# Patient Record
Sex: Female | Born: 1935 | Race: White | Hispanic: No | Smoking: Former smoker
Health system: Southern US, Community
[De-identification: ages and names within clinical notes are randomized; demographics above are authoritative.]

## PROBLEM LIST (undated history)

## (undated) DIAGNOSIS — K219 Gastro-esophageal reflux disease without esophagitis: Secondary | ICD-10-CM

## (undated) DIAGNOSIS — M129 Arthropathy, unspecified: Secondary | ICD-10-CM

## (undated) DIAGNOSIS — H3530 Unspecified macular degeneration: Secondary | ICD-10-CM

## (undated) DIAGNOSIS — H35039 Hypertensive retinopathy, unspecified eye: Secondary | ICD-10-CM

## (undated) DIAGNOSIS — R42 Dizziness and giddiness: Secondary | ICD-10-CM

## (undated) DIAGNOSIS — N183 Chronic kidney disease, stage 3 unspecified: Secondary | ICD-10-CM

## (undated) DIAGNOSIS — I471 Supraventricular tachycardia, unspecified: Secondary | ICD-10-CM

## (undated) DIAGNOSIS — I10 Essential (primary) hypertension: Secondary | ICD-10-CM

## (undated) DIAGNOSIS — G4700 Insomnia, unspecified: Secondary | ICD-10-CM

## (undated) DIAGNOSIS — E039 Hypothyroidism, unspecified: Secondary | ICD-10-CM

## (undated) DIAGNOSIS — I493 Ventricular premature depolarization: Secondary | ICD-10-CM

## (undated) DIAGNOSIS — I491 Atrial premature depolarization: Secondary | ICD-10-CM

## (undated) DIAGNOSIS — R4189 Other symptoms and signs involving cognitive functions and awareness: Secondary | ICD-10-CM

## (undated) DIAGNOSIS — R413 Other amnesia: Secondary | ICD-10-CM

## (undated) DIAGNOSIS — R001 Bradycardia, unspecified: Secondary | ICD-10-CM

## (undated) DIAGNOSIS — M539 Dorsopathy, unspecified: Secondary | ICD-10-CM

## (undated) DIAGNOSIS — S72009A Fracture of unspecified part of neck of unspecified femur, initial encounter for closed fracture: Secondary | ICD-10-CM

## (undated) DIAGNOSIS — E785 Hyperlipidemia, unspecified: Secondary | ICD-10-CM

## (undated) DIAGNOSIS — R002 Palpitations: Secondary | ICD-10-CM

## (undated) DIAGNOSIS — F329 Major depressive disorder, single episode, unspecified: Secondary | ICD-10-CM

## (undated) DIAGNOSIS — D649 Anemia, unspecified: Secondary | ICD-10-CM

## (undated) DIAGNOSIS — E559 Vitamin D deficiency, unspecified: Secondary | ICD-10-CM

## (undated) HISTORY — DX: Anemia, unspecified: D64.9

## (undated) HISTORY — DX: Arthropathy, unspecified: M12.9

## (undated) HISTORY — PX: CATARACT EXTRACTION: SUR2

## (undated) HISTORY — DX: Fracture of unspecified part of neck of unspecified femur, initial encounter for closed fracture: S72.009A

## (undated) HISTORY — DX: Atrial premature depolarization: I49.1

## (undated) HISTORY — DX: Hypothyroidism, unspecified: E03.9

## (undated) HISTORY — DX: Insomnia, unspecified: G47.00

## (undated) HISTORY — DX: Other amnesia: R41.3

## (undated) HISTORY — DX: Bradycardia, unspecified: R00.1

## (undated) HISTORY — DX: Major depressive disorder, single episode, unspecified: F32.9

## (undated) HISTORY — PX: C-EYE SURGERY PROCEDURE: 102257504

## (undated) HISTORY — DX: Hyperlipidemia, unspecified: E78.5

## (undated) HISTORY — PX: EYE SURGERY: SHX253

## (undated) HISTORY — DX: Chronic kidney disease, stage 3 unspecified: N18.30

## (undated) HISTORY — DX: Supraventricular tachycardia, unspecified: I47.10

## (undated) HISTORY — DX: Hypertensive retinopathy, unspecified eye: H35.039

## (undated) HISTORY — DX: Palpitations: R00.2

## (undated) HISTORY — DX: Other symptoms and signs involving cognitive functions and awareness: R41.89

## (undated) HISTORY — DX: Gastro-esophageal reflux disease without esophagitis: K21.9

## (undated) HISTORY — DX: Supraventricular tachycardia: I47.1

## (undated) HISTORY — DX: Unspecified macular degeneration: H35.30

## (undated) HISTORY — DX: Dizziness and giddiness: R42

## (undated) HISTORY — DX: Ventricular premature depolarization: I49.3

## (undated) HISTORY — DX: Vitamin D deficiency, unspecified: E55.9

## (undated) HISTORY — DX: Dorsopathy, unspecified: M53.9

---

## 1969-01-03 HISTORY — PX: ABDOMINAL HYSTERECTOMY: SHX81

## 2008-01-04 HISTORY — PX: BLADDER SURGERY: SHX569

## 2013-01-03 HISTORY — PX: OTHER SURGICAL HISTORY: SHX169

## 2013-01-14 DIAGNOSIS — M25519 Pain in unspecified shoulder: Secondary | ICD-10-CM | POA: Diagnosis not present

## 2013-02-07 DIAGNOSIS — Z85828 Personal history of other malignant neoplasm of skin: Secondary | ICD-10-CM | POA: Diagnosis not present

## 2013-02-07 DIAGNOSIS — Z85820 Personal history of malignant melanoma of skin: Secondary | ICD-10-CM | POA: Diagnosis not present

## 2013-04-09 DIAGNOSIS — R7309 Other abnormal glucose: Secondary | ICD-10-CM | POA: Diagnosis not present

## 2013-04-09 DIAGNOSIS — E7800 Pure hypercholesterolemia, unspecified: Secondary | ICD-10-CM | POA: Diagnosis not present

## 2013-04-09 DIAGNOSIS — I10 Essential (primary) hypertension: Secondary | ICD-10-CM | POA: Diagnosis not present

## 2013-04-16 DIAGNOSIS — E7800 Pure hypercholesterolemia, unspecified: Secondary | ICD-10-CM | POA: Diagnosis not present

## 2013-04-16 DIAGNOSIS — E119 Type 2 diabetes mellitus without complications: Secondary | ICD-10-CM | POA: Diagnosis not present

## 2013-04-16 DIAGNOSIS — I10 Essential (primary) hypertension: Secondary | ICD-10-CM | POA: Diagnosis not present

## 2013-05-04 ENCOUNTER — Emergency Department (HOSPITAL_COMMUNITY): Payer: Medicare Other

## 2013-05-04 ENCOUNTER — Encounter (HOSPITAL_COMMUNITY): Payer: Self-pay | Admitting: Emergency Medicine

## 2013-05-04 ENCOUNTER — Inpatient Hospital Stay (HOSPITAL_COMMUNITY)
Admission: EM | Admit: 2013-05-04 | Discharge: 2013-05-07 | DRG: 536 | Disposition: A | Discharge status: Skilled Nursing Facility | Payer: Medicare Other | Attending: Internal Medicine | Admitting: Internal Medicine

## 2013-05-04 DIAGNOSIS — IMO0001 Reserved for inherently not codable concepts without codable children: Secondary | ICD-10-CM | POA: Diagnosis not present

## 2013-05-04 DIAGNOSIS — S8990XA Unspecified injury of unspecified lower leg, initial encounter: Secondary | ICD-10-CM | POA: Diagnosis not present

## 2013-05-04 DIAGNOSIS — S322XXA Fracture of coccyx, initial encounter for closed fracture: Secondary | ICD-10-CM | POA: Diagnosis present

## 2013-05-04 DIAGNOSIS — Z7982 Long term (current) use of aspirin: Secondary | ICD-10-CM | POA: Diagnosis not present

## 2013-05-04 DIAGNOSIS — M79609 Pain in unspecified limb: Secondary | ICD-10-CM | POA: Diagnosis not present

## 2013-05-04 DIAGNOSIS — F411 Generalized anxiety disorder: Secondary | ICD-10-CM | POA: Diagnosis not present

## 2013-05-04 DIAGNOSIS — R002 Palpitations: Secondary | ICD-10-CM

## 2013-05-04 DIAGNOSIS — Z79899 Other long term (current) drug therapy: Secondary | ICD-10-CM

## 2013-05-04 DIAGNOSIS — I4891 Unspecified atrial fibrillation: Secondary | ICD-10-CM | POA: Diagnosis not present

## 2013-05-04 DIAGNOSIS — S32601A Unspecified fracture of right ischium, initial encounter for closed fracture: Secondary | ICD-10-CM | POA: Diagnosis present

## 2013-05-04 DIAGNOSIS — M25473 Effusion, unspecified ankle: Secondary | ICD-10-CM | POA: Diagnosis not present

## 2013-05-04 DIAGNOSIS — R269 Unspecified abnormalities of gait and mobility: Secondary | ICD-10-CM | POA: Diagnosis not present

## 2013-05-04 DIAGNOSIS — K219 Gastro-esophageal reflux disease without esophagitis: Secondary | ICD-10-CM

## 2013-05-04 DIAGNOSIS — S3210XA Unspecified fracture of sacrum, initial encounter for closed fracture: Secondary | ICD-10-CM | POA: Diagnosis present

## 2013-05-04 DIAGNOSIS — S32509A Unspecified fracture of unspecified pubis, initial encounter for closed fracture: Secondary | ICD-10-CM | POA: Diagnosis not present

## 2013-05-04 DIAGNOSIS — E785 Hyperlipidemia, unspecified: Secondary | ICD-10-CM | POA: Diagnosis not present

## 2013-05-04 DIAGNOSIS — S32602A Unspecified fracture of left ischium, initial encounter for closed fracture: Secondary | ICD-10-CM | POA: Diagnosis present

## 2013-05-04 DIAGNOSIS — IMO0002 Reserved for concepts with insufficient information to code with codable children: Secondary | ICD-10-CM | POA: Diagnosis not present

## 2013-05-04 DIAGNOSIS — S32609A Unspecified fracture of unspecified ischium, initial encounter for closed fracture: Secondary | ICD-10-CM | POA: Diagnosis present

## 2013-05-04 DIAGNOSIS — S79919A Unspecified injury of unspecified hip, initial encounter: Secondary | ICD-10-CM | POA: Diagnosis not present

## 2013-05-04 DIAGNOSIS — M25476 Effusion, unspecified foot: Secondary | ICD-10-CM | POA: Diagnosis not present

## 2013-05-04 DIAGNOSIS — S72009A Fracture of unspecified part of neck of unspecified femur, initial encounter for closed fracture: Secondary | ICD-10-CM | POA: Diagnosis not present

## 2013-05-04 DIAGNOSIS — M6281 Muscle weakness (generalized): Secondary | ICD-10-CM | POA: Diagnosis not present

## 2013-05-04 DIAGNOSIS — Z9181 History of falling: Secondary | ICD-10-CM | POA: Diagnosis not present

## 2013-05-04 DIAGNOSIS — S329XXA Fracture of unspecified parts of lumbosacral spine and pelvis, initial encounter for closed fracture: Secondary | ICD-10-CM | POA: Diagnosis present

## 2013-05-04 DIAGNOSIS — Y93K1 Activity, walking an animal: Secondary | ICD-10-CM

## 2013-05-04 DIAGNOSIS — I10 Essential (primary) hypertension: Secondary | ICD-10-CM | POA: Diagnosis present

## 2013-05-04 DIAGNOSIS — M25559 Pain in unspecified hip: Secondary | ICD-10-CM | POA: Diagnosis not present

## 2013-05-04 DIAGNOSIS — S32592A Other specified fracture of left pubis, initial encounter for closed fracture: Secondary | ICD-10-CM | POA: Diagnosis present

## 2013-05-04 DIAGNOSIS — W108XXA Fall (on) (from) other stairs and steps, initial encounter: Secondary | ICD-10-CM | POA: Diagnosis present

## 2013-05-04 DIAGNOSIS — S298XXA Other specified injuries of thorax, initial encounter: Secondary | ICD-10-CM | POA: Diagnosis not present

## 2013-05-04 DIAGNOSIS — S32591A Other specified fracture of right pubis, initial encounter for closed fracture: Secondary | ICD-10-CM | POA: Diagnosis present

## 2013-05-04 HISTORY — DX: Essential (primary) hypertension: I10

## 2013-05-04 HISTORY — DX: Palpitations: R00.2

## 2013-05-04 HISTORY — DX: Gastro-esophageal reflux disease without esophagitis: K21.9

## 2013-05-04 LAB — CBC WITH DIFFERENTIAL/PLATELET
Basophils Absolute: 0 10*3/uL (ref 0.0–0.1)
Basophils Relative: 0 % (ref 0–1)
Eosinophils Absolute: 0.1 10*3/uL (ref 0.0–0.7)
Eosinophils Relative: 1 % (ref 0–5)
HEMATOCRIT: 32.3 % — AB (ref 36.0–46.0)
Hemoglobin: 11 g/dL — ABNORMAL LOW (ref 12.0–15.0)
LYMPHS PCT: 12 % (ref 12–46)
Lymphs Abs: 1.1 10*3/uL (ref 0.7–4.0)
MCH: 30 pg (ref 26.0–34.0)
MCHC: 34.1 g/dL (ref 30.0–36.0)
MCV: 88 fL (ref 78.0–100.0)
Monocytes Absolute: 0.6 10*3/uL (ref 0.1–1.0)
Monocytes Relative: 7 % (ref 3–12)
NEUTROS PCT: 80 % — AB (ref 43–77)
Neutro Abs: 7.4 10*3/uL (ref 1.7–7.7)
PLATELETS: 140 10*3/uL — AB (ref 150–400)
RBC: 3.67 MIL/uL — ABNORMAL LOW (ref 3.87–5.11)
RDW: 13.4 % (ref 11.5–15.5)
WBC: 9.1 10*3/uL (ref 4.0–10.5)

## 2013-05-04 LAB — BASIC METABOLIC PANEL
BUN: 20 mg/dL (ref 6–23)
CHLORIDE: 102 meq/L (ref 96–112)
CO2: 26 meq/L (ref 19–32)
CREATININE: 0.93 mg/dL (ref 0.50–1.10)
Calcium: 9.5 mg/dL (ref 8.4–10.5)
GFR calc non Af Amer: 54 mL/min — ABNORMAL LOW (ref >90)
GFR, EST AFRICAN AMERICAN: 63 mL/min — AB (ref >90)
Glucose, Bld: 100 mg/dL — ABNORMAL HIGH (ref 70–99)
POTASSIUM: 4.1 meq/L (ref 3.7–5.3)
SODIUM: 140 meq/L (ref 137–147)

## 2013-05-04 MED ORDER — HYDROCODONE-ACETAMINOPHEN 5-325 MG PO TABS
1.0000 | ORAL_TABLET | Freq: Four times a day (QID) | ORAL | Status: DC | PRN
  Administered 2013-05-04 – 2013-05-06 (×6): 2 via ORAL
  Filled 2013-05-04 (×7): qty 2

## 2013-05-04 MED ORDER — LISINOPRIL-HYDROCHLOROTHIAZIDE 20-12.5 MG PO TABS: 1.0000 | ORAL_TABLET | Freq: Every day | ORAL | Status: DC

## 2013-05-04 MED ORDER — PANTOPRAZOLE SODIUM 40 MG PO TBEC
80.0000 mg | DELAYED_RELEASE_TABLET | Freq: Every day | ORAL | Status: DC
  Administered 2013-05-05 – 2013-05-07 (×3): 80 mg via ORAL
  Filled 2013-05-04 (×4): qty 2

## 2013-05-04 MED ORDER — HYDROCHLOROTHIAZIDE 12.5 MG PO CAPS
12.5000 mg | ORAL_CAPSULE | Freq: Every day | ORAL | Status: DC
Start: 2013-05-05 — End: 2013-05-07
  Administered 2013-05-05 – 2013-05-07 (×3): 12.5 mg via ORAL
  Filled 2013-05-04 (×3): qty 1

## 2013-05-04 MED ORDER — NIACIN ER (ANTIHYPERLIPIDEMIC) 500 MG PO TBCR
500.0000 mg | EXTENDED_RELEASE_TABLET | Freq: Every day | ORAL | Status: DC
  Administered 2013-05-05 – 2013-05-06 (×3): 500 mg via ORAL
  Filled 2013-05-04 (×4): qty 1

## 2013-05-04 MED ORDER — MORPHINE SULFATE 4 MG/ML IJ SOLN
4.0000 mg | Freq: Once | INTRAMUSCULAR | Status: AC
  Administered 2013-05-04: 4 mg via INTRAVENOUS
  Filled 2013-05-04: qty 1

## 2013-05-04 MED ORDER — LISINOPRIL 20 MG PO TABS
20.0000 mg | ORAL_TABLET | Freq: Every day | ORAL | Status: DC
  Administered 2013-05-05 – 2013-05-07 (×3): 20 mg via ORAL
  Filled 2013-05-04 (×3): qty 1

## 2013-05-04 MED ORDER — COLESEVELAM HCL 3.75 G PO PACK
1.0000 | PACK | Freq: Every day | ORAL | Status: DC
  Administered 2013-05-06 – 2013-05-07 (×2): 1 via ORAL
  Filled 2013-05-04 (×4): qty 1

## 2013-05-04 MED ORDER — COLESEVELAM HCL 3.75 G PO PACK: 1.0000 | PACK | Freq: Every day | ORAL | Status: DC

## 2013-05-04 MED ORDER — MORPHINE SULFATE 2 MG/ML IJ SOLN
0.5000 mg | INTRAMUSCULAR | Status: DC | PRN
  Administered 2013-05-04 – 2013-05-06 (×4): 0.5 mg via INTRAVENOUS
  Filled 2013-05-04 (×4): qty 1

## 2013-05-04 MED ORDER — HEPARIN SODIUM (PORCINE) 5000 UNIT/ML IJ SOLN
5000.0000 [IU] | Freq: Three times a day (TID) | INTRAMUSCULAR | Status: DC
  Administered 2013-05-04 – 2013-05-07 (×8): 5000 [IU] via SUBCUTANEOUS
  Filled 2013-05-04 (×12): qty 1

## 2013-05-04 MED ORDER — ADULT MULTIVITAMIN W/MINERALS CH
1.0000 | ORAL_TABLET | Freq: Every day | ORAL | Status: DC
  Administered 2013-05-05 – 2013-05-07 (×3): 1 via ORAL
  Filled 2013-05-04 (×3): qty 1

## 2013-05-04 MED ORDER — ATENOLOL 50 MG PO TABS
50.0000 mg | ORAL_TABLET | Freq: Every day | ORAL | Status: DC
  Administered 2013-05-06 – 2013-05-07 (×2): 50 mg via ORAL
  Filled 2013-05-04 (×3): qty 1

## 2013-05-04 MED ORDER — CITALOPRAM HYDROBROMIDE 20 MG PO TABS
20.0000 mg | ORAL_TABLET | Freq: Every day | ORAL | Status: DC
  Administered 2013-05-05 – 2013-05-07 (×3): 20 mg via ORAL
  Filled 2013-05-04 (×3): qty 1

## 2013-05-04 MED ORDER — ASPIRIN EC 81 MG PO TBEC
81.0000 mg | DELAYED_RELEASE_TABLET | Freq: Every day | ORAL | Status: DC
  Administered 2013-05-05 – 2013-05-07 (×3): 81 mg via ORAL
  Filled 2013-05-04 (×3): qty 1

## 2013-05-04 NOTE — ED Notes (Signed)
Pt to department via PTAR- pt reports that she was walking a dog around the block. States that she tripped and fell on some steps. Pt reports left groin pain and increased pain with movement. No shortening or rotation noted. Denies any LOC. Bp-110/70 Hr-60 O2-97

## 2013-05-04 NOTE — ED Notes (Signed)
Attempted report 

## 2013-05-04 NOTE — ED Provider Notes (Signed)
CSN: [id number]     Arrival date & time 05/04/13  1652 History   First MD Initiated Contact with Patient 05/04/13 1654     Chief Complaint  Patient presents with  . Groin Pain  . Fall     (Consider location/radiation/quality/duration/timing/severity/associated sxs/prior Treatment) HPI  This is a 80 y.o. female with PMH of A. fib, hypertension, presenting with pain. Onset prior to arrival. Located medial aspect of the left thigh. Persistent. Crampy, paroxysmal. No meds taken. Radiates down the left upper leg. Negative for weakness, numbness, tingling.  Mechanism was mechanical fall from standing. Patient was walking down steps, missed last up. Felt to the left side of her body. Patient did not hit her head, neck, or back. Negative for LOC, amnesia. Positive for minimal blood loss from superficial abrasion to the left lateral malleolus.  Past Medical History  Diagnosis Date  . Hypertension   . A-fib    History reviewed. No pertinent past surgical history. History reviewed. No pertinent family history. History  Substance Use Topics  . Smoking status: Not on file  . Smokeless tobacco: Not on file  . Alcohol Use: Not on file   OB History   Grav Para Term Preterm Abortions TAB SAB Ect Mult Living                 Review of Systems  Constitutional: Negative for fever and chills.  HENT: Negative for facial swelling.   Eyes: Negative for photophobia and pain.  Respiratory: Negative for cough and shortness of breath.   Cardiovascular: Negative for chest pain and leg swelling.  Gastrointestinal: Negative for nausea, vomiting and abdominal pain.  Genitourinary: Negative for dysuria.  Musculoskeletal: Positive for arthralgias.  Skin: Positive for wound (superficial, left ankle). Negative for rash.  Neurological: Negative for seizures.  Hematological: Negative for adenopathy.      Allergies  Codeine  Home Medications   Prior to Admission medications   Not on File   BP  139/46  Pulse 59  Temp(Src) 98.9 F (37.2 C) (Oral)  Resp 18  SpO2 92% Physical Exam  Constitutional: She is oriented to person, place, and time. She appears well-developed and well-nourished. No distress.  HENT:  Head: Normocephalic and atraumatic.  Mouth/Throat: No oropharyngeal exudate.  Eyes: Conjunctivae are normal. Pupils are equal, round, and reactive to light. No scleral icterus.  Neck: Normal range of motion. No tracheal deviation present. No thyromegaly present.  Cardiovascular: Normal rate, regular rhythm and normal heart sounds.  Exam reveals no gallop and no friction rub.   No murmur heard. Pulmonary/Chest: Effort normal and breath sounds normal. No stridor. No respiratory distress. She has no wheezes. She has no rales. She exhibits no tenderness.  Abdominal: Soft. She exhibits no distension and no mass. There is no tenderness. There is no rebound and no guarding.  Musculoskeletal: Normal range of motion. She exhibits no edema.  Positive for tenderness to palpation of the left upper leg, pelvis. Negative for rotation or shortening. Negative for pain to the greater trochanter. Pelvis is stable to AP and lateral compression. Both extremities are neuro vascularly intact  Neurological: She is alert and oriented to person, place, and time.  Skin: Skin is warm and dry. She is not diaphoretic.  Superficial abrasion to left lateral malleolus    ED Course  Procedures (including critical care time) Labs Review Labs Reviewed - No data to display  Imaging Review Dg Chest 1 View  05/04/2013   CLINICAL DATA:  Fall.  EXAM: CHEST - 1 VIEW  COMPARISON:  None.  FINDINGS: Biapical pleural parenchymal thickening noted, as was consistent with scarring. Active disease including granulomatous disease cannot be excluded. No pleural effusion or pneumothorax. Borderline cardiomegaly, normal pulmonary vascularity. No focal bony abnormality.  IMPRESSION: 1. Apical pleural parenchymal thickening. This  is most consistent with scarring. Active apical disease including granulomatous disease cannot be excluded. There are no prior studies for comparison.  2.  No acute bony abnormality.  No pneumothorax.   Electronically Signed   By: Maisie Fus  Register   On: 05/04/2013 19:09   Dg Pelvis 1-2 Views  05/04/2013   CLINICAL DATA:  Fall.  EXAM: PELVIS - 1-2 VIEW  COMPARISON:  None.  FINDINGS: Degenerative changes lumbar spine and both hips. No acute bony or joint abnormality.  IMPRESSION: No acute abnormality.   Electronically Signed   By: Maisie Fus  Register   On: 05/04/2013 19:07   Dg Femur Left  05/04/2013   CLINICAL DATA:  Bilateral groin pain following a fall today.  EXAM: LEFT FEMUR - 2 VIEW  COMPARISON:  None.  FINDINGS: There is no evidence of fracture or other focal bone lesions. Anterior patellar spurs. Soft tissues are unremarkable.  IMPRESSION: No fracture or dislocation.   Electronically Signed   By: Gordan Payment M.D.   On: 05/04/2013 19:08   Dg Femur Right  05/04/2013   CLINICAL DATA:  Bilateral groin pain following a fall today.  EXAM: RIGHT FEMUR - 2 VIEW  COMPARISON:  None.  FINDINGS: There is no evidence of fracture or other focal bone lesions. Anterior patellar spurs. Soft tissues are unremarkable.  IMPRESSION: No fracture or dislocation.   Electronically Signed   By: Gordan Payment M.D.   On: 05/04/2013 19:08   Dg Ankle Complete Left  05/04/2013   CLINICAL DATA:  Left ankle swelling following a fall today.  EXAM: LEFT ANKLE COMPLETE - 3+ VIEW  COMPARISON:  None.  FINDINGS: Posterior soft tissue swelling and mild medial and lateral soft tissue swelling. There is also evidence of an effusion. No fracture or dislocation is seen. Moderate anterior and mild posterior calcaneal spur formation.  IMPRESSION: Soft tissue swelling and effusion without fracture.   Electronically Signed   By: Gordan Payment M.D.   On: 05/04/2013 19:10   Ct Pelvis Wo Contrast  05/04/2013   CLINICAL DATA:  Bilateral groin pain following  a fall.  EXAM: CT PELVIS WITHOUT CONTRAST  TECHNIQUE: Multidetector CT imaging of the pelvis was performed following the standard protocol without intravenous contrast.  COMPARISON:  Pelvis and bilateral femur radiographs obtained earlier today.  FINDINGS: There is a displaced fracture of the left inferior pubic ramus. There is a mildly displaced fracture of the left ischium, involving the anterior column of the acetabulum. There are also essentially nondisplaced fractures of the right inferior pubic ramus and right ischium as well as the right pubic body. There is also a mildly displaced fracture of the mid to left sacrum, anteriorly. The soft tissues are unremarkable.  Lower lumbar spine degenerative changes including fragmented posterior spur formation at the L5-S1 level. Atheromatous arterial calcifications. No bladder or intestinal abnormalities are seen. Surgically absent uterus. No adnexal masses or enlarged lymph nodes.  IMPRESSION: Fractures of both inferior pubic rami, both ischia, right pubic body and left sacrum, as described above.   Electronically Signed   By: Gordan Payment M.D.   On: 05/04/2013 21:34     EKG Interpretation   Date/Time:  Saturday May 04 2013 17:15:00 EDT Ventricular Rate:  60 PR Interval:  202 QRS Duration: 99 QT Interval:  440 QTC Calculation: 440 R Axis:   72 Text Interpretation:  Sinus rhythm RSR' in V1 or V2, right VCD or RVH No  old tracing to compare Confirmed by KNAPP  MD-J, JON ([id number]) on 05/04/2013  5:18:40 PM      MDM   Final diagnoses:  None    This is a 80 y.o. female with PMH of A. fib, hypertension, presenting with pain. Onset prior to arrival. Located medial aspect of the left thigh. Persistent. Crampy, paroxysmal. No meds taken. Radiates down the left upper leg. Negative for weakness, numbness, tingling.  Mechanism was mechanical fall from standing. Patient was walking down steps, missed last up. Felt to the left side of her body. Patient did not  hit her head, neck, or back. Negative for LOC, amnesia. Positive for minimal blood loss from superficial abrasion to the left lateral malleolus.   X-ray of the chest is without acute bony abnormality or pneumothorax. Femur, pelvis x-rays are without acute traumatic injury.  Patient is still in significant pain. Considering age, likely osteoporosis, trauma, concerning pain, I believe that CT scan of the pelvis is indicated to rule out fracture. Will followup results.  CT of the pelvis reveals fractures of both inferior pubic rami, both fascia, right pubic body and left sacrum. Patient reports good analgesia with morphine. Have contacted the hospitalist service for admission. Bilateral lower extremities remained neurovascularly intact.    I've consulted orthopedic surgery.  I appreciate their recommendations.  Patient is being admitted in stable condition.  I have discussed case and care has been guided by my attending physician, Dr. Lynelle DoctorKnapp.    Loma BostonStirling Danne Scardina, MD 05/04/13 [phone]

## 2013-05-04 NOTE — H&P (Signed)
[facility] History and Physical  Tracey Rosario ZOX:[id number] DOB: 1926-03-10 DOA: 05/04/2013  Referring physician: EDP PCP: No PCP Per Patient   Chief Complaint: Fall   HPI: Tracey Rosario is a 80 y.o. female with PMH of HTN and A.Fib who presents with pelvic pain after a fall.  Pain is persistent since the fall, worst is the medial aspect of the left thigh.  Fall occurred when walking down steps, she was walking her dog, she missed the last step and fell on to the left side of her body.  Did not hit head nor neck.  No LOC  Review of Systems: Systems reviewed.  As above, otherwise negative  Past Medical History  Diagnosis Date  . Hypertension   . A-fib    History reviewed. No pertinent past surgical history. Social History:  has no tobacco, alcohol, and drug history on file.  Allergies  Allergen Reactions  . Codeine Other (See Comments)    Makes her crazy    History reviewed. No pertinent family history.   Prior to Admission medications   Medication Sig Start Date End Date Taking? Authorizing Provider  aspirin 81 MG tablet Take 81 mg by mouth daily.   Yes Historical Provider, MD  atenolol (TENORMIN) 50 MG tablet Take 50 mg by mouth daily.   Yes Historical Provider, MD  citalopram (CELEXA) 20 MG tablet Take 20 mg by mouth daily.   Yes Historical Provider, MD  Colesevelam HCl [facility]) 3.75 G PACK Take 1 packet by mouth daily.   Yes Historical Provider, MD  esomeprazole (NEXIUM) 40 MG capsule Take 40 mg by mouth daily at 12 noon.   Yes Historical Provider, MD  lisinopril-hydrochlorothiazide (PRINZIDE,ZESTORETIC) 20-12.5 MG per tablet Take 1 tablet by mouth daily.   Yes Historical Provider, MD  Multiple Vitamin (MULTIVITAMIN WITH MINERALS) TABS tablet Take 1 tablet by mouth daily.   Yes Historical Provider, MD  niacin (NIASPAN) 500 MG CR tablet Take 500 mg by mouth at bedtime.   Yes Historical Provider, MD  Vitamin D, Ergocalciferol, (DRISDOL) 50000 UNITS CAPS capsule  Take 50,000 Units by mouth every 7 (seven) days.   Yes Historical Provider, MD   Physical Exam: Filed Vitals:   05/04/13 2200  BP: 148/43  Pulse: 61  Temp:   Resp:     BP 148/43  Pulse 61  Temp(Src) 98.9 F (37.2 C) (Oral)  Resp 18  SpO2 98%  General Appearance:    Alert, oriented, no distress, appears stated age  Head:    Normocephalic, atraumatic  Eyes:    PERRL, EOMI, sclera non-icteric        Nose:   Nares without drainage or epistaxis. Mucosa, turbinates normal  Throat:   Moist mucous membranes. Oropharynx without erythema or exudate.  Neck:   Supple. No carotid bruits.  No thyromegaly.  No lymphadenopathy.   Back:     No CVA tenderness, no spinal tenderness  Lungs:     Clear to auscultation bilaterally, without wheezes, rhonchi or rales  Chest wall:    No tenderness to palpitation  Heart:    Regular rate and rhythm without murmurs, gallops, rubs  Abdomen:     Soft, non-tender, nondistended, normal bowel sounds, no organomegaly  Genitalia:    deferred  Rectal:    deferred  Extremities:   No clubbing, cyanosis or edema.  Pulses:   2+ and symmetric all extremities  Skin:   Skin color, texture, turgor normal, no rashes or lesions  Lymph nodes:  Cervical, supraclavicular, and axillary nodes normal  Neurologic:   CNII-XII intact. Normal strength, sensation and reflexes      throughout    Labs on Admission:  Basic Metabolic Panel:  Recent Labs Lab 05/04/13 1850  NA 140  K 4.1  CL 102  CO2 26  GLUCOSE 100*  BUN 20  CREATININE 0.93  CALCIUM 9.5   Liver Function Tests: No results found for this basename: AST, ALT, ALKPHOS, BILITOT, PROT, ALBUMIN,  in the last 168 hours No results found for this basename: LIPASE, AMYLASE,  in the last 168 hours No results found for this basename: AMMONIA,  in the last 168 hours CBC:  Recent Labs Lab 05/04/13 1850  WBC 9.1  NEUTROABS 7.4  HGB 11.0*  HCT 32.3*  MCV 88.0  PLT 140*   Cardiac Enzymes: No results found  for this basename: CKTOTAL, CKMB, CKMBINDEX, TROPONINI,  in the last 168 hours  BNP (last 3 results) No results found for this basename: PROBNP,  in the last 8760 hours CBG: No results found for this basename: GLUCAP,  in the last 168 hours  Radiological Exams on Admission: Dg Chest 1 View  05/04/2013   CLINICAL DATA:  Fall.  EXAM: CHEST - 1 VIEW  COMPARISON:  None.  FINDINGS: Biapical pleural parenchymal thickening noted, as was consistent with scarring. Active disease including granulomatous disease cannot be excluded. No pleural effusion or pneumothorax. Borderline cardiomegaly, normal pulmonary vascularity. No focal bony abnormality.  IMPRESSION: 1. Apical pleural parenchymal thickening. This is most consistent with scarring. Active apical disease including granulomatous disease cannot be excluded. There are no prior studies for comparison.  2.  No acute bony abnormality.  No pneumothorax.   Electronically Signed   By: Maisie Fushomas  Register   On: 05/04/2013 19:09   Dg Pelvis 1-2 Views  05/04/2013   CLINICAL DATA:  Fall.  EXAM: PELVIS - 1-2 VIEW  COMPARISON:  None.  FINDINGS: Degenerative changes lumbar spine and both hips. No acute bony or joint abnormality.  IMPRESSION: No acute abnormality.   Electronically Signed   By: Maisie Fushomas  Register   On: 05/04/2013 19:07   Dg Femur Left  05/04/2013   CLINICAL DATA:  Bilateral groin pain following a fall today.  EXAM: LEFT FEMUR - 2 VIEW  COMPARISON:  None.  FINDINGS: There is no evidence of fracture or other focal bone lesions. Anterior patellar spurs. Soft tissues are unremarkable.  IMPRESSION: No fracture or dislocation.   Electronically Signed   By: Gordan PaymentSteve  Reid M.D.   On: 05/04/2013 19:08   Dg Femur Right  05/04/2013   CLINICAL DATA:  Bilateral groin pain following a fall today.  EXAM: RIGHT FEMUR - 2 VIEW  COMPARISON:  None.  FINDINGS: There is no evidence of fracture or other focal bone lesions. Anterior patellar spurs. Soft tissues are unremarkable.   IMPRESSION: No fracture or dislocation.   Electronically Signed   By: Gordan PaymentSteve  Reid M.D.   On: 05/04/2013 19:08   Dg Ankle Complete Left  05/04/2013   CLINICAL DATA:  Left ankle swelling following a fall today.  EXAM: LEFT ANKLE COMPLETE - 3+ VIEW  COMPARISON:  None.  FINDINGS: Posterior soft tissue swelling and mild medial and lateral soft tissue swelling. There is also evidence of an effusion. No fracture or dislocation is seen. Moderate anterior and mild posterior calcaneal spur formation.  IMPRESSION: Soft tissue swelling and effusion without fracture.   Electronically Signed   By: Gordan PaymentSteve  Reid M.D.   On:  05/04/2013 19:10   Ct Pelvis Wo Contrast  05/04/2013   CLINICAL DATA:  Bilateral groin pain following a fall.  EXAM: CT PELVIS WITHOUT CONTRAST  TECHNIQUE: Multidetector CT imaging of the pelvis was performed following the standard protocol without intravenous contrast.  COMPARISON:  Pelvis and bilateral femur radiographs obtained earlier today.  FINDINGS: There is a displaced fracture of the left inferior pubic ramus. There is a mildly displaced fracture of the left ischium, involving the anterior column of the acetabulum. There are also essentially nondisplaced fractures of the right inferior pubic ramus and right ischium as well as the right pubic body. There is also a mildly displaced fracture of the mid to left sacrum, anteriorly. The soft tissues are unremarkable.  Lower lumbar spine degenerative changes including fragmented posterior spur formation at the L5-S1 level. Atheromatous arterial calcifications. No bladder or intestinal abnormalities are seen. Surgically absent uterus. No adnexal masses or enlarged lymph nodes.  IMPRESSION: Fractures of both inferior pubic rami, both ischia, right pubic body and left sacrum, as described above.   Electronically Signed   By: Gordan PaymentSteve  Reid M.D.   On: 05/04/2013 21:34    EKG: Independently reviewed.  Assessment/Plan Principal Problem:   Pelvic  fracture Active Problems:   Fracture of left inferior pubic ramus   Fracture of right inferior pubic ramus   Left ischial fracture   Right ischial fracture   Sacral fracture   1. Pelvic fractures - Multiple pelvic fractures as noted in assessment and plan.  Admitting, using the hip protocol for pain management, giving meds.  Ortho consult pending but EDP states they doubt that this will be operative so letting her eat in the mean time.    Code Status: Full Code  Family Communication: Daughter at bedside Disposition Plan: Admit to inpatient   Time spent: 50 min  Hillary BowJared M Zaina Jenkin [facility] Pager [phone]  If 7AM-7PM, please contact the day team taking care of the patient [email or web address] Password [email or web address] 05/04/2013, 10:31 PM

## 2013-05-04 NOTE — ED Notes (Signed)
Dr. Clearance CootsHarper made aware of patients pain

## 2013-05-05 DIAGNOSIS — S329XXA Fracture of unspecified parts of lumbosacral spine and pelvis, initial encounter for closed fracture: Secondary | ICD-10-CM

## 2013-05-05 NOTE — Evaluation (Signed)
Physical Therapy Evaluation Patient Details Name: Tracey Rosario MRN: [id number] DOB: 1926-05-22 Today's Date: 05/05/2013   History of Present Illness  Tracey Fillersancy Berkheimer is a 80 y.o. female with PMH of HTN and A.Fib who presents with pelvic pain after a fall. Pt suffered bil pelvic ring fx.   Clinical Impression  Pt adm due to above. Pt presents with decreased independence with mobility secondary to deficits indicated below. Pt to benefit from skilled acute PT to address deficits and maximize functional mobility prior to D/C from acute setting. At this time, recommend SNF for post acute rehab due to decreased caregiver (A).  If pt were to progress to supervision for mobility and transfers, may be able to D/C with daughter.    Follow Up Recommendations SNF    Equipment Recommendations  Other (comment) (TBD at next venue )    Recommendations for Other Services OT consult     Precautions / Restrictions Precautions Precautions: Fall Precaution Comments: reports this instance to be only fall  Restrictions Weight Bearing Restrictions: Yes Other Position/Activity Restrictions: can fully WB if tolerated per MD orders      Mobility  Bed Mobility Overal bed mobility: Needs Assistance Bed Mobility: Supine to Sit;Sit to Supine     Supine to sit: Mod assist;HOB elevated Sit to supine: Min assist   General bed mobility comments: incr (A) to bring hips and LEs off bed; pt was able to increase (A) when returning to supine position; max cues for hand placement and sequencing; incr time required due to pain   Transfers Overall transfer level: Needs assistance Equipment used: Rolling walker (2 wheeled) Transfers: Sit to/from Stand Sit to Stand: Mod assist;From elevated surface         General transfer comment: pt limited ability to WB through Lt LE due to incr pain; requires incr time; mod (A) to achieve upright standing position and maintain balance; max cues for hand placement and  sequencing  Ambulation/Gait             General Gait Details: unable to assess today due to pain  Stairs            Wheelchair Mobility    Modified Rankin (Stroke Patients Only)       Balance Overall balance assessment: Needs assistance Sitting-balance support: Feet supported;Single extremity supported Sitting balance-Leahy Scale: Poor Sitting balance - Comments: requires UE support and unable to weightshift due to pain; pt tolerated sitting EOB ~11 min    Standing balance support: During functional activity;Bilateral upper extremity supported Standing balance-Leahy Scale: Poor Standing balance comment: requires (A) to maintain balance and bil UEs supported by RW                              Pertinent Vitals/Pain "20/10 with mobility"     Home Living Family/patient expects to be discharged to:: Private residence Living Arrangements: Children Available Help at Discharge: Family;Available 24 hours/day Type of Home: House Home Access: Stairs to enter Entrance Stairs-Rails: None Entrance Stairs-Number of Steps: 1 Home Layout: One level Home Equipment: None      Prior Function Level of Independence: Independent               Hand Dominance   Dominant Hand: Right    Extremity/Trunk Assessment   Upper Extremity Assessment: Defer to OT evaluation           Lower Extremity Assessment: RLE deficits/detail;LLE deficits/detail RLE Deficits / Details:  incr tremors due to pain LLE Deficits / Details: incr tremors due to pain  Cervical / Trunk Assessment: Kyphotic  Communication   Communication: No difficulties  Cognition Arousal/Alertness: Awake/alert Behavior During Therapy: Anxious Overall Cognitive Status: Within Functional Limits for tasks assessed                      General Comments General comments (skin integrity, edema, etc.): pt with incr tremors when pain is incr; max cues for breathing     Exercises General  Exercises - Lower Extremity Ankle Circles/Pumps: AROM;Both;10 reps;Supine      Assessment/Plan    PT Assessment Patient needs continued PT services  PT Diagnosis Difficulty walking;Generalized weakness;Acute pain   PT Problem List Decreased strength;Decreased range of motion;Decreased activity tolerance;Decreased balance;Decreased mobility;Decreased knowledge of use of DME;Pain  PT Treatment Interventions DME instruction;Gait training;Therapeutic activities;Therapeutic exercise;Functional mobility training;Balance training;Neuromuscular re-education;Patient/family education   PT Goals (Current goals can be found in the Care Plan section) Acute Rehab PT Goals Patient Stated Goal: to get back to being independent  PT Goal Formulation: With patient Time For Goal Achievement: 05/12/13 Potential to Achieve Goals: Good    Frequency Min 3X/week   Barriers to discharge Decreased caregiver support lives alone ; daugher planning to have surgery tuesday so she cannot physically (A) mother at this time     Co-evaluation               End of Session Equipment Utilized During Treatment: Gait belt;Oxygen Activity Tolerance: Patient limited by pain Patient left: in bed;with call bell/phone within reach;with family/visitor present Nurse Communication: Mobility status;Precautions         Time: [id number] PT Time Calculation (min): 32 min   Charges:   PT Evaluation $Initial PT Evaluation Tier I: 1 Procedure PT Treatments $Therapeutic Activity: 23-37 mins   PT G CodesNadara Mustard:          [place] [address]nt [address] [place], [place]PT  [phone] 05/05/2013, 4:48 PM

## 2013-05-05 NOTE — Progress Notes (Signed)
[facility] PROGRESS NOTE  Tracey Rosario NWG:[id number] DOB: [birth year] DOA: 05/04/2013 PCP: No PCP Per Patient  Assessment/Plan: 1. Pelvic fracture 1. Secondary to mechanical fall 2. Orthopedic surgery consulted 3. Stable fractures that will do well w/ time with recs for PT/OT 2. HTN 1. Stable and controlled 3. Afib 1. Rate controlled 2. Cont current regimen 4. DVT prophylaxis 1. Heparin subQ  Code Status: Full Family Communication: Pt in room Disposition Plan: Pending   Consultants:  Orthopedic surgery  HPI/Subjective: Complains of hip pain. No other issues noted  Objective: Filed Vitals:   05/04/13 2230 05/04/13 2334 05/05/13 0545 05/05/13 0910  BP: 139/46 151/63 109/48 118/44  Pulse: 59 66 54 51  Temp:  99.1 F (37.3 C) 98.3 F (36.8 C)   TempSrc:  Oral Oral   Resp:  18 18   SpO2: 92% 93% 97%     Intake/Output Summary (Last 24 hours) at 05/05/13 0919 Last data filed at 05/05/13 0500  Gross per 24 hour  Intake      0 ml  Output    300 ml  Net   -300 ml   There were no vitals filed for this visit.  Exam:   General:  Awake, in nad  Cardiovascular: regular, s1, s2  Respiratory: normal resp effort, no wheezing  Abdomen: soft,nondistended  Musculoskeletal: perfused, no clubbing   Data Reviewed: Basic Metabolic Panel:  Recent Labs Lab 05/04/13 1850  NA 140  K 4.1  CL 102  CO2 26  GLUCOSE 100*  BUN 20  CREATININE 0.93  CALCIUM 9.5   Liver Function Tests: No results found for this basename: AST, ALT, ALKPHOS, BILITOT, PROT, ALBUMIN,  in the last 168 hours No results found for this basename: LIPASE, AMYLASE,  in the last 168 hours No results found for this basename: AMMONIA,  in the last 168 hours CBC:  Recent Labs Lab 05/04/13 1850  WBC 9.1  NEUTROABS 7.4  HGB 11.0*  HCT 32.3*  MCV 88.0  PLT 140*   Cardiac Enzymes: No results found for this basename: CKTOTAL, CKMB, CKMBINDEX, TROPONINI,  in the last 168 hours BNP  (last 3 results) No results found for this basename: PROBNP,  in the last 8760 hours CBG: No results found for this basename: GLUCAP,  in the last 168 hours  No results found for this or any previous visit (from the past 240 hour(s)).   Studies: Dg Chest 1 View  05/04/2013   CLINICAL DATA:  Fall.  EXAM: CHEST - 1 VIEW  COMPARISON:  None.  FINDINGS: Biapical pleural parenchymal thickening noted, as was consistent with scarring. Active disease including granulomatous disease cannot be excluded. No pleural effusion or pneumothorax. Borderline cardiomegaly, normal pulmonary vascularity. No focal bony abnormality.  IMPRESSION: 1. Apical pleural parenchymal thickening. This is most consistent with scarring. Active apical disease including granulomatous disease cannot be excluded. There are no prior studies for comparison.  2.  No acute bony abnormality.  No pneumothorax.   Electronically Signed   By: Maisie Fushomas  Register   On: 05/04/2013 19:09   Dg Pelvis 1-2 Views  05/04/2013   CLINICAL DATA:  Fall.  EXAM: PELVIS - 1-2 VIEW  COMPARISON:  None.  FINDINGS: Degenerative changes lumbar spine and both hips. No acute bony or joint abnormality.  IMPRESSION: No acute abnormality.   Electronically Signed   By: Maisie Fushomas  Register   On: 05/04/2013 19:07   Dg Femur Left  05/04/2013   CLINICAL DATA:  Bilateral groin pain following a  fall today.  EXAM: LEFT FEMUR - 2 VIEW  COMPARISON:  None.  FINDINGS: There is no evidence of fracture or other focal bone lesions. Anterior patellar spurs. Soft tissues are unremarkable.  IMPRESSION: No fracture or dislocation.   Electronically Signed   By: Gordan PaymentSteve  Reid M.D.   On: 05/04/2013 19:08   Dg Femur Right  05/04/2013   CLINICAL DATA:  Bilateral groin pain following a fall today.  EXAM: RIGHT FEMUR - 2 VIEW  COMPARISON:  None.  FINDINGS: There is no evidence of fracture or other focal bone lesions. Anterior patellar spurs. Soft tissues are unremarkable.  IMPRESSION: No fracture or  dislocation.   Electronically Signed   By: Gordan PaymentSteve  Reid M.D.   On: 05/04/2013 19:08   Dg Ankle Complete Left  05/04/2013   CLINICAL DATA:  Left ankle swelling following a fall today.  EXAM: LEFT ANKLE COMPLETE - 3+ VIEW  COMPARISON:  None.  FINDINGS: Posterior soft tissue swelling and mild medial and lateral soft tissue swelling. There is also evidence of an effusion. No fracture or dislocation is seen. Moderate anterior and mild posterior calcaneal spur formation.  IMPRESSION: Soft tissue swelling and effusion without fracture.   Electronically Signed   By: Gordan PaymentSteve  Reid M.D.   On: 05/04/2013 19:10   Ct Pelvis Wo Contrast  05/04/2013   CLINICAL DATA:  Bilateral groin pain following a fall.  EXAM: CT PELVIS WITHOUT CONTRAST  TECHNIQUE: Multidetector CT imaging of the pelvis was performed following the standard protocol without intravenous contrast.  COMPARISON:  Pelvis and bilateral femur radiographs obtained earlier today.  FINDINGS: There is a displaced fracture of the left inferior pubic ramus. There is a mildly displaced fracture of the left ischium, involving the anterior column of the acetabulum. There are also essentially nondisplaced fractures of the right inferior pubic ramus and right ischium as well as the right pubic body. There is also a mildly displaced fracture of the mid to left sacrum, anteriorly. The soft tissues are unremarkable.  Lower lumbar spine degenerative changes including fragmented posterior spur formation at the L5-S1 level. Atheromatous arterial calcifications. No bladder or intestinal abnormalities are seen. Surgically absent uterus. No adnexal masses or enlarged lymph nodes.  IMPRESSION: Fractures of both inferior pubic rami, both ischia, right pubic body and left sacrum, as described above.   Electronically Signed   By: Gordan PaymentSteve  Reid M.D.   On: 05/04/2013 21:34    Scheduled Meds: . aspirin EC  81 mg Oral Daily  . atenolol  50 mg Oral Daily  . citalopram  20 mg Oral Daily  .  Colesevelam HCl  1 packet Oral Daily  . heparin  5,000 Units Subcutaneous 3 times per day  . hydrochlorothiazide  12.5 mg Oral Daily  . lisinopril  20 mg Oral Daily  . multivitamin with minerals  1 tablet Oral Daily  . niacin  500 mg Oral QHS  . pantoprazole  80 mg Oral Q1200   Continuous Infusions:   Principal Problem:   Pelvic fracture Active Problems:   Fracture of left inferior pubic ramus   Fracture of right inferior pubic ramus   Left ischial fracture   Right ischial fracture   Sacral fracture  Time spent: 40min  Jerald KiefStephen K Chiu  [facility] Pager [phone]. If 7PM-7AM, please contact night-coverage at [email or web address], password [email or web address] 05/05/2013, 9:19 AM  LOS: 1 day

## 2013-05-05 NOTE — Consult Note (Signed)
Reason for Consult:  Pelvic fractures Referring Physician: EDP  Tracey Rosario is an 80 y.o. female.  HPI:   80 yo female who sustained an accidental mechanical fall.  Was admitted to the Medical Service due to pelvic ring fractures and the associated pain and mobility issues as a result.  Orthopedic consulted for recs.  Past Medical History  Diagnosis Date  . Hypertension   . A-fib     History reviewed. No pertinent past surgical history.  History reviewed. No pertinent family history.  Social History:  has no tobacco, alcohol, and drug history on file.  Allergies:  Allergies  Allergen Reactions  . Codeine Other (See Comments)    Makes her crazy    Medications: I have reviewed the patient's current medications.  Results for orders placed during the hospital encounter of 05/04/13 (from the past 48 hour(s))  CBC WITH DIFFERENTIAL     Status: Abnormal   Collection Time    05/04/13  6:50 PM      Result Value Ref Range   WBC 9.1  4.0 - 10.5 K/uL   RBC 3.67 (*) 3.87 - 5.11 MIL/uL   Hemoglobin 11.0 (*) 12.0 - 15.0 g/dL   HCT 32.3 (*) 36.0 - 46.0 %   MCV 88.0  78.0 - 100.0 fL   MCH 30.0  26.0 - 34.0 pg   MCHC 34.1  30.0 - 36.0 g/dL   RDW 13.4  11.5 - 15.5 %   Platelets 140 (*) 150 - 400 K/uL   Neutrophils Relative % 80 (*) 43 - 77 %   Neutro Abs 7.4  1.7 - 7.7 K/uL   Lymphocytes Relative 12  12 - 46 %   Lymphs Abs 1.1  0.7 - 4.0 K/uL   Monocytes Relative 7  3 - 12 %   Monocytes Absolute 0.6  0.1 - 1.0 K/uL   Eosinophils Relative 1  0 - 5 %   Eosinophils Absolute 0.1  0.0 - 0.7 K/uL   Basophils Relative 0  0 - 1 %   Basophils Absolute 0.0  0.0 - 0.1 K/uL  BASIC METABOLIC PANEL     Status: Abnormal   Collection Time    05/04/13  6:50 PM      Result Value Ref Range   Sodium 140  137 - 147 mEq/L   Potassium 4.1  3.7 - 5.3 mEq/L   Chloride 102  96 - 112 mEq/L   CO2 26  19 - 32 mEq/L   Glucose, Bld 100 (*) 70 - 99 mg/dL   BUN 20  6 - 23 mg/dL   Creatinine, Ser 0.93   0.50 - 1.10 mg/dL   Calcium 9.5  8.4 - 10.5 mg/dL   GFR calc non Af Amer 54 (*) >90 mL/min   GFR calc Af Amer 63 (*) >90 mL/min   Comment: (NOTE)     The eGFR has been calculated using the CKD EPI equation.     This calculation has not been validated in all clinical situations.     eGFR's persistently <90 mL/min signify possible Chronic Kidney     Disease.    Dg Chest 1 View  05/04/2013   CLINICAL DATA:  Fall.  EXAM: CHEST - 1 VIEW  COMPARISON:  None.  FINDINGS: Biapical pleural parenchymal thickening noted, as was consistent with scarring. Active disease including granulomatous disease cannot be excluded. No pleural effusion or pneumothorax. Borderline cardiomegaly, normal pulmonary vascularity. No focal bony abnormality.  IMPRESSION: 1. Apical pleural parenchymal  thickening. This is most consistent with scarring. Active apical disease including granulomatous disease cannot be excluded. There are no prior studies for comparison.  2.  No acute bony abnormality.  No pneumothorax.   Electronically Signed   By: Marcello Moores  Register   On: 05/04/2013 19:09   Dg Pelvis 1-2 Views  05/04/2013   CLINICAL DATA:  Fall.  EXAM: PELVIS - 1-2 VIEW  COMPARISON:  None.  FINDINGS: Degenerative changes lumbar spine and both hips. No acute bony or joint abnormality.  IMPRESSION: No acute abnormality.   Electronically Signed   By: Marcello Moores  Register   On: 05/04/2013 19:07   Dg Femur Left  05/04/2013   CLINICAL DATA:  Bilateral groin pain following a fall today.  EXAM: LEFT FEMUR - 2 VIEW  COMPARISON:  None.  FINDINGS: There is no evidence of fracture or other focal bone lesions. Anterior patellar spurs. Soft tissues are unremarkable.  IMPRESSION: No fracture or dislocation.   Electronically Signed   By: Enrique Sack M.D.   On: 05/04/2013 19:08   Dg Femur Right  05/04/2013   CLINICAL DATA:  Bilateral groin pain following a fall today.  EXAM: RIGHT FEMUR - 2 VIEW  COMPARISON:  None.  FINDINGS: There is no evidence of fracture or  other focal bone lesions. Anterior patellar spurs. Soft tissues are unremarkable.  IMPRESSION: No fracture or dislocation.   Electronically Signed   By: Enrique Sack M.D.   On: 05/04/2013 19:08   Dg Ankle Complete Left  05/04/2013   CLINICAL DATA:  Left ankle swelling following a fall today.  EXAM: LEFT ANKLE COMPLETE - 3+ VIEW  COMPARISON:  None.  FINDINGS: Posterior soft tissue swelling and mild medial and lateral soft tissue swelling. There is also evidence of an effusion. No fracture or dislocation is seen. Moderate anterior and mild posterior calcaneal spur formation.  IMPRESSION: Soft tissue swelling and effusion without fracture.   Electronically Signed   By: Enrique Sack M.D.   On: 05/04/2013 19:10   Ct Pelvis Wo Contrast  05/04/2013   CLINICAL DATA:  Bilateral groin pain following a fall.  EXAM: CT PELVIS WITHOUT CONTRAST  TECHNIQUE: Multidetector CT imaging of the pelvis was performed following the standard protocol without intravenous contrast.  COMPARISON:  Pelvis and bilateral femur radiographs obtained earlier today.  FINDINGS: There is a displaced fracture of the left inferior pubic ramus. There is a mildly displaced fracture of the left ischium, involving the anterior column of the acetabulum. There are also essentially nondisplaced fractures of the right inferior pubic ramus and right ischium as well as the right pubic body. There is also a mildly displaced fracture of the mid to left sacrum, anteriorly. The soft tissues are unremarkable.  Lower lumbar spine degenerative changes including fragmented posterior spur formation at the L5-S1 level. Atheromatous arterial calcifications. No bladder or intestinal abnormalities are seen. Surgically absent uterus. No adnexal masses or enlarged lymph nodes.  IMPRESSION: Fractures of both inferior pubic rami, both ischia, right pubic body and left sacrum, as described above.   Electronically Signed   By: Enrique Sack M.D.   On: 05/04/2013 21:34     ROS Blood pressure 109/48, pulse 54, temperature 98.3 F (36.8 C), temperature source Oral, resp. rate 18, SpO2 97.00%. Physical Exam  Musculoskeletal:       Right hip: She exhibits bony tenderness.       Left hip: She exhibits bony tenderness.   Her pelvis is stable to AP and Lat compression,  but painful. She has pain to palpation over her pubis. Both legs have normal motor/sensory function and both feet are well-perfused   Assessment/Plan: Stable pelvic ring fractures following a mechanical fall (Pubis fx, left sacral ala fx, bilateral root of ramus fx, bilateral inf pubic rami fx)  These are stable fractures that will do well with time.  She will certainly have pain associated with the fractures.  From an orthopedic standpoint, she can sit up as tolerated and attempt full weight bearing as tolerated, though it may be difficult secondary to her pain.  Will order PT/OT.  Mcarthur Rossetti 05/05/2013, 8:12 AM

## 2013-05-05 NOTE — ED Provider Notes (Signed)
I saw and evaluated the patient, reviewed the resident's note and I agree with the findings and plan.  Pt with ground level fall.  Initial plain films normal.  CT scan shows several pelvic fractures.  Stable fractures but patient has severe pain.  Plan on orthopedic consultation with medical admission.   EKG Interpretation   Date/Time:  Saturday May 04 2013 17:15:00 EDT Ventricular Rate:  60 PR Interval:  202 QRS Duration: 99 QT Interval:  440 QTC Calculation: 440 R Axis:   72 Text Interpretation:  Sinus rhythm RSR' in V1 or V2, right VCD or RVH No  old tracing to compare Confirmed by Arnet Hofferber  MD-J, Hunner Garcon [phone]([id number]) on 05/04/2013  5:18:40 PM        Celene KrasJon R Mackenzi Krogh, MD 05/05/13 1517

## 2013-05-06 MED ORDER — HYDROCODONE-ACETAMINOPHEN 5-325 MG PO TABS: 1.0000 | ORAL_TABLET | ORAL | Status: DC | PRN

## 2013-05-06 MED ORDER — HYDROCODONE-ACETAMINOPHEN 5-325 MG PO TABS
1.0000 | ORAL_TABLET | ORAL | Status: DC | PRN
  Administered 2013-05-06: 1 via ORAL
  Administered 2013-05-06 – 2013-05-07 (×2): 2 via ORAL
  Filled 2013-05-06 (×3): qty 2

## 2013-05-06 MED ORDER — METHOCARBAMOL 500 MG PO TABS
500.0000 mg | ORAL_TABLET | Freq: Four times a day (QID) | ORAL | Status: DC | PRN
  Administered 2013-05-06 – 2013-05-07 (×3): 500 mg via ORAL
  Filled 2013-05-06 (×3): qty 1

## 2013-05-06 MED ORDER — METHOCARBAMOL 500 MG PO TABS: 500.0000 mg | ORAL_TABLET | Freq: Four times a day (QID) | ORAL | Status: DC | PRN

## 2013-05-06 NOTE — Progress Notes (Signed)
Physical Therapy Treatment Patient Details Name: Tracey Rosario Ransom MRN: [id number] DOB: [birth year] Today's Date: 05/06/2013    History of Present Illness Tracey Rosario Muzyka is a 80 y.o. female with PMH of HTN and A.Fib who presents with pelvic pain after a fall. Pt suffered bil pelvic ring fx.     PT Comments    Patient able to transfer to recliner this session. Having a difficult time getting comfortable in any position. Encouraged to attempt sitting for at least an hour. Continue to recommend SNF for ongoing Physical Therapy.     Follow Up Recommendations  SNF     Equipment Recommendations       Recommendations for Other Services       Precautions / Restrictions Precautions Precautions: Fall Precaution Comments: reports this instance to be only fall  Restrictions Other Position/Activity Restrictions: can fully WB if tolerated per MD orders    Mobility  Bed Mobility Overal bed mobility: Needs Assistance       Supine to sit: Mod assist;HOB elevated     General bed mobility comments: Use of pad to bring hips to edge of bed. Patient able to assist with use of UEs and rails. A for trunk into sitting and positioning  Transfers Overall transfer level: Needs assistance Equipment used: Rolling walker (2 wheeled) Transfers: Stand Pivot Transfers Sit to Stand: From elevated surface;Min assist Stand pivot transfers: Mod assist       General transfer comment: A for anterior weight shift into standing position. Cues for safe hand placement and to control descent into recliner. Patient putting toe on ground intially but able to bear more weight as SPT progressed. SPT x2.   Ambulation/Gait                 Stairs            Wheelchair Mobility    Modified Rankin (Stroke Patients Only)       Balance                                    Cognition Arousal/Alertness: Awake/alert Behavior During Therapy: WFL for tasks assessed/performed Overall  Cognitive Status: Within Functional Limits for tasks assessed                      Exercises      General Comments        Pertinent Vitals/Pain 8/10 L leg pain. patient repositioned for comfort Patient was premedicated    Home Living                      Prior Function            PT Goals (current goals can now be found in the care plan section) Progress towards PT goals: Progressing toward goals    Frequency  Min 3X/week    PT Plan Current plan remains appropriate    Co-evaluation             End of Session Equipment Utilized During Treatment: Gait belt;Oxygen Activity Tolerance: Patient limited by pain;Patient tolerated treatment well Patient left: with call bell/phone within reach;with family/visitor present;in chair     Time: [id number] PT Time Calculation (min): 25 min  Charges:  $Therapeutic Activity: 23-37 mins                    G Codes:  Adline PotterJulia Elizabeth Steve Gregg 05/06/2013, 2:59 PM  05/06/2013 Adline PotterJulia Elizabeth Marishka Rentfrow PTA [phone] pager [phone] office

## 2013-05-06 NOTE — Progress Notes (Signed)
Patient ID: Tracey Rosario, female   DOB: May 29, 1926, 80 y.o.   MRN: [id number] Awake and alert.  Was able to sit up and attempt mobility with therapy yesterday.  Understands that she will likely need SNF placement short-term.  Bilateral LE stable.  Pelvis stable, but painful.  From Ortho standpoint, can follow-up in 2 weeks.

## 2013-05-06 NOTE — Progress Notes (Signed)
[facility] PROGRESS NOTE  Tracey Rosario ZOX:[id number] DOB: 17-Jan-1926 DOA: 05/04/2013 PCP: No PCP Per Patient  Assessment/Plan: 1. Pelvic fracture 1. Secondary to mechanical fall 2. Orthopedic surgery consulted 3. Stable fractures that will do well w/ time with recs for PT/OT 4. Recs for SNF - discussed with SW 2. HTN 1. Stable and well controlled 3. Afib 1. Rate controlled 2. Cont current regimen 4. DVT prophylaxis 1. Heparin subQ while inpt  Code Status: Full Family Communication: Pt in room Disposition Plan: Pending SNF, possibly tomorrow   Consultants:  Orthopedic surgery  HPI/Subjective: Complains of mild hip pain, worse with movement  Objective: Filed Vitals:   05/05/13 1600 05/05/13 2143 05/06/13 0120 05/06/13 0553  BP:  118/34 127/42 113/37  Pulse:  60  57  Temp:  98.1 F (36.7 C)  98.7 F (37.1 C)  TempSrc:  Oral  Oral  Resp: 18 18  18   SpO2: 98% 95%  95%    Intake/Output Summary (Last 24 hours) at 05/06/13 1034 Last data filed at 05/06/13 [id number]  Gross per 24 hour  Intake    240 ml  Output    225 ml  Net     15 ml   There were no vitals filed for this visit.  Exam:   General:  Awake, in nad  Cardiovascular: regular, s1, s2  Respiratory: normal resp effort, no wheezing  Abdomen: soft,nondistended  Musculoskeletal: perfused, no clubbing   Data Reviewed: Basic Metabolic Panel:  Recent Labs Lab 05/04/13 1850  NA 140  K 4.1  CL 102  CO2 26  GLUCOSE 100*  BUN 20  CREATININE 0.93  CALCIUM 9.5   Liver Function Tests: No results found for this basename: AST, ALT, ALKPHOS, BILITOT, PROT, ALBUMIN,  in the last 168 hours No results found for this basename: LIPASE, AMYLASE,  in the last 168 hours No results found for this basename: AMMONIA,  in the last 168 hours CBC:  Recent Labs Lab 05/04/13 1850  WBC 9.1  NEUTROABS 7.4  HGB 11.0*  HCT 32.3*  MCV 88.0  PLT 140*   Cardiac Enzymes: No results found for this  basename: CKTOTAL, CKMB, CKMBINDEX, TROPONINI,  in the last 168 hours BNP (last 3 results) No results found for this basename: PROBNP,  in the last 8760 hours CBG: No results found for this basename: GLUCAP,  in the last 168 hours  No results found for this or any previous visit (from the past 240 hour(s)).   Studies: Dg Chest 1 View  05/04/2013   CLINICAL DATA:  Fall.  EXAM: CHEST - 1 VIEW  COMPARISON:  None.  FINDINGS: Biapical pleural parenchymal thickening noted, as was consistent with scarring. Active disease including granulomatous disease cannot be excluded. No pleural effusion or pneumothorax. Borderline cardiomegaly, normal pulmonary vascularity. No focal bony abnormality.  IMPRESSION: 1. Apical pleural parenchymal thickening. This is most consistent with scarring. Active apical disease including granulomatous disease cannot be excluded. There are no prior studies for comparison.  2.  No acute bony abnormality.  No pneumothorax.   Electronically Signed   By: Maisie Fushomas  Register   On: 05/04/2013 19:09   Dg Pelvis 1-2 Views  05/04/2013   CLINICAL DATA:  Fall.  EXAM: PELVIS - 1-2 VIEW  COMPARISON:  None.  FINDINGS: Degenerative changes lumbar spine and both hips. No acute bony or joint abnormality.  IMPRESSION: No acute abnormality.   Electronically Signed   By: Maisie Fushomas  Register   On: 05/04/2013 19:07   Dg  Femur Left  05/04/2013   CLINICAL DATA:  Bilateral groin pain following a fall today.  EXAM: LEFT FEMUR - 2 VIEW  COMPARISON:  None.  FINDINGS: There is no evidence of fracture or other focal bone lesions. Anterior patellar spurs. Soft tissues are unremarkable.  IMPRESSION: No fracture or dislocation.   Electronically Signed   By: Gordan PaymentSteve  Reid M.D.   On: 05/04/2013 19:08   Dg Femur Right  05/04/2013   CLINICAL DATA:  Bilateral groin pain following a fall today.  EXAM: RIGHT FEMUR - 2 VIEW  COMPARISON:  None.  FINDINGS: There is no evidence of fracture or other focal bone lesions. Anterior patellar  spurs. Soft tissues are unremarkable.  IMPRESSION: No fracture or dislocation.   Electronically Signed   By: Gordan PaymentSteve  Reid M.D.   On: 05/04/2013 19:08   Dg Ankle Complete Left  05/04/2013   CLINICAL DATA:  Left ankle swelling following a fall today.  EXAM: LEFT ANKLE COMPLETE - 3+ VIEW  COMPARISON:  None.  FINDINGS: Posterior soft tissue swelling and mild medial and lateral soft tissue swelling. There is also evidence of an effusion. No fracture or dislocation is seen. Moderate anterior and mild posterior calcaneal spur formation.  IMPRESSION: Soft tissue swelling and effusion without fracture.   Electronically Signed   By: Gordan PaymentSteve  Reid M.D.   On: 05/04/2013 19:10   Ct Pelvis Wo Contrast  05/04/2013   CLINICAL DATA:  Bilateral groin pain following a fall.  EXAM: CT PELVIS WITHOUT CONTRAST  TECHNIQUE: Multidetector CT imaging of the pelvis was performed following the standard protocol without intravenous contrast.  COMPARISON:  Pelvis and bilateral femur radiographs obtained earlier today.  FINDINGS: There is a displaced fracture of the left inferior pubic ramus. There is a mildly displaced fracture of the left ischium, involving the anterior column of the acetabulum. There are also essentially nondisplaced fractures of the right inferior pubic ramus and right ischium as well as the right pubic body. There is also a mildly displaced fracture of the mid to left sacrum, anteriorly. The soft tissues are unremarkable.  Lower lumbar spine degenerative changes including fragmented posterior spur formation at the L5-S1 level. Atheromatous arterial calcifications. No bladder or intestinal abnormalities are seen. Surgically absent uterus. No adnexal masses or enlarged lymph nodes.  IMPRESSION: Fractures of both inferior pubic rami, both ischia, right pubic body and left sacrum, as described above.   Electronically Signed   By: Gordan PaymentSteve  Reid M.D.   On: 05/04/2013 21:34    Scheduled Meds: . aspirin EC  81 mg Oral Daily  .  atenolol  50 mg Oral Daily  . citalopram  20 mg Oral Daily  . Colesevelam HCl  1 packet Oral Daily  . heparin  5,000 Units Subcutaneous 3 times per day  . hydrochlorothiazide  12.5 mg Oral Daily  . lisinopril  20 mg Oral Daily  . multivitamin with minerals  1 tablet Oral Daily  . niacin  500 mg Oral QHS  . pantoprazole  80 mg Oral Q1200   Continuous Infusions:   Principal Problem:   Pelvic fracture Active Problems:   Fracture of left inferior pubic ramus   Fracture of right inferior pubic ramus   Left ischial fracture   Right ischial fracture   Sacral fracture  Time spent: 40min  Jerald KiefStephen K Morio Widen  [facility] Pager [phone]. If 7PM-7AM, please contact night-coverage at [email or web address], password [email or web address] 05/06/2013, 10:34 AM  LOS: 2 days

## 2013-05-06 NOTE — Discharge Instructions (Signed)
Increase activities slowly as comfort allows. Full weight bearing as tolerated using a walker.

## 2013-05-06 NOTE — Progress Notes (Signed)
CSW Proofreader(Clinical Social Worker) spoke with pt and pt daughter at bedside. [facility]. Full assessment to follow.  Ieshia Hatcher, LCSWA [phone]

## 2013-05-06 NOTE — Progress Notes (Signed)
OT Cancellation Note  Patient Details Name: Tracey Rosario MRN: [id number] DOB: Feb 13, 1926   Cancelled Treatment:    Reason Eval/Treat Not Completed: Other (comment) Pt is Medicare and current D/C plan is SNF. No apparent immediate acute care OT needs, therefore will defer OT to SNF. If OT eval is needed please call Acute Rehab Dept. at [phone] or text page OT at [phone].    Evette GeorgesCatherine Eva Ocie Tino [phone] 05/06/2013, 7:28 AM

## 2013-05-07 ENCOUNTER — Encounter (HOSPITAL_COMMUNITY): Payer: Self-pay

## 2013-05-07 DIAGNOSIS — IMO0001 Reserved for inherently not codable concepts without codable children: Secondary | ICD-10-CM | POA: Diagnosis not present

## 2013-05-07 DIAGNOSIS — S32509A Unspecified fracture of unspecified pubis, initial encounter for closed fracture: Secondary | ICD-10-CM | POA: Diagnosis not present

## 2013-05-07 DIAGNOSIS — I4891 Unspecified atrial fibrillation: Secondary | ICD-10-CM | POA: Diagnosis not present

## 2013-05-07 DIAGNOSIS — I10 Essential (primary) hypertension: Secondary | ICD-10-CM | POA: Diagnosis not present

## 2013-05-07 DIAGNOSIS — S72009A Fracture of unspecified part of neck of unspecified femur, initial encounter for closed fracture: Secondary | ICD-10-CM | POA: Diagnosis not present

## 2013-05-07 DIAGNOSIS — E785 Hyperlipidemia, unspecified: Secondary | ICD-10-CM | POA: Diagnosis not present

## 2013-05-07 DIAGNOSIS — Z9181 History of falling: Secondary | ICD-10-CM | POA: Diagnosis not present

## 2013-05-07 DIAGNOSIS — F411 Generalized anxiety disorder: Secondary | ICD-10-CM | POA: Diagnosis not present

## 2013-05-07 DIAGNOSIS — K219 Gastro-esophageal reflux disease without esophagitis: Secondary | ICD-10-CM | POA: Diagnosis not present

## 2013-05-07 DIAGNOSIS — S32609A Unspecified fracture of unspecified ischium, initial encounter for closed fracture: Secondary | ICD-10-CM | POA: Diagnosis not present

## 2013-05-07 DIAGNOSIS — D649 Anemia, unspecified: Secondary | ICD-10-CM | POA: Diagnosis not present

## 2013-05-07 DIAGNOSIS — S329XXA Fracture of unspecified parts of lumbosacral spine and pelvis, initial encounter for closed fracture: Secondary | ICD-10-CM | POA: Diagnosis not present

## 2013-05-07 DIAGNOSIS — R269 Unspecified abnormalities of gait and mobility: Secondary | ICD-10-CM | POA: Diagnosis not present

## 2013-05-07 DIAGNOSIS — M25559 Pain in unspecified hip: Secondary | ICD-10-CM | POA: Diagnosis not present

## 2013-05-07 DIAGNOSIS — M6281 Muscle weakness (generalized): Secondary | ICD-10-CM | POA: Diagnosis not present

## 2013-05-07 DIAGNOSIS — IMO0002 Reserved for concepts with insufficient information to code with codable children: Secondary | ICD-10-CM | POA: Diagnosis not present

## 2013-05-07 NOTE — Progress Notes (Signed)
LATE ENTRY  Clinical Social Work Department BRIEF PSYCHOSOCIAL ASSESSMENT 05/07/2013  Patient:  Tracey Rosario,Tracey Rosario     Account Number:  [id number]     Admit date:  05/04/2013  Clinical Social Worker:  Harless NakayamaAMBELAL,Lugene Beougher, LCSWA  Date/Time:  05/06/2013 04:00 PM  Referred by:  Physician  Date Referred:  05/07/2013 Referred for  SNF Placement   Other Referral:   Interview type:  Patient Other interview type:   Spoke with pt and pt daughter at bedside    PSYCHOSOCIAL DATA Living Status:  FAMILY Admitted from facility:   Level of care:   Primary support name:  Tracey Rosario Primary support relationship to patient:  CHILD, ADULT Degree of support available:   Pt has very good support system    CURRENT CONCERNS Current Concerns  Post-Acute Placement   Other Concerns:    SOCIAL WORK ASSESSMENT / PLAN CSW aware of PT recommendation SNF. CSW visited pt room and spoke with pt and pt daughter Tracey Rosario. CSW explained recommendation. Pt and Tracey Rosario already aware. CSW informed them of SNF referral process. Tracey Rosario informed CSW they are not familiar with any facilities and will have to make a decision today because she is having surgery tomorrow and will not be available. CSW informed pt and Tracey Rosario CSW would return this afternoon with bed offers and SNF list with addresses listed. Being able to make decision today is only concern pt and pt daughter have.   Assessment/plan status:  Psychosocial Support/Ongoing Assessment of Needs Other assessment/ plan:   Information/referral to community resources:   SNF list to be provided with bed offers    PATIENT'S/FAMILY'S RESPONSE TO PLAN OF CARE: Pt and pt daughter agreeable and wanting SNF.       Tracey Rosario, LCSWA [phone]

## 2013-05-07 NOTE — Discharge Summary (Signed)
Physician Discharge Summary  Tracey Rosario WUJ:[id number] DOB: 04/16/1926 DOA: 05/04/2013  PCP: No PCP Per Patient  Admit date: 05/04/2013 Discharge date: 05/07/2013  Time spent: 35 minutes  Recommendations for Outpatient Follow-up:  1. Follow up with Orthopedic surgery as scheduled 2. Follow up with PCP in 1-2 weeks after   Discharge Diagnoses:  Principal Problem:   Pelvic fracture Active Problems:   Fracture of left inferior pubic ramus   Fracture of right inferior pubic ramus   Left ischial fracture   Right ischial fracture   Sacral fracture   Discharge Condition: Stable  Diet recommendation: Regular  There were no vitals filed for this visit.  History of present illness:  Tracey Rosario is a 80 y.o. female with PMH of HTN and A.Fib who presents with pelvic pain after a fall. Pain is persistent since the fall, worst is the medial aspect of the left thigh. Fall occurred when walking down steps, she was walking her dog, she missed the last step and fell on to the left side of her body. Did not hit head nor neck. No LOC  Hospital Course:  1. Pelvic fracture  1. Secondary to mechanical fall 2. Orthopedic surgery was consulted 3. Per orthopedic surgery, fractures are stable that will do well w/ time with recs for PT/OT 4. PT recs for SNF 2. HTN  1. Stable and well controlled 3. Afib  1. Rate controlled 2. Cont current regimen 4. DVT prophylaxis  1. Heparin subQ while inpt Consultations:  Orthopedic surgery  Discharge Exam: Filed Vitals:   05/06/13 0553 05/06/13 1300 05/06/13 1947 05/07/13 0526  BP: 113/37 127/48 137/44 136/49  Pulse: 57 61 55 56  Temp: 98.7 F (37.1 C) 97.8 F (36.6 C) 97.4 F (36.3 C) 98.2 F (36.8 C)  TempSrc: Oral  Oral Oral  Resp: 18 18 16 16   SpO2: 95% 96% 97% 96%    General: Awake, in nad Cardiovascular: regular, s1, s2 Respiratory: normal resp effort, no wheezing  Discharge Instructions      Discharge Orders   Future Orders  Complete By Expires   Full weight bearing  As directed    Questions:     Laterality:     Extremity:         Medication List         aspirin 81 MG tablet  Take 81 mg by mouth daily.     atenolol 50 MG tablet  Commonly known as:  TENORMIN  Take 50 mg by mouth daily.     citalopram 20 MG tablet  Commonly known as:  CELEXA  Take 20 mg by mouth daily.     esomeprazole 40 MG capsule  Commonly known as:  NEXIUM  Take 40 mg by mouth daily at 12 noon.     HYDROcodone-acetaminophen 5-325 MG per tablet  Commonly known as:  NORCO/VICODIN  Take 1-2 tablets by mouth every 4 (four) hours as needed for moderate pain.     lisinopril-hydrochlorothiazide 20-12.5 MG per tablet  Commonly known as:  PRINZIDE,ZESTORETIC  Take 1 tablet by mouth daily.     methocarbamol 500 MG tablet  Commonly known as:  ROBAXIN  Take 1 tablet (500 mg total) by mouth every 6 (six) hours as needed for muscle spasms.     multivitamin with minerals Tabs tablet  Take 1 tablet by mouth daily.     niacin 500 MG CR tablet  Commonly known as:  NIASPAN  Take 500 mg by mouth at bedtime.  Vitamin D (Ergocalciferol) 50000 UNITS Caps capsule  Commonly known as:  DRISDOL  Take 50,000 Units by mouth every 7 (seven) days.     WELCHOL 3.75 G Pack  Generic drug:  Colesevelam HCl  Take 1 packet by mouth daily.       Allergies  Allergen Reactions  . Codeine Other (See Comments)    Makes her crazy   Follow-up Information   Follow up with Kathryne HitchBLACKMAN,CHRISTOPHER Y, MD. Schedule an appointment as soon as possible for a visit in 2 weeks.   Specialty:  Orthopedic Surgery   Contact information:   [address] [place] [id number] [phone]       Schedule an appointment as soon as possible for a visit with Follow up with PCP in 1-2 weeks after discharge.       The results of significant diagnostics from this hospitalization (including imaging, microbiology, ancillary and laboratory) are listed below  for reference.    Significant Diagnostic Studies: Dg Chest 1 View  05/04/2013   CLINICAL DATA:  Fall.  EXAM: CHEST - 1 VIEW  COMPARISON:  None.  FINDINGS: Biapical pleural parenchymal thickening noted, as was consistent with scarring. Active disease including granulomatous disease cannot be excluded. No pleural effusion or pneumothorax. Borderline cardiomegaly, normal pulmonary vascularity. No focal bony abnormality.  IMPRESSION: 1. Apical pleural parenchymal thickening. This is most consistent with scarring. Active apical disease including granulomatous disease cannot be excluded. There are no prior studies for comparison.  2.  No acute bony abnormality.  No pneumothorax.   Electronically Signed   By: Maisie Fushomas  Register   On: 05/04/2013 19:09   Dg Pelvis 1-2 Views  05/04/2013   CLINICAL DATA:  Fall.  EXAM: PELVIS - 1-2 VIEW  COMPARISON:  None.  FINDINGS: Degenerative changes lumbar spine and both hips. No acute bony or joint abnormality.  IMPRESSION: No acute abnormality.   Electronically Signed   By: Maisie Fushomas  Register   On: 05/04/2013 19:07   Dg Femur Left  05/04/2013   CLINICAL DATA:  Bilateral groin pain following a fall today.  EXAM: LEFT FEMUR - 2 VIEW  COMPARISON:  None.  FINDINGS: There is no evidence of fracture or other focal bone lesions. Anterior patellar spurs. Soft tissues are unremarkable.  IMPRESSION: No fracture or dislocation.   Electronically Signed   By: Gordan PaymentSteve  Reid M.D.   On: 05/04/2013 19:08   Dg Femur Right  05/04/2013   CLINICAL DATA:  Bilateral groin pain following a fall today.  EXAM: RIGHT FEMUR - 2 VIEW  COMPARISON:  None.  FINDINGS: There is no evidence of fracture or other focal bone lesions. Anterior patellar spurs. Soft tissues are unremarkable.  IMPRESSION: No fracture or dislocation.   Electronically Signed   By: Gordan PaymentSteve  Reid M.D.   On: 05/04/2013 19:08   Dg Ankle Complete Left  05/04/2013   CLINICAL DATA:  Left ankle swelling following a fall today.  EXAM: LEFT ANKLE  COMPLETE - 3+ VIEW  COMPARISON:  None.  FINDINGS: Posterior soft tissue swelling and mild medial and lateral soft tissue swelling. There is also evidence of an effusion. No fracture or dislocation is seen. Moderate anterior and mild posterior calcaneal spur formation.  IMPRESSION: Soft tissue swelling and effusion without fracture.   Electronically Signed   By: Gordan PaymentSteve  Reid M.D.   On: 05/04/2013 19:10   Ct Pelvis Wo Contrast  05/04/2013   CLINICAL DATA:  Bilateral groin pain following a fall.  EXAM: CT PELVIS WITHOUT CONTRAST  TECHNIQUE: Multidetector CT imaging of the pelvis was performed following the standard protocol without intravenous contrast.  COMPARISON:  Pelvis and bilateral femur radiographs obtained earlier today.  FINDINGS: There is a displaced fracture of the left inferior pubic ramus. There is a mildly displaced fracture of the left ischium, involving the anterior column of the acetabulum. There are also essentially nondisplaced fractures of the right inferior pubic ramus and right ischium as well as the right pubic body. There is also a mildly displaced fracture of the mid to left sacrum, anteriorly. The soft tissues are unremarkable.  Lower lumbar spine degenerative changes including fragmented posterior spur formation at the L5-S1 level. Atheromatous arterial calcifications. No bladder or intestinal abnormalities are seen. Surgically absent uterus. No adnexal masses or enlarged lymph nodes.  IMPRESSION: Fractures of both inferior pubic rami, both ischia, right pubic body and left sacrum, as described above.   Electronically Signed   By: Gordan PaymentSteve  Reid M.D.   On: 05/04/2013 21:34    Microbiology: No results found for this or any previous visit (from the past 240 hour(s)).   Labs: Basic Metabolic Panel:  Recent Labs Lab 05/04/13 1850  NA 140  K 4.1  CL 102  CO2 26  GLUCOSE 100*  BUN 20  CREATININE 0.93  CALCIUM 9.5   Liver Function Tests: No results found for this basename: AST,  ALT, ALKPHOS, BILITOT, PROT, ALBUMIN,  in the last 168 hours No results found for this basename: LIPASE, AMYLASE,  in the last 168 hours No results found for this basename: AMMONIA,  in the last 168 hours CBC:  Recent Labs Lab 05/04/13 1850  WBC 9.1  NEUTROABS 7.4  HGB 11.0*  HCT 32.3*  MCV 88.0  PLT 140*   Cardiac Enzymes: No results found for this basename: CKTOTAL, CKMB, CKMBINDEX, TROPONINI,  in the last 168 hours BNP: BNP (last 3 results) No results found for this basename: PROBNP,  in the last 8760 hours CBG: No results found for this basename: GLUCAP,  in the last 168 hours  Signed:  Jerald KiefStephen K Chiu  [facility] 05/07/2013, 11:44 AM

## 2013-05-07 NOTE — Care Management Note (Signed)
CARE MANAGEMENT NOTE 05/07/2013  Patient:  Ardeen FillersOVERSTREET,[place]   Account Number:  [id number]  Date Initiated:  05/07/2013  Documentation initiated by:  Vance PeperBRADY,Gearld Kerstein  Subjective/Objective Assessment:   80 yr old female admitted with multiple pelvic fractures s/p fall     Action/Plan:   Patient to be discharged to SNF for shorterm rehab. Social worker is aware.   Anticipated DC Date:  05/07/2013   Anticipated DC Plan:  HOME W HOME HEALTH SERVICES  In-house referral  Clinical Social Worker      DC Planning Services  CM consult      Choice offered to / List presented to:             Status of service:  Completed, signed off Medicare Important Message given?   (If response is "NO", the following Medicare IM given date fields will be blank) Date Medicare IM given:   Date Additional Medicare IM given:    Discharge Disposition:  HOME W HOME HEALTH SERVICES

## 2013-05-07 NOTE — Progress Notes (Signed)
Clinical Social Work Department CLINICAL SOCIAL WORK PLACEMENT NOTE 05/07/2013  Patient:  Tracey Rosario,Tracey Rosario  Account Number:  [id number] Admit date:  05/04/2013  Clinical Social Worker:  Sharol HarnessPOONUM Lanette Ell, Theresia MajorsLCSWA  Date/time:  05/06/2013 03:00 PM  Clinical Social Work is seeking post-discharge placement for this patient at the following level of care:   SKILLED NURSING   (*CSW will update this form in Epic as items are completed)   05/06/2013  Patient/family provided with [facility] Department of Clinical Social Work's list of facilities offering this level of care within the geographic area requested by the patient (or if unable, by the patient's family).  05/06/2013  Patient/family informed of their freedom to choose among providers that offer the needed level of care, that participate in Medicare, Medicaid or managed care program needed by the patient, have an available bed and are willing to accept the patient.  05/06/2013  Patient/family informed of [facility]' ownership interest in [facility], as well as of the fact that they are under no obligation to receive care at this facility.  PASARR submitted to EDS on 05/06/2013 PASARR number received from EDS on 05/06/2013  FL2 transmitted to all facilities in geographic area requested by pt/family on  05/06/2013 FL2 transmitted to all facilities within larger geographic area on   Patient informed that his/her managed care company has contracts with or will negotiate with  certain facilities, including the following:     Patient/family informed of bed offers received:  05/06/2013 [facility] Physician recommends and patient chooses bed at    [facility]  05/07/2013 [facility]  The following physician request were entered in Epic:   Additional Comments:   [name], LCSWA [phone]

## 2013-05-07 NOTE — Progress Notes (Signed)
CSW (Clinical Social Worker) prepared pt dc packet and placed with shadow chart. CSW arranged non-emergent ambulance transport. Pt, pt family, pt nurse, and facility informed. CSW signing off.  Semisi Biela, LCSWA [phone]  

## 2013-05-08 ENCOUNTER — Encounter: Payer: Self-pay | Admitting: *Deleted

## 2013-05-09 ENCOUNTER — Non-Acute Institutional Stay (SKILLED_NURSING_FACILITY): Payer: Medicare Other | Admitting: Internal Medicine

## 2013-05-09 DIAGNOSIS — I10 Essential (primary) hypertension: Secondary | ICD-10-CM | POA: Diagnosis not present

## 2013-05-09 DIAGNOSIS — S329XXA Fracture of unspecified parts of lumbosacral spine and pelvis, initial encounter for closed fracture: Secondary | ICD-10-CM

## 2013-05-09 DIAGNOSIS — D649 Anemia, unspecified: Secondary | ICD-10-CM

## 2013-05-09 DIAGNOSIS — I4891 Unspecified atrial fibrillation: Secondary | ICD-10-CM | POA: Diagnosis not present

## 2013-05-10 DIAGNOSIS — I10 Essential (primary) hypertension: Secondary | ICD-10-CM | POA: Insufficient documentation

## 2013-05-10 DIAGNOSIS — D649 Anemia, unspecified: Secondary | ICD-10-CM | POA: Insufficient documentation

## 2013-05-10 NOTE — Progress Notes (Signed)
HISTORY & PHYSICAL  DATE: 05/09/2013   FACILITY: [facility]  LEVEL OF CARE: SNF (31)  ALLERGIES:  Allergies  Allergen Reactions  . Codeine Other (See Comments)    Makes her crazy    CHIEF COMPLAINT:  Manage pelvic fracture, atrial fibrillation and hypertension  HISTORY OF PRESENT ILLNESS: 80 year old Caucasian female was hospitalized after a fall with pelvic pain. After hospitalization she is admitted to this facility for short-term rehabilitation.  PELVIC FRACTURE: The patient had a fall and sustained a pelvic fracture. Conservative management was recommended by orthopedic surgery. Patient is admitted to this facility for short-term rehabilitation. The patient denies ongoing pain. No complications reported from the pain medication(s) currently being used.  ATRIAL FIBRILLATION: the patients atrial fibrillation remains stable.  The patient denies DOE, tachycardia, orthopnea, transient neurological sx, pedal edema, palpitations, & PNDs.  No complications noted from the medications currently being used.  HTN: Pt 's HTN remains stable.  Denies CP, sob, DOE, pedal edema, headaches, dizziness or visual disturbances.  No complications from the medications currently being used.  Last BP : 131/59.  PAST MEDICAL HISTORY :  Past Medical History  Diagnosis Date  . Hypertension   . A-fib     PAST SURGICAL HISTORY: None  SOCIAL HISTORY:  reports that she has never smoked. She does not have any smokeless tobacco history on file.  FAMILY HISTORY: None  CURRENT MEDICATIONS: Reviewed per MAR/see medication list  REVIEW OF SYSTEMS:  See HPI otherwise 14 point ROS is negative.  PHYSICAL EXAMINATION  VS:  See VS section  GENERAL: no acute distress, normal body habitus EYES: conjunctivae normal, sclerae normal, normal eye lids MOUTH/THROAT: lips without lesions,no lesions in the mouth,tongue is without lesions,uvula elevates in midline NECK: supple,  trachea midline, no neck masses, no thyroid tenderness, no thyromegaly LYMPHATICS: no LAN in the neck, no supraclavicular LAN RESPIRATORY: breathing is even & unlabored, BS CTAB CARDIAC: Heart rate is irregularly irregular, no murmur,no extra heart sounds, no edema GI:  ABDOMEN: abdomen soft, normal BS, no masses, no tenderness  LIVER/SPLEEN: no hepatomegaly, no splenomegaly MUSCULOSKELETAL: HEAD: normal to inspection & palpation BACK: no kyphosis, scoliosis or spinal processes tenderness EXTREMITIES: LEFT UPPER EXTREMITY: full range of motion, normal strength & tone RIGHT UPPER EXTREMITY:  full range of motion, normal strength & tone LEFT LOWER EXTREMITY:  Limited range of motion, normal strength & tone RIGHT LOWER EXTREMITY:  Limited range of motion, normal strength & tone PSYCHIATRIC: the patient is alert & oriented to person, affect & behavior appropriate  LABS/RADIOLOGY:  Labs reviewed: Basic Metabolic Panel:  Recent Labs  16/10/9603/02/15 1850  NA 140  K 4.1  CL 102  CO2 26  GLUCOSE 100*  BUN 20  CREATININE 0.93  CALCIUM 9.5   CBC:  Recent Labs  05/04/13 1850  WBC 9.1  NEUTROABS 7.4  HGB 11.0*  HCT 32.3*  MCV 88.0  PLT 140*    PELVIS - 1-2 VIEW   COMPARISON:  None.   FINDINGS: Degenerative changes lumbar spine and both hips. No acute bony or joint abnormality.   IMPRESSION: No acute abnormality.   LEFT FEMUR - 2 VIEW   COMPARISON:  None.   FINDINGS: There is no evidence of fracture or other focal bone lesions. Anterior patellar spurs. Soft tissues are unremarkable.   IMPRESSION: No fracture or dislocation. RIGHT FEMUR - 2 VIEW   COMPARISON:  None.   FINDINGS: There is no evidence of  fracture or other focal bone lesions. Anterior patellar spurs. Soft tissues are unremarkable.   IMPRESSION: No fracture or dislocation.   CHEST - 1 VIEW   COMPARISON:  None.   FINDINGS: Biapical pleural parenchymal thickening noted, as was consistent with  scarring. Active disease including granulomatous disease cannot be excluded. No pleural effusion or pneumothorax. Borderline cardiomegaly, normal pulmonary vascularity. No focal bony abnormality.   IMPRESSION: 1. Apical pleural parenchymal thickening. This is most consistent with scarring. Active apical disease including granulomatous disease cannot be excluded. There are no prior studies for comparison.   2.  No acute bony abnormality.  No pneumothorax.   LEFT ANKLE COMPLETE - 3+ VIEW   COMPARISON:  None.   FINDINGS: Posterior soft tissue swelling and mild medial and lateral soft tissue swelling. There is also evidence of an effusion. No fracture or dislocation is seen. Moderate anterior and mild posterior calcaneal spur formation.   IMPRESSION: Soft tissue swelling and effusion without fracture.   CT PELVIS WITHOUT CONTRAST   TECHNIQUE: Multidetector CT imaging of the pelvis was performed following the standard protocol without intravenous contrast.   COMPARISON:  Pelvis and bilateral femur radiographs obtained earlier today.   FINDINGS: There is a displaced fracture of the left inferior pubic ramus. There is a mildly displaced fracture of the left ischium, involving the anterior column of the acetabulum. There are also essentially nondisplaced fractures of the right inferior pubic ramus and right ischium as well as the right pubic body. There is also a mildly displaced fracture of the mid to left sacrum, anteriorly. The soft tissues are unremarkable.   Lower lumbar spine degenerative changes including fragmented posterior spur formation at the L5-S1 level. Atheromatous arterial calcifications. No bladder or intestinal abnormalities are seen. Surgically absent uterus. No adnexal masses or enlarged lymph nodes.   IMPRESSION: Fractures of both inferior pubic rami, both ischia, right pubic body and left sacrum, as described above.    ASSESSMENT/PLAN:  Pelvic  fracture-continue rehabilitation Hypertension-well-controlled Atrial fibrillation-rate controlled  anemia-recheck  GERD-continue PPI Check CBC  I have reviewed patient's medical records received at admission/from hospitalization.  CPT CODE: [id number]  Tracey CoxGayani Y Lyndsi Altic, MD [facility] [phone]

## 2013-05-20 DIAGNOSIS — S32509A Unspecified fracture of unspecified pubis, initial encounter for closed fracture: Secondary | ICD-10-CM | POA: Diagnosis not present

## 2013-06-03 ENCOUNTER — Encounter: Payer: Self-pay | Admitting: *Deleted

## 2013-06-04 ENCOUNTER — Non-Acute Institutional Stay (SKILLED_NURSING_FACILITY): Payer: Medicare Other | Admitting: Adult Health

## 2013-06-04 ENCOUNTER — Encounter: Payer: Self-pay | Admitting: Adult Health

## 2013-06-04 DIAGNOSIS — I4891 Unspecified atrial fibrillation: Secondary | ICD-10-CM | POA: Diagnosis not present

## 2013-06-04 DIAGNOSIS — S329XXA Fracture of unspecified parts of lumbosacral spine and pelvis, initial encounter for closed fracture: Secondary | ICD-10-CM | POA: Diagnosis not present

## 2013-06-04 DIAGNOSIS — G4700 Insomnia, unspecified: Secondary | ICD-10-CM

## 2013-06-04 DIAGNOSIS — I10 Essential (primary) hypertension: Secondary | ICD-10-CM | POA: Diagnosis not present

## 2013-06-04 DIAGNOSIS — D649 Anemia, unspecified: Secondary | ICD-10-CM

## 2013-06-04 DIAGNOSIS — E785 Hyperlipidemia, unspecified: Secondary | ICD-10-CM

## 2013-06-04 NOTE — Progress Notes (Signed)
Patient ID: Tracey Rosario, female   DOB: [birth year], 80 y.o.   MRN: [id number]              PROGRESS NOTE  DATE: 06/04/2013   FACILITY: [facility]  LEVEL OF CARE: SNF (31)  Acute Visit  CHIEF COMPLAINT:  Discharge Notes  HISTORY OF PRESENT ILLNESS: This is an 80 year old female who is for discharge home with Home health PT, OT and Nursing. DME: Bedside commode and rolling walker. She has been admitted to [facility] on 05/07/13 from [facility] with Pelvic fracture sustained from a fall at home. Patient was admitted to this facility for short-term rehabilitation after the patient's recent hospitalization.  Patient has completed SNF rehabilitation and therapy has cleared the patient for discharge.  Reassessment of ongoing problem(s):  HTN: Pt 's HTN remains stable.  Denies CP, sob, DOE, pedal edema, headaches, dizziness or visual disturbances.  No complications from the medications currently being used.  Last BP : 130/64  ATRIAL FIBRILLATION: the patients atrial fibrillation remains stable.  The patient denies DOE, tachycardia, orthopnea, transient neurological sx, pedal edema, palpitations, & PNDs.  No complications noted from the medications currently being used.  DEPRESSION: The depression remains stable. Patient denies ongoing feelings of sadness, insomnia, anedhonia or lack of appetite. No complications reported from the medications currently being used. Staff do not report behavioral problems.  PAST MEDICAL HISTORY : Reviewed.  No changes/see problem list  CURRENT MEDICATIONS: Reviewed per MAR/see medication list  REVIEW OF SYSTEMS:  GENERAL: no change in appetite, no fatigue, no weight changes, no fever, chills or weakness RESPIRATORY: no cough, SOB, DOE, wheezing, hemoptysis CARDIAC: no chest pain, edema or palpitations GI: no abdominal pain, diarrhea, constipation, heart burn, nausea or vomiting  PHYSICAL EXAMINATION  GENERAL: no acute  distress, normal body habitus EYES: conjunctivae normal, sclerae normal, normal eye lids NECK: supple, trachea midline, no neck masses, no thyroid tenderness, no thyromegaly LYMPHATICS: no LAN in the neck, no supraclavicular LAN RESPIRATORY: breathing is even & unlabored, BS CTAB CARDIAC: irregularly irregular, no murmur,no extra heart sounds, no edema GI: abdomen soft, normal BS, no masses, no tenderness, no hepatomegaly, no splenomegaly EXTREMITIES: able to move all 4 extremities; ambulates with walker PSYCHIATRIC: the patient is alert & oriented to person, affect & behavior appropriate  LABS/RADIOLOGY: 05/10/13  WBC 5.8 hemoglobin 9.9 hematocrit 30.5 Labs reviewed: Basic Metabolic Panel:  Recent Labs  34/03/70 1850  NA 140  K 4.1  CL 102  CO2 26  GLUCOSE 100*  BUN 20  CREATININE 0.93  CALCIUM 9.5   CBC:  Recent Labs  05/04/13 1850  WBC 9.1  NEUTROABS 7.4  HGB 11.0*  HCT 32.3*  MCV 88.0  PLT 140*    ASSESSMENT/PLAN:  Pelvic fracture - for home health PT, OT and nursing Hypertension - well controlled; continue atenolol and Zestoretic Depression - stable; continue Celexa Hyperlipidemia - continue WelChol Anemia - stable Atrial fibrillation - rate controlled; continue atenolol and aspirin Insomnia - continue Ambien   I have filled out patient's discharge paperwork and written prescriptions.  Patient will receive home health PT, OT and Nursing.   DME provided:  Bedside commode and rolling walker  Total discharge time: Greater than 30 minutes  Discharge time involved coordination of the discharge process with social worker, nursing staff and therapy department. Medical justification for home health services/DME verified.  CPT CODE: 96438  Ella Bodo - NP [facility] [phone]

## 2013-06-06 DIAGNOSIS — IMO0001 Reserved for inherently not codable concepts without codable children: Secondary | ICD-10-CM | POA: Diagnosis not present

## 2013-06-06 DIAGNOSIS — M25559 Pain in unspecified hip: Secondary | ICD-10-CM

## 2013-06-06 DIAGNOSIS — S32609A Unspecified fracture of unspecified ischium, initial encounter for closed fracture: Secondary | ICD-10-CM | POA: Diagnosis not present

## 2013-06-06 DIAGNOSIS — I4891 Unspecified atrial fibrillation: Secondary | ICD-10-CM | POA: Diagnosis not present

## 2013-06-06 DIAGNOSIS — I10 Essential (primary) hypertension: Secondary | ICD-10-CM | POA: Diagnosis not present

## 2013-06-06 DIAGNOSIS — S32509A Unspecified fracture of unspecified pubis, initial encounter for closed fracture: Secondary | ICD-10-CM

## 2013-06-06 DIAGNOSIS — Z9181 History of falling: Secondary | ICD-10-CM | POA: Diagnosis not present

## 2013-06-10 DIAGNOSIS — S32609A Unspecified fracture of unspecified ischium, initial encounter for closed fracture: Secondary | ICD-10-CM | POA: Diagnosis not present

## 2013-06-10 DIAGNOSIS — Z9181 History of falling: Secondary | ICD-10-CM | POA: Diagnosis not present

## 2013-06-10 DIAGNOSIS — M25559 Pain in unspecified hip: Secondary | ICD-10-CM | POA: Diagnosis not present

## 2013-06-10 DIAGNOSIS — S32509A Unspecified fracture of unspecified pubis, initial encounter for closed fracture: Secondary | ICD-10-CM | POA: Diagnosis not present

## 2013-06-10 DIAGNOSIS — I4891 Unspecified atrial fibrillation: Secondary | ICD-10-CM | POA: Diagnosis not present

## 2013-06-10 DIAGNOSIS — IMO0001 Reserved for inherently not codable concepts without codable children: Secondary | ICD-10-CM | POA: Diagnosis not present

## 2013-06-11 DIAGNOSIS — S32509A Unspecified fracture of unspecified pubis, initial encounter for closed fracture: Secondary | ICD-10-CM | POA: Diagnosis not present

## 2013-06-11 DIAGNOSIS — Z9181 History of falling: Secondary | ICD-10-CM | POA: Diagnosis not present

## 2013-06-11 DIAGNOSIS — S32609A Unspecified fracture of unspecified ischium, initial encounter for closed fracture: Secondary | ICD-10-CM | POA: Diagnosis not present

## 2013-06-11 DIAGNOSIS — I4891 Unspecified atrial fibrillation: Secondary | ICD-10-CM | POA: Diagnosis not present

## 2013-06-11 DIAGNOSIS — IMO0001 Reserved for inherently not codable concepts without codable children: Secondary | ICD-10-CM | POA: Diagnosis not present

## 2013-06-11 DIAGNOSIS — M25559 Pain in unspecified hip: Secondary | ICD-10-CM | POA: Diagnosis not present

## 2013-06-13 DIAGNOSIS — IMO0001 Reserved for inherently not codable concepts without codable children: Secondary | ICD-10-CM | POA: Diagnosis not present

## 2013-06-13 DIAGNOSIS — M25559 Pain in unspecified hip: Secondary | ICD-10-CM | POA: Diagnosis not present

## 2013-06-13 DIAGNOSIS — S32509A Unspecified fracture of unspecified pubis, initial encounter for closed fracture: Secondary | ICD-10-CM | POA: Diagnosis not present

## 2013-06-13 DIAGNOSIS — S32609A Unspecified fracture of unspecified ischium, initial encounter for closed fracture: Secondary | ICD-10-CM | POA: Diagnosis not present

## 2013-06-13 DIAGNOSIS — Z9181 History of falling: Secondary | ICD-10-CM | POA: Diagnosis not present

## 2013-06-13 DIAGNOSIS — I4891 Unspecified atrial fibrillation: Secondary | ICD-10-CM | POA: Diagnosis not present

## 2013-06-14 DIAGNOSIS — I4891 Unspecified atrial fibrillation: Secondary | ICD-10-CM | POA: Diagnosis not present

## 2013-06-14 DIAGNOSIS — S32509A Unspecified fracture of unspecified pubis, initial encounter for closed fracture: Secondary | ICD-10-CM | POA: Diagnosis not present

## 2013-06-14 DIAGNOSIS — Z9181 History of falling: Secondary | ICD-10-CM | POA: Diagnosis not present

## 2013-06-14 DIAGNOSIS — M25559 Pain in unspecified hip: Secondary | ICD-10-CM | POA: Diagnosis not present

## 2013-06-14 DIAGNOSIS — S32609A Unspecified fracture of unspecified ischium, initial encounter for closed fracture: Secondary | ICD-10-CM | POA: Diagnosis not present

## 2013-06-14 DIAGNOSIS — IMO0001 Reserved for inherently not codable concepts without codable children: Secondary | ICD-10-CM | POA: Diagnosis not present

## 2013-06-17 DIAGNOSIS — S32609A Unspecified fracture of unspecified ischium, initial encounter for closed fracture: Secondary | ICD-10-CM | POA: Diagnosis not present

## 2013-06-17 DIAGNOSIS — I4891 Unspecified atrial fibrillation: Secondary | ICD-10-CM | POA: Diagnosis not present

## 2013-06-17 DIAGNOSIS — Z9181 History of falling: Secondary | ICD-10-CM | POA: Diagnosis not present

## 2013-06-17 DIAGNOSIS — IMO0001 Reserved for inherently not codable concepts without codable children: Secondary | ICD-10-CM | POA: Diagnosis not present

## 2013-06-17 DIAGNOSIS — M25559 Pain in unspecified hip: Secondary | ICD-10-CM | POA: Diagnosis not present

## 2013-06-17 DIAGNOSIS — S32509A Unspecified fracture of unspecified pubis, initial encounter for closed fracture: Secondary | ICD-10-CM | POA: Diagnosis not present

## 2013-06-19 DIAGNOSIS — I4891 Unspecified atrial fibrillation: Secondary | ICD-10-CM | POA: Diagnosis not present

## 2013-06-19 DIAGNOSIS — S32509A Unspecified fracture of unspecified pubis, initial encounter for closed fracture: Secondary | ICD-10-CM | POA: Diagnosis not present

## 2013-06-19 DIAGNOSIS — S32609A Unspecified fracture of unspecified ischium, initial encounter for closed fracture: Secondary | ICD-10-CM | POA: Diagnosis not present

## 2013-06-19 DIAGNOSIS — IMO0001 Reserved for inherently not codable concepts without codable children: Secondary | ICD-10-CM | POA: Diagnosis not present

## 2013-06-19 DIAGNOSIS — Z9181 History of falling: Secondary | ICD-10-CM | POA: Diagnosis not present

## 2013-06-19 DIAGNOSIS — M25559 Pain in unspecified hip: Secondary | ICD-10-CM | POA: Diagnosis not present

## 2013-06-20 DIAGNOSIS — I4891 Unspecified atrial fibrillation: Secondary | ICD-10-CM | POA: Diagnosis not present

## 2013-06-20 DIAGNOSIS — Z9181 History of falling: Secondary | ICD-10-CM | POA: Diagnosis not present

## 2013-06-20 DIAGNOSIS — M25559 Pain in unspecified hip: Secondary | ICD-10-CM | POA: Diagnosis not present

## 2013-06-20 DIAGNOSIS — S32609A Unspecified fracture of unspecified ischium, initial encounter for closed fracture: Secondary | ICD-10-CM | POA: Diagnosis not present

## 2013-06-20 DIAGNOSIS — S32509A Unspecified fracture of unspecified pubis, initial encounter for closed fracture: Secondary | ICD-10-CM | POA: Diagnosis not present

## 2013-06-20 DIAGNOSIS — IMO0001 Reserved for inherently not codable concepts without codable children: Secondary | ICD-10-CM | POA: Diagnosis not present

## 2013-06-24 DIAGNOSIS — I4891 Unspecified atrial fibrillation: Secondary | ICD-10-CM | POA: Diagnosis not present

## 2013-06-24 DIAGNOSIS — S32609A Unspecified fracture of unspecified ischium, initial encounter for closed fracture: Secondary | ICD-10-CM | POA: Diagnosis not present

## 2013-06-24 DIAGNOSIS — M25559 Pain in unspecified hip: Secondary | ICD-10-CM | POA: Diagnosis not present

## 2013-06-24 DIAGNOSIS — IMO0001 Reserved for inherently not codable concepts without codable children: Secondary | ICD-10-CM | POA: Diagnosis not present

## 2013-06-24 DIAGNOSIS — Z9181 History of falling: Secondary | ICD-10-CM | POA: Diagnosis not present

## 2013-06-24 DIAGNOSIS — S32509A Unspecified fracture of unspecified pubis, initial encounter for closed fracture: Secondary | ICD-10-CM | POA: Diagnosis not present

## 2013-06-26 DIAGNOSIS — M25559 Pain in unspecified hip: Secondary | ICD-10-CM | POA: Diagnosis not present

## 2013-06-26 DIAGNOSIS — I4891 Unspecified atrial fibrillation: Secondary | ICD-10-CM | POA: Diagnosis not present

## 2013-06-26 DIAGNOSIS — S32509A Unspecified fracture of unspecified pubis, initial encounter for closed fracture: Secondary | ICD-10-CM | POA: Diagnosis not present

## 2013-06-26 DIAGNOSIS — IMO0001 Reserved for inherently not codable concepts without codable children: Secondary | ICD-10-CM | POA: Diagnosis not present

## 2013-06-26 DIAGNOSIS — Z9181 History of falling: Secondary | ICD-10-CM | POA: Diagnosis not present

## 2013-06-26 DIAGNOSIS — S32609A Unspecified fracture of unspecified ischium, initial encounter for closed fracture: Secondary | ICD-10-CM | POA: Diagnosis not present

## 2013-07-01 DIAGNOSIS — Z9181 History of falling: Secondary | ICD-10-CM | POA: Diagnosis not present

## 2013-07-01 DIAGNOSIS — I4891 Unspecified atrial fibrillation: Secondary | ICD-10-CM | POA: Diagnosis not present

## 2013-07-01 DIAGNOSIS — IMO0001 Reserved for inherently not codable concepts without codable children: Secondary | ICD-10-CM | POA: Diagnosis not present

## 2013-07-01 DIAGNOSIS — S32609A Unspecified fracture of unspecified ischium, initial encounter for closed fracture: Secondary | ICD-10-CM | POA: Diagnosis not present

## 2013-07-01 DIAGNOSIS — M25559 Pain in unspecified hip: Secondary | ICD-10-CM | POA: Diagnosis not present

## 2013-07-01 DIAGNOSIS — S32509A Unspecified fracture of unspecified pubis, initial encounter for closed fracture: Secondary | ICD-10-CM | POA: Diagnosis not present

## 2013-07-02 DIAGNOSIS — S32509A Unspecified fracture of unspecified pubis, initial encounter for closed fracture: Secondary | ICD-10-CM | POA: Diagnosis not present

## 2013-07-03 ENCOUNTER — Other Ambulatory Visit: Payer: Self-pay | Admitting: Adult Health

## 2013-07-03 DIAGNOSIS — S32509A Unspecified fracture of unspecified pubis, initial encounter for closed fracture: Secondary | ICD-10-CM | POA: Diagnosis not present

## 2013-07-03 DIAGNOSIS — IMO0001 Reserved for inherently not codable concepts without codable children: Secondary | ICD-10-CM | POA: Diagnosis not present

## 2013-07-03 DIAGNOSIS — M25559 Pain in unspecified hip: Secondary | ICD-10-CM | POA: Diagnosis not present

## 2013-07-03 DIAGNOSIS — I4891 Unspecified atrial fibrillation: Secondary | ICD-10-CM | POA: Diagnosis not present

## 2013-07-03 DIAGNOSIS — Z9181 History of falling: Secondary | ICD-10-CM | POA: Diagnosis not present

## 2013-07-03 DIAGNOSIS — S32609A Unspecified fracture of unspecified ischium, initial encounter for closed fracture: Secondary | ICD-10-CM | POA: Diagnosis not present

## 2013-07-08 DIAGNOSIS — I4891 Unspecified atrial fibrillation: Secondary | ICD-10-CM | POA: Diagnosis not present

## 2013-07-08 DIAGNOSIS — S32509A Unspecified fracture of unspecified pubis, initial encounter for closed fracture: Secondary | ICD-10-CM | POA: Diagnosis not present

## 2013-07-08 DIAGNOSIS — S32609A Unspecified fracture of unspecified ischium, initial encounter for closed fracture: Secondary | ICD-10-CM | POA: Diagnosis not present

## 2013-07-08 DIAGNOSIS — M25559 Pain in unspecified hip: Secondary | ICD-10-CM | POA: Diagnosis not present

## 2013-07-08 DIAGNOSIS — IMO0001 Reserved for inherently not codable concepts without codable children: Secondary | ICD-10-CM | POA: Diagnosis not present

## 2013-07-08 DIAGNOSIS — Z9181 History of falling: Secondary | ICD-10-CM | POA: Diagnosis not present

## 2013-07-10 DIAGNOSIS — M25559 Pain in unspecified hip: Secondary | ICD-10-CM | POA: Diagnosis not present

## 2013-07-10 DIAGNOSIS — IMO0001 Reserved for inherently not codable concepts without codable children: Secondary | ICD-10-CM | POA: Diagnosis not present

## 2013-07-10 DIAGNOSIS — S32609A Unspecified fracture of unspecified ischium, initial encounter for closed fracture: Secondary | ICD-10-CM | POA: Diagnosis not present

## 2013-07-10 DIAGNOSIS — Z9181 History of falling: Secondary | ICD-10-CM | POA: Diagnosis not present

## 2013-07-10 DIAGNOSIS — S32509A Unspecified fracture of unspecified pubis, initial encounter for closed fracture: Secondary | ICD-10-CM | POA: Diagnosis not present

## 2013-07-10 DIAGNOSIS — I4891 Unspecified atrial fibrillation: Secondary | ICD-10-CM | POA: Diagnosis not present

## 2013-07-11 DIAGNOSIS — I4891 Unspecified atrial fibrillation: Secondary | ICD-10-CM | POA: Diagnosis not present

## 2013-07-11 DIAGNOSIS — Z9181 History of falling: Secondary | ICD-10-CM | POA: Diagnosis not present

## 2013-07-11 DIAGNOSIS — S32509A Unspecified fracture of unspecified pubis, initial encounter for closed fracture: Secondary | ICD-10-CM | POA: Diagnosis not present

## 2013-07-11 DIAGNOSIS — IMO0001 Reserved for inherently not codable concepts without codable children: Secondary | ICD-10-CM | POA: Diagnosis not present

## 2013-07-11 DIAGNOSIS — S32609A Unspecified fracture of unspecified ischium, initial encounter for closed fracture: Secondary | ICD-10-CM | POA: Diagnosis not present

## 2013-07-11 DIAGNOSIS — M25559 Pain in unspecified hip: Secondary | ICD-10-CM | POA: Diagnosis not present

## 2013-07-16 DIAGNOSIS — S32509A Unspecified fracture of unspecified pubis, initial encounter for closed fracture: Secondary | ICD-10-CM | POA: Diagnosis not present

## 2013-07-16 DIAGNOSIS — I4891 Unspecified atrial fibrillation: Secondary | ICD-10-CM | POA: Diagnosis not present

## 2013-07-16 DIAGNOSIS — M25559 Pain in unspecified hip: Secondary | ICD-10-CM | POA: Diagnosis not present

## 2013-07-16 DIAGNOSIS — Z9181 History of falling: Secondary | ICD-10-CM | POA: Diagnosis not present

## 2013-07-16 DIAGNOSIS — S32609A Unspecified fracture of unspecified ischium, initial encounter for closed fracture: Secondary | ICD-10-CM | POA: Diagnosis not present

## 2013-07-16 DIAGNOSIS — IMO0001 Reserved for inherently not codable concepts without codable children: Secondary | ICD-10-CM | POA: Diagnosis not present

## 2013-07-18 DIAGNOSIS — S32509A Unspecified fracture of unspecified pubis, initial encounter for closed fracture: Secondary | ICD-10-CM | POA: Diagnosis not present

## 2013-07-18 DIAGNOSIS — I4891 Unspecified atrial fibrillation: Secondary | ICD-10-CM | POA: Diagnosis not present

## 2013-07-18 DIAGNOSIS — IMO0001 Reserved for inherently not codable concepts without codable children: Secondary | ICD-10-CM | POA: Diagnosis not present

## 2013-07-18 DIAGNOSIS — M25559 Pain in unspecified hip: Secondary | ICD-10-CM | POA: Diagnosis not present

## 2013-07-18 DIAGNOSIS — S32609A Unspecified fracture of unspecified ischium, initial encounter for closed fracture: Secondary | ICD-10-CM | POA: Diagnosis not present

## 2013-07-18 DIAGNOSIS — Z9181 History of falling: Secondary | ICD-10-CM | POA: Diagnosis not present

## 2013-07-23 DIAGNOSIS — IMO0001 Reserved for inherently not codable concepts without codable children: Secondary | ICD-10-CM | POA: Diagnosis not present

## 2013-07-23 DIAGNOSIS — I4891 Unspecified atrial fibrillation: Secondary | ICD-10-CM | POA: Diagnosis not present

## 2013-07-23 DIAGNOSIS — Z9181 History of falling: Secondary | ICD-10-CM | POA: Diagnosis not present

## 2013-07-23 DIAGNOSIS — S32609A Unspecified fracture of unspecified ischium, initial encounter for closed fracture: Secondary | ICD-10-CM | POA: Diagnosis not present

## 2013-07-23 DIAGNOSIS — M25559 Pain in unspecified hip: Secondary | ICD-10-CM | POA: Diagnosis not present

## 2013-07-23 DIAGNOSIS — S32509A Unspecified fracture of unspecified pubis, initial encounter for closed fracture: Secondary | ICD-10-CM | POA: Diagnosis not present

## 2013-07-25 DIAGNOSIS — IMO0001 Reserved for inherently not codable concepts without codable children: Secondary | ICD-10-CM | POA: Diagnosis not present

## 2013-07-25 DIAGNOSIS — Z9181 History of falling: Secondary | ICD-10-CM | POA: Diagnosis not present

## 2013-07-25 DIAGNOSIS — M25559 Pain in unspecified hip: Secondary | ICD-10-CM | POA: Diagnosis not present

## 2013-07-25 DIAGNOSIS — S32509A Unspecified fracture of unspecified pubis, initial encounter for closed fracture: Secondary | ICD-10-CM | POA: Diagnosis not present

## 2013-07-25 DIAGNOSIS — S32609A Unspecified fracture of unspecified ischium, initial encounter for closed fracture: Secondary | ICD-10-CM | POA: Diagnosis not present

## 2013-07-25 DIAGNOSIS — I4891 Unspecified atrial fibrillation: Secondary | ICD-10-CM | POA: Diagnosis not present

## 2013-07-29 ENCOUNTER — Encounter: Payer: Self-pay | Admitting: Family

## 2013-07-29 ENCOUNTER — Ambulatory Visit (INDEPENDENT_AMBULATORY_CARE_PROVIDER_SITE_OTHER): Payer: Medicare Other | Admitting: Family

## 2013-07-29 VITALS — BP 140/68 | HR 57 | Ht 70.0 in | Wt 150.6 lb

## 2013-07-29 DIAGNOSIS — I10 Essential (primary) hypertension: Secondary | ICD-10-CM

## 2013-07-29 DIAGNOSIS — R350 Frequency of micturition: Secondary | ICD-10-CM | POA: Diagnosis not present

## 2013-07-29 DIAGNOSIS — I499 Cardiac arrhythmia, unspecified: Secondary | ICD-10-CM

## 2013-07-29 DIAGNOSIS — F3289 Other specified depressive episodes: Secondary | ICD-10-CM

## 2013-07-29 DIAGNOSIS — D649 Anemia, unspecified: Secondary | ICD-10-CM | POA: Diagnosis not present

## 2013-07-29 DIAGNOSIS — R9431 Abnormal electrocardiogram [ECG] [EKG]: Secondary | ICD-10-CM

## 2013-07-29 DIAGNOSIS — F329 Major depressive disorder, single episode, unspecified: Secondary | ICD-10-CM

## 2013-07-29 DIAGNOSIS — E785 Hyperlipidemia, unspecified: Secondary | ICD-10-CM | POA: Diagnosis not present

## 2013-07-29 DIAGNOSIS — F32A Depression, unspecified: Secondary | ICD-10-CM | POA: Insufficient documentation

## 2013-07-29 LAB — POCT URINALYSIS DIPSTICK
Glucose, UA: NEGATIVE
Leukocytes, UA: NEGATIVE
Nitrite, UA: NEGATIVE
Protein, UA: NEGATIVE
RBC UA: NEGATIVE
Spec Grav, UA: 1.015
Urobilinogen, UA: 0.2
pH, UA: 5

## 2013-07-29 LAB — CBC WITH DIFFERENTIAL/PLATELET
Basophils Absolute: 0 10*3/uL (ref 0.0–0.1)
Basophils Relative: 0.3 % (ref 0.0–3.0)
EOS ABS: 0.2 10*3/uL (ref 0.0–0.7)
Eosinophils Relative: 3.6 % (ref 0.0–5.0)
HEMATOCRIT: 32 % — AB (ref 36.0–46.0)
HEMOGLOBIN: 10.9 g/dL — AB (ref 12.0–15.0)
LYMPHS ABS: 1.2 10*3/uL (ref 0.7–4.0)
Lymphocytes Relative: 26.5 % (ref 12.0–46.0)
MCHC: 34.1 g/dL (ref 30.0–36.0)
MCV: 87.2 fl (ref 78.0–100.0)
Monocytes Absolute: 0.6 10*3/uL (ref 0.1–1.0)
Monocytes Relative: 13.1 % — ABNORMAL HIGH (ref 3.0–12.0)
NEUTROS ABS: 2.5 10*3/uL (ref 1.4–7.7)
Neutrophils Relative %: 56.5 % (ref 43.0–77.0)
Platelets: 225 10*3/uL (ref 150.0–400.0)
RBC: 3.67 Mil/uL — ABNORMAL LOW (ref 3.87–5.11)
RDW: 14.6 % (ref 11.5–15.5)
WBC: 4.4 10*3/uL (ref 4.0–10.5)

## 2013-07-29 LAB — HEPATIC FUNCTION PANEL
ALT: 11 U/L (ref 0–35)
AST: 19 U/L (ref 0–37)
Albumin: 4.1 g/dL (ref 3.5–5.2)
Alkaline Phosphatase: 92 U/L (ref 39–117)
BILIRUBIN DIRECT: 0 mg/dL (ref 0.0–0.3)
BILIRUBIN TOTAL: 0.5 mg/dL (ref 0.2–1.2)
Total Protein: 7.1 g/dL (ref 6.0–8.3)

## 2013-07-29 LAB — LIPID PANEL
CHOLESTEROL: 222 mg/dL — AB (ref 0–200)
HDL: 57 mg/dL (ref >39.00)
LDL Cholesterol: 137 mg/dL — ABNORMAL HIGH (ref 0–99)
NonHDL: 165
Total CHOL/HDL Ratio: 4
Triglycerides: 139 mg/dL (ref 0.0–149.0)
VLDL: 27.8 mg/dL (ref 0.0–40.0)

## 2013-07-29 LAB — BASIC METABOLIC PANEL
BUN: 24 mg/dL — ABNORMAL HIGH (ref 6–23)
CO2: 27 mEq/L (ref 19–32)
CREATININE: 1 mg/dL (ref 0.4–1.2)
Calcium: 9.7 mg/dL (ref 8.4–10.5)
Chloride: 104 mEq/L (ref 96–112)
GFR: 57.97 mL/min — AB (ref >60.00)
Glucose, Bld: 65 mg/dL — ABNORMAL LOW (ref 70–99)
POTASSIUM: 5.4 meq/L — AB (ref 3.5–5.1)
Sodium: 138 mEq/L (ref 135–145)

## 2013-07-29 LAB — TSH: TSH: 1.75 u[IU]/mL (ref 0.35–4.50)

## 2013-07-29 MED ORDER — NIACIN ER (ANTIHYPERLIPIDEMIC) 500 MG PO TBCR: 500.0000 mg | EXTENDED_RELEASE_TABLET | Freq: Every day | ORAL | Status: DC

## 2013-07-29 MED ORDER — LISINOPRIL-HYDROCHLOROTHIAZIDE 20-12.5 MG PO TABS: 1.0000 | ORAL_TABLET | Freq: Every day | ORAL | Status: DC

## 2013-07-29 MED ORDER — COLESEVELAM HCL 3.75 G PO PACK: 1.0000 | PACK | Freq: Every day | ORAL | Status: DC

## 2013-07-29 MED ORDER — METHOCARBAMOL 500 MG PO TABS: ORAL_TABLET | ORAL | Status: DC

## 2013-07-29 MED ORDER — VITAMIN D (ERGOCALCIFEROL) 1.25 MG (50000 UNIT) PO CAPS: 50000.0000 [IU] | ORAL_CAPSULE | ORAL | Status: DC

## 2013-07-29 MED ORDER — ESOMEPRAZOLE MAGNESIUM 40 MG PO CPDR: 40.0000 mg | DELAYED_RELEASE_CAPSULE | Freq: Every day | ORAL | Status: DC

## 2013-07-29 MED ORDER — CITALOPRAM HYDROBROMIDE 20 MG PO TABS: 20.0000 mg | ORAL_TABLET | Freq: Every day | ORAL | Status: DC

## 2013-07-29 NOTE — Patient Instructions (Signed)
Heartbeats (How the Heart Works) Your heart is a hollow muscular organ that pumps blood around your body. This is necessary for life because the blood carries the oxygen we breathe and the food we eat to all the cells of the body. The blood also carries the waste products away from the cells.  Humans have a heart with four chambers. A heartbeat is a two-part pumping action that takes about a second. Blood collects in the two upper chambers of the heart (the right and left atria). When these chambers are full, a group of specialized cells (the sinoatrial node) sends out an electrical signal that makes the atria squeeze (contract). This contraction completes the filling of the resting lower heart chambers (the right and left ventricles) by pushing a little extra blood through the mitral and tricuspid valves. The ventricles are the muscular chambers of the heart. The right ventricle pushes blood through the lungs. Because the left ventricle pumps blood to the rest of the body, it is more muscular. The period when the ventricles are filling is called diastole. The bottom number in your blood pressure is measured at this time. When the ventricles are contracting, it is called systole. Systole is the top number in your blood pressure.  Once the bottom muscular chambers of the heart are full of blood, slightly delayed electrical signals from the atria travel along a network of cells to the ventricles, causing them to contract. As the tricuspid and mitral valves shut tight to prevent a backflow of blood, the pulmonary (pulmonic) and aortic valves are pushed open. While the right ventricle pushes blood through the lungs to pick up oxygen, oxygen-rich blood flows from the left ventricle to the heart and other parts of the body.  When the ventricles relax, the pulmonary and aortic valves close. The lower pressure in the ventricles causes the tricuspid and mitral valves to open, and blood stored in the atria rushes in and  the cycle begins again.  Your heart beats faster and works harder during times of exertion. It works less hard and beats more slowly when you are resting. Your brain sends signals to the heart to meet the needs of your body. By avoiding smoking and controlling blood pressure, cholesterol, and blood sugar, you may help your heart stay healthy longer.  Document Released: 11/25/2003 Document Revised: 08/22/2012 Document Reviewed: 05/14/2012 [facility] Patient Information 2015 [employer]. This information is not intended to replace advice given to you by your health care provider. Make sure you discuss any questions you have with your health care provider.

## 2013-07-29 NOTE — Progress Notes (Signed)
Subjective:    Patient ID: Tracey Rosario, female    DOB: May 23, 1926, 80 y.o.   MRN: [id number]  HPI  80 year-old nonsmoking Caucasian female presents to establish care [place]. She lives alone and complains of no problems with ADLs. She has been recently released from PT after a pelvic fracture fall in May 2015. Today she complains of no pain or associated problems following her surgery. She is walking without an assistive device and is accompanied by her daughter.   PMH is complicated by Afib (untreated), HTN, hyperlipidemia, and anxiety/insomnia. She is compliant and controlled with all medications. Her records have been requested from her former physician, Dr. Merton BorderSherry Rosario.   She reports increased urinary frequency.   Review of Systems  Constitutional: Negative.   HENT: Negative.   Eyes: Negative.   Respiratory: Negative.   Cardiovascular: Negative.   Gastrointestinal: Negative.   Endocrine: Negative.   Genitourinary: Positive for frequency.  Musculoskeletal: Negative.   Skin: Negative.   Allergic/Immunologic: Negative.   Neurological: Negative.   Hematological: Negative.   Psychiatric/Behavioral: Negative.    Past Medical History  Diagnosis Date  . Hypertension   . A-fib     History   Social History  . Marital Status: Widowed    Spouse Name: N/A    Number of Children: N/A  . Years of Education: N/A   Occupational History  . Not on file.   Social History Main Topics  . Smoking status: Never Smoker   . Smokeless tobacco: Not on file  . Alcohol Use: Not on file  . Drug Use: Not on file  . Sexual Activity: Not on file   Other Topics Concern  . Not on file   Social History Narrative  . No narrative on file    No past surgical history on file.  No family history on file.  Allergies  Allergen Reactions  . Codeine Other (See Comments)    Makes her crazy    Current Outpatient Prescriptions on File  Prior to Visit  Medication Sig Dispense Refill  . aspirin 81 MG tablet Take 81 mg by mouth daily.      Marland Kitchen. atenolol (TENORMIN) 50 MG tablet Take 50 mg by mouth daily.      . citalopram (CELEXA) 20 MG tablet Take 20 mg by mouth daily.      . Colesevelam HCl (WELCHOL) 3.75 G PACK Take 1 packet by mouth daily.      Marland Kitchen. esomeprazole (NEXIUM) 40 MG capsule Take 40 mg by mouth daily at 12 noon.      Marland Kitchen. lisinopril-hydrochlorothiazide (PRINZIDE,ZESTORETIC) 20-12.5 MG per tablet Take 1 tablet by mouth daily.      . Multiple Vitamin (MULTIVITAMIN WITH MINERALS) TABS tablet Take 1 tablet by mouth daily.      . niacin (NIASPAN) 500 MG CR tablet Take 500 mg by mouth at bedtime.      . Vitamin D, Ergocalciferol, (DRISDOL) 50000 UNITS CAPS capsule Take 50,000 Units by mouth every 7 (seven) days.      . methocarbamol (ROBAXIN) 500 MG tablet TAKE 1 TABLET BY MOUTH 3 TIMES A DAY  90 tablet  0   No current facility-administered medications on file prior to visit.    BP 140/68  Pulse 57  Ht 5\' 10"  (1.778 m)  Wt 150 lb 9.6 oz (68.312 kg)  BMI 21.61 kg/m2    Objective:   Physical Exam  Constitutional: She is oriented to person,  place, and time.  Frail, stable.   HENT:  Head: Normocephalic and atraumatic.  Eyes: EOM are normal. Pupils are equal, round, and reactive to light. No scleral icterus.  Neck: Normal range of motion.  Cardiovascular: Normal rate.  A regularly irregular rhythm present. PMI is not displaced.  Exam reveals distant heart sounds.   Pulmonary/Chest: Effort normal and breath sounds normal.  Abdominal: Soft.  Musculoskeletal: Normal range of motion. She exhibits no edema and no tenderness.  Neurological: She is alert and oriented to person, place, and time. She has normal reflexes.  Skin: Skin is warm and dry.  Psychiatric: She has a normal mood and affect. Her behavior is normal. Judgment and thought content normal.       Assessment & Plan:  Tracey Rosario was seen today for establish  care.  Diagnoses and associated orders for this visit:  Unspecified essential hypertension - Basic Metabolic Panel  Other and unspecified hyperlipidemia - Lipid Panel - Hepatic Function Panel  Irregular heartbeat - EKG 12-Lead - TSH - Ambulatory referral to Cardiology - 2D Echocardiogram with contrast; Future  EKG abnormality - 2D Echocardiogram with contrast; Future  Urinary frequency - Cancel: Urinalysis - POCT urinalysis dipstick  Other Orders - Cancel: Vitamin D, Ergocalciferol, (DRISDOL) 50000 UNITS CAPS capsule; Take 1 capsule (50,000 Units total) by mouth every 7 (seven) days. - Cancel: niacin (NIASPAN) 500 MG CR tablet; Take 1 tablet (500 mg total) by mouth at bedtime. - Cancel: lisinopril-hydrochlorothiazide (PRINZIDE,ZESTORETIC) 20-12.5 MG per tablet; Take 1 tablet by mouth daily. - Cancel: esomeprazole (NEXIUM) 40 MG capsule; Take 1 capsule (40 mg total) by mouth daily at 12 noon. - Cancel: Colesevelam HCl (WELCHOL) 3.75 G PACK; Take 1 packet by mouth daily. - Cancel: citalopram (CELEXA) 20 MG tablet; Take 1 tablet (20 mg total) by mouth daily. - Cancel: atenolol (TENORMIN) 50 MG tablet; Take 1 tablet (50 mg total) by mouth daily.

## 2013-07-29 NOTE — Progress Notes (Signed)
Pre visit review using our clinic review tool, if applicable. No additional management support is needed unless otherwise documented below in the visit note. 

## 2013-07-31 DIAGNOSIS — S32509A Unspecified fracture of unspecified pubis, initial encounter for closed fracture: Secondary | ICD-10-CM | POA: Diagnosis not present

## 2013-07-31 DIAGNOSIS — IMO0001 Reserved for inherently not codable concepts without codable children: Secondary | ICD-10-CM | POA: Diagnosis not present

## 2013-07-31 DIAGNOSIS — M25559 Pain in unspecified hip: Secondary | ICD-10-CM | POA: Diagnosis not present

## 2013-07-31 DIAGNOSIS — I4891 Unspecified atrial fibrillation: Secondary | ICD-10-CM | POA: Diagnosis not present

## 2013-07-31 DIAGNOSIS — S32609A Unspecified fracture of unspecified ischium, initial encounter for closed fracture: Secondary | ICD-10-CM | POA: Diagnosis not present

## 2013-07-31 DIAGNOSIS — Z9181 History of falling: Secondary | ICD-10-CM | POA: Diagnosis not present

## 2013-08-01 ENCOUNTER — Encounter: Payer: Self-pay | Admitting: Internal Medicine

## 2013-08-01 ENCOUNTER — Ambulatory Visit (INDEPENDENT_AMBULATORY_CARE_PROVIDER_SITE_OTHER): Payer: Medicare Other | Admitting: Internal Medicine

## 2013-08-01 VITALS — BP 144/62 | HR 53 | Ht 70.0 in | Wt 152.2 lb

## 2013-08-01 DIAGNOSIS — I4949 Other premature depolarization: Secondary | ICD-10-CM

## 2013-08-01 DIAGNOSIS — E785 Hyperlipidemia, unspecified: Secondary | ICD-10-CM

## 2013-08-01 DIAGNOSIS — I10 Essential (primary) hypertension: Secondary | ICD-10-CM

## 2013-08-01 DIAGNOSIS — I493 Ventricular premature depolarization: Secondary | ICD-10-CM

## 2013-08-01 NOTE — Progress Notes (Signed)
OFFICE NOTE  Chief Complaint:  PVC's  Primary Care Physician: Janell QuietAMPBELL, PADONDA BOYD, FNP  HPI:  Tracey Rosario is a pleasant 80 year old female is coming referred to me for evaluation of PVCs. She's had palpitations for a good part of her life and has been on atenolol. In general this is done good job of suppressing these PVCs but she's really been asymptomatic. Recently [place]. An EKG in the office showed PVCs and trigeminal pout present today. She was unaware of these. She denies any shortness of breath or chest pain. She recently had a fall and hip fracture and underwent repair of that without any problems. She is rehabilitated and is currently living alone in her townhouse. She does have support from both of her sisters in the same development. She is ready been scheduled for an echocardiogram.  PMHx:  Past Medical History  Diagnosis Date  . Hypertension   . A-fib     Past Surgical History  Procedure Laterality Date  . Abdominal hysterectomy  1971  . Bladder surgery  2010  . Broken pelvis  2015    FAMHx:  Family History  Problem Relation Age of Onset  . Prostate cancer Father   . Cancer Brother   . Cancer Sister   . Heart attack Sister   . Cancer Sister     SOCHx:   reports that she quit smoking about 43 years ago. She does not have any smokeless tobacco history on file. She reports that she does not drink alcohol or use illicit drugs.  ALLERGIES:  Allergies  Allergen Reactions  . Codeine Other (See Comments)    Makes her crazy    ROS: A comprehensive review of systems was negative except for: Musculoskeletal: positive for gait difficulty  HOME MEDS: Current Outpatient Prescriptions  Medication Sig Dispense Refill  . aspirin 81 MG tablet Take 81 mg by mouth daily.      Marland Kitchen. atenolol (TENORMIN) 50 MG tablet Take 50 mg by mouth daily.      . citalopram (CELEXA) 20 MG tablet Take  1 tablet (20 mg total) by mouth daily.  90 tablet  1  . Colesevelam HCl [facility](WELCHOL) 3.75 G PACK Take 1 packet by mouth daily.  90 packet  1  . esomeprazole (NEXIUM) 40 MG capsule Take 1 capsule (40 mg total) by mouth daily at 12 noon.  90 capsule  1  . lisinopril-hydrochlorothiazide (PRINZIDE,ZESTORETIC) 20-12.5 MG per tablet Take 1 tablet by mouth daily.  90 tablet  1  . Multiple Vitamin (MULTIVITAMIN WITH MINERALS) TABS tablet Take 1 tablet by mouth daily.      . niacin (NIASPAN) 500 MG CR tablet Take 1 tablet (500 mg total) by mouth at bedtime.  90 tablet  1  . Vitamin D, Ergocalciferol, (DRISDOL) 50000 UNITS CAPS capsule Take 1 capsule (50,000 Units total) by mouth every 7 (seven) days.  12 capsule  1   No current facility-administered medications for this visit.    LABS/IMAGING: No results found for this or any previous visit (from the past 48 hour(s)). No results found.  VITALS: BP 144/62  Pulse 53  Ht 5\' 10"  (1.778 m)  Wt 152 lb 3.2 oz (69.037 kg)  BMI 21.84 kg/m2  EXAM: General appearance: alert and no distress Neck: no carotid bruit and no JVD Lungs: clear to auscultation bilaterally Heart: regular rate and rhythm, S1, S2 normal, no murmur, click, rub or gallop  Abdomen: soft, non-tender; bowel sounds normal; no masses,  no organomegaly Extremities: extremities normal, atraumatic, no cyanosis or edema Pulses: 2+ and symmetric Skin: Skin color, texture, turgor normal. No rashes or lesions Neurologic: Grossly normal Psych: Normal  EKG: Sinus bradycardia with first degree AV block at 53  ASSESSMENT: 1. Asymptomatic PVCs 2. Hypertension 3. Dyslipidemia  PLAN: 1.   Mrs. Pareja was noted to have PVCs in a trigeminal pattern which is not present today. She's had palpitations to some extent throughout her life but she is generally unaware of them. She is placed on a beta blocker and has been on that for some time. Heart rate is low and remains in the 50s but she is  asymptomatic. She denies shortness of breath chest pain. She's ready scheduled for an echocardiogram on August 6 and I will look forward to those results. I did not feel that she needs additional ischemia workup at this time. If her EF is normal, these are most likely benign PVCs and I would not recommend any different treatment.  It should be also noted that there was a mention of atrial fibrillation and her chart however she says that she was never told she had atrial fibrillation and there is no documentation to support this. I therefore deleted is from her medical record.  Followup with me can be as needed as long as her EF is normal on echo.  Thanks for the referral.  Chrystie Nose, MD, [facility] Attending Cardiologist [facility]  Ayrianna Mcginniss C 08/01/2013, 10:11 AM

## 2013-08-01 NOTE — Patient Instructions (Signed)
Your physician recommends that you schedule a follow-up appointment as needed  

## 2013-08-08 ENCOUNTER — Other Ambulatory Visit (HOSPITAL_COMMUNITY): Payer: Medicare Other

## 2013-08-09 ENCOUNTER — Encounter (HOSPITAL_COMMUNITY): Payer: Self-pay | Admitting: Family

## 2013-08-16 ENCOUNTER — Ambulatory Visit (HOSPITAL_COMMUNITY): Payer: Medicare Other | Attending: Family | Admitting: Radiology

## 2013-08-16 DIAGNOSIS — I079 Rheumatic tricuspid valve disease, unspecified: Secondary | ICD-10-CM | POA: Insufficient documentation

## 2013-08-16 DIAGNOSIS — R9431 Abnormal electrocardiogram [ECG] [EKG]: Secondary | ICD-10-CM | POA: Diagnosis not present

## 2013-08-16 DIAGNOSIS — I359 Nonrheumatic aortic valve disorder, unspecified: Secondary | ICD-10-CM | POA: Insufficient documentation

## 2013-08-16 DIAGNOSIS — I499 Cardiac arrhythmia, unspecified: Secondary | ICD-10-CM

## 2013-08-16 NOTE — Progress Notes (Signed)
Echocardiogram performed.  

## 2013-08-21 ENCOUNTER — Telehealth: Payer: Self-pay | Admitting: Internal Medicine

## 2013-08-21 NOTE — Telephone Encounter (Signed)
New Message  Pt called for ECHO stress results. Please call back to discuss

## 2013-08-21 NOTE — Telephone Encounter (Signed)
Spoke with Harriett SineNancy, patient's daughter - provided echo results. She would like her mom to see Dr. Rennis GoldenHilty on annual basis. Message sent to scheduler to put in yearly recall.

## 2013-08-21 NOTE — Telephone Encounter (Signed)
Tracey SayresP. Campbell, FNP ordered echo.   Review and I'll give report or should she call ordering provider's office?

## 2013-10-03 DIAGNOSIS — Z803 Family history of malignant neoplasm of breast: Secondary | ICD-10-CM | POA: Diagnosis not present

## 2013-10-03 DIAGNOSIS — Z1231 Encounter for screening mammogram for malignant neoplasm of breast: Secondary | ICD-10-CM | POA: Diagnosis not present

## 2013-10-06 ENCOUNTER — Encounter (HOSPITAL_COMMUNITY)
Admission: AD | Disposition: A | Discharge status: Skilled Nursing Facility | Payer: Self-pay | Source: Ambulatory Visit | Attending: Orthopaedic Surgery

## 2013-10-06 SURGERY — FIXATION, FEMUR, NECK, PERCUTANEOUS, USING SCREW
Anesthesia: General | Laterality: Right

## 2013-10-06 SURGICAL SUPPLY — 29 items
COVER PERINEAL POST (MISCELLANEOUS) ×3 IMPLANT
COVER SURGICAL LIGHT HANDLE (MISCELLANEOUS) ×3 IMPLANT
DRAPE STERI IOBAN 125X83 (DRAPES) ×3 IMPLANT
DRSG MEPILEX BORDER 4X4 (GAUZE/BANDAGES/DRESSINGS) IMPLANT
DURAPREP 26ML APPLICATOR (WOUND CARE) ×3 IMPLANT
ELECT REM PT RETURN 9FT ADLT (ELECTROSURGICAL) ×3
ELECTRODE REM PT RTRN 9FT ADLT (ELECTROSURGICAL) ×1 IMPLANT
GLOVE BIO SURGEON STRL SZ8 (GLOVE) ×3 IMPLANT
GLOVE BIOGEL PI IND STRL 8 (GLOVE) ×1 IMPLANT
GLOVE BIOGEL PI INDICATOR 8 (GLOVE) ×2
GLOVE ORTHO TXT STRL SZ7.5 (GLOVE) ×3 IMPLANT
GOWN STRL REUS W/ TWL LRG LVL3 (GOWN DISPOSABLE) ×1 IMPLANT
GOWN STRL REUS W/ TWL XL LVL3 (GOWN DISPOSABLE) ×2 IMPLANT
GOWN STRL REUS W/TWL LRG LVL3 (GOWN DISPOSABLE) ×2
GOWN STRL REUS W/TWL XL LVL3 (GOWN DISPOSABLE) ×4
KIT ROOM TURNOVER OR (KITS) ×3 IMPLANT
LINER BOOT UNIVERSAL DISP (MISCELLANEOUS) ×3 IMPLANT
MANIFOLD NEPTUNE II (INSTRUMENTS) ×3 IMPLANT
NS IRRIG 1000ML POUR BTL (IV SOLUTION) ×3 IMPLANT
PACK GENERAL/GYN (CUSTOM PROCEDURE TRAY) ×3 IMPLANT
PAD ARMBOARD 7.5X6 YLW CONV (MISCELLANEOUS) ×6 IMPLANT
STAPLER VISISTAT 35W (STAPLE) ×3 IMPLANT
SUT VIC AB 0 CT1 27 (SUTURE) ×2
SUT VIC AB 0 CT1 27XBRD ANBCTR (SUTURE) ×1 IMPLANT
SUT VIC AB 2-0 CT1 27 (SUTURE) ×4
SUT VIC AB 2-0 CT1 TAPERPNT 27 (SUTURE) ×2 IMPLANT
TOWEL OR 17X24 6PK STRL BLUE (TOWEL DISPOSABLE) ×3 IMPLANT
TOWEL OR 17X26 10 PK STRL BLUE (TOWEL DISPOSABLE) ×3 IMPLANT
WATER STERILE IRR 1000ML POUR (IV SOLUTION) ×3 IMPLANT

## 2013-10-07 ENCOUNTER — Encounter (HOSPITAL_COMMUNITY): Payer: Self-pay | Admitting: Anesthesiology

## 2013-10-07 ENCOUNTER — Encounter (HOSPITAL_COMMUNITY): Payer: Medicare Other | Admitting: Anesthesiology

## 2013-10-07 ENCOUNTER — Encounter (HOSPITAL_COMMUNITY): Payer: Self-pay | Admitting: *Deleted

## 2013-10-07 ENCOUNTER — Inpatient Hospital Stay (HOSPITAL_COMMUNITY)
Admission: AD | Admit: 2013-10-07 | Discharge: 2013-10-10 | DRG: 482 | Disposition: A | Discharge status: Skilled Nursing Facility | Payer: Medicare Other | Source: Ambulatory Visit | Attending: Orthopaedic Surgery | Admitting: Orthopaedic Surgery

## 2013-10-07 ENCOUNTER — Inpatient Hospital Stay (HOSPITAL_COMMUNITY): Payer: Medicare Other | Admitting: Anesthesiology

## 2013-10-07 ENCOUNTER — Encounter (HOSPITAL_COMMUNITY)
Admission: AD | Disposition: A | Discharge status: Skilled Nursing Facility | Payer: Self-pay | Source: Ambulatory Visit | Attending: Orthopaedic Surgery

## 2013-10-07 ENCOUNTER — Inpatient Hospital Stay (HOSPITAL_COMMUNITY): Payer: Medicare Other

## 2013-10-07 DIAGNOSIS — Z79899 Other long term (current) drug therapy: Secondary | ICD-10-CM

## 2013-10-07 DIAGNOSIS — Y92019 Unspecified place in single-family (private) house as the place of occurrence of the external cause: Secondary | ICD-10-CM

## 2013-10-07 DIAGNOSIS — R278 Other lack of coordination: Secondary | ICD-10-CM | POA: Diagnosis not present

## 2013-10-07 DIAGNOSIS — Z7982 Long term (current) use of aspirin: Secondary | ICD-10-CM | POA: Diagnosis not present

## 2013-10-07 DIAGNOSIS — K219 Gastro-esophageal reflux disease without esophagitis: Secondary | ICD-10-CM | POA: Diagnosis not present

## 2013-10-07 DIAGNOSIS — Z01810 Encounter for preprocedural cardiovascular examination: Secondary | ICD-10-CM | POA: Diagnosis not present

## 2013-10-07 DIAGNOSIS — I10 Essential (primary) hypertension: Secondary | ICD-10-CM | POA: Diagnosis present

## 2013-10-07 DIAGNOSIS — S72011A Unspecified intracapsular fracture of right femur, initial encounter for closed fracture: Principal | ICD-10-CM | POA: Diagnosis present

## 2013-10-07 DIAGNOSIS — E785 Hyperlipidemia, unspecified: Secondary | ICD-10-CM | POA: Diagnosis not present

## 2013-10-07 DIAGNOSIS — S72001A Fracture of unspecified part of neck of right femur, initial encounter for closed fracture: Secondary | ICD-10-CM | POA: Diagnosis not present

## 2013-10-07 DIAGNOSIS — F32A Depression, unspecified: Secondary | ICD-10-CM

## 2013-10-07 DIAGNOSIS — R531 Weakness: Secondary | ICD-10-CM | POA: Diagnosis not present

## 2013-10-07 DIAGNOSIS — S72001D Fracture of unspecified part of neck of right femur, subsequent encounter for closed fracture with routine healing: Secondary | ICD-10-CM | POA: Diagnosis not present

## 2013-10-07 DIAGNOSIS — I4891 Unspecified atrial fibrillation: Secondary | ICD-10-CM | POA: Diagnosis not present

## 2013-10-07 DIAGNOSIS — W19XXXA Unspecified fall, initial encounter: Secondary | ICD-10-CM | POA: Diagnosis present

## 2013-10-07 DIAGNOSIS — Z8249 Family history of ischemic heart disease and other diseases of the circulatory system: Secondary | ICD-10-CM

## 2013-10-07 DIAGNOSIS — M25551 Pain in right hip: Secondary | ICD-10-CM | POA: Diagnosis not present

## 2013-10-07 DIAGNOSIS — D62 Acute posthemorrhagic anemia: Secondary | ICD-10-CM | POA: Diagnosis not present

## 2013-10-07 DIAGNOSIS — S72011D Unspecified intracapsular fracture of right femur, subsequent encounter for closed fracture with routine healing: Secondary | ICD-10-CM | POA: Diagnosis not present

## 2013-10-07 DIAGNOSIS — Z472 Encounter for removal of internal fixation device: Secondary | ICD-10-CM | POA: Diagnosis not present

## 2013-10-07 DIAGNOSIS — Z87891 Personal history of nicotine dependence: Secondary | ICD-10-CM | POA: Diagnosis not present

## 2013-10-07 DIAGNOSIS — Z9181 History of falling: Secondary | ICD-10-CM | POA: Diagnosis not present

## 2013-10-07 DIAGNOSIS — S72041A Displaced fracture of base of neck of right femur, initial encounter for closed fracture: Secondary | ICD-10-CM | POA: Diagnosis not present

## 2013-10-07 HISTORY — PX: HIP PINNING,CANNULATED: SHX1758

## 2013-10-07 HISTORY — DX: Depression, unspecified: F32.A

## 2013-10-07 LAB — CBC WITH DIFFERENTIAL/PLATELET
BASOS ABS: 0 10*3/uL (ref 0.0–0.1)
BASOS PCT: 0 % (ref 0–1)
EOS ABS: 0.4 10*3/uL (ref 0.0–0.7)
EOS PCT: 6 % — AB (ref 0–5)
HEMATOCRIT: 32.3 % — AB (ref 36.0–46.0)
Hemoglobin: 10.9 g/dL — ABNORMAL LOW (ref 12.0–15.0)
Lymphocytes Relative: 13 % (ref 12–46)
Lymphs Abs: 1 10*3/uL (ref 0.7–4.0)
MCH: 28.8 pg (ref 26.0–34.0)
MCHC: 33.7 g/dL (ref 30.0–36.0)
MCV: 85.4 fL (ref 78.0–100.0)
MONO ABS: 0.7 10*3/uL (ref 0.1–1.0)
Monocytes Relative: 9 % (ref 3–12)
Neutro Abs: 5.4 10*3/uL (ref 1.7–7.7)
Neutrophils Relative %: 72 % (ref 43–77)
Platelets: 143 10*3/uL — ABNORMAL LOW (ref 150–400)
RBC: 3.78 MIL/uL — ABNORMAL LOW (ref 3.87–5.11)
RDW: 13.4 % (ref 11.5–15.5)
WBC: 7.5 10*3/uL (ref 4.0–10.5)

## 2013-10-07 LAB — URINALYSIS, ROUTINE W REFLEX MICROSCOPIC
BILIRUBIN URINE: NEGATIVE
Glucose, UA: NEGATIVE mg/dL
Hgb urine dipstick: NEGATIVE
KETONES UR: NEGATIVE mg/dL
Leukocytes, UA: NEGATIVE
Nitrite: NEGATIVE
Protein, ur: NEGATIVE mg/dL
Specific Gravity, Urine: 1.017 (ref 1.005–1.030)
Urobilinogen, UA: 0.2 mg/dL (ref 0.0–1.0)
pH: 5 (ref 5.0–8.0)

## 2013-10-07 LAB — BASIC METABOLIC PANEL
Anion gap: 13 (ref 5–15)
BUN: 19 mg/dL (ref 6–23)
CALCIUM: 9.2 mg/dL (ref 8.4–10.5)
CO2: 25 meq/L (ref 19–32)
CREATININE: 0.99 mg/dL (ref 0.50–1.10)
Chloride: 100 mEq/L (ref 96–112)
GFR calc Af Amer: 59 mL/min — ABNORMAL LOW (ref >90)
GFR calc non Af Amer: 51 mL/min — ABNORMAL LOW (ref >90)
Glucose, Bld: 86 mg/dL (ref 70–99)
Potassium: 4.7 mEq/L (ref 3.7–5.3)
Sodium: 138 mEq/L (ref 137–147)

## 2013-10-07 LAB — PROTIME-INR
INR: 1.11 (ref 0.00–1.49)
Prothrombin Time: 14.3 seconds (ref 11.6–15.2)

## 2013-10-07 LAB — SURGICAL PCR SCREEN
MRSA, PCR: NEGATIVE
STAPHYLOCOCCUS AUREUS: NEGATIVE

## 2013-10-07 LAB — APTT: aPTT: 34 seconds (ref 24–37)

## 2013-10-07 SURGERY — FIXATION, FEMUR, NECK, PERCUTANEOUS, USING SCREW
Anesthesia: General | Site: Hip | Laterality: Right

## 2013-10-07 MED ORDER — ALUM & MAG HYDROXIDE-SIMETH 200-200-20 MG/5ML PO SUSP: 30.0000 mL | ORAL | Status: DC | PRN

## 2013-10-07 MED ORDER — OXYCODONE HCL 5 MG PO TABS
5.0000 mg | ORAL_TABLET | Freq: Once | ORAL | Status: AC | PRN
  Administered 2013-10-07: 5 mg via ORAL

## 2013-10-07 MED ORDER — ACETAMINOPHEN 325 MG PO TABS: 650.0000 mg | ORAL_TABLET | Freq: Four times a day (QID) | ORAL | Status: DC | PRN

## 2013-10-07 MED ORDER — TRAMADOL HCL 50 MG PO TABS
100.0000 mg | ORAL_TABLET | Freq: Four times a day (QID) | ORAL | Status: DC | PRN
  Administered 2013-10-08 – 2013-10-09 (×3): 100 mg via ORAL
  Filled 2013-10-07 (×3): qty 2

## 2013-10-07 MED ORDER — MENTHOL 3 MG MT LOZG: 1.0000 | LOZENGE | OROMUCOSAL | Status: DC | PRN

## 2013-10-07 MED ORDER — PANTOPRAZOLE SODIUM 40 MG PO TBEC
80.0000 mg | DELAYED_RELEASE_TABLET | Freq: Every day | ORAL | Status: DC
  Administered 2013-10-08 – 2013-10-10 (×3): 80 mg via ORAL
  Filled 2013-10-07 (×3): qty 2

## 2013-10-07 MED ORDER — COLESEVELAM HCL 3.75 G PO PACK
1.0000 | PACK | Freq: Every day | ORAL | Status: DC
  Administered 2013-10-07 – 2013-10-09 (×3): 1 via ORAL
  Filled 2013-10-07 (×4): qty 1

## 2013-10-07 MED ORDER — PROPOFOL 10 MG/ML IV BOLUS
INTRAVENOUS | Status: AC
Start: 2013-10-07 — End: 2013-10-07
  Filled 2013-10-07: qty 20

## 2013-10-07 MED ORDER — ZOLPIDEM TARTRATE 5 MG PO TABS: 5.0000 mg | ORAL_TABLET | Freq: Every evening | ORAL | Status: DC | PRN

## 2013-10-07 MED ORDER — CEFAZOLIN SODIUM-DEXTROSE 2-3 GM-% IV SOLR
2.0000 g | Freq: Four times a day (QID) | INTRAVENOUS | Status: AC
  Administered 2013-10-07 – 2013-10-08 (×2): 2 g via INTRAVENOUS
  Filled 2013-10-07 (×2): qty 50

## 2013-10-07 MED ORDER — MEPERIDINE HCL 25 MG/ML IJ SOLN: 6.2500 mg | INTRAMUSCULAR | Status: DC | PRN

## 2013-10-07 MED ORDER — FENTANYL CITRATE 0.05 MG/ML IJ SOLN
INTRAMUSCULAR | Status: AC
  Filled 2013-10-07: qty 5

## 2013-10-07 MED ORDER — ATENOLOL 50 MG PO TABS
50.0000 mg | ORAL_TABLET | Freq: Every day | ORAL | Status: DC
  Administered 2013-10-08 – 2013-10-10 (×3): 50 mg via ORAL
  Filled 2013-10-07 (×3): qty 1

## 2013-10-07 MED ORDER — HYDROCHLOROTHIAZIDE 12.5 MG PO CAPS
12.5000 mg | ORAL_CAPSULE | Freq: Every day | ORAL | Status: DC
  Administered 2013-10-08 – 2013-10-10 (×3): 12.5 mg via ORAL
  Filled 2013-10-07 (×3): qty 1

## 2013-10-07 MED ORDER — ONDANSETRON HCL 4 MG/2ML IJ SOLN
INTRAMUSCULAR | Status: AC
  Filled 2013-10-07: qty 2

## 2013-10-07 MED ORDER — METOCLOPRAMIDE HCL 10 MG PO TABS: 5.0000 mg | ORAL_TABLET | Freq: Three times a day (TID) | ORAL | Status: DC | PRN

## 2013-10-07 MED ORDER — NIACIN ER (ANTIHYPERLIPIDEMIC) 500 MG PO TBCR
500.0000 mg | EXTENDED_RELEASE_TABLET | Freq: Every day | ORAL | Status: DC
  Administered 2013-10-07 – 2013-10-09 (×3): 500 mg via ORAL
  Filled 2013-10-07 (×4): qty 1

## 2013-10-07 MED ORDER — FENTANYL CITRATE 0.05 MG/ML IJ SOLN
INTRAMUSCULAR | Status: DC | PRN
  Administered 2013-10-07: 50 ug via INTRAVENOUS

## 2013-10-07 MED ORDER — ONDANSETRON HCL 4 MG/2ML IJ SOLN: 4.0000 mg | Freq: Once | INTRAMUSCULAR | Status: DC | PRN

## 2013-10-07 MED ORDER — METHOCARBAMOL 1000 MG/10ML IJ SOLN
500.0000 mg | Freq: Four times a day (QID) | INTRAVENOUS | Status: DC | PRN
  Filled 2013-10-07: qty 5

## 2013-10-07 MED ORDER — ACETAMINOPHEN 650 MG RE SUPP: 650.0000 mg | Freq: Four times a day (QID) | RECTAL | Status: DC | PRN

## 2013-10-07 MED ORDER — OXYCODONE HCL 5 MG/5ML PO SOLN: 5.0000 mg | Freq: Once | ORAL | Status: AC | PRN

## 2013-10-07 MED ORDER — METOCLOPRAMIDE HCL 5 MG/ML IJ SOLN: 5.0000 mg | Freq: Three times a day (TID) | INTRAMUSCULAR | Status: DC | PRN

## 2013-10-07 MED ORDER — GLYCOPYRROLATE 0.2 MG/ML IJ SOLN
INTRAMUSCULAR | Status: AC
  Filled 2013-10-07: qty 2

## 2013-10-07 MED ORDER — 0.9 % SODIUM CHLORIDE (POUR BTL) OPTIME
TOPICAL | Status: DC | PRN
  Administered 2013-10-07: 1000 mL

## 2013-10-07 MED ORDER — ROCURONIUM BROMIDE 100 MG/10ML IV SOLN
INTRAVENOUS | Status: DC | PRN
Start: 2013-10-07 — End: 2013-10-07
  Administered 2013-10-07: 30 mg via INTRAVENOUS

## 2013-10-07 MED ORDER — HYDROMORPHONE HCL 1 MG/ML IJ SOLN
INTRAMUSCULAR | Status: AC
  Filled 2013-10-07: qty 1

## 2013-10-07 MED ORDER — LIDOCAINE HCL (CARDIAC) 20 MG/ML IV SOLN
INTRAVENOUS | Status: AC
  Filled 2013-10-07: qty 5

## 2013-10-07 MED ORDER — ONDANSETRON HCL 4 MG/2ML IJ SOLN
INTRAMUSCULAR | Status: DC | PRN
  Administered 2013-10-07: 4 mg via INTRAVENOUS

## 2013-10-07 MED ORDER — LISINOPRIL 20 MG PO TABS
20.0000 mg | ORAL_TABLET | Freq: Every day | ORAL | Status: DC
  Administered 2013-10-08 – 2013-10-10 (×3): 20 mg via ORAL
  Filled 2013-10-07 (×3): qty 1

## 2013-10-07 MED ORDER — HYDROMORPHONE HCL 1 MG/ML IJ SOLN
0.2500 mg | INTRAMUSCULAR | Status: DC | PRN
  Administered 2013-10-07: 0.5 mg via INTRAVENOUS

## 2013-10-07 MED ORDER — ROCURONIUM BROMIDE 50 MG/5ML IV SOLN
INTRAVENOUS | Status: AC
  Filled 2013-10-07: qty 1

## 2013-10-07 MED ORDER — CEFAZOLIN SODIUM-DEXTROSE 2-3 GM-% IV SOLR
2.0000 g | Freq: Once | INTRAVENOUS | Status: AC
  Administered 2013-10-07: 2 g via INTRAVENOUS
  Filled 2013-10-07: qty 50

## 2013-10-07 MED ORDER — LISINOPRIL-HYDROCHLOROTHIAZIDE 20-12.5 MG PO TABS: 1.0000 | ORAL_TABLET | Freq: Every day | ORAL | Status: DC

## 2013-10-07 MED ORDER — LIDOCAINE HCL (CARDIAC) 20 MG/ML IV SOLN
INTRAVENOUS | Status: DC | PRN
  Administered 2013-10-07: 40 mg via INTRAVENOUS

## 2013-10-07 MED ORDER — METHOCARBAMOL 500 MG PO TABS
500.0000 mg | ORAL_TABLET | Freq: Four times a day (QID) | ORAL | Status: DC | PRN
  Administered 2013-10-07 – 2013-10-08 (×2): 500 mg via ORAL
  Filled 2013-10-07: qty 1

## 2013-10-07 MED ORDER — PROPOFOL 10 MG/ML IV BOLUS
INTRAVENOUS | Status: DC | PRN
  Administered 2013-10-07: 70 mg via INTRAVENOUS
  Administered 2013-10-07: 40 mg via INTRAVENOUS

## 2013-10-07 MED ORDER — ONDANSETRON HCL 4 MG/2ML IJ SOLN: 4.0000 mg | Freq: Four times a day (QID) | INTRAMUSCULAR | Status: DC | PRN

## 2013-10-07 MED ORDER — OXYCODONE HCL 5 MG PO TABS
ORAL_TABLET | ORAL | Status: AC
  Filled 2013-10-07: qty 1

## 2013-10-07 MED ORDER — PHENOL 1.4 % MT LIQD: 1.0000 | OROMUCOSAL | Status: DC | PRN

## 2013-10-07 MED ORDER — NEOSTIGMINE METHYLSULFATE 10 MG/10ML IV SOLN
INTRAVENOUS | Status: DC | PRN
  Administered 2013-10-07: 3 mg via INTRAVENOUS

## 2013-10-07 MED ORDER — MORPHINE SULFATE 2 MG/ML IJ SOLN: 0.5000 mg | INTRAMUSCULAR | Status: DC | PRN

## 2013-10-07 MED ORDER — CITALOPRAM HYDROBROMIDE 20 MG PO TABS
20.0000 mg | ORAL_TABLET | Freq: Every day | ORAL | Status: DC
  Administered 2013-10-08 – 2013-10-09 (×2): 20 mg via ORAL
  Filled 2013-10-07 (×3): qty 1

## 2013-10-07 MED ORDER — SODIUM CHLORIDE 0.9 % IV SOLN
INTRAVENOUS | Status: DC
  Administered 2013-10-07: 13:00:00 via INTRAVENOUS

## 2013-10-07 MED ORDER — SODIUM CHLORIDE 0.9 % IV SOLN: INTRAVENOUS | Status: DC

## 2013-10-07 MED ORDER — GLYCOPYRROLATE 0.2 MG/ML IJ SOLN
INTRAMUSCULAR | Status: DC | PRN
Start: 2013-10-07 — End: 2013-10-07
  Administered 2013-10-07: 0.4 mg via INTRAVENOUS

## 2013-10-07 MED ORDER — LACTATED RINGERS IV SOLN
INTRAVENOUS | Status: DC
  Administered 2013-10-07: 16:00:00 via INTRAVENOUS

## 2013-10-07 MED ORDER — LIDOCAINE HCL 4 % MT SOLN
OROMUCOSAL | Status: DC | PRN
  Administered 2013-10-07: 4 mL via TOPICAL

## 2013-10-07 MED ORDER — ONDANSETRON HCL 4 MG PO TABS
4.0000 mg | ORAL_TABLET | Freq: Four times a day (QID) | ORAL | Status: DC | PRN
  Administered 2013-10-10: 4 mg via ORAL
  Filled 2013-10-07: qty 1

## 2013-10-07 MED ORDER — METHOCARBAMOL 500 MG PO TABS
ORAL_TABLET | ORAL | Status: AC
  Filled 2013-10-07: qty 1

## 2013-10-07 MED ORDER — HYDROCODONE-ACETAMINOPHEN 5-325 MG PO TABS
1.0000 | ORAL_TABLET | Freq: Four times a day (QID) | ORAL | Status: DC | PRN
  Administered 2013-10-08 (×3): 1 via ORAL
  Administered 2013-10-10: 2 via ORAL
  Filled 2013-10-07: qty 2
  Filled 2013-10-07 (×3): qty 1

## 2013-10-07 MED ORDER — DOCUSATE SODIUM 100 MG PO CAPS
100.0000 mg | ORAL_CAPSULE | Freq: Two times a day (BID) | ORAL | Status: DC
  Administered 2013-10-07 – 2013-10-10 (×6): 100 mg via ORAL
  Filled 2013-10-07 (×7): qty 1

## 2013-10-07 MED ORDER — ASPIRIN EC 325 MG PO TBEC
325.0000 mg | DELAYED_RELEASE_TABLET | Freq: Every day | ORAL | Status: DC
  Administered 2013-10-08 – 2013-10-10 (×3): 325 mg via ORAL
  Filled 2013-10-07 (×5): qty 1

## 2013-10-07 MED ORDER — ADULT MULTIVITAMIN W/MINERALS CH
1.0000 | ORAL_TABLET | Freq: Every day | ORAL | Status: DC
  Administered 2013-10-08 – 2013-10-10 (×3): 1 via ORAL
  Filled 2013-10-07 (×3): qty 1

## 2013-10-07 MED ORDER — SUCCINYLCHOLINE CHLORIDE 20 MG/ML IJ SOLN
INTRAMUSCULAR | Status: AC
  Filled 2013-10-07: qty 1

## 2013-10-07 MED ORDER — POLYETHYLENE GLYCOL 3350 17 G PO PACK
17.0000 g | PACK | Freq: Every day | ORAL | Status: DC | PRN
  Administered 2013-10-08: 17 g via ORAL
  Filled 2013-10-07: qty 1

## 2013-10-07 SURGICAL SUPPLY — 37 items
BIT DRILL 4.8X300 (BIT) ×2 IMPLANT
BIT DRILL 4.8X300MM (BIT) ×1
COVER PERINEAL POST (MISCELLANEOUS) ×3 IMPLANT
COVER SURGICAL LIGHT HANDLE (MISCELLANEOUS) ×3 IMPLANT
DRAPE STERI IOBAN 125X83 (DRAPES) ×3 IMPLANT
DRSG MEPILEX BORDER 4X4 (GAUZE/BANDAGES/DRESSINGS) ×3 IMPLANT
DURAPREP 26ML APPLICATOR (WOUND CARE) ×3 IMPLANT
ELECT REM PT RETURN 9FT ADLT (ELECTROSURGICAL) ×3
ELECTRODE REM PT RTRN 9FT ADLT (ELECTROSURGICAL) ×1 IMPLANT
GAUZE XEROFORM 1X8 LF (GAUZE/BANDAGES/DRESSINGS) ×3 IMPLANT
GLOVE BIO SURGEON STRL SZ8 (GLOVE) ×3 IMPLANT
GLOVE BIOGEL PI IND STRL 8 (GLOVE) ×1 IMPLANT
GLOVE BIOGEL PI INDICATOR 8 (GLOVE) ×2
GLOVE ORTHO TXT STRL SZ7.5 (GLOVE) ×3 IMPLANT
GOWN STRL REUS W/ TWL LRG LVL3 (GOWN DISPOSABLE) ×1 IMPLANT
GOWN STRL REUS W/ TWL XL LVL3 (GOWN DISPOSABLE) ×2 IMPLANT
GOWN STRL REUS W/TWL LRG LVL3 (GOWN DISPOSABLE) ×2
GOWN STRL REUS W/TWL XL LVL3 (GOWN DISPOSABLE) ×4
KIT ROOM TURNOVER OR (KITS) ×3 IMPLANT
LINER BOOT UNIVERSAL DISP (MISCELLANEOUS) ×3 IMPLANT
MANIFOLD NEPTUNE II (INSTRUMENTS) ×3 IMPLANT
NS IRRIG 1000ML POUR BTL (IV SOLUTION) ×3 IMPLANT
PACK GENERAL/GYN (CUSTOM PROCEDURE TRAY) ×3 IMPLANT
PAD ARMBOARD 7.5X6 YLW CONV (MISCELLANEOUS) ×6 IMPLANT
PIN GUIDE DRILL TIP 2.8X300 (DRILL) ×9 IMPLANT
SCREW CANN 6.5X90 (Screw) ×2 IMPLANT
SCREW CANN 6.5X90 16MM THD (Screw) ×2 IMPLANT
SCREW CANNULATED 6.5X90MM (Screw) ×4 IMPLANT
SCREW CANNULATED 6.5X95 HIP (Screw) ×3 IMPLANT
STAPLER VISISTAT 35W (STAPLE) ×3 IMPLANT
SUT VIC AB 0 CT1 27 (SUTURE) ×2
SUT VIC AB 0 CT1 27XBRD ANBCTR (SUTURE) ×1 IMPLANT
SUT VIC AB 2-0 CT1 27 (SUTURE) ×4
SUT VIC AB 2-0 CT1 TAPERPNT 27 (SUTURE) ×2 IMPLANT
TOWEL OR 17X24 6PK STRL BLUE (TOWEL DISPOSABLE) ×3 IMPLANT
TOWEL OR 17X26 10 PK STRL BLUE (TOWEL DISPOSABLE) ×3 IMPLANT
WATER STERILE IRR 1000ML POUR (IV SOLUTION) ×3 IMPLANT

## 2013-10-07 NOTE — Brief Op Note (Signed)
10/07/2013  6:12 PM  PATIENT:  Ardeen FillersNancy Latka  80 y.o. female  PRE-OPERATIVE DIAGNOSIS:  right hip fracture  POST-OPERATIVE DIAGNOSIS:  right hip fracture  PROCEDURE:  Procedure(s): CANNULATED screws right hip (Right)  SURGEON:  Surgeon(s) and Role:    * Kathryne Hitchhristopher Y Alanie Syler, MD - Primary  PHYSICIAN ASSISTANT:   ASSISTANTS: none   ANESTHESIA:   general  EBL:  Total I/O In: 1000 [I.V.:1000] Out: 100 [Urine:100]  BLOOD ADMINISTERED:none  DRAINS: none   LOCAL MEDICATIONS USED:  NONE  SPECIMEN:  No Specimen  DISPOSITION OF SPECIMEN:  N/A  COUNTS:  YES  TOURNIQUET:  * No tourniquets in log *  DICTATION: .Other Dictation: Dictation Number [id number]  PLAN OF CARE: Admit to inpatient   PATIENT DISPOSITION:  PACU - hemodynamically stable.   Delay start of Pharmacological VTE agent (>24hrs) due to surgical blood loss or risk of bleeding: no

## 2013-10-07 NOTE — Anesthesia Preprocedure Evaluation (Deleted)
Anesthesia Evaluation  Patient identified by MRN, date of birth, ID band Patient awake    Reviewed: Allergy & Precautions, H&P , NPO status , Patient's Chart, lab work & pertinent test results  Airway Mallampati: I TM Distance: >3 FB Neck ROM: Full    Dental   Pulmonary former smoker,          Cardiovascular hypertension, Pt. on medications     Neuro/Psych    GI/Hepatic   Endo/Other    Renal/GU      Musculoskeletal   Abdominal   Peds  Hematology   Anesthesia Other Findings   Reproductive/Obstetrics                           Anesthesia Physical Anesthesia Plan  ASA: II  Anesthesia Plan: General   Post-op Pain Management:    Induction: Intravenous  Airway Management Planned: Oral ETT  Additional Equipment:   Intra-op Plan:   Post-operative Plan: Extubation in OR  Informed Consent: I have reviewed the patients History and Physical, chart, labs and discussed the procedure including the risks, benefits and alternatives for the proposed anesthesia with the patient or authorized representative who has indicated his/her understanding and acceptance.     Plan Discussed with: CRNA and Surgeon  Anesthesia Plan Comments:         Anesthesia Quick Evaluation  

## 2013-10-07 NOTE — Anesthesia Postprocedure Evaluation (Deleted)
  Anesthesia Post-op Note  Patient: Tracey Rosario  Procedure(s) Performed: Procedure(s): CANNULATED SCREWS RIGHT HIP (Right)  Patient Location: PACU  Anesthesia Type: No value filed.   Level of Consciousness: awake, alert  and oriented  Airway and Oxygen Therapy: Patient Spontanous Breathing  Post-op Pain: mild  Post-op Assessment: Post-op Vital signs reviewed  Post-op Vital Signs: Reviewed  Last Vitals:  Filed Vitals:   10/07/13 1954  BP: 119/47  Pulse: 56  Temp: 37 C  Resp: 15    Complications: No apparent anesthesia complications

## 2013-10-07 NOTE — Progress Notes (Signed)
Orthopedic Tech Progress Note Patient Details:  Tracey Rosario 05-11-1926 [id number]  Patient ID: Tracey Rosario, female   DOB: 05-11-1926, 80 y.o.   MRN: [id number] Patient unable to use trapeze bar patient helper; rn notified   Nikki DomCrawford, Velton Roselle 10/07/2013, 10:38 PM

## 2013-10-07 NOTE — Anesthesia Preprocedure Evaluation (Signed)
Anesthesia Evaluation  Patient identified by MRN, date of birth, ID band Patient awake    Reviewed: Allergy & Precautions, H&P , NPO status , Patient's Chart, lab work & pertinent test results  History of Anesthesia Complications Negative for: history of anesthetic complications  Airway Mallampati: II      Dental  (+) Teeth Intact   Pulmonary neg pulmonary ROS, former smoker,    Pulmonary exam normal       Cardiovascular hypertension, Pt. on medications and Pt. on home beta blockers Rhythm:Regular Rate:Normal     Neuro/Psych negative neurological ROS     GI/Hepatic negative GI ROS, Neg liver ROS,   Endo/Other    Renal/GU negative Renal ROS     Musculoskeletal   Abdominal (+)  Abdomen: soft.    Peds  Hematology   Anesthesia Other Findings   Reproductive/Obstetrics                           Anesthesia Physical Anesthesia Plan  ASA: III  Anesthesia Plan: General   Post-op Pain Management:    Induction: Intravenous  Airway Management Planned: Oral ETT  Additional Equipment:   Intra-op Plan:   Post-operative Plan: Extubation in OR  Informed Consent: I have reviewed the patients History and Physical, chart, labs and discussed the procedure including the risks, benefits and alternatives for the proposed anesthesia with the patient or authorized representative who has indicated his/her understanding and acceptance.   Dental advisory given and History available from chart only  Plan Discussed with:   Anesthesia Plan Comments:         Anesthesia Quick Evaluation

## 2013-10-07 NOTE — H&P (Signed)
Tracey Rosario is an 80 y.o. female.   Chief Complaint: Right hip pain HPI:80 year old female status post mechanical fall yesterday. Denies loss of consciousness , dizziness or chest pain. Injury to right hip only no other injuries. Able to bear weight on right hip using cane or walker.   Past Medical History  Diagnosis Date  . Hypertension   . A-fib     Past Surgical History  Procedure Laterality Date  . Abdominal hysterectomy  1971  . Bladder surgery  2010  . Broken pelvis  2015    Family History  Problem Relation Age of Onset  . Prostate cancer Father   . Cancer Brother   . Cancer Sister   . Heart attack Sister   . Cancer Sister    Social History:  reports that she quit smoking about 43 years ago. She does not have any smokeless tobacco history on file. She reports that she does not drink alcohol or use illicit drugs.  Allergies:  Allergies  Allergen Reactions  . Codeine Other (See Comments)    Makes her crazy    Medications Prior to Admission  Medication Sig Dispense Refill  . aspirin 81 MG tablet Take 81 mg by mouth daily.      Marland Kitchen atenolol (TENORMIN) 50 MG tablet Take 50 mg by mouth daily.      . citalopram (CELEXA) 20 MG tablet Take 1 tablet (20 mg total) by mouth daily.  90 tablet  1  . Colesevelam HCl [facility]) 3.75 G PACK Take 1 packet by mouth daily.  90 packet  1  . esomeprazole (NEXIUM) 40 MG capsule Take 1 capsule (40 mg total) by mouth daily at 12 noon.  90 capsule  1  . niacin (NIASPAN) 500 MG CR tablet Take 1 tablet (500 mg total) by mouth at bedtime.  90 tablet  1  . PRESCRIPTION MEDICATION Take 1 tablet by mouth 2 (two) times daily as needed (for pain). Prescription med for pain      . PRESCRIPTION MEDICATION Take 1 tablet by mouth 3 (three) times daily as needed (for muscle spasms). Muscle relaxer      . Vitamin D, Ergocalciferol, (DRISDOL) 50000 UNITS CAPS capsule Take 1 capsule (50,000 Units total) by mouth every 7 (seven) days.  12 capsule  1  .  VITAMIN E PO Take 1 tablet by mouth daily.      Marland Kitchen lisinopril-hydrochlorothiazide (PRINZIDE,ZESTORETIC) 20-12.5 MG per tablet Take 1 tablet by mouth daily.  90 tablet  1  . Multiple Vitamin (MULTIVITAMIN WITH MINERALS) TABS tablet Take 1 tablet by mouth daily.        No results found for this or any previous visit (from the past 48 hour(s)). No results found.  Review of Systems  Constitutional: Negative.   Eyes: Positive for photophobia.  Cardiovascular: Negative for chest pain.       Positive for hypertension and atrial fibrillation   Gastrointestinal: Positive for heartburn.  Musculoskeletal: Positive for falls.       Right hip pain  Neurological: Positive for headaches. Negative for dizziness and loss of consciousness.  Psychiatric/Behavioral: Negative.     Blood pressure 141/43, pulse 53, temperature 98.2 F (36.8 C), temperature source Axillary, resp. rate 18, height 5\' 10"  (1.778 m), weight 73.2 kg (161 lb 6 oz), SpO2 99.00%. Physical Exam  Constitutional: She is oriented to person, place, and time. She appears well-developed and well-nourished.  HENT:  Head: Normocephalic and atraumatic.  Eyes: Pupils are equal, round,  and reactive to light.  Cardiovascular: Intact distal pulses.   Respiratory: Effort normal.  Musculoskeletal:  Right hip limited range of motion secondary to pain. Right calf supple non tender. Right lower leg without tenderness or deformity. Left hip good range of m.otion without pain  Neurological: She is alert and oriented to person, place, and time.  Psychiatric: She has a normal mood and affect.     Radiographs: Right hip lateral view and AP pelvis shows a minimally displaced right neck fracture ( Garden II ) . Lateral film shows no displacement.No other fractures identified.  Assessment/Plan Right hip minimally displaced neck fracture  Non weight bearing right hip NPO Admit for right hip percutaneous pinning.    CLARK, GILBERT 10/07/2013, 1:14  PM

## 2013-10-07 NOTE — Transfer of Care (Signed)
Immediate Anesthesia Transfer of Care Note  Patient: Tracey Rosario  Procedure(s) Performed: Procedure(s): CANNULATED screws right hip (Right)  Patient Location: PACU  Anesthesia Type:General  Level of Consciousness: awake, alert  and patient cooperative  Airway & Oxygen Therapy: Patient Spontanous Breathing and Patient connected to nasal cannula oxygen  Post-op Assessment: Report given to PACU RN and Patient moving all extremities  Post vital signs: Reviewed and stable  Complications: No apparent anesthesia complications

## 2013-10-07 NOTE — Anesthesia Procedure Notes (Signed)
Procedure Name: Intubation Date/Time: 10/07/2013 5:47 PM Performed by: Izora Gala Pre-anesthesia Checklist: Patient identified, Emergency Drugs available, Suction available and Patient being monitored Patient Re-evaluated:Patient Re-evaluated prior to inductionOxygen Delivery Method: Circle system utilized Preoxygenation: Pre-oxygenation with 100% oxygen Intubation Type: IV induction Laryngoscope Size: Miller and 3 Grade View: Grade II Tube type: Oral Tube size: 7.5 mm Number of attempts: 1 Airway Equipment and Method: Stylet and LTA kit utilized Placement Confirmation: ETT inserted through vocal cords under direct vision,  positive ETCO2 and breath sounds checked- equal and bilateral Secured at: 22 cm Tube secured with: Tape Dental Injury: Teeth and Oropharynx as per pre-operative assessment

## 2013-10-07 NOTE — Anesthesia Postprocedure Evaluation (Signed)
  Anesthesia Post-op Note  Patient: Tracey FillersNancy Chait  Procedure(s) Performed: Procedure(s): CANNULATED screws right hip (Right)  Patient Location: PACU  Anesthesia Type: General   Level of Consciousness: awake, alert  and oriented  Airway and Oxygen Therapy: Patient Spontanous Breathing  Post-op Pain: mild  Post-op Assessment: Post-op Vital signs reviewed  Post-op Vital Signs: Reviewed  Last Vitals:  Filed Vitals:   10/07/13 1954  BP: 119/47  Pulse: 56  Temp: 37 C  Resp: 15    Complications: No apparent anesthesia complications

## 2013-10-07 NOTE — H&P (Signed)
I have seen and examined the patient and agree with the H&P note.  Surgery has been discussed in great detail with her and her family.

## 2013-10-08 LAB — BASIC METABOLIC PANEL
Anion gap: 14 (ref 5–15)
BUN: 17 mg/dL (ref 6–23)
CHLORIDE: 96 meq/L (ref 96–112)
CO2: 25 meq/L (ref 19–32)
Calcium: 8.5 mg/dL (ref 8.4–10.5)
Creatinine, Ser: 0.97 mg/dL (ref 0.50–1.10)
GFR calc Af Amer: 60 mL/min — ABNORMAL LOW (ref >90)
GFR calc non Af Amer: 52 mL/min — ABNORMAL LOW (ref >90)
Glucose, Bld: 111 mg/dL — ABNORMAL HIGH (ref 70–99)
POTASSIUM: 4.2 meq/L (ref 3.7–5.3)
SODIUM: 135 meq/L — AB (ref 137–147)

## 2013-10-08 LAB — CBC
HCT: 28.2 % — ABNORMAL LOW (ref 36.0–46.0)
Hemoglobin: 9.5 g/dL — ABNORMAL LOW (ref 12.0–15.0)
MCH: 29.1 pg (ref 26.0–34.0)
MCHC: 33.7 g/dL (ref 30.0–36.0)
MCV: 86.2 fL (ref 78.0–100.0)
Platelets: 141 10*3/uL — ABNORMAL LOW (ref 150–400)
RBC: 3.27 MIL/uL — AB (ref 3.87–5.11)
RDW: 13.6 % (ref 11.5–15.5)
WBC: 7 10*3/uL (ref 4.0–10.5)

## 2013-10-08 MED ORDER — WHITE PETROLATUM GEL
Status: AC
  Filled 2013-10-08: qty 5

## 2013-10-08 NOTE — Progress Notes (Signed)
Utilization review completed.  

## 2013-10-08 NOTE — Evaluation (Signed)
Physical Therapy Evaluation Patient Details Name: Tracey Rosario MRN: [id number] DOB: [birth year] Today's Date: 10/08/2013   History of Present Illness  s/p fall resulting in R hip fx and pt underwent ORIF; PWB on RLE and h/o fall recently resulting in pelvic fx  Clinical Impression  Pt presents with the below list impairments and will benefit from skilled PT to address impairments and increase functional independence. At this time, recommending 24/7 S for safety and pt is unsure if this will be able to be provided by her daughter. If so, recommend short term SNF placement.     Follow Up Recommendations Home health PT;Supervision/Assistance - 24 hour (may need short term SNF if dghtr isn't able to provide 24/7)    Equipment Recommendations  None recommended by PT (pt already has RW)    Recommendations for Other Services OT consult     Precautions / Restrictions Precautions Precautions: Fall Restrictions Weight Bearing Restrictions: Yes RLE Weight Bearing: Partial weight bearing RLE Partial Weight Bearing Percentage or Pounds: 50      Mobility  Bed Mobility Overal bed mobility: Needs Assistance Bed Mobility: Supine to Sit     Supine to sit: Min assist     General bed mobility comments: cues for technique and min A to manage RLE  Transfers Overall transfer level: Needs assistance Equipment used: Rolling walker (2 wheeled) Transfers: Pharmacologisttand Pivot Transfers;Sit to/from Stand Sit to Stand: Mod assist Stand pivot transfers: Min assist       General transfer comment: verbal cues needed for sequence and technique as well as to maintain PWB precautions. Transferred OOB and on and off raised toilet seat.   Ambulation/Gait Ambulation/Gait assistance: Min assist Ambulation Distance (Feet): 10 Feet (x2) Assistive device: Rolling walker (2 wheeled) Gait Pattern/deviations: Step-to pattern;Decreased stance time - right;Antalgic Gait velocity: significantly decreased       Stairs            Wheelchair Mobility    Modified Rankin (Stroke Patients Only)       Balance Overall balance assessment: Needs assistance   Sitting balance-Leahy Scale: Fair       Standing balance-Leahy Scale: Poor                               Pertinent Vitals/Pain Pain Assessment: No/denies pain    Home Living Family/patient expects to be discharged to:: Private residence Living Arrangements: Alone Available Help at Discharge: Other (Comment) (pt unsure if daughter can stay with her 24/7) Type of Home: House Home Access: Stairs to enter Entrance Stairs-Rails: None Entrance Stairs-Number of Steps: 1 Home Layout: One level Home Equipment: Environmental consultantWalker - 2 wheels      Prior Function Level of Independence: Independent               Hand Dominance        Extremity/Trunk Assessment               Lower Extremity Assessment: RLE deficits/detail RLE Deficits / Details: Able to initiate against gravity grossly 3-/5 at hip    Cervical / Trunk Assessment: Kyphotic  Communication      Cognition Arousal/Alertness: Awake/alert Behavior During Therapy: WFL for tasks assessed/performed Overall Cognitive Status: Within Functional Limits for tasks assessed                      General Comments      Exercises General Exercises - Lower Extremity Ankle Circles/Pumps:  AROM;Both;10 reps Long Arc Quad: AROM;Strengthening;Right;10 reps Hip Flexion/Marching: AROM;Strengthening;Both;10 reps Toe Raises: AROM;Strengthening;Both;10 reps Heel Raises: AROM;Strengthening;Both;10 reps      Assessment/Plan    PT Assessment Patient needs continued PT services  PT Diagnosis Difficulty walking;Acute pain   PT Problem List Decreased strength;Decreased range of motion;Decreased activity tolerance;Decreased balance;Decreased mobility;Decreased knowledge of use of DME;Decreased knowledge of precautions  PT Treatment Interventions DME  instruction;Gait training;Stair training;Functional mobility training;Therapeutic activities;Therapeutic exercise;Balance training;Patient/family education   PT Goals (Current goals can be found in the Care Plan section) Acute Rehab PT Goals Patient Stated Goal: get out of bed PT Goal Formulation: With patient Time For Goal Achievement: 10/15/13 Potential to Achieve Goals: Good    Frequency Min 5X/week   Barriers to discharge Decreased caregiver support Pt reports she is unsure if her daughter will be able to provide 24/7 S; Recommending this at this time due to WB restrictions and h/o falls    Co-evaluation               End of Session Equipment Utilized During Treatment: Gait belt Activity Tolerance: Patient tolerated treatment well Patient left: in chair;with call bell/phone within reach Nurse Communication: Mobility status;Weight bearing status         Time: 1000-1031 PT Time Calculation (min): 31 min   Charges:   PT Evaluation $Initial PT Evaluation Tier I: 1 Procedure PT Treatments $Therapeutic Exercise: 8-22 mins $Therapeutic Activity: 8-22 mins   PT G Codes:          Tedd Sias 10/08/2013, 12:32 PM

## 2013-10-08 NOTE — Op Note (Signed)
Tracey Rosario, Tracey Rosario            ACCOUNT NO.:  [id number]  MEDICAL RECORD NO.:  [id number]  LOCATION:  5N30C                        FACILITY:  [facility]  PHYSICIAN:  Vanita Panda. Magnus Ivan, M.D.DATE OF BIRTH:  01-19-1926  DATE OF PROCEDURE:  10/07/2013 DATE OF DISCHARGE:                              OPERATIVE REPORT   PREOPERATIVE DIAGNOSIS:  Nondisplaced right hip femoral neck fracture.  POSTOPERATIVE DIAGNOSIS:  Nondisplaced right hip femoral neck fracture.  PROCEDURE:  Open reduction and internal fixation of right hip nondisplaced closed femoral neck fracture initial encountered.  IMPLANTS:  Biomet cannulated screws.  SURGEON:  Vanita Panda. Magnus Ivan, M.D.  ANESTHESIA:  General.  ANTIBIOTICS:  2 g IV Ancef.  BLOOD LOSS:  Less than 100 mL.  COMPLICATIONS:  None.  INDICATIONS:  Tracey Rosario is an 80 year old female who ambulates with a walker.  She sustained a mechanical fall at home yesterday and came in the office today just to make sure her pelvis is doing fine.  She had previous pelvic insufficiency fracture.  She was able to ambulate on her right hip but reported right hip pain.  I can put it through flexion, extension, and seem to hurt much but internal and external rotation was slightly painful.  Her leg lengths appeared equal.  However, AP and lateral of the right hip did show a nondisplaced femoral neck fracture. Given the slight impaction of this fracture but the nondisplaced nature, I recommended cannulated screw fixation with partial weightbearing.  Her family understand the risk of nonunion and osteonecrosis as well as acute blood loss anemia, nerve and vessel injury, and DVT and PE.  She did understand the goals were improved mobility quickly, decreased pain, and overall improved quality of life.  PROCEDURE DESCRIPTION:  After informed consent was obtained, appropriate right hip was marked.  She was brought to the operating room and general anesthesia  was obtained while she was on her stretcher.  She was next placed supine on the fracture table with the perineal post in place. Her right leg in inline skeletal traction but no traction applied.  Her left hip flexed and abducted in a special stirrup with appropriate padding in the popliteal area.  Her right hip was then assessed radiographically and we found the fracture to essentially remain nondisplaced.  We then prepped the right hip with DuraPrep and sterile drapes.  A time-out was called and she was identified as correct patient, correct right hip.  I then made a small incision along the lateral femur and I placed a 3 type temporary guide pins in an inverted triangle format into the proximal femur traversing the fracture into the femoral head.  I took measurement off of these and then placed 3 Biomet cannulated screws with the inferior 1 plate measuring 95 mm in the superior-anterior and superior-posterior both measuring 90 mm.  These were partially threaded and I got excellent bite on the screws when we tightened them down.  I put her head through internal and external rotation under direct fluoroscopy and in the femoral head and neck moved as a unit.  We then removed all guide pins.  I copiously irrigated the small wound with normal saline solution.  I closed the  deep tissue with 0 Vicryl followed by 2-0 Vicryl in subcutaneous tissue, interrupted staples on the skin.  Xeroform and well-padded sterile dressing were applied.  She was awakened, extubated, and taken to recovery room in stable condition.  All final counts were correct.  There were no complications noted.  Postoperatively, we will let her 50% weightbear.     Vanita Pandahristopher Y. Magnus Rosario, M.D.     CYB/MEDQ  D:  10/07/2013  T:  10/08/2013  Job:  [id number]

## 2013-10-08 NOTE — Progress Notes (Signed)
Patient ID: Tracey Rosario, female   DOB: 12/23/1926, 80 y.o.   MRN: [id number] I spoke with her in length. Therapy is going well.  She may need short-term SNF.  [facility] so[facility]  I anticipate discharge this Thursday.

## 2013-10-08 NOTE — Progress Notes (Signed)
Subjective: 1 Day Post-Op Procedure(s) (LRB): CANNULATED screws right hip (Right) Patient reports pain as mild.  Tolerated surgery well.  Objective: Vital signs in last 24 hours: Temp:  [98.2 F (36.8 C)-98.8 F (37.1 C)] 98.8 F (37.1 C) (10/06 0610) Pulse Rate:  [53-61] 58 (10/06 0610) Resp:  [11-18] 15 (10/06 0610) BP: (118-167)/(39-64) 137/43 mmHg (10/06 0610) SpO2:  [94 %-100 %] 94 % (10/06 0610) Weight:  [73.2 kg (161 lb 6 oz)] 73.2 kg (161 lb 6 oz) (10/05 1229)  Intake/Output from previous day: 10/05 0701 - 10/06 0700 In: 1640 [P.O.:240; I.V.:1400] Out: 100 [Urine:100] Intake/Output this shift:     Recent Labs  10/07/13 1300 10/08/13 0525  HGB 10.9* 9.5*    Recent Labs  10/07/13 1300 10/08/13 0525  WBC 7.5 7.0  RBC 3.78* 3.27*  HCT 32.3* 28.2*  PLT 143* 141*    Recent Labs  10/07/13 1300 10/08/13 0525  NA 138 135*  K 4.7 4.2  CL 100 96  CO2 25 25  BUN 19 17  CREATININE 0.99 0.97  GLUCOSE 86 111*  CALCIUM 9.2 8.5    Recent Labs  10/07/13 1300  INR 1.11    Sensation intact distally Intact pulses distally Dorsiflexion/Plantar flexion intact Incision: scant drainage  Assessment/Plan: 1 Day Post-Op Procedure(s) (LRB): CANNULATED screws right hip (Right) Up with therapy, up to 50% weight only right hip. Discharge when clears therapy - ST-SNF vs home pending progress with therapy.  Laquashia Mergenthaler Y 10/08/2013, 7:17 AM

## 2013-10-09 LAB — BASIC METABOLIC PANEL
Anion gap: 12 (ref 5–15)
BUN: 14 mg/dL (ref 6–23)
CHLORIDE: 95 meq/L — AB (ref 96–112)
CO2: 24 mEq/L (ref 19–32)
Calcium: 8.6 mg/dL (ref 8.4–10.5)
Creatinine, Ser: 0.92 mg/dL (ref 0.50–1.10)
GFR calc non Af Amer: 55 mL/min — ABNORMAL LOW (ref >90)
GFR, EST AFRICAN AMERICAN: 64 mL/min — AB (ref >90)
GLUCOSE: 93 mg/dL (ref 70–99)
POTASSIUM: 3.9 meq/L (ref 3.7–5.3)
SODIUM: 131 meq/L — AB (ref 137–147)

## 2013-10-09 LAB — CBC
HEMATOCRIT: 25.2 % — AB (ref 36.0–46.0)
HEMOGLOBIN: 8.5 g/dL — AB (ref 12.0–15.0)
MCH: 28.8 pg (ref 26.0–34.0)
MCHC: 33.7 g/dL (ref 30.0–36.0)
MCV: 85.4 fL (ref 78.0–100.0)
Platelets: 127 10*3/uL — ABNORMAL LOW (ref 150–400)
RBC: 2.95 MIL/uL — ABNORMAL LOW (ref 3.87–5.11)
RDW: 13.4 % (ref 11.5–15.5)
WBC: 5.3 10*3/uL (ref 4.0–10.5)

## 2013-10-09 MED ORDER — INFLUENZA VAC SPLIT QUAD 0.5 ML IM SUSY
0.5000 mL | PREFILLED_SYRINGE | INTRAMUSCULAR | Status: AC
  Administered 2013-10-10: 0.5 mL via INTRAMUSCULAR
  Filled 2013-10-09: qty 0.5

## 2013-10-09 NOTE — Clinical Social Work Placement (Signed)
Clinical Social Work Department CLINICAL SOCIAL WORK PLACEMENT NOTE 10/09/2013  Patient:  Tracey Rosario,Tracey Rosario  Account Number:  [id number] Admit date:  10/07/2013  Clinical Social Worker:  Lavell LusterJOSEPH BRYANT Huriel Matt, LCSWA  Date/time:  10/09/2013 11:28 AM  Clinical Social Work is seeking post-discharge placement for this patient at the following level of care:   SKILLED NURSING   (*CSW will update this form in Epic as items are completed)   10/09/2013  [facility]/family provided with [facility] Work's list of facilities offering this level of care within the geographic area requested by the patient (or if unable, by the patient's family).  10/09/2013  Patient/family informed of their freedom to choose among providers that offer the needed level of care, that participate in Medicare, Medicaid or managed care program needed by the patient, have an available bed and are willing to accept the patient.  10/09/2013  Patient/family informed of [facility]' ownership interest in [facility], as well as of the fact that they are under no obligation to receive care at this facility.  PASARR submitted to EDS on 10/09/2013 PASARR number received on 10/09/2013  FL2 transmitted to all facilities in geographic area requested by pt/family on  10/09/2013 FL2 transmitted to all facilities within larger geographic area on   Patient informed that his/her managed care company has contracts with or will negotiate with  certain facilities, including the following:     Patient/family informed of bed offers received:   Patient chooses bed at  Physician recommends and patient chooses bed at    Patient to be transferred to  on   Patient to be transferred to facility by  Patient and family notified of transfer on  Name of family member notified:    The following physician request were entered in Epic:   Additional Comments:    Roddie McBryant Aelyn Stanaland MSW, LesterLCSWA, StebbinsLCASA,  [phone]

## 2013-10-09 NOTE — Clinical Social Work Psychosocial (Signed)
Clinical Social Work Department BRIEF PSYCHOSOCIAL ASSESSMENT 10/09/2013  Patient:  Tracey Rosario, Tracey Rosario     Account Number:  [id number]     Admit date:  10/07/2013  Clinical Social Worker:  Lovey Newcomer  Date/Time:  10/09/2013 11:20 AM  Referred by:  Physician  Date Referred:  10/09/2013 Referred for  SNF Placement   Other Referral:   NA   Interview type:  Patient Other interview type:   Patient alert and oriented at time of assessment. Daughter also involved in assessment.    PSYCHOSOCIAL DATA Living Status:  ALONE Admitted from facility:   Level of care:   Primary support name:  [name] Primary support relationship to patient:  CHILD, ADULT Degree of support available:   Support is strong.    CURRENT CONCERNS Current Concerns  Post-Acute Placement   Other Concerns:   NA    SOCIAL WORK ASSESSMENT / PLAN CSW met with patient and daughter at bedside to complete assessment. Patient states that she plans to DC to [facility] where she has been to rehab before in the past. Patient reports that she lives alone, but her daughter lives across the street and is a great support for her. Patient was calm and engaged in assessment. Patient appears optimistic that she will be able to return home soon after short term rehab. CSW will assist.   Assessment/plan status:  Psychosocial Support/Ongoing Assessment of Needs Other assessment/ plan:   Complete Fl2, Fax, PASRR   Information/referral to community resources:   CSW contact information and SNF list given.    PATIENT'S/FAMILY'S RESPONSE TO PLAN OF CARE: Patient plans to DC to [facility] once stable. CSW will assist.       Liz Beach MSW, [place], [place], [phone]

## 2013-10-09 NOTE — Progress Notes (Addendum)
Occupational Therapy Evaluation Patient Details Name: Tracey Rosario MRN: [id number] DOB: 12-Jan-1926 Today's Date: 10/09/2013    History of Present Illness s/p fall resulting in R hip fx and pt underwent ORIF; PWB on RLE and h/o fall in May resulting in pelvic fx   Clinical Impression   PTA pt lived at home alone. She had a fall in May resulting in pelvic fx and stayed at [facility] for rehab. Pt has been home since that time, however experienced another fall. Pt is a high fall risk due to history and current limitations in ROM and balance. Pt requires assist for LB ADLs as well as functional transfers and demonstrates generalized weakness. Pt will benefit from acute OT to increase strength to promote independence with functional transfers and ADLs. Feel that [facility] and 24/7 care, however if 24/7 supervision is not available, pt will need SNF placement. Update: Pt/daughter feel that SNF would be best option and OT agrees at this time.     Follow Up Recommendations  SNF; Supervision/Assistance - 24 hour   Equipment Recommendations  Tub/shower bench    Recommendations for Other Services       Precautions / Restrictions Precautions Precautions: Fall Restrictions Weight Bearing Restrictions: Yes RLE Weight Bearing: Partial weight bearing RLE Partial Weight Bearing Percentage or Pounds: 50      Mobility Bed Mobility               General bed mobility comments: Pt sitting up on BSC when OT arrived and returned to recliner.   Transfers Overall transfer level: Needs assistance Equipment used: Rolling walker (2 wheeled) Transfers: Sit to/from Stand Sit to Stand: Min assist;From elevated surface (BSC)         General transfer comment: Min (A) to power up and stabilize in standing. Pt requires VC's to manage RLE and for sequencing.     Balance Overall balance assessment: History of Falls;Needs assistance Sitting-balance support: No upper  extremity supported;Feet supported Sitting balance-Leahy Scale: Fair     Standing balance support: Bilateral upper extremity supported;During functional activity Standing balance-Leahy Scale: Poor                              ADL Overall ADL's : Needs assistance/impaired Eating/Feeding: Independent;Sitting   Grooming: Set up;Oral care;Wash/dry hands;Sitting   Upper Body Bathing: Set up;Sitting   Lower Body Bathing: Sit to/from stand;Moderate assistance   Upper Body Dressing : Set up;Sitting   Lower Body Dressing: Maximal assistance;Sit to/from stand   Toilet Transfer: Minimal assistance;Ambulation;BSC;RW   Toileting- Clothing Manipulation and Hygiene: Minimal assistance;Sit to/from stand       Functional mobility during ADLs: Minimal assistance;Rolling walker General ADL Comments: Pt requires VC's for RLE and min (A) to manage RW. Pt limited in LB ADLs due to soreness from surgery. Feel that pt is a high fall risk and would need 24/7 Supervision at home.      Vision  Pt reports no change from baseline.                    Perception Perception Perception Tested?: No   Praxis Praxis Praxis tested?: Within functional limits    Pertinent Vitals/Pain Pain Assessment: No/denies pain     Hand Dominance Right   Extremity/Trunk Assessment Upper Extremity Assessment Upper Extremity Assessment: Generalized weakness   Lower Extremity Assessment Lower Extremity Assessment: Defer to PT evaluation   Cervical /  Trunk Assessment Cervical / Trunk Assessment: Kyphotic   Communication Communication Communication: No difficulties   Cognition Arousal/Alertness: Awake/alert Behavior During Therapy: WFL for tasks assessed/performed Overall Cognitive Status: Within Functional Limits for tasks assessed                     General Comments    Pt's daughter is attempting to find 24/7 care for mother for her to return home with [facility] therapies. Pt's  daughter expressed desire to meet with CSW and CM today to discuss options for home aide.             Home Living Family/patient expects to be discharged to:: Private residence Living Arrangements: Alone Available Help at Discharge: Other (Comment) (daughter attempting to set up 24/7 care) Type of Home: House Home Access: Stairs to enter [employer] of Steps: 1 Entrance Stairs-Rails: None Home Layout: One level     Bathroom Shower/Tub: Tub/shower unit Shower/tub characteristics: Curtain Firefighter: Standard     Home Equipment: Environmental consultant - 2 wheels;Bedside commode;Shower seat          Prior Functioning/Environment Level of Independence: Independent             OT Diagnosis: Generalized weakness;Acute pain   OT Problem List: Decreased strength;Decreased range of motion;Decreased activity tolerance;Impaired balance (sitting and/or standing);Decreased safety awareness;Decreased knowledge of precautions;Pain   OT Treatment/Interventions: Self-care/ADL training;Therapeutic exercise;Energy conservation;DME and/or AE instruction;Therapeutic activities;Balance training;Patient/family education    OT Goals(Current goals can be found in the care plan section) Acute Rehab OT Goals Patient Stated Goal: to go home or to [place] OT Goal Formulation: With patient Time For Goal Achievement: 10/23/13 Potential to Achieve Goals: Good  OT Frequency: Min 2X/week    End of Session Equipment Utilized During Treatment: Gait belt;Rolling walker  Activity Tolerance: Patient tolerated treatment well Patient left: in chair;with call bell/phone within reach;with family/visitor present   Time: 1610-9604 OT Time Calculation (min): 17 min Charges:  OT General Charges $OT Visit: 1 Procedure OT Evaluation $Initial OT Evaluation Tier I: 1 Procedure OT Treatments $Self Care/Home Management : 8-22 mins  Nena Jordan M 10/09/2013, 9:25 AM  [place] [facility],  OTR/L Occupational Therapist [phone] (pager)

## 2013-10-09 NOTE — Progress Notes (Signed)
Physical Therapy Treatment Patient Details Name: Tracey Rosario MRN: [id number] DOB: May 29, 1926 Today's Date: 10/09/2013    History of Present Illness s/p fall resulting in R hip fx and pt underwent ORIF; PWB on RLE and h/o fall in May resulting in pelvic fx    PT Comments    Patient is progressing with ambulation this session. Cues to ensure 50% PWB. Daughter present for session this AM and they have decided that the best option is for patient to go to SNF for continued therapy as daughter cannot provide 24/7 assist as needed. Patient is requesting [facility] for rehab as she has been there in the past.   Follow Up Recommendations  SNF     Equipment Recommendations  None recommended by PT    Recommendations for Other Services       Precautions / Restrictions Precautions Precautions: Fall Restrictions Weight Bearing Restrictions: Yes RLE Weight Bearing: Partial weight bearing RLE Partial Weight Bearing Percentage or Pounds: 50    Mobility  Bed Mobility               General bed mobility comments: Pt up in recliner upon entering room  Transfers Overall transfer level: Needs assistance Equipment used: Rolling walker (2 wheeled) Transfers: Sit to/from Stand Sit to Stand: Min assist         General transfer comment: Min (A) to power up and stabilize in standing. Pt requires VC's for safe technique and hand placement  Ambulation/Gait Ambulation/Gait assistance: Min assist Ambulation Distance (Feet): 60 Feet Assistive device: Rolling walker (2 wheeled) Gait Pattern/deviations: Step-to pattern;Decreased step length - left;Decreased stance time - right Gait velocity: decreased       Stairs            Wheelchair Mobility    Modified Rankin (Stroke Patients Only)       Balance Overall balance assessment: History of Falls;Needs assistance Sitting-balance support: No upper extremity supported;Feet supported Sitting balance-Leahy Scale: Fair      Standing balance support: Bilateral upper extremity supported;During functional activity Standing balance-Leahy Scale: Poor                      Cognition Arousal/Alertness: Awake/alert Behavior During Therapy: WFL for tasks assessed/performed Overall Cognitive Status: Within Functional Limits for tasks assessed                      Exercises General Exercises - Lower Extremity Quad Sets: AROM;Right;10 reps Long Arc Quad: AROM;Strengthening;Right;10 reps Heel Slides: AAROM;Right;10 reps Hip ABduction/ADduction: AAROM;Right;10 reps    General Comments General comments (skin integrity, edema, etc.): Pt's daughter is attempting to find 24/7 care for mother for [facility]. Pt's daughter expressed desire to meet with CSW and CM today to discuss options for home aide.       Pertinent Vitals/Pain Pain Assessment: No/denies pain    Home Living Family/patient expects to be discharged to:: Private residence Living Arrangements: Alone Available Help at Discharge: Other (Comment) (daughter attempting to set up 24/7 care) Type of Home: House Home Access: Stairs to enter Entrance Stairs-Rails: None Home Layout: One level Home Equipment: Environmental consultantWalker - 2 wheels;Bedside commode;Shower seat      Prior Function Level of Independence: Independent          PT Goals (current goals can now be found in the care plan section) Acute Rehab PT Goals Patient Stated Goal: [facility] Progress towards PT goals: Progressing  toward goals    Frequency  Min 5X/week    PT Plan Discharge plan needs to be updated    Co-evaluation             End of Session Equipment Utilized During Treatment: Gait belt Activity Tolerance: Patient tolerated treatment well Patient left: in chair;with call bell/phone within reach     Time: 1005-1028 PT Time Calculation (min): 23 min  Charges:  $Gait Training: 8-22 mins $Therapeutic Exercise: 8-22 mins                     G Codes:      Fredrich Birks 10/09/2013, 10:31 AM 10/09/2013 Fredrich Birks PTA [phone] pager [phone] office

## 2013-10-09 NOTE — Progress Notes (Signed)
Subjective: 2 Days Post-Op Procedure(s) (LRB): CANNULATED screws right hip (Right) Patient reports pain as moderate.  Only pain when up.  Asymptomatic acute blood loss anemia from her fracture and surgery.  Objective: Vital signs in last 24 hours: Temp:  [97.6 F (36.4 C)-99 F (37.2 C)] 97.8 F (36.6 C) (10/07 0600) Pulse Rate:  [60-80] 62 (10/07 0600) Resp:  [16-18] 18 (10/07 0600) BP: (121-144)/(33-53) 144/50 mmHg (10/07 0600) SpO2:  [91 %-95 %] 93 % (10/07 0600)  Intake/Output from previous day: 10/06 0701 - 10/07 0700 In: 630 [P.O.:630] Out: 360 [Urine:360] Intake/Output this shift:     Recent Labs  10/07/13 1300 10/08/13 0525  HGB 10.9* 9.5*    Recent Labs  10/07/13 1300 10/08/13 0525  WBC 7.5 7.0  RBC 3.78* 3.27*  HCT 32.3* 28.2*  PLT 143* 141*    Recent Labs  10/07/13 1300 10/08/13 0525  NA 138 135*  K 4.7 4.2  CL 100 96  CO2 25 25  BUN 19 17  CREATININE 0.99 0.97  GLUCOSE 86 111*  CALCIUM 9.2 8.5    Recent Labs  10/07/13 1300  INR 1.11    Sensation intact distally Intact pulses distally Dorsiflexion/Plantar flexion intact Incision: dressing C/D/I  Assessment/Plan: 2 Days Post-Op Procedure(s) (LRB): CANNULATED screws right hip (Right) Up with therapy Plan for discharge tomorrow Home health vs SNF  BLACKMAN,CHRISTOPHER Y 10/09/2013, 7:06 AM

## 2013-10-10 DIAGNOSIS — E785 Hyperlipidemia, unspecified: Secondary | ICD-10-CM | POA: Diagnosis not present

## 2013-10-10 DIAGNOSIS — W19XXXA Unspecified fall, initial encounter: Secondary | ICD-10-CM | POA: Diagnosis present

## 2013-10-10 DIAGNOSIS — S72011D Unspecified intracapsular fracture of right femur, subsequent encounter for closed fracture with routine healing: Secondary | ICD-10-CM | POA: Diagnosis not present

## 2013-10-10 DIAGNOSIS — S72001D Fracture of unspecified part of neck of right femur, subsequent encounter for closed fracture with routine healing: Secondary | ICD-10-CM | POA: Diagnosis not present

## 2013-10-10 DIAGNOSIS — D62 Acute posthemorrhagic anemia: Secondary | ICD-10-CM | POA: Diagnosis not present

## 2013-10-10 DIAGNOSIS — R531 Weakness: Secondary | ICD-10-CM | POA: Diagnosis not present

## 2013-10-10 DIAGNOSIS — Z8249 Family history of ischemic heart disease and other diseases of the circulatory system: Secondary | ICD-10-CM | POA: Diagnosis not present

## 2013-10-10 DIAGNOSIS — K219 Gastro-esophageal reflux disease without esophagitis: Secondary | ICD-10-CM | POA: Diagnosis not present

## 2013-10-10 DIAGNOSIS — R278 Other lack of coordination: Secondary | ICD-10-CM | POA: Diagnosis not present

## 2013-10-10 DIAGNOSIS — I482 Chronic atrial fibrillation: Secondary | ICD-10-CM | POA: Diagnosis not present

## 2013-10-10 DIAGNOSIS — S72141S Displaced intertrochanteric fracture of right femur, sequela: Secondary | ICD-10-CM | POA: Diagnosis not present

## 2013-10-10 DIAGNOSIS — I10 Essential (primary) hypertension: Secondary | ICD-10-CM | POA: Diagnosis not present

## 2013-10-10 DIAGNOSIS — Z79899 Other long term (current) drug therapy: Secondary | ICD-10-CM | POA: Diagnosis not present

## 2013-10-10 DIAGNOSIS — Z967 Presence of other bone and tendon implants: Secondary | ICD-10-CM | POA: Diagnosis not present

## 2013-10-10 DIAGNOSIS — Z9181 History of falling: Secondary | ICD-10-CM | POA: Diagnosis not present

## 2013-10-10 DIAGNOSIS — Z87891 Personal history of nicotine dependence: Secondary | ICD-10-CM | POA: Diagnosis not present

## 2013-10-10 DIAGNOSIS — Y92019 Unspecified place in single-family (private) house as the place of occurrence of the external cause: Secondary | ICD-10-CM | POA: Diagnosis not present

## 2013-10-10 DIAGNOSIS — S72011A Unspecified intracapsular fracture of right femur, initial encounter for closed fracture: Secondary | ICD-10-CM | POA: Diagnosis not present

## 2013-10-10 DIAGNOSIS — Z7982 Long term (current) use of aspirin: Secondary | ICD-10-CM | POA: Diagnosis not present

## 2013-10-10 DIAGNOSIS — I4891 Unspecified atrial fibrillation: Secondary | ICD-10-CM | POA: Diagnosis not present

## 2013-10-10 DIAGNOSIS — M25551 Pain in right hip: Secondary | ICD-10-CM | POA: Diagnosis present

## 2013-10-10 LAB — CBC
HCT: 24 % — ABNORMAL LOW (ref 36.0–46.0)
HEMOGLOBIN: 8.2 g/dL — AB (ref 12.0–15.0)
MCH: 29.3 pg (ref 26.0–34.0)
MCHC: 34.2 g/dL (ref 30.0–36.0)
MCV: 85.7 fL (ref 78.0–100.0)
PLATELETS: 158 10*3/uL (ref 150–400)
RBC: 2.8 MIL/uL — AB (ref 3.87–5.11)
RDW: 13.4 % (ref 11.5–15.5)
WBC: 5.8 10*3/uL (ref 4.0–10.5)

## 2013-10-10 MED ORDER — ASPIRIN 325 MG PO TBEC: 325.0000 mg | DELAYED_RELEASE_TABLET | Freq: Every day | ORAL | Status: DC

## 2013-10-10 MED ORDER — DSS 100 MG PO CAPS: 100.0000 mg | ORAL_CAPSULE | Freq: Two times a day (BID) | ORAL | Status: DC

## 2013-10-10 MED ORDER — HYDROCODONE-ACETAMINOPHEN 5-325 MG PO TABS: 1.0000 | ORAL_TABLET | Freq: Four times a day (QID) | ORAL | Status: DC | PRN

## 2013-10-10 NOTE — Progress Notes (Signed)
Subjective: 3 Days Post-Op Procedure(s) (LRB): CANNULATED screws right hip (Right) Patient reports pain as mild.  Sitting up on edge of bed eating breakfast no complaints.  Objective: Vital signs in last 24 hours: Temp:  [98.1 F (36.7 C)-98.6 F (37 C)] 98.1 F (36.7 C) (10/08 0535) Pulse Rate:  [53-61] 61 (10/08 0535) Resp:  [16-18] 16 (10/08 0535) BP: (110-125)/(44-78) 125/44 mmHg (10/08 0535) SpO2:  [89 %-98 %] 89 % (10/08 0535)  Intake/Output from previous day: 10/07 0701 - 10/08 0700 In: 450 [P.O.:450] Out: -  Intake/Output this shift:     Recent Labs  10/07/13 1300 10/08/13 0525 10/09/13 0810 10/10/13 0527  HGB 10.9* 9.5* 8.5* 8.2*    Recent Labs  10/09/13 0810 10/10/13 0527  WBC 5.3 5.8  RBC 2.95* 2.80*  HCT 25.2* 24.0*  PLT 127* 158    Recent Labs  10/08/13 0525 10/09/13 0810  NA 135* 131*  K 4.2 3.9  CL 96 95*  CO2 25 24  BUN 17 14  CREATININE 0.97 0.92  GLUCOSE 111* 93  CALCIUM 8.5 8.6    Recent Labs  10/07/13 1300  INR 1.11   Right lower extremity; Sensation intact distally Intact pulses distally Dorsiflexion/Plantar flexion intact Incision: dressing C/D/I Compartment soft  Assessment/Plan: 3 Days Post-Op Procedure(s) (LRB): CANNULATED screws right hip (Right) Discharge to SNF ABLA secondary to fracture/ ORIF right hip fracture without symptoms  CLARK, GILBERT 10/10/2013, 8:26 AM

## 2013-10-10 NOTE — Clinical Social Work Note (Signed)
FL2 not signed by MD, CSW requested assistance from Diplomatic Services operational officersecretary with practice. CSW requested that RN notify CSW once FL2 has been signed so that CSW can schedule patient's transport.   Roddie McBryant Cap Massi MSW, [place], [place], [phone]

## 2013-10-10 NOTE — Clinical Social Work Psychosocial (Signed)
Clinical Social Work Department CLINICAL SOCIAL WORK PLACEMENT NOTE 10/10/2013  Patient:  Tracey Rosario,Tracey Rosario  Account Number:  [id number] Admit date:  10/07/2013  Clinical Social Worker:  Lavell LusterJOSEPH BRYANT Niley Helbig, LCSWA  Date/time:  10/09/2013 11:28 AM  Clinical Social Work is seeking post-discharge placement for this patient at the following level of care:   SKILLED NURSING   (*CSW will update this form in Epic as items are completed)   10/09/2013  Patient/family provided with [facility] Department of Clinical Social Work's list of facilities offering this level of care within the geographic area requested by the patient (or if unable, by the patient's family).  10/09/2013  Patient/family informed of their freedom to choose among providers that offer the needed level of care, that participate in Medicare, Medicaid or managed care program needed by the patient, have an available bed and are willing to accept the patient.  10/09/2013  Patient/family informed of [facility]' ownership interest in [facility], as well as of the fact that they are under no obligation to receive care at this facility.  PASARR submitted to EDS on 10/09/2013 PASARR number received on 10/09/2013  FL2 transmitted to all facilities in geographic area requested by pt/family on  10/09/2013 FL2 transmitted to all facilities within larger geographic area on   Patient informed that his/her managed care company has contracts with or will negotiate with  certain facilities, including the following:     Patient/family informed of bed offers received:  10/10/2013 [facility] Physician recommends and patient chooses bed at    [facility]  10/10/2013 Patient to be transferred to facility by Ambulance Patient and family notified of transfer on 10/10/2013 Name of family member notified:  Adolm JosephSusan Cooke  The following physician request were entered in  Epic:   Additional Comments:  Fl2 has been signed.  Per MD [facility]. RN, patient, patient's family, and facility notified of DC. RN given number for report. DC packet on chart. AMbulance transport requested for patient. CSW signing off.    Roddie McBryant Kendelle Schweers MSW, [place]CSWA, [place], [phone]

## 2013-10-10 NOTE — Progress Notes (Signed)
Patient daughter stated "I am sick and tired of waiting, we have waited all day for the ambulance.  Im just gonna take her myself"  Patient left with daughter, charge RN notified.

## 2013-10-10 NOTE — Progress Notes (Signed)
Physical Therapy Treatment Patient Details Name: Tracey Rosario MRN: [id number] DOB: [birth year] Today's Date: 10/10/2013    History of Present Illness s/p fall resulting in R hip fx and pt underwent ORIF; PWB on RLE and h/o fall in May resulting in pelvic fx    PT Comments    Patient is progressing well with ambulation. She is awaiting SNF at this time.   Follow Up Recommendations  SNF     Equipment Recommendations  None recommended by PT    Recommendations for Other Services       Precautions / Restrictions Precautions Precautions: Fall Restrictions Weight Bearing Restrictions: Yes RLE Weight Bearing: Partial weight bearing RLE Partial Weight Bearing Percentage or Pounds: 50    Mobility  Bed Mobility               General bed mobility comments: Pt up in recliner upon entering room  Transfers Overall transfer level: Needs assistance Equipment used: Rolling walker (2 wheeled)   Sit to Stand: Min assist         General transfer comment: Min (A) to power up and stabilize in standing. Pt requires VC's for safe technique and hand placement  Ambulation/Gait Ambulation/Gait assistance: Min guard Ambulation Distance (Feet): 80 Feet Assistive device: Rolling walker (2 wheeled)   Gait velocity: decreased       Stairs            Wheelchair Mobility    Modified Rankin (Stroke Patients Only)       Balance                                    Cognition Arousal/Alertness: Awake/alert Behavior During Therapy: WFL for tasks assessed/performed Overall Cognitive Status: Within Functional Limits for tasks assessed                      Exercises General Exercises - Lower Extremity Long Arc Quad: AROM;Strengthening;Right;10 reps    General Comments        Pertinent Vitals/Pain Pain Assessment: No/denies pain    Home Living                      Prior Function            PT Goals (current goals can now be  found in the care plan section) Progress towards PT goals: Progressing toward goals    Frequency  Min 5X/week    PT Plan Current plan remains appropriate    Co-evaluation             End of Session Equipment Utilized During Treatment: Gait belt Activity Tolerance: Patient tolerated treatment well Patient left: in chair;with call bell/phone within reach     Time: [id number] PT Time Calculation (min): 17 min  Charges:  $Gait Training: 8-22 mins                    G Codes:      Fredrich BirksRobinette, [name] 10/10/2013, 11:56 AM  10/10/2013 Fredrich Birksobinette, [name] PTA [phone] pager [phone] office

## 2013-10-10 NOTE — Discharge Summary (Signed)
Patient ID: Araya Roel MRN: [id number] DOB/AGE: 11-Feb-1926 80 y.o.  Admit date: 10/07/2013 Discharge date: 10/10/2013  Admission Diagnoses:  Active Problems:   Fracture of right hip requiring operative repair   Discharge Diagnoses:  Same  Past Medical History  Diagnosis Date  . Hypertension   . A-fib     Surgeries: Procedure(s): CANNULATED screws right hip on 10/07/2013   Consultants:  PT  Discharged Condition: Improved  Hospital Course: Shana Zavaleta is an 80 y.o. female who was admitted 10/07/2013 for operative treatment of<principal problem not specified>. Patient has severe unremitting pain that affects sleep, daily activities, and work/hobbies. After pre-op clearance the patient was taken to the operating room on 10/07/2013 and underwent  Procedure(s): CANNULATED screws right hip.    Patient was given perioperative antibiotics: Anti-infectives   Start     Dose/Rate Route Frequency Ordered Stop   10/07/13 2300  ceFAZolin (ANCEF) IVPB 2 g/50 mL premix     2 g 100 mL/hr over 30 Minutes Intravenous Every 6 hours 10/07/13 1953 10/08/13 0528   10/07/13 1715  ceFAZolin (ANCEF) IVPB 2 g/50 mL premix     2 g 100 mL/hr over 30 Minutes Intravenous  Once 10/07/13 1711 10/07/13 1725       Patient was given sequential compression devices, early ambulation, and chemoprophylaxis to prevent DVT.  Patient benefited maximally from hospital stay and there were no complications.    Recent vital signs: Patient Vitals for the past 24 hrs:  BP Temp Pulse Resp SpO2  10/10/13 0535 125/44 mmHg 98.1 F (36.7 C) 61 16 89 %  10/09/13 2159 123/78 mmHg 98.6 F (37 C) 55 16 90 %  10/09/13 1422 110/44 mmHg 98.1 F (36.7 C) 53 18 98 %     Recent laboratory studies:  Recent Labs  10/07/13 1300 10/08/13 0525 10/09/13 0810 10/10/13 0527  WBC 7.5 7.0 5.3 5.8  HGB 10.9* 9.5* 8.5* 8.2*  HCT 32.3* 28.2* 25.2* 24.0*  PLT 143* 141* 127* 158  NA 138 135* 131*  --   K 4.7 4.2 3.9  --    CL 100 96 95*  --   CO2 25 25 24   --   BUN 19 17 14   --   CREATININE 0.99 0.97 0.92  --   GLUCOSE 86 111* 93  --   INR 1.11  --   --   --   CALCIUM 9.2 8.5 8.6  --      Discharge Medications:     Medication List    STOP taking these medications       aspirin 81 MG tablet  Replaced by:  aspirin 325 MG EC tablet     meloxicam 7.5 MG tablet  Commonly known as:  MOBIC      TAKE these medications       aspirin 325 MG EC tablet  Take 1 tablet (325 mg total) by mouth daily with breakfast.     atenolol 50 MG tablet  Commonly known as:  TENORMIN  Take 50 mg by mouth daily.     citalopram 20 MG tablet  Commonly known as:  CELEXA  Take 1 tablet (20 mg total) by mouth daily.     Colesevelam HCl 3.75 G Pack  Commonly known as:  WELCHOL  Take 1 packet by mouth daily.     DSS 100 MG Caps  Take 100 mg by mouth 2 (two) times daily.     esomeprazole 40 MG capsule  Commonly known as:  NEXIUM  Take 1 capsule (40 mg total) by mouth daily at 12 noon.     HYDROcodone-acetaminophen 5-325 MG per tablet  Commonly known as:  NORCO/VICODIN  Take 1-2 tablets by mouth every 6 (six) hours as needed for moderate pain.     lisinopril-hydrochlorothiazide 20-12.5 MG per tablet  Commonly known as:  PRINZIDE,ZESTORETIC  Take 1 tablet by mouth daily.     methocarbamol 500 MG tablet  Commonly known as:  ROBAXIN  Take 500 mg by mouth every 8 (eight) hours as needed for muscle spasms.     multivitamin with minerals Tabs tablet  Take 1 tablet by mouth daily.     niacin 500 MG CR tablet  Commonly known as:  NIASPAN  Take 1 tablet (500 mg total) by mouth at bedtime.     Vitamin D (Ergocalciferol) 50000 UNITS Caps capsule  Commonly known as:  DRISDOL  Take 1 capsule (50,000 Units total) by mouth every 7 (seven) days.     VITAMIN E PO  Take 1 tablet by mouth daily.        Diagnostic Studies: Dg Hip Operative Right  10/07/2013   CLINICAL DATA:  Status post ORIF for right hip fracture.   EXAM: DG OPERATIVE right HIP 1-2 VIEWS  TECHNIQUE: Fluoroscopic spot image(s) were submitted for interpretation post-operatively.  COMPARISON:  None.  FINDINGS: Three screws and been placed through the femoral through the intertrochanteric region into the femoral head for fixation of a subcapital fracture. There is no immediate postprocedure complication.  IMPRESSION: Status post ORIF for subcapital fracture without evidence of immediate postprocedure complication.   Electronically Signed   By: David  [place]   On: 10/07/2013 18:51   Dg Pelvis Portable  10/07/2013   CLINICAL DATA:  Postoperative study from ORIF of a post traumatic subcapital fracture of the right hip.  EXAM: PORTABLE PELVIS 1-2 VIEWS  COMPARISON:  None.  FINDINGS: Three cortical screws have been placed through the intertrochanteric region into the femoral head on the right. There is no immediate postprocedure complication.  IMPRESSION: Status post ORIF for a subcapital fracture of the right hip without evidence of acute post procedure complication.   Electronically Signed   By: David  [place]   On: 10/07/2013 18:54   Dg Chest Port 1 View  10/07/2013   CLINICAL DATA:  Preoperative cardiovascular examination. High risk surgery.  EXAM: PORTABLE CHEST - 1 VIEW  COMPARISON:  Chest radiograph 05/04/2013  FINDINGS: Stable cardiac and mediastinal contours. Tortuosity of the thoracic aorta. No large consolidative pulmonary opacities. Re- demonstrated biapical pleural parenchymal thickening and associated calcification, left greater than right. No large pleural effusion or pneumothorax.  IMPRESSION: No acute cardiopulmonary process.  Re- demonstrated left-greater-than-right pleural parenchymal thickening and associated calcification.   Electronically Signed   By: Annia Beltrew  Davis M.D.   On: 10/07/2013 13:15    Disposition: 03-Skilled Nursing Facility      Discharge Instructions   Partial weight bearing    Complete by:  As directed   50% weight  bearing right leg  Laterality:  right  Extremity:  Lower           Follow-up Information   Follow up with Kathryne HitchBLACKMAN,CHRISTOPHER Y, MD. Schedule an appointment as soon as possible for a visit in 2 weeks.   Specialty:  Orthopedic Surgery   Contact information:   [address] [place] [address] [phone]        Signed: Richardean CanalCLARK, Add [place] 10/10/2013, 8:36 AM

## 2013-10-10 NOTE — Discharge Instructions (Signed)
Right leg 50 % weight baring Keep dressing over right hip incision clean dry and intact may shower with dressing intact

## 2013-10-11 ENCOUNTER — Non-Acute Institutional Stay (SKILLED_NURSING_FACILITY): Payer: Medicare Other | Admitting: Adult Health

## 2013-10-11 ENCOUNTER — Encounter: Payer: Self-pay | Admitting: Adult Health

## 2013-10-11 DIAGNOSIS — I10 Essential (primary) hypertension: Secondary | ICD-10-CM | POA: Diagnosis not present

## 2013-10-11 DIAGNOSIS — D62 Acute posthemorrhagic anemia: Secondary | ICD-10-CM | POA: Diagnosis not present

## 2013-10-11 DIAGNOSIS — S72001D Fracture of unspecified part of neck of right femur, subsequent encounter for closed fracture with routine healing: Secondary | ICD-10-CM

## 2013-10-11 DIAGNOSIS — E785 Hyperlipidemia, unspecified: Secondary | ICD-10-CM

## 2013-10-11 DIAGNOSIS — F329 Major depressive disorder, single episode, unspecified: Secondary | ICD-10-CM

## 2013-10-11 DIAGNOSIS — F32A Depression, unspecified: Secondary | ICD-10-CM

## 2013-10-11 NOTE — Progress Notes (Signed)
Patient ID: Tracey Rosario, female   DOB: 1926-11-30, 80 y.o.   MRN: [id number]   10/11/2013  Facility:  Nursing Home Location:  [facility] Room Number: 103-P LEVEL OF CARE:  SNF (31)   Chief Complaint  Patient presents with  . New Admit To SNF    HISTORY OF PRESENT ILLNESS:  This is an 80 year old female who has [facility]13 from [facility] with Fracture of right hip S/P cannulated screws. She has been admitted for a short-term rehabilitation.  REASSESSMENT OF ONGOING PROBLEMS:  HTN: Pt 's HTN remains stable.  Denies CP, sob, DOE, pedal edema, headaches, dizziness or visual disturbances.  No complications from the medications currently being used.  Last BP : 120/50  DEPRESSION: The depression remains stable. Patient denies ongoing feelings of sadness, insomnia, anedhonia or lack of appetite. No complications reported from the medications currently being used. Staff do not report behavioral problems.  HYPERLIPIDEMIA: No complications from the medications presently being used. 7/15 fasting lipid panel showed : Cholesterol 222 triglycerides 139 HDL 57.00 LDL 137   PAST MEDICAL HISTORY:  Past Medical History  Diagnosis Date  . Hypertension   . A-fib     CURRENT MEDICATIONS: Reviewed per MAR/see medication list  Allergies  Allergen Reactions  . Codeine Other (See Comments)    Makes her crazy     REVIEW OF SYSTEMS:  GENERAL: no change in appetite, no fatigue, no weight changes, no fever, chills or weakness RESPIRATORY: no cough, SOB, DOE, wheezing, hemoptysis CARDIAC: no chest pain, edema or palpitations GI: no abdominal pain, diarrhea, constipation, heart burn, nausea or vomiting  PHYSICAL EXAMINATION  GENERAL: no acute distress, normal body habitus EYES: conjunctivae normal, sclerae normal, normal eye lids NECK: supple, trachea midline, no neck masses, no thyroid tenderness, no  thyromegaly LYMPHATICS: no LAN in the neck, no supraclavicular LAN RESPIRATORY: breathing is even & unlabored, BS CTAB CARDIAC: RRR, no murmur,no extra heart sounds, no edema GI: abdomen soft, normal BS, no masses, no tenderness, no hepatomegaly, no splenomegaly EXTREMITIES:  Able to move all 4 extremities PSYCHIATRIC: the patient is alert & oriented to person, affect & behavior appropriate  LABS/RADIOLOGY: Labs reviewed: Basic Metabolic Panel:  Recent Labs  02/72/5308/05/17 1300 10/08/13 0525 10/09/13 0810  NA 138 135* 131*  K 4.7 4.2 3.9  CL 100 96 95*  CO2 25 25 24   GLUCOSE 86 111* 93  BUN 19 17 14   CREATININE 0.99 0.97 0.92  CALCIUM 9.2 8.5 8.6   Liver Function Tests:  Recent Labs  07/29/13 1110  AST 19  ALT 11  ALKPHOS 92  BILITOT 0.5  PROT 7.1  ALBUMIN 4.1   CBC:  Recent Labs  05/04/13 1850 07/29/13 1110 10/07/13 1300 10/08/13 0525 10/09/13 0810 10/10/13 0527  WBC 9.1 4.4 7.5 7.0 5.3 5.8  NEUTROABS 7.4 2.5 5.4  --   --   --   HGB 11.0* 10.9* 10.9* 9.5* 8.5* 8.2*  HCT 32.3* 32.0* 32.3* 28.2* 25.2* 24.0*  MCV 88.0 87.2 85.4 86.2 85.4 85.7  PLT 140* 225.0 143* 141* 127* 158   Lipid Panel:  Recent Labs  07/29/13 1110  HDL 57.00   Dg Hip Operative Right  10/07/2013   CLINICAL DATA:  Status post ORIF for right hip fracture.  EXAM: DG OPERATIVE right HIP 1-2 VIEWS  TECHNIQUE: Fluoroscopic spot image(s) were submitted for interpretation post-operatively.  COMPARISON:  None.  FINDINGS: Three screws and been placed  through the femoral through the intertrochanteric region into the femoral head for fixation of a subcapital fracture. There is no immediate postprocedure complication.  IMPRESSION: Status post ORIF for subcapital fracture without evidence of immediate postprocedure complication.   Electronically Signed   By: David  [place]   On: 10/07/2013 18:51   Dg Pelvis Portable  10/07/2013   CLINICAL DATA:  Postoperative study from ORIF of a post traumatic  subcapital fracture of the right hip.  EXAM: PORTABLE PELVIS 1-2 VIEWS  COMPARISON:  None.  FINDINGS: Three cortical screws have been placed through the intertrochanteric region into the femoral head on the right. There is no immediate postprocedure complication.  IMPRESSION: Status post ORIF for a subcapital fracture of the right hip without evidence of acute post procedure complication.   Electronically Signed   By: David  [place]   On: 10/07/2013 18:54   Dg Chest Port 1 View  10/07/2013   CLINICAL DATA:  Preoperative cardiovascular examination. High risk surgery.  EXAM: PORTABLE CHEST - 1 VIEW  COMPARISON:  Chest radiograph 05/04/2013  FINDINGS: Stable cardiac and mediastinal contours. Tortuosity of the thoracic aorta. No large consolidative pulmonary opacities. Re- demonstrated biapical pleural parenchymal thickening and associated calcification, left greater than right. No large pleural effusion or pneumothorax.  IMPRESSION: No acute cardiopulmonary process.  Re- demonstrated left-greater-than-right pleural parenchymal thickening and associated calcification.   Electronically Signed   By: Annia Beltrew  Davis M.D.   On: 10/07/2013 13:15    ASSESSMENT/PLAN:  Right hip fracture status post cannulated screws - for rehabilitation Hypertension - well controlled; continue Prinzide and atenolol; BP/heart rate every shift x1 week Anemia, acute blood loss -  Check CBC and BMP Depression - stable; continue Celexa Hyperlipidemia - continue WelChol   CPT CODE: [id number]    [facility] HospitalMEDINA-VARGAS,Tracey Villers, NP [facility] [phone]

## 2013-10-11 NOTE — Care Management Note (Signed)
CARE MANAGEMENT NOTE 10/11/2013  Patient:  Tracey Rosario,Tracey Rosario   Account Number:  [id number]  Date Initiated:  10/10/2013  Documentation initiated by:  Vance PeperBRADY,Shawnetta Lein  Subjective/Objective Assessment:   80 yr old female admitted with right hip femoral neck fracture, under went  right hip ORIF.     Action/Plan:   Patient is for shortterm rehab at SNF, [facility]   Anticipated DC Date:  10/10/2013   Anticipated DC Plan:  SKILLED NURSING FACILITY  In-house referral  Clinical Social Worker      DC Planning Services  CM consult      [facility]  NA   Choice offered to / List presented to:     DME arranged  NA        HH arranged  NA      Status of service:  Completed, signed off Medicare Important Message given?   (If response is "NO", the following Medicare IM given date fields will be blank) Date Medicare IM given:   Medicare IM given by:   Date Additional Medicare IM given:   Additional Medicare IM given by:    Discharge Disposition:  SKILLED NURSING FACILITY  Per UR Regulation:  Reviewed for med. necessity/level of care/duration of stay

## 2013-10-11 NOTE — Progress Notes (Signed)
Agree with PTA.    Mikaelah Trostle, PT [phone]  

## 2013-10-14 ENCOUNTER — Encounter (HOSPITAL_COMMUNITY): Payer: Self-pay | Admitting: Orthopaedic Surgery

## 2013-10-15 ENCOUNTER — Non-Acute Institutional Stay (SKILLED_NURSING_FACILITY): Payer: Medicare Other | Admitting: Internal Medicine

## 2013-10-15 ENCOUNTER — Encounter: Payer: Self-pay | Admitting: Family

## 2013-10-15 DIAGNOSIS — I482 Chronic atrial fibrillation, unspecified: Secondary | ICD-10-CM

## 2013-10-15 DIAGNOSIS — S72141S Displaced intertrochanteric fracture of right femur, sequela: Secondary | ICD-10-CM | POA: Diagnosis not present

## 2013-10-15 DIAGNOSIS — I10 Essential (primary) hypertension: Secondary | ICD-10-CM | POA: Diagnosis not present

## 2013-10-15 DIAGNOSIS — D62 Acute posthemorrhagic anemia: Secondary | ICD-10-CM

## 2013-10-16 DIAGNOSIS — I4891 Unspecified atrial fibrillation: Secondary | ICD-10-CM | POA: Insufficient documentation

## 2013-10-16 DIAGNOSIS — I482 Chronic atrial fibrillation, unspecified: Secondary | ICD-10-CM | POA: Insufficient documentation

## 2013-10-16 NOTE — Progress Notes (Signed)
HISTORY & PHYSICAL  DATE: 10/15/2013   FACILITY: [facility]  LEVEL OF CARE: SNF (31)  ALLERGIES:  Allergies  Allergen Reactions  . Codeine Other (See Comments)    Makes her crazy    CHIEF COMPLAINT:  Manage right hip fracture, atrial fibrillation and hypertension  HISTORY OF PRESENT ILLNESS: Patient is an 80 year old Caucasian female.  HIP FRACTURE: The patient had a mechanical fall and sustained a femur fracture.  Patient subsequently underwent surgical repair and tolerated the procedure well. Patient is admitted to this facility for short-term rehabilitation. Patient denies hip pain currently. No complications reported from the pain medications currently being used.  ATRIAL FIBRILLATION: the patients atrial fibrillation remains stable.  The patient denies DOE, tachycardia, orthopnea, transient neurological sx, pedal edema, palpitations, & PNDs.  No complications noted from the medications currently being used.ATRIAL FIBRILLATION: the patients atrial fibrillation remains stable.  The patient denies DOE, tachycardia, orthopnea, transient neurological sx, pedal edema, palpitations, & PNDs.  No complications noted from the medications currently being used.  HTN: Pt 's HTN remains stable.  Denies CP, sob, DOE, pedal edema, headaches, dizziness or visual disturbances.  No complications from the medications currently being used.  Last BP : 138/44.  PAST MEDICAL HISTORY :  Past Medical History  Diagnosis Date  . Hypertension   . A-fib     PAST SURGICAL HISTORY: Past Surgical History  Procedure Laterality Date  . Abdominal hysterectomy  1971  . Bladder surgery  2010  . Broken pelvis  2015  . Hip pinning,cannulated Right 10/07/2013    Procedure: CANNULATED screws right hip;  Surgeon: Kathryne Hitch, MD;  Location: [facility] OR;  Service: Orthopedics;  Laterality: Right;    SOCIAL HISTORY:  reports that she quit smoking about 43 years ago. She does  not have any smokeless tobacco history on file. She reports that she does not drink alcohol or use illicit drugs.  FAMILY HISTORY:  Family History  Problem Relation Age of Onset  . Prostate cancer Father   . Cancer Brother   . Cancer Sister   . Heart attack Sister   . Cancer Sister     CURRENT MEDICATIONS: Reviewed per MAR/see medication list  REVIEW OF SYSTEMS:  See HPI otherwise 14 point ROS is negative.  PHYSICAL EXAMINATION  VS:  See VS section  GENERAL: no acute distress, normal body habitus EYES: conjunctivae normal, sclerae normal, normal eye lids MOUTH/THROAT: lips without lesions,no lesions in the mouth,tongue is without lesions,uvula elevates in midline NECK: supple, trachea midline, no neck masses, no thyroid tenderness, no thyromegaly LYMPHATICS: no LAN in the neck, no supraclavicular LAN RESPIRATORY: breathing is even & unlabored, BS CTAB CARDIAC: Heart rate is irregularly irregular, no murmur,no extra heart sounds, no edema GI:  ABDOMEN: abdomen soft, normal BS, no masses, no tenderness  LIVER/SPLEEN: no hepatomegaly, no splenomegaly MUSCULOSKELETAL: HEAD: normal to inspection  EXTREMITIES: LEFT UPPER EXTREMITY: full range of motion, normal strength & tone RIGHT UPPER EXTREMITY:  full range of motion, normal strength & tone LEFT LOWER EXTREMITY:  Moderate range of motion, normal strength & tone RIGHT LOWER EXTREMITY:  range of motion not tested due to surgery, normal strength & tone PSYCHIATRIC: the patient is alert & oriented to person, affect & behavior appropriate  LABS/RADIOLOGY:  Labs reviewed: Basic Metabolic Panel:  Recent Labs  16/10/96 1300 10/08/13 0525 10/09/13 0810  NA 138 135* 131*  K 4.7 4.2 3.9  CL 100 96  95*  CO2 25 25 24   GLUCOSE 86 111* 93  BUN 19 17 14   CREATININE 0.99 0.97 0.92  CALCIUM 9.2 8.5 8.6   Liver Function Tests:  Recent Labs  07/29/13 1110  AST 19  ALT 11  ALKPHOS 92  BILITOT 0.5  PROT 7.1  ALBUMIN 4.1    CBC:  Recent Labs  05/04/13 1850 07/29/13 1110 10/07/13 1300 10/08/13 0525 10/09/13 0810 10/10/13 0527  WBC 9.1 4.4 7.5 7.0 5.3 5.8  NEUTROABS 7.4 2.5 5.4  --   --   --   HGB 11.0* 10.9* 10.9* 9.5* 8.5* 8.2*  HCT 32.3* 32.0* 32.3* 28.2* 25.2* 24.0*  MCV 88.0 87.2 85.4 86.2 85.4 85.7  PLT 140* 225.0 143* 141* 127* 158   Lipid Panel:  Recent Labs  07/29/13 1110  HDL 57.00    Transthoracic Echocardiography  Patient:    Sakiya, Stepka MR #:       [id number] Study Date: 08/16/2013 Gender:     F Age:        72 Height:     177.8 cm Weight:     68 kg BSA:        1.83 m^2 Pt. Status: Room:   SONOGRAPHER  Aida Raider, RDCS  ATTENDING    Briscoe Deutscher, Oran Rein B  REFERRING    Adline Mango B  PERFORMING   [facility], Outpatient  cc:  ------------------------------------------------------------------- LV EF: 60% -   65%  ------------------------------------------------------------------- Indications:      Abnormal EKG 794.31.  ------------------------------------------------------------------- History:   PMH:  Acquired from the patient and from the patient&'s chart.  PMH:  Atrial Fibrillation.  Risk factors:  Hypertension.   ------------------------------------------------------------------- Study Conclusions  - Left ventricle: The cavity size was normal. Wall thickness was   normal. Systolic function was normal. The estimated ejection   fraction was in the range of 60% to 65%. - Aortic valve: There was mild regurgitation. - Left atrium: The atrium was mildly dilated. - Right atrium: The atrium was mildly dilated. - Tricuspid valve: There was mild-moderate regurgitation.  Transthoracic echocardiography.  M-mode, complete 2D, spectral Doppler, and color Doppler.  Birthdate:  Patient birthdate: 02/21/1926.  Age:  Patient is 80 yr old.  Sex:  Gender: female. BMI: 21.5 kg/m^2.  Blood pressure:     144/62  Patient  status: Outpatient.  Study date:  Study date: 08/16/2013. Study time: 02:16 PM.  Location:  [facility]  -------------------------------------------------------------------  ------------------------------------------------------------------- Left ventricle:  The cavity size was normal. Wall thickness was normal. Systolic function was normal. The estimated ejection fraction was in the range of 60% to 65%.  ------------------------------------------------------------------- Aortic valve:   Mildly thickened leaflets.  Doppler:  There was mild regurgitation.  ------------------------------------------------------------------- Mitral valve:   Mildly thickened leaflets .  Doppler:  There was trivial regurgitation.    Peak gradient (D): 7 mm Hg.  ------------------------------------------------------------------- Left atrium:  The atrium was mildly dilated.  ------------------------------------------------------------------- Right ventricle:  The cavity size was normal. Wall thickness was normal. Systolic function was normal.  ------------------------------------------------------------------- Tricuspid valve:   Doppler:  There was mild-moderate regurgitation.   ------------------------------------------------------------------- Right atrium:  The atrium was mildly dilated.  ------------------------------------------------------------------- Pericardium:  A trivial pericardial effusion was identified.  ------------------------------------------------------------------- Measurements   Left ventricle                              Value  Reference  LV ID, ED, PLAX chordal                     44    mm     43 - 52  LV ID, ES, PLAX chordal                     26    mm     23 - 38  LV fx shortening, PLAX chordal              41    %      >=29  LV PW thickness, ED                         11    mm     ---------  IVS/LV PW ratio, ED                         1            <=1.3   Stroke volume, 2D                           107   ml     ---------  Stroke volume/bsa, 2D                       58    ml/m^2 ---------  LV e&', lateral                              9.02  cm/s   ---------  LV E/e&', lateral                            15.08        ---------  LV e&', medial                               8.67  cm/s   ---------  LV E/e&', medial                             15.69        ---------  LV e&', average                              8.85  cm/s   ---------  LV E/e&', average                            15.38        ---------    Ventricular septum                          Value        Reference  IVS thickness, ED                           11    mm     ---------    LVOT  Value        Reference  LVOT ID, S                                  19    mm     ---------  LVOT area                                   2.84  cm^2   ---------  LVOT VTI, S                                 37.5  cm     ---------    Aorta                                       Value        Reference  Aortic root ID, ED                          30    mm     ---------    Left atrium                                 Value        Reference  LA ID, A-P, ES                              44    mm     ---------  LA ID/bsa, A-P                      (H)     2.4   cm/m^2 <=2.2    Mitral valve                                Value        Reference  Mitral E-wave peak velocity                 136   cm/s   ---------  Mitral A-wave peak velocity                 99.2  cm/s   ---------  Mitral deceleration time                    197   ms     150 - 230  Mitral peak gradient, D                     7     mm Hg  ---------  Mitral E/A ratio, peak                      1.4          ---------    Tricuspid valve                             Value  Reference  Tricuspid regurg peak velocity              309   cm/s   ---------  Tricuspid peak RV-RA gradient               38    mm Hg   ---------  Tricuspid maximal regurg velocity,          309   cm/s   ---------  PISA    Right ventricle                             Value        Reference  RV s&', lateral, S                           16.4  cm/s   ---------  PORTABLE CHEST - 1 VIEW   COMPARISON:  Chest radiograph 05/04/2013   FINDINGS: Stable cardiac and mediastinal contours. Tortuosity of the thoracic aorta. No large consolidative pulmonary opacities. Re- demonstrated biapical pleural parenchymal thickening and associated calcification, left greater than right. No large pleural effusion or pneumothorax.   IMPRESSION: No acute cardiopulmonary process.   Re- demonstrated left-greater-than-right pleural parenchymal thickening and associated calcification.   DG OPERATIVE right HIP 1-2 VIEWS   TECHNIQUE: Fluoroscopic spot image(s) were submitted for interpretation post-operatively.   COMPARISON:  None.   FINDINGS: Three screws and been placed through the femoral through the intertrochanteric region into the femoral head for fixation of a subcapital fracture. There is no immediate postprocedure complication.   IMPRESSION: Status post ORIF for subcapital fracture without evidence of immediate postprocedure complication.   PORTABLE PELVIS 1-2 VIEWS   COMPARISON:  None.   FINDINGS: Three cortical screws have been placed through the intertrochanteric region into the femoral head on the right. There is no immediate postprocedure complication.   IMPRESSION: Status post ORIF for a subcapital fracture of the right hip without evidence of acute post procedure complication.    ASSESSMENT/PLAN:  Right hip fracture-status post repair. Continue rehabilitation.  Atrial fibrillation-rate controlled Hypertension-well controlled Acute blood loss anemia-check hemoglobin GERD-continue Nexium Hyperlipidemia -continue niacin and welchol Check CBC  I have reviewed patient's medical records received at  admission/from hospitalization.  CPT CODE: [id number]  Angela CoxGayani Y Nature Kueker, MD [facility] [phone]

## 2013-10-21 DIAGNOSIS — Z967 Presence of other bone and tendon implants: Secondary | ICD-10-CM | POA: Diagnosis not present

## 2013-10-24 ENCOUNTER — Encounter: Payer: Self-pay | Admitting: Adult Health

## 2013-10-24 ENCOUNTER — Non-Acute Institutional Stay (SKILLED_NURSING_FACILITY): Payer: Medicare Other | Admitting: Adult Health

## 2013-10-24 DIAGNOSIS — D62 Acute posthemorrhagic anemia: Secondary | ICD-10-CM | POA: Diagnosis not present

## 2013-10-24 DIAGNOSIS — K219 Gastro-esophageal reflux disease without esophagitis: Secondary | ICD-10-CM

## 2013-10-24 DIAGNOSIS — I10 Essential (primary) hypertension: Secondary | ICD-10-CM | POA: Diagnosis not present

## 2013-10-24 DIAGNOSIS — S72141S Displaced intertrochanteric fracture of right femur, sequela: Secondary | ICD-10-CM

## 2013-10-24 DIAGNOSIS — E785 Hyperlipidemia, unspecified: Secondary | ICD-10-CM | POA: Diagnosis not present

## 2013-10-24 DIAGNOSIS — F32A Depression, unspecified: Secondary | ICD-10-CM

## 2013-10-24 DIAGNOSIS — F329 Major depressive disorder, single episode, unspecified: Secondary | ICD-10-CM

## 2013-10-24 NOTE — Progress Notes (Signed)
Patient ID: Tracey FillersNancy Basara, female   DOB: [birth year], 80 y.o.   MRN: [id number]   10/24/2013  Facility:  Nursing Home [facility] Room Number: 103-P LEVEL OF CARE:  SNF (31)   Chief Complaint  Patient presents with  . Discharge Note    HISTORY OF PRESENT ILLNESS:  This is an 80 year old female who is for discharge home with Home health PT and OT. [facility] with Fracture of right hip S/P cannulated screws. Patient was admitted to this facility for short-term rehabilitation after the patient's recent hospitalization.  Patient has completed SNF rehabilitation and therapy has cleared the patient for discharge.    REASSESSMENT OF ONGOING PROBLEMS:  HTN: Pt 's HTN remains stable.  Denies CP, sob, DOE, pedal edema, headaches, dizziness or visual disturbances.  No complications from the medications currently being used.  Last BP : 128/54  ANEMIA: The anemia has been stable. The patient denies fatigue, melena or hematochezia. No complications from the medications currently being used. 10/15 hgb 9.1  GERD: pt's GERD is stable.  Denies ongoing heartburn, abd. Pain, nausea or vomiting.  Currently on a PPI & tolerates it without any adverse reactions.  PAST MEDICAL HISTORY:  Past Medical History  Diagnosis Date  . Hypertension   . A-fib     CURRENT MEDICATIONS: Reviewed per MAR/see medication list  Allergies  Allergen Reactions  . Codeine Other (See Comments)    Makes her crazy     REVIEW OF SYSTEMS:  GENERAL: no change in appetite, no fatigue, no weight changes, no fever, chills or weakness RESPIRATORY: no cough, SOB, DOE, wheezing, hemoptysis CARDIAC: no chest pain, edema or palpitations GI: no abdominal pain, diarrhea, constipation, heart burn, nausea or vomiting  PHYSICAL EXAMINATION  GENERAL: no acute distress, normal body habitus NECK: supple, trachea midline, no neck  masses, no thyroid tenderness, no thyromegaly LYMPHATICS: no LAN in the neck, no supraclavicular LAN RESPIRATORY: breathing is even & unlabored, BS CTAB CARDIAC: RRR, no murmur,no extra heart sounds, no edema GI: abdomen soft, normal BS, no masses, no tenderness, no hepatomegaly, no splenomegaly EXTREMITIES:  Able to move all 4 extremities; walks with walker PSYCHIATRIC: the patient is alert & oriented to person, affect & behavior appropriate  LABS/RADIOLOGY: 10/15/13  WBC 4.9 hemoglobin 9.1 hematocrit 29.1 MCV 93.3 10/14/13  WBC 5.5 hemoglobin 8.8 hematocrit 28.5 MCV 91.9 10/11/13  sodium 137 potassium 4.4 glucose 94 BUN 18 creatinine 0.8 calcium 9.5 Labs reviewed: Basic Metabolic Panel:  Recent Labs  84/16/6008/05/17 1300 10/08/13 0525 10/09/13 0810  NA 138 135* 131*  K 4.7 4.2 3.9  CL 100 96 95*  CO2 25 25 24   GLUCOSE 86 111* 93  BUN 19 17 14   CREATININE 0.99 0.97 0.92  CALCIUM 9.2 8.5 8.6   Liver Function Tests:  Recent Labs  07/29/13 1110  AST 19  ALT 11  ALKPHOS 92  BILITOT 0.5  PROT 7.1  ALBUMIN 4.1   CBC:  Recent Labs  05/04/13 1850 07/29/13 1110 10/07/13 1300 10/08/13 0525 10/09/13 0810 10/10/13 0527  WBC 9.1 4.4 7.5 7.0 5.3 5.8  NEUTROABS 7.4 2.5 5.4  --   --   --   HGB 11.0* 10.9* 10.9* 9.5* 8.5* 8.2*  HCT 32.3* 32.0* 32.3* 28.2* 25.2* 24.0*  MCV 88.0 87.2 85.4 86.2 85.4 85.7  PLT 140* 225.0 143* 141* 127* 158   Lipid Panel:  Recent Labs  07/29/13 1110  HDL 57.00   Dg Hip Operative Right  10/07/2013   CLINICAL DATA:  Status post ORIF for right hip fracture.  EXAM: DG OPERATIVE right HIP 1-2 VIEWS  TECHNIQUE: Fluoroscopic spot image(s) were submitted for interpretation post-operatively.  COMPARISON:  None.  FINDINGS: Three screws and been placed through the femoral through the intertrochanteric region into the femoral head for fixation of a subcapital fracture. There is no immediate postprocedure complication.  IMPRESSION: Status post ORIF for  subcapital fracture without evidence of immediate postprocedure complication.   Electronically Signed   By: David  [place]   On: 10/07/2013 18:51   Dg Pelvis Portable  10/07/2013   CLINICAL DATA:  Postoperative study from ORIF of a post traumatic subcapital fracture of the right hip.  EXAM: PORTABLE PELVIS 1-2 VIEWS  COMPARISON:  None.  FINDINGS: Three cortical screws have been placed through the intertrochanteric region into the femoral head on the right. There is no immediate postprocedure complication.  IMPRESSION: Status post ORIF for a subcapital fracture of the right hip without evidence of acute post procedure complication.   Electronically Signed   By: David  [place]   On: 10/07/2013 18:54   Dg Chest Port 1 View  10/07/2013   CLINICAL DATA:  Preoperative cardiovascular examination. High risk surgery.  EXAM: PORTABLE CHEST - 1 VIEW  COMPARISON:  Chest radiograph 05/04/2013  FINDINGS: Stable cardiac and mediastinal contours. Tortuosity of the thoracic aorta. No large consolidative pulmonary opacities. Re- demonstrated biapical pleural parenchymal thickening and associated calcification, left greater than right. No large pleural effusion or pneumothorax.  IMPRESSION: No acute cardiopulmonary process.  Re- demonstrated left-greater-than-right pleural parenchymal thickening and associated calcification.   Electronically Signed   By: Annia Beltrew  Davis M.D.   On: 10/07/2013 13:15    ASSESSMENT/PLAN:  Right hip fracture status post cannulated screws - for home health PT and OT Hypertension - well controlled; continue Prinzide and atenolol Anemia, acute blood loss -  stable Depression - stable; continue Celexa Hyperlipidemia - continue WelChol and Niacin GERD - stable; continue Nexium   I have filled out patient's discharge paperwork and written prescriptions.  Patient will receive home health PT and OT.   Total discharge time: Less than 30 minutes  Discharge time involved coordination of the  discharge process with Child psychotherapistsocial worker, nursing staff and therapy department. Medical justification for home health services verified.  CPT CODE: [id number]    [facility],[place], NP [facility] [phone]

## 2013-10-26 DIAGNOSIS — I482 Chronic atrial fibrillation: Secondary | ICD-10-CM | POA: Diagnosis not present

## 2013-10-26 DIAGNOSIS — S7291XD Unspecified fracture of right femur, subsequent encounter for closed fracture with routine healing: Secondary | ICD-10-CM | POA: Diagnosis not present

## 2013-10-26 DIAGNOSIS — Z4889 Encounter for other specified surgical aftercare: Secondary | ICD-10-CM | POA: Diagnosis not present

## 2013-10-26 DIAGNOSIS — I10 Essential (primary) hypertension: Secondary | ICD-10-CM | POA: Diagnosis not present

## 2013-10-26 DIAGNOSIS — Z9181 History of falling: Secondary | ICD-10-CM | POA: Diagnosis not present

## 2013-10-26 DIAGNOSIS — D649 Anemia, unspecified: Secondary | ICD-10-CM | POA: Diagnosis not present

## 2013-11-18 DIAGNOSIS — M25551 Pain in right hip: Secondary | ICD-10-CM | POA: Diagnosis not present

## 2013-11-18 DIAGNOSIS — M25512 Pain in left shoulder: Secondary | ICD-10-CM | POA: Diagnosis not present

## 2013-12-11 ENCOUNTER — Other Ambulatory Visit: Payer: Self-pay | Admitting: Family

## 2013-12-20 ENCOUNTER — Other Ambulatory Visit: Payer: Self-pay | Admitting: Family

## 2013-12-26 ENCOUNTER — Other Ambulatory Visit: Payer: Self-pay | Admitting: Family

## 2014-02-14 ENCOUNTER — Other Ambulatory Visit: Payer: Self-pay | Admitting: Family

## 2014-03-02 ENCOUNTER — Other Ambulatory Visit: Payer: Self-pay | Admitting: Family

## 2014-03-04 DIAGNOSIS — Z85820 Personal history of malignant melanoma of skin: Secondary | ICD-10-CM | POA: Diagnosis not present

## 2014-03-04 DIAGNOSIS — Z85828 Personal history of other malignant neoplasm of skin: Secondary | ICD-10-CM | POA: Diagnosis not present

## 2014-03-04 DIAGNOSIS — L821 Other seborrheic keratosis: Secondary | ICD-10-CM | POA: Diagnosis not present

## 2014-03-04 DIAGNOSIS — Z08 Encounter for follow-up examination after completed treatment for malignant neoplasm: Secondary | ICD-10-CM | POA: Diagnosis not present

## 2014-03-06 ENCOUNTER — Other Ambulatory Visit: Payer: Self-pay | Admitting: Family

## 2014-03-27 DIAGNOSIS — H02833 Dermatochalasis of right eye, unspecified eyelid: Secondary | ICD-10-CM | POA: Diagnosis not present

## 2014-03-27 DIAGNOSIS — H35373 Puckering of macula, bilateral: Secondary | ICD-10-CM | POA: Diagnosis not present

## 2014-03-27 DIAGNOSIS — H35342 Macular cyst, hole, or pseudohole, left eye: Secondary | ICD-10-CM | POA: Diagnosis not present

## 2014-03-27 DIAGNOSIS — H524 Presbyopia: Secondary | ICD-10-CM | POA: Diagnosis not present

## 2014-03-27 DIAGNOSIS — Z961 Presence of intraocular lens: Secondary | ICD-10-CM | POA: Diagnosis not present

## 2014-04-28 DIAGNOSIS — H11433 Conjunctival hyperemia, bilateral: Secondary | ICD-10-CM | POA: Diagnosis not present

## 2014-04-28 DIAGNOSIS — H02403 Unspecified ptosis of bilateral eyelids: Secondary | ICD-10-CM | POA: Diagnosis not present

## 2014-04-28 DIAGNOSIS — H53483 Generalized contraction of visual field, bilateral: Secondary | ICD-10-CM | POA: Diagnosis not present

## 2014-05-07 ENCOUNTER — Other Ambulatory Visit: Payer: Self-pay | Admitting: Family

## 2014-05-26 DIAGNOSIS — H02403 Unspecified ptosis of bilateral eyelids: Secondary | ICD-10-CM | POA: Diagnosis not present

## 2014-05-26 DIAGNOSIS — H02034 Senile entropion of left upper eyelid: Secondary | ICD-10-CM | POA: Diagnosis not present

## 2014-05-26 DIAGNOSIS — H02004 Unspecified entropion of left upper eyelid: Secondary | ICD-10-CM | POA: Diagnosis not present

## 2014-05-26 DIAGNOSIS — H02001 Unspecified entropion of right upper eyelid: Secondary | ICD-10-CM | POA: Diagnosis not present

## 2014-05-26 DIAGNOSIS — H02031 Senile entropion of right upper eyelid: Secondary | ICD-10-CM | POA: Diagnosis not present

## 2014-06-26 ENCOUNTER — Other Ambulatory Visit: Payer: Self-pay | Admitting: Family

## 2014-07-29 ENCOUNTER — Other Ambulatory Visit: Payer: Self-pay | Admitting: Family

## 2014-08-05 ENCOUNTER — Ambulatory Visit (INDEPENDENT_AMBULATORY_CARE_PROVIDER_SITE_OTHER): Payer: Medicare Other | Admitting: Family Medicine

## 2014-08-05 ENCOUNTER — Encounter: Payer: Self-pay | Admitting: Family Medicine

## 2014-08-05 VITALS — BP 112/70 | HR 69 | Temp 97.1°F | Ht 68.0 in | Wt 165.3 lb

## 2014-08-05 DIAGNOSIS — Z23 Encounter for immunization: Secondary | ICD-10-CM | POA: Diagnosis not present

## 2014-08-05 DIAGNOSIS — I10 Essential (primary) hypertension: Secondary | ICD-10-CM

## 2014-08-05 DIAGNOSIS — Z0000 Encounter for general adult medical examination without abnormal findings: Secondary | ICD-10-CM

## 2014-08-05 DIAGNOSIS — F329 Major depressive disorder, single episode, unspecified: Secondary | ICD-10-CM | POA: Diagnosis not present

## 2014-08-05 DIAGNOSIS — E785 Hyperlipidemia, unspecified: Secondary | ICD-10-CM

## 2014-08-05 DIAGNOSIS — K219 Gastro-esophageal reflux disease without esophagitis: Secondary | ICD-10-CM | POA: Diagnosis not present

## 2014-08-05 DIAGNOSIS — R413 Other amnesia: Secondary | ICD-10-CM

## 2014-08-05 DIAGNOSIS — F32A Depression, unspecified: Secondary | ICD-10-CM

## 2014-08-05 MED ORDER — ESOMEPRAZOLE MAGNESIUM 20 MG PO CPDR: 20.0000 mg | DELAYED_RELEASE_CAPSULE | Freq: Every day | ORAL | Status: DC

## 2014-08-05 NOTE — Progress Notes (Signed)
Pre visit review using our clinic review tool, if applicable. No additional management support is needed unless otherwise documented below in the visit note. 

## 2014-08-05 NOTE — Addendum Note (Signed)
Addended by: Johnella Moloney on: 08/05/2014 01:56 PM   Modules accepted: Orders

## 2014-08-05 NOTE — Progress Notes (Signed)
Medicare Annual Preventive Care Visit  (initial annual wellness or annual wellness exam)  Tracey Rosario is a pleasant 81 yo F patient whom used to see Oran Rein, here for her annual wellness exam. She has her transfer of care visit already scheduled.  Concerns and/or follow up today:  Depression: -chronic, reports on medication for this for at least 3-4 years -meds: celexa  daily -denies: depression, crying, sleep issues  HTN/Palpitations: -chronic, seen by cardiology in 2015 -meds with prior PCP: lisinopril-hctz 20-12.5, atenolol  daily -denies: CP, SOB, DOE, recent palpitations, dizziness  GERD: -chronic, reports has been on PPI for many years -meds: nexium  daily -denies: hx of hiatal hernia, dysphagia, GI bleed, ulcer or stricture, esophagitis  HLD: -chronic: -meds with prior PCP: welchol 37.5 daily, niaspan  makes her flus - she takes 81 mg of asa -no hx of statin intolerance -she does not like taking welchol due to chost or the niaspan and wants to stop it  Cognitive declined: -she has recently noticed worsening memory issues with short term memory loss -denies: falls, feeling unsafe, weakness, depression now on medication -lives alone, feels safe, daughter lives near her  Hx pelvic and hip fx - mechanical falls both times. Denies any problems with balance and reports normal bone health. Reports had dexa and normal.   ROS: negative for report of fevers, unintentional weight loss, vision changes, vision loss, hearing loss or change, chest pain, sob, hemoptysis, melena, hematochezia, hematuria, genital discharge or lesions, falls, bleeding or bruising, loc, thoughts of suicide or self harm, memory loss  1.) Patient-completed health risk assessment  - completed and reviewed, see scanned documentation  2.) Review of Medical History: -PMH, PSH, Family History and current specialty and care providers reviewed and updated and listed below  - see scanned in  document in chart and below  Past Medical History  Diagnosis Date  . Hypertension   . A-fib   . Fracture of left inferior pubic ramus 05/04/2013  . Fracture of right inferior pubic ramus 05/04/2013  . Fracture of right hip requiring operative repair 10/07/2013    Past Surgical History  Procedure Laterality Date  . Abdominal hysterectomy  1971  . Bladder surgery  2010  . Broken pelvis  2015  . Hip pinning,cannulated Right 10/07/2013    Procedure: CANNULATED screws right hip;  Surgeon: Kathryne Hitch, MD;  Location: [facility];  Service: Orthopedics;  Laterality: Right;    History   Social History  . Marital Status: Widowed    Spouse Name: N/A  . Number of Children: N/A  . Years of Education: N/A   Occupational History  . Not on file.   Social History Main Topics  . Smoking status: Former Smoker    Quit date: 08/02/1970  . Smokeless tobacco: Not on file     Comment: "smoked very little"  . Alcohol Use: No  . Drug Use: No  . Sexual Activity: Not on file   Other Topics Concern  . Not on file   Social History Narrative    The patient has a family history of  3.) Review of functional ability and level of safety:  Any difficulty hearing? YES NO  History of falling? YES NO  Any trouble with IADLs - using a phone, using transportation, grocery shopping, preparing meals, doing housework, doing laundry, taking medications and managing money? YES NO  Advance Directives? YES NO  See summary of recommendations in Patient Instructions below.  4.) Physical Exam Filed Vitals:  08/05/14 1124  BP: 112/70  Pulse: 69  Temp: 97.1 F (36.2 C)   Estimated body mass index is 25.14 kg/(m^2) as calculated from the following:   Height as of this encounter: 5\' 8"  (1.727 m).   Weight as of this encounter: 165 lb 4.8 oz (74.98 kg).  EKG (optional): deferred  General: alert, appear well hydrated and in no acute distress  HEENT: visual acuity grossly intact  CV:  HRRR  Lungs: CTA bilaterally  Psych: pleasant and cooperative, no obvious depression or anxiety  Mini Cog: -0/3 words recalled, clock draw with some difficulty  See patient instructions for recommendations.  Education and counseling regarding the above review of health provided with a plan for the following: -see scanned patient completed form for further details -fall prevention strategies discussed  -healthy lifestyle discussed -importance and resources for completing advanced directives discussed -see patient instructions below for any other recommendations provided  4)The following written screening schedule of preventive measures were reviewed with assessment and plan made per below, orders and patient instructions:      AAA screening     Alcohol screening done     Obesity Screening and counseling done     STI screening (Hep C if born 1943-65): na, declined     Tobacco Screening: done       Pneumococcal (PPSV23 -one dose after 64, one before if risk factors), influenza yearly and hepatitis B vaccines (if high risk - end stage renal disease, IV drugs, homosexual men, live in home for mentally retarded, hemophilia receiving factors) ASSESSMENT/PLAN: she reports had the ppsv23, wants her prevnar 13 as does not think she had this      Screening mammograph (yearly if >40) ASSESSMENT/PLAN: no longer doing this      Screening Pap smear/pelvic exam (q2 years) ASSESSMENT/PLAN: n/a      Prostate cancer screening ASSESSMENT/PLAN: n/a      Colorectal cancer screening (FOBT yearly or flex sig q4y or colonoscopy q10y or barium enema q4y) ASSESSMENT/PLAN: declined      Diabetes outpatient self-management training services ASSESSMENT/PLAN: n/a      Bone mass measurements(covered q2y if indicated - estrogen def, osteoporosis, hyperparathyroid, vertebral abnormalities, osteoporosis or steroids) ASSESSMENT/PLAN: done per her report, declines further      Screening for glaucoma(q1y  if high risk - diabetes, FH, AA and > 50 or hispanic and > 65) ASSESSMENT/PLAN: n/a      Medical nutritional therapy for individuals with diabetes or renal disease ASSESSMENT/PLAN: n/a      Cardiovascular screening blood tests (lipids q5y) ASSESSMENT/PLAN: will do labs at follow up as is not fasting      Diabetes screening tests ASSESSMENT/PLAN: will do at follow up   7.) Summary: -risk factors and conditions per above assessment were discussed and treatment, recommendations and referrals were offered per documentation above and orders and patient instructions.  Medicare annual wellness visit, subsequent -see above and scanned documentation  Essential hypertension, benign -continue current treatment  Hyperlipidemia -pt on odd regimen for cholesterol, denies ever taking statin, does not like current medication - niaspan causes flushing, welchol is expensive -discussed statins are probably the best in terms treating and reducing morbidity and mortality related to elevated cholesterol -she wants to try trial off medication then check fasting labs, then may consider statin if needed  Depression -reports mood is good, she may try to wean off celexa at some point  Gastroesophageal reflux disease without esophagitis -stop high dose nexium as seems no indication for  this, then try to wean off entirely  Memory loss - Plan: Ambulatory referral to Neurology -abnormal mini cog -discussed causes and options for eval and possible treatment -she decided to see neurology for eval - suspect vascular vs alzheimer's dementia - referral placed -on asa  Patient Instructions  BEFORE YOU LEAVE: -labs -Tetanus booster -prevnar 13 -schedule follow up in 2 months, come fasting for 8 hours but drink plenty of water and we will check your cholesterol  We will plan to do your flu shot at your follow up  Stop the nexium 40mg  and do the 20mg  daily for 3 months then stop   You can stop the niaspan  and the whelchol and we can recheck cholesterol in 3 months

## 2014-08-05 NOTE — Patient Instructions (Addendum)
BEFORE YOU LEAVE: -labs -Tetanus booster -prevnar 13 -schedule follow up in 2 months, come fasting for 8 hours but drink plenty of water and we will check your cholesterol  We will plan to do your flu shot at your follow up  Stop the nexium  and do the  daily for 3 months then stop   You can stop the niaspan and the whelchol and we can recheck cholesterol in 3 months

## 2014-09-05 ENCOUNTER — Other Ambulatory Visit: Payer: Self-pay | Admitting: Family

## 2014-09-19 ENCOUNTER — Telehealth: Payer: Self-pay | Admitting: Family Medicine

## 2014-09-19 MED ORDER — CITALOPRAM HYDROBROMIDE 20 MG PO TABS: 20.0000 mg | ORAL_TABLET | Freq: Every day | ORAL | Status: DC

## 2014-09-19 NOTE — Telephone Encounter (Signed)
Sent refill on celexa. She stopped the niaspan at the last visit.

## 2014-09-19 NOTE — Telephone Encounter (Signed)
[facility] - [place], [place] - [address]  Requesting refill of the following meds:  citalopram (CELEXA) 20 MG tablet Niaspan ER tabs  (do not see on patient's current medication list)

## 2014-09-19 NOTE — Telephone Encounter (Signed)
[facility] - [place], [place] - [address] [phone]  Requesting refill of citalopram (CELEXA) 20 MG tablet #90

## 2014-09-24 ENCOUNTER — Ambulatory Visit: Payer: Medicare Other | Admitting: Neurology

## 2014-10-09 ENCOUNTER — Ambulatory Visit (INDEPENDENT_AMBULATORY_CARE_PROVIDER_SITE_OTHER): Payer: Medicare Other | Admitting: Family Medicine

## 2014-10-09 ENCOUNTER — Encounter: Payer: Self-pay | Admitting: Family Medicine

## 2014-10-09 VITALS — BP 122/62 | HR 64 | Temp 97.9°F | Ht 68.0 in | Wt 163.7 lb

## 2014-10-09 DIAGNOSIS — D649 Anemia, unspecified: Secondary | ICD-10-CM

## 2014-10-09 DIAGNOSIS — I10 Essential (primary) hypertension: Secondary | ICD-10-CM | POA: Diagnosis not present

## 2014-10-09 DIAGNOSIS — Z23 Encounter for immunization: Secondary | ICD-10-CM | POA: Diagnosis not present

## 2014-10-09 DIAGNOSIS — F3342 Major depressive disorder, recurrent, in full remission: Secondary | ICD-10-CM

## 2014-10-09 DIAGNOSIS — R413 Other amnesia: Secondary | ICD-10-CM

## 2014-10-09 DIAGNOSIS — E785 Hyperlipidemia, unspecified: Secondary | ICD-10-CM

## 2014-10-09 DIAGNOSIS — I493 Ventricular premature depolarization: Secondary | ICD-10-CM | POA: Diagnosis not present

## 2014-10-09 DIAGNOSIS — Z1231 Encounter for screening mammogram for malignant neoplasm of breast: Secondary | ICD-10-CM | POA: Diagnosis not present

## 2014-10-09 LAB — LIPID PANEL
CHOLESTEROL: 205 mg/dL — AB (ref 0–200)
HDL: 60 mg/dL (ref >39.00)
LDL CALC: 134 mg/dL — AB (ref 0–99)
NONHDL: 144.73
Total CHOL/HDL Ratio: 3
Triglycerides: 55 mg/dL (ref 0.0–149.0)
VLDL: 11 mg/dL (ref 0.0–40.0)

## 2014-10-09 LAB — CBC WITH DIFFERENTIAL/PLATELET
Basophils Absolute: 0 10*3/uL (ref 0.0–0.1)
Basophils Relative: 0.4 % (ref 0.0–3.0)
EOS PCT: 3.9 % (ref 0.0–5.0)
Eosinophils Absolute: 0.2 10*3/uL (ref 0.0–0.7)
HEMATOCRIT: 34.2 % — AB (ref 36.0–46.0)
HEMOGLOBIN: 11.4 g/dL — AB (ref 12.0–15.0)
LYMPHS PCT: 22.9 % (ref 12.0–46.0)
Lymphs Abs: 1.2 10*3/uL (ref 0.7–4.0)
MCHC: 33.5 g/dL (ref 30.0–36.0)
MCV: 87.3 fl (ref 78.0–100.0)
MONOS PCT: 10.3 % (ref 3.0–12.0)
Monocytes Absolute: 0.5 10*3/uL (ref 0.1–1.0)
Neutro Abs: 3.3 10*3/uL (ref 1.4–7.7)
Neutrophils Relative %: 62.5 % (ref 43.0–77.0)
Platelets: 203 10*3/uL (ref 150.0–400.0)
RBC: 3.92 Mil/uL (ref 3.87–5.11)
RDW: 14.8 % (ref 11.5–15.5)
WBC: 5.3 10*3/uL (ref 4.0–10.5)

## 2014-10-09 LAB — BASIC METABOLIC PANEL
BUN: 23 mg/dL (ref 6–23)
CO2: 30 mEq/L (ref 19–32)
Calcium: 9.8 mg/dL (ref 8.4–10.5)
Chloride: 104 mEq/L (ref 96–112)
Creatinine, Ser: 1.13 mg/dL (ref 0.40–1.20)
GFR: 48.47 mL/min — AB (ref >60.00)
Glucose, Bld: 104 mg/dL — ABNORMAL HIGH (ref 70–99)
POTASSIUM: 5.1 meq/L (ref 3.5–5.1)
SODIUM: 143 meq/L (ref 135–145)

## 2014-10-09 LAB — HM MAMMOGRAPHY

## 2014-10-09 NOTE — Progress Notes (Signed)
HPI:  Depression: -chronic, reports on medication for this for at least 3-4 years -meds: celexa  daily -denies: depression, crying, sleep issues  HTN/Palpitations: -chronic, seen by cardiology in 2015 -meds with prior PCP: lisinopril-hctz 20-12.5, atenolol  daily -denies: CP, SOB, DOE, recent palpitations, dizziness  GERD: -chronic, reports has been on PPI for many years -meds: nexium  daily -denies: hx of hiatal hernia, dysphagia, GI bleed, ulcer or stricture, esophagitis  HLD: -chronic: -meds with prior PCP: welchol 37.5 daily, niaspan  makes her flush - she takes 81 mg of asa to counteract this -no hx of statin intolerance -she  Opted to stop these at her last visit and consider statin in the future if needed -she misunderstood and continues th niaspan and asa  Chronic anemia: -on labs -appears prior PCP told her to take iron -denies: weakness, malaise, palpitations, cold intol, bleeding  Cognitive declined: -she has recently noticed worsening memory issues with short term memory loss -denies: falls, feeling unsafe, weakness, depression now on medication -lives alone, feels safe, daughter lives near her   ROS: See pertinent positives and negatives per HPI.  Past Medical History  Diagnosis Date  . Hypertension   . A-fib (HCC)   . Fracture of left inferior pubic ramus (HCC) 05/04/2013  . Fracture of right inferior pubic ramus (HCC) 05/04/2013  . Fracture of right hip requiring operative repair [facility]) 10/07/2013    Past Surgical History  Procedure Laterality Date  . Abdominal hysterectomy  1971  . Bladder surgery  2010  . Broken pelvis  2015  . Hip pinning,cannulated Right 10/07/2013    Procedure: CANNULATED screws right hip;  Surgeon: Kathryne Hitch, MD;  Location: [facility] OR;  Service: Orthopedics;  Laterality: Right;    Family History  Problem Relation Age of Onset  . Prostate cancer Father   . Cancer Brother   . Cancer Sister   . Heart  attack Sister   . Cancer Sister     Social History   Social History  . Marital Status: Widowed    Spouse Name: N/A  . Number of Children: N/A  . Years of Education: N/A   Social History Main Topics  . Smoking status: Former Smoker    Quit date: 08/02/1970  . Smokeless tobacco: None     Comment: "smoked very little"  . Alcohol Use: No  . Drug Use: No  . Sexual Activity: Not Asked   Other Topics Concern  . None   Social History Narrative     Current outpatient prescriptions:  .  aspirin 81 MG tablet, Take 81 mg by mouth. Takes 2 once a day, Disp: , Rfl:  .  atenolol (TENORMIN) 50 MG tablet, Take 50 mg by mouth daily., Disp: , Rfl:  .  citalopram (CELEXA) 20 MG tablet, Take 1 tablet (20 mg total) by mouth daily., Disp: 90 tablet, Rfl: 3 .  esomeprazole (NEXIUM) 20 MG capsule, Take 1 capsule (20 mg total) by mouth daily at 12 noon., Disp: 90 capsule, Rfl: 0 .  lisinopril-hydrochlorothiazide (PRINZIDE,ZESTORETIC) 20-12.5 MG per tablet, TAKE 1 TABLET DAILY, Disp: 90 tablet, Rfl: 3 .  Multiple Vitamin (MULTIVITAMIN WITH MINERALS) TABS tablet, Take 1 tablet by mouth daily., Disp: , Rfl:  .  Vitamin D, Ergocalciferol, (DRISDOL) 50000 UNITS CAPS capsule, TAKE 1 CAPSULE EVERY 7 DAYS, Disp: 12 capsule, Rfl: 0 .  VITAMIN E PO, Take 1 tablet by mouth daily., Disp: , Rfl:   EXAM:  Filed Vitals:   10/09/14 1610  BP: 122/62  Pulse: 64  Temp: 97.9 F (36.6 C)    Body mass index is 24.9 kg/(m^2).  GENERAL: vitals reviewed and listed above, alert, oriented, appears well hydrated and in no acute distress  HEENT: atraumatic, conjunttiva clear, no obvious abnormalities on inspection of external nose and ears  NECK: no obvious masses on inspection  LUNGS: clear to auscultation bilaterally, no wheezes, rales or rhonchi, good air movement  CV: HRRR, no peripheral edema  MS: moves all extremities without noticeable abnormality  PSYCH: pleasant and cooperative, no obvious depression  or anxiety  ASSESSMENT AND PLAN:  Discussed the following assessment and plan:  Essential hypertension, benign - Plan: Basic metabolic panel  Hyperlipidemia - Plan: Lipid Panel  Asymptomatic PVCs  Recurrent major depressive disorder, in full remission (HCC)  Memory loss  Chronic anemia - Plan: CBC with Differential  -check labs -stop niaspan and consider statin if she wishes to tx cholesterol -flu shot -Patient advised to return or notify a doctor immediately if symptoms worsen or persist or new concerns arise.  Patient Instructions  BEFORE YOU LEAVE: -flu shot -labs -follow up in 3-4 months  -We have ordered labs or studies at this visit. It can take up to 1-2 weeks for results and processing. We will contact you with instructions IF your results are abnormal. Normal results will be released to your [facility]. If you have not heard from [place] or can not find your results in [facility] in 2 weeks please contact our office.  We recommend the following healthy lifestyle measures: - eat a healthy whole foods diet consisting of regular small meals composed of vegetables, fruits, beans, nuts, seeds, healthy meats such as white chicken and fish and whole grains.  - avoid sweets, white starchy foods, fried foods, fast food, processed foods, sodas, red meet and other fattening foods.  - get a least 150-300 minutes of aerobic exercise per week.            Kriste Basque R.

## 2014-10-09 NOTE — Patient Instructions (Signed)
BEFORE YOU LEAVE: -flu shot -labs -follow up in 3-4 months  -We have ordered labs or studies at this visit. It can take up to 1-2 weeks for results and processing. We will contact you with instructions IF your results are abnormal. Normal results will be released to your [facility]. If you have not heard from [place] or can not find your results in [facility] in 2 weeks please contact our office.  We recommend the following healthy lifestyle measures: - eat a healthy whole foods diet consisting of regular small meals composed of vegetables, fruits, beans, nuts, seeds, healthy meats such as white chicken and fish and whole grains.  - avoid sweets, white starchy foods, fried foods, fast food, processed foods, sodas, red meet and other fattening foods.  - get a least 150-300 minutes of aerobic exercise per week.

## 2014-10-09 NOTE — Progress Notes (Signed)
Pre visit review using our clinic review tool, if applicable. No additional management support is needed unless otherwise documented below in the visit note. 

## 2014-10-15 ENCOUNTER — Other Ambulatory Visit: Payer: Self-pay | Admitting: Family Medicine

## 2014-10-20 ENCOUNTER — Other Ambulatory Visit: Payer: Self-pay

## 2014-10-20 MED ORDER — ATENOLOL 50 MG PO TABS: 50.0000 mg | ORAL_TABLET | Freq: Every day | ORAL | Status: DC

## 2014-10-21 ENCOUNTER — Encounter: Payer: Self-pay | Admitting: Family Medicine

## 2014-10-21 ENCOUNTER — Other Ambulatory Visit: Payer: Self-pay | Admitting: Family

## 2014-11-07 ENCOUNTER — Telehealth: Payer: Self-pay | Admitting: Family Medicine

## 2014-11-07 MED ORDER — ATENOLOL 50 MG PO TABS: 50.0000 mg | ORAL_TABLET | Freq: Every day | ORAL | Status: DC

## 2014-11-07 NOTE — Telephone Encounter (Signed)
Rx done. 

## 2014-11-07 NOTE — Telephone Encounter (Signed)
Patient is requesting RX atenolol (TENORMIN) 50 MG tablet  90 day supply   Pharmacy: [facility]

## 2015-01-11 ENCOUNTER — Other Ambulatory Visit: Payer: Self-pay | Admitting: Family Medicine

## 2015-02-27 ENCOUNTER — Other Ambulatory Visit: Payer: Self-pay | Admitting: Family

## 2015-03-19 DIAGNOSIS — Z85828 Personal history of other malignant neoplasm of skin: Secondary | ICD-10-CM | POA: Diagnosis not present

## 2015-03-19 DIAGNOSIS — Z08 Encounter for follow-up examination after completed treatment for malignant neoplasm: Secondary | ICD-10-CM | POA: Diagnosis not present

## 2015-03-19 DIAGNOSIS — Z85820 Personal history of malignant melanoma of skin: Secondary | ICD-10-CM | POA: Diagnosis not present

## 2015-03-19 DIAGNOSIS — L821 Other seborrheic keratosis: Secondary | ICD-10-CM | POA: Diagnosis not present

## 2015-03-23 ENCOUNTER — Ambulatory Visit: Payer: Medicare Other | Admitting: Family Medicine

## 2015-03-30 ENCOUNTER — Encounter: Payer: Self-pay | Admitting: Family Medicine

## 2015-03-30 ENCOUNTER — Telehealth: Payer: Self-pay | Admitting: Family Medicine

## 2015-03-30 ENCOUNTER — Ambulatory Visit (INDEPENDENT_AMBULATORY_CARE_PROVIDER_SITE_OTHER): Payer: Medicare Other | Admitting: Family Medicine

## 2015-03-30 VITALS — BP 130/58 | HR 57 | Temp 98.3°F | Ht 68.0 in | Wt 160.8 lb

## 2015-03-30 DIAGNOSIS — K219 Gastro-esophageal reflux disease without esophagitis: Secondary | ICD-10-CM

## 2015-03-30 DIAGNOSIS — F325 Major depressive disorder, single episode, in full remission: Secondary | ICD-10-CM | POA: Diagnosis not present

## 2015-03-30 DIAGNOSIS — D649 Anemia, unspecified: Secondary | ICD-10-CM | POA: Diagnosis not present

## 2015-03-30 DIAGNOSIS — E785 Hyperlipidemia, unspecified: Secondary | ICD-10-CM | POA: Diagnosis not present

## 2015-03-30 DIAGNOSIS — R4189 Other symptoms and signs involving cognitive functions and awareness: Secondary | ICD-10-CM

## 2015-03-30 DIAGNOSIS — I10 Essential (primary) hypertension: Secondary | ICD-10-CM | POA: Diagnosis not present

## 2015-03-30 DIAGNOSIS — I493 Ventricular premature depolarization: Secondary | ICD-10-CM | POA: Diagnosis not present

## 2015-03-30 LAB — LIPID PANEL
CHOLESTEROL: 213 mg/dL — AB (ref 0–200)
HDL: 57.3 mg/dL (ref >39.00)
LDL Cholesterol: 142 mg/dL — ABNORMAL HIGH (ref 0–99)
NonHDL: 155.2
Total CHOL/HDL Ratio: 4
Triglycerides: 68 mg/dL (ref 0.0–149.0)
VLDL: 13.6 mg/dL (ref 0.0–40.0)

## 2015-03-30 LAB — CBC WITH DIFFERENTIAL/PLATELET
BASOS PCT: 0.6 % (ref 0.0–3.0)
Basophils Absolute: 0 10*3/uL (ref 0.0–0.1)
Eosinophils Absolute: 0.1 10*3/uL (ref 0.0–0.7)
Eosinophils Relative: 2.7 % (ref 0.0–5.0)
HCT: 34.5 % — ABNORMAL LOW (ref 36.0–46.0)
HEMOGLOBIN: 11.6 g/dL — AB (ref 12.0–15.0)
LYMPHS ABS: 1.1 10*3/uL (ref 0.7–4.0)
Lymphocytes Relative: 23.7 % (ref 12.0–46.0)
MCHC: 33.7 g/dL (ref 30.0–36.0)
MCV: 86.1 fl (ref 78.0–100.0)
MONO ABS: 0.5 10*3/uL (ref 0.1–1.0)
Monocytes Relative: 10.6 % (ref 3.0–12.0)
NEUTROS ABS: 2.9 10*3/uL (ref 1.4–7.7)
Neutrophils Relative %: 62.4 % (ref 43.0–77.0)
PLATELETS: 219 10*3/uL (ref 150.0–400.0)
RBC: 4 Mil/uL (ref 3.87–5.11)
RDW: 14.6 % (ref 11.5–15.5)
WBC: 4.7 10*3/uL (ref 4.0–10.5)

## 2015-03-30 MED ORDER — LISINOPRIL 20 MG PO TABS: 20.0000 mg | ORAL_TABLET | Freq: Every day | ORAL | Status: DC

## 2015-03-30 NOTE — Telephone Encounter (Signed)
Pt call to say that Dr Selena BattenKim was going to refer her to [facility] Neurologist . She call to ask if this has been done

## 2015-03-30 NOTE — Progress Notes (Signed)
Pre visit review using our clinic review tool, if applicable. No additional management support is needed unless otherwise documented below in the visit note. 

## 2015-03-30 NOTE — Telephone Encounter (Signed)
I left a detailed message at the pts phone number Dr Selena BattenKim wanted her to call the neurologist to schedule her own appt and the phone number is at the bottom of the after visit summary or check out paper she was given at her visit today.

## 2015-03-30 NOTE — Progress Notes (Signed)
HPI:  Tracey Rosario is here to establish care.  Last PCP and physical:  Has the following chronic problems that require follow up and concerns today:   Depression: -chronic, reports on medication for this for at least 3-4 years -meds: celexa  daily -denies: depression, crying, sleep issues  HTN/Palpitations: -chronic, seen by cardiology in 2015 -meds with prior PCP: lisinopril-hctz 20-12.5, atenolol  daily -she feels the meds are causing dry mouth -when stops atenolol gets palpitations -denies: CP, SOB, DOE, recent palpitations, dizziness  GERD: -chronic, reports has been on PPI for many years -meds: nexium  daily -denies: hx of hiatal hernia, dysphagia, GI bleed, ulcer or stricture, esophagitis  HLD: -chronic: -meds with prior PCP: welchol 37.5 daily, niaspan ( makes her flush) -sheopted to stop these at her last visit and consider statin in the future if needed -denies hx statin intolerence  Chronic anemia: -on labs -appears prior PCP told her to take iron -denies: weakness, malaise, palpitations, cold intol, bleeding  Cognitive declined: -she has recently noticed worsening memory issues with short term memory loss - referred to neurology last visit after abnormal MMSE, she canceled appt with Dr. Karel Jarvis as she reports she wanted to try an over the counter product for this first - this was not helpful and she agrees to reschedule eval -denies: falls, feeling unsafe, weakness, depression now on medication -lives alone, feels safe, daughter lives near her  ROS negative for unless reported above: fevers, unintentional weight loss, hearing or vision loss, chest pain, palpitations, struggling to breath, hemoptysis, melena, hematochezia, hematuria, falls, loc, si, thoughts of self harm  Past Medical History  Diagnosis Date  . Hypertension   . Palpitations 05/04/2013    saw cardiologist in 2015, he deleted A. Fib from the record as per note not  documentation to support  . GERD (gastroesophageal reflux disease) 05/04/2013  . Depression 10/07/2013  . Hyperlipemia   . Anemia   . Cognitive decline     referred to neurologist  . Hip fx (HCC)     r hip, bilat pubis rami    Past Surgical History  Procedure Laterality Date  . Abdominal hysterectomy  1971  . Bladder surgery  2010  . Broken pelvis  2015  . Hip pinning,cannulated Right 10/07/2013    Procedure: CANNULATED screws right hip;  Surgeon: Kathryne Hitch, MD;  Location: [facility] OR;  Service: Orthopedics;  Laterality: Right;    Family History  Problem Relation Age of Onset  . Prostate cancer Father   . Cancer Brother   . Cancer Sister   . Heart attack Sister   . Cancer Sister     Social History   Social History  . Marital Status: Widowed    Spouse Name: N/A  . Number of Children: N/A  . Years of Education: N/A   Social History Main Topics  . Smoking status: Former Smoker    Quit date: 08/02/1970  . Smokeless tobacco: None     Comment: "smoked very little"  . Alcohol Use: No  . Drug Use: No  . Sexual Activity: Not Asked   Other Topics Concern  . None   Social History Narrative   Work or School: none      Home Situation: lives alone, daughters live on street ([address])      Spiritual Beliefs: [employer]      Lifestyle: no regular exercise, diet is ok           Current outpatient prescriptions:  .  aspirin 81 MG tablet, Take 81 mg by mouth. Takes 2 once a day, Disp: , Rfl:  .  atenolol (TENORMIN) 50 MG tablet, Take 1 tablet (50 mg total) by mouth daily., Disp: 90 tablet, Rfl: 1 .  citalopram (CELEXA) 20 MG tablet, Take 1 tablet (20 mg total) by mouth daily., Disp: 90 tablet, Rfl: 3 .  Multiple Vitamin (MULTIVITAMIN WITH MINERALS) TABS tablet, Take 1 tablet by mouth daily., Disp: , Rfl:  .  NEXIUM 20 MG capsule, TAKE 1 CAPSULE DAILY AT 12 NOON, Disp: 90 capsule, Rfl: 1 .  Vitamin D, Ergocalciferol, (DRISDOL) 50000 UNITS CAPS capsule, TAKE 1  CAPSULE EVERY 7 DAYS, Disp: 12 capsule, Rfl: 1 .  VITAMIN E PO, Take 1 tablet by mouth daily., Disp: , Rfl:  .  lisinopril (PRINIVIL,ZESTRIL) 20 MG tablet, Take 1 tablet (20 mg total) by mouth daily., Disp: 90 tablet, Rfl: 3  EXAM:  Filed Vitals:   03/30/15 0942  BP: 130/58  Pulse: 57  Temp: 98.3 F (36.8 C)    Body mass index is 24.46 kg/(m^2).  GENERAL: vitals reviewed and listed above, alert, oriented, appears well hydrated and in no acute distress  HEENT: atraumatic, conjunttiva clear, no obvious abnormalities on inspection of external nose and ears  NECK: no obvious masses on inspection  LUNGS: clear to auscultation bilaterally, no wheezes, rales or rhonchi, good air movement  CV: HRRR, no peripheral edema  MS: moves all extremities without noticeable abnormality  PSYCH: pleasant and cooperative, no obvious depression or anxiety  ASSESSMENT AND PLAN:  Discussed the following assessment and plan:  Major depressive disorder with single episode, in full remission (HCC) -she may consider weaning of celexa at some point  Essential hypertension, benign -trial off diuretic to see if helps with dry mouth with close follow up to recheck  Asymptomatic PVCs -well controlled  Hyperlipidemia - Plan: Lipid panel -repeat lipid panel, she is agreeable to starting statin if needed  Gastroesophageal reflux disease, esophagitis presence not specified -stable  Anemia, unspecified anemia type - Plan: CBC with Differential -recheck  Cognitive impairment -she agrees to reschedule neurology evaluation  -We reviewed the PMH, PSH, FH, SH, Meds and Allergies. -We provided refills for any medications we will prescribe as needed. -We addressed current concerns per orders and patient instructions. -We have asked for records for pertinent exams, studies, vaccines and notes from previous providers. -We have advised patient to follow up per instructions below.   -Patient advised to  return or notify a doctor immediately if symptoms worsen or persist or new concerns arise.  Patient Instructions  BEFORE YOU LEAVE: -labs -schedule follow up in 1 month to check blood pressure and see how the dry mouth is  -STOP the lisinopril/hctz -go to pharmacy and pick up the plain lisinopril and take daily with the rest of your medications  -Call to reschedule neurology evaluation. [facility]  Neurologist  Address: [address], [place], [place] [address]  [phone]  -We have ordered labs or studies at this visit. It can take up to 1-2 weeks for results and processing. We will contact you with instructions IF your results are abnormal. Normal results will be released to your [facility]. If you have not heard from us or can not find your results in [facility] in 2 weeks please contact our office.            Kriste BasqueKIM, HANNAH R.

## 2015-03-30 NOTE — Patient Instructions (Addendum)
BEFORE YOU LEAVE: -labs -schedule follow up in 1 month to check blood pressure and see how the dry mouth is  -STOP the lisinopril/hctz -go to pharmacy and pick up the plain lisinopril and take daily with the rest of your medications  -Call to reschedule neurology evaluation. [facility]  Neurologist  Address: [address], [place], [place] [address]  [phone]  -We have ordered labs or studies at this visit. It can take up to 1-2 weeks for results and processing. We will contact you with instructions IF your results are abnormal. Normal results will be released to your [facility]. If you have not heard from us or can not find your results in [facility] in 2 weeks please contact our office.

## 2015-03-31 ENCOUNTER — Other Ambulatory Visit: Payer: Self-pay | Admitting: *Deleted

## 2015-03-31 DIAGNOSIS — R413 Other amnesia: Secondary | ICD-10-CM

## 2015-04-06 ENCOUNTER — Telehealth: Payer: Self-pay | Admitting: *Deleted

## 2015-04-06 ENCOUNTER — Other Ambulatory Visit: Payer: Self-pay | Admitting: Family Medicine

## 2015-04-06 NOTE — Telephone Encounter (Signed)
I called the pt and informed her per Dr Selena BattenKim she no longer needs to take vitamin D 1610950000 units once a week and to begin taking vitamin D3 1000 units once a day and she agreed.

## 2015-04-06 NOTE — Telephone Encounter (Signed)
I would prefer and advise Vit D3 1000 IU daily (available cheap over the counter.) This is usually just used short term. Thanks.

## 2015-04-07 ENCOUNTER — Other Ambulatory Visit (INDEPENDENT_AMBULATORY_CARE_PROVIDER_SITE_OTHER): Payer: Medicare Other

## 2015-04-07 ENCOUNTER — Encounter: Payer: Self-pay | Admitting: Neurology

## 2015-04-07 ENCOUNTER — Ambulatory Visit (INDEPENDENT_AMBULATORY_CARE_PROVIDER_SITE_OTHER): Payer: Medicare Other | Admitting: Neurology

## 2015-04-07 VITALS — BP 120/64 | HR 55 | Wt 162.0 lb

## 2015-04-07 DIAGNOSIS — I10 Essential (primary) hypertension: Secondary | ICD-10-CM | POA: Diagnosis not present

## 2015-04-07 DIAGNOSIS — R413 Other amnesia: Secondary | ICD-10-CM

## 2015-04-07 DIAGNOSIS — R251 Tremor, unspecified: Secondary | ICD-10-CM | POA: Diagnosis not present

## 2015-04-07 LAB — TSH: TSH: 4.42 u[IU]/mL (ref 0.35–4.50)

## 2015-04-07 LAB — VITAMIN B12: Vitamin B-12: 527 pg/mL (ref 211–911)

## 2015-04-07 NOTE — Progress Notes (Signed)
NEUROLOGY CONSULTATION NOTE  Tracey Fillersancy Markey MRN: [id number] DOB: 1926-04-11  Referring provider: Dr. Kriste BasqueHannah Kim Primary care provider: Dr. Kriste BasqueHannah Kim  Reason for consult:  Memory loss  Dear Dr Selena BattenKim:  Thank you for your kind referral of Tracey Rosario for consultation of the above symptoms. Although her history is well known to you, please allow me to reiterate it for the purpose of our medical record. The patient was accompanied to the clinic by her daughter who also provides collateral information. Records and images were personally reviewed where available.  HISTORY OF PRESENT ILLNESS: This is a pleasant 82 year old right-handed woman with a history of hypertension, depression, presenting for evaluation of worsening memory. She reports memory is "not too good." She started noticing memory changes around 2 years ago. Her daughter only started noticing them 6 months ago when the patient reported these to her. She mostly forgets names, sometimes conversations or dates. She frequently misplaces things. She has occasional word-finding difficulties. She denies getting lost driving. She lives by herself and denies any missed medications or bill payments. Her daughter reports her house is "perfect," no difficulties with personal hygiene or ADLs. Her daughter has noticed she occasionally repeats herself. No personality changes. She tried Prevagen which they did not feel was helpful.  She has had tremors in her hands, left more than right, for many years. She mostly notices them when waking up in the morning. She has fallen twice in the past 2 years, missing a step and fracturing her pelvis and right hip. She has dry mouth, neck, shoulder, and back pain, as well as constipation. She denies any focal numbness/tingling/weakness. She denies any headaches, dizziness, diplopia, dysarthria/dysphagia, incontinence, anosmia. She denies any head injuries or alcohol use. No family history of dementia.    Memory: not too good; cant think of names, some probs with conversation/dates; has not left stove on; no missed bills; seldom forgets meds; occl word-finding; for the past couple of years; denies getting lost drive; misplaces things No fhx; daughter also forgetful sometimes  Laboratory Data: Lab Results  Component Value Date   WBC 4.7 03/30/2015   HGB 11.6* 03/30/2015   HCT 34.5* 03/30/2015   MCV 86.1 03/30/2015   PLT 219.0 03/30/2015     Chemistry      Component Value Date/Time   NA 143 10/09/2014 0933   K 5.1 10/09/2014 0933   CL 104 10/09/2014 0933   CO2 30 10/09/2014 0933   BUN 23 10/09/2014 0933   CREATININE 1.13 10/09/2014 0933      Component Value Date/Time   CALCIUM 9.8 10/09/2014 0933   ALKPHOS 92 07/29/2013 1110   AST 19 07/29/2013 1110   ALT 11 07/29/2013 1110   BILITOT 0.5 07/29/2013 1110       PAST MEDICAL HISTORY: Past Medical History  Diagnosis Date  . Hypertension   . Palpitations 05/04/2013    saw cardiologist in 2015, he deleted A. Fib from the record as per note not documentation to support  . GERD (gastroesophageal reflux disease) 05/04/2013  . Depression 10/07/2013  . Hyperlipemia   . Anemia   . Cognitive decline     referred to neurologist  . Hip fx (HCC)     r hip, bilat pubis rami    PAST SURGICAL HISTORY: Past Surgical History  Procedure Laterality Date  . Abdominal hysterectomy  1971  . Bladder surgery  2010  . Broken pelvis  2015  . Hip pinning,cannulated Right 10/07/2013  Procedure: CANNULATED screws right hip;  Surgeon: Kathryne Hitch, MD;  Location: [facility] OR;  Service: Orthopedics;  Laterality: Right;    MEDICATIONS: Current Outpatient Prescriptions on File Prior to Visit  Medication Sig Dispense Refill  . aspirin 81 MG tablet Take 81 mg by mouth. Takes 2 once a day    . atenolol (TENORMIN) 50 MG tablet Take 1 tablet (50 mg total) by mouth daily. 90 tablet 1  . citalopram (CELEXA) 20 MG tablet Take 1 tablet (20 mg  total) by mouth daily. 90 tablet 3  . lisinopril (PRINIVIL,ZESTRIL) 20 MG tablet Take 1 tablet (20 mg total) by mouth daily. 90 tablet 3  . NEXIUM 20 MG capsule TAKE 1 CAPSULE DAILY AT 12 NOON 90 capsule 1   No current facility-administered medications on file prior to visit.    ALLERGIES: Allergies  Allergen Reactions  . Codeine Other (See Comments)    Makes her crazy    FAMILY HISTORY: Family History  Problem Relation Age of Onset  . Prostate cancer Father   . Cancer Brother   . Cancer Sister   . Heart attack Sister   . Cancer Sister     SOCIAL HISTORY: Social History   Social History  . Marital Status: Widowed    Spouse Name: N/A  . Number of Children: 2  . Years of Education: N/A   Occupational History  . Retired    Social History Main Topics  . Smoking status: Former Smoker    Types: Cigarettes    Quit date: 08/02/1970  . Smokeless tobacco: Never Used     Comment: "smoked very little"  . Alcohol Use: No  . Drug Use: No  . Sexual Activity: Not on file   Other Topics Concern  . Not on file   Social History Narrative   Work or School: none      Home Situation: lives alone, daughters live on street (susan cooke)      Spiritual Beliefs: [employer]      Lifestyle: no regular exercise, diet is ok          REVIEW OF SYSTEMS: Constitutional: No fevers, chills, or sweats, no generalized fatigue, change in appetite Eyes: No visual changes, double vision, eye pain Ear, nose and throat: No hearing loss, ear pain, nasal congestion, sore throat Cardiovascular: No chest pain, palpitations Respiratory:  No shortness of breath at rest or with exertion, wheezes GastrointestinaI: No nausea, vomiting, diarrhea, abdominal pain, fecal incontinence Genitourinary:  No dysuria, urinary retention or frequency Musculoskeletal:  + neck pain, back pain Integumentary: No rash, pruritus, skin lesions Neurological: as above Psychiatric: No depression, insomnia,  anxiety Endocrine: No palpitations, fatigue, diaphoresis, mood swings, change in appetite, change in weight, increased thirst Hematologic/Lymphatic:  No anemia, purpura, petechiae. Allergic/Immunologic: no itchy/runny eyes, nasal congestion, recent allergic reactions, rashes  PHYSICAL EXAM: Filed Vitals:   04/07/15 1238  BP: 120/64  Pulse: 55   General: No acute distress Head:  Normocephalic/atraumatic Eyes: Fundoscopic exam shows bilateral sharp discs, no vessel changes, exudates, or hemorrhages Neck: supple, no paraspinal tenderness, full range of motion Back: No paraspinal tenderness Heart: regular rate and rhythm Lungs: Clear to auscultation bilaterally. Vascular: No carotid bruits. Skin/Extremities: No rash, no edema Neurological Exam: Mental status: alert and oriented to person, place, and time, no dysarthria or aphasia, Fund of knowledge is appropriate.  Recent and remote memory are intact.  Attention and concentration are normal.    Able to name objects and repeat phrases. CDT 5/5  MMSE - Mini Mental State Exam 04/07/2015  Orientation to time 5  Orientation to Place 4  Registration 3  Attention/ Calculation 5  Recall 3  Language- name 2 objects 2  Language- repeat 1  Language- follow 3 step command 2  Language- read & follow direction 1  Write a sentence 1  Copy design 1  Total score 28   Cranial nerves: CN I: not tested CN II: pupils equal, round and reactive to light, visual fields intact, fundi unremarkable. CN III, IV, VI:  full range of motion, no nystagmus, no ptosis CN V: decreased cold on right V2 CN VII: upper and lower face symmetric CN VIII: hearing intact to finger rub CN IX, X: gag intact, uvula midline CN XI: sternocleidomastoid and trapezius muscles intact CN XII: tongue midline Bulk & Tone: normal, no cogwheeling, no fasciculations. Motor: 5/5 throughout with no pronator drift. Sensation: decreased cold over the right UE, decreased vibration to  right ankle, otherwise intact to light touch, pin, and joint position sense.  Romberg test negative Deep Tendon Reflexes: +2 throughout except for absent ankle jerks bilaterally, no ankle clonus Plantar responses: downgoing bilaterally Cerebellar: no incoordination on finger to nose testing Gait: narrow-based and steady, able to tandem walk adequately, fair arm swing Tremor: +left hand resting tremor, mild bilateral postural tremor. +tremor with writing using right hand Decreased fine finger movements on left hand. +postural instability  IMPRESSION: This is a pleasant 82 year old right-handed woman with a history of hypertension, depression, presenting for worsening memory. Her MMSE today is normal, 28/30. Her neurological exam shows subjective decrease in sensation over the right face and arm. She is noted to have a resting tremor on the left hand, decreased fine finger movements on this hand, postural instability. No other signs of Parkinsonism. MRI brain without contrast will be ordered to assess for underlying structural abnormality. We discussed different causes of memory changes, check TSH, B12. We discussed mood and effects on memory, she reports mood is good. We discussed the option of starting low dose cholinesterase inhibitors such as Aricept, including expectations from the medication and side effects. She agrees to hold off for now and follow-up in 6 months for re-evaluation. We have discussed her tremor, she is not significantly bothered by it at this time, continue to monitor clinically. We discussed the importance of control of vascular risk factors, physical exercise, and brain stimulation exercises for brain health. She will follow-up in 6 months.   Thank you for allowing me to participate in the care of this patient. Please do not hesitate to call for any questions or concerns.   Patrcia Dolly, M.D.  CC: Dr. Selena Batten

## 2015-04-07 NOTE — Patient Instructions (Signed)
1. Schedule MRI brain without contrast 2. Bloodwork for TSH, B12 3. Physical exercise and brain stimulation exercises are important for brain health 4. Follow-up in 6 months

## 2015-04-09 ENCOUNTER — Telehealth: Payer: Self-pay | Admitting: Family Medicine

## 2015-04-09 NOTE — Telephone Encounter (Signed)
Patient was notified of result. 

## 2015-04-09 NOTE — Telephone Encounter (Signed)
-----   Message from Van ClinesKaren M Aquino, MD sent at 04/08/2015  3:45 PM EDT ----- Pls let him know thyroid and B12 levels are normal. Thanks

## 2015-04-15 ENCOUNTER — Ambulatory Visit (HOSPITAL_COMMUNITY)
Admission: RE | Admit: 2015-04-15 | Discharge: 2015-04-15 | Disposition: A | Discharge status: Home / Self Care | Payer: Medicare Other | Source: Ambulatory Visit | Attending: Neurology | Admitting: Neurology

## 2015-04-15 DIAGNOSIS — R413 Other amnesia: Secondary | ICD-10-CM | POA: Insufficient documentation

## 2015-04-15 DIAGNOSIS — I6782 Cerebral ischemia: Secondary | ICD-10-CM | POA: Diagnosis not present

## 2015-04-24 ENCOUNTER — Other Ambulatory Visit: Payer: Self-pay | Admitting: Family Medicine

## 2015-04-30 ENCOUNTER — Ambulatory Visit (INDEPENDENT_AMBULATORY_CARE_PROVIDER_SITE_OTHER): Payer: Medicare Other | Admitting: Family Medicine

## 2015-04-30 ENCOUNTER — Encounter: Payer: Self-pay | Admitting: Family Medicine

## 2015-04-30 VITALS — BP 130/64 | HR 53 | Temp 98.0°F | Ht 68.0 in | Wt 166.8 lb

## 2015-04-30 DIAGNOSIS — I10 Essential (primary) hypertension: Secondary | ICD-10-CM | POA: Diagnosis not present

## 2015-04-30 DIAGNOSIS — E785 Hyperlipidemia, unspecified: Secondary | ICD-10-CM | POA: Diagnosis not present

## 2015-04-30 DIAGNOSIS — I999 Unspecified disorder of circulatory system: Secondary | ICD-10-CM | POA: Diagnosis not present

## 2015-04-30 DIAGNOSIS — I739 Peripheral vascular disease, unspecified: Secondary | ICD-10-CM | POA: Insufficient documentation

## 2015-04-30 MED ORDER — PRAVASTATIN SODIUM 20 MG PO TABS: 20.0000 mg | ORAL_TABLET | Freq: Every day | ORAL | Status: DC

## 2015-04-30 NOTE — Progress Notes (Signed)
Pre visit review using our clinic review tool, if applicable. No additional management support is needed unless otherwise documented below in the visit note. 

## 2015-04-30 NOTE — Progress Notes (Signed)
HPI:  Follow up HTN: -changed medications as she felt they were causing dry mouth - hctz held -current meds lisinopril, atenolol (has palpitations when not on this), asa -reports:  Dry mouth has improved off the diuretic -denies: as pain, shortness of breath, swelling, headache  Cognitive decline: -saw Dr. Karel JarvisAquino -appreciate recs -MRI 04/2015 with moderate chronic small vessel dz, TSH and B12 ok -on niaspan and welchol in the past for cholesterol, she stopped, she opted not to take statin  At one point - now she is considering a cholesterol medication again - on asa daily  ROS: See pertinent positives and negatives per HPI.  Past Medical History  Diagnosis Date  . Hypertension   . Palpitations 05/04/2013    saw cardiologist in 2015, he deleted A. Fib from the record as per note not documentation to support  . GERD (gastroesophageal reflux disease) 05/04/2013  . Depression 10/07/2013  . Hyperlipemia   . Anemia   . Cognitive decline     referred to neurologist  . Hip fx (HCC)     r hip, bilat pubis rami    Past Surgical History  Procedure Laterality Date  . Abdominal hysterectomy  1971  . Bladder surgery  2010  . Broken pelvis  2015  . Hip pinning,cannulated Right 10/07/2013    Procedure: CANNULATED screws right hip;  Surgeon: Kathryne Hitchhristopher Y Blackman, MD;  Location: [facility] OR;  Service: Orthopedics;  Laterality: Right;    Family History  Problem Relation Age of Onset  . Prostate cancer Father   . Cancer Brother   . Cancer Sister   . Heart attack Sister   . Cancer Sister     Social History   Social History  . Marital Status: Widowed    Spouse Name: N/A  . Number of Children: 2  . Years of Education: N/A   Occupational History  . Retired    Social History Main Topics  . Smoking status: Former Smoker    Types: Cigarettes    Quit date: 08/02/1970  . Smokeless tobacco: Never Used     Comment: "smoked very little"  . Alcohol Use: No  . Drug Use: No  . Sexual  Activity: Not Asked   Other Topics Concern  . None   Social History Narrative   Work or School: none      Home Situation: lives alone, daughters live on street (susan cooke)      Spiritual Beliefs: [employer]      Lifestyle: no regular exercise, diet is ok           Current outpatient prescriptions:  .  aspirin 81 MG tablet, Take 81 mg by mouth. Takes 2 once a day, Disp: , Rfl:  .  atenolol (TENORMIN) 50 MG tablet, TAKE 1 TABLET DAILY, Disp: 90 tablet, Rfl: 1 .  cholecalciferol (VITAMIN D) 1000 units tablet, Take 1,000 Units by mouth daily., Disp: , Rfl:  .  citalopram (CELEXA) 20 MG tablet, Take 1 tablet (20 mg total) by mouth daily., Disp: 90 tablet, Rfl: 3 .  lisinopril (PRINIVIL,ZESTRIL) 20 MG tablet, Take 1 tablet (20 mg total) by mouth daily., Disp: 90 tablet, Rfl: 3 .  NEXIUM 20 MG capsule, TAKE 1 CAPSULE DAILY AT 12 NOON, Disp: 90 capsule, Rfl: 1 .  pravastatin (PRAVACHOL) 20 MG tablet, Take 1 tablet (20 mg total) by mouth daily., Disp: 90 tablet, Rfl: 3  EXAM:  Filed Vitals:   04/30/15 1001  BP: 130/64  Pulse: 53  Temp: 98  F (36.7 C)    Body mass index is 25.37 kg/(m^2).  GENERAL: vitals reviewed and listed above, alert, oriented, appears well hydrated and in no acute distress  HEENT: atraumatic, conjunttiva clear, no obvious abnormalities on inspection of external nose and ears  NECK: no obvious masses on inspection  LUNGS: clear to auscultation bilaterally, no wheezes, rales or rhonchi, good air movement  CV: HRRR, no peripheral edema  MS: moves all extremities without noticeable abnormality  PSYCH: pleasant and cooperative, no obvious depression or anxiety  ASSESSMENT AND PLAN:  Discussed the following assessment and plan:  Essential hypertension, benign  Hyperlipidemia - Plan: pravastatin (PRAVACHOL) 20 MG tablet  Small vessel disease (HCC)  - blood pressure was a little elevated on arrival, she thinks due to stress driving here because she  was late, improved on recheck and opted to continue current medications -discussed use of cholesterol medication per ACC/AHA guidelines, risks/benefits - she is > 75, some risks and she is concerned about preventing stroke and feels pretty healthy despite her age - she wants to try a low dose statin for her cholesterol - perhaps every other day -Patient advised to return or notify a doctor immediately if symptoms worsen or persist or new concerns arise.  Patient Instructions  BEFORE YOU LEAVE: -please ensure you have follow up in 3-4 months  I sent the cholesterol medication (pravastatin) to the pharmacy to take once daily or every other day  We recommend the following healthy lifestyle measures: - eat a healthy whole foods diet consisting of regular small meals composed of vegetables, fruits, beans, nuts, seeds, healthy meats such as white chicken and fish and whole grains.  - avoid sweets, white starchy foods, fried foods, fast food, processed foods, sodas, red meet and other fattening foods.  - get a least 150-300 minutes of aerobic exercise per week.       Kriste Basque R.

## 2015-04-30 NOTE — Patient Instructions (Signed)
BEFORE YOU LEAVE: -please ensure you have follow up in 3-4 months  I sent the cholesterol medication (pravastatin) to the pharmacy to take once daily or every other day  We recommend the following healthy lifestyle measures: - eat a healthy whole foods diet consisting of regular small meals composed of vegetables, fruits, beans, nuts, seeds, healthy meats such as white chicken and fish and whole grains.  - avoid sweets, white starchy foods, fried foods, fast food, processed foods, sodas, red meet and other fattening foods.  - get a least 150-300 minutes of aerobic exercise per week.

## 2015-05-05 ENCOUNTER — Emergency Department (HOSPITAL_COMMUNITY): Payer: Medicare Other

## 2015-05-05 ENCOUNTER — Emergency Department (HOSPITAL_COMMUNITY)
Admission: EM | Admit: 2015-05-05 | Discharge: 2015-05-05 | Disposition: A | Discharge status: Home / Self Care | Payer: Medicare Other | Attending: Emergency Medicine | Admitting: Emergency Medicine

## 2015-05-05 ENCOUNTER — Encounter (HOSPITAL_COMMUNITY): Payer: Self-pay

## 2015-05-05 DIAGNOSIS — R51 Headache: Secondary | ICD-10-CM | POA: Insufficient documentation

## 2015-05-05 DIAGNOSIS — I10 Essential (primary) hypertension: Secondary | ICD-10-CM | POA: Insufficient documentation

## 2015-05-05 DIAGNOSIS — Z79899 Other long term (current) drug therapy: Secondary | ICD-10-CM | POA: Insufficient documentation

## 2015-05-05 DIAGNOSIS — Z87891 Personal history of nicotine dependence: Secondary | ICD-10-CM | POA: Diagnosis not present

## 2015-05-05 DIAGNOSIS — R519 Headache, unspecified: Secondary | ICD-10-CM

## 2015-05-05 DIAGNOSIS — K219 Gastro-esophageal reflux disease without esophagitis: Secondary | ICD-10-CM | POA: Insufficient documentation

## 2015-05-05 DIAGNOSIS — E785 Hyperlipidemia, unspecified: Secondary | ICD-10-CM | POA: Insufficient documentation

## 2015-05-05 DIAGNOSIS — Z7982 Long term (current) use of aspirin: Secondary | ICD-10-CM | POA: Diagnosis not present

## 2015-05-05 DIAGNOSIS — Z8781 Personal history of (healed) traumatic fracture: Secondary | ICD-10-CM | POA: Diagnosis not present

## 2015-05-05 DIAGNOSIS — F329 Major depressive disorder, single episode, unspecified: Secondary | ICD-10-CM | POA: Insufficient documentation

## 2015-05-05 DIAGNOSIS — Z862 Personal history of diseases of the blood and blood-forming organs and certain disorders involving the immune mechanism: Secondary | ICD-10-CM | POA: Diagnosis not present

## 2015-05-05 LAB — CBC WITH DIFFERENTIAL/PLATELET
BASOS ABS: 0 10*3/uL (ref 0.0–0.1)
Basophils Relative: 0 %
EOS PCT: 2 %
Eosinophils Absolute: 0.1 10*3/uL (ref 0.0–0.7)
HEMATOCRIT: 32.1 % — AB (ref 36.0–46.0)
HEMOGLOBIN: 10.6 g/dL — AB (ref 12.0–15.0)
LYMPHS PCT: 23 %
Lymphs Abs: 1.2 10*3/uL (ref 0.7–4.0)
MCH: 28.2 pg (ref 26.0–34.0)
MCHC: 33 g/dL (ref 30.0–36.0)
MCV: 85.4 fL (ref 78.0–100.0)
Monocytes Absolute: 0.5 10*3/uL (ref 0.1–1.0)
Monocytes Relative: 9 %
NEUTROS ABS: 3.3 10*3/uL (ref 1.7–7.7)
NEUTROS PCT: 66 %
PLATELETS: 201 10*3/uL (ref 150–400)
RBC: 3.76 MIL/uL — AB (ref 3.87–5.11)
RDW: 14.1 % (ref 11.5–15.5)
WBC: 5.1 10*3/uL (ref 4.0–10.5)

## 2015-05-05 LAB — BASIC METABOLIC PANEL
ANION GAP: 9 (ref 5–15)
BUN: 14 mg/dL (ref 6–20)
CHLORIDE: 108 mmol/L (ref 101–111)
CO2: 23 mmol/L (ref 22–32)
Calcium: 9.5 mg/dL (ref 8.9–10.3)
Creatinine, Ser: 1.01 mg/dL — ABNORMAL HIGH (ref 0.44–1.00)
GFR calc Af Amer: 56 mL/min — ABNORMAL LOW (ref >60)
GFR, EST NON AFRICAN AMERICAN: 49 mL/min — AB (ref >60)
GLUCOSE: 77 mg/dL (ref 65–99)
POTASSIUM: 4.5 mmol/L (ref 3.5–5.1)
Sodium: 140 mmol/L (ref 135–145)

## 2015-05-05 MED ORDER — LISINOPRIL 20 MG PO TABS
20.0000 mg | ORAL_TABLET | Freq: Once | ORAL | Status: AC
  Administered 2015-05-05: 20 mg via ORAL
  Filled 2015-05-05: qty 1

## 2015-05-05 MED ORDER — IBUPROFEN 400 MG PO TABS
400.0000 mg | ORAL_TABLET | Freq: Once | ORAL | Status: AC
  Administered 2015-05-05: 400 mg via ORAL
  Filled 2015-05-05: qty 1

## 2015-05-05 MED ORDER — ACETAMINOPHEN 325 MG PO TABS
650.0000 mg | ORAL_TABLET | Freq: Once | ORAL | Status: AC
  Administered 2015-05-05: 650 mg via ORAL
  Filled 2015-05-05: qty 2

## 2015-05-05 MED ORDER — CLONIDINE HCL 0.2 MG PO TABS
0.2000 mg | ORAL_TABLET | Freq: Once | ORAL | Status: AC
  Administered 2015-05-05: 0.2 mg via ORAL
  Filled 2015-05-05: qty 1

## 2015-05-05 NOTE — ED Provider Notes (Signed)
CSN: [id number]     Arrival date & time 05/05/15  4098 History   First MD Initiated Contact with Patient 05/05/15 1104     Chief Complaint  Patient presents with  . Headache     (Consider location/radiation/quality/duration/timing/severity/associated sxs/prior Treatment) HPI Comments: Patient presents with history of one week of frontal headache with pressure around her eyes. Onset of symptoms were gradual and became worse. Patient took medication for allergies followed by Sudafed without relief. She has been taking ibuprofen which seems to help. Patient denies signs of stroke including: facial droop, slurred speech, aphasia, weakness/numbness in extremities, imbalance/trouble walking. Patient states that it is hard for her to see but she denies black areas of vision or blurry vision. Daughter reports patient has been using a magnifying glass to help read. Patient does report photophobia. No associated nausea or vomiting. No head injury or anticoagulant use.   Pt is hypertensive in ED. She states that she is on blood pressure medication and took these this morning.   MRI performed 04/15/15 for memory evaluation: No acute or reversible finding. Moderate chronic small-vessel ischemic changes.  Patient is a 82 y.o. female presenting with headaches. The history is provided by the patient and a relative.  Headache Associated symptoms: no congestion, no fever, no nausea, no neck pain, no neck stiffness, no numbness, no photophobia, no sinus pressure, no vomiting and no weakness     Past Medical History  Diagnosis Date  . Hypertension   . Palpitations 05/04/2013    saw cardiologist in 2015, he deleted A. Fib from the record as per note not documentation to support  . GERD (gastroesophageal reflux disease) 05/04/2013  . Depression 10/07/2013  . Hyperlipemia   . Anemia   . Cognitive decline     referred to neurologist  . Hip fx (HCC)     r hip, bilat pubis rami   Past Surgical History  Procedure  Laterality Date  . Abdominal hysterectomy  1971  . Bladder surgery  2010  . Broken pelvis  2015  . Hip pinning,cannulated Right 10/07/2013    Procedure: CANNULATED screws right hip;  Surgeon: Kathryne Hitch, MD;  Location: [facility] OR;  Service: Orthopedics;  Laterality: Right;   Family History  Problem Relation Age of Onset  . Prostate cancer Father   . Cancer Brother   . Cancer Sister   . Heart attack Sister   . Cancer Sister    Social History  Substance Use Topics  . Smoking status: Former Smoker    Types: Cigarettes    Quit date: 08/02/1970  . Smokeless tobacco: Never Used     Comment: "smoked very little"  . Alcohol Use: No   OB History    No data available     Review of Systems  Constitutional: Negative for fever.  HENT: Negative for congestion, dental problem, rhinorrhea and sinus pressure.   Eyes: Negative for photophobia, discharge, redness and visual disturbance.  Respiratory: Negative for shortness of breath.   Cardiovascular: Negative for chest pain.  Gastrointestinal: Negative for nausea and vomiting.  Musculoskeletal: Negative for gait problem, neck pain and neck stiffness.  Skin: Negative for rash.  Neurological: Positive for headaches. Negative for syncope, speech difficulty, weakness, light-headedness and numbness.  Psychiatric/Behavioral: Negative for confusion.    Allergies  Codeine  Home Medications   Prior to Admission medications   Medication Sig Start Date End Date Taking? Authorizing Provider  aspirin 81 MG tablet Take 81 mg by mouth. Takes 2 once  a day    Historical Provider, MD  atenolol (TENORMIN) 50 MG tablet TAKE 1 TABLET DAILY 04/24/15   [name], DO  cholecalciferol (VITAMIN D) 1000 units tablet Take 1,000 Units by mouth daily.    Historical Provider, MD  citalopram (CELEXA) 20 MG tablet Take 1 tablet (20 mg total) by mouth daily. 09/19/14   [name], DO  lisinopril (PRINIVIL,ZESTRIL) 20 MG tablet Take 1 tablet (20 mg total) by  mouth daily. 03/30/15   [name], DO  NEXIUM 20 MG capsule TAKE 1 CAPSULE DAILY AT 12 NOON 01/12/15   [name], DO  pravastatin (PRAVACHOL) 20 MG tablet Take 1 tablet (20 mg total) by mouth daily. 04/30/15   [name], DO   BP 178/51 mmHg  Pulse 52  Temp(Src) 97.9 F (36.6 C) (Oral)  Resp 17  SpO2 96%   Physical Exam  Constitutional: She is oriented to person, place, and time. She appears well-developed and well-nourished.  HENT:  Head: Normocephalic and atraumatic.  Right Ear: Tympanic membrane, external ear and ear canal normal.  Left Ear: Tympanic membrane, external ear and ear canal normal.  Nose: No mucosal edema or rhinorrhea. Right sinus exhibits maxillary sinus tenderness and frontal sinus tenderness. Left sinus exhibits maxillary sinus tenderness and frontal sinus tenderness.  Mouth/Throat: Uvula is midline, oropharynx is clear and moist and mucous membranes are normal.  Eyes: Conjunctivae, EOM and lids are normal. Pupils are equal, round, and reactive to light. Right eye exhibits no nystagmus. Left eye exhibits no nystagmus.  Neck: Normal range of motion. Neck supple.  Cardiovascular: Normal rate and regular rhythm.   Pulmonary/Chest: Effort normal and breath sounds normal.  Abdominal: Soft. There is no tenderness.  Musculoskeletal:       Cervical back: She exhibits normal range of motion, no tenderness and no bony tenderness.  Neurological: She is alert and oriented to person, place, and time. She has normal strength and normal reflexes. No cranial nerve deficit or sensory deficit. She displays a negative Romberg sign. Coordination and gait normal. GCS eye subscore is 4. GCS verbal subscore is 5. GCS motor subscore is 6.  Skin: Skin is warm and dry.  Psychiatric: She has a normal mood and affect.  Nursing note and vitals reviewed.   ED Course  Procedures (including critical care time) Labs Review Labs Reviewed  CBC WITH DIFFERENTIAL/PLATELET - Abnormal; Notable  for the following:    RBC 3.76 (*)    Hemoglobin 10.6 (*)    HCT 32.1 (*)    All other components within normal limits  BASIC METABOLIC PANEL - Abnormal; Notable for the following:    Creatinine, Ser 1.01 (*)    GFR calc non Af Amer 49 (*)    GFR calc Af Amer 56 (*)    All other components within normal limits    Imaging Review Ct Head Wo Contrast  05/05/2015  CLINICAL DATA:  Intermittent frontal headache for 1 week EXAM: CT HEAD WITHOUT CONTRAST TECHNIQUE: Contiguous axial images were obtained from the base of the skull through the vertex without intravenous contrast. COMPARISON:  Brain MRI 04/15/2015 FINDINGS: Brain: No intracranial hemorrhage, mass effect or midline shift. Stable cerebral atrophy. Stable periventricular chronic white matter disease. Stable patchy subcortical chronic white matter disease. No acute cortical infarction. No mass lesion is noted on this unenhanced scan. Ventricular size is stable from prior exam. Vascular: Atherosclerotic calcifications of carotid siphon again noted. Skull: Negative for fracture or focal lesion.  Sinuses/Orbits: No acute findings. Other: None. IMPRESSION: No acute intracranial abnormality. Stable atrophy and chronic white matter disease. No definite acute cortical infarction. Electronically Signed   By: Natasha MeadLiviu  Pop M.D.   On: 05/05/2015 14:14   I have personally reviewed and evaluated these images and lab results as part of my medical decision-making.  11:17 AM Patient seen and examined. Work-up initiated. Medications ordered.   Vital signs reviewed and are as follows: BP 178/51 mmHg  Pulse 52  Temp(Src) 97.9 F (36.6 C) (Oral)  Resp 17  SpO2 96%  2:27 PM Patent now back from CT. Pt discussed with and seen by Dr. Particia NearingHaviland. No emergent conditions identified. Will have patient d/c pseudoephedrine. She has received extra dose of lisinopril here. She is encouraged to f/u with PCP for BP recheck. Pt requests ibuprofen, okay given by Dr. Particia NearingHaviland.  BP is slightly improved. Anticipate d/c to home.   BP 193/64 mmHg  Pulse 101  Temp(Src) 97.9 F (36.6 C) (Oral)  Resp 14  SpO2 92%   2:52 PM Patient is stable. Pt is comfortable with d/c to home. Patient and daughter will follow-up with PCP this week. Daughter states that HTN med dosage was recently decreased. Also, they will follow-up with eye specialist to determine if this may be contributing to symptoms.  Encouraged d/c of decongestant. Also, only take ibuprofen if absolutely necessary at smallest dose needed.   Patient counseled to return if they have weakness in their arms or legs, slurred speech, trouble walking or talking, confusion, trouble with their balance, or if they have any other concerns. Patient verbalizes understanding and agrees with plan.       MDM   Final diagnoses:  Acute nonintractable headache, unspecified headache type  Essential hypertension   Patient without high-risk features of headache including: sudden onset/thunderclap HA, no similar headache in past, altered mental status, accompanying seizure, headache with exertion, history of immunocompromise, neck or shoulder pain, fever, use of anticoagulation, family history of spontaneous SAH, concomitant drug use, toxic exposure.   She has had some difficulty seeing but no eye pain or swelling/redness to suggest acute angle closure glaucoma.   Patient has a normal complete neurological exam, normal vital signs, normal level of consciousness, no signs of meningismus, is well-appearing/non-toxic appearing, no signs of trauma. No pain over temporal arteries and have low suspicion for temporal arteritis.   Imaging with CT/MRI not indicated given history and physical exam findings.   No dangerous or life-threatening conditions suspected or identified by history, physical exam, and by work-up. No indications for hospitalization identified.      Renne CriglerJoshua Rosanne Wohlfarth, PA-C 05/05/15 1456

## 2015-05-05 NOTE — ED Notes (Signed)
This RN spoke with Westley GamblesJosh G, PA regarding the discharge of this patient with her Oxygen being 88-90% when laying. Pt denied any trouble breathing. PA instructed this RN to ambulate pt with pulse ox. Pt independently ambulated around pod E with steady gait. She denied being dizzy and her oxygen saturation maintained around 95-96% RA with HR 58. Josh, PA made aware and states patient is okay to be discharged home. Last BP was 155/56.

## 2015-05-05 NOTE — ED Notes (Signed)
Contacted pharmacist to ensure ok to give clonidine with heartrate of 45.   Pharmacy advised okay to give.

## 2015-05-05 NOTE — Discharge Instructions (Signed)
Please read and follow all provided instructions.  Your diagnoses today include:  1. Acute nonintractable headache, unspecified headache type   2. Essential hypertension    Tests performed today include:  CT of your head which was normal and did not show any serious cause of your headache, no sinus disease  Blood counts and electrolytes - normal  Vital signs. See below for your results today.   Additional information:  Follow any educational materials contained in this packet.  You are having a headache. No specific cause was found today for your headache. It may have been a migraine or other cause of headache. Stress, anxiety, fatigue, and depression are common triggers for headaches.   Your headache today does not appear to be life-threatening or require hospitalization, but often the exact cause of headaches is not determined in the emergency department. Therefore, follow-up with your doctor is very important to find out what may have caused your headache and whether or not you need any further diagnostic testing or treatment.   Sometimes headaches can appear benign (not harmful), but then more serious symptoms can develop which should prompt an immediate re-evaluation by your doctor or the emergency department.  BE VERY CAREFUL not to take multiple medicines containing Tylenol (also called acetaminophen). Doing so can lead to an overdose which can damage your liver and cause liver failure and possibly death.   Follow-up instructions: Please follow-up with your primary care provider in the next 2 days for further evaluation of your symptoms. Please contact your eye doctor for a check of your eyes.   Return instructions:   Please return to the Emergency Department if you experience worsening symptoms.  Return if the medications do not resolve your headache, if it recurs, or if you have multiple episodes of vomiting or cannot keep down fluids.  Return if you have a change from the  usual headache.  RETURN IMMEDIATELY IF you:  Develop a sudden, severe headache  Develop confusion or become poorly responsive or faint  Develop a fever above 100.49F or problem breathing  Have a change in speech, vision, swallowing, or understanding  Develop new weakness, numbness, tingling, incoordination in your arms or legs  Have a seizure  Please return if you have any other emergent concerns.  Additional Information:  Your vital signs today were: BP 198/63 mmHg   Pulse 45   Temp(Src) 97.9 F (36.6 C) (Oral)   Resp 17   SpO2 95% If your blood pressure (BP) was elevated above 135/85 this visit, please have this repeated by your doctor within one month. --------------

## 2015-05-05 NOTE — ED Notes (Signed)
Patient here with 1 week of frontal headache and eye pain. Denies trauma. Nausea without vomiting.  Alert and oriented

## 2015-05-06 DIAGNOSIS — H02833 Dermatochalasis of right eye, unspecified eyelid: Secondary | ICD-10-CM | POA: Diagnosis not present

## 2015-05-06 DIAGNOSIS — H35371 Puckering of macula, right eye: Secondary | ICD-10-CM | POA: Diagnosis not present

## 2015-05-06 DIAGNOSIS — H35372 Puckering of macula, left eye: Secondary | ICD-10-CM | POA: Diagnosis not present

## 2015-05-06 DIAGNOSIS — H35342 Macular cyst, hole, or pseudohole, left eye: Secondary | ICD-10-CM | POA: Diagnosis not present

## 2015-05-06 DIAGNOSIS — Z961 Presence of intraocular lens: Secondary | ICD-10-CM | POA: Diagnosis not present

## 2015-05-06 DIAGNOSIS — H01003 Unspecified blepharitis right eye, unspecified eyelid: Secondary | ICD-10-CM | POA: Diagnosis not present

## 2015-05-06 DIAGNOSIS — H35373 Puckering of macula, bilateral: Secondary | ICD-10-CM | POA: Diagnosis not present

## 2015-05-08 ENCOUNTER — Encounter: Payer: Self-pay | Admitting: Family Medicine

## 2015-05-08 ENCOUNTER — Ambulatory Visit (INDEPENDENT_AMBULATORY_CARE_PROVIDER_SITE_OTHER): Payer: Medicare Other | Admitting: Family Medicine

## 2015-05-08 VITALS — BP 142/70 | HR 64 | Temp 98.4°F | Ht 68.0 in | Wt 162.6 lb

## 2015-05-08 DIAGNOSIS — R51 Headache: Secondary | ICD-10-CM

## 2015-05-08 DIAGNOSIS — D649 Anemia, unspecified: Secondary | ICD-10-CM

## 2015-05-08 DIAGNOSIS — I10 Essential (primary) hypertension: Secondary | ICD-10-CM | POA: Diagnosis not present

## 2015-05-08 DIAGNOSIS — R519 Headache, unspecified: Secondary | ICD-10-CM

## 2015-05-08 MED ORDER — FLUTICASONE PROPIONATE 50 MCG/ACT NA SUSP: 1.0000 | Freq: Every day | NASAL | Status: DC

## 2015-05-08 MED ORDER — AMLODIPINE BESYLATE 2.5 MG PO TABS: 2.5000 mg | ORAL_TABLET | Freq: Every day | ORAL | Status: DC

## 2015-05-08 NOTE — Progress Notes (Signed)
HPI:  Tracey Rosario Is a very pleasant 82 year old here for an acute visit for the following:  HTN: -elevated at emergency visit for headache and elevated blood pressure - Recently stopped her diuretic due to concerns of this causing dry mouth - Her blood pressure was great here recently, but was elevated in the emergency room and her daughter has been keeping a log at home with systolicblood pressures in the 170s to 180s  - has had a headache, see below - No chest pain, shortness of breath dyspnea on exertion, swelling or palpitations - Negative eval in the emergency room, has chronic anemia and hemoglobin a little lower than in the past  Headache: - For about 10 days -has had some allergy symptoms including clear nasal congestion Headache is bifrontal just above the eyebrows - No vision changes, sinus pain, thick nasal congestion, weakness, numbness, nausea, vomiting - Eval in emergency room recently with negative CT scan - over-the-counter ibuprofen helps and she has been taking this  Chronic anemia: - Was on iron therapy with prior PCP, no longer taking - Denies melena, bright red blood per rectum, any bleeding issues, weakness, palpitations - Discussed with daughter and patient and they don't wish to proceed with further evaluation for this at this time that are sitting restarting the iron supplement   ROS: See pertinent positives and negatives per HPI.  Past Medical History  Diagnosis Date  . Hypertension   . Palpitations 05/04/2013    saw cardiologist in 2015, he deleted A. Fib from the record as per note not documentation to support  . GERD (gastroesophageal reflux disease) 05/04/2013  . Depression 10/07/2013  . Hyperlipemia   . Anemia   . Cognitive decline     referred to neurologist  . Hip fx (HCC)     r hip, bilat pubis rami    Past Surgical History  Procedure Laterality Date  . Abdominal hysterectomy  1971  . Bladder surgery  2010  . Broken pelvis  2015  .  Hip pinning,cannulated Right 10/07/2013    Procedure: CANNULATED screws right hip;  Surgeon: Kathryne Hitch, MD;  Location: [facility] OR;  Service: Orthopedics;  Laterality: Right;    Family History  Problem Relation Age of Onset  . Prostate cancer Father   . Cancer Brother   . Cancer Sister   . Heart attack Sister   . Cancer Sister     Social History   Social History  . Marital Status: Widowed    Spouse Name: N/A  . Number of Children: 2  . Years of Education: N/A   Occupational History  . Retired    Social History Main Topics  . Smoking status: Former Smoker    Types: Cigarettes    Quit date: 08/02/1970  . Smokeless tobacco: Never Used     Comment: "smoked very little"  . Alcohol Use: No  . Drug Use: No  . Sexual Activity: Not Asked   Other Topics Concern  . None   Social History Narrative   Work or School: none      Home Situation: lives alone, daughters live on street ([address])      Spiritual Beliefs: [employer]      Lifestyle: no regular exercise, diet is ok           Current outpatient prescriptions:  .  aspirin 81 MG tablet, Take 162 mg by mouth daily. , Disp: , Rfl:  .  atenolol (TENORMIN) 50 MG tablet, TAKE 1 TABLET  DAILY, Disp: 90 tablet, Rfl: 1 .  cholecalciferol (VITAMIN D) 1000 units tablet, Take 1,000 Units by mouth daily., Disp: , Rfl:  .  citalopram (CELEXA) 20 MG tablet, Take 1 tablet (20 mg total) by mouth daily., Disp: 90 tablet, Rfl: 3 .  lisinopril (PRINIVIL,ZESTRIL) 20 MG tablet, Take 1 tablet (20 mg total) by mouth daily., Disp: 90 tablet, Rfl: 3 .  NEXIUM 20 MG capsule, TAKE 1 CAPSULE DAILY AT 12 NOON, Disp: 90 capsule, Rfl: 1 .  pravastatin (PRAVACHOL) 20 MG tablet, Take 1 tablet (20 mg total) by mouth daily., Disp: 90 tablet, Rfl: 3 .  amLODipine (NORVASC) 2.5 MG tablet, Take 1 tablet (2.5 mg total) by mouth daily., Disp: 90 tablet, Rfl: 3 .  fluticasone (FLONASE) 50 MCG/ACT nasal spray, Place 1 spray into both nostrils daily.,  Disp: 16 g, Rfl: 2  EXAM:  Filed Vitals:   05/08/15 1301  BP: 142/70  Pulse: 64  Temp: 98.4 F (36.9 C)    Body mass index is 24.73 kg/(m^2).  GENERAL: vitals reviewed and listed above, alert, oriented, appears well hydrated and in no acute distress  HEENT: atraumatic, conjunttiva clear, PERRLA, visual acuity grossly intact, no obvious abnormalities on inspection of external nose and ears, normal appearance of ear canals and TMs, clear ear effusion bilaterally, clear nasal congestion, mild post oropharyngeal erythema with PND, no tonsillar edema or exudate, no sinus TTP  NECK: no obvious masses on inspection  LUNGS: clear to auscultation bilaterally, no wheezes, rales or rhonchi, good air movement  CV: HRRR, no peripheral edema  MS: moves all extremities without noticeable abnormality  PSYCH: pleasant and cooperative, no obvious depression or anxiety  ASSESSMENT AND PLAN:  Discussed the following assessment and plan:  Essential hypertension, benign  Nonintractable headache, unspecified chronicity pattern, unspecified headache type  Anemia, unspecified anemia type  - discussed options for blood pressure treatment and opted to start low-dose Norvasc daughter is quite concerned with the elevated blood pressure readings at home. Initial blood pressure was elevated at 160 here, improved on recheck. If continues to run high may increase the dose of lisinopril. Follow-up in 2 weeks to recheck and daughter will monitor at home and bring cuff to next appointment.  - Discussed the anemia, may opted to not do further evaluation at this time due to her age, but to add back iron therapy. - Trial treating her sinus issues which are likely allergy related and over-the-counter conservative treatments for the headache. If she has persistent headache she may follow-up with her neurologist about this. -Patient advised to return or notify a doctor immediately if symptoms worsen or persist or new  concerns arise.  Patient Instructions  Before you leave: -Schedule follow up in 2 weeks  Continue your current medications.  Start the Norvasc 2.5 mg once daily for your blood pressure.  Start Flonase 2 sprays each nostril daily for the sinus issues and the fluid in the ears.  You can use Tylenol [phone] mg up to 3 times per day and topical menthol products (such as tiger balm) for the headaches.  Please start an over-the-counter iron supplement once daily for the anemia. If you are having any bleeding or if he would like to do a further evaluation for this please let us know and we will place a referral.  Check blood pressure 3 times per week and keep a log. Please bring her log and the blood pressure cuff to next appointment.  Please let us know if you  have any questions or concerns prior to your scheduled follow-up.     Kriste Basque R.

## 2015-05-08 NOTE — Patient Instructions (Signed)
Before you leave: -Schedule follow up in 2 weeks  Continue your current medications.  Start the Norvasc 2.5 mg once daily for your blood pressure.  Start Flonase 2 sprays each nostril daily for the sinus issues and the fluid in the ears.  You can use Tylenol [phone] mg up to 3 times per day and topical menthol products (such as tiger balm) for the headaches.  Please start an over-the-counter iron supplement once daily for the anemia. If you are having any bleeding or if he would like to do a further evaluation for this please let us know and we will place a referral.  Check blood pressure 3 times per week and keep a log. Please bring her log and the blood pressure cuff to next appointment.  Please let us know if you have any questions or concerns prior to your scheduled follow-up.

## 2015-05-08 NOTE — Progress Notes (Signed)
Pre visit review using our clinic review tool, if applicable. No additional management support is needed unless otherwise documented below in the visit note. 

## 2015-05-15 ENCOUNTER — Encounter: Payer: Self-pay | Admitting: Family Medicine

## 2015-05-15 ENCOUNTER — Ambulatory Visit (INDEPENDENT_AMBULATORY_CARE_PROVIDER_SITE_OTHER): Payer: Medicare Other | Admitting: Family Medicine

## 2015-05-15 VITALS — BP 180/70 | HR 53 | Temp 97.9°F | Ht 68.0 in | Wt 160.8 lb

## 2015-05-15 DIAGNOSIS — I10 Essential (primary) hypertension: Secondary | ICD-10-CM | POA: Diagnosis not present

## 2015-05-15 DIAGNOSIS — R519 Headache, unspecified: Secondary | ICD-10-CM

## 2015-05-15 DIAGNOSIS — H6980 Other specified disorders of Eustachian tube, unspecified ear: Secondary | ICD-10-CM

## 2015-05-15 DIAGNOSIS — J309 Allergic rhinitis, unspecified: Secondary | ICD-10-CM

## 2015-05-15 DIAGNOSIS — R51 Headache: Secondary | ICD-10-CM | POA: Diagnosis not present

## 2015-05-15 DIAGNOSIS — H6990 Unspecified Eustachian tube disorder, unspecified ear: Secondary | ICD-10-CM

## 2015-05-15 MED ORDER — PREDNISONE 10 MG PO TABS: ORAL_TABLET | ORAL | Status: DC

## 2015-05-15 MED ORDER — LISINOPRIL 30 MG PO TABS: 30.0000 mg | ORAL_TABLET | Freq: Every day | ORAL | Status: DC

## 2015-05-15 NOTE — Patient Instructions (Addendum)
  Schedule follow-up in 1 month. Cancel any other appointments before then.  Increase the lisinopril to 30 mg daily. I sent a new prescription to your pharmacy. Continue your norvasc 2.5 mg daily.  Take the prednisone as instructed.  Monitor blood pressures once daily after sitting for 5 minutes with the virus on the floor and your arm resting on a table.  Please call in 1 week to let us know how you are doing. We will plan to have you see your neurologist if you have persistent headaches.

## 2015-05-15 NOTE — Progress Notes (Signed)
Pre visit review using our clinic review tool, if applicable. No additional management support is needed unless otherwise documented below in the visit note. 

## 2015-05-15 NOTE — Progress Notes (Signed)
HPI:  Tracey Rosario pleasant 82 year old here for a follow-up visit regarding her blood pressure, nasal congestion and headache. He continues to have nasal congestion, postnasal drip dry cough along with daily headaches in the right periorbital, frontal and temporal region. The headaches are relieved with over-the-counter analgesics. She continues to have elevated blood pressure on her cuff in the range of 130s over 70s on the top, 60-70s on the bottom. No CP, SOB, DOE, fevers, thick white or yellow sinus congestion or drainage, sinus pain worse with leaning forward, nausea, vomiting, photophobia, vision changes, weakness or numbness. She has been taking the Flonase and has started the Norvasc for the last week.  ROS: See pertinent positives and negatives per HPI.  Past Medical History  Diagnosis Date  . Hypertension   . Palpitations 05/04/2013    saw cardiologist in 2015, he deleted A. Fib from the record as per note not documentation to support  . GERD (gastroesophageal reflux disease) 05/04/2013  . Depression 10/07/2013  . Hyperlipemia   . Anemia   . Cognitive decline     referred to neurologist  . Hip fx (HCC)     r hip, bilat pubis rami    Past Surgical History  Procedure Laterality Date  . Abdominal hysterectomy  1971  . Bladder surgery  2010  . Broken pelvis  2015  . Hip pinning,cannulated Right 10/07/2013    Procedure: CANNULATED screws right hip;  Surgeon: Kathryne Hitchhristopher Y Blackman, MD;  [facility];  Service: Orthopedics;  Laterality: Right;    Family History  Problem Relation Age of Onset  . Prostate cancer Father   . Cancer Brother   . Cancer Sister   . Heart attack Sister   . Cancer Sister     Social History   Social History  . Marital Status: Widowed    Spouse Name: N/A  . Number of Children: 2  . Years of Education: N/A   Occupational History  . Retired    Social History Main Topics  . Smoking status: Former Smoker    Types: Cigarettes    Quit date:  08/02/1970  . Smokeless tobacco: Never Used     Comment: "smoked very little"  . Alcohol Use: No  . Drug Use: No  . Sexual Activity: Not Asked   Other Topics Concern  . None   Social History Narrative   Work or School: none      Home Situation: lives alone, daughters live on street (susan cooke)      Spiritual Beliefs: [employer]      Lifestyle: no regular exercise, diet is ok           Current outpatient prescriptions:  .  amLODipine (NORVASC) 2.5 MG tablet, Take 1 tablet (2.5 mg total) by mouth daily., Disp: 90 tablet, Rfl: 3 .  aspirin 81 MG tablet, Take 162 mg by mouth daily. , Disp: , Rfl:  .  atenolol (TENORMIN) 50 MG tablet, TAKE 1 TABLET DAILY, Disp: 90 tablet, Rfl: 1 .  cholecalciferol (VITAMIN D) 1000 units tablet, Take 1,000 Units by mouth daily., Disp: , Rfl:  .  citalopram (CELEXA) 20 MG tablet, Take 1 tablet (20 mg total) by mouth daily., Disp: 90 tablet, Rfl: 3 .  fluticasone (FLONASE) 50 MCG/ACT nasal spray, Place 1 spray into both nostrils daily., Disp: 16 g, Rfl: 2 .  lisinopril (PRINIVIL,ZESTRIL) 30 MG tablet, Take 1 tablet (30 mg total) by mouth daily., Disp: 30 tablet, Rfl: 3 .  NEXIUM 20 MG  capsule, TAKE 1 CAPSULE DAILY AT 12 NOON, Disp: 90 capsule, Rfl: 1 .  pravastatin (PRAVACHOL) 20 MG tablet, Take 1 tablet (20 mg total) by mouth daily., Disp: 90 tablet, Rfl: 3 .  predniSONE (DELTASONE) 10 MG tablet,  (3 tabs) for 2 days, then (2tabs) for 2 days, then  1 tab for 3 days, Disp: 13 tablet, Rfl: 0  EXAM:  Filed Vitals:   05/15/15 1440 05/15/15 1447  BP: 142/64 180/70  Pulse: 53   Temp: 97.9 F (36.6 C)     Body mass index is 24.46 kg/(m^2).  GENERAL: vitals reviewed and listed above, alert, oriented, appears well hydrated and in no acute distress  HEENT: atraumatic, conjunttiva clear, PERRLA, no facial asymmetry,  no obvious abnormalities on inspection of external nose and ears, normal appearance of ear canals and TMs with clear effusion  on the left, clear nasal congestion, mild post oropharyngeal erythema with PND, no tonsillar edema or exudate, no sinus TTP, no temporal artery tenderness to palpation or bruit   NECK: no obvious masses on inspection, no meningeal signs, no bruit   LUNGS: clear to auscultation bilaterally, no wheezes, rales or rhonchi, good air movement  CV: HRRR, no peripheral edema  MS: moves all extremities without noticeable abnormality  PSYCH/NEURO: pleasant and cooperative, somewhat flat affect, speech and thought processing grossly intact, cranial nerves II through XII grossly intact, gait is normal   ASSESSMENT AND PLAN:  Discussed the following assessment and plan:  Nonintractable headache, unspecified chronicity pattern, unspecified headache type  Allergic rhinitis, unspecified allergic rhinitis type  ETD (eustachian tube dysfunction), unspecified laterality  Essential hypertension, benign  Her blood pressure ranged here from 132-180 over 60s and 70s/her cuff calibrated was running higher than our readings by about 10 points on the top   She states she still has findings on exam to suggest uncontrolled allergies, I do not think she has a bacterial sinus infection given findings.  We opted to do a short course of steroid taper and increase her lisinopril. If symptoms are not improving she is to call and we will have her see her neurologist about her headaches.  -Patient advised to return or notify a doctor immediately if symptoms worsen or persist or new concerns arise.  Patient Instructions   Schedule follow-up in 1 month. Cancel any other appointments before then.  Increase the lisinopril to 30 mg daily. I sent a new prescription to your pharmacy. Continue your norvasc 2.5 mg daily.  Take the prednisone as instructed.  Monitor blood pressures once daily after sitting for 5 minutes with the virus on the floor and your arm resting on a table.  Please call in 1 week to let us know how  you are doing. We will plan to have you see your neurologist if you have persistent headaches.       Kriste Basque R.

## 2015-05-21 ENCOUNTER — Ambulatory Visit: Payer: Medicare Other | Admitting: Family Medicine

## 2015-06-11 ENCOUNTER — Encounter: Payer: Self-pay | Admitting: Family Medicine

## 2015-06-11 ENCOUNTER — Ambulatory Visit (INDEPENDENT_AMBULATORY_CARE_PROVIDER_SITE_OTHER): Payer: Medicare Other | Admitting: Family Medicine

## 2015-06-11 VITALS — BP 108/50 | HR 55 | Temp 98.1°F | Ht 68.0 in | Wt 159.3 lb

## 2015-06-11 DIAGNOSIS — R51 Headache: Secondary | ICD-10-CM

## 2015-06-11 DIAGNOSIS — I10 Essential (primary) hypertension: Secondary | ICD-10-CM

## 2015-06-11 DIAGNOSIS — K219 Gastro-esophageal reflux disease without esophagitis: Secondary | ICD-10-CM

## 2015-06-11 DIAGNOSIS — R519 Headache, unspecified: Secondary | ICD-10-CM

## 2015-06-11 MED ORDER — FLUTICASONE PROPIONATE 50 MCG/ACT NA SUSP: 1.0000 | Freq: Every day | NASAL | Status: DC

## 2015-06-11 MED ORDER — AMLODIPINE BESYLATE 2.5 MG PO TABS: 2.5000 mg | ORAL_TABLET | Freq: Every day | ORAL | Status: DC

## 2015-06-11 MED ORDER — OMEPRAZOLE 20 MG PO CPDR: 20.0000 mg | DELAYED_RELEASE_CAPSULE | Freq: Every day | ORAL | Status: DC

## 2015-06-11 NOTE — Patient Instructions (Signed)
Please schedule Medicare wellness visit in 3 months  Continue current medications, I sent the refills requested to your pharmacy   I sent the omeprazole that you requested to your mail pharmacy. Please take this instead of your Nexium once he get it.  We recommend the following healthy lifestyle measures: - eat a healthy whole foods diet consisting of regular small meals composed of vegetables, fruits, beans, nuts, seeds, healthy meats such as white chicken and fish and whole grains.  - avoid sweets, white starchy foods, fried foods, fast food, processed foods, sodas, red meet and other fattening foods.  - get a least 150-300 minutes of aerobic exercise per week.

## 2015-06-11 NOTE — Progress Notes (Signed)
Pre visit review using our clinic review tool, if applicable. No additional management support is needed unless otherwise documented below in the visit note. 

## 2015-06-11 NOTE — Progress Notes (Signed)
HPI:  Tracey Rosario is a sweet 82 year old here for a follow-up visit regarding her allergies, headaches and hypertension. At her last visit we did a short course of a steroid taper, started a low dose of Norvasc and increased her lisinopril. She reports she is feeling completely better. Her sinus and headache symptoms have resolved. Her blood pressure has been running great at home per her daughter in the 120s over 78s. She denies chest pain, shortness of breath, any further headaches or sinus issues.  Her insurance is no longer going to play for her Nexium. She takes this for her acid reflux and reports when she has tried to stop in the past she has had bad symptoms. They will cover omeprazole and they need a new prescription for this.  ROS: See pertinent positives and negatives per HPI.  Past Medical History  Diagnosis Date  . Hypertension   . Palpitations 05/04/2013    saw cardiologist in 2015, he deleted A. Fib from the record as per note not documentation to support  . GERD (gastroesophageal reflux disease) 05/04/2013  . Depression 10/07/2013  . Hyperlipemia   . Anemia   . Cognitive decline     referred to neurologist  . Hip fx (HCC)     r hip, bilat pubis rami    Past Surgical History  Procedure Laterality Date  . Abdominal hysterectomy  1971  . Bladder surgery  2010  . Broken pelvis  2015  . Hip pinning,cannulated Right 10/07/2013    Procedure: CANNULATED screws right hip;  Surgeon: Kathryne Hitch, MD;  Location: [facility] OR;  Service: Orthopedics;  Laterality: Right;    Family History  Problem Relation Age of Onset  . Prostate cancer Father   . Cancer Brother   . Cancer Sister   . Heart attack Sister   . Cancer Sister     Social History   Social History  . Marital Status: Widowed    Spouse Name: N/A  . Number of Children: 2  . Years of Education: N/A   Occupational History  . Retired    Social History Main Topics  . Smoking status: Former Smoker    Types:  Cigarettes    Quit date: 08/02/1970  . Smokeless tobacco: Never Used     Comment: "smoked very little"  . Alcohol Use: No  . Drug Use: No  . Sexual Activity: Not Asked   Other Topics Concern  . None   Social History Narrative   Work or School: none      Home Situation: lives alone, daughters live on street (susan cooke)      Spiritual Beliefs: [employer]      Lifestyle: no regular exercise, diet is ok           Current outpatient prescriptions:  .  amLODipine (NORVASC) 2.5 MG tablet, Take 1 tablet (2.5 mg total) by mouth daily., Disp: 90 tablet, Rfl: 3 .  aspirin 81 MG tablet, Take 162 mg by mouth daily. , Disp: , Rfl:  .  atenolol (TENORMIN) 50 MG tablet, TAKE 1 TABLET DAILY, Disp: 90 tablet, Rfl: 1 .  cholecalciferol (VITAMIN D) 1000 units tablet, Take 1,000 Units by mouth daily., Disp: , Rfl:  .  citalopram (CELEXA) 20 MG tablet, Take 1 tablet (20 mg total) by mouth daily., Disp: 90 tablet, Rfl: 3 .  fluticasone (FLONASE) 50 MCG/ACT nasal spray, Place 1 spray into both nostrils daily., Disp: 16 g, Rfl: 2 .  lisinopril (PRINIVIL,ZESTRIL) 30 MG tablet, Take  1 tablet (30 mg total) by mouth daily., Disp: 30 tablet, Rfl: 3 .  pravastatin (PRAVACHOL) 20 MG tablet, Take 1 tablet (20 mg total) by mouth daily., Disp: 90 tablet, Rfl: 3 .  predniSONE (DELTASONE) 10 MG tablet, 30mg  (3 tabs) for 2 days, then 20mg (2tabs) for 2 days, then 10mg  1 tab for 3 days, Disp: 13 tablet, Rfl: 0 .  omeprazole (PRILOSEC) 20 MG capsule, Take 1 capsule (20 mg total) by mouth daily., Disp: 90 capsule, Rfl: 3  EXAM:  Filed Vitals:   06/11/15 1328  BP: 108/50  Pulse: 55  Temp: 98.1 F (36.7 C)    Body mass index is 24.23 kg/(m^2).  GENERAL: vitals reviewed and listed above, alert, oriented, appears well hydrated and in no acute distress  HEENT: atraumatic, conjunttiva clear, no obvious abnormalities on inspection of external nose and ears, ear canals and TMs normal, nasal mucosa normal, oropharynx  unremarkable  NECK: no obvious masses on inspection  LUNGS: clear to auscultation bilaterally, no wheezes, rales or rhonchi, good air movement  CV: HRRR, no peripheral edema  MS: moves all extremities without noticeable abnormality  PSYCH: pleasant and cooperative, no obvious depression or anxiety  ASSESSMENT AND PLAN:  Discussed the following assessment and plan:  Essential hypertension, benign  Gastroesophageal reflux disease, esophagitis presence not specified  Frequent headaches  -Continue current antihypertensives -Refilled medications and Flonase -Headaches resolved -Stop the Nexium, Rx for omeprazole sent, discussed trying another trial off, but she is nervous about doing so has had bad symptoms in the past -Medicare exam with labs in 3-4 months -Patient advised to return or notify a doctor immediately if symptoms worsen or persist or new concerns arise.  Patient Instructions  Please schedule Medicare wellness visit in 3 months  Continue current medications, I sent the refills requested to your pharmacy   I sent the omeprazole that you requested to your mail pharmacy. Please take this instead of your Nexium once he get it.  We recommend the following healthy lifestyle measures: - eat a healthy whole foods diet consisting of regular small meals composed of vegetables, fruits, beans, nuts, seeds, healthy meats such as white chicken and fish and whole grains.  - avoid sweets, white starchy foods, fried foods, fast food, processed foods, sodas, red meet and other fattening foods.  - get a least 150-300 minutes of aerobic exercise per week.       Kriste BasqueKIM, Paizleigh Wilds R.

## 2015-07-09 DIAGNOSIS — H04123 Dry eye syndrome of bilateral lacrimal glands: Secondary | ICD-10-CM | POA: Diagnosis not present

## 2015-07-09 DIAGNOSIS — R51 Headache: Secondary | ICD-10-CM | POA: Diagnosis not present

## 2015-07-22 NOTE — ED Provider Notes (Signed)
Medical screening examination/treatment/procedure(s) were conducted as a shared visit with non-physician practitioner(s) and myself.  I personally evaluated the patient during the encounter.  Pt has improved both in pain and with bp.  Agree with plan.   EKG Interpretation None       Jacalyn LefevreJulie Jerriah Ines, MD 07/22/15 [phone]

## 2015-08-27 ENCOUNTER — Ambulatory Visit: Payer: Medicare Other | Admitting: Family Medicine

## 2015-09-14 ENCOUNTER — Other Ambulatory Visit: Payer: Self-pay | Admitting: Family Medicine

## 2015-09-22 ENCOUNTER — Ambulatory Visit (INDEPENDENT_AMBULATORY_CARE_PROVIDER_SITE_OTHER): Payer: Medicare Other | Admitting: Family Medicine

## 2015-09-22 ENCOUNTER — Encounter: Payer: Self-pay | Admitting: Family Medicine

## 2015-09-22 VITALS — BP 98/60 | HR 55 | Temp 97.9°F | Ht 68.0 in | Wt 159.0 lb

## 2015-09-22 DIAGNOSIS — Z23 Encounter for immunization: Secondary | ICD-10-CM

## 2015-09-22 DIAGNOSIS — I10 Essential (primary) hypertension: Secondary | ICD-10-CM

## 2015-09-22 DIAGNOSIS — R944 Abnormal results of kidney function studies: Secondary | ICD-10-CM

## 2015-09-22 DIAGNOSIS — K219 Gastro-esophageal reflux disease without esophagitis: Secondary | ICD-10-CM | POA: Diagnosis not present

## 2015-09-22 DIAGNOSIS — E2839 Other primary ovarian failure: Secondary | ICD-10-CM

## 2015-09-22 DIAGNOSIS — F325 Major depressive disorder, single episode, in full remission: Secondary | ICD-10-CM | POA: Diagnosis not present

## 2015-09-22 DIAGNOSIS — D649 Anemia, unspecified: Secondary | ICD-10-CM | POA: Diagnosis not present

## 2015-09-22 DIAGNOSIS — E785 Hyperlipidemia, unspecified: Secondary | ICD-10-CM

## 2015-09-22 DIAGNOSIS — Z0000 Encounter for general adult medical examination without abnormal findings: Secondary | ICD-10-CM

## 2015-09-22 LAB — CBC WITH DIFFERENTIAL/PLATELET
BASOS PCT: 0.5 % (ref 0.0–3.0)
Basophils Absolute: 0 10*3/uL (ref 0.0–0.1)
EOS ABS: 0.2 10*3/uL (ref 0.0–0.7)
EOS PCT: 3 % (ref 0.0–5.0)
HEMATOCRIT: 34.6 % — AB (ref 36.0–46.0)
HEMOGLOBIN: 11.9 g/dL — AB (ref 12.0–15.0)
LYMPHS PCT: 23.9 % (ref 12.0–46.0)
Lymphs Abs: 1.2 10*3/uL (ref 0.7–4.0)
MCHC: 34.2 g/dL (ref 30.0–36.0)
MCV: 87.8 fl (ref 78.0–100.0)
Monocytes Absolute: 0.6 10*3/uL (ref 0.1–1.0)
Monocytes Relative: 11.8 % (ref 3.0–12.0)
NEUTROS ABS: 3.1 10*3/uL (ref 1.4–7.7)
Neutrophils Relative %: 60.8 % (ref 43.0–77.0)
PLATELETS: 211 10*3/uL (ref 150.0–400.0)
RBC: 3.94 Mil/uL (ref 3.87–5.11)
RDW: 14.3 % (ref 11.5–15.5)
WBC: 5.1 10*3/uL (ref 4.0–10.5)

## 2015-09-22 LAB — BASIC METABOLIC PANEL
BUN: 34 mg/dL — AB (ref 6–23)
CALCIUM: 9.5 mg/dL (ref 8.4–10.5)
CO2: 33 mEq/L — ABNORMAL HIGH (ref 19–32)
CREATININE: 1.55 mg/dL — AB (ref 0.40–1.20)
Chloride: 100 mEq/L (ref 96–112)
GFR: 33.58 mL/min — AB (ref >60.00)
GLUCOSE: 93 mg/dL (ref 70–99)
POTASSIUM: 4.4 meq/L (ref 3.5–5.1)
Sodium: 138 mEq/L (ref 135–145)

## 2015-09-22 LAB — LIPID PANEL
CHOLESTEROL: 198 mg/dL (ref 0–200)
HDL: 47.3 mg/dL (ref >39.00)
LDL Cholesterol: 131 mg/dL — ABNORMAL HIGH (ref 0–99)
NONHDL: 150.22
Total CHOL/HDL Ratio: 4
Triglycerides: 96 mg/dL (ref 0.0–149.0)
VLDL: 19.2 mg/dL (ref 0.0–40.0)

## 2015-09-22 NOTE — Progress Notes (Signed)
Medicare Annual Preventive Care Visit  (initial annual wellness or annual wellness exam)  Concerns and/or follow up today:  HTN: -meds: amlodipine 2.5, asa, atenolol 50, lisinopril 30 -denies: CP, SOB, HA, DOE, dizziness  HLD: -meds: pravastatin -stable  GERD: -bad reflux if stops PPI  -stable  MDD: -stable on celexa -denies depression, thoughts of self harm  Anemia: -chronic per daughter and on iron therapy in the past -declined further eval in 05/2015 and preferred to start iron instead -declines weakness, bleeding, dizziness  ROS: negative for report of fevers, unintentional weight loss, vision changes, vision loss, hearing loss or change, chest pain, sob, hemoptysis, melena, hematochezia, hematuria, genital discharge or lesions, falls, bleeding or bruising, loc, thoughts of suicide or self harm, memory loss  1.) Patient-completed health risk assessment  - completed and reviewed, see scanned documentation  2.) Review of Medical History: -PMH, PSH, Family History and current specialty and care providers reviewed and updated and listed below  - see scanned in document in chart and below  Past Medical History:  Diagnosis Date  . Anemia   . Cognitive decline    referred to neurologist  . Depression 10/07/2013  . GERD (gastroesophageal reflux disease) 05/04/2013  . Hip fx (HCC)    r hip, bilat pubis rami  . Hyperlipemia   . Hypertension   . Palpitations 05/04/2013   saw cardiologist in 2015, he deleted A. Fib from the record as per note not documentation to support    Past Surgical History:  Procedure Laterality Date  . ABDOMINAL HYSTERECTOMY  1971  . BLADDER SURGERY  2010  . Broken Pelvis  2015  . HIP PINNING,CANNULATED Right 10/07/2013   Procedure: CANNULATED screws right hip;  Surgeon: Kathryne Hitch, MD;  Location: [facility] OR;  Service: Orthopedics;  Laterality: Right;    Social History   Social History  . Marital status: Widowed    Spouse name: N/A  .  Number of children: 2  . Years of education: N/A   Occupational History  . Retired    Social History Main Topics  . Smoking status: Former Smoker    Types: Cigarettes    Quit date: 08/02/1970  . Smokeless tobacco: Never Used     Comment: "smoked very little"  . Alcohol use No  . Drug use: No  . Sexual activity: Not on file   Other Topics Concern  . Not on file   Social History Narrative   Work or School: none      Home Situation: lives alone, daughters live on street (susan cooke)      Spiritual Beliefs: [employer]      Lifestyle: no regular exercise, diet is ok          Family History  Problem Relation Age of Onset  . Prostate cancer Father   . Cancer Brother   . Cancer Sister   . Heart attack Sister   . Cancer Sister     Current Outpatient Prescriptions on File Prior to Visit  Medication Sig Dispense Refill  . amLODipine (NORVASC) 2.5 MG tablet Take 1 tablet (2.5 mg total) by mouth daily. 90 tablet 3  . aspirin 81 MG tablet Take 162 mg by mouth daily.     Marland Kitchen atenolol (TENORMIN) 50 MG tablet TAKE 1 TABLET DAILY 90 tablet 1  . cholecalciferol (VITAMIN D) 1000 units tablet Take 1,000 Units by mouth daily.    . citalopram (CELEXA) 20 MG tablet TAKE 1 TABLET DAILY 90 tablet 2  .  fluticasone (FLONASE) 50 MCG/ACT nasal spray Place 1 spray into both nostrils daily. 16 g 2  . lisinopril (PRINIVIL,ZESTRIL) 30 MG tablet Take 1 tablet (30 mg total) by mouth daily. 30 tablet 3  . omeprazole (PRILOSEC) 20 MG capsule Take 1 capsule (20 mg total) by mouth daily. 90 capsule 3  . pravastatin (PRAVACHOL) 20 MG tablet Take 1 tablet (20 mg total) by mouth daily. 90 tablet 3  . predniSONE (DELTASONE) 10 MG tablet 30mg  (3 tabs) for 2 days, then 20mg (2tabs) for 2 days, then 10mg  1 tab for 3 days 13 tablet 0   No current facility-administered medications on file prior to visit.      3.) Review of functional ability and level of safety:  Any difficulty hearing?  NO  History of  falling?  NO  Any trouble with IADLs - using a phone, using transportation, grocery shopping, preparing meals, doing housework, doing laundry, taking medications and managing money? NO  Advance Directives? NO  See summary of recommendations in Patient Instructions below.  4.) Physical Exam Vitals:   09/22/15 0741  BP: 98/60  Pulse: (!) 55  Temp: 97.9 F (36.6 C)   Estimated body mass index is 24.18 kg/m as calculated from the following:   Height as of this encounter: 5\' 8"  (1.727 m).   Weight as of this encounter: 159 lb (72.1 kg).  EKG (optional): deferred  General: alert, appear well hydrated and in no acute distress  HEENT: visual acuity grossly intact  CV: HRRR  Lungs: CTA bilaterally  Psych: pleasant and cooperative, no obvious depression or anxiety  Cognitive function grossly intact  See patient instructions for recommendations.  Education and counseling regarding the above review of health provided with a plan for the following: -see scanned patient completed form for further details -fall prevention strategies discussed  -healthy lifestyle discussed -importance and resources for completing advanced directives discussed -see patient instructions below for any other recommendations provided  4)The following written screening schedule of preventive measures were reviewed with assessment and plan made per below, orders and patient instructions:       Alcohol screening done     Obesity Screening and counseling done     STI screening (Hep C if born [birth year]) offered and per pt wishes     Tobacco Screening done done       Pneumococcal (PPSV23 -one dose after 64, one before if risk factors), influenza yearly and hepatitis B vaccines (if high risk - end stage renal disease, IV drugs, homosexual men, live in home for mentally retarded, hemophilia receiving factors) ASSESSMENT/PLAN: done today to complete      Screening mammograph (yearly if  >40) ASSESSMENT/PLAN: utd, normal 01/2014      Screening Pap smear/pelvic exam (q2 years) ASSESSMENT/PLAN: n/a, declined       Colorectal cancer screening (FOBT yearly or flex sig q4y or colonoscopy q10y or barium enema q4y) ASSESSMENT/PLAN: aged out of screening, n/a      Diabetes outpatient self-management training services ASSESSMENT/PLAN: utd or done      Bone mass measurements(covered q2y if indicated - estrogen def, osteoporosis, hyperparathyroid, vertebral abnormalities, osteoporosis or steroids) ASSESSMENT/PLAN: she wants to check bone density - order placed at [facility] per daughter request      Screening for glaucoma(q1y if high risk - diabetes, FH, AA and > 50 or hispanic and > 65) ASSESSMENT/PLAN: utd, sees optho on regular basis, see scanned paper      Medical nutritional therapy for individuals with diabetes or  renal disease ASSESSMENT/PLAN: see orders      Cardiovascular screening blood tests (lipids q5y) ASSESSMENT/PLAN: see orders and labs      Diabetes screening tests ASSESSMENT/PLAN: see orders and labs   7.) Summary: -risk factors and conditions per above assessment were discussed and treatment, recommendations and referrals were offered per documentation above and orders and patient instructions.  Medicare annual wellness visit, subsequent  Essential hypertension - Plan: Basic metabolic panel  Major depressive disorder with single episode, in full remission (HCC)  Gastroesophageal reflux disease, esophagitis presence not specified  Hyperlipemia - Plan: Lipid Panel  Anemia, unspecified anemia type - Plan: CBC with Differential/Platelets  Estrogen deficiency - Plan: DG Bone Density  Patient Instructions  BEFORE YOU LEAVE: -flu and pneumococcal vaccines -follow up: 3-4 months -labs -would she like to discuss advance directive with Marcelino DusterMichelle? Offered at all [facility].  Please see a lawyer and/or go to this website to help you with advanced  directives and designating a health care power of attorney so that your wishes will be followed should you become too ill to make your own medical decisions.  http://greene.[email or web address]://www.ncdhhs.gov/aging/direct.htm  -We placed a referral for you as discussed for the bone density test. It usually takes about 1-2 weeks to process and schedule this referral. If you have not heard from us regarding this appointment in 2 weeks please contact our office.  We have ordered labs or studies at this visit. It can take up to 1-2 weeks for results and processing. IF results require follow up or explanation, we will call you with instructions. Clinically stable results will be [facility]. If you have not heard from us or cannot find your results in [facility] in 2 weeks please contact our office at [phone].  If you are not yet signed up for [facility], please consider signing up.   We recommend the following healthy lifestyle for LIFE: 1) Small portions.   Tip: eat off of a salad plate instead of a dinner plate.  Tip: It is ok to feel hungry after a meal - that likely means you ate an appropriate portion.  Tip: if you need more or a snack choose fruits, veggies and/or a handful of nuts or seeds.  2) Eat a healthy clean diet.  * Tip: Avoid (less then 1 serving per week): processed foods, sweets, sweetened drinks, white starches (rice, flour, bread, potatoes, pasta, etc), red meat, fast foods, butter  *Tip: CHOOSE instead   * 5-9 servings per day of fresh or frozen fruits and vegetables (but not corn, potatoes, bananas, canned or dried fruit)   *nuts and seeds, beans   *olives and olive oil   *small portions of lean meats such as fish and white chicken    *small portions of whole grains  3)Get at least 150 minutes of sweaty aerobic exercise per week.  4)Reduce stress - consider counseling, meditation and relaxation to balance other aspects of your life.         Kriste BasqueKIM, Mikeisha Lemonds R., DO

## 2015-09-22 NOTE — Patient Instructions (Addendum)
BEFORE YOU LEAVE: -flu and pneumococcal vaccines -follow up: 3-4 months -labs -would she like to discuss advance directive with Marcelino DusterMichelle? Offered at all [facility].  Please see a lawyer and/or go to this website to help you with advanced directives and designating a health care power of attorney so that your wishes will be followed should you become too ill to make your own medical decisions.  http://[email or web address]://www.ncdhhs.gov/aging/direct.htm  -We placed a referral for you as discussed for the bone density test. It usually takes about 1-2 weeks to process and schedule this referral. If you have not heard from us regarding this appointment in 2 weeks please contact our office.  We have ordered labs or studies at this visit. It can take up to 1-2 weeks for results and processing. IF results require follow up or explanation, we will call you with instructions. Clinically stable results will be released to [facility]. If you have not heard from us or cannot find your results in [facility] in 2 weeks please contact our office at [phone].  If you are not yet signed up for [facility], please consider signing up.   We recommend the following healthy lifestyle for LIFE: 1) Small portions.   Tip: eat off of a salad plate instead of a dinner plate.  Tip: It is ok to feel hungry after a meal - that likely means you ate an appropriate portion.  Tip: if you need more or a snack choose fruits, veggies and/or a handful of nuts or seeds.  2) Eat a healthy clean diet.  * Tip: Avoid (less then 1 serving per week): processed foods, sweets, sweetened drinks, white starches (rice, flour, bread, potatoes, pasta, etc), red meat, fast foods, butter  *Tip: CHOOSE instead   * 5-9 servings per day of fresh or frozen fruits and vegetables (but not corn, potatoes, bananas, canned or dried fruit)   *nuts and seeds, beans   *olives and olive oil   *small portions of lean meats such as fish and white chicken     *small portions of whole grains  3)Get at least 150 minutes of sweaty aerobic exercise per week.  4)Reduce stress - consider counseling, meditation and relaxation to balance other aspects of your life.

## 2015-09-22 NOTE — Progress Notes (Signed)
Pre visit review using our clinic review tool, if applicable. No additional management support is needed unless otherwise documented below in the visit note. 

## 2015-09-22 NOTE — Addendum Note (Signed)
Addended by: Johnella MoloneyFUNDERBURK, Darwyn Ponzo A on: 09/22/2015 08:17 AM   Modules accepted: Orders

## 2015-09-24 NOTE — Addendum Note (Signed)
Addended by: Johnella MoloneyFUNDERBURK, JO A on: 09/24/2015 02:17 PM   Modules accepted: Orders

## 2015-09-28 ENCOUNTER — Telehealth: Payer: Self-pay | Admitting: Family Medicine

## 2015-09-28 NOTE — Telephone Encounter (Signed)
Pts daughter would like a call back due to the mother not remembering what was told to her from our office.

## 2015-09-28 NOTE — Telephone Encounter (Signed)
I called the pts daughter and informed her of the results and she scheduled a lab appt for 10/23.

## 2015-10-13 ENCOUNTER — Encounter: Payer: Self-pay | Admitting: Neurology

## 2015-10-13 ENCOUNTER — Ambulatory Visit (INDEPENDENT_AMBULATORY_CARE_PROVIDER_SITE_OTHER): Payer: Medicare Other | Admitting: Neurology

## 2015-10-13 VITALS — BP 116/62 | HR 51 | Temp 98.1°F | Ht 68.0 in | Wt 157.4 lb

## 2015-10-13 DIAGNOSIS — G3184 Mild cognitive impairment, so stated: Secondary | ICD-10-CM | POA: Diagnosis not present

## 2015-10-13 NOTE — Patient Instructions (Signed)
Continue to monitor your symptoms. If memory worsens, please call our office and we will plan to start Aricept. Control of blood pressure, cholesterol, as well as physical exercise and brain stimulation exercises are important for brain health. Follow-up in 1 year, call for any changes

## 2015-10-13 NOTE — Progress Notes (Signed)
NEUROLOGY FOLLOW UP OFFICE NOTE  Tracey Rosario [id number]  HISTORY OF PRESENT ILLNESS: I had the pleasure of seeing Tracey Rosario in follow-up in the neurology clinic on 10/13/2015.  The patient was last seen 6 months ago for worsening memory and is again accompanied by her daughter who helps supplement the history today. MMSE in April 2017 was 28/30. Records and images were personally reviewed where available. I personally reviewed MRI brain without contrast which did not show any acute changes. There was age-related atrophy and mild to moderate chronic microvascular disease. TSH and B12 normal. She reports memory is unchanged, she occasionally repeats herself. She denies getting lost driving. She lives alone and denies any missed bill payments or missed medications. No difficulties with ADLs. No personality changes or hallucinations. Tremor on left hand unchanged. She denies any headaches, dizziness, diplopia, dysarthria/dysphagia, incontinence, no falls.   HPI 04/07/1015: This is a pleasant 82 yo RH woman with a history of hypertension, depression,who presented for worsening memory. She reports memory is "not too good." She started noticing memory changes around 2 years ago. Her daughter only started noticing them 6 months ago when the patient reported these to her. She mostly forgets names, sometimes conversations or dates. She frequently misplaces things. She has occasional word-finding difficulties. She denies getting lost driving. She lives by herself and denies any missed medications or bill payments. Her daughter reports her house is "perfect," no difficulties with personal hygiene or ADLs. Her daughter has noticed she occasionally repeats herself. No personality changes. She tried Prevagen which they did not feel was helpful.  She has had tremors in her hands, left more than right, for many years. She mostly notices them when waking up in the morning. She has fallen twice in the past 2  years, missing a step and fracturing her pelvis and right hip. She has dry mouth, neck, shoulder, and back pain, as well as constipation. She denies any focal numbness/tingling/weakness. She denies any head injuries or alcohol use. No family history of dementia.   PAST MEDICAL HISTORY: Past Medical History:  Diagnosis Date  . Anemia   . Cognitive decline    referred to neurologist  . Depression 10/07/2013  . GERD (gastroesophageal reflux disease) 05/04/2013  . Hip fx (HCC)    r hip, bilat pubis rami  . Hyperlipemia   . Hypertension   . Palpitations 05/04/2013   saw cardiologist in 2015, he deleted A. Fib from the record as per note not documentation to support    MEDICATIONS: Current Outpatient Prescriptions on File Prior to Visit  Medication Sig Dispense Refill  . amLODipine (NORVASC) 2.5 MG tablet Take 1 tablet (2.5 mg total) by mouth daily. 90 tablet 3  . aspirin 81 MG tablet Take 162 mg by mouth daily.     Marland Kitchen. atenolol (TENORMIN) 50 MG tablet TAKE 1 TABLET DAILY 90 tablet 1  . cholecalciferol (VITAMIN D) 1000 units tablet Take 1,000 Units by mouth daily.    . citalopram (CELEXA) 20 MG tablet TAKE 1 TABLET DAILY 90 tablet 2  . fluticasone (FLONASE) 50 MCG/ACT nasal spray Place 1 spray into both nostrils daily. 16 g 2  . lisinopril (PRINIVIL,ZESTRIL) 30 MG tablet Take 1 tablet (30 mg total) by mouth daily. 30 tablet 3  . omeprazole (PRILOSEC) 20 MG capsule Take 1 capsule (20 mg total) by mouth daily. 90 capsule 3  . pravastatin (PRAVACHOL) 20 MG tablet Take 1 tablet (20 mg total) by mouth daily. 90 tablet 3  .  predniSONE (DELTASONE) 10 MG tablet 30mg  (3 tabs) for 2 days, then 20mg (2tabs) for 2 days, then 10mg  1 tab for 3 days 13 tablet 0   No current facility-administered medications on file prior to visit.     ALLERGIES: Allergies  Allergen Reactions  . Codeine Other (See Comments)    Makes her crazy    FAMILY HISTORY: Family History  Problem Relation Age of Onset  . Prostate  cancer Father   . Cancer Brother   . Cancer Sister   . Heart attack Sister   . Cancer Sister     SOCIAL HISTORY: Social History   Social History  . Marital status: Widowed    Spouse name: N/A  . Number of children: 2  . Years of education: N/A   Occupational History  . Retired    Social History Main Topics  . Smoking status: Former Smoker    Types: Cigarettes    Quit date: 08/02/1970  . Smokeless tobacco: Never Used     Comment: "smoked very little"  . Alcohol use No  . Drug use: No  . Sexual activity: Not on file   Other Topics Concern  . Not on file   Social History Narrative   Work or School: none      Home Situation: lives alone, daughters live on street (susan cooke)      Spiritual Beliefs: [employer]      Lifestyle: no regular exercise, diet is ok          REVIEW OF SYSTEMS: Constitutional: No fevers, chills, or sweats, no generalized fatigue, change in appetite Eyes: No visual changes, double vision, eye pain Ear, nose and throat: No hearing loss, ear pain, nasal congestion, sore throat Cardiovascular: No chest pain, palpitations Respiratory:  No shortness of breath at rest or with exertion, wheezes GastrointestinaI: No nausea, vomiting, diarrhea, abdominal pain, fecal incontinence Genitourinary:  No dysuria, urinary retention or frequency Musculoskeletal:  No neck pain, back pain Integumentary: No rash, pruritus, skin lesions Neurological: as above Psychiatric: No depression, insomnia, anxiety Endocrine: No palpitations, fatigue, diaphoresis, mood swings, change in appetite, change in weight, increased thirst Hematologic/Lymphatic:  No anemia, purpura, petechiae. Allergic/Immunologic: no itchy/runny eyes, nasal congestion, recent allergic reactions, rashes  PHYSICAL EXAM: Vitals:   10/13/15 1412  BP: 116/62  Pulse: (!) 51  Temp: 98.1 F (36.7 C)   General: No acute distress Head:  Normocephalic/atraumatic Neck: supple, no paraspinal  tenderness, full range of motion Heart:  Regular rate and rhythm Lungs:  Clear to auscultation bilaterally Back: No paraspinal tenderness Skin/Extremities: No rash, no edema Neurological exam: Awake, alert, oriented to time, person, place. No aphasia or dysarthria. Fund of knowledge appropriate. Remote memory intact. Attention and concentration intact. Montreal Cognitive Assessment  10/13/2015  Visuospatial/ Executive (0/5) 5  Naming (0/3) 2  Attention: Read list of digits (0/2) 2  Attention: Read list of letters (0/1) 1  Attention: Serial 7 subtraction starting at 100 (0/3) 1  Language: Repeat phrase (0/2) 2  Language : Fluency (0/1) 0  Abstraction (0/2) 2  Delayed Recall (0/5) 4  Orientation (0/6) 5  Total 24  Adjusted Score (based on education) 25  Cranial nerves:  CN I: not tested CN II: pupils equal, round and reactive to light, visual fields intact, fundi unremarkable. CN III, IV, VI:  full range of motion, no nystagmus, no ptosis CN V: intact to light touch CN VII: upper and lower face symmetric CN VIII: hearing intact to finger rub CN IX, X:  gag intact, uvula midline CN XI: sternocleidomastoid and trapezius muscles intact CN XII: tongue midline Bulk & Tone: normal, no cogwheeling, no fasciculations. Motor: 5/5 throughout with no pronator drift. Sensation: intact to light touch  Deep Tendon Reflexes: +2 throughout except for absent ankle jerks bilaterally, no ankle clonus Plantar responses: downgoing bilaterally Cerebellar: no incoordination on finger to nose testing Gait: narrow-based and steady, able to tandem walk adequately, fair arm swing Tremor: +left hand resting tremor, mild bilateral postural tremor. +tremor with writing using right hand Decreased fine finger movements on left hand. +postural instability (similar to prior)  IMPRESSION: This is a pleasant 82 yo RH woman with a history of hypertension, depression, who presented for worsening memory. Her MOCA  score today is 25/30, indicating mild cognitive impairment. She continues to live alone independently without any significant difficulties. MRI brain unremarkable. We discussed the option of starting cholinesterase inhibitors, side effects, and expectations from the medication. She would like to hold off for now. We again discussed the importance of control of vascular risk factors, physical exercise, and brain stimulation exercises for brain health. She will follow-up in 1 year and knows to call for any changes.   Thank you for allowing me to participate in her care.  Please do not hesitate to call for any questions or concerns.  The duration of this appointment visit was 25 minutes of face-to-face time with the patient.  Greater than 50% of this time was spent in counseling, explanation of diagnosis, planning of further management, and coordination of care.   Patrcia Dolly, M.D.   CC: Dr. Selena Batten

## 2015-10-15 ENCOUNTER — Encounter: Payer: Self-pay | Admitting: Neurology

## 2015-10-15 DIAGNOSIS — R413 Other amnesia: Secondary | ICD-10-CM | POA: Insufficient documentation

## 2015-10-15 DIAGNOSIS — G3184 Mild cognitive impairment, so stated: Secondary | ICD-10-CM | POA: Insufficient documentation

## 2015-10-21 ENCOUNTER — Other Ambulatory Visit: Payer: Self-pay | Admitting: Family Medicine

## 2015-10-26 ENCOUNTER — Other Ambulatory Visit (INDEPENDENT_AMBULATORY_CARE_PROVIDER_SITE_OTHER): Payer: Medicare Other

## 2015-10-26 DIAGNOSIS — R944 Abnormal results of kidney function studies: Secondary | ICD-10-CM | POA: Diagnosis not present

## 2015-10-26 LAB — BASIC METABOLIC PANEL
BUN: 23 mg/dL (ref 6–23)
CALCIUM: 9.8 mg/dL (ref 8.4–10.5)
CO2: 33 mEq/L — ABNORMAL HIGH (ref 19–32)
CREATININE: 1.36 mg/dL — AB (ref 0.40–1.20)
Chloride: 102 mEq/L (ref 96–112)
GFR: 39.05 mL/min — AB (ref >60.00)
Glucose, Bld: 62 mg/dL — ABNORMAL LOW (ref 70–99)
POTASSIUM: 4.6 meq/L (ref 3.5–5.1)
Sodium: 140 mEq/L (ref 135–145)

## 2015-11-11 ENCOUNTER — Encounter: Payer: Self-pay | Admitting: Family Medicine

## 2015-11-11 DIAGNOSIS — M810 Age-related osteoporosis without current pathological fracture: Secondary | ICD-10-CM | POA: Diagnosis not present

## 2015-11-11 DIAGNOSIS — Z1231 Encounter for screening mammogram for malignant neoplasm of breast: Secondary | ICD-10-CM | POA: Diagnosis not present

## 2015-11-11 DIAGNOSIS — Z803 Family history of malignant neoplasm of breast: Secondary | ICD-10-CM | POA: Diagnosis not present

## 2015-11-22 ENCOUNTER — Other Ambulatory Visit: Payer: Self-pay | Admitting: Family Medicine

## 2016-01-28 ENCOUNTER — Ambulatory Visit (INDEPENDENT_AMBULATORY_CARE_PROVIDER_SITE_OTHER): Payer: Medicare Other | Admitting: Family Medicine

## 2016-01-28 ENCOUNTER — Encounter: Payer: Self-pay | Admitting: Family Medicine

## 2016-01-28 VITALS — BP 120/60 | HR 60 | Temp 98.2°F | Ht 68.0 in | Wt 161.3 lb

## 2016-01-28 DIAGNOSIS — G3184 Mild cognitive impairment, so stated: Secondary | ICD-10-CM

## 2016-01-28 DIAGNOSIS — F325 Major depressive disorder, single episode, in full remission: Secondary | ICD-10-CM

## 2016-01-28 DIAGNOSIS — M818 Other osteoporosis without current pathological fracture: Secondary | ICD-10-CM | POA: Diagnosis not present

## 2016-01-28 DIAGNOSIS — I10 Essential (primary) hypertension: Secondary | ICD-10-CM | POA: Diagnosis not present

## 2016-01-28 DIAGNOSIS — E785 Hyperlipidemia, unspecified: Secondary | ICD-10-CM | POA: Diagnosis not present

## 2016-01-28 NOTE — Patient Instructions (Addendum)
BEFORE YOU LEAVE: -follow up: 3-4 months  Continue current medications.  Will plan on labs next time.  Happy new year!

## 2016-01-28 NOTE — Progress Notes (Signed)
HPI:  Tracey Rosario is a pleasant 83 yo F with a PMH significant for HTN, osteoporosis, SMD, HLD, GERD, mild cog impairment, MDD and anemia here for follow up.  She reports she is doing quite well and is without complaints. She is reluctant to start medication for the osteoporosis and for now prefers high quality Vit D3 and exercise.  ROS: See pertinent positives and negatives per HPI.  Past Medical History:  Diagnosis Date  . Anemia   . Cognitive decline    referred to neurologist  . Depression 10/07/2013  . GERD (gastroesophageal reflux disease) 05/04/2013  . Hip fx (HCC)    r hip, bilat pubis rami  . Hyperlipemia   . Hypertension   . Palpitations 05/04/2013   saw cardiologist in 2015, he deleted A. Fib from the record as per note not documentation to support    Past Surgical History:  Procedure Laterality Date  . ABDOMINAL HYSTERECTOMY  1971  . BLADDER SURGERY  2010  . Broken Pelvis  2015  . HIP PINNING,CANNULATED Right 10/07/2013   Procedure: CANNULATED screws right hip;  Surgeon: Kathryne Hitch, MD;  Location: [facility] OR;  Service: Orthopedics;  Laterality: Right;    Family History  Problem Relation Age of Onset  . Prostate cancer Father   . Cancer Brother   . Cancer Sister   . Heart attack Sister   . Cancer Sister     Social History   Social History  . Marital status: Widowed    Spouse name: N/A  . Number of children: 2  . Years of education: N/A   Occupational History  . Retired    Social History Main Topics  . Smoking status: Former Smoker    Types: Cigarettes    Quit date: 08/02/1970  . Smokeless tobacco: Never Used     Comment: "smoked very little"  . Alcohol use No  . Drug use: No  . Sexual activity: Not Asked   Other Topics Concern  . None   Social History Narrative   Work or School: none      Home Situation: lives alone, daughters live on street (susan cooke)      Spiritual Beliefs: [employer]      Lifestyle: no regular exercise, diet  is ok           Current Outpatient Prescriptions:  .  amLODipine (NORVASC) 2.5 MG tablet, Take 1 tablet (2.5 mg total) by mouth daily., Disp: 90 tablet, Rfl: 3 .  aspirin 81 MG tablet, Take 162 mg by mouth daily. , Disp: , Rfl:  .  atenolol (TENORMIN) 50 MG tablet, TAKE 1 TABLET DAILY, Disp: 90 tablet, Rfl: 1 .  cholecalciferol (VITAMIN D) 1000 units tablet, Take 1,000 Units by mouth daily., Disp: , Rfl:  .  citalopram (CELEXA) 20 MG tablet, TAKE 1 TABLET DAILY, Disp: 90 tablet, Rfl: 2 .  fluticasone (FLONASE) 50 MCG/ACT nasal spray, USE 1 SPRAY IN EACH NOSTRIL DAILY, Disp: 48 g, Rfl: 1 .  IRON PO, Take by mouth., Disp: , Rfl:  .  lisinopril (PRINIVIL,ZESTRIL) 30 MG tablet, Take 1 tablet (30 mg total) by mouth daily., Disp: 30 tablet, Rfl: 3 .  omeprazole (PRILOSEC) 20 MG capsule, Take 1 capsule (20 mg total) by mouth daily., Disp: 90 capsule, Rfl: 3 .  pravastatin (PRAVACHOL) 20 MG tablet, Take 1 tablet (20 mg total) by mouth daily., Disp: 90 tablet, Rfl: 3 .  predniSONE (DELTASONE) 10 MG tablet, 30mg  (3 tabs) for 2 days, then  20mg (2tabs) for 2 days, then 10mg  1 tab for 3 days, Disp: 13 tablet, Rfl: 0  EXAM:  Vitals:   01/28/16 1427  BP: 120/60  Pulse: 60  Temp: 98.2 F (36.8 C)    Body mass index is 24.53 kg/m.  GENERAL: vitals reviewed and listed above, alert, oriented, appears well hydrated and in no acute distress  HEENT: atraumatic, conjunttiva clear, no obvious abnormalities on inspection of external nose and ears  NECK: no obvious masses on inspection  LUNGS: clear to auscultation bilaterally, no wheezes, rales or rhonchi, good air movement  CV: HRRR, no peripheral edema  MS: moves all extremities without noticeable abnormality  PSYCH: pleasant and cooperative, no obvious depression or anxiety  ASSESSMENT AND PLAN:  Discussed the following assessment and plan:  Essential hypertension  Major depressive disorder with single episode, in full remission  (HCC)  Hyperlipidemia, unspecified hyperlipidemia type  Mild cognitive impairment  Other osteoporosis without current pathological fracture  -discussed options for osteoporosis, she opted for vit D3, advised of quality products per consumer labs report and wt bearing exercise for now -mood good -bp good -cont current meds -labs at follow up -Patient advised to return or notify a doctor immediately if symptoms worsen or persist or new concerns arise.  Patient Instructions  BEFORE YOU LEAVE: -follow up: 3-4 months  Continue current medications.  Will plan on labs next time.  Happy new year!   Kriste BasqueKIM, Tracey Ergle R., DO

## 2016-01-28 NOTE — Progress Notes (Signed)
Pre visit review using our clinic review tool, if applicable. No additional management support is needed unless otherwise documented below in the visit note. 

## 2016-03-22 DIAGNOSIS — Z08 Encounter for follow-up examination after completed treatment for malignant neoplasm: Secondary | ICD-10-CM | POA: Diagnosis not present

## 2016-03-22 DIAGNOSIS — Z85828 Personal history of other malignant neoplasm of skin: Secondary | ICD-10-CM | POA: Diagnosis not present

## 2016-03-22 DIAGNOSIS — L570 Actinic keratosis: Secondary | ICD-10-CM | POA: Diagnosis not present

## 2016-03-22 DIAGNOSIS — L821 Other seborrheic keratosis: Secondary | ICD-10-CM | POA: Diagnosis not present

## 2016-03-22 DIAGNOSIS — Z85820 Personal history of malignant melanoma of skin: Secondary | ICD-10-CM | POA: Diagnosis not present

## 2016-05-12 DIAGNOSIS — H35033 Hypertensive retinopathy, bilateral: Secondary | ICD-10-CM | POA: Diagnosis not present

## 2016-05-12 DIAGNOSIS — H35373 Puckering of macula, bilateral: Secondary | ICD-10-CM | POA: Diagnosis not present

## 2016-05-12 DIAGNOSIS — H35361 Drusen (degenerative) of macula, right eye: Secondary | ICD-10-CM | POA: Diagnosis not present

## 2016-05-12 DIAGNOSIS — Z961 Presence of intraocular lens: Secondary | ICD-10-CM | POA: Diagnosis not present

## 2016-05-12 DIAGNOSIS — H04123 Dry eye syndrome of bilateral lacrimal glands: Secondary | ICD-10-CM | POA: Diagnosis not present

## 2016-05-12 LAB — HM DIABETES EYE EXAM

## 2016-05-13 ENCOUNTER — Other Ambulatory Visit: Payer: Self-pay | Admitting: Family Medicine

## 2016-05-18 ENCOUNTER — Other Ambulatory Visit: Payer: Self-pay | Admitting: Family Medicine

## 2016-06-01 NOTE — Progress Notes (Signed)
HPI:  Tracey Rosario is a pleasant 83 y.o. here for follow up. Chronic medical problems summarized below were reviewed for changes and stability and were updated as needed below. These issues and their treatment remain stable for the most part. Reports she is doing well. Reports eating well and active. No complaints today. Denies CP, SOB, DOE, treatment intolerance or new symptoms.  Due for AWV in Sept, labs:BMP, CBC.  HTN: -meds: amlodipine 2.5, asa, atenolol 50mg , lisinopril 30  HLD: -meds: pravastatin  20  GERD: -meds: prilosec 20mg   Depression: -meds: celexa 20mg     ROS: See pertinent positives and negatives per HPI.  Past Medical History:  Diagnosis Date  . Anemia   . Cognitive decline    referred to neurologist  . Depression 10/07/2013  . GERD (gastroesophageal reflux disease) 05/04/2013  . Hip fx (HCC)    r hip, bilat pubis rami  . Hyperlipemia   . Hypertension   . Palpitations 05/04/2013   saw cardiologist in 2015, he deleted A. Fib from the record as per note not documentation to support    Past Surgical History:  Procedure Laterality Date  . ABDOMINAL HYSTERECTOMY  1971  . BLADDER SURGERY  2010  . Broken Pelvis  2015  . HIP PINNING,CANNULATED Right 10/07/2013   Procedure: CANNULATED screws right hip;  Surgeon: Kathryne Hitchhristopher Y Blackman, MD;  [facility];  Service: Orthopedics;  Laterality: Right;    Family History  Problem Relation Age of Onset  . Prostate cancer Father   . Cancer Brother   . Cancer Sister   . Heart attack Sister   . Cancer Sister     Social History   Social History  . Marital status: Widowed    Spouse name: N/A  . Number of children: 2  . Years of education: N/A   Occupational History  . Retired    Social History Main Topics  . Smoking status: Former Smoker    Types: Cigarettes    Quit date: 08/02/1970  . Smokeless tobacco: Never Used     Comment: "smoked very little"  . Alcohol use No  . Drug use: No  . Sexual  activity: Not Asked   Other Topics Concern  . None   Social History Narrative   Work or School: none      Home Situation: lives alone, daughters live on street (susan cooke)      Spiritual Beliefs: [employer]      Lifestyle: no regular exercise, diet is ok           Current Outpatient Prescriptions:  .  amLODipine (NORVASC) 2.5 MG tablet, Take 1 tablet (2.5 mg total) by mouth daily., Disp: 90 tablet, Rfl: 3 .  aspirin 81 MG tablet, Take 162 mg by mouth daily. , Disp: , Rfl:  .  atenolol (TENORMIN) 50 MG tablet, TAKE 1 TABLET DAILY, Disp: 90 tablet, Rfl: 1 .  cholecalciferol (VITAMIN D) 1000 units tablet, Take 1,000 Units by mouth daily., Disp: , Rfl:  .  citalopram (CELEXA) 20 MG tablet, TAKE 1 TABLET DAILY, Disp: 90 tablet, Rfl: 2 .  fluticasone (FLONASE) 50 MCG/ACT nasal spray, USE 1 SPRAY IN EACH NOSTRIL DAILY, Disp: 48 g, Rfl: 1 .  IRON PO, Take by mouth., Disp: , Rfl:  .  lisinopril (PRINIVIL,ZESTRIL) 30 MG tablet, Take 1 tablet (30 mg total) by mouth daily., Disp: 30 tablet, Rfl: 3 .  omeprazole (PRILOSEC) 20 MG capsule, TAKE 1 CAPSULE DAILY, Disp: 90 capsule, Rfl: 2 .  pravastatin (PRAVACHOL) 20 MG tablet, Take 1 tablet (20 mg total) by mouth daily., Disp: 90 tablet, Rfl: 3 .  predniSONE (DELTASONE) 10 MG tablet, 30mg  (3 tabs) for 2 days, then 20mg (2tabs) for 2 days, then 10mg  1 tab for 3 days, Disp: 13 tablet, Rfl: 0  EXAM:  Vitals:   06/02/16 1342  BP: 140/60  Pulse: (!) 51  Temp: 98.1 F (36.7 C)    Body mass index is 25.03 kg/m.  GENERAL: vitals reviewed and listed above, alert, oriented, appears well hydrated and in no acute distress  HEENT: atraumatic, conjunttiva clear, no obvious abnormalities on inspection of external nose and ears  NECK: no obvious masses on inspection  LUNGS: clear to auscultation bilaterally, no wheezes, rales or rhonchi, good air movement  CV: HRRR, no peripheral edema  MS: moves all extremities without noticeable  abnormality  PSYCH: pleasant and cooperative, no obvious depression or anxiety  ASSESSMENT AND PLAN:  Discussed the following assessment and plan:  Hypertension, unspecified type - Plan: Basic metabolic panel, CBC  Hyperlipidemia, unspecified hyperlipidemia type  Gastroesophageal reflux disease, esophagitis presence not specified  Major depressive disorder with single episode, in full remission (HCC)  -labs per orders -healthy lifestyle advised -continuation of current medications advised -AWV and follow up in 3-4 months -Patient advised to return or notify a doctor immediately if symptoms worsen or persist or new concerns arise.  Patient Instructions  BEFORE YOU LEAVE: -labs -follow up: Medicare exam with Darl Pikes and follow up with Dr. Selena Batten in Sept or Oct - same day  We have ordered labs or studies at this visit. It can take up to 1-2 weeks for results and processing. IF results require follow up or explanation, we will call you with instructions. Clinically stable results will be released to your [facility]. If you have not heard from [place] or cannot find your results in [facility] in 2 weeks please contact our office at [phone].  If you are not yet signed up for [facility], please consider signing up.  Advise regular aerobic exercise (at least 150 minutes per week of sweaty exercise) and a healthy diet. Try to eat at least 5-9 servings of vegetables and fruits per day (not corn, potatoes or bananas.) Avoid sweets, red meat, pork, butter, fried foods, fast food, processed food, excessive dairy, eggs and coconut. Replace bad fats with good fats - fish, nuts and seeds, canola oil, olive oil.            Kriste Basque R., DO

## 2016-06-02 ENCOUNTER — Encounter: Payer: Self-pay | Admitting: Family Medicine

## 2016-06-02 ENCOUNTER — Ambulatory Visit (INDEPENDENT_AMBULATORY_CARE_PROVIDER_SITE_OTHER): Payer: Medicare Other | Admitting: Family Medicine

## 2016-06-02 VITALS — BP 140/60 | HR 51 | Temp 98.1°F | Ht 68.0 in | Wt 164.6 lb

## 2016-06-02 DIAGNOSIS — K219 Gastro-esophageal reflux disease without esophagitis: Secondary | ICD-10-CM | POA: Diagnosis not present

## 2016-06-02 DIAGNOSIS — I10 Essential (primary) hypertension: Secondary | ICD-10-CM | POA: Diagnosis not present

## 2016-06-02 DIAGNOSIS — F325 Major depressive disorder, single episode, in full remission: Secondary | ICD-10-CM | POA: Diagnosis not present

## 2016-06-02 DIAGNOSIS — E785 Hyperlipidemia, unspecified: Secondary | ICD-10-CM | POA: Diagnosis not present

## 2016-06-02 LAB — BASIC METABOLIC PANEL
BUN: 25 mg/dL — AB (ref 6–23)
CALCIUM: 9.5 mg/dL (ref 8.4–10.5)
CO2: 30 meq/L (ref 19–32)
CREATININE: 1.16 mg/dL (ref 0.40–1.20)
Chloride: 105 mEq/L (ref 96–112)
GFR: 46.85 mL/min — ABNORMAL LOW (ref >60.00)
Glucose, Bld: 95 mg/dL (ref 70–99)
Potassium: 4.7 mEq/L (ref 3.5–5.1)
Sodium: 139 mEq/L (ref 135–145)

## 2016-06-02 LAB — CBC
HCT: 35.4 % — ABNORMAL LOW (ref 36.0–46.0)
Hemoglobin: 12 g/dL (ref 12.0–15.0)
MCHC: 33.9 g/dL (ref 30.0–36.0)
MCV: 87.2 fl (ref 78.0–100.0)
Platelets: 217 10*3/uL (ref 150.0–400.0)
RBC: 4.06 Mil/uL (ref 3.87–5.11)
RDW: 14.8 % (ref 11.5–15.5)
WBC: 4.9 10*3/uL (ref 4.0–10.5)

## 2016-06-02 NOTE — Patient Instructions (Signed)
BEFORE YOU LEAVE: -labs -follow up: Medicare exam with Tracey Rosario and follow up with Dr. Selena Rosario in Sept or Oct - same day  We have ordered labs or studies at this visit. It can take up to 1-2 weeks for results and processing. IF results require follow up or explanation, we will call you with instructions. Clinically stable results will be released to your [facility]. If you have not heard from us or cannot find your results in [facility] in 2 weeks please contact our office at [phone].  If you are not yet signed up for [facility], please consider signing up.  Advise regular aerobic exercise (at least 150 minutes per week of sweaty exercise) and a healthy diet. Try to eat at least 5-9 servings of vegetables and fruits per day (not corn, potatoes or bananas.) Avoid sweets, red meat, pork, butter, fried foods, fast food, processed food, excessive dairy, eggs and coconut. Replace bad fats with good fats - fish, nuts and seeds, canola oil, olive oil.

## 2016-06-13 ENCOUNTER — Other Ambulatory Visit: Payer: Self-pay | Admitting: Family Medicine

## 2016-08-19 ENCOUNTER — Other Ambulatory Visit: Payer: Self-pay | Admitting: Family Medicine

## 2016-10-12 ENCOUNTER — Ambulatory Visit: Payer: Medicare Other | Admitting: Neurology

## 2016-10-26 ENCOUNTER — Ambulatory Visit: Payer: Medicare Other

## 2016-10-27 ENCOUNTER — Ambulatory Visit (INDEPENDENT_AMBULATORY_CARE_PROVIDER_SITE_OTHER): Payer: Medicare Other | Admitting: Family Medicine

## 2016-10-27 ENCOUNTER — Encounter: Payer: Self-pay | Admitting: Family Medicine

## 2016-10-27 VITALS — BP 120/50 | HR 53 | Temp 98.4°F | Ht 68.0 in | Wt 164.1 lb

## 2016-10-27 DIAGNOSIS — N3946 Mixed incontinence: Secondary | ICD-10-CM

## 2016-10-27 DIAGNOSIS — F325 Major depressive disorder, single episode, in full remission: Secondary | ICD-10-CM | POA: Diagnosis not present

## 2016-10-27 DIAGNOSIS — Z23 Encounter for immunization: Secondary | ICD-10-CM | POA: Diagnosis not present

## 2016-10-27 DIAGNOSIS — E785 Hyperlipidemia, unspecified: Secondary | ICD-10-CM | POA: Diagnosis not present

## 2016-10-27 DIAGNOSIS — I10 Essential (primary) hypertension: Secondary | ICD-10-CM

## 2016-10-27 NOTE — Progress Notes (Signed)
HPI:  Tracey Rosario is a pleasant 83 y.o. here for follow up. Chronic medical problems summarized below were reviewed for changes and stability and were updated as needed below. These issues and their treatment remain stable for the most part. reports she is doing well for the most part she does have urinary incontinence which is long-standing. She had a surgery for this remotely. She wears incontinence underwear. She is nervous about taking any medicines for this treatment concerns with side effects. She does wake up wet at night and sometimes does not make it to the bathroom during the day. Denies CP, SOB, DOE, treatment intolerance or new symptoms. Due for flu shot, labs, AWV, Phq9  HTN: -meds: amlodipine 2.5, asa, atenolol 50mg , lisinopril 30  HLD: -meds: pravastatin  20  GERD: -meds: prilosec 20mg   Depression: -meds: celexa 20mg   ROS: See pertinent positives and negatives per HPI.  Past Medical History:  Diagnosis Date  . Anemia   . Cognitive decline    referred to neurologist  . Depression 10/07/2013  . GERD (gastroesophageal reflux disease) 05/04/2013  . Hip fx (HCC)    r hip, bilat pubis rami  . Hyperlipemia   . Hypertension   . Palpitations 05/04/2013   saw cardiologist in 2015, he deleted A. Fib from the record as per note not documentation to support    Past Surgical History:  Procedure Laterality Date  . ABDOMINAL HYSTERECTOMY  1971  . BLADDER SURGERY  2010  . Broken Pelvis  2015  . HIP PINNING,CANNULATED Right 10/07/2013   Procedure: CANNULATED screws right hip;  Surgeon: Kathryne Hitch, MD;  Location: [facility] OR;  Service: Orthopedics;  Laterality: Right;    Family History  Problem Relation Age of Onset  . Prostate cancer Father   . Cancer Brother   . Cancer Sister   . Heart attack Sister   . Cancer Sister     Social History   Social History  . Marital status: Widowed    Spouse name: N/A  . Number of children: 2  . Years of education: N/A    Occupational History  . Retired    Social History Main Topics  . Smoking status: Former Smoker    Types: Cigarettes    Quit date: 08/02/1970  . Smokeless tobacco: Never Used     Comment: "smoked very little"  . Alcohol use No  . Drug use: No  . Sexual activity: Not Asked   Other Topics Concern  . None   Social History Narrative   Work or School: none      Home Situation: lives alone, daughters live on street (susan cooke)      Spiritual Beliefs: [employer]      Lifestyle: no regular exercise, diet is ok           Current Outpatient Prescriptions:  .  amLODipine (NORVASC) 2.5 MG tablet, TAKE 1 TABLET DAILY, Disp: 90 tablet, Rfl: 1 .  aspirin 81 MG tablet, Take 162 mg by mouth daily. , Disp: , Rfl:  .  atenolol (TENORMIN) 50 MG tablet, TAKE 1 TABLET DAILY, Disp: 90 tablet, Rfl: 1 .  cholecalciferol (VITAMIN D) 1000 units tablet, Take 1,000 Units by mouth daily., Disp: , Rfl:  .  citalopram (CELEXA) 20 MG tablet, TAKE 1 TABLET DAILY, Disp: 90 tablet, Rfl: 1 .  fluticasone (FLONASE) 50 MCG/ACT nasal spray, USE 1 SPRAY IN EACH NOSTRIL DAILY, Disp: 48 g, Rfl: 1 .  IRON PO, Take by mouth., Disp: , Rfl:  .  lisinopril (PRINIVIL,ZESTRIL) 30 MG tablet, Take 1 tablet (30 mg total) by mouth daily., Disp: 30 tablet, Rfl: 3 .  omeprazole (PRILOSEC) 20 MG capsule, TAKE 1 CAPSULE DAILY, Disp: 90 capsule, Rfl: 2 .  pravastatin (PRAVACHOL) 20 MG tablet, Take 1 tablet (20 mg total) by mouth daily., Disp: 90 tablet, Rfl: 3 .  predniSONE (DELTASONE) 10 MG tablet, 30mg  (3 tabs) for 2 days, then 20mg (2tabs) for 2 days, then 10mg  1 tab for 3 days, Disp: 13 tablet, Rfl: 0  EXAM:  Vitals:   10/27/16 1302  BP: (!) 120/50  Pulse: (!) 53  Temp: 98.4 F (36.9 C)    Body mass index is 24.95 kg/m.  GENERAL: vitals reviewed and listed above, alert, oriented, appears well hydrated and in no acute distress  HEENT: atraumatic, conjunttiva clear, no obvious abnormalities on inspection of external  nose and ears  NECK: no obvious masses on inspection  LUNGS: clear to auscultation bilaterally, no wheezes, rales or rhonchi, good air movement  CV: HRRR, no peripheral edema  MS: moves all extremities without noticeable abnormality  PSYCH: pleasant and cooperative, no obvious depression or anxiety  ASSESSMENT AND PLAN:  Discussed the following assessment and plan:  Essential hypertension  Hyperlipidemia, unspecified hyperlipidemia type  Major depressive disorder with single episode, in full remission (HCC)  Mixed stress and urge urinary incontinence  -continue current medications, monitor blood pressure, currently feels well -Discussed various treatment options for her bladder incontinence, she may try the Kegel exercises, patient and daughter worried about side effects with medications -We'll plan to do her labs at her annual wellness visit, they do not have time to do this today and preferred to do this in a few months of follow-up -She does want to get her flu shot today -Patient advised to return or notify a doctor immediately if symptoms worsen or persist or new concerns arise.  Patient Instructions  BEFORE YOU LEAVE: -follow up: AWV with susan and follow up with Dr. Selena BattenKim in 2-3 months, will plan to do labs then -flu shot      Audrey Eller, Dahlia ClientHANNAH R., DO

## 2016-10-27 NOTE — Patient Instructions (Addendum)
BEFORE YOU LEAVE: -phq9 -follow up: AWV with susan and follow up with Dr. Selena BattenKim in 2-3 months, will plan to do labs then -flu shot

## 2016-10-27 NOTE — Addendum Note (Signed)
Addended by: Johnella MoloneyFUNDERBURK, JO A on: 10/27/2016 01:25 PM   Modules accepted: Orders

## 2016-12-02 DIAGNOSIS — Z1231 Encounter for screening mammogram for malignant neoplasm of breast: Secondary | ICD-10-CM | POA: Diagnosis not present

## 2016-12-02 LAB — HM MAMMOGRAPHY

## 2017-01-20 ENCOUNTER — Telehealth: Payer: Self-pay | Admitting: Family Medicine

## 2017-01-20 DIAGNOSIS — E785 Hyperlipidemia, unspecified: Secondary | ICD-10-CM

## 2017-01-20 MED ORDER — ATENOLOL 50 MG PO TABS: 50.0000 mg | ORAL_TABLET | Freq: Every day | ORAL | 1 refills | Status: DC

## 2017-01-20 MED ORDER — AMLODIPINE BESYLATE 2.5 MG PO TABS: 2.5000 mg | ORAL_TABLET | Freq: Every day | ORAL | 1 refills | Status: DC

## 2017-01-20 MED ORDER — OMEPRAZOLE 20 MG PO CPDR: 20.0000 mg | DELAYED_RELEASE_CAPSULE | Freq: Every day | ORAL | 2 refills | Status: DC

## 2017-01-20 MED ORDER — CITALOPRAM HYDROBROMIDE 20 MG PO TABS: 20.0000 mg | ORAL_TABLET | Freq: Every day | ORAL | 1 refills | Status: DC

## 2017-01-20 MED ORDER — LISINOPRIL 30 MG PO TABS: 30.0000 mg | ORAL_TABLET | Freq: Every day | ORAL | 1 refills | Status: DC

## 2017-01-20 NOTE — Telephone Encounter (Signed)
Patient called in for refills, all refills requested sent to [facility], last OV 10/27/16, next OV 02/02/17. Patient Would like a 2 week supply of atenolol sent [facility] on [address], patient is out

## 2017-01-20 NOTE — Telephone Encounter (Signed)
Copied from CRM [phone]. Topic: Quick Communication - Rx Refill/Question >> Jan 20, 2017  2:48 PM Oneal GroutSebastian, Jennifer S wrote: Medication: atenolol (TENORMIN) 50 MG tablet, amLODipine (NORVASC) 2.5 MG tablet,  citalopram (CELEXA) 20 MG tablet,  lisinopril (PRINIVIL,ZESTRIL) 30 MG tablet, omeprazole (PRILOSEC) 20 MG capsule, all 90 day supply   Has the patient contacted their pharmacy? Yes.     (Agent: If no, request that the patient contact the pharmacy for the refill.)   Preferred Pharmacy (with phone number or street name): [facility], Would like a 2 week supply of atenolol sent [facility] [address], patient is out   Agent: Please be advised that RX refills may take up to 3 business days. We ask that you follow-up with your pharmacy.

## 2017-01-23 ENCOUNTER — Telehealth: Payer: Self-pay | Admitting: Family Medicine

## 2017-01-23 MED ORDER — ATENOLOL 50 MG PO TABS: 50.0000 mg | ORAL_TABLET | Freq: Every day | ORAL | 0 refills | Status: DC

## 2017-01-23 NOTE — Telephone Encounter (Signed)
Copied from CRM #[id number]. Topic: General - Other °>> Jan 23, 2017  9:25 AM White, Selina wrote: °Reason for CRM: Patient daughter [name] would like a call when the 2 week supply of atenolol has been sent to [facility] on [address]. [name] can be reached @ [phone]. °

## 2017-01-23 NOTE — Telephone Encounter (Signed)
Refilled Atenolol for 14 day supply at local pharmacy, until mail order arrives, per daughter's request.  Notified [name], patient's daughter.

## 2017-01-23 NOTE — Telephone Encounter (Signed)
Copied from CRM [phone]. Topic: General - Other >> Jan 23, 2017  9:25 AM Viviann SpareWhite, Selina wrote: Reason for CRM: Patient daughter [name] would like a call when [facility] [address] [name] can be reached @ [phone].

## 2017-01-24 MED ORDER — PRAVASTATIN SODIUM 20 MG PO TABS: 20.0000 mg | ORAL_TABLET | Freq: Every day | ORAL | 3 refills | Status: DC

## 2017-01-24 NOTE — Addendum Note (Signed)
Addended by: Johnella MoloneyFUNDERBURK, Zanaiya Calabria A on: 01/24/2017 08:27 AM   Modules accepted: Orders

## 2017-01-24 NOTE — Telephone Encounter (Signed)
Rx for Pravastatin was sent to [facility] today and the others were sent on 1/18.

## 2017-01-31 NOTE — Progress Notes (Signed)
HPI:  Tracey Rosario is a pleasant 84 y.o. here for follow up. Chronic medical problems summarized below were reviewed for changes and stability and were updated as needed below. These issues and their treatment remain stable for the most part. No complaints today. Wonders if she can stop her lisinopril just because she wonders if she needs it. No issues with tolerance, dizziness, etc. Denies CP, SOB, DOE. Due for labs, mammo, ? Dexa, ? Advanced directives Offered AWV today but she prefers to return and do with Tracey Rosario. AWV 09/2015  HTN: -meds: amlodipine 2.5, asa, atenolol 50mg , lisinopril 30  HLD: -meds: pravastatin 20  GERD: -meds: prilosec 20mg   Depression: -meds: celexa 20mg   OOB/urinary incontinence: -Reports had surgery for this in the past and wears incontinence underwear -Chronic -Bradycardia medications for this   ROS: See pertinent positives and negatives per HPI.  Past Medical History:  Diagnosis Date  . Anemia   . Cognitive decline    referred to neurologist  . Depression 10/07/2013  . GERD (gastroesophageal reflux disease) 05/04/2013  . Hip fx (HCC)    r hip, bilat pubis rami  . Hyperlipemia   . Hypertension   . Palpitations 05/04/2013   saw cardiologist in 2015, he deleted A. Fib from the record as per note not documentation to support    Past Surgical History:  Procedure Laterality Date  . ABDOMINAL HYSTERECTOMY  1971  . BLADDER SURGERY  2010  . Broken Pelvis  2015  . HIP PINNING,CANNULATED Right 10/07/2013   Procedure: CANNULATED screws right hip;  Surgeon: Kathryne Hitchhristopher Y Blackman, MD;  [facility];  Service: Orthopedics;  Laterality: Right;    Family History  Problem Relation Age of Onset  . Prostate cancer Father   . Cancer Brother   . Cancer Sister   . Heart attack Sister   . Cancer Sister     Social History   Socioeconomic History  . Marital status: Widowed    Spouse name: None  . Number of children: 2  . Years of education:  None  . Highest education level: None  Social Needs  . Financial resource strain: None  . Food insecurity - worry: None  . Food insecurity - inability: None  . Transportation needs - medical: None  . Transportation needs - non-medical: None  Occupational History  . Occupation: Retired  Tobacco Use  . Smoking status: Former Smoker    Types: Cigarettes    Last attempt to quit: 08/02/1970    Years since quitting: 46.5  . Smokeless tobacco: Never Used  . Tobacco comment: "smoked very little"  Substance and Sexual Activity  . Alcohol use: No    Alcohol/week: 0.0 oz  . Drug use: No  . Sexual activity: None  Other Topics Concern  . None  Social History Narrative   Work or School: none      Home Situation: lives alone, daughters live on street (Tracey Rosario)      Spiritual Beliefs: [employer]      Lifestyle: no regular exercise, diet is ok        Current Outpatient Medications:  .  amLODipine (NORVASC) 2.5 MG tablet, Take 1 tablet (2.5 mg total) by mouth daily., Disp: 90 tablet, Rfl: 1 .  aspirin 81 MG tablet, Take 162 mg by mouth daily. , Disp: , Rfl:  .  atenolol (TENORMIN) 50 MG tablet, Take 1 tablet (50 mg total) by mouth daily., Disp: 90 tablet, Rfl: 1 .  cholecalciferol (VITAMIN D) 1000 units tablet,  Take 1,000 Units by mouth daily., Disp: , Rfl:  .  citalopram (CELEXA) 20 MG tablet, Take 1 tablet (20 mg total) by mouth daily., Disp: 90 tablet, Rfl: 1 .  fluticasone (FLONASE) 50 MCG/ACT nasal spray, USE 1 SPRAY IN EACH NOSTRIL DAILY, Disp: 48 g, Rfl: 1 .  IRON PO, Take by mouth., Disp: , Rfl:  .  lisinopril (PRINIVIL,ZESTRIL) 30 MG tablet, Take 1 tablet (30 mg total) by mouth daily., Disp: 90 tablet, Rfl: 1 .  omeprazole (PRILOSEC) 20 MG capsule, Take 1 capsule (20 mg total) by mouth daily., Disp: 90 capsule, Rfl: 2 .  pravastatin (PRAVACHOL) 20 MG tablet, Take 1 tablet (20 mg total) by mouth daily., Disp: 90 tablet, Rfl: 3 .  predniSONE (DELTASONE) 10 MG tablet, 30mg  (3 tabs)  for 2 days, then 20mg (2tabs) for 2 days, then 10mg  1 tab for 3 days, Disp: 13 tablet, Rfl: 0  EXAM:  Vitals:   02/02/17 1338  BP: (!) 138/58  Temp: 98.2 F (36.8 C)    Body mass index is 24.86 kg/m.  GENERAL: vitals reviewed and listed above, alert, oriented, appears well hydrated and in no acute distress  HEENT: atraumatic, conjunttiva clear, no obvious abnormalities on inspection of external nose and ears  NECK: no obvious masses on inspection  LUNGS: clear to auscultation bilaterally, no wheezes, rales or rhonchi, good air movement  CV: HRRR, no peripheral edema  MS: moves all extremities without noticeable abnormality  PSYCH: pleasant and cooperative, no obvious depression or anxiety  ASSESSMENT AND PLAN:  Discussed the following assessment and plan:  Essential hypertension - Plan: Basic metabolic panel, CBC  Hyperlipidemia, unspecified hyperlipidemia type - Plan: Lipid panel  Major depressive disorder with single episode, in full remission (HCC)  -continue current medications -she prefers to do AWV a different day with Tracey Rosario and prefers to do fasting labs then - orders placed for CBC, BMP, lipid panel -lifestyle recs, encouraged regualr aerobic activity and healthy diet -mood good -Patient advised to return or notify a doctor immediately if symptoms worsen or persist or new concerns arise.  Patient Instructions  BEFORE YOU LEAVE: -phq9 -follow up:  1) lab visit for fasting labs AND AWV with Tracey Rosario in next few weeks 2) follow up with Dr. Selena Batten in 4 months      [name], DO

## 2017-02-02 ENCOUNTER — Encounter: Payer: Self-pay | Admitting: Family Medicine

## 2017-02-02 ENCOUNTER — Ambulatory Visit (INDEPENDENT_AMBULATORY_CARE_PROVIDER_SITE_OTHER): Payer: Medicare Other | Admitting: Family Medicine

## 2017-02-02 VITALS — BP 138/58 | Temp 98.2°F | Ht 68.0 in | Wt 163.5 lb

## 2017-02-02 DIAGNOSIS — F325 Major depressive disorder, single episode, in full remission: Secondary | ICD-10-CM | POA: Diagnosis not present

## 2017-02-02 DIAGNOSIS — E785 Hyperlipidemia, unspecified: Secondary | ICD-10-CM | POA: Diagnosis not present

## 2017-02-02 DIAGNOSIS — I10 Essential (primary) hypertension: Secondary | ICD-10-CM

## 2017-02-02 NOTE — Patient Instructions (Addendum)
BEFORE YOU LEAVE: -phq9 -follow up:  1) lab visit for fasting labs AND AWV with [name] in next few weeks 2) follow up with Dr. Selena BattenKim in 4 months

## 2017-02-21 NOTE — Progress Notes (Addendum)
Subjective:   Tracey Rosario is a 84 y.o. female who presents for Medicare Annual (Subsequent) preventive examination.  Reports health as good   Darl Pikes her dtr lives near her and accompanied her today Lives alone; no failures at independent living to date One level home  No stairs Still drives - has some places  Does her  Laundry   Left shoulder  -hurts when your sitting up using it Getting worse instead of better  She thinks it is a muscle  Takes a tylenol at times   Diet BMI 24  Breakfast; cereal, banana, cup of coffee; buttered toast, fried apples Eggs and bacon Lunch; she doesn't eat lunch Afternoon ice cream  Supper cook vegetables and soup  Eats about 5pm    Exercise House keeping  Sittercize Walks the dog sometimes if the weather is good    Health Maintenance Due  Topic Date Due  . MAMMOGRAM  11/10/2016   Does mammogram every year   Dexa -2.5     Objective:     Vitals: BP (!) 110/50   Ht 5' 8.5" (1.74 m)   Wt 162 lb 4 oz (73.6 kg)   BMI 24.31 kg/m   Body mass index is 24.31 kg/m.  Advanced Directives 02/22/2017 05/05/2015 10/07/2013 05/07/2013  Does Patient Have a Medical Advance Directive? Yes No No Patient does not have advance directive  Would patient like information on creating a medical advance directive? - No - patient declined information No - patient declined information -  Pre-existing out of facility DNR order (yellow form or pink MOST form) - - - No   Dtr given [place] information and agreed to completed Advanced Directive; Reviewed advanced directive and agreed to receipt of information and discussion.  Focused face to face x  20 minutes discussing HCPOA and Living will and reviewed all the questions in the [facility] forms. The patient voices understanding of HCPOA; LW reviewed and information provided on each question. Educated on how to revoke this HCPOA or LW at any time.   Also  discussed life prolonging measures (given a few  examples) and where she could choose to initiate or not;  the ability to given the HCPOA power to change her living will or not if she cannot speak for herself; as well as finalizing the will by 2 unrelated witnesses and notary.  Will call for questions and given information on [facility] pastoral department for further assistance.       Tobacco Social History   Tobacco Use  Smoking Status Former Smoker  . Types: Cigarettes  . Last attempt to quit: 08/02/1970  . Years since quitting: 46.5  Smokeless Tobacco Never Used  Tobacco Comment   "smoked very little"     Counseling given: Yes Comment: "smoked very little"   Clinical Intake:     Past Medical History:  Diagnosis Date  . Anemia   . Cognitive decline    referred to neurologist  . Depression 10/07/2013  . GERD (gastroesophageal reflux disease) 05/04/2013  . Hip fx (HCC)    r hip, bilat pubis rami  . Hyperlipemia   . Hypertension   . Palpitations 05/04/2013   saw cardiologist in 2015, he deleted A. Fib from the record as per note not documentation to support   Past Surgical History:  Procedure Laterality Date  . ABDOMINAL HYSTERECTOMY  1971  . BLADDER SURGERY  2010  . Broken Pelvis  2015  . HIP PINNING,CANNULATED Right 10/07/2013  Procedure: CANNULATED screws right hip;  Surgeon: Kathryne Hitch, MD;  Location: [facility] OR;  Service: Orthopedics;  Laterality: Right;   Family History  Problem Relation Age of Onset  . Prostate cancer Father   . Cancer Brother   . Cancer Sister   . Heart attack Sister   . Cancer Sister    Social History   Socioeconomic History  . Marital status: Widowed    Spouse name: Not on file  . Number of children: 2  . Years of education: Not on file  . Highest education level: Not on file  Social Needs  . Financial resource strain: Not on file  . Food insecurity - worry: Not on file  . Food insecurity - inability: Not on file  . Transportation needs - medical: Not on file  .  Transportation needs - non-medical: Not on file  Occupational History  . Occupation: Retired  Tobacco Use  . Smoking status: Former Smoker    Types: Cigarettes    Last attempt to quit: 08/02/1970    Years since quitting: 46.5  . Smokeless tobacco: Never Used  . Tobacco comment: "smoked very little"  Substance and Sexual Activity  . Alcohol use: No    Alcohol/week: 0.0 oz  . Drug use: No  . Sexual activity: Not on file  Other Topics Concern  . Not on file  Social History Narrative   Work or School: none      Home Situation: lives alone, daughters live on street ([address] [place])      Spiritual Beliefs: [employer]      Lifestyle: no regular exercise, diet is ok      3 grand children and 2 great grands; 2 1/2 and 3 months        Outpatient Encounter Medications as of 02/22/2017  Medication Sig  . amLODipine (NORVASC) 2.5 MG tablet Take 1 tablet (2.5 mg total) by mouth daily.  Marland Kitchen aspirin 81 MG tablet Take 162 mg by mouth daily.   Marland Kitchen atenolol (TENORMIN) 50 MG tablet Take 1 tablet (50 mg total) by mouth daily.  . cholecalciferol (VITAMIN D) 1000 units tablet Take 1,000 Units by mouth daily.  . citalopram (CELEXA) 20 MG tablet Take 1 tablet (20 mg total) by mouth daily.  . fluticasone (FLONASE) 50 MCG/ACT nasal spray USE 1 SPRAY IN EACH NOSTRIL DAILY  . IRON PO Take by mouth.  Marland Kitchen lisinopril (PRINIVIL,ZESTRIL) 30 MG tablet Take 1 tablet (30 mg total) by mouth daily.  Marland Kitchen omeprazole (PRILOSEC) 20 MG capsule Take 1 capsule (20 mg total) by mouth daily.  . pravastatin (PRAVACHOL) 20 MG tablet Take 1 tablet (20 mg total) by mouth daily.  . [DISCONTINUED] predniSONE (DELTASONE) 10 MG tablet 30mg  (3 tabs) for 2 days, then 20mg (2tabs) for 2 days, then 10mg  1 tab for 3 days (Patient not taking: Reported on 02/22/2017)   No facility-administered encounter medications on file as of 02/22/2017.     Activities of Daily Living In your present state of health, do you have any difficulty performing the  following activities: 02/22/2017  Hearing? N  Vision? N  Some recent data might be hidden    Patient Care Team: [name], DO as PCP - General (Family Medicine)    Assessment:   This is a routine wellness examination for [facility].  Exercise Activities and Dietary recommendations    Goals    . Patient Stated     Drink more water Walk as much as you can; and use  bands for toning        Fall Risk Fall Risk  02/22/2017 10/13/2015 09/22/2015 05/08/2015  Falls in the past year? No No No No   Is the patient's home free of loose throw rugs in walkways, pet beds, electrical cords, etc? No       Grab bars in the bathroom  Timed Get Up and Go performed: within normal limits   Depression Screen PHQ 2/9 Scores 02/22/2017 02/02/2017 09/22/2015 05/08/2015  PHQ - 2 Score 0 0 0 0  PHQ- 9 Score - 2 - -     Cognitive Function MMSE - Mini Mental State Exam 02/22/2017 04/07/2015  Orientation to time 5 5  Orientation to Place 5 4  Registration 3 3  Attention/ Calculation 1 5  Recall 1 3  Language- name 2 objects 2 2  Language- repeat 1 1  Language- follow 3 step command 3 2  Language- read & follow direction 1 1  Write a sentence 1 1  Copy design 0 1  Total score 23 28   Montreal Cognitive Assessment  10/13/2015  Visuospatial/ Executive (0/5) 5  Naming (0/3) 2  Attention: Read list of digits (0/2) 2  Attention: Read list of letters (0/1) 1  Attention: Serial 7 subtraction starting at 100 (0/3) 1  Language: Repeat phrase (0/2) 2  Language : Fluency (0/1) 0  Abstraction (0/2) 2  Delayed Recall (0/5) 4  Orientation (0/6) 5  Total 24  Adjusted Score (based on education) 25   States she does have some recall issues Score on MMSE was 23  States she has never been able to do math. Failed picture. Could not spell work backwards. Completed clock with 2nd attempt; was a little confused but was able to figure it out.  No failures of independent living but may start seeing more issues  within at the next AWV. Has good support for now  Dressed appropriately; Oriented today      Immunization History  Administered Date(s) Administered  . Influenza, High Dose Seasonal PF 10/09/2014, 09/22/2015, 10/27/2016  . Influenza,inj,Quad PF,6+ Mos 10/10/2013  . Pneumococcal Conjugate-13 08/05/2014  . Pneumococcal Polysaccharide-23 09/22/2015  . Td 08/05/2014      Screening Tests Health Maintenance  Topic Date Due  . MAMMOGRAM  11/10/2016  . TETANUS/TDAP  08/04/2024  . INFLUENZA VACCINE  Completed  . PNA vac Low Risk Adult  Completed  . DEXA SCAN  Addressed        Plan:      PCP Notes   Health Maintenance Will make an apt with Dr .Selena Batten when you leave to fup on left shoulder pain   Darl Pikes will call [facility] and get your mammogram report  Mammogram confirmed 11/15/2016  Discussed osteoporosis via Dexa; given site for her and dtr to review the osteoporosis foundation for more information. Encouraged her to do chair exercises and walk, encouraged her  And dtr to check to be sure she is getting 1200 mg of calcium in her diet.   Educated regarding the shingrix  Educated regarding shingrix but may hold for nowl Never has had shingles  Abnormal Screens  MMSE down to 23  States she has never been able to do math. Failed picture. Could not spell work backwards. Completed clock with 2nd attempt; was a little confused but was able to figure it out.  No failures of independent living but may start seeing more issues within at the next AWV. Has good support for now  Dressed appropriately; Oriented  today    Referrals: none  Patient concerns; As noted below   Nurse Concerns; Left shoulder  -hurts when sitting up or when using it Getting worse instead of better;  She thinks it is a muscle  Takes a tylenol at times Difficult to differentiate the type of pain   To make apt with Dr. Selena BattenKim for fup when leaving today  Next PCP apt 03/09/2017     I have personally  reviewed and noted the following in the patient's chart:   . Medical and social history . Use of alcohol, tobacco or illicit drugs  . Current medications and supplements . Functional ability and status . Nutritional status . Physical activity . Advanced directives . List of other physicians . Hospitalizations, surgeries, and ER visits in previous 12 months . Vitals . Screenings to include cognitive, depression, and falls . Referrals and appointments  In addition, I have reviewed and discussed with patient certain preventive protocols, quality metrics, and best practice recommendations. A written personalized care plan for preventive services as well as general preventive health recommendations were provided to patient.     Esli Clements, RN  02/22/2017  Above notes reviewed in absence of primary provider.  Agree with assessment and plan as above.  Kristian CoveyBruce W Burchette MD [facility]

## 2017-02-22 ENCOUNTER — Other Ambulatory Visit (INDEPENDENT_AMBULATORY_CARE_PROVIDER_SITE_OTHER): Payer: Medicare Other

## 2017-02-22 ENCOUNTER — Ambulatory Visit (INDEPENDENT_AMBULATORY_CARE_PROVIDER_SITE_OTHER): Payer: Medicare Other

## 2017-02-22 VITALS — BP 110/50 | Ht 68.5 in | Wt 162.2 lb

## 2017-02-22 DIAGNOSIS — I10 Essential (primary) hypertension: Secondary | ICD-10-CM

## 2017-02-22 DIAGNOSIS — E785 Hyperlipidemia, unspecified: Secondary | ICD-10-CM

## 2017-02-22 DIAGNOSIS — Z0000 Encounter for general adult medical examination without abnormal findings: Secondary | ICD-10-CM | POA: Diagnosis not present

## 2017-02-22 LAB — BASIC METABOLIC PANEL
BUN: 25 mg/dL — ABNORMAL HIGH (ref 6–23)
CALCIUM: 9.8 mg/dL (ref 8.4–10.5)
CO2: 31 meq/L (ref 19–32)
Chloride: 104 mEq/L (ref 96–112)
Creatinine, Ser: 1.18 mg/dL (ref 0.40–1.20)
GFR: 45.86 mL/min — ABNORMAL LOW (ref >60.00)
GLUCOSE: 92 mg/dL (ref 70–99)
Potassium: 4.8 mEq/L (ref 3.5–5.1)
SODIUM: 141 meq/L (ref 135–145)

## 2017-02-22 LAB — LIPID PANEL
CHOLESTEROL: 192 mg/dL (ref 0–200)
HDL: 47.3 mg/dL (ref >39.00)
LDL Cholesterol: 128 mg/dL — ABNORMAL HIGH (ref 0–99)
NonHDL: 144.97
Total CHOL/HDL Ratio: 4
Triglycerides: 85 mg/dL (ref 0.0–149.0)
VLDL: 17 mg/dL (ref 0.0–40.0)

## 2017-02-22 LAB — CBC
HEMATOCRIT: 37.6 % (ref 36.0–46.0)
HEMOGLOBIN: 12.7 g/dL (ref 12.0–15.0)
MCHC: 33.8 g/dL (ref 30.0–36.0)
MCV: 86.1 fl (ref 78.0–100.0)
PLATELETS: 207 10*3/uL (ref 150.0–400.0)
RBC: 4.37 Mil/uL (ref 3.87–5.11)
RDW: 14.7 % (ref 11.5–15.5)
WBC: 4.1 10*3/uL (ref 4.0–10.5)

## 2017-02-22 NOTE — Patient Instructions (Addendum)
Tracey Rosario , Thank you for taking time to come for your Medicare Wellness Visit. I appreciate your ongoing commitment to your health goals. Please review the following plan we discussed and let me know if I can assist you in the future.   Will make an apt with Dr .Maudie Mercury when you leave to fup on left shoulder pain   Tracey Rosario will call [facility] and get your mammogram report   Recommendations for Dexa Scan Female over the age of 34 Man age 82 or older If you broke a bone past the age of 71 Women menopausal age with risk factors (thin frame; smoker; hx of fx ) Post menopausal women under the age of 30 with risk factors A man age 4 to 54 with risk factors Other: Spine xray that is showing break of bone loss Back pain with possible break Height loss of 1/2 inch or more within one year Total loss in height of 1.5 inches from your original height  Calcium '1200mg'$  with Vit D 800u per day; more as directed by physician Strength building exercises discussed; can include walking; housework; small weights or stretch bands; silver sneakers if access to the [facility]   Please visit the osteoporosis foundation.org for up to date recommendations  Shingrix is a vaccine for the prevention of Shingles in Adults 50 and older.  If you are on Medicare, you can request a prescription from your doctor to be filled at a pharmacy.  Please check with your benefits regarding applicable copays or out of pocket expenses.  The Shingrix is given in 2 vaccines approx 8 weeks apart. You must receive the 2nd dose prior to 6 months from receipt of the first.      These are the goals we discussed: Goals    . Patient Stated     Drink more water Walk as much as you can; and use bands for toning        This is a list of the screening recommended for you and due dates:  Health Maintenance  Topic Date Due  . Mammogram  11/10/2016  . Tetanus Vaccine  08/04/2024  . Flu Shot  Completed  . Pneumonia vaccines  Completed  .  DEXA scan (bone density measurement)  Addressed   Prevention of falls: Remove rugs or any tripping hazards in the home Use Non slip mats in bathtubs and showers Placing grab bars next to the toilet and or shower Placing handrails on both sides of the stair way Adding extra lighting in the home.   Personal safety issues reviewed:  1. Consider starting a community watch program per [facility] 2.  Changes batteries is smoke detector and/or carbon monoxide detector  3.  If you have firearms; keep them in a safe place 4.  Wear protection when in the sun; Always wear sunscreen or a hat; It is good to have your doctor check your skin annually or review any new areas of concern 5. Driving safety; Keep in the right lane; stay 3 car lengths behind the car in front of you on the highway; look 3 times prior to pulling out; carry your cell phone everywhere you go!    Learn about the Yellow Dot program:  The program allows first responders at your emergency to have access to who your physician is, as well as your medications and medical conditions.  Citizens requesting the Yellow Dot Packages should contact Master Corporal Nunzio Cobbs at the [facility] [phone] for the first  week of the program and beginning the week after Easter citizens should contact their Scientist, physiological.       Fall Prevention in the Home Falls can cause injuries. They can happen to people of all ages. There are many things you can do to make your home safe and to help prevent falls. What can I do on the outside of my home?  Regularly fix the edges of walkways and driveways and fix any cracks.  Remove anything that might make you trip as you walk through a door, such as a raised step or threshold.  Trim any bushes or trees on the path to your home.  Use bright outdoor lighting.  Clear any walking paths of anything that might make someone trip, such as rocks or  tools.  Regularly check to see if handrails are loose or broken. Make sure that both sides of any steps have handrails.  Any raised decks and porches should have guardrails on the edges.  Have any leaves, snow, or ice cleared regularly.  Use sand or salt on walking paths during winter.  Clean up any spills in your garage right away. This includes oil or grease spills. What can I do in the bathroom?  Use night lights.  Install grab bars by the toilet and in the tub and shower. Do not use towel bars as grab bars.  Use non-skid mats or decals in the tub or shower.  If you need to sit down in the shower, use a plastic, non-slip stool.  Keep the floor dry. Clean up any water that spills on the floor as soon as it happens.  Remove soap buildup in the tub or shower regularly.  Attach bath mats securely with double-sided non-slip rug tape.  Do not have throw rugs and other things on the floor that can make you trip. What can I do in the bedroom?  Use night lights.  Make sure that you have a light by your bed that is easy to reach.  Do not use any sheets or blankets that are too big for your bed. They should not hang down onto the floor.  Have a firm chair that has side arms. You can use this for support while you get dressed.  Do not have throw rugs and other things on the floor that can make you trip. What can I do in the kitchen?  Clean up any spills right away.  Avoid walking on wet floors.  Keep items that you use a lot in easy-to-reach places.  If you need to reach something above you, use a strong step stool that has a grab bar.  Keep electrical cords out of the way.  Do not use floor polish or wax that makes floors slippery. If you must use wax, use non-skid floor wax.  Do not have throw rugs and other things on the floor that can make you trip. What can I do with my stairs?  Do not leave any items on the stairs.  Make sure that there are handrails on both  sides of the stairs and use them. Fix handrails that are broken or loose. Make sure that handrails are as long as the stairways.  Check any carpeting to make sure that it is firmly attached to the stairs. Fix any carpet that is loose or worn.  Avoid having throw rugs at the top or bottom of the stairs. If you do have throw rugs, attach them to the floor with carpet  tape.  Make sure that you have a light switch at the top of the stairs and the bottom of the stairs. If you do not have them, ask someone to add them for you. What else can I do to help prevent falls?  Wear shoes that: ? Do not have high heels. ? Have rubber bottoms. ? Are comfortable and fit you well. ? Are closed at the toe. Do not wear sandals.  If you use a stepladder: ? Make sure that it is fully opened. Do not climb a closed stepladder. ? Make sure that both sides of the stepladder are locked into place. ? Ask someone to hold it for you, if possible.  Clearly mark and make sure that you can see: ? Any grab bars or handrails. ? First and last steps. ? Where the edge of each step is.  Use tools that help you move around (mobility aids) if they are needed. These include: ? Canes. ? Walkers. ? Scooters. ? Crutches.  Turn on the lights when you go into a dark area. Replace any light bulbs as soon as they burn out.  Set up your furniture so you have a clear path. Avoid moving your furniture around.  If any of your floors are uneven, fix them.  If there are any pets around you, be aware of where they are.  Review your medicines with your doctor. Some medicines can make you feel dizzy. This can increase your chance of falling. Ask your doctor what other things that you can do to help prevent falls. This information is not intended to replace advice given to you by your health care provider. Make sure you discuss any questions you have with your health care provider. Document Released: 10/16/2008 Document Revised:  05/28/2015 Document Reviewed: 01/24/2014 Elsevier Interactive Patient Education  2018 [place] Maintenance, Female Adopting a healthy lifestyle and getting preventive care can go a long way to promote health and wellness. Talk with your health care provider about what schedule of regular examinations is right for you. This is a good chance for you to check in with your provider about disease prevention and staying healthy. In between checkups, there are plenty of things you can do on your own. Experts have done a lot of research about which lifestyle changes and preventive measures are most likely to keep you healthy. Ask your health care provider for more information. Weight and diet Eat a healthy diet  Be sure to include plenty of vegetables, fruits, low-fat dairy products, and lean protein.  Do not eat a lot of foods high in solid fats, added sugars, or salt.  Get regular exercise. This is one of the most important things you can do for your health. ? Most adults should exercise for at least 150 minutes each week. The exercise should increase your heart rate and make you sweat (moderate-intensity exercise). ? Most adults should also do strengthening exercises at least twice a week. This is in addition to the moderate-intensity exercise.  Maintain a healthy weight  Body mass index (BMI) is a measurement that can be used to identify possible weight problems. It estimates body fat based on height and weight. Your health care provider can help determine your BMI and help you achieve or maintain a healthy weight.  For females 74 years of age and older: ? A BMI below 18.5 is considered underweight. ? A BMI of 18.5 to 24.9 is normal. ? A BMI of 25 to 29.9 is  considered overweight. ? A BMI of 30 and above is considered obese.  Watch levels of cholesterol and blood lipids  You should start having your blood tested for lipids and cholesterol at 82 years of age, then have this test  every 5 years.  You may need to have your cholesterol levels checked more often if: ? Your lipid or cholesterol levels are high. ? You are older than 82 years of age. ? You are at high risk for heart disease.  Cancer screening Lung Cancer  Lung cancer screening is recommended for adults 44-14 years old who are at high risk for lung cancer because of a history of smoking.  A yearly low-dose CT scan of the lungs is recommended for people who: ? Currently smoke. ? Have quit within the past 15 years. ? Have at least a 30-pack-year history of smoking. A pack year is smoking an average of one pack of cigarettes a day for 1 year.  Yearly screening should continue until it has been 15 years since you quit.  Yearly screening should stop if you develop a health problem that would prevent you from having lung cancer treatment.  Breast Cancer  Practice breast self-awareness. This means understanding how your breasts normally appear and feel.  It also means doing regular breast self-exams. Let your health care provider know about any changes, no matter how small.  If you are in your 20s or 30s, you should have a clinical breast exam (CBE) by a health care provider every 1-3 years as part of a regular health exam.  If you are 25 or older, have a CBE every year. Also consider having a breast X-ray (mammogram) every year.  If you have a family history of breast cancer, talk to your health care provider about genetic screening.  If you are at high risk for breast cancer, talk to your health care provider about having an MRI and a mammogram every year.  Breast cancer gene (BRCA) assessment is recommended for women who have family members with BRCA-related cancers. BRCA-related cancers include: ? Breast. ? Ovarian. ? Tubal. ? Peritoneal cancers.  Results of the assessment will determine the need for genetic counseling and BRCA1 and BRCA2 testing.  Cervical Cancer Your health care provider  may recommend that you be screened regularly for cancer of the pelvic organs (ovaries, uterus, and vagina). This screening involves a pelvic examination, including checking for microscopic changes to the surface of your cervix (Pap test). You may be encouraged to have this screening done every 3 years, beginning at age 13.  For women ages 85-65, health care providers may recommend pelvic exams and Pap testing every 3 years, or they may recommend the Pap and pelvic exam, combined with testing for human papilloma virus (HPV), every 5 years. Some types of HPV increase your risk of cervical cancer. Testing for HPV may also be done on women of any age with unclear Pap test results.  Other health care providers may not recommend any screening for nonpregnant women who are considered low risk for pelvic cancer and who do not have symptoms. Ask your health care provider if a screening pelvic exam is right for you.  If you have had past treatment for cervical cancer or a condition that could lead to cancer, you need Pap tests and screening for cancer for at least 20 years after your treatment. If Pap tests have been discontinued, your risk factors (such as having a new sexual partner) need to be reassessed  to determine if screening should resume. Some women have medical problems that increase the chance of getting cervical cancer. In these cases, your health care provider may recommend more frequent screening and Pap tests.  Colorectal Cancer  This type of cancer can be detected and often prevented.  Routine colorectal cancer screening usually begins at 82 years of age and continues through 82 years of age.  Your health care provider may recommend screening at an earlier age if you have risk factors for colon cancer.  Your health care provider may also recommend using home test kits to check for hidden blood in the stool.  A small camera at the end of a tube can be used to examine your colon directly  (sigmoidoscopy or colonoscopy). This is done to check for the earliest forms of colorectal cancer.  Routine screening usually begins at age 32.  Direct examination of the colon should be repeated every 5-10 years through 82 years of age. However, you may need to be screened more often if early forms of precancerous polyps or small growths are found.  Skin Cancer  Check your skin from head to toe regularly.  Tell your health care provider about any new moles or changes in moles, especially if there is a change in a mole's shape or color.  Also tell your health care provider if you have a mole that is larger than the size of a pencil eraser.  Always use sunscreen. Apply sunscreen liberally and repeatedly throughout the day.  Protect yourself by wearing long sleeves, pants, a wide-brimmed hat, and sunglasses whenever you are outside.  Heart disease, diabetes, and high blood pressure  High blood pressure causes heart disease and increases the risk of stroke. High blood pressure is more likely to develop in: ? People who have blood pressure in the high end of the normal range (130-139/85-89 mm Hg). ? People who are overweight or obese. ? People who are African American.  If you are 6-63 years of age, have your blood pressure checked every 3-5 years. If you are 25 years of age or older, have your blood pressure checked every year. You should have your blood pressure measured twice-once when you are at a hospital or clinic, and once when you are not at a hospital or clinic. Record the average of the two measurements. To check your blood pressure when you are not at a hospital or clinic, you can use: ? An automated blood pressure machine at a pharmacy. ? A home blood pressure monitor.  If you are between 66 years and 28 years old, ask your health care provider if you should take aspirin to prevent strokes.  Have regular diabetes screenings. This involves taking a blood sample to check your  fasting blood sugar level. ? If you are at a normal weight and have a low risk for diabetes, have this test once every three years after 82 years of age. ? If you are overweight and have a high risk for diabetes, consider being tested at a younger age or more often. Preventing infection Hepatitis B  If you have a higher risk for hepatitis B, you should be screened for this virus. You are considered at high risk for hepatitis B if: ? You were born in a country where hepatitis B is common. Ask your health care provider which countries are considered high risk. ? Your parents were born in a high-risk country, and you have not been immunized against hepatitis B (hepatitis B vaccine). ?  You have HIV or AIDS. ? You use needles to inject street drugs. ? You live with someone who has hepatitis B. ? You have had sex with someone who has hepatitis B. ? You get hemodialysis treatment. ? You take certain medicines for conditions, including cancer, organ transplantation, and autoimmune conditions.  Hepatitis C  Blood testing is recommended for: ? Everyone born from [birth year] through 1963. ? Anyone with known risk factors for hepatitis C.  Sexually transmitted infections (STIs)  You should be screened for sexually transmitted infections (STIs) including gonorrhea and chlamydia if: ? You are sexually active and are younger than 82 years of age. ? You are older than 82 years of age and your health care provider tells you that you are at risk for this type of infection. ? Your sexual activity has changed since you were last screened and you are at an increased risk for chlamydia or gonorrhea. Ask your health care provider if you are at risk.  If you do not have HIV, but are at risk, it may be recommended that you take a prescription medicine daily to prevent HIV infection. This is called pre-exposure prophylaxis (PrEP). You are considered at risk if: ? You are sexually active and do not regularly use condoms  or know the HIV status of your partner(s). ? You take drugs by injection. ? You are sexually active with a partner who has HIV.  Talk with your health care provider about whether you are at high risk of being infected with HIV. If you choose to begin PrEP, you should first be tested for HIV. You should then be tested every 3 months for as long as you are taking PrEP. Pregnancy  If you are premenopausal and you may become pregnant, ask your health care provider about preconception counseling.  If you may become pregnant, take 400 to 800 micrograms (mcg) of folic acid every day.  If you want to prevent pregnancy, talk to your health care provider about birth control (contraception). Osteoporosis and menopause  Osteoporosis is a disease in which the bones lose minerals and strength with aging. This can result in serious bone fractures. Your risk for osteoporosis can be identified using a bone density scan.  If you are 26 years of age or older, or if you are at risk for osteoporosis and fractures, ask your health care provider if you should be screened.  Ask your health care provider whether you should take a calcium or vitamin D supplement to lower your risk for osteoporosis.  Menopause may have certain physical symptoms and risks.  Hormone replacement therapy may reduce some of these symptoms and risks. Talk to your health care provider about whether hormone replacement therapy is right for you. Follow these instructions at home:  Schedule regular health, dental, and eye exams.  Stay current with your immunizations.  Do not use any tobacco products including cigarettes, chewing tobacco, or electronic cigarettes.  If you are pregnant, do not drink alcohol.  If you are breastfeeding, limit how much and how often you drink alcohol.  Limit alcohol intake to no more than 1 drink per day for nonpregnant women. One drink equals 12 ounces of beer, 5 ounces of wine, or 1 ounces of hard  liquor.  Do not use street drugs.  Do not share needles.  Ask your health care provider for help if you need support or information about quitting drugs.  Tell your health care provider if you often feel depressed.  Tell your  health care provider if you have ever been abused or do not feel safe at home. This information is not intended to replace advice given to you by your health care provider. Make sure you discuss any questions you have with your health care provider. Document Released: 07/05/2010 Document Revised: 05/28/2015 Document Reviewed: 09/23/2014 Elsevier Interactive Patient Education  [employer].

## 2017-03-08 NOTE — Progress Notes (Signed)
HPI:  Using dictation device. Unfortunately this device frequently misinterprets words/phrases.  Tracey Rosario is a pleasant 84 yo with a PMH significant for HTN, HLD, GERD, Depression, mild cognitive declined (seen by neurology) and OOB here for an acute visit for shoulder pain: -mentioned at [facility] 02/22/17 so was advised to schedule appt with me to evaluate: -reports: She has had some left-sided neck pain and upper back pain on and off for many years, this is worsened by certain movements/activities -tylenol seems to help -denies: Radiation to extremities, weakness or numbness  Bradycardia: -On intake vitals -This is not unusual for her, but it was a little lower when she arrived than usual and her  diastolic blood pressure is a little low -She has wanted to continue her beta-blocker in the past due to a history of palpitations and she has been on that medication a long time -She denies chest pain, shortness of breath, dizziness, palpitations or feeling lightheaded or fatigued  ROS: See pertinent positives and negatives per HPI.  Past Medical History:  Diagnosis Date  . Anemia   . Cognitive decline    referred to neurologist  . Depression 10/07/2013  . GERD (gastroesophageal reflux disease) 05/04/2013  . Hip fx (HCC)    r hip, bilat pubis rami  . Hyperlipemia   . Hypertension   . Palpitations 05/04/2013   saw cardiologist in 2015, he deleted A. Fib from the record as per note not documentation to support    Past Surgical History:  Procedure Laterality Date  . ABDOMINAL HYSTERECTOMY  1971  . BLADDER SURGERY  2010  . Broken Pelvis  2015  . HIP PINNING,CANNULATED Right 10/07/2013   Procedure: CANNULATED screws right hip;  Surgeon: Kathryne Hitch, MD;  [facility];  Service: Orthopedics;  Laterality: Right;    Family History  Problem Relation Age of Onset  . Prostate cancer Father   . Cancer Brother   . Cancer Sister   . Heart attack Sister   . Cancer Sister      Social History   Socioeconomic History  . Marital status: Widowed    Spouse name: None  . Number of children: 2  . Years of education: None  . Highest education level: None  Social Needs  . Financial resource strain: None  . Food insecurity - worry: None  . Food insecurity - inability: None  . Transportation needs - medical: None  . Transportation needs - non-medical: None  Occupational History  . Occupation: Retired  Tobacco Use  . Smoking status: Former Smoker    Types: Cigarettes    Last attempt to quit: 08/02/1970    Years since quitting: 46.6  . Smokeless tobacco: Never Used  . Tobacco comment: "smoked very little"  Substance and Sexual Activity  . Alcohol use: No    Alcohol/week: 0.0 oz  . Drug use: No  . Sexual activity: None  Other Topics Concern  . None  Social History Narrative   Work or School: none      Home Situation: lives alone, daughters live on street ([address])      Spiritual Beliefs: [employer]      Lifestyle: no regular exercise, diet is ok      3 grand children and 2 great grands; 2 1/2 and 3 months         Current Outpatient Medications:  .  amLODipine (NORVASC) 2.5 MG tablet, Take 1 tablet (2.5 mg total) by mouth daily., Disp: 90 tablet, Rfl: 1 .  aspirin 81 MG tablet, Take 162 mg by mouth daily. , Disp: , Rfl:  .  atenolol (TENORMIN) 25 MG tablet, Take 1 tablet (25 mg total) by mouth daily., Disp: 30 tablet, Rfl: 1 .  cholecalciferol (VITAMIN D) 1000 units tablet, Take 1,000 Units by mouth daily., Disp: , Rfl:  .  citalopram (CELEXA) 20 MG tablet, Take 1 tablet (20 mg total) by mouth daily., Disp: 90 tablet, Rfl: 1 .  fluticasone (FLONASE) 50 MCG/ACT nasal spray, USE 1 SPRAY IN EACH NOSTRIL DAILY, Disp: 48 g, Rfl: 1 .  IRON PO, Take by mouth., Disp: , Rfl:  .  lisinopril (PRINIVIL,ZESTRIL) 30 MG tablet, Take 1 tablet (30 mg total) by mouth daily., Disp: 90 tablet, Rfl: 1 .  omeprazole (PRILOSEC) 20 MG capsule, Take 1 capsule (20 mg  total) by mouth daily., Disp: 90 capsule, Rfl: 2 .  pravastatin (PRAVACHOL) 20 MG tablet, Take 1 tablet (20 mg total) by mouth daily., Disp: 90 tablet, Rfl: 3  EXAM:  Vitals:   03/09/17 1130  BP: (!) 122/58  Pulse: (!) 52  Temp: 97.6 F (36.4 C)    Body mass index is 24.62 kg/m.  GENERAL: vitals reviewed and listed above, alert, oriented, appears well hydrated and in no acute distress  HEENT: atraumatic, conjunttiva clear, no obvious abnormalities on inspection of external nose and ears  NECK: no obvious masses on inspection  LUNGS: clear to auscultation bilaterally, no wheezes, rales or rhonchi, good air movement  CV: HRRR, no peripheral edema  MS: moves all extremities without noticeable abnormality  PSYCH: pleasant and cooperative, no obvious depression or anxiety  ASSESSMENT AND PLAN:  Discussed the following assessment and plan:  Neck pain Chronic left-sided thoracic back pain -we discussed possible serious and likely etiologies, workup and treatment, treatment risks and return precautions -after this discussion, Tracey Rosario opted for home exercises, conservative symptomatic care with heat, topical menthol and/or Tylenol, consideration of osteopathic treatments and/or formal physical therapy -follow up advised in 1 month consideration of imaging if not improving,  -of course, we advised Tracey Rosario  to return or notify a doctor immediately if symptoms worsen or persist or new concerns arise.  Essential hypertension Bradycardia -Discussed and advised consideration and decreasing her atenolol with close follow-up -She and her daughter were agreeable to this, percent new dose of 25 mg -Follow-up in 1 month, sooner if any concerns  Patient Instructions  BEFORE YOU LEAVE: -neck and upper back exercises -follow up: 1 month  Please decrease the atenolol to 25 mg daily.  I sent a new prescription to your pharmacy.  This is to help your heart rate, as it was low today.  Please  alternate the neck and back exercises.  You may use heat, topical menthol such as Tiger balm and/or Tylenol as needed for pain per instructions.  You also could consider osteopathic treatments for your neck and back pain.  If you wish to schedule this please let us know.  Please check with your insurance regarding coverage for these services.  I hope you are feeling better soon. Please let us know if you have worsening symptoms or new symptoms before your follow-up visit.    Terressa KoyanagiHannah R Talani Brazee, DO

## 2017-03-09 ENCOUNTER — Encounter: Payer: Self-pay | Admitting: Family Medicine

## 2017-03-09 ENCOUNTER — Ambulatory Visit (INDEPENDENT_AMBULATORY_CARE_PROVIDER_SITE_OTHER): Payer: Medicare Other | Admitting: Family Medicine

## 2017-03-09 VITALS — BP 122/58 | HR 52 | Temp 97.6°F | Ht 68.5 in | Wt 164.3 lb

## 2017-03-09 DIAGNOSIS — I10 Essential (primary) hypertension: Secondary | ICD-10-CM | POA: Diagnosis not present

## 2017-03-09 DIAGNOSIS — G8929 Other chronic pain: Secondary | ICD-10-CM | POA: Diagnosis not present

## 2017-03-09 DIAGNOSIS — R001 Bradycardia, unspecified: Secondary | ICD-10-CM | POA: Diagnosis not present

## 2017-03-09 DIAGNOSIS — M542 Cervicalgia: Secondary | ICD-10-CM

## 2017-03-09 DIAGNOSIS — M546 Pain in thoracic spine: Secondary | ICD-10-CM

## 2017-03-09 MED ORDER — ATENOLOL 25 MG PO TABS: 25.0000 mg | ORAL_TABLET | Freq: Every day | ORAL | 1 refills | Status: DC

## 2017-03-09 NOTE — Patient Instructions (Signed)
BEFORE YOU LEAVE: -neck and upper back exercises -follow up: 1 month  Please decrease the atenolol to 25 mg daily.  I sent a new prescription to your pharmacy.  This is to help your heart rate, as it was low today.  Please alternate the neck and back exercises.  You may use heat, topical menthol such as Tiger balm and/or Tylenol as needed for pain per instructions.  You also could consider osteopathic treatments for your neck and back pain.  If you wish to schedule this please let us know.  Please check with your insurance regarding coverage for these services.  I hope you are feeling better soon. Please let us know if you have worsening symptoms or new symptoms before your follow-up visit.

## 2017-03-21 DIAGNOSIS — Z08 Encounter for follow-up examination after completed treatment for malignant neoplasm: Secondary | ICD-10-CM | POA: Diagnosis not present

## 2017-03-21 DIAGNOSIS — Z85820 Personal history of malignant melanoma of skin: Secondary | ICD-10-CM | POA: Diagnosis not present

## 2017-03-21 DIAGNOSIS — Z85828 Personal history of other malignant neoplasm of skin: Secondary | ICD-10-CM | POA: Diagnosis not present

## 2017-03-28 ENCOUNTER — Other Ambulatory Visit: Payer: Self-pay | Admitting: Family Medicine

## 2017-04-13 ENCOUNTER — Ambulatory Visit: Payer: Medicare Other | Admitting: Family Medicine

## 2017-05-16 ENCOUNTER — Other Ambulatory Visit: Payer: Self-pay | Admitting: Family Medicine

## 2017-05-18 ENCOUNTER — Encounter: Payer: Self-pay | Admitting: Family Medicine

## 2017-05-18 DIAGNOSIS — H35373 Puckering of macula, bilateral: Secondary | ICD-10-CM | POA: Diagnosis not present

## 2017-05-18 DIAGNOSIS — H1852 Epithelial (juvenile) corneal dystrophy: Secondary | ICD-10-CM | POA: Diagnosis not present

## 2017-05-18 DIAGNOSIS — H04123 Dry eye syndrome of bilateral lacrimal glands: Secondary | ICD-10-CM | POA: Diagnosis not present

## 2017-05-18 DIAGNOSIS — Z961 Presence of intraocular lens: Secondary | ICD-10-CM | POA: Diagnosis not present

## 2017-05-18 LAB — HM DIABETES EYE EXAM

## 2017-06-17 ENCOUNTER — Other Ambulatory Visit: Payer: Self-pay | Admitting: Family Medicine

## 2017-07-10 ENCOUNTER — Ambulatory Visit (INDEPENDENT_AMBULATORY_CARE_PROVIDER_SITE_OTHER): Payer: Medicare Other | Admitting: Physician Assistant

## 2017-07-10 ENCOUNTER — Encounter (INDEPENDENT_AMBULATORY_CARE_PROVIDER_SITE_OTHER): Payer: Self-pay | Admitting: Physician Assistant

## 2017-07-10 ENCOUNTER — Ambulatory Visit (INDEPENDENT_AMBULATORY_CARE_PROVIDER_SITE_OTHER): Payer: Self-pay

## 2017-07-10 DIAGNOSIS — M542 Cervicalgia: Secondary | ICD-10-CM | POA: Diagnosis not present

## 2017-07-10 MED ORDER — METHYLPREDNISOLONE 4 MG PO TABS: ORAL_TABLET | ORAL | 0 refills | Status: DC

## 2017-07-10 NOTE — Progress Notes (Signed)
Office Visit Note   Patient: Tracey Rosario           Date of Birth: [birth year]           MRN: [id number] Visit Date: 07/10/2017              Requested by: Tracey KoyanagiKim, Hannah R, DO [address] [place], [place] [id number] PCP: Tracey KoyanagiKim, Hannah R, DO   Assessment & Plan: Visit Diagnoses:  1. Cervicalgia     Plan: We will send her to physical therapy for range of motion, modalities and a home exercise program for cervical spine.  Also placed her on Medrol Dosepak.  Have her follow-up in 4 weeks to check her progress lack of.  If she gets no relief with this may try a subacromial injection in her shoulder.  I explained to her and her daughter than often the pain can be referred from the neck down into the shoulder and given her current physical exam and  discomfort with range of motion of her neck that we will start with treating her neck.  Follow-Up Instructions: Return in about 1 month (around 08/07/2017).   Orders:  Orders Placed This Encounter  Procedures  . XR Cervical Spine 2 or 3 views  . Ambulatory referral to Physical Therapy   Meds ordered this encounter  Medications  . methylPREDNISolone (MEDROL) 4 MG tablet    Sig: Take as directed    Dispense:  21 tablet    Refill:  0      Procedures: No procedures performed   Clinical Data: No additional findings.   Subjective: Chief Complaint  Patient presents with  . pain in left scapular/axillary area    HPI Tracey Rosario is an 84 year old female only seen since 2015 at that time she had undergone a cannulated pinning left femoral neck fracture.  She also had some shoulder pain on the left in the past on radiograph has deficient rotator cuff.  She comes in today complaining of left scapula pain radiates down into her left flank area.  Her pain begins in her neck.  She states pain is been ongoing for years but has gotten worse over the last few months.  Pain is worse with doing household duties such as not making been she  describes no numbness tingling down either arm.  She notes she does have some discomfort with range of motion of the neck.  No waking pain. Review of Systems See HPI  Objective: Vital Signs: There were no vitals taken for this visit.  Physical Exam  Constitutional: She is oriented to person, place, and time. She appears well-developed and well-nourished. No distress.  Cardiovascular: Intact distal pulses.  Pulmonary/Chest: Effort normal.  Neurological: She is alert and oriented to person, place, and time.  Skin: She is not diaphoretic.  Psychiatric: She has a normal mood and affect.    Ortho Exam Cervical spine she has good flexion extension without pain.  Rotation to the left causes some discomfort in the lateral aspect of her neck.  She has positive Spurling's.  Tenderness over the lower cervical spinal column.  Tenderness over the medial border of the left scapula.  She has good range of motion of bilateral shoulders with full forward flexion abduction without pain.  Negative impingement testing bilaterally.  Empty can test is negative bilaterally she has weakness with external rotation of the left shoulder against resistance otherwise 5 out of 5 strength with internal and external rotation bilateral shoulders.  She has  full sensation full motor bilateral hands. Specialty Comments:  No specialty comments available.  Imaging: Xr Cervical Spine 2 Or 3 Views  Result Date: 07/10/2017 Cervical spine AP and lateral views: Disc space well maintained.  No spinal listhesis.  No acute fractures.  Some minimal degenerative changes otherwise cervical spine is well-maintained.    PMFS History: Patient Active Problem List   Diagnosis Date Noted  . Other osteoporosis without current pathological fracture 01/28/2016  . Mild cognitive impairment 10/15/2015  . Esophageal reflux 06/11/2015  . Small vessel disease (HCC) 04/30/2015  . Essential hypertension 04/07/2015  . Tremor 04/07/2015  . Major  depressive disorder with single episode, in full remission (HCC) 03/30/2015  . Absolute anemia 03/30/2015  . Asymptomatic PVCs 08/01/2013  . Hyperlipidemia 06/04/2013   Past Medical History:  Diagnosis Date  . Anemia   . Cognitive decline    referred to neurologist  . Depression 10/07/2013  . GERD (gastroesophageal reflux disease) 05/04/2013  . Hip fx (HCC)    Rosario hip, bilat pubis rami  . Hyperlipemia   . Hypertension   . Palpitations 05/04/2013   saw cardiologist in 2015, he deleted A. Fib from the record as per note not documentation to support    Family History  Problem Relation Age of Onset  . Prostate cancer Father   . Cancer Brother   . Cancer Sister   . Heart attack Sister   . Cancer Sister     Past Surgical History:  Procedure Laterality Date  . ABDOMINAL HYSTERECTOMY  1971  . BLADDER SURGERY  2010  . Broken Pelvis  2015  . HIP PINNING,CANNULATED Right 10/07/2013   Procedure: CANNULATED screws right hip;  Surgeon: Kathryne Hitch, MD;  Location[facility] OR;  Service: Orthopedics;  Laterality: Right;   Social History   Occupational History  . Occupation: Retired  Tobacco Use  . Smoking status: Former Smoker    Types: Cigarettes    Last attempt to quit: 08/02/1970    Years since quitting: 46.9  . Smokeless tobacco: Never Used  . Tobacco comment: "smoked very little"  Substance and Sexual Activity  . Alcohol use: No    Alcohol/week: 0.0 oz  . Drug use: No  . Sexual activity: Not on file

## 2017-07-11 ENCOUNTER — Encounter: Payer: Self-pay | Admitting: Family Medicine

## 2017-07-19 DIAGNOSIS — M6281 Muscle weakness (generalized): Secondary | ICD-10-CM | POA: Diagnosis not present

## 2017-07-19 DIAGNOSIS — M542 Cervicalgia: Secondary | ICD-10-CM | POA: Diagnosis not present

## 2017-07-19 DIAGNOSIS — M25612 Stiffness of left shoulder, not elsewhere classified: Secondary | ICD-10-CM | POA: Diagnosis not present

## 2017-07-21 ENCOUNTER — Encounter: Payer: Self-pay | Admitting: Family Medicine

## 2017-07-24 DIAGNOSIS — M6281 Muscle weakness (generalized): Secondary | ICD-10-CM | POA: Diagnosis not present

## 2017-07-24 DIAGNOSIS — M25612 Stiffness of left shoulder, not elsewhere classified: Secondary | ICD-10-CM | POA: Diagnosis not present

## 2017-07-24 DIAGNOSIS — M542 Cervicalgia: Secondary | ICD-10-CM | POA: Diagnosis not present

## 2017-07-26 DIAGNOSIS — M6281 Muscle weakness (generalized): Secondary | ICD-10-CM | POA: Diagnosis not present

## 2017-07-26 DIAGNOSIS — M542 Cervicalgia: Secondary | ICD-10-CM | POA: Diagnosis not present

## 2017-07-26 DIAGNOSIS — M25612 Stiffness of left shoulder, not elsewhere classified: Secondary | ICD-10-CM | POA: Diagnosis not present

## 2017-07-31 DIAGNOSIS — M6281 Muscle weakness (generalized): Secondary | ICD-10-CM | POA: Diagnosis not present

## 2017-07-31 DIAGNOSIS — M25612 Stiffness of left shoulder, not elsewhere classified: Secondary | ICD-10-CM | POA: Diagnosis not present

## 2017-07-31 DIAGNOSIS — M542 Cervicalgia: Secondary | ICD-10-CM | POA: Diagnosis not present

## 2017-08-02 DIAGNOSIS — M542 Cervicalgia: Secondary | ICD-10-CM | POA: Diagnosis not present

## 2017-08-02 DIAGNOSIS — M6281 Muscle weakness (generalized): Secondary | ICD-10-CM | POA: Diagnosis not present

## 2017-08-02 DIAGNOSIS — M25612 Stiffness of left shoulder, not elsewhere classified: Secondary | ICD-10-CM | POA: Diagnosis not present

## 2017-08-07 ENCOUNTER — Encounter (INDEPENDENT_AMBULATORY_CARE_PROVIDER_SITE_OTHER): Payer: Self-pay | Admitting: Physician Assistant

## 2017-08-07 ENCOUNTER — Ambulatory Visit (INDEPENDENT_AMBULATORY_CARE_PROVIDER_SITE_OTHER): Payer: Medicare Other | Admitting: Physician Assistant

## 2017-08-07 DIAGNOSIS — M542 Cervicalgia: Secondary | ICD-10-CM | POA: Diagnosis not present

## 2017-08-07 DIAGNOSIS — M25612 Stiffness of left shoulder, not elsewhere classified: Secondary | ICD-10-CM | POA: Diagnosis not present

## 2017-08-07 DIAGNOSIS — M6281 Muscle weakness (generalized): Secondary | ICD-10-CM | POA: Diagnosis not present

## 2017-08-07 DIAGNOSIS — G8929 Other chronic pain: Secondary | ICD-10-CM | POA: Diagnosis not present

## 2017-08-07 DIAGNOSIS — M25512 Pain in left shoulder: Secondary | ICD-10-CM | POA: Diagnosis not present

## 2017-08-07 MED ORDER — LIDOCAINE HCL 1 % IJ SOLN
3.0000 mL | INTRAMUSCULAR | Status: AC | PRN
  Administered 2017-08-07: 3 mL

## 2017-08-07 MED ORDER — METHYLPREDNISOLONE ACETATE 40 MG/ML IJ SUSP
40.0000 mg | INTRAMUSCULAR | Status: AC | PRN
  Administered 2017-08-07: 40 mg via INTRA_ARTICULAR

## 2017-08-07 NOTE — Progress Notes (Signed)
Office Visit Note   Patient: Tracey Rosario           Date of Birth: [birth year]           MRN: [id number] Visit Date: 08/07/2017              Requested by: [name], DO [address] [place], [place] [address] PCP: [name], DO   Assessment & Plan: Visit Diagnoses:  1. Chronic left shoulder pain     Plan: We will have patient work on pendulum exercises, wall crawls and forward flexion exercises of the left shoulder.  She will follow-up with Dr. Magnus Ivan in 1 month check progress lack of.  She is to keep a record of how the injection in the left shoulder did or did not relieve her pain.  She will continue physical therapy for neck and shoulder.  Follow-Up Instructions: Return in about 1 month (around 09/04/2017).   Orders:  Orders Placed This Encounter  Procedures  . Large Joint Inj: L subacromial bursa   No orders of the defined types were placed in this encounter.     Procedures: Large Joint Inj: L subacromial bursa on 08/07/2017 11:06 AM Indications: pain Details: 22 G 1.5 in needle, superior approach  Arthrogram: No  Medications: 3 mL lidocaine 1 %; 40 mg methylPREDNISolone acetate 40 MG/ML Outcome: tolerated well, no immediate complications Procedure, treatment alternatives, risks and benefits explained, specific risks discussed. Consent was given by the patient. Immediately prior to procedure a time out was called to verify the correct patient, procedure, equipment, support staff and site/side marked as required. Patient was prepped and draped in the usual sterile fashion.       Clinical Data: No additional findings.   Subjective: Chief Complaint  Patient presents with  . Neck - Follow-up    HPI Tracey Rosario returns today follow-up of her neck and shoulder pain.  She again is very vague about what type of pain she has she does states she has pain with activity starts in the left shoulder but that radiates down into the ribs at times.  She  denies any numbness tingling down the arm.  She does feel that therapy for neck has definitely helped with the pain but she continues to have pain in the lower border of the scapula. Review of Systems Please see HPI otherwise negative Objective: Vital Signs: There were no vitals taken for this visit.  Physical Exam General: Well-developed well-nourished female no acute distress mood and affect appropriate Psych alert and oriented x3 Skin: Left shoulder and upper thorax region without rashes skin lesions ulcerations. Ortho Exam Cervical spine good range of motion without pain.  Negative Spurling's.  Nontender over the cervical spinal column.  She has a little bit of discomfort with forward flexion and lacks the last 5 to 10 degrees and full forward flexion of the left shoulder.  5 out of 5 strength with external and internal rotation bilateral shoulders.  Negative impingement testing on the right slight discomfort with the left impingement testing.  Empty can test negative bilaterally.  She has tenderness at the inferior angle of the left scapula only. Specialty Comments:  No specialty comments available.  Imaging: No results found.   PMFS History: Patient Active Problem List   Diagnosis Date Noted  . Other osteoporosis without current pathological fracture 01/28/2016  . Mild cognitive impairment 10/15/2015  . Esophageal reflux 06/11/2015  . Small vessel disease (HCC) 04/30/2015  . Essential hypertension 04/07/2015  .  Tremor 04/07/2015  . Major depressive disorder with single episode, in full remission (HCC) 03/30/2015  . Absolute anemia 03/30/2015  . Asymptomatic PVCs 08/01/2013  . Hyperlipidemia 06/04/2013   Past Medical History:  Diagnosis Date  . Anemia   . Cognitive decline    referred to neurologist  . Depression 10/07/2013  . GERD (gastroesophageal reflux disease) 05/04/2013  . Hip fx (HCC)    r hip, bilat pubis rami  . Hyperlipemia   . Hypertension   . Palpitations  05/04/2013   saw cardiologist in 2015, he deleted A. Fib from the record as per note not documentation to support    Family History  Problem Relation Age of Onset  . Prostate cancer Father   . Cancer Brother   . Cancer Sister   . Heart attack Sister   . Cancer Sister     Past Surgical History:  Procedure Laterality Date  . ABDOMINAL HYSTERECTOMY  1971  . BLADDER SURGERY  2010  . Broken Pelvis  2015  . HIP PINNING,CANNULATED Right 10/07/2013   Procedure: CANNULATED screws right hip;  Surgeon: Kathryne Hitchhristopher Y Blackman, MD;  [facility];  Service: Orthopedics;  Laterality: Right;   Social History   Occupational History  . Occupation: Retired  Tobacco Use  . Smoking status: Former Smoker    Types: Cigarettes    Last attempt to quit: 08/02/1970    Years since quitting: 47.0  . Smokeless tobacco: Never Used  . Tobacco comment: "smoked very little"  Substance and Sexual Activity  . Alcohol use: No    Alcohol/week: 0.0 oz  . Drug use: No  . Sexual activity: Not on file

## 2017-08-09 DIAGNOSIS — M6281 Muscle weakness (generalized): Secondary | ICD-10-CM | POA: Diagnosis not present

## 2017-08-09 DIAGNOSIS — M25612 Stiffness of left shoulder, not elsewhere classified: Secondary | ICD-10-CM | POA: Diagnosis not present

## 2017-08-09 DIAGNOSIS — M542 Cervicalgia: Secondary | ICD-10-CM | POA: Diagnosis not present

## 2017-08-14 ENCOUNTER — Other Ambulatory Visit: Payer: Self-pay | Admitting: Family Medicine

## 2017-08-14 DIAGNOSIS — M25612 Stiffness of left shoulder, not elsewhere classified: Secondary | ICD-10-CM | POA: Diagnosis not present

## 2017-08-14 DIAGNOSIS — M6281 Muscle weakness (generalized): Secondary | ICD-10-CM | POA: Diagnosis not present

## 2017-08-14 DIAGNOSIS — M542 Cervicalgia: Secondary | ICD-10-CM | POA: Diagnosis not present

## 2017-08-16 DIAGNOSIS — M25612 Stiffness of left shoulder, not elsewhere classified: Secondary | ICD-10-CM | POA: Diagnosis not present

## 2017-08-16 DIAGNOSIS — M542 Cervicalgia: Secondary | ICD-10-CM | POA: Diagnosis not present

## 2017-08-16 DIAGNOSIS — M6281 Muscle weakness (generalized): Secondary | ICD-10-CM | POA: Diagnosis not present

## 2017-09-06 ENCOUNTER — Ambulatory Visit (INDEPENDENT_AMBULATORY_CARE_PROVIDER_SITE_OTHER): Payer: Medicare Other | Admitting: Orthopaedic Surgery

## 2017-09-06 ENCOUNTER — Encounter (INDEPENDENT_AMBULATORY_CARE_PROVIDER_SITE_OTHER): Payer: Self-pay | Admitting: Orthopaedic Surgery

## 2017-09-06 DIAGNOSIS — M25512 Pain in left shoulder: Secondary | ICD-10-CM

## 2017-09-06 DIAGNOSIS — G8929 Other chronic pain: Secondary | ICD-10-CM

## 2017-09-06 NOTE — Progress Notes (Signed)
The patient is here for follow-up of her left shoulder.  She is 84 years old and had a subacromial steroid injection placed in her shoulder last month.  She is feeling better overall.  She has good days and bad days and feels this is the way is going to be but she is now uncomfortable.  On exam she can reach overhead completely but she does show deficits of the rotator cuff with using her deltoids more to abduct her shoulder on the left side with the right side being normal.  She tolerated me easily putting her shoulder through grind test as well as minimal crossarm pain and she responded well.  She will continue her home exercise program with her shoulder so it does not get stiff for her.  We can always place a subacromial injection again in the fall if needed.  All question concerns were answered and addressed.  Follow-up will be as needed.

## 2017-10-20 ENCOUNTER — Other Ambulatory Visit: Payer: Self-pay | Admitting: Family Medicine

## 2017-11-07 ENCOUNTER — Other Ambulatory Visit: Payer: Self-pay | Admitting: Family Medicine

## 2017-11-09 DIAGNOSIS — M546 Pain in thoracic spine: Secondary | ICD-10-CM | POA: Diagnosis not present

## 2017-11-09 DIAGNOSIS — K219 Gastro-esophageal reflux disease without esophagitis: Secondary | ICD-10-CM | POA: Diagnosis not present

## 2017-11-09 DIAGNOSIS — R5383 Other fatigue: Secondary | ICD-10-CM | POA: Diagnosis not present

## 2017-11-09 DIAGNOSIS — M542 Cervicalgia: Secondary | ICD-10-CM | POA: Diagnosis not present

## 2017-11-09 DIAGNOSIS — E559 Vitamin D deficiency, unspecified: Secondary | ICD-10-CM | POA: Diagnosis not present

## 2017-11-09 DIAGNOSIS — I10 Essential (primary) hypertension: Secondary | ICD-10-CM | POA: Diagnosis not present

## 2017-11-09 DIAGNOSIS — R413 Other amnesia: Secondary | ICD-10-CM | POA: Diagnosis not present

## 2017-11-12 ENCOUNTER — Other Ambulatory Visit: Payer: Self-pay | Admitting: Family Medicine

## 2017-11-23 DIAGNOSIS — E039 Hypothyroidism, unspecified: Secondary | ICD-10-CM | POA: Diagnosis not present

## 2017-11-23 DIAGNOSIS — Z131 Encounter for screening for diabetes mellitus: Secondary | ICD-10-CM | POA: Diagnosis not present

## 2017-11-23 DIAGNOSIS — M539 Dorsopathy, unspecified: Secondary | ICD-10-CM | POA: Diagnosis not present

## 2017-11-23 DIAGNOSIS — D539 Nutritional anemia, unspecified: Secondary | ICD-10-CM | POA: Diagnosis not present

## 2017-11-23 DIAGNOSIS — R413 Other amnesia: Secondary | ICD-10-CM | POA: Diagnosis not present

## 2017-11-23 DIAGNOSIS — I10 Essential (primary) hypertension: Secondary | ICD-10-CM | POA: Diagnosis not present

## 2017-12-04 DIAGNOSIS — R42 Dizziness and giddiness: Secondary | ICD-10-CM | POA: Diagnosis not present

## 2017-12-04 DIAGNOSIS — M79604 Pain in right leg: Secondary | ICD-10-CM | POA: Diagnosis not present

## 2017-12-04 DIAGNOSIS — M79605 Pain in left leg: Secondary | ICD-10-CM | POA: Diagnosis not present

## 2017-12-16 ENCOUNTER — Other Ambulatory Visit: Payer: Self-pay | Admitting: Family Medicine

## 2018-01-01 ENCOUNTER — Other Ambulatory Visit: Payer: Self-pay | Admitting: Family Medicine

## 2018-01-01 DIAGNOSIS — E785 Hyperlipidemia, unspecified: Secondary | ICD-10-CM

## 2018-01-10 DIAGNOSIS — R42 Dizziness and giddiness: Secondary | ICD-10-CM | POA: Diagnosis not present

## 2018-01-10 DIAGNOSIS — R0602 Shortness of breath: Secondary | ICD-10-CM | POA: Diagnosis not present

## 2018-01-10 DIAGNOSIS — I10 Essential (primary) hypertension: Secondary | ICD-10-CM | POA: Diagnosis not present

## 2018-01-10 DIAGNOSIS — M539 Dorsopathy, unspecified: Secondary | ICD-10-CM | POA: Diagnosis not present

## 2018-01-10 DIAGNOSIS — E039 Hypothyroidism, unspecified: Secondary | ICD-10-CM | POA: Diagnosis not present

## 2018-01-11 DIAGNOSIS — Z803 Family history of malignant neoplasm of breast: Secondary | ICD-10-CM | POA: Diagnosis not present

## 2018-01-11 DIAGNOSIS — Z1231 Encounter for screening mammogram for malignant neoplasm of breast: Secondary | ICD-10-CM | POA: Diagnosis not present

## 2018-01-24 DIAGNOSIS — R0602 Shortness of breath: Secondary | ICD-10-CM | POA: Diagnosis not present

## 2018-01-24 DIAGNOSIS — R42 Dizziness and giddiness: Secondary | ICD-10-CM | POA: Diagnosis not present

## 2018-01-28 ENCOUNTER — Other Ambulatory Visit: Payer: Self-pay | Admitting: Family Medicine

## 2018-02-05 ENCOUNTER — Other Ambulatory Visit: Payer: Self-pay | Admitting: *Deleted

## 2018-02-05 ENCOUNTER — Other Ambulatory Visit: Payer: Self-pay | Admitting: Family Medicine

## 2018-02-05 MED ORDER — ATENOLOL 25 MG PO TABS: 25.0000 mg | ORAL_TABLET | Freq: Every day | ORAL | 0 refills | Status: DC

## 2018-02-05 NOTE — Telephone Encounter (Signed)
Rx request received for Metoprolol 50mg  from [facility].  Last office visit stated the pt should be taking 25mg  and I called the pts daughter to confirm this.  She stated she does think this is the dose the pt should be taking and is aware the Rx was sent.  She also stated she will check with the pt and call back for a follow up visit.

## 2018-02-11 ENCOUNTER — Other Ambulatory Visit: Payer: Self-pay | Admitting: Family Medicine

## 2018-02-21 DIAGNOSIS — M62838 Other muscle spasm: Secondary | ICD-10-CM | POA: Diagnosis not present

## 2018-02-21 DIAGNOSIS — E7800 Pure hypercholesterolemia, unspecified: Secondary | ICD-10-CM | POA: Diagnosis not present

## 2018-02-21 DIAGNOSIS — E559 Vitamin D deficiency, unspecified: Secondary | ICD-10-CM | POA: Diagnosis not present

## 2018-02-21 DIAGNOSIS — Z79899 Other long term (current) drug therapy: Secondary | ICD-10-CM | POA: Diagnosis not present

## 2018-02-21 DIAGNOSIS — M129 Arthropathy, unspecified: Secondary | ICD-10-CM | POA: Diagnosis not present

## 2018-02-21 DIAGNOSIS — I10 Essential (primary) hypertension: Secondary | ICD-10-CM | POA: Diagnosis not present

## 2018-02-21 DIAGNOSIS — M25561 Pain in right knee: Secondary | ICD-10-CM | POA: Diagnosis not present

## 2018-02-21 DIAGNOSIS — E039 Hypothyroidism, unspecified: Secondary | ICD-10-CM | POA: Diagnosis not present

## 2018-02-21 DIAGNOSIS — D509 Iron deficiency anemia, unspecified: Secondary | ICD-10-CM | POA: Diagnosis not present

## 2018-02-21 DIAGNOSIS — M539 Dorsopathy, unspecified: Secondary | ICD-10-CM | POA: Diagnosis not present

## 2018-02-27 ENCOUNTER — Ambulatory Visit: Payer: Medicare Other

## 2018-03-09 ENCOUNTER — Other Ambulatory Visit: Payer: Self-pay | Admitting: Family Medicine

## 2018-03-09 DIAGNOSIS — E785 Hyperlipidemia, unspecified: Secondary | ICD-10-CM

## 2018-04-06 ENCOUNTER — Other Ambulatory Visit: Payer: Self-pay | Admitting: Family Medicine

## 2018-04-06 NOTE — Telephone Encounter (Signed)
Patient needs an appt.  I left a message for the pts daughter to return my call.

## 2018-04-09 NOTE — Telephone Encounter (Signed)
Rx sent to the pharmacy with note stating pt needs an appt.

## 2018-04-12 DIAGNOSIS — M62838 Other muscle spasm: Secondary | ICD-10-CM | POA: Diagnosis not present

## 2018-04-12 DIAGNOSIS — I10 Essential (primary) hypertension: Secondary | ICD-10-CM | POA: Diagnosis not present

## 2018-04-12 DIAGNOSIS — E039 Hypothyroidism, unspecified: Secondary | ICD-10-CM | POA: Diagnosis not present

## 2018-04-12 DIAGNOSIS — M539 Dorsopathy, unspecified: Secondary | ICD-10-CM | POA: Diagnosis not present

## 2018-04-13 ENCOUNTER — Other Ambulatory Visit: Payer: Self-pay | Admitting: Family Medicine

## 2018-05-04 ENCOUNTER — Other Ambulatory Visit: Payer: Self-pay | Admitting: Family Medicine

## 2018-06-19 DIAGNOSIS — Z03818 Encounter for observation for suspected exposure to other biological agents ruled out: Secondary | ICD-10-CM | POA: Diagnosis not present

## 2018-07-08 ENCOUNTER — Other Ambulatory Visit: Payer: Self-pay | Admitting: Family Medicine

## 2018-07-23 DIAGNOSIS — H43392 Other vitreous opacities, left eye: Secondary | ICD-10-CM | POA: Diagnosis not present

## 2018-07-23 DIAGNOSIS — H16223 Keratoconjunctivitis sicca, not specified as Sjogren's, bilateral: Secondary | ICD-10-CM | POA: Diagnosis not present

## 2018-07-23 DIAGNOSIS — H04123 Dry eye syndrome of bilateral lacrimal glands: Secondary | ICD-10-CM | POA: Diagnosis not present

## 2018-08-02 ENCOUNTER — Other Ambulatory Visit: Payer: Self-pay

## 2018-08-07 NOTE — Progress Notes (Addendum)
[facility] - Clinic Note  08/08/2018     CHIEF COMPLAINT Patient presents for Retina Evaluation   HISTORY OF PRESENT ILLNESS: Tracey Rosario is a 85 y.o. female who presents to the clinic today for:   HPI    Retina Evaluation    In left eye.  Onset: Several months.  Duration: Several months.  Associated Symptoms Floaters and Flashes.  Negative for Blind Spot, Photophobia, Scalp Tenderness, Fever, Pain, Glare, Jaw Claudication, Weight Loss, Distortion, Redness, Trauma, Shoulder/Hip pain and Fatigue.  Context:  distance vision, mid-range vision and near vision.  Treatments tried include no treatments.  I, the attending physician,  performed the HPI with the patient and updated documentation appropriately.          Comments    Patient referred by Dr. Weaver for floaters OS for several months. Starting seeing white flash in vision a couple of days ago. Not sure if flash is coming from OD or OS. Using Restasis bid OS and FML bid OS.        Last edited by , , MD on 08/08/2018  2:39 PM. (History)    pt states she was referred here by Dr. Weaver for floaters in her left eye, she states they have been there for a few months, she states it looks like black spot with webs on it, she states it has not changed since she first noticed it, she states it does move around when she moves her eye, pt states she had cataract sx years ago in [place]  Referring physician: Weaver, Christopher D, MD [address] [place],  [place] [address]  HISTORICAL INFORMATION:   Selected notes from the medical record:  Referred by Dr. Chris Weaver for retinal eval LEE: 07.20.20 (K. Hecker) [[facility]: OD: 20/25 OS: 20/60] Ocular Hx-pseudo OU, ERM OU, DES OU, ABM, corneal dystrophy, retinoschisis, HTN ret, ptosis, conj cyst PMH-HTN, HLD, headaches, anxiety   CURRENT MEDICATIONS: Current Outpatient Medications (Ophthalmic Drugs)  Medication Sig  . fluorometholone (FML) 0.1 %  ophthalmic suspension Place 1 drop into the left eye 2 (two) times daily.  . RESTASIS 0.05 % ophthalmic emulsion Place 1 drop into the left eye 2 (two) times daily.   No current facility-administered medications for this visit.  (Ophthalmic Drugs)   Current Outpatient Medications (Other)  Medication Sig  . amLODipine (NORVASC) 2.5 MG tablet TAKE 1 TABLET DAILY (NEED AN APPOINTMENT)  . aspirin 81 MG tablet Take 162 mg by mouth daily.   . atenolol (TENORMIN) 25 MG tablet TAKE 1 TABLET DAILY (NEED AN APPOINTMENT, CALL FOR VIRTUAL VISIT)  . cholecalciferol (VITAMIN D) 1000 units tablet Take 1,000 Units by mouth daily.  . citalopram (CELEXA) 20 MG tablet TAKE 1 TABLET DAILY (NEED AN APPOINTMENT)  . fluticasone (FLONASE) 50 MCG/ACT nasal spray USE 1 SPRAY IN EACH NOSTRIL DAILY (NEED APPOINTMENT)  . IRON PO Take by mouth.  . lisinopril (PRINIVIL,ZESTRIL) 30 MG tablet TAKE 1 TABLET DAILY (NEED AN APPOINTMENT)  . methylPREDNISolone (MEDROL) 4 MG tablet Take as directed  . omeprazole (PRILOSEC) 20 MG capsule TAKE 1 CAPSULE DAILY  . pravastatin (PRAVACHOL) 20 MG tablet TAKE 1 TABLET DAILY (NEED AN APPOINTMENT)   No current facility-administered medications for this visit.  (Other)      REVIEW OF SYSTEMS: ROS    Positive for: Eyes   Negative for: Constitutional, Gastrointestinal, Neurological, Skin, Genitourinary, Musculoskeletal, HENT, Endocrine, Cardiovascular, Respiratory, Psychiatric, Allergic/Imm, Heme/Lymph   Last edited by Barber, Daryl D   on 08/08/2018  2:17 PM. (History)       ALLERGIES Allergies  Allergen Reactions  . Codeine Other (See Comments)    Makes her crazy    PAST MEDICAL HISTORY Past Medical History:  Diagnosis Date  . Anemia   . Cognitive decline    referred to neurologist  . Depression 10/07/2013  . GERD (gastroesophageal reflux disease) 05/04/2013  . Hip fx (HCC)    r hip, bilat pubis rami  . Hyperlipemia   . Hypertension   . Palpitations 05/04/2013   saw  cardiologist in 2015, he deleted A. Fib from the record as per note not documentation to support   Past Surgical History:  Procedure Laterality Date  . ABDOMINAL HYSTERECTOMY  1971  . BLADDER SURGERY  2010  . Broken Pelvis  2015  . C-EYE SURGERY PROCEDURE Bilateral    zaldivar  . CATARACT EXTRACTION Bilateral    Done in [place]  . HIP PINNING,CANNULATED Right 10/07/2013   Procedure: CANNULATED screws right hip;  Surgeon: Christopher Y Blackman, MD;  Location: [facility] OR;  Service: Orthopedics;  Laterality: Right;    FAMILY HISTORY Family History  Problem Relation Age of Onset  . Prostate cancer Father   . Cancer Brother   . Cancer Sister   . Heart attack Sister   . Cancer Sister     SOCIAL HISTORY Social History   Tobacco Use  . Smoking status: Former Smoker    Types: Cigarettes    Quit date: 08/02/1970    Years since quitting: 48.0  . Smokeless tobacco: Never Used  . Tobacco comment: "smoked very little"  Substance Use Topics  . Alcohol use: No    Alcohol/week: 0.0 standard drinks  . Drug use: No         OPHTHALMIC EXAM:  Base Eye Exam    Visual Acuity (Snellen - Linear)      Right Left   Dist [place] 20/40 20/100 -1   Dist ph [place] 20/40 +2 20/60 -2       Tonometry (Tonopen, 2:29 PM)      Right Left   Pressure 16 17       Pupils      Dark Light Shape React APD   Right 3 2 Round Brisk None   Left 4 3 Round Brisk None       Visual Fields (Counting fingers)      Left Right    Full Full       Extraocular Movement      Right Left    Full, Ortho Full, Ortho       Neuro/Psych    Oriented x3: Yes   Mood/Affect: Normal       Dilation    Both eyes: 1.0% Mydriacyl, 2.5% Phenylephrine @ 2:29 PM        Slit Lamp and Fundus Exam    Slit Lamp Exam      Right Left   Lids/Lashes Dermatochalasis - upper lid Dermatochalasis - upper lid   Conjunctiva/Sclera White and quiet White and quiet   Cornea Arcus, 1+ Punctate epithelial erosions, EBMD, corneal haze  Arcus, 1-2+ Punctate epithelial erosions, EBMD   Anterior Chamber Deep and quiet Deep and quiet   Iris Round and dilated Round and moderately dilated to 5mm   Lens PC IOL in good position PC IOL in good position with open PC   Vitreous Vitreous syneresis, Posterior vitreous detachment Vitreous syneresis; PVD; vitreous condensations       Fundus Exam        Right Left   Disc Mild temporal Pallor, Peripapillary atrophy mild tilt, Mild temporal Pallor, Peripapillary atrophy   C/D Ratio 0.1 0.1   Macula Flat, Blunted foveal reflex, Epiretinal membrane, No heme or edema Flat, Blunted foveal reflex, Epiretinal membrane, No heme or edema   Vessels Vascular attenuation, Tortuous Vascular attenuation, Tortuous   Periphery bullous IT schisis cavity from 0700-0800 extending posteriorly almost to arcades otherwise attached, focal pigmented CR scar at 0630 Large sub-retinal hemorrhage temporally spanning from 0200-0430        Refraction    Wearing Rx   OTC readers only       Manifest Refraction      Sphere Cylinder Axis Dist [place]   Right +0.25 +0.25 136 20/30   Left -0.50 +2.50 178 20/60          IMAGING AND PROCEDURES  Imaging and Procedures for @TODAY@  OCT, Retina - OU - Both Eyes       Right Eye Quality was good. Central Foveal Thickness: 336. Progression has no prior data. 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   Left Eye Quality was good. Central Foveal Thickness: 374. Progression has no prior data. Findings include normal foveal contour, no IRF, no SRF, epiretinal membrane, retinal drusen  (Trace cystic changes; +SRF caught on widefield).   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 CME - cystoid macular edema. PED - pigment epithelial detachment. IRF - intraretinal fluid. SRF - subretinal fluid. EZ - ellipsoid zone. ERM - epiretinal membrane. ORA - outer retinal atrophy. ORT - outer retinal tubulation. SRHM - subretinal hyper-reflective material        Intravitreal Injection, Pharmacologic Agent - OS - Left Eye       Time Out 08/08/2018. 3:41 PM. Confirmed correct patient, procedure, site, and patient consented.   Anesthesia Topical anesthesia was used. Anesthetic medications included Lidocaine 2%, Proparacaine 0.5%.   Procedure Preparation included 5% betadine to ocular surface, eyelid speculum. A 30 gauge needle was used.   Injection:  1.25 mg Bevacizumab (AVASTIN) SOLN   NDC: 50242-060-01, Lot: [id number], Expiration date: 10/04/2018   Route: Intravitreal, Site: Left Eye, Waste: 0 mL  Post-op Post injection exam found visual acuity of at least counting fingers. The patient tolerated the procedure well. There were no complications. The patient received written and verbal post procedure care education.                 ASSESSMENT/PLAN:    ICD-10-CM   1. Exudative age-related macular degeneration of left eye with active choroidal neovascularization (HCC)  H35.3221 Intravitreal Injection, Pharmacologic Agent - OS - Left Eye    Bevacizumab (AVASTIN) SOLN 1.25 mg  2. Vitreous floaters of left eye  H43.392   3. Retinal edema  H35.81 OCT, Retina - OU - Both Eyes  4. 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 Essential hypertension  I10   7. Hypertensive retinopathy of both eyes  H35.033   8. Pseudophakia of both eyes  Z96.1     1-3. 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  w/ vitreous floaters  - The incidence pathology and   anatomy of wet AMD discussed   - The ANCHOR, MARINA, CATT and VIEW trials discussed with patient.    - discussed  treatment options including observation vs intravitreal anti-VEGF agents such as Avastin, Lucentis, Eylea.    - Risks of endophthalmitis and vascular occlusive events and atrophic changes discussed with patient  - exam shows large peripheral temporal subretinal hemorrhage OS -- spanning -- also mild vitreous opacities and condensations that are causing symptomatic floaters -- likely vitreous heme from temporal subretinal heme  - OCT shows trace cystic changes; +SRF caught on widefield  - recommend IVA OS #1 today, 08.05.20  - pt wishes to be treated with IVA  - RBA of procedure discussed, questions answered  - informed consent obtained and signed  - see procedure note  - f/u in 4 wks, sooner prn -- DFE/OCT/possible injection  4. Epiretinal membrane, OU  - The natural history, anatomy, potential for loss of vision, and treatment options including vitrectomy techniques and the complications of endophthalmitis, retinal detachment, vitreous hemorrhage, cataract progression and permanent vision loss discussed with the patient.  - mild ERM OU  - asymptomatic, no metamorphopsia  - no indication for surgery at this time  - monitor for now  5. Retinoschisis OD  - bullous schisis cavity, IT quadrant, from 0700-0830, extends posteriorly almost to arcades  - no associated RT/RD  - discussed findings and prognosis  - recommend monitoring for now  - if progressing posteriorly, consider laser retinopexy  6,7. Hypertensive retinopathy OU  - discussed importance of tight BP control  - monitor  8. Pseudophakia OU  - s/p CE/IOL OU  - beautiful surgery, doing well  - monitor   Ophthalmic Meds Ordered this visit:  Meds ordered this encounter  Medications  . Bevacizumab (AVASTIN) SOLN 1.25 mg       Return in about 4 weeks (around 09/05/2018) for f/u exu ARMD OS, DFE, OCT.  There are no Patient Instructions on file for this visit.   Explained the diagnoses, plan, and follow up with the patient  and they expressed understanding.  Patient expressed understanding of the importance of proper follow up care.  This document serves as a record of services personally performed by Gardiner Sleeper, MD, PhD. It was created on their behalf by Ernest Mallick, OA, an ophthalmic assistant. The creation of this record is the provider's dictation and/or activities during the visit.    Electronically signed by: Ernest Mallick, OA  08.04.2020 5:44 PM     Gardiner Sleeper, M.D., Ph.D. Diseases & Surgery of the Retina and Vitreous [facility]  I have reviewed the above documentation for accuracy and completeness, and I agree with the above. Gardiner Sleeper, M.D., Ph.D. 08/08/18 5:44 PM     Abbreviations: M myopia (nearsighted); A astigmatism; H hyperopia (farsighted); P presbyopia; Mrx spectacle prescription;  CTL contact lenses; OD right eye; OS left eye; OU both eyes  XT exotropia; ET esotropia; PEK punctate epithelial keratitis; PEE punctate epithelial erosions; DES dry eye syndrome; MGD meibomian gland dysfunction; ATs artificial tears; PFAT's preservative free artificial tears; [place] nuclear sclerotic cataract; PSC posterior subcapsular cataract; ERM epi-retinal membrane; PVD posterior vitreous detachment; RD retinal detachment; DM diabetes mellitus; DR diabetic retinopathy; NPDR non-proliferative diabetic retinopathy; PDR proliferative diabetic retinopathy; CSME clinically significant macular edema; DME diabetic macular edema; dbh dot blot hemorrhages; CWS cotton wool spot; POAG primary open angle glaucoma; C/D cup-to-disc ratio; HVF humphrey visual field; GVF goldmann visual field; OCT optical  coherence tomography; IOP intraocular pressure; BRVO Branch retinal vein occlusion; CRVO central retinal vein occlusion; CRAO central retinal artery occlusion; BRAO branch retinal artery occlusion; RT retinal tear; SB scleral buckle; PPV pars plana vitrectomy; VH Vitreous hemorrhage; PRP panretinal  laser photocoagulation; IVK intravitreal kenalog; VMT vitreomacular traction; MH Macular hole;  NVD neovascularization of the disc; NVE neovascularization elsewhere; AREDS age related eye disease study; ARMD age related macular degeneration; POAG primary open angle glaucoma; EBMD epithelial/anterior basement membrane dystrophy; ACIOL anterior chamber intraocular lens; IOL intraocular lens; PCIOL posterior chamber intraocular lens; Phaco/IOL phacoemulsification with intraocular lens placement; PRK photorefractive keratectomy; LASIK laser assisted in situ keratomileusis; HTN hypertension; DM diabetes mellitus; COPD chronic obstructive pulmonary disease  

## 2018-08-08 ENCOUNTER — Other Ambulatory Visit: Payer: Self-pay

## 2018-08-08 ENCOUNTER — Ambulatory Visit (INDEPENDENT_AMBULATORY_CARE_PROVIDER_SITE_OTHER): Payer: Medicare Other | Admitting: Ophthalmology

## 2018-08-08 ENCOUNTER — Encounter (INDEPENDENT_AMBULATORY_CARE_PROVIDER_SITE_OTHER): Payer: Self-pay | Admitting: Ophthalmology

## 2018-08-08 DIAGNOSIS — H35033 Hypertensive retinopathy, bilateral: Secondary | ICD-10-CM

## 2018-08-08 DIAGNOSIS — H35373 Puckering of macula, bilateral: Secondary | ICD-10-CM

## 2018-08-08 DIAGNOSIS — H353221 Exudative age-related macular degeneration, left eye, with active choroidal neovascularization: Secondary | ICD-10-CM | POA: Diagnosis not present

## 2018-08-08 DIAGNOSIS — Z961 Presence of intraocular lens: Secondary | ICD-10-CM | POA: Diagnosis not present

## 2018-08-08 DIAGNOSIS — I10 Essential (primary) hypertension: Secondary | ICD-10-CM | POA: Diagnosis not present

## 2018-08-08 DIAGNOSIS — H3581 Retinal edema: Secondary | ICD-10-CM

## 2018-08-08 DIAGNOSIS — H43392 Other vitreous opacities, left eye: Secondary | ICD-10-CM

## 2018-08-08 DIAGNOSIS — H33101 Unspecified retinoschisis, right eye: Secondary | ICD-10-CM

## 2018-08-08 MED ORDER — BEVACIZUMAB CHEMO INJECTION 1.25MG/0.05ML SYRINGE FOR KALEIDOSCOPE
1.2500 mg | INTRAVITREAL | Status: AC | PRN
  Administered 2018-08-08: 18:00:00 1.25 mg via INTRAVITREAL

## 2018-09-03 NOTE — Progress Notes (Signed)
[facility] Note  09/05/2018     CHIEF COMPLAINT Patient presents for Retina Follow Up   HISTORY OF PRESENT ILLNESS: Tracey Rosario is a 85 y.o. female who presents to the clinic today for:   HPI    Retina Follow Up    Patient presents with  Wet AMD.  In left eye.  This started 4 weeks ago.  Severity is moderate.  I, the attending physician,  performed the HPI with the patient and updated documentation appropriately.          Comments    Patient here for 4 weeks retina follow up for exu ARMD OS. Patient states vision doing the same. No having the little black floaters as often as did. Had some white floaters but not today. OS is kind of blurry. Bothersome. No eye pain.       Last edited by Bernarda Caffey, MD on 09/05/2018  3:36 PM. (History)    pt states she didn't really notice a change in vision after her last injection, she states in the last week or so she's noticed a white cloud in her vision, pts daughter states she takes 2 '81mg'$  aspirin a day   Referring physician: Lucretia Kern, DO [place],  [place] [id number]  HISTORICAL INFORMATION:   Selected notes from the MEDICAL RECORD NUMBER Referred by Dr. Quentin Ore for retinal eval LEE: 07.20.20 [name]) [BCVA: OD: 20/25 OS: 20/60] Ocular Hx-pseudo OU, ERM OU, DES OU, ABM, corneal dystrophy, retinoschisis, HTN ret, ptosis, conj cyst PMH-HTN, HLD, headaches, anxiety   CURRENT MEDICATIONS: Current Outpatient Medications (Ophthalmic Drugs)  Medication Sig  . fluorometholone (FML) 0.1 % ophthalmic suspension Place 1 drop into the left eye 2 (two) times daily.  . RESTASIS 0.05 % ophthalmic emulsion Place 1 drop into the left eye 2 (two) times daily.   No current facility-administered medications for this visit.  (Ophthalmic Drugs)   Current Outpatient Medications (Other)  Medication Sig  . amLODipine (NORVASC) 2.5 MG tablet TAKE 1 TABLET DAILY (NEED AN APPOINTMENT)  .  aspirin 81 MG tablet Take 162 mg by mouth daily.   Marland Kitchen atenolol (TENORMIN) 25 MG tablet TAKE 1 TABLET DAILY (NEED AN APPOINTMENT, CALL FOR VIRTUAL VISIT)  . cholecalciferol (VITAMIN D) 1000 units tablet Take 1,000 Units by mouth daily.  . citalopram (CELEXA) 20 MG tablet TAKE 1 TABLET DAILY (NEED AN APPOINTMENT)  . fluticasone (FLONASE) 50 MCG/ACT nasal spray USE 1 SPRAY IN EACH NOSTRIL DAILY (NEED APPOINTMENT)  . IRON PO Take by mouth.  Marland Kitchen lisinopril (PRINIVIL,ZESTRIL) 30 MG tablet TAKE 1 TABLET DAILY (NEED AN APPOINTMENT)  . methylPREDNISolone (MEDROL) 4 MG tablet Take as directed  . omeprazole (PRILOSEC) 20 MG capsule TAKE 1 CAPSULE DAILY  . pravastatin (PRAVACHOL) 20 MG tablet TAKE 1 TABLET DAILY (NEED AN APPOINTMENT)   No current facility-administered medications for this visit.  (Other)      REVIEW OF SYSTEMS: ROS    Positive for: Eyes   Negative for: Constitutional, Gastrointestinal, Neurological, Skin, Genitourinary, Musculoskeletal, HENT, Endocrine, Cardiovascular, Respiratory, Psychiatric, Allergic/Imm, Heme/Lymph   Last edited by Theodore Demark, COA on 09/05/2018  2:30 PM. (History)       ALLERGIES Allergies  Allergen Reactions  . Codeine Other (See Comments)    Makes her crazy    PAST MEDICAL HISTORY Past Medical History:  Diagnosis Date  . Anemia   . Cognitive decline    referred to neurologist  .  Depression 10/07/2013  . GERD (gastroesophageal reflux disease) 05/04/2013  . Hip fx ([place])    r hip, bilat pubis rami  . Hyperlipemia   . Hypertension   . Palpitations 05/04/2013   saw cardiologist in 2015, he deleted A. Fib from the record as per note not documentation to support   Past Surgical History:  Procedure Laterality Date  . ABDOMINAL HYSTERECTOMY  1971  . BLADDER SURGERY  2010  . Broken Pelvis  2015  . C-EYE SURGERY PROCEDURE Bilateral    zaldivar  . CATARACT EXTRACTION Bilateral    Done in [place]  . HIP PINNING,CANNULATED Right 10/07/2013    Procedure: CANNULATED screws right hip;  Surgeon: Mcarthur Rossetti, MD;  Location: [facility];  Service: Orthopedics;  Laterality: Right;    FAMILY HISTORY Family History  Problem Relation Age of Onset  . Prostate cancer Father   . Cancer Brother   . Cancer Sister   . Heart attack Sister   . Cancer Sister     SOCIAL HISTORY Social History   Tobacco Use  . Smoking status: Former Smoker    Types: Cigarettes    Quit date: 08/02/1970    Years since quitting: 48.1  . Smokeless tobacco: Never Used  . Tobacco comment: "smoked very little"  Substance Use Topics  . Alcohol use: No    Alcohol/week: 0.0 standard drinks  . Drug use: No         OPHTHALMIC EXAM:  Base Eye Exam    Visual Acuity (Snellen - Linear)      Right Left   Dist [place] 20/30 -2 20/80 -1   Dist ph [place] NI 20/60 -2       Tonometry (Tonopen, 2:26 PM)      Right Left   Pressure 12 13       Pupils      Dark Light Shape React APD   Right 3 2 Round Brisk None   Left 4 3 Round Brisk None       Visual Fields (Counting fingers)      Left Right    Full Full       Extraocular Movement      Right Left    Full, Ortho Full, Ortho       Neuro/Psych    Oriented x3: Yes   Mood/Affect: Normal       Dilation    Both eyes: 1.0% Mydriacyl, 2.5% Phenylephrine @ 2:26 PM        Slit Lamp and Fundus Exam    Slit Lamp Exam      Right Left   Lids/Lashes Dermatochalasis - upper lid Dermatochalasis - upper lid   Conjunctiva/Sclera White and quiet White and quiet   Cornea Arcus, 1+ Punctate epithelial erosions, mild EBMD and corneal haze Arcus, 1+ Punctate epithelial erosions, EBMD, irregular epi   Anterior Chamber Deep and quiet Deep and quiet   Iris Round and dilated Round and moderately dilated to 9m   Lens PC IOL in good position with open PC PC IOL in good position with open PC   Vitreous Vitreous syneresis, Posterior vitreous detachment Vitreous syneresis; PVD; vitreous condensations       Fundus Exam       Right Left   Disc Mild temporal Pallor, Peripapillary atrophy mild tilt, Mild temporal Pallor, Peripapillary atrophy   C/D Ratio 0.1 0.1   Macula Flat, Blunted foveal reflex, Epiretinal membrane, drusen, RPE mottling and clumping, No heme or edema Flat, Blunted foveal reflex,  RPE mottling and clumping, Epiretinal membrane, shallow SRF temporal macula -- serous RD   Vessels Vascular attenuation, Tortuous Vascular attenuation, Tortuous   Periphery bullous IT schisis cavity from 0700-0800 extending posteriorly almost to arcades otherwise attached, focal pigmented CR scar at 0630 Large sub-retinal hemorrhage temporally spanning from 0200-0430; shallow SRF tracking posteriorly from [place] and Procedures for '@TODAY'$ @  OCT, Retina - OU - Both Eyes       Right Eye Quality was good. Central Foveal Thickness: 340. Progression has been stable. 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   Left Eye Quality was good. Central Foveal Thickness: 343. Progression has worsened. Findings include normal foveal contour, no IRF, epiretinal membrane, retinal drusen , preretinal fibrosis, subretinal fluid (Interval development of temporal SRF with ragged photoreceptors, ?serous RD emanating from PED in temporal periphery).   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 CME - cystoid macular edema. PED - pigment epithelial detachment. IRF - intraretinal fluid. SRF - subretinal fluid. EZ - ellipsoid zone. ERM - epiretinal membrane. ORA - outer retinal atrophy. ORT - outer retinal  tubulation. SRHM - subretinal hyper-reflective material        Intravitreal Injection, Pharmacologic Agent - OS - Left Eye       Time Out 09/05/2018. 4:31 PM. Confirmed correct patient, procedure, site, and patient consented.   Anesthesia Topical anesthesia was used. Anesthetic medications included Lidocaine 2%, Proparacaine 0.5%.   Procedure Preparation included 5% betadine to ocular surface, eyelid speculum. A 30 gauge needle was used.   Injection:  2 mg aflibercept Alfonse Flavors) SOLN   NDC: 16109-604-54, Lot: [id number], Expiration date: 01/02/2019   Route: Intravitreal, Site: Left Eye, Waste: 0.05 mL  Post-op Post injection exam found visual acuity of at least counting fingers. The patient tolerated the procedure well. There were no complications. The patient received written and verbal post procedure care education.   Notes **SAMPLE MEDICATION ADMINISTERED**                 ASSESSMENT/PLAN:    ICD-10-CM   1. Exudative age-related macular degeneration of left eye with active choroidal neovascularization (HCC)  H35.3221 Intravitreal Injection, Pharmacologic Agent - OS - Left Eye    aflibercept (EYLEA) SOLN 2 mg  2. Serous retinal detachment of left eye  H33.22   3. Retinal edema  H35.81 OCT, Retina - OU - Both Eyes  4. 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 Essential hypertension  I10   7. Hypertensive retinopathy of both eyes  H35.033   8. Pseudophakia of both eyes  Z96.1     1-3. 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 now w/ shallow SRF/serous RD  - initially presented with symptomatic floaters OS  - initial exam showed large peripheral temporal subretinal hemorrhage OS -- spanning 2-430 -- also mild vitreous opacities and condensations that were causing symptomatic floaters  - s/p IVA OS #1 (08.05.20) for subretinal hemorrhage, possible peripheral CNV   - today BCVA remains stable at 20/60-2 OS  -  exam shows interval posterior extension of subretinal heme and new shallow SRF extending posteriorly from temporal lesion  - OCT shows confirms development of temporal SRF with ragged photoreceptors, serous or exudative RD emanating from peripheral temporal lesion  - recommend IVE OS #1 today, 09.02.20 -- due to interval development of SRF -- will give sample medication  - pt wishes to be treated with IVE OS #1 (sample)  - RBA of procedure discussed, questions answered  - informed consent obtained and signed  - see procedure note  - Eylea4U Benefits investigation initiated 9.2.2020  - f/u in 2 wks, sooner prn -- DFE/OCT/FA transit OS  4. Epiretinal membrane, OU  - The natural history, anatomy, potential for loss of vision, and treatment options including vitrectomy techniques and the complications of endophthalmitis, retinal detachment, vitreous hemorrhage, cataract progression and permanent vision loss discussed with the patient.  - mild ERM OU  - asymptomatic, no metamorphopsia  - no indication for surgery at this time  - monitor for now  5. Retinoschisis OD  - bullous schisis cavity, IT quadrant, from 0700-0830, extends posteriorly almost to arcades  - no associated RT/RD  - stable from prior  - discussed findings and prognosis  - recommend monitoring for now  - if progressing posteriorly, consider laser retinopexy  6,7. Hypertensive retinopathy OU  - discussed importance of tight BP control  - monitor  8. Pseudophakia OU  - s/p CE/IOL OU  - beautiful surgery, doing well  - monitor   Ophthalmic Meds Ordered this visit:  Meds ordered this encounter  Medications  . aflibercept (EYLEA) SOLN 2 mg       Return in about 2 weeks (around 09/19/2018).  There are no Patient Instructions on file for this visit.   Explained the diagnoses, plan, and follow up with the patient and they expressed understanding.  Patient expressed understanding of the importance of proper follow up  care.  This document serves as a record of services personally performed by Gardiner Sleeper, MD, PhD. It was created on their behalf by Roselee Nova, COMT. The creation of this record is the provider's dictation and/or activities during the visit.  Electronically signed by: Roselee Nova, COMT 09/05/18 10:42 PM   Gardiner Sleeper, M.D., Ph.D. Diseases & Surgery of the Retina and Vitreous [facility]  I have reviewed the above documentation for accuracy and completeness, and I agree with the above. Gardiner Sleeper, M.D., Ph.D. 09/05/18 10:42 PM    Abbreviations: M myopia (nearsighted); A astigmatism; H hyperopia (farsighted); P presbyopia; Mrx spectacle prescription;  CTL contact lenses; OD right eye; OS left eye; OU both eyes  XT exotropia; ET esotropia; PEK punctate epithelial keratitis; PEE punctate epithelial erosions; DES dry eye syndrome; MGD meibomian gland dysfunction; ATs artificial tears; PFAT's preservative free artificial tears; [place] nuclear sclerotic cataract; PSC posterior subcapsular cataract; ERM epi-retinal membrane; PVD posterior vitreous detachment; RD retinal detachment; DM diabetes mellitus; DR diabetic retinopathy; NPDR non-proliferative diabetic retinopathy; PDR proliferative diabetic retinopathy; CSME clinically significant macular edema; DME diabetic macular edema; dbh dot blot hemorrhages; CWS cotton wool spot; POAG primary open angle glaucoma; C/D cup-to-disc ratio; HVF humphrey visual field; GVF goldmann visual field; OCT optical coherence tomography; IOP intraocular pressure; BRVO Branch retinal vein occlusion; CRVO central retinal vein occlusion; CRAO central retinal artery occlusion; BRAO branch retinal artery occlusion; RT retinal tear; SB scleral buckle; PPV pars plana vitrectomy; VH Vitreous hemorrhage; PRP panretinal laser photocoagulation; IVK intravitreal kenalog; VMT vitreomacular  traction; MH Macular hole;  NVD neovascularization of the disc;  NVE neovascularization elsewhere; AREDS age related eye disease study; ARMD age related macular degeneration; POAG primary open angle glaucoma; EBMD epithelial/anterior basement membrane dystrophy; ACIOL anterior chamber intraocular lens; IOL intraocular lens; PCIOL posterior chamber intraocular lens; Phaco/IOL phacoemulsification with intraocular lens placement; [place] photorefractive keratectomy; LASIK laser assisted in situ keratomileusis; HTN hypertension; DM diabetes mellitus; COPD chronic obstructive pulmonary disease

## 2018-09-05 ENCOUNTER — Other Ambulatory Visit: Payer: Self-pay

## 2018-09-05 ENCOUNTER — Ambulatory Visit (INDEPENDENT_AMBULATORY_CARE_PROVIDER_SITE_OTHER): Payer: Medicare Other | Admitting: Ophthalmology

## 2018-09-05 ENCOUNTER — Encounter (INDEPENDENT_AMBULATORY_CARE_PROVIDER_SITE_OTHER): Payer: Self-pay | Admitting: Ophthalmology

## 2018-09-05 DIAGNOSIS — H35373 Puckering of macula, bilateral: Secondary | ICD-10-CM

## 2018-09-05 DIAGNOSIS — I10 Essential (primary) hypertension: Secondary | ICD-10-CM

## 2018-09-05 DIAGNOSIS — H3322 Serous retinal detachment, left eye: Secondary | ICD-10-CM | POA: Diagnosis not present

## 2018-09-05 DIAGNOSIS — H33101 Unspecified retinoschisis, right eye: Secondary | ICD-10-CM

## 2018-09-05 DIAGNOSIS — H353221 Exudative age-related macular degeneration, left eye, with active choroidal neovascularization: Secondary | ICD-10-CM

## 2018-09-05 DIAGNOSIS — Z961 Presence of intraocular lens: Secondary | ICD-10-CM

## 2018-09-05 DIAGNOSIS — H35033 Hypertensive retinopathy, bilateral: Secondary | ICD-10-CM

## 2018-09-05 DIAGNOSIS — H3581 Retinal edema: Secondary | ICD-10-CM

## 2018-09-05 MED ORDER — AFLIBERCEPT 2MG/0.05ML IZ SOLN FOR KALEIDOSCOPE
2.0000 mg | INTRAVITREAL | Status: AC | PRN
  Administered 2018-09-05: 2 mg via INTRAVITREAL

## 2018-09-20 NOTE — Progress Notes (Signed)
[facility] Note  09/21/2018     CHIEF COMPLAINT Patient presents for Retina Follow Up   HISTORY OF PRESENT ILLNESS: Tracey Rosario is a 85 y.o. female who presents to the clinic today for:   HPI    Retina Follow Up    Patient presents with  Wet AMD.  In left eye.  Severity is moderate.  Duration of 2 weeks.  Since onset it is gradually worsening.  I, the attending physician,  performed the HPI with the patient and updated documentation appropriately.          Comments    Patient states vision cloudy OS. On Restasis bid OU. No other eye gtts. History of melanomas on upper arm and upper chest in 2012/2013. Also history of basal cell removed from nose and top of head. Patient checked with dermatologist since last visit per Dr. Coralyn Rosario.        Last edited by Tracey Caffey, MD on 09/21/2018  3:51 PM. (History)    pt reports that her left eye vision feels blurry   Referring physician: Hortencia Pilar, MD [place],  [place] [address]  HISTORICAL INFORMATION:   Selected notes from the MEDICAL RECORD NUMBER Referred by Dr. Quentin Rosario for retinal eval LEE: 07.20.20 Tracey Rosario) [BCVA: OD: 20/25 OS: 20/60] Ocular Hx-pseudo OU, ERM OU, DES OU, ABM, corneal dystrophy, retinoschisis, HTN ret, ptosis, conj cyst PMH-HTN, HLD, headaches, anxiety   CURRENT MEDICATIONS: Current Outpatient Medications (Ophthalmic Drugs)  Medication Sig  . RESTASIS 0.05 % ophthalmic emulsion Place 1 drop into the left eye 2 (two) times daily.  . fluorometholone (FML) 0.1 % ophthalmic suspension Place 1 drop into the left eye 2 (two) times daily.   No current facility-administered medications for this visit.  (Ophthalmic Drugs)   Current Outpatient Medications (Other)  Medication Sig  . amLODipine (NORVASC) 2.5 MG tablet TAKE 1 TABLET DAILY (NEED AN APPOINTMENT)  . aspirin 81 MG tablet Take 162 mg by mouth daily.   Marland Kitchen atenolol (TENORMIN) 25 MG tablet  TAKE 1 TABLET DAILY (NEED AN APPOINTMENT, CALL FOR VIRTUAL VISIT)  . cholecalciferol (VITAMIN D) 1000 units tablet Take 1,000 Units by mouth daily.  . citalopram (CELEXA) 20 MG tablet TAKE 1 TABLET DAILY (NEED AN APPOINTMENT)  . fluticasone (FLONASE) 50 MCG/ACT nasal spray USE 1 SPRAY IN EACH NOSTRIL DAILY (NEED APPOINTMENT)  . IRON PO Take by mouth.  Marland Kitchen lisinopril (PRINIVIL,ZESTRIL) 30 MG tablet TAKE 1 TABLET DAILY (NEED AN APPOINTMENT)  . methylPREDNISolone (MEDROL) 4 MG tablet Take as directed  . omeprazole (PRILOSEC) 20 MG capsule TAKE 1 CAPSULE DAILY  . pravastatin (PRAVACHOL) 20 MG tablet TAKE 1 TABLET DAILY (NEED AN APPOINTMENT)   No current facility-administered medications for this visit.  (Other)      REVIEW OF SYSTEMS: ROS    Positive for: Eyes   Negative for: Constitutional, Gastrointestinal, Neurological, Skin, Genitourinary, Musculoskeletal, HENT, Endocrine, Cardiovascular, Respiratory, Psychiatric, Allergic/Imm, Heme/Lymph   Last edited by Tracey Rosario D, COT on 09/21/2018  1:11 PM. (History)       ALLERGIES Allergies  Allergen Reactions  . Codeine Other (See Comments)    Makes her crazy    PAST MEDICAL HISTORY Past Medical History:  Diagnosis Date  . Anemia   . Cognitive decline    referred to neurologist  . Depression 10/07/2013  . GERD (gastroesophageal reflux disease) 05/04/2013  . Hip fx ([place])    r hip, bilat pubis  rami  . Hyperlipemia   . Hypertension   . Palpitations 05/04/2013   saw cardiologist in 2015, he deleted A. Fib from the record as per note not documentation to support   Past Surgical History:  Procedure Laterality Date  . ABDOMINAL HYSTERECTOMY  1971  . BLADDER SURGERY  2010  . Broken Pelvis  2015  . C-EYE SURGERY PROCEDURE Bilateral    Tracey Rosario  . CATARACT EXTRACTION Bilateral    Done in [place]  . HIP PINNING,CANNULATED Right 10/07/2013   Procedure: CANNULATED screws right hip;  Surgeon: Tracey Rossetti, MD;  Location: [facility];   Service: Orthopedics;  Laterality: Right;    FAMILY HISTORY Family History  Problem Relation Age of Onset  . Prostate cancer Father   . Cancer Brother   . Cancer Sister   . Heart attack Sister   . Cancer Sister     SOCIAL HISTORY Social History   Tobacco Use  . Smoking status: Former Smoker    Types: Cigarettes    Quit date: 08/02/1970    Years since quitting: 48.1  . Smokeless tobacco: Never Used  . Tobacco comment: "smoked very little"  Substance Use Topics  . Alcohol use: No    Alcohol/week: 0.0 standard drinks  . Drug use: No         OPHTHALMIC EXAM:  Base Eye Exam    Visual Acuity (Snellen - Linear)      Right Left   Dist [place] 20/25 +1 20/100   Dist ph [place]  20/60 +1       Tonometry (Tonopen, 1:23 PM)      Right Left   Pressure 15 13       Pupils      Dark Light Shape React APD   Right 3 2 Round Brisk None   Left 4 3 Round Brisk None       Visual Fields (Counting fingers)      Left Right    Full Full       Extraocular Movement      Right Left    Full, Ortho Full, Ortho       Neuro/Psych    Oriented x3: Yes   Mood/Affect: Normal        Slit Lamp and Fundus Exam    Slit Lamp Exam      Right Left   Lids/Lashes Dermatochalasis - upper lid Dermatochalasis - upper lid   Conjunctiva/Sclera White and quiet White and quiet   Cornea Arcus, 1+ Punctate epithelial erosions, mild EBMD and corneal haze Arcus, 1+ Punctate epithelial erosions, EBMD, irregular epi   Anterior Chamber Deep and quiet Deep and quiet   Iris Round and dilated Round and moderately dilated to 59m   Lens PC IOL in good position with open PC PC IOL in good position with open PC   Vitreous Vitreous syneresis, Posterior vitreous detachment Vitreous syneresis; PVD; vitreous condensations       Fundus Exam      Right Left   Disc Mild temporal Pallor, Peripapillary atrophy mild tilt, Mild temporal Pallor, Peripapillary atrophy   C/D Ratio 0.1 0.1   Macula Flat, Blunted foveal  reflex, Epiretinal membrane, drusen, RPE mottling and clumping, No heme or edema Flat, Blunted foveal reflex, RPE mottling and clumping, Epiretinal membrane, blot hemes superior macula, shallow SRF temporal macula -- serous RD   Vessels Vascular attenuation, Tortuous Vascular attenuation, Tortuous   Periphery bullous IT schisis cavity from 0700-0830 extending posteriorly almost to arcades otherwise  attached, focal pigmented CR scar at 0630 Large sub-retinal hemorrhage temporally spanning from 0200-0430; shallow SRF tracking posteriorly from [facility]        Refraction    Manifest Refraction      Sphere Cylinder Axis Dist VA   Right       Left -0.25 +0.50 075 20/60          IMAGING AND PROCEDURES  Imaging and Procedures for _0 @  OCT, Retina - OU - Both Eyes       Right Eye Quality was good. Central Foveal Thickness: 341. Progression has been stable. Findings include epiretinal membrane, normal foveal contour, no IRF, no SRF, retinal drusen .   Left Eye Quality was good. Central Foveal Thickness: 339. Progression has worsened. Findings include no IRF, epiretinal membrane, retinal drusen , preretinal fibrosis, subretinal fluid, abnormal foveal contour (Interval improvement in SRF volume, but interval posterior progression of SRF extenstion ?serous RD emanating from PED in temporal perphery).   Notes *Images captured and stored on drive  Diagnosis / Impression:  NFP, no IRF/SRF centrally OU [id number] improvement in SRF volume in temporal periphery, but interval progression of SRF -- shallow posterior extension into temporal macula -- serous RD emanating from PED in temporal perphery  Clinical management:  See below  Abbreviations: NFP - Normal foveal profile. CME - cystoid macular edema. PED - pigment epithelial detachment. IRF - intraretinal fluid. SRF - subretinal fluid. EZ - ellipsoid zone. ERM - epiretinal membrane. ORA - outer retinal atrophy. ORT - outer retinal tubulation.  SRHM - subretinal hyper-reflective material        Fluorescein Angiography Optos (Transit OS)       Right Eye   Progression has no prior data. Early phase findings include staining, normal observations. Mid/Late phase findings include staining, normal observations (Temporal peripapillary staining).   Left Eye   Progression has no prior data. Early phase findings include blockage, staining. Mid/Late phase findings include window defect, blockage, staining (?late leakage overlying subretinal heme).   Notes Images stored on drive;   Impression: OD: normal study; mild peripapillary staining OS: blockage temporal periphery corresponding to subretinal heme; ?late leakage overlying SRH         B-Scan Ultrasound - OS - Left Eye       Quality was good. Findings included mass, vitreous opacities, posterior vitreous detachment.   Notes **Images stored on drive**  Impression: OS: hyperreflective mass 0300 far periphery; mild vitreous opacities consistent with PVD                 ASSESSMENT/PLAN:    ICD-10-CM   1. Exudative age-related macular degeneration of left eye with active choroidal neovascularization (HCC)  H35.3221 CANCELED: Intravitreal Injection, Pharmacologic Agent - OS - Left Eye  2. Subretinal hemorrhage of left eye  H35.62 B-Scan Ultrasound - OS - Left Eye  3. Serous retinal detachment of left eye  H33.22 B-Scan Ultrasound - OS - Left Eye  4. Retinal edema  H35.81 OCT, Retina - OU - Both Eyes  5. 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 Essential hypertension  I10   8. Hypertensive retinopathy of both eyes  H35.033 Fluorescein Angiography Optos (Transit OS)  9. Pseudophakia of both eyes  Z96.1     1-4. 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 w/ shallow SRF/serous RD  - initially presented with symptomatic floaters OS  - initial exam showed large peripheral temporal subretinal hemorrhage  OS  -- spanning 2-430 -- also mild vitreous opacities and condensations that were causing symptomatic floaters  - s/p IVA OS #1 (08.05.20) for subretinal hemorrhage, possible peripheral CNV  - s/p IVE OS #1 (09.02.20) - sample   - today BCVA remains stable at 20/60+1 OS  - exam shows interval posterior extension of subretinal heme and persistent shallow SRF extending posteriorly from temporal lesion  - OCT shows interval improvement in temporal SRF with ragged photoreceptors; mild posterior extension of SRF into temporal macula--emanating from peripheral temporal lesion  - differential includes choroidal melanoma or other subretinal mass -- pt with +history of skin melanoma  - B-scan today (9.18.20) shows hyperreflective, ill-defined mass, 0300 periphery OS  - will refer to Dr. Daralene Milch for evaluation of temporal, hemorrhagic mass OS -- r/o choroidal melanoma  - [employer] investigation initiated 9.2.2020 -- approved as of 09.18.20  - f/u in 3 wks, sooner prn, OCT/DFE/possible injection  5. Epiretinal membrane, OU  - The natural history, anatomy, potential for loss of vision, and treatment options including vitrectomy techniques and the complications of endophthalmitis, retinal detachment, vitreous hemorrhage, cataract progression and permanent vision loss discussed with the patient.  - mild ERM OU  - asymptomatic, no metamorphopsia  - no indication for surgery at this time  - monitor for now  6. Retinoschisis OD -- stable  - bullous schisis cavity, IT quadrant, from 0700-0830, extends posteriorly almost to arcades  - no associated RT/RD  - stable from prior  - discussed findings and prognosis  - recommend monitoring for now  - if progressing posteriorly, consider intervention / laser retinopexy  7,8. Hypertensive retinopathy OU  - discussed importance of tight BP control  - monitor  9. Pseudophakia OU  - s/p CE/IOL OU  - beautiful surgery, doing well  - monitor   Ophthalmic  Meds Ordered this visit:  No orders of the defined types were placed in this encounter.      Return in about 3 weeks (around 10/12/2018) for f/u exu ARMD OS, DFE, OCT.  There are no Patient Instructions on file for this visit.   Explained the diagnoses, plan, and follow up with the patient and they expressed understanding.  Patient expressed understanding of the importance of proper follow up care.  This document serves as a record of services personally performed by Gardiner Sleeper, MD, PhD. It was created on their behalf by Ernest Mallick, OA, an ophthalmic assistant. The creation of this record is the provider's dictation and/or activities during the visit.    Electronically signed by: Ernest Mallick, OA  09.17.2020 4:18 PM    Gardiner Sleeper, M.D., Ph.D. Diseases & Surgery of the Retina and Vitreous [facility]  I have reviewed the above documentation for accuracy and completeness, and I agree with the above. Gardiner Sleeper, M.D., Ph.D. 09/21/18 4:18 PM    Abbreviations: M myopia (nearsighted); A astigmatism; H hyperopia (farsighted); P presbyopia; Mrx spectacle prescription;  CTL contact lenses; OD right eye; OS left eye; OU both eyes  XT exotropia; ET esotropia; PEK punctate epithelial keratitis; PEE punctate epithelial erosions; DES dry eye syndrome; MGD meibomian gland dysfunction; ATs artificial tears; PFAT's preservative free artificial tears; [place] nuclear sclerotic cataract; PSC posterior subcapsular cataract; ERM epi-retinal membrane; PVD posterior vitreous detachment; RD retinal detachment; DM diabetes mellitus; DR diabetic retinopathy; NPDR non-proliferative diabetic retinopathy; PDR proliferative diabetic retinopathy; CSME clinically significant macular edema; DME diabetic macular edema; dbh dot blot hemorrhages; CWS cotton  wool spot; POAG primary open angle glaucoma; C/D cup-to-disc ratio; HVF humphrey visual field; GVF goldmann visual field; OCT optical  coherence tomography; IOP intraocular pressure; BRVO Branch retinal vein occlusion; CRVO central retinal vein occlusion; CRAO central retinal artery occlusion; BRAO branch retinal artery occlusion; RT retinal tear; SB scleral buckle; PPV pars plana vitrectomy; VH Vitreous hemorrhage; PRP panretinal laser photocoagulation; IVK intravitreal kenalog; VMT vitreomacular traction; MH Macular hole;  NVD neovascularization of the disc; NVE neovascularization elsewhere; AREDS age related eye disease study; ARMD age related macular degeneration; POAG primary open angle glaucoma; EBMD epithelial/anterior basement membrane dystrophy; ACIOL anterior chamber intraocular lens; IOL intraocular lens; PCIOL posterior chamber intraocular lens; Phaco/IOL phacoemulsification with intraocular lens placement; [place] photorefractive keratectomy; LASIK laser assisted in situ keratomileusis; HTN hypertension; DM diabetes mellitus; COPD chronic obstructive pulmonary disease

## 2018-09-21 ENCOUNTER — Other Ambulatory Visit: Payer: Self-pay

## 2018-09-21 ENCOUNTER — Encounter (INDEPENDENT_AMBULATORY_CARE_PROVIDER_SITE_OTHER): Payer: Self-pay | Admitting: Ophthalmology

## 2018-09-21 ENCOUNTER — Ambulatory Visit (INDEPENDENT_AMBULATORY_CARE_PROVIDER_SITE_OTHER): Payer: Medicare Other | Admitting: Ophthalmology

## 2018-09-21 DIAGNOSIS — Z961 Presence of intraocular lens: Secondary | ICD-10-CM | POA: Diagnosis not present

## 2018-09-21 DIAGNOSIS — H3562 Retinal hemorrhage, left eye: Secondary | ICD-10-CM | POA: Diagnosis not present

## 2018-09-21 DIAGNOSIS — H353221 Exudative age-related macular degeneration, left eye, with active choroidal neovascularization: Secondary | ICD-10-CM

## 2018-09-21 DIAGNOSIS — H3322 Serous retinal detachment, left eye: Secondary | ICD-10-CM

## 2018-09-21 DIAGNOSIS — H35033 Hypertensive retinopathy, bilateral: Secondary | ICD-10-CM

## 2018-09-21 DIAGNOSIS — I10 Essential (primary) hypertension: Secondary | ICD-10-CM | POA: Diagnosis not present

## 2018-09-21 DIAGNOSIS — H33101 Unspecified retinoschisis, right eye: Secondary | ICD-10-CM | POA: Diagnosis not present

## 2018-09-21 DIAGNOSIS — H35373 Puckering of macula, bilateral: Secondary | ICD-10-CM

## 2018-09-21 DIAGNOSIS — H3581 Retinal edema: Secondary | ICD-10-CM | POA: Diagnosis not present

## 2018-09-25 DIAGNOSIS — H3562 Retinal hemorrhage, left eye: Secondary | ICD-10-CM | POA: Diagnosis not present

## 2018-09-27 DIAGNOSIS — E039 Hypothyroidism, unspecified: Secondary | ICD-10-CM | POA: Diagnosis not present

## 2018-09-27 DIAGNOSIS — D509 Iron deficiency anemia, unspecified: Secondary | ICD-10-CM | POA: Diagnosis not present

## 2018-09-27 DIAGNOSIS — E7800 Pure hypercholesterolemia, unspecified: Secondary | ICD-10-CM | POA: Diagnosis not present

## 2018-09-27 DIAGNOSIS — Z131 Encounter for screening for diabetes mellitus: Secondary | ICD-10-CM | POA: Diagnosis not present

## 2018-09-27 DIAGNOSIS — Z1159 Encounter for screening for other viral diseases: Secondary | ICD-10-CM | POA: Diagnosis not present

## 2018-09-27 DIAGNOSIS — M539 Dorsopathy, unspecified: Secondary | ICD-10-CM | POA: Diagnosis not present

## 2018-09-27 DIAGNOSIS — G4700 Insomnia, unspecified: Secondary | ICD-10-CM | POA: Diagnosis not present

## 2018-09-27 DIAGNOSIS — E559 Vitamin D deficiency, unspecified: Secondary | ICD-10-CM | POA: Diagnosis not present

## 2018-09-27 DIAGNOSIS — I10 Essential (primary) hypertension: Secondary | ICD-10-CM | POA: Diagnosis not present

## 2018-09-27 DIAGNOSIS — M129 Arthropathy, unspecified: Secondary | ICD-10-CM | POA: Diagnosis not present

## 2018-10-10 DIAGNOSIS — G4700 Insomnia, unspecified: Secondary | ICD-10-CM | POA: Diagnosis not present

## 2018-10-10 DIAGNOSIS — E039 Hypothyroidism, unspecified: Secondary | ICD-10-CM | POA: Diagnosis not present

## 2018-10-10 DIAGNOSIS — I10 Essential (primary) hypertension: Secondary | ICD-10-CM | POA: Diagnosis not present

## 2018-10-10 DIAGNOSIS — R413 Other amnesia: Secondary | ICD-10-CM | POA: Diagnosis not present

## 2018-10-12 ENCOUNTER — Encounter (INDEPENDENT_AMBULATORY_CARE_PROVIDER_SITE_OTHER): Payer: Medicare Other | Admitting: Ophthalmology

## 2018-10-15 ENCOUNTER — Encounter (INDEPENDENT_AMBULATORY_CARE_PROVIDER_SITE_OTHER): Payer: Self-pay | Admitting: Ophthalmology

## 2018-10-15 ENCOUNTER — Other Ambulatory Visit: Payer: Self-pay

## 2018-10-15 ENCOUNTER — Ambulatory Visit (INDEPENDENT_AMBULATORY_CARE_PROVIDER_SITE_OTHER): Payer: Medicare Other | Admitting: Ophthalmology

## 2018-10-15 ENCOUNTER — Encounter (INDEPENDENT_AMBULATORY_CARE_PROVIDER_SITE_OTHER): Payer: Medicare Other | Admitting: Ophthalmology

## 2018-10-15 DIAGNOSIS — H33101 Unspecified retinoschisis, right eye: Secondary | ICD-10-CM | POA: Diagnosis not present

## 2018-10-15 DIAGNOSIS — H3322 Serous retinal detachment, left eye: Secondary | ICD-10-CM

## 2018-10-15 DIAGNOSIS — H3581 Retinal edema: Secondary | ICD-10-CM

## 2018-10-15 DIAGNOSIS — H3562 Retinal hemorrhage, left eye: Secondary | ICD-10-CM | POA: Diagnosis not present

## 2018-10-15 DIAGNOSIS — H43392 Other vitreous opacities, left eye: Secondary | ICD-10-CM

## 2018-10-15 DIAGNOSIS — Z961 Presence of intraocular lens: Secondary | ICD-10-CM | POA: Diagnosis not present

## 2018-10-15 DIAGNOSIS — H353221 Exudative age-related macular degeneration, left eye, with active choroidal neovascularization: Secondary | ICD-10-CM

## 2018-10-15 DIAGNOSIS — H35373 Puckering of macula, bilateral: Secondary | ICD-10-CM | POA: Diagnosis not present

## 2018-10-15 DIAGNOSIS — H35033 Hypertensive retinopathy, bilateral: Secondary | ICD-10-CM | POA: Diagnosis not present

## 2018-10-15 DIAGNOSIS — I10 Essential (primary) hypertension: Secondary | ICD-10-CM

## 2018-10-15 MED ORDER — AFLIBERCEPT 2MG/0.05ML IZ SOLN FOR KALEIDOSCOPE
2.0000 mg | INTRAVITREAL | Status: AC | PRN
  Administered 2018-10-15: 2 mg via INTRAVITREAL

## 2018-10-15 NOTE — Progress Notes (Addendum)
[facility] Note  10/15/2018     CHIEF COMPLAINT Patient presents for Retina Follow Up   HISTORY OF PRESENT ILLNESS: Tracey Rosario is a 85 y.o. female who presents to the clinic today for:   HPI    Retina Follow Up    Patient presents with  Wet AMD.  In left eye.  This started weeks ago.  Severity is moderate.  Duration of weeks.  Since onset it is stable.  I, the attending physician,  performed the HPI with the patient and updated documentation appropriately.          Comments    Patient states her vision is about the same in both eyes.  She denies eye pain or discomfort and denies any new or worsening floaters or fol OU.       Last edited by Bernarda Caffey, MD on 10/15/2018 11:30 PM. (History)    pt reports   Referring physician: Hortencia Pilar, MD [place],  [place] [id number]  HISTORICAL INFORMATION:   Selected notes from the MEDICAL RECORD NUMBER Referred by Dr. Quentin Ore for retinal eval LEE: 07.20.20 [name]) [BCVA: OD: 20/25 OS: 20/60] Ocular Hx-pseudo OU, ERM OU, DES OU, ABM, corneal dystrophy, retinoschisis, HTN ret, ptosis, conj cyst PMH-HTN, HLD, headaches, anxiety   CURRENT MEDICATIONS: Current Outpatient Medications (Ophthalmic Drugs)  Medication Sig  . fluorometholone (FML) 0.1 % ophthalmic suspension Place 1 drop into the left eye 2 (two) times daily.  . RESTASIS 0.05 % ophthalmic emulsion Place 1 drop into the left eye 2 (two) times daily.   No current facility-administered medications for this visit.  (Ophthalmic Drugs)   Current Outpatient Medications (Other)  Medication Sig  . amLODipine (NORVASC) 2.5 MG tablet TAKE 1 TABLET DAILY (NEED AN APPOINTMENT)  . aspirin 81 MG tablet Take 162 mg by mouth daily.   Marland Kitchen atenolol (TENORMIN) 25 MG tablet TAKE 1 TABLET DAILY (NEED AN APPOINTMENT, CALL FOR VIRTUAL VISIT)  . cholecalciferol (VITAMIN D) 1000 units tablet Take 1,000 Units by mouth  daily.  . citalopram (CELEXA) 20 MG tablet TAKE 1 TABLET DAILY (NEED AN APPOINTMENT)  . fluticasone (FLONASE) 50 MCG/ACT nasal spray USE 1 SPRAY IN EACH NOSTRIL DAILY (NEED APPOINTMENT)  . IRON PO Take by mouth.  Marland Kitchen lisinopril (PRINIVIL,ZESTRIL) 30 MG tablet TAKE 1 TABLET DAILY (NEED AN APPOINTMENT)  . methylPREDNISolone (MEDROL) 4 MG tablet Take as directed  . omeprazole (PRILOSEC) 20 MG capsule TAKE 1 CAPSULE DAILY  . pravastatin (PRAVACHOL) 20 MG tablet TAKE 1 TABLET DAILY (NEED AN APPOINTMENT)   No current facility-administered medications for this visit.  (Other)      REVIEW OF SYSTEMS: ROS    Positive for: Eyes   Negative for: Constitutional, Gastrointestinal, Neurological, Skin, Genitourinary, Musculoskeletal, HENT, Endocrine, Cardiovascular, Respiratory, Psychiatric, Allergic/Imm, Heme/Lymph   Last edited by Doneen Poisson on 10/15/2018  2:29 PM. (History)       ALLERGIES Allergies  Allergen Reactions  . Codeine Other (See Comments)    Makes her crazy    PAST MEDICAL HISTORY Past Medical History:  Diagnosis Date  . Anemia   . Cognitive decline    referred to neurologist  . Depression 10/07/2013  . GERD (gastroesophageal reflux disease) 05/04/2013  . Hip fx ([place])    r hip, bilat pubis rami  . Hyperlipemia   . Hypertension   . Palpitations 05/04/2013   saw cardiologist in 2015, he deleted A. Fib from the  record as per note not documentation to support   Past Surgical History:  Procedure Laterality Date  . ABDOMINAL HYSTERECTOMY  1971  . BLADDER SURGERY  2010  . Broken Pelvis  2015  . C-EYE SURGERY PROCEDURE Bilateral    zaldivar  . CATARACT EXTRACTION Bilateral    Done in [place]  . HIP PINNING,CANNULATED Right 10/07/2013   Procedure: CANNULATED screws right hip;  Surgeon: Mcarthur Rossetti, MD;  Location: [facility];  Service: Orthopedics;  Laterality: Right;    FAMILY HISTORY Family History  Problem Relation Age of Onset  . Prostate cancer Father   .  Cancer Brother   . Cancer Sister   . Heart attack Sister   . Cancer Sister     SOCIAL HISTORY Social History   Tobacco Use  . Smoking status: Former Smoker    Types: Cigarettes    Quit date: 08/02/1970    Years since quitting: 48.2  . Smokeless tobacco: Never Used  . Tobacco comment: "smoked very little"  Substance Use Topics  . Alcohol use: No    Alcohol/week: 0.0 standard drinks  . Drug use: No         OPHTHALMIC EXAM:  Base Eye Exam    Visual Acuity (Snellen - Linear)      Right Left   Dist [place] 20/25 -2 20/50   Dist ph  NI NI       Tonometry (Tonopen, 2:32 PM)      Right Left   Pressure 14 18       Pupils      Dark Light Shape React APD   Right 3 2 Round Brisk 0   Left 3 2 Round Brisk 0       Visual Fields      Left Right    Full Full       Extraocular Movement      Right Left    Full Full       Neuro/Psych    Oriented x3: Yes   Mood/Affect: Normal       Dilation    Both eyes: 1.0% Mydriacyl, 2.5% Phenylephrine @ 2:32 PM        Slit Lamp and Fundus Exam    Slit Lamp Exam      Right Left   Lids/Lashes Dermatochalasis - upper lid Dermatochalasis - upper lid   Conjunctiva/Sclera White and quiet White and quiet   Cornea Arcus, 1+ Punctate epithelial erosions, mild EBMD and corneal haze Arcus, 1+ Punctate epithelial erosions, EBMD, irregular epi   Anterior Chamber Deep and quiet Deep and quiet   Iris Round and dilated Round and moderately dilated to 55m   Lens PC IOL in good position with open PC PC IOL in good position with open PC   Vitreous Vitreous syneresis, Posterior vitreous detachment Vitreous syneresis; PVD; vitreous condensations       Fundus Exam      Right Left   Disc Mild temporal Pallor, Peripapillary atrophy mild tilt, Mild temporal Pallor, Peripapillary atrophy   C/D Ratio 0.1 0.1   Macula Flat, Blunted foveal reflex, Epiretinal membrane, drusen, RPE mottling and clumping, No heme or edema Flat, Blunted foveal reflex, RPE  mottling and clumping, Epiretinal membrane, blot hemes superior macula, shallow SRF temporal macula -- serous RD   Vessels Vascular attenuation, Tortuous Vascular attenuation, Tortuous   Periphery bullous IT schisis cavity from 0700-0830 extending posteriorly almost to arcades otherwise attached, focal pigmented CR scar at 0630 Large sub-retinal hemorrhage temporally  spanning from 0200-0430 - heme turning dark; +SRF/SRH temporally - improved from prior          IMAGING AND PROCEDURES  Imaging and Procedures for '@TODAY'$ @  Intravitreal Injection, Pharmacologic Agent - OS - Left Eye       Time Out 10/15/2018. 3:13 PM. Confirmed correct patient, procedure, site, and patient consented.   Anesthesia Topical anesthesia was used. Anesthetic medications included Lidocaine 2%, Proparacaine 0.5%.   Procedure Preparation included 5% betadine to ocular surface, eyelid speculum. A 30 gauge needle was used.   Injection:  2 mg aflibercept Alfonse Flavors) SOLN   NDC: A3590391, Lot: [id number], Expiration date: 04/23/2019   Route: Intravitreal, Site: Left Eye, Waste: 0.05 mL  Post-op Post injection exam found visual acuity of at least counting fingers. The patient tolerated the procedure well. There were no complications. The patient received written and verbal post procedure care education.        OCT, Retina - OU - Both Eyes       Right Eye Quality was good. Central Foveal Thickness: 336. Progression has been stable. Findings include epiretinal membrane, normal foveal contour, no IRF, no SRF, retinal drusen .   Left Eye Quality was good. Central Foveal Thickness: 337. Progression has improved. Findings include no IRF, epiretinal membrane, retinal drusen , preretinal fibrosis, abnormal foveal contour, no SRF (Interval improvement in SRF -- no longer visible on macular widescan).   Notes *Images captured and stored on drive  Diagnosis / Impression:  NFP, no IRF/SRF centrally OU OS: Interval  improvement in SRF -- no longer visible on macular widescan   Clinical management:  See below  Abbreviations: NFP - Normal foveal profile. CME - cystoid macular edema. PED - pigment epithelial detachment. IRF - intraretinal fluid. SRF - subretinal fluid. EZ - ellipsoid zone. ERM - epiretinal membrane. ORA - outer retinal atrophy. ORT - outer retinal tubulation. SRHM - subretinal hyper-reflective material                 ASSESSMENT/PLAN:    ICD-10-CM   1. Exudative age-related macular degeneration of left eye with active choroidal neovascularization (HCC)  H35.3221 Intravitreal Injection, Pharmacologic Agent - OS - Left Eye    aflibercept (EYLEA) SOLN 2 mg  2. Subretinal hemorrhage of left eye  H35.62 Intravitreal Injection, Pharmacologic Agent - OS - Left Eye    aflibercept (EYLEA) SOLN 2 mg  3. Serous retinal detachment of left eye  H33.22   4. Retinal edema  H35.81 OCT, Retina - OU - Both Eyes  5. 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 Essential hypertension  I10   8. Hypertensive retinopathy of both eyes  H35.033   9. Pseudophakia of both eyes  Z96.1   10. Vitreous floaters of left eye  H43.392     1-4. 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 w/ shallow SRF/serous RD  - initially presented with symptomatic floaters OS  - initial exam showed large peripheral temporal subretinal hemorrhage OS -- spanning 2-430 -- also mild vitreous opacities and condensations that were causing symptomatic floaters  - s/p IVA OS #1 (08.05.20) for subretinal hemorrhage, possible peripheral CNV  - s/p IVE OS #1 (09.02.20) - sample   - today BCVA improved to 20/50 from  20/60+1 OS  - exam shows interval improvement in subretinal heme and shallow SRF--no longer extending posteriorly into macula  - OCT shows interval improvement in SRF -- no longer visible  on macular widescan   - differential includes choroidal melanoma or other  subretinal mass -- pt with +history of skin melanoma  - B-scan (9.18.20) showed hyperreflective, ill-defined mass, 0300 periphery OS  - was seen by Dr. Daralene Milch, Ocular Oncologist, on 9.22.2020 -- no tumor on exam or b-scan  - recommend IVE OS #2 today, 10.12.20 -- for exudative ARMD w/ peripheral subretinal heme  - pt wishes to proceed  - RBA of procedure discussed, questions answered  - informed consent obtained and signed  - see procedure note  - Eylea[employer]U Benefits investigation initiated 9.2.2020 -- approved as of 09.18.20  - f/u in 4 wks, sooner prn, OCT/DFE/possible injection  5. Epiretinal membrane, OU  - The natural history, anatomy, potential for loss of vision, and treatment options including vitrectomy techniques and the complications of endophthalmitis, retinal detachment, vitreous hemorrhage, cataract progression and permanent vision loss discussed with the patient.  - mild ERM OU  - asymptomatic, no metamorphopsia  - no indication for surgery at this time  - monitor for now  6. Retinoschisis OD -- stable  - bullous schisis cavity, IT quadrant, from 0700-0830, extends posteriorly almost to arcades  - no associated RT/RD  - stable from prior  - discussed findings and prognosis  - recommend monitoring for now  - if progressing posteriorly, consider intervention / laser retinopexy  7,8. Hypertensive retinopathy OU  - discussed importance of tight BP control  - monitor  9. Pseudophakia OU  - s/p CE/IOL OU  - beautiful surgery, doing well  - monitor   Ophthalmic Meds Ordered this visit:  Meds ordered this encounter  Medications  . aflibercept (EYLEA) SOLN 2 mg       Return in about 4 weeks (around 11/12/2018) for f/u exu ARMD OS, DFE, OCT.  There are no Patient Instructions on file for this visit.   Explained the diagnoses, plan, and follow up with the patient and they expressed understanding.  Patient expressed understanding of the importance of proper follow up  care.  This document serves as a record of services personally performed by Gardiner Sleeper, MD, PhD. It was created on their behalf by Ernest Mallick, OA, an ophthalmic assistant. The creation of this record is the provider's dictation and/or activities during the visit.    Electronically signed by: Ernest Mallick, OA 10.12.2020 11:46 PM .   Gardiner Sleeper, M.D., Ph.D. Diseases & Surgery of the Retina and Vitreous [facility]  I have reviewed the above documentation for accuracy and completeness, and I agree with the above. Gardiner Sleeper, M.D., Ph.D. 10/15/18 11:46 PM     Abbreviations: M myopia (nearsighted); A astigmatism; H hyperopia (farsighted); P presbyopia; Mrx spectacle prescription;  CTL contact lenses; OD right eye; OS left eye; OU both eyes  XT exotropia; ET esotropia; PEK punctate epithelial keratitis; PEE punctate epithelial erosions; DES dry eye syndrome; MGD meibomian gland dysfunction; ATs artificial tears; PFAT's preservative free artificial tears; [place] nuclear sclerotic cataract; PSC posterior subcapsular cataract; ERM epi-retinal membrane; PVD posterior vitreous detachment; RD retinal detachment; DM diabetes mellitus; DR diabetic retinopathy; NPDR non-proliferative diabetic retinopathy; PDR proliferative diabetic retinopathy; CSME clinically significant macular edema; DME diabetic macular edema; dbh dot blot hemorrhages; CWS cotton wool spot; POAG primary open angle glaucoma; C/D cup-to-disc ratio; HVF humphrey visual field; GVF goldmann visual field; OCT optical coherence tomography; IOP intraocular pressure; BRVO Branch retinal vein occlusion; CRVO central retinal vein occlusion; CRAO central retinal artery occlusion; BRAO branch retinal  artery occlusion; RT retinal tear; SB scleral buckle; PPV pars plana vitrectomy; VH Vitreous hemorrhage; PRP panretinal laser photocoagulation; IVK intravitreal kenalog; VMT vitreomacular traction; MH Macular hole;  NVD  neovascularization of the disc; NVE neovascularization elsewhere; AREDS age related eye disease study; ARMD age related macular degeneration; POAG primary open angle glaucoma; EBMD epithelial/anterior basement membrane dystrophy; ACIOL anterior chamber intraocular lens; IOL intraocular lens; PCIOL posterior chamber intraocular lens; Phaco/IOL phacoemulsification with intraocular lens placement; [place] photorefractive keratectomy; LASIK laser assisted in situ keratomileusis; HTN hypertension; DM diabetes mellitus; COPD chronic obstructive pulmonary disease

## 2018-11-08 ENCOUNTER — Other Ambulatory Visit: Payer: Self-pay | Admitting: *Deleted

## 2018-11-08 ENCOUNTER — Encounter: Payer: Self-pay | Admitting: *Deleted

## 2018-11-12 ENCOUNTER — Ambulatory Visit (INDEPENDENT_AMBULATORY_CARE_PROVIDER_SITE_OTHER): Payer: Medicare Other | Admitting: Neurology

## 2018-11-12 ENCOUNTER — Encounter: Payer: Self-pay | Admitting: Neurology

## 2018-11-12 ENCOUNTER — Other Ambulatory Visit: Payer: Self-pay

## 2018-11-12 VITALS — BP 155/58 | HR 48 | Temp 97.6°F | Ht 70.0 in | Wt 167.0 lb

## 2018-11-12 DIAGNOSIS — R413 Other amnesia: Secondary | ICD-10-CM | POA: Diagnosis not present

## 2018-11-12 NOTE — Patient Instructions (Signed)

## 2018-11-12 NOTE — Progress Notes (Signed)
[facility]    Provider:  Dr Lucia Gaskins Requesting Provider: Courtney Paris, NP Primary Care Provider:  Courtney Paris, NP  CC:  [facility]   HPI:  Tracey Rosario is a 85 y.o. female here as requested by [name], DO for memory loss and dizziness.  Past medical history hip fracture, pelvic fracture, hypothyroidism, multilevel degenerative disc disease, hypertension, elevated serum creatinine, high risk medication use, short-term memory loss, she is a former smoker, started smoking when she was younger quit smoking in 1972.  She was started on donepezil by pcp. Memory loss started in 2015 (Had MRI in 2017) and slowly progressive.  She has been treated by neurology in the past and saw Patrcia Dolly in 2017.  Patrcia Dolly in 2017 she reported names, sometimes conversations or dates, frequently misplacing things, occasional word finding difficulties, not getting lost driving.  At that time she lived by herself in 2017 and her daughter reported her house was perfect.  Daughter had noticed at that time as well memory changes. MOCA in 2017 was 25/30 (reviewed Dr. Yvone Neu notes).  At that time they discussed medications like donepezil and discussed the importance of control of vascular risk factors, physical exercise and brain stimulation. Daughter is here, she says patient repeats the same questions in the same day. Not getting lost, feels safe driving across the street to the store, she doesn't feel safe driving there is too much traffic. She goes to the beauty shop. She is here just for a checkup, daughter provides much information. Daughter notices some confusion. Nothing drastic just a few things like this, mostly repeating the same questions. She lives independently in a community, a development. Daughter is not concerned with her living alone. No falls. Denies dizziness. No other focal neurologic deficits, associated symptoms, inciting events or modifiable factors. She takes her medication, not  missing any.   Reviewed notes, labs and imaging from outside physicians, which showed:  I reviewed notes from [facility]: She was referred by Courtney Paris for dizziness and memory loss.  She has had neck pain.  Chronic.  Symptoms also include dizziness.  Dizziness is described as lightheadedness.  Onset was gradual 2 months prior, no known events that preceded symptom onset, the episodes occur weekly, mild and unchanged, symptoms are exacerbated a standing and getting up quickly.  History of cognitive difficulties, they started Aricept earlier this year, short-term memory impairment, noted by the patient and the patient's daughter, onset was gradual 2 years ago, no known event that preceded symptom onset, episodes daily, Aricept giving her nightmares.  Donepezil was changed to 10 mg 1 tablet in September 2020.  She has had sleep apnea screening.  Her thyroid is regularly checked, she has had sed rate, ANA with reflex, rheumatoid factor, CRP, PTH, lipid panel, ferritin, iron, checked regularly.hgba1c 5.4. CT head 05/2015 stable atrophy and chronic white matter changes.   Review of Systems: Patient complains of symptoms per HPI as well as the following symptoms: memory loss, joint pain. Pertinent negatives and positives per HPI. All others negative.   Social History   Socioeconomic History   Marital status: Widowed    Spouse name: Not on file   Number of children: 2   Years of education: Not on file   Highest education level: High school graduate  Occupational History   Occupation: Retired  Ecologist strain: Not on file   Food insecurity    Worry: Not on file  Inability: Not on file   Transportation needs    Medical: Not on file    Non-medical: Not on file  Tobacco Use   Smoking status: Former Smoker    Types: Cigarettes    Quit date: 08/02/1970    Years since quitting: 48.3   Smokeless tobacco: Never Used   Tobacco comment: "smoked very  little"  Substance and Sexual Activity   Alcohol use: Never    Alcohol/week: 0.0 standard drinks    Frequency: Never   Drug use: Never   Sexual activity: Not on file  Lifestyle   Physical activity    Days per week: Not on file    Minutes per session: Not on file   Stress: Not on file  Relationships   Social connections    Talks on phone: Not on file    Gets together: Not on file    Attends religious service: Not on file    Active member of club or organization: Not on file    Attends meetings of clubs or organizations: Not on file    Relationship status: Not on file   Intimate partner violence    Fear of current or ex partner: Not on file    Emotionally abused: Not on file    Physically abused: Not on file    Forced sexual activity: Not on file  Other Topics Concern   Not on file  Social History Narrative   Work or School: none      Home Situation: lives alone, daughter lives about 5 minutes away. One daughter lives across the street (bedridden)      Spiritual Beliefs: baptist      Lifestyle: no regular exercise, diet is ok      3 grand children and 3 great grands; 2 1/2 and 3 months       Caffeine: 1-2 cups of coffee/day    Family History  Problem Relation Age of Onset   Prostate cancer Father    Heart attack Sister    Cancer Brother    Cancer Sister    Heart attack Sister    Cancer Sister    Stroke Daughter    Dementia Neg Hx    Alzheimer's disease Neg Hx     Past Medical History:  Diagnosis Date   Acid reflux    Anemia    Arthropathy    Cognitive decline    referred to neurologist   Depression 10/07/2013   daughter didn't know anything about this   Dizziness    GERD (gastroesophageal reflux disease) 05/04/2013   Hip fx (HCC)    r hip, bilat pubis rami   Hyperlipemia    Hypertension    Hypothyroidism    Insomnia    Multilevel degenerative disc disease    Palpitations 05/04/2013   saw cardiologist in 2015, he deleted  A. Fib from the record as per note not documentation to support   Short-term memory loss    Vitamin D deficiency     Patient Active Problem List   Diagnosis Date Noted   Age-related memory disorder 11/12/2018   Other osteoporosis without current pathological fracture 01/28/2016   Mild cognitive impairment 10/15/2015   Esophageal reflux 06/11/2015   Small vessel disease (HCC) 04/30/2015   Essential hypertension 04/07/2015   Tremor 04/07/2015   Major depressive disorder with single episode, in full remission (HCC) 03/30/2015   Absolute anemia 03/30/2015   Asymptomatic PVCs 08/01/2013   Hyperlipidemia 06/04/2013    Past Surgical History:  Procedure Laterality Date   ABDOMINAL HYSTERECTOMY  1971   BLADDER SURGERY  2010   Broken Pelvis  2015   C-EYE SURGERY PROCEDURE Bilateral    zaldivar   CATARACT EXTRACTION Bilateral    Done in [place]   HIP PINNING,CANNULATED Right 10/07/2013   Procedure: CANNULATED screws right hip;  Surgeon: Kathryne Hitchhristopher Y Blackman, MD;  [facility];  Service: Orthopedics;  Laterality: Right;    Current Outpatient Medications  Medication Sig Dispense Refill   amLODipine (NORVASC) 2.5 MG tablet TAKE 1 TABLET DAILY (NEED AN APPOINTMENT) 30 tablet 0   aspirin 81 MG tablet Take 162 mg by mouth daily.      atenolol (TENORMIN) 50 MG tablet Take 25 mg by mouth daily.      Cholecalciferol (VITAMIN D3 PO) Take 5,000 Int'l Units by mouth daily.     citalopram (CELEXA) 20 MG tablet TAKE 1 TABLET DAILY (NEED AN APPOINTMENT) 30 tablet 0   cyclobenzaprine (FLEXERIL) 10 MG tablet Take 10 mg by mouth 3 (three) times daily.     donepezil (ARICEPT) 10 MG tablet Take 10 mg by mouth daily.     fluticasone (FLONASE) 50 MCG/ACT nasal spray USE 1 SPRAY IN EACH NOSTRIL DAILY (NEED APPOINTMENT) 48 g 1   levothyroxine (SYNTHROID) 50 MCG tablet Take 50 mcg by mouth daily.     lisinopril (PRINIVIL,ZESTRIL) 30 MG tablet TAKE 1 TABLET DAILY (NEED AN  APPOINTMENT) 90 tablet 0   meloxicam (MOBIC) 7.5 MG tablet      omeprazole (PRILOSEC) 20 MG capsule TAKE 1 CAPSULE DAILY 90 capsule 0   Probiotic Product (PROBIOTIC PO) Take by mouth.     ALPRAZolam (XANAX) 0.25 MG tablet Take 0.25 mg by mouth at bedtime.     fluorometholone (FML) 0.1 % ophthalmic suspension Place 1 drop into the left eye 2 (two) times daily.     methylPREDNISolone (MEDROL) 4 MG tablet Take as directed (Patient not taking: Reported on 11/12/2018) 21 tablet 0   pravastatin (PRAVACHOL) 20 MG tablet TAKE 1 TABLET DAILY (NEED AN APPOINTMENT) (Patient not taking: Reported on 11/12/2018) 90 tablet 0   RESTASIS 0.05 % ophthalmic emulsion Place 1 drop into the left eye 2 (two) times daily.     No current facility-administered medications for this visit.     Allergies as of 11/12/2018 - Review Complete 11/12/2018  Allergen Reaction Noted   Codeine Other (See Comments) 05/04/2013    Vitals: BP (!) 155/58 (BP Location: Right Arm, Patient Position: Sitting)    Pulse (!) 48    Temp 97.6 F (36.4 C) Comment: sister 97.6; both taken at front door   Ht 5\' 10"  (1.778 m)    Wt 167 lb (75.8 kg)    BMI 23.96 kg/m  Last Weight:  Wt Readings from Last 1 Encounters:  11/12/18 167 lb (75.8 kg)   Last Height:   Ht Readings from Last 1 Encounters:  11/12/18 5\' 10"  (1.778 m)     Physical exam: Exam: Gen: NAD, conversant, well nourised, well groomed                     CV: RRR, no MRG. No Carotid Bruits. No peripheral edema, warm, nontender Eyes: Conjunctivae clear without exudates or hemorrhage  Neuro: Detailed Neurologic Exam  Speech:    Speech is normal; fluent and spontaneous with normal comprehension.  Cognition:  MMSE - Mini Mental State Exam 11/12/2018 02/22/2017 04/07/2015  Orientation to time 3 5 5   Orientation to Place  4 5 4   Registration 3 3 3   Attention/ Calculation 5 1 5   Recall 3 1 3   Language- name 2 objects 2 2 2   Language- repeat 0 1 1  Language- follow 3  step command 3 3 2   Language- read & follow direction 1 1 1   Write a sentence 0 1 1  Copy design 1 0 1  Total score 25 23 28     Montreal Cognitive Assessment  10/13/2015  Visuospatial/ Executive (0/5) 5  Naming (0/3) 2  Attention: Read list of digits (0/2) 2  Attention: Read list of letters (0/1) 1  Attention: Serial 7 subtraction starting at 100 (0/3) 1  Language: Repeat phrase (0/2) 2  Language : Fluency (0/1) 0  Abstraction (0/2) 2  Delayed Recall (0/5) 4  Orientation (0/6) 5  Total 24  Adjusted Score (based on education) 25    Cranial Nerves:    The pupils are equal, round, and reactive to light.  Attempted funduscopy could not visualize.  Visual fields are full to finger confrontation. Extraocular movements are intact. Trigeminal sensation is intact and the muscles of mastication are normal. The face is symmetric. The palate elevates in the midline. Hearing intact. Voice is normal. Shoulder shrug is normal. The tongue has normal motion without fasciculations.   Coordination:    No dysmetria  Gait:    Good stride, not shuffling, not ataxic  Motor Observation:    Tremors in the upper extremities Tone:    Normal muscle tone.    Posture:    Posture is normal.     Strength:    Strength is V/V in the upper and lower limbs.      Sensation: intact to LT, +postural instability     Reflex Exam:  DTR's:    Deep tendon reflexes in the upper and lower extremities are symmetrical bilaterally.   Toes:    The toes are equiv bilaterally.   Clonus:    Clonus is absent.    Assessment/Plan:  Amazing looking, lovely 85 y.o. female here as requested by Lucretia Kern, DO for memory loss and dizziness.  Past medical history hip fracture, pelvic fracture, memory loss (was seeing Dr. Ellouise Newer at [facility] neurology in 2017 for memory loss),  hypothyroidism, multilevel degenerative disc disease, hypertension, elevated serum creatinine, high risk medication use, short-term memory  loss, she is a former smoker, started smoking when she was younger quit smoking in 1972.  She was started on donepezil by pcp. Slowly progressive memory loss since 2015.  MRI in 2017 showed chronic ischemic microvascular changes as well as atrophy but nothing extensive given her age. Denies dizziness.   HER MMSE improved from 18 months ago to 23/30 and now 25/30. MCI due to normal cognitive aging. A MOCA may reflect more loss but given she is independent, has good judgement about driving, not missing medication I think we can just follow clinically. Continue Aricept.      Cc:  Simona Huh, NP  Sarina Ill, MD  [facility] [address] [place], [place] [address]  Phone [phone] Fax [phone]

## 2018-11-13 NOTE — Progress Notes (Signed)
[facility] Note  11/14/2018     CHIEF COMPLAINT Patient presents for Retina Follow Up   HISTORY OF PRESENT ILLNESS: Tracey Rosario is a 85 y.o. female who presents to the clinic today for:   HPI    Retina Follow Up    Patient presents with  Dry AMD.  In left eye.  This started 3 months ago.  Since onset it is stable.  I, the attending physician,  performed the HPI with the patient and updated documentation appropriately.          Comments    F/U ARMDOs. Patient states she has not noticed a change in her vision, denies new visual issues/onsets She is ready for tx today if indicated        Last edited by Bernarda Caffey, MD on 11/14/2018  2:08 PM. (History)    pt reports that her vision is not getting any worse   Referring physician: Hortencia Pilar, MD [place],  [place] [address]  HISTORICAL INFORMATION:   Selected notes from the [facility] Referred by Dr. Quentin Ore for retinal eval LEE: 07.20.20 [name]) [BCVA: OD: 20/25 OS: 20/60] Ocular Hx-pseudo OU, ERM OU, DES OU, ABM, corneal dystrophy, retinoschisis, HTN ret, ptosis, conj cyst PMH-HTN, HLD, headaches, anxiety   CURRENT MEDICATIONS: Current Outpatient Medications (Ophthalmic Drugs)  Medication Sig  . fluorometholone (FML) 0.1 % ophthalmic suspension Place 1 drop into the left eye 2 (two) times daily.  . RESTASIS 0.05 % ophthalmic emulsion Place 1 drop into the left eye 2 (two) times daily.   No current facility-administered medications for this visit.  (Ophthalmic Drugs)   Current Outpatient Medications (Other)  Medication Sig  . ALPRAZolam (XANAX) 0.25 MG tablet Take 0.25 mg by mouth at bedtime.  Marland Kitchen amLODipine (NORVASC) 2.5 MG tablet TAKE 1 TABLET DAILY (NEED AN APPOINTMENT)  . aspirin 81 MG tablet Take 162 mg by mouth daily.   Marland Kitchen atenolol (TENORMIN) 50 MG tablet Take 25 mg by mouth daily.   . Cholecalciferol (VITAMIN D3 PO) Take 5,000  Int'l Units by mouth daily.  . citalopram (CELEXA) 20 MG tablet TAKE 1 TABLET DAILY (NEED AN APPOINTMENT)  . cyclobenzaprine (FLEXERIL) 10 MG tablet Take 10 mg by mouth 3 (three) times daily.  Marland Kitchen donepezil (ARICEPT) 10 MG tablet Take 10 mg by mouth daily.  . fluticasone (FLONASE) 50 MCG/ACT nasal spray USE 1 SPRAY IN EACH NOSTRIL DAILY (NEED APPOINTMENT)  . levothyroxine (SYNTHROID) 50 MCG tablet Take 50 mcg by mouth daily.  Marland Kitchen lisinopril (PRINIVIL,ZESTRIL) 30 MG tablet TAKE 1 TABLET DAILY (NEED AN APPOINTMENT)  . meloxicam (MOBIC) 7.5 MG tablet   . methylPREDNISolone (MEDROL) 4 MG tablet Take as directed  . omeprazole (PRILOSEC) 20 MG capsule TAKE 1 CAPSULE DAILY  . pravastatin (PRAVACHOL) 20 MG tablet TAKE 1 TABLET DAILY (NEED AN APPOINTMENT)  . Probiotic Product (PROBIOTIC PO) Take by mouth.   No current facility-administered medications for this visit.  (Other)      REVIEW OF SYSTEMS: ROS    Positive for: Eyes   Negative for: Constitutional, Gastrointestinal, Neurological, Skin, Genitourinary, Musculoskeletal, HENT, Endocrine, Cardiovascular, Respiratory, Psychiatric, Allergic/Imm, Heme/Lymph   Last edited by Zenovia Jordan, LPN on 42/87/6811  5:72 PM. (History)       ALLERGIES Allergies  Allergen Reactions  . Codeine Other (See Comments)    Makes her crazy    PAST MEDICAL HISTORY Past Medical History:  Diagnosis Date  .  Acid reflux   . Anemia   . Arthropathy   . Cognitive decline    referred to neurologist  . Depression 10/07/2013   daughter didn't know anything about this  . Dizziness   . GERD (gastroesophageal reflux disease) 05/04/2013  . Hip fx ([place])    r hip, bilat pubis rami  . Hyperlipemia   . Hypertension   . Hypothyroidism   . Insomnia   . Multilevel degenerative disc disease   . Palpitations 05/04/2013   saw cardiologist in 2015, he deleted A. Fib from the record as per note not documentation to support  . Short-term memory loss   . Vitamin D  deficiency    Past Surgical History:  Procedure Laterality Date  . ABDOMINAL HYSTERECTOMY  1971  . BLADDER SURGERY  2010  . Broken Pelvis  2015  . C-EYE SURGERY PROCEDURE Bilateral    zaldivar  . CATARACT EXTRACTION Bilateral    Done in [place]  . HIP PINNING,CANNULATED Right 10/07/2013   Procedure: CANNULATED screws right hip;  Surgeon: Mcarthur Rossetti, MD;  Location: [facility];  Service: Orthopedics;  Laterality: Right;    FAMILY HISTORY Family History  Problem Relation Age of Onset  . Prostate cancer Father   . Heart attack Sister   . Cancer Brother   . Cancer Sister   . Heart attack Sister   . Cancer Sister   . Stroke Daughter   . Dementia Neg Hx   . Alzheimer's disease Neg Hx     SOCIAL HISTORY Social History   Tobacco Use  . Smoking status: Former Smoker    Types: Cigarettes    Quit date: 08/02/1970    Years since quitting: 48.3  . Smokeless tobacco: Never Used  . Tobacco comment: "smoked very little"  Substance Use Topics  . Alcohol use: Never    Alcohol/week: 0.0 standard drinks    Frequency: Never  . Drug use: Never         OPHTHALMIC EXAM:  Base Eye Exam    Visual Acuity (Snellen - Linear)      Right Left   Dist [place] 20/30 -1 20/50 -2   Dist ph [employer] 20/25 -1 20/40       Tonometry (Tonopen, 1:19 PM)      Right Left   Pressure 15 16       Pupils      Dark Light Shape React APD   Right 3 2 Round Brisk None   Left 3 2 Round Brisk None       Visual Fields      Left Right    Full        Extraocular Movement      Right Left    Full, Ortho Full, Ortho       Neuro/Psych    Oriented x3: Yes   Mood/Affect: Normal       Dilation    Both eyes: 1.0% Mydriacyl, 2.5% Phenylephrine @ 1:15 PM        Slit Lamp and Fundus Exam    Slit Lamp Exam      Right Left   Lids/Lashes Dermatochalasis - upper lid Dermatochalasis - upper lid   Conjunctiva/Sclera White and quiet White and quiet   Cornea Arcus, 1+ Punctate epithelial erosions, mild  EBMD and corneal haze Arcus, 1+ Punctate epithelial erosions, EBMD, irregular epi   Anterior Chamber Deep and quiet Deep and quiet   Iris Round and dilated Round and moderately dilated to 81m  Lens PC IOL in good position with open PC PC IOL in good position with open PC   Vitreous Vitreous syneresis, Posterior vitreous detachment Vitreous syneresis; PVD; vitreous condensations       Fundus Exam      Right Left   Disc Mild temporal Pallor, Peripapillary atrophy mild tilt, Mild temporal Pallor, Peripapillary atrophy, Compact   C/D Ratio 0.1 0.1   Macula Flat, Blunted foveal reflex, Epiretinal membrane, drusen, RPE mottling and clumping, No heme or edema Flat, Blunted foveal reflex, RPE mottling and clumping, Epiretinal membrane, blot hemes superior macula, shallow SRF temporal macula -- serous RD   Vessels Vascular attenuation, Tortuous Vascular attenuation, Tortuous   Periphery bullous IT schisis cavity from 0700-0830 extending posteriorly almost to arcades otherwise attached -- stable from prior, focal pigmented CR scar at 0630 Large sub-retinal hemorrhage temporally spanning from 0200-0430 - heme turning yellow; +SRF/SRH temporally - improved from prior          IMAGING AND PROCEDURES  Imaging and Procedures for _0 @  OCT, Retina - OU - Both Eyes       Right Eye Quality was good. Central Foveal Thickness: 340. Progression has been stable. Findings include epiretinal membrane, normal foveal contour, no IRF, no SRF, retinal drusen .   Left Eye Quality was good. Central Foveal Thickness: 334. Progression has been stable. Findings include no IRF, epiretinal membrane, retinal drusen , preretinal fibrosis, abnormal foveal contour, no SRF (Stable improvement in SRF -- no longer visible on macular widescan).   Notes *Images captured and stored on drive  Diagnosis / Impression:  NFP, no IRF/SRF centrally OU OS: stable improvement in SRF -- no longer visible on macular widescan    Clinical management:  See below  Abbreviations: NFP - Normal foveal profile. CME - cystoid macular edema. PED - pigment epithelial detachment. IRF - intraretinal fluid. SRF - subretinal fluid. EZ - ellipsoid zone. ERM - epiretinal membrane. ORA - outer retinal atrophy. ORT - outer retinal tubulation. SRHM - subretinal hyper-reflective material        Intravitreal Injection, Pharmacologic Agent - OS - Left Eye       Time Out 11/14/2018. 1:45 PM. Confirmed correct patient, procedure, site, and patient consented.   Anesthesia Topical anesthesia was used. Anesthetic medications included Lidocaine 2%, Proparacaine 0.5%.   Procedure Preparation included 5% betadine to ocular surface, eyelid speculum. A 30 gauge needle was used.   Injection:  2 mg aflibercept Alfonse Flavors) SOLN   NDC: A3590391, Lot: [id number], Expiration date: 04/30/2019   Route: Intravitreal, Site: Left Eye, Waste: 0.05 mL  Post-op Post injection exam found visual acuity of at least counting fingers. The patient tolerated the procedure well. There were no complications. The patient received written and verbal post procedure care education.                 ASSESSMENT/PLAN:    ICD-10-CM   1. Exudative age-related macular degeneration of left eye with active choroidal neovascularization (HCC)  H35.3221 Intravitreal Injection, Pharmacologic Agent - OS - Left Eye    aflibercept (EYLEA) SOLN 2 mg  2. Subretinal hemorrhage of left eye  H35.62   3. Serous retinal detachment of left eye  H33.22   4. Retinal edema  H35.81 OCT, Retina - OU - Both Eyes  5. 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 Essential hypertension  I10   8. Hypertensive retinopathy of both eyes  H35.033   9.  Pseudophakia of both eyes  Z96.1   10. Vitreous floaters of left eye  H43.392     1-4. 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 w/ shallow SRF/serous RD  - initially presented  with symptomatic floaters OS  - initial exam showed large peripheral temporal subretinal hemorrhage OS -- spanning 2-430 -- also mild vitreous opacities and condensations that were causing symptomatic floaters  - s/p IVA OS #1 (08.05.20) for subretinal hemorrhage, possible peripheral CNV  - s/p IVE OS #1 (09.02.20) - sample, #2 (10.12.20)   - today BCVA improved to 20/40 from  20/50 OS  - exam shows interval improvement in subretinal heme and shallow SRF--no longer extending posteriorly into macula  - OCT shows stable improvement in SRF -- no longer visible on macular widescan   - differential includes choroidal melanoma or other subretinal mass -- pt with +history of skin melanoma  - B-scan (9.18.20) showed hyperreflective, ill-defined mass, 0300 periphery OS  - was seen by Dr. Daralene Milch, Ocular Oncologist, on 9.22.2020 -- no tumor on exam or b-scan  - recommend IVE OS #3 today, 11.11.20 -- for exudative ARMD w/ peripheral subretinal heme  - pt wishes to proceed  - RBA of procedure discussed, questions answered  - informed consent obtained and signed  - see procedure note  - [employer]yle[employer] Benefits investigation initiated 9.2.2020 -- approved as of 09.18.20  - f/u in 4 wks, sooner prn, OCT/DFE/optos colors, possible injection  5. Epiretinal membrane, OU.  - mild ERM OU  - asymptomatic, no metamorphopsia  - no indication for surgery at this time  - monitor for now  6. Retinoschisis OD -- stable  - bullous schisis cavity, IT quadrant, from 0700-0830, extends posteriorly almost to arcades  - no associated RT/RD  - stable from prior  - discussed findings and prognosis  - recommend monitoring for now  - if progressing posteriorly, consider intervention / laser retinopexy  7,8. Hypertensive retinopathy OU  - discussed importance of tight BP control  - monitor  9. Pseudophakia OU  - s/p CE/IOL OU  - beautiful surgery, doing well  - monitor   Ophthalmic Meds Ordered this visit:  Meds  ordered this encounter  Medications  . aflibercept (EYLEA) SOLN 2 mg       Return in about 4 weeks (around 12/12/2018) for f/u exu ARMD OS, DFE, OCT, OPTOS colors.  There are no Patient Instructions on file for this visit.   Explained the diagnoses, plan, and follow up with the patient and they expressed understanding.  Patient expressed understanding of the importance of proper follow up care.  This document serves as a record of services personally performed by Gardiner Sleeper, MD, PhD. It was created on their behalf by Roselee Nova, COMT. The creation of this record is the provider's dictation and/or activities during the visit.  Electronically signed by: Roselee Nova, COMT 11/18/18 8:09 PM   Gardiner Sleeper, M.D., Ph.D. Diseases & Surgery of the Retina and Vitreous [facility] 11/14/2018    I have reviewed the above documentation for accuracy and completeness, and I agree with the above. Gardiner Sleeper, M.D., Ph.D. 11/18/18 8:09 PM    Abbreviations: M myopia (nearsighted); A astigmatism; H hyperopia (farsighted); P presbyopia; Mrx spectacle prescription;  CTL contact lenses; OD right eye; OS left eye; OU both eyes  XT exotropia; ET esotropia; PEK punctate epithelial keratitis; PEE punctate epithelial erosions; DES dry eye syndrome; MGD meibomian gland dysfunction; ATs artificial  tears; PFAT's preservative free artificial tears; [place] nuclear sclerotic cataract; PSC posterior subcapsular cataract; ERM epi-retinal membrane; PVD posterior vitreous detachment; RD retinal detachment; DM diabetes mellitus; DR diabetic retinopathy; NPDR non-proliferative diabetic retinopathy; PDR proliferative diabetic retinopathy; CSME clinically significant macular edema; DME diabetic macular edema; dbh dot blot hemorrhages; CWS cotton wool spot; POAG primary open angle glaucoma; C/D cup-to-disc ratio; HVF humphrey visual field; GVF goldmann visual field; OCT optical coherence  tomography; IOP intraocular pressure; BRVO Branch retinal vein occlusion; CRVO central retinal vein occlusion; CRAO central retinal artery occlusion; BRAO branch retinal artery occlusion; RT retinal tear; SB scleral buckle; PPV pars plana vitrectomy; VH Vitreous hemorrhage; PRP panretinal laser photocoagulation; IVK intravitreal kenalog; VMT vitreomacular traction; MH Macular hole;  NVD neovascularization of the disc; NVE neovascularization elsewhere; AREDS age related eye disease study; ARMD age related macular degeneration; POAG primary open angle glaucoma; EBMD epithelial/anterior basement membrane dystrophy; ACIOL anterior chamber intraocular lens; IOL intraocular lens; PCIOL posterior chamber intraocular lens; Phaco/IOL phacoemulsification with intraocular lens placement; [place] photorefractive keratectomy; LASIK laser assisted in situ keratomileusis; HTN hypertension; DM diabetes mellitus; COPD chronic obstructive pulmonary disease

## 2018-11-14 ENCOUNTER — Ambulatory Visit (INDEPENDENT_AMBULATORY_CARE_PROVIDER_SITE_OTHER): Payer: Medicare Other | Admitting: Ophthalmology

## 2018-11-14 ENCOUNTER — Encounter (INDEPENDENT_AMBULATORY_CARE_PROVIDER_SITE_OTHER): Payer: Self-pay | Admitting: Ophthalmology

## 2018-11-14 DIAGNOSIS — H43392 Other vitreous opacities, left eye: Secondary | ICD-10-CM

## 2018-11-14 DIAGNOSIS — H33101 Unspecified retinoschisis, right eye: Secondary | ICD-10-CM | POA: Diagnosis not present

## 2018-11-14 DIAGNOSIS — H3562 Retinal hemorrhage, left eye: Secondary | ICD-10-CM

## 2018-11-14 DIAGNOSIS — H35033 Hypertensive retinopathy, bilateral: Secondary | ICD-10-CM

## 2018-11-14 DIAGNOSIS — H353221 Exudative age-related macular degeneration, left eye, with active choroidal neovascularization: Secondary | ICD-10-CM | POA: Diagnosis not present

## 2018-11-14 DIAGNOSIS — I10 Essential (primary) hypertension: Secondary | ICD-10-CM

## 2018-11-14 DIAGNOSIS — H35373 Puckering of macula, bilateral: Secondary | ICD-10-CM

## 2018-11-14 DIAGNOSIS — Z961 Presence of intraocular lens: Secondary | ICD-10-CM | POA: Diagnosis not present

## 2018-11-14 DIAGNOSIS — H3322 Serous retinal detachment, left eye: Secondary | ICD-10-CM | POA: Diagnosis not present

## 2018-11-14 DIAGNOSIS — H3581 Retinal edema: Secondary | ICD-10-CM | POA: Diagnosis not present

## 2018-11-18 MED ORDER — AFLIBERCEPT 2MG/0.05ML IZ SOLN FOR KALEIDOSCOPE
2.0000 mg | INTRAVITREAL | Status: AC | PRN
  Administered 2018-11-18: 2 mg via INTRAVITREAL

## 2018-12-10 NOTE — Progress Notes (Signed)
[facility] Note  12/12/2018     CHIEF COMPLAINT Patient presents for Retina Follow Up   HISTORY OF PRESENT ILLNESS: Tracey Rosario is a 85 y.o. female who presents to the clinic today for:   HPI    Retina Follow Up    Patient presents with  Dry AMD.  In left eye.  This started 3 months ago.  Since onset it is stable.  I, the attending physician,  performed the HPI with the patient and updated documentation appropriately.          Comments    F/U EXU AMD OS. Patient states her vision is about the same as last ov, denies new visual issues. Pt is using Restsais PRN .        Last edited by Bernarda Caffey, MD on 12/12/2018  6:22 PM. (History)    pt reports left eye vision may be a little worse today   Referring physician: Simona Huh, NP [place],  [place] [id number]  HISTORICAL INFORMATION:   Selected notes from the MEDICAL RECORD NUMBER Referred by Dr. Quentin Ore for retinal eval LEE: 07.20.20 [name]) [BCVA: OD: 20/25 OS: 20/60] Ocular Hx-pseudo OU, ERM OU, DES OU, ABM, corneal dystrophy, retinoschisis, HTN ret, ptosis, conj cyst PMH-HTN, HLD, headaches, anxiety   CURRENT MEDICATIONS: Current Outpatient Medications (Ophthalmic Drugs)  Medication Sig  . fluorometholone (FML) 0.1 % ophthalmic suspension Place 1 drop into the left eye 2 (two) times daily.  . RESTASIS 0.05 % ophthalmic emulsion Place 1 drop into the left eye 2 (two) times daily.   No current facility-administered medications for this visit.  (Ophthalmic Drugs)   Current Outpatient Medications (Other)  Medication Sig  . ALPRAZolam (XANAX) 0.25 MG tablet Take 0.25 mg by mouth at bedtime.  Marland Kitchen amLODipine (NORVASC) 2.5 MG tablet TAKE 1 TABLET DAILY (NEED AN APPOINTMENT)  . aspirin 81 MG tablet Take 162 mg by mouth daily.   Marland Kitchen atenolol (TENORMIN) 50 MG tablet Take 25 mg by mouth daily.   . Cholecalciferol (VITAMIN D3 PO) Take 5,000 Int'l Units by mouth daily.  .  citalopram (CELEXA) 20 MG tablet TAKE 1 TABLET DAILY (NEED AN APPOINTMENT)  . cyclobenzaprine (FLEXERIL) 10 MG tablet Take 10 mg by mouth 3 (three) times daily.  Marland Kitchen donepezil (ARICEPT) 10 MG tablet Take 10 mg by mouth daily.  . fluticasone (FLONASE) 50 MCG/ACT nasal spray USE 1 SPRAY IN EACH NOSTRIL DAILY (NEED APPOINTMENT)  . levothyroxine (SYNTHROID) 50 MCG tablet Take 50 mcg by mouth daily.  Marland Kitchen lisinopril (PRINIVIL,ZESTRIL) 30 MG tablet TAKE 1 TABLET DAILY (NEED AN APPOINTMENT)  . meloxicam (MOBIC) 7.5 MG tablet   . methylPREDNISolone (MEDROL) 4 MG tablet Take as directed  . omeprazole (PRILOSEC) 20 MG capsule TAKE 1 CAPSULE DAILY  . pravastatin (PRAVACHOL) 20 MG tablet TAKE 1 TABLET DAILY (NEED AN APPOINTMENT)  . Probiotic Product (PROBIOTIC PO) Take by mouth.   No current facility-administered medications for this visit.  (Other)      REVIEW OF SYSTEMS: ROS    Positive for: Eyes   Negative for: Constitutional, Gastrointestinal, Neurological, Skin, Genitourinary, Musculoskeletal, HENT, Endocrine, Cardiovascular, Respiratory, Psychiatric, Allergic/Imm, Heme/Lymph   Last edited by Zenovia Jordan, LPN on 81/01/5724  2:03 PM. (History)       ALLERGIES Allergies  Allergen Reactions  . Codeine Other (See Comments)    Makes her crazy    PAST MEDICAL HISTORY Past Medical History:  Diagnosis Date  .  Acid reflux   . Anemia   . Arthropathy   . Cognitive decline    referred to neurologist  . Depression 10/07/2013   daughter didn't know anything about this  . Dizziness   . GERD (gastroesophageal reflux disease) 05/04/2013  . Hip fx ([facility])    r hip, bilat pubis rami  . Hyperlipemia   . Hypertension   . Hypothyroidism   . Insomnia   . Multilevel degenerative disc disease   . Palpitations 05/04/2013   saw cardiologist in 2015, he deleted A. Fib from the record as per note not documentation to support  . Short-term memory loss   . Vitamin D deficiency    Past Surgical History:   Procedure Laterality Date  . ABDOMINAL HYSTERECTOMY  1971  . BLADDER SURGERY  2010  . Broken Pelvis  2015  . C-EYE SURGERY PROCEDURE Bilateral    zaldivar  . CATARACT EXTRACTION Bilateral    Done in [place]  . HIP PINNING,CANNULATED Right 10/07/2013   Procedure: CANNULATED screws right hip;  Surgeon: Mcarthur Rossetti, MD;  Location: [facility];  Service: Orthopedics;  Laterality: Right;    FAMILY HISTORY Family History  Problem Relation Age of Onset  . Prostate cancer Father   . Heart attack Sister   . Cancer Brother   . Cancer Sister   . Heart attack Sister   . Cancer Sister   . Stroke Daughter   . Dementia Neg Hx   . Alzheimer's disease Neg Hx     SOCIAL HISTORY Social History   Tobacco Use  . Smoking status: Former Smoker    Types: Cigarettes    Quit date: 08/02/1970    Years since quitting: 48.3  . Smokeless tobacco: Never Used  . Tobacco comment: "smoked very little"  Substance Use Topics  . Alcohol use: Never    Alcohol/week: 0.0 standard drinks    Frequency: Never  . Drug use: Never         OPHTHALMIC EXAM:  Base Eye Exam    Visual Acuity (Snellen - Linear)      Right Left   Dist cc 20/30 -2 20/60   Dist ph cc NI NI       Tonometry (Tonopen, 1:34 PM)      Right Left   Pressure 10 12       Pupils      Dark Light Shape React APD   Right 3 2 Round Brisk None   Left 3 2 Round Brisk None       Visual Fields (Counting fingers)      Left Right    Full Full       Extraocular Movement      Right Left    Full, Ortho Full, Ortho       Neuro/Psych    Oriented x3: Yes   Mood/Affect: Normal       Dilation    Both eyes: 1.0% Mydriacyl, 2.5% Phenylephrine @ 1:29 PM        Slit Lamp and Fundus Exam    Slit Lamp Exam      Right Left   Lids/Lashes Dermatochalasis - upper lid Dermatochalasis - upper lid   Conjunctiva/Sclera White and quiet White and quiet   Cornea Arcus, 1+ Punctate epithelial erosions, mild EBMD and corneal haze Arcus,  1+ Punctate epithelial erosions, EBMD, irregular epi   Anterior Chamber Deep and quiet Deep and quiet   Iris Round and dilated Round and moderately dilated to 58m  Lens PC IOL in good position with open PC PC IOL in good position with open PC   Vitreous Vitreous syneresis, Posterior vitreous detachment Vitreous syneresis; PVD; vitreous condensations       Fundus Exam      Right Left   Disc Mild temporal Pallor, Peripapillary atrophy mild tilt, Mild temporal Pallor, Peripapillary atrophy, Compact   C/D Ratio 0.1 0.1   Macula Flat, Blunted foveal reflex, mild Epiretinal membrane, drusen, RPE mottling and clumping, No heme or edema Flat, Blunted foveal reflex, RPE mottling and clumping, Epiretinal membrane, blot hemes superior macula, shallow SRF temporal macula -- resolved   Vessels Vascular attenuation, Tortuous Vascular attenuation, Tortuous   Periphery bullous IT schisis cavity from 0700-0830 extending posteriorly almost to arcades otherwise attached -- stable from prior, focal pigmented CR scar at 0630 Interval improvement in temporal sub-retinal hemorrhage - red heme shrinking and turning yellow; +SRF temporally - stably improved from prior          IMAGING AND PROCEDURES  Imaging and Procedures for '@TODAY'$ @  OCT, Retina - OU - Both Eyes       Right Eye Quality was good. Central Foveal Thickness: 335. Progression has been stable. Findings include epiretinal membrane, normal foveal contour, no IRF, no SRF, retinal drusen , pigment epithelial detachment.   Left Eye Quality was good. Central Foveal Thickness: 322. Progression has been stable. Findings include no IRF, epiretinal membrane, retinal drusen , preretinal fibrosis, abnormal foveal contour, no SRF (Stable improvement in SRF -- still not visible on macular widescan).   Notes *Images captured and stored on drive  Diagnosis / Impression:  NFP, no IRF/SRF centrally OU OS: stable improvement in SRF -- still not visible on  macular widescan   Clinical management:  See below  Abbreviations: NFP - Normal foveal profile. CME - cystoid macular edema. PED - pigment epithelial detachment. IRF - intraretinal fluid. SRF - subretinal fluid. EZ - ellipsoid zone. ERM - epiretinal membrane. ORA - outer retinal atrophy. ORT - outer retinal tubulation. SRHM - subretinal hyper-reflective material        Intravitreal Injection, Pharmacologic Agent - OS - Left Eye       Time Out 12/12/2018. 1:41 PM. Confirmed correct patient, procedure, site, and patient consented.   Anesthesia Topical anesthesia was used. Anesthetic medications included Lidocaine 2%, Proparacaine 0.5%.   Procedure Preparation included 5% betadine to ocular surface, eyelid speculum. A 30 gauge needle was used.   Injection:  2 mg aflibercept Alfonse Flavors) SOLN   NDC: A3590391, Lot: [id number], Expiration date: 05/30/2019   Route: Intravitreal, Site: Left Eye, Waste: 0.05 mL  Post-op Post injection exam found visual acuity of at least counting fingers. The patient tolerated the procedure well. There were no complications. The patient received written and verbal post procedure care education.        Color Fundus Photography Optos - OU - Both Eyes       Right Eye Progression has been stable. Disc findings include normal observations. Macula : retinal pigment epithelium abnormalities. Vessels : attenuated, tortuous vessels. Periphery : RPE abnormality (IT schisis -- stable from prior).   Left Eye Progression has improved. Disc findings include normal observations. Macula : normal observations. Vessels : attenuated. Periphery : RPE abnormality, hemorrhage (Interval improvement in sub retinal hemorrhage temporally).   Notes **Images stored on drive**  Impression: OD: bullous retinoschisis, IT periphery -- stable from prior OS: PEHCR with subretinal heme and pigmentary changes temporal periphery -- improved from prior  ASSESSMENT/PLAN:    ICD-10-CM   1. Exudative age-related macular degeneration of left eye with active choroidal neovascularization (HCC)  H35.3221 Intravitreal Injection, Pharmacologic Agent - OS - Left Eye    aflibercept (EYLEA) SOLN 2 mg  2. Subretinal hemorrhage of left eye  H35.62 Color Fundus Photography Optos - OU - Both Eyes  3. Serous retinal detachment of left eye  H33.22   4. Retinal edema  H35.81 OCT, Retina - OU - Both Eyes  5. 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 Essential hypertension  I10   8. Hypertensive retinopathy of both eyes  H35.033   9. Pseudophakia of both eyes  Z96.1     1-4. 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 w/ shallow SRF/serous RD  - initially presented with symptomatic floaters OS  - initial exam showed large peripheral temporal subretinal hemorrhage OS -- spanning 2-430 -- also mild vitreous opacities and condensations that were causing symptomatic floaters  - s/p IVA OS #1 (08.05.20) for subretinal hemorrhage, possible peripheral CNV  - s/p IVE OS #1 (09.02.20) - sample, #2 (10.12.20), #3 (11.11.20)   - today BCVA decreased to 20/60 from 20/40 OS  - exam shows interval improvement in subretinal heme and shallow SRF--no longer extending posteriorly into macula  - repeat color Optos image obtained 12.9.20  - OCT shows stable improvement in SRF -- not visible on widefield OCT   - differential includes choroidal melanoma or other subretinal mass -- pt with +history of skin melanoma  - B-scan (9.18.20) showed hyperreflective, ill-defined mass, 0300 periphery OS  - was seen by Dr. Daralene Milch, Ocular Oncologist, on 9.22.2020 -- no tumor on exam or b-scan  - recommend IVE OS #4 today, 12.09.20 -- for exudative ARMD w/ peripheral subretinal heme  - pt wishes to proceed  - RBA of procedure discussed, questions answered  - informed consent obtained and  signed  - see procedure note  - [employer] Benefits investigation initiated 9.2.2020 -- approved as of 09.18.20  - f/u in 4 wks, sooner prn, OCT/DFE/optos colors, possible injection  5. Epiretinal membrane, OU.  - mild ERM OU  - asymptomatic, no metamorphopsia  - no indication for surgery at this time  - monitor for now  6. Retinoschisis OD -- stable  - bullous schisis cavity, IT quadrant, from 0700-0830, extends posteriorly almost to arcades  - no associated RT/RD  - stable from prior  - discussed findings and prognosis  - recommend monitoring for now  - if progressing posteriorly, consider intervention / laser retinopexy  7,8. Hypertensive retinopathy OU  - discussed importance of tight BP control  - monitor  9. Pseudophakia OU  - s/p CE/IOL OU  - beautiful surgery, doing well  - monitor   Ophthalmic Meds Ordered this visit:  Meds ordered this encounter  Medications  . aflibercept (EYLEA) SOLN 2 mg       Return in about 4 weeks (around 01/09/2019) for f/u exu ARMD OS, DFE, OCT.  There are no Patient Instructions on file for this visit.   Explained the diagnoses, plan, and follow up with the patient and they expressed understanding.  Patient expressed understanding of the importance of proper follow up care.  This document serves as a record of services personally performed by Gardiner Sleeper, MD, PhD. It was created on their behalf by Roselee Nova, COMT. The creation of this record  is the provider's dictation and/or activities during the visit.  Electronically signed by: Roselee Nova, COMT 12/12/18 6:28 PM  Gardiner Sleeper, M.D., Ph.D. Diseases & Surgery of the Retina and [place] 12/12/2018   I have reviewed the above documentation for accuracy and completeness, and I agree with the above. Gardiner Sleeper, M.D., Ph.D. 12/12/18 6:28 PM   Abbreviations: M myopia (nearsighted); A astigmatism; H hyperopia (farsighted); P  presbyopia; Mrx spectacle prescription;  CTL contact lenses; OD right eye; OS left eye; OU both eyes  XT exotropia; ET esotropia; PEK punctate epithelial keratitis; PEE punctate epithelial erosions; DES dry eye syndrome; MGD meibomian gland dysfunction; ATs artificial tears; PFAT's preservative free artificial tears; [place] nuclear sclerotic cataract; PSC posterior subcapsular cataract; ERM epi-retinal membrane; PVD posterior vitreous detachment; RD retinal detachment; DM diabetes mellitus; DR diabetic retinopathy; NPDR non-proliferative diabetic retinopathy; PDR proliferative diabetic retinopathy; CSME clinically significant macular edema; DME diabetic macular edema; dbh dot blot hemorrhages; CWS cotton wool spot; POAG primary open angle glaucoma; C/D cup-to-disc ratio; HVF humphrey visual field; GVF goldmann visual field; OCT optical coherence tomography; IOP intraocular pressure; BRVO Branch retinal vein occlusion; CRVO central retinal vein occlusion; CRAO central retinal artery occlusion; BRAO branch retinal artery occlusion; RT retinal tear; SB scleral buckle; PPV pars plana vitrectomy; VH Vitreous hemorrhage; PRP panretinal laser photocoagulation; IVK intravitreal kenalog; VMT vitreomacular traction; MH Macular hole;  NVD neovascularization of the disc; NVE neovascularization elsewhere; AREDS age related eye disease study; ARMD age related macular degeneration; POAG primary open angle glaucoma; EBMD epithelial/anterior basement membrane dystrophy; ACIOL anterior chamber intraocular lens; IOL intraocular lens; PCIOL posterior chamber intraocular lens; Phaco/IOL phacoemulsification with intraocular lens placement; [place] photorefractive keratectomy; LASIK laser assisted in situ keratomileusis; HTN hypertension; DM diabetes mellitus; COPD chronic obstructive pulmonary disease

## 2018-12-12 ENCOUNTER — Encounter (INDEPENDENT_AMBULATORY_CARE_PROVIDER_SITE_OTHER): Payer: Self-pay | Admitting: Ophthalmology

## 2018-12-12 ENCOUNTER — Ambulatory Visit (INDEPENDENT_AMBULATORY_CARE_PROVIDER_SITE_OTHER): Payer: Medicare Other | Admitting: Ophthalmology

## 2018-12-12 ENCOUNTER — Other Ambulatory Visit: Payer: Self-pay

## 2018-12-12 DIAGNOSIS — H3562 Retinal hemorrhage, left eye: Secondary | ICD-10-CM | POA: Diagnosis not present

## 2018-12-12 DIAGNOSIS — I10 Essential (primary) hypertension: Secondary | ICD-10-CM

## 2018-12-12 DIAGNOSIS — H33101 Unspecified retinoschisis, right eye: Secondary | ICD-10-CM | POA: Diagnosis not present

## 2018-12-12 DIAGNOSIS — H3322 Serous retinal detachment, left eye: Secondary | ICD-10-CM | POA: Diagnosis not present

## 2018-12-12 DIAGNOSIS — H353221 Exudative age-related macular degeneration, left eye, with active choroidal neovascularization: Secondary | ICD-10-CM | POA: Diagnosis not present

## 2018-12-12 DIAGNOSIS — H35033 Hypertensive retinopathy, bilateral: Secondary | ICD-10-CM | POA: Diagnosis not present

## 2018-12-12 DIAGNOSIS — H3581 Retinal edema: Secondary | ICD-10-CM

## 2018-12-12 DIAGNOSIS — Z961 Presence of intraocular lens: Secondary | ICD-10-CM

## 2018-12-12 DIAGNOSIS — H35373 Puckering of macula, bilateral: Secondary | ICD-10-CM

## 2018-12-12 MED ORDER — AFLIBERCEPT 2MG/0.05ML IZ SOLN FOR KALEIDOSCOPE
2.0000 mg | INTRAVITREAL | Status: AC | PRN
  Administered 2018-12-12: 2 mg via INTRAVITREAL

## 2019-01-09 ENCOUNTER — Encounter (INDEPENDENT_AMBULATORY_CARE_PROVIDER_SITE_OTHER): Payer: Medicare Other | Admitting: Ophthalmology

## 2019-01-21 NOTE — Progress Notes (Addendum)
[facility] Note  01/23/2019     CHIEF COMPLAINT Patient presents for Retina Follow Up   HISTORY OF PRESENT ILLNESS: Tracey Rosario is a 86 y.o. female who presents to the clinic today for:   HPI    Retina Follow Up    Patient presents with  Dry AMD.  In left eye.  This started 3 months ago.  Since onset it is stable.  I, the attending physician,  performed the HPI with the patient and updated documentation appropriately.          Comments    F/U EXU ARMD. Patient states her vision has been doing "good", denies new visual issues .       Last edited by Bernarda Caffey, MD on 01/25/2019 12:33 PM. (History)    pt reports left eye vision may be a little worse today   Referring physician: Simona Huh, NP [place],  [place] [address]  HISTORICAL INFORMATION:   Selected notes from the MEDICAL RECORD NUMBER Referred by Dr. Quentin Ore for retinal eval LEE: 07.20.20 [name]) [BCVA: OD: 20/25 OS: 20/60] Ocular Hx-pseudo OU, ERM OU, DES OU, ABM, corneal dystrophy, retinoschisis, HTN ret, ptosis, conj cyst PMH-HTN, HLD, headaches, anxiety   CURRENT MEDICATIONS: Current Outpatient Medications (Ophthalmic Drugs)  Medication Sig  . fluorometholone (FML) 0.1 % ophthalmic suspension Place 1 drop into the left eye 2 (two) times daily.  . RESTASIS 0.05 % ophthalmic emulsion Place 1 drop into the left eye 2 (two) times daily.   No current facility-administered medications for this visit. (Ophthalmic Drugs)   Current Outpatient Medications (Other)  Medication Sig  . ALPRAZolam (XANAX) 0.25 MG tablet Take 0.25 mg by mouth at bedtime.  Marland Kitchen amLODipine (NORVASC) 2.5 MG tablet TAKE 1 TABLET DAILY (NEED AN APPOINTMENT)  . aspirin 81 MG tablet Take 162 mg by mouth daily.   Marland Kitchen atenolol (TENORMIN) 50 MG tablet Take 25 mg by mouth daily.   . Cholecalciferol (VITAMIN D3 PO) Take 5,000 Int'l Units by mouth daily.  . citalopram (CELEXA) 20 MG tablet TAKE  1 TABLET DAILY (NEED AN APPOINTMENT)  . cyclobenzaprine (FLEXERIL) 10 MG tablet Take 10 mg by mouth 3 (three) times daily.  Marland Kitchen donepezil (ARICEPT) 10 MG tablet Take 10 mg by mouth daily.  . fluticasone (FLONASE) 50 MCG/ACT nasal spray USE 1 SPRAY IN EACH NOSTRIL DAILY (NEED APPOINTMENT)  . levothyroxine (SYNTHROID) 50 MCG tablet Take 50 mcg by mouth daily.  Marland Kitchen lisinopril (PRINIVIL,ZESTRIL) 30 MG tablet TAKE 1 TABLET DAILY (NEED AN APPOINTMENT)  . meloxicam (MOBIC) 7.5 MG tablet   . methylPREDNISolone (MEDROL) 4 MG tablet Take as directed  . omeprazole (PRILOSEC) 20 MG capsule TAKE 1 CAPSULE DAILY  . pravastatin (PRAVACHOL) 20 MG tablet TAKE 1 TABLET DAILY (NEED AN APPOINTMENT)  . Probiotic Product (PROBIOTIC PO) Take by mouth.   No current facility-administered medications for this visit. (Other)      REVIEW OF SYSTEMS: ROS    Positive for: Eyes   Negative for: Constitutional, Gastrointestinal, Neurological, Skin, Genitourinary, Musculoskeletal, HENT, Endocrine, Cardiovascular, Respiratory, Psychiatric, Allergic/Imm, Heme/Lymph   Last edited by Zenovia Jordan, LPN on 3/79/0240  9:73 PM. (History)       ALLERGIES Allergies  Allergen Reactions  . Codeine Other (See Comments)    Makes her crazy    PAST MEDICAL HISTORY Past Medical History:  Diagnosis Date  . Acid reflux   . Anemia   . Arthropathy   .  Cognitive decline    referred to neurologist  . Depression 10/07/2013   daughter didn't know anything about this  . Dizziness   . GERD (gastroesophageal reflux disease) 05/04/2013  . Hip fx ([place])    r hip, bilat pubis rami  . Hyperlipemia   . Hypertension   . Hypothyroidism   . Insomnia   . Multilevel degenerative disc disease   . Palpitations 05/04/2013   saw cardiologist in 2015, he deleted A. Fib from the record as per note not documentation to support  . Short-term memory loss   . Vitamin D deficiency    Past Surgical History:  Procedure Laterality Date  .  ABDOMINAL HYSTERECTOMY  1971  . BLADDER SURGERY  2010  . Broken Pelvis  2015  . C-EYE SURGERY PROCEDURE Bilateral    zaldivar  . CATARACT EXTRACTION Bilateral    Done in [place]  . HIP PINNING,CANNULATED Right 10/07/2013   Procedure: CANNULATED screws right hip;  Surgeon: Mcarthur Rossetti, MD;  Location: [facility];  Service: Orthopedics;  Laterality: Right;    FAMILY HISTORY Family History  Problem Relation Age of Onset  . Prostate cancer Father   . Heart attack Sister   . Cancer Brother   . Cancer Sister   . Heart attack Sister   . Cancer Sister   . Stroke Daughter   . Dementia Neg Hx   . Alzheimer's disease Neg Hx     SOCIAL HISTORY Social History   Tobacco Use  . Smoking status: Former Smoker    Types: Cigarettes    Quit date: 08/02/1970    Years since quitting: 48.5  . Smokeless tobacco: Never Used  . Tobacco comment: "smoked very little"  Substance Use Topics  . Alcohol use: Never    Alcohol/week: 0.0 standard drinks  . Drug use: Never         OPHTHALMIC EXAM:  Base Eye Exam    Visual Acuity (Snellen - Linear)      Right Left   Dist [place] 20/30 +2 20/50 -2   Dist ph [place] NI NI       Tonometry (Tonopen, 2:16 PM)      Right Left   Pressure 14 17       Pupils      Dark Light Shape React APD   Right 3 2 Round 2 None   Left 3 2 Round 2 None       Visual Fields (Counting fingers)      Left Right    Full Full       Extraocular Movement      Right Left    Full, Ortho Full, Ortho       Neuro/Psych    Oriented x3: Yes   Mood/Affect: Normal       Dilation    Both eyes: 1.0% Mydriacyl, 2.5% Phenylephrine @ 2:13 PM        Slit Lamp and Fundus Exam    Slit Lamp Exam      Right Left   Lids/Lashes Dermatochalasis - upper lid Dermatochalasis - upper lid   Conjunctiva/Sclera White and quiet White and quiet   Cornea Arcus, 1+ Punctate epithelial erosions, mild EBMD and corneal haze Arcus, 1+ Punctate epithelial erosions, EBMD, irregular epi    Anterior Chamber Deep and quiet Deep and quiet   Iris Round and dilated Round and moderately dilated to 29m   Lens PC IOL in good position with open PC PC IOL in good position with open  PC   Vitreous Vitreous syneresis, Posterior vitreous detachment Vitreous syneresis; PVD; vitreous condensations       Fundus Exam      Right Left   Disc Mild temporal Pallor, Peripapillary atrophy, Sharp rim mild tilt, 2+ temporal Pallor, Peripapillary atrophy, Compact   C/D Ratio 0.1 0.1   Macula Flat, Blunted foveal reflex, mild Epiretinal membrane, drusen, RPE mottling and clumping, No heme or edema Flat, Blunted foveal reflex, RPE mottling and clumping, Epiretinal membrane, shallow SRF temporal macula -- resolved   Vessels Vascular attenuation, Tortuous Vascular attenuation, Tortuous   Periphery bullous IT schisis cavity from 0700-0830 extending posteriorly almost to arcades otherwise attached -- stable from prior, focal pigmented CR scar at 0630 Interval improvement in temporal sub-retinal hemorrhage - red heme shrinking and turning white; +SRF temporally - stably resolved          IMAGING AND PROCEDURES  Imaging and Procedures for '@TODAY'$ @  OCT, Retina - OU - Both Eyes       Right Eye Quality was good. Central Foveal Thickness: 347. Progression has been stable. Findings include epiretinal membrane, normal foveal contour, no IRF, no SRF, retinal drusen , pigment epithelial detachment.   Left Eye Quality was good. Central Foveal Thickness: 331. Progression has been stable. Findings include no IRF, epiretinal membrane, retinal drusen , preretinal fibrosis, abnormal foveal contour, no SRF (Stable improvement in SRF -- still not visible on macular widescan).   Notes *Images captured and stored on drive  Diagnosis / Impression:  NFP, no IRF/SRF centrally OU OS: stable improvement in temporal SRF -- still not visible on macular widescan   Clinical management:  See below  Abbreviations: NFP -  Normal foveal profile. CME - cystoid macular edema. PED - pigment epithelial detachment. IRF - intraretinal fluid. SRF - subretinal fluid. EZ - ellipsoid zone. ERM - epiretinal membrane. ORA - outer retinal atrophy. ORT - outer retinal tubulation. SRHM - subretinal hyper-reflective material        Intravitreal Injection, Pharmacologic Agent - OS - Left Eye       Time Out 01/23/2019. 2:20 PM. Confirmed correct patient, procedure, site, and patient consented.   Anesthesia Topical anesthesia was used. Anesthetic medications included Lidocaine 2%, Proparacaine 0.5%.   Procedure Preparation included 5% betadine to ocular surface, eyelid speculum. A supplied (32 g) needle was used.   Injection:  2 mg aflibercept Alfonse Flavors) SOLN   NDC: A3590391, Lot: [id number], Expiration date: 06/30/2019   Route: Intravitreal, Site: Left Eye, Waste: 0.05 mL  Post-op Post injection exam found visual acuity of at least counting fingers. The patient tolerated the procedure well. There were no complications. The patient received written and verbal post procedure care education.        Color Fundus Photography Optos - OU - Both Eyes       Right Eye Progression has been stable. Disc findings include normal observations. Macula : retinal pigment epithelium abnormalities. Vessels : attenuated, tortuous vessels. Periphery : RPE abnormality (IT schisis -- stable from prior).   Left Eye Progression has improved. Disc findings include normal observations. Macula : normal observations. Vessels : attenuated. Periphery : RPE abnormality, hemorrhage (Interval improvement in sub retinal hemorrhage temporally).   Notes **Images stored on drive**  Impression: OD: bullous retinoschisis, IT periphery -- stable from prior OS: PEHCR with subretinal heme and pigmentary changes temporal periphery -- improved from prior                  ASSESSMENT/PLAN:  ICD-10-CM   1. Exudative age-related macular  degeneration of left eye with active choroidal neovascularization (HCC)  H35.3221 Intravitreal Injection, Pharmacologic Agent - OS - Left Eye    Color Fundus Photography Optos - OU - Both Eyes    aflibercept (EYLEA) SOLN 2 mg  2. Subretinal hemorrhage of left eye  H35.62 Color Fundus Photography Optos - OU - Both Eyes  3. Serous retinal detachment of left eye  H33.22   4. Retinal edema  H35.81 OCT, Retina - OU - Both Eyes  5. 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 Essential hypertension  I10   8. Hypertensive retinopathy of both eyes  H35.033   9. Pseudophakia of both eyes  Z96.1   10. Vitreous floaters of left eye  H43.392     1-4. 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 w/ shallow SRF/serous RD  - initially presented with symptomatic floaters OS  - initial exam showed large peripheral temporal subretinal hemorrhage OS -- spanning 2-430 -- also mild vitreous opacities and condensations that were causing symptomatic floaters  - s/p IVA OS #1 (08.05.20) for subretinal hemorrhage, possible peripheral CNV  - s/p IVE OS #1 (09.02.20) - sample, #2 (10.12.20), #3 (11.11.20), #4 (12.09.20)   - today BCVA improvd to 20/50 from 20/60 OS  - exam shows interval improvement in subretinal heme and shallow SRF (SRF essentially resolved)  - repeat color Optos image obtained 12.9.20  - OCT shows stable improvement in SRF -- not visible on widefield OCT   - differential includes choroidal melanoma or other subretinal mass -- pt with +history of skin melanoma  - B-scan (9.18.20) showed hyperreflective, ill-defined mass, 0300 periphery OS  - was seen by Dr. Daralene Milch, Ocular Oncologist, on 9.22.2020 -- no tumor on exam or b-scan  - recommend IVE OS #5 today, 01.20.21 -- for exudative ARMD w/ peripheral subretinal heme  - pt wishes to proceed  - RBA of procedure discussed, questions answered  - informed consent obtained and signed  - see procedure  note  - [employer]yle[employer] Benefits investigation initiated 9.2.2020 -- approved for 2021  - f/u in 4 wks, sooner prn, OCT/DFE/optos colors, possible injection  5. Epiretinal membrane, OU.  - mild ERM OU  - asymptomatic, no metamorphopsia  - no indication for surgery at this time  - monitor for now  6. Retinoschisis OD -- stable  - bullous schisis cavity, IT quadrant, from 0700-0830, extends posteriorly almost to arcades  - no associated RT/RD  - stable from prior  - discussed findings and prognosis  - recommend monitoring for now  - if progressing posteriorly, consider intervention / laser retinopexy  7,8. Hypertensive retinopathy OU  - discussed importance of tight BP control  - monitor  9. Pseudophakia OU  - s/p CE/IOL OU  - beautiful surgery, doing well  - monitor   Ophthalmic Meds Ordered this visit:  Meds ordered this encounter  Medications  . aflibercept (EYLEA) SOLN 2 mg       Return in about 4 weeks (around 02/20/2019) for f/u exu ARMD OS, DFE, OCT.  There are no Patient Instructions on file for this visit.   Explained the diagnoses, plan, and follow up with the patient and they expressed understanding.  Patient expressed understanding of the importance of proper follow up care.  This document serves as a record of services personally performed by Gardiner Sleeper, MD, PhD. It was created on their  behalf by Roselee Nova, COMT. The creation of this record is the provider's dictation and/or activities during the visit.  Electronically signed by: Roselee Nova, COMT 01/25/19 1:05 PM   This document serves as a record of services personally performed by Gardiner Sleeper, MD, PhD. It was created on their behalf by Ernest Mallick, OA, an ophthalmic assistant. The creation of this record is the provider's dictation and/or activities during the visit.    Electronically signed by: Ernest Mallick, OA 01.20.2021 1:05 PM  Gardiner Sleeper, M.D., Ph.D. Diseases & Surgery of the Retina and  [place] 01/23/2019   I have reviewed the above documentation for accuracy and completeness, and I agree with the above. Gardiner Sleeper, M.D., Ph.D. 01/25/19 1:05 PM   Abbreviations: M myopia (nearsighted); A astigmatism; H hyperopia (farsighted); P presbyopia; Mrx spectacle prescription;  CTL contact lenses; OD right eye; OS left eye; OU both eyes  XT exotropia; ET esotropia; PEK punctate epithelial keratitis; PEE punctate epithelial erosions; DES dry eye syndrome; MGD meibomian gland dysfunction; ATs artificial tears; PFAT's preservative free artificial tears; [place] nuclear sclerotic cataract; PSC posterior subcapsular cataract; ERM epi-retinal membrane; PVD posterior vitreous detachment; RD retinal detachment; DM diabetes mellitus; DR diabetic retinopathy; NPDR non-proliferative diabetic retinopathy; PDR proliferative diabetic retinopathy; CSME clinically significant macular edema; DME diabetic macular edema; dbh dot blot hemorrhages; CWS cotton wool spot; POAG primary open angle glaucoma; C/D cup-to-disc ratio; HVF humphrey visual field; GVF goldmann visual field; OCT optical coherence tomography; IOP intraocular pressure; BRVO Branch retinal vein occlusion; CRVO central retinal vein occlusion; CRAO central retinal artery occlusion; BRAO branch retinal artery occlusion; RT retinal tear; SB scleral buckle; PPV pars plana vitrectomy; VH Vitreous hemorrhage; PRP panretinal laser photocoagulation; IVK intravitreal kenalog; VMT vitreomacular traction; MH Macular hole;  NVD neovascularization of the disc; NVE neovascularization elsewhere; AREDS age related eye disease study; ARMD age related macular degeneration; POAG primary open angle glaucoma; EBMD epithelial/anterior basement membrane dystrophy; ACIOL anterior chamber intraocular lens; IOL intraocular lens; PCIOL posterior chamber intraocular lens; Phaco/IOL phacoemulsification with intraocular lens placement; [place]  photorefractive keratectomy; LASIK laser assisted in situ keratomileusis; HTN hypertension; DM diabetes mellitus; COPD chronic obstructive pulmonary disease

## 2019-01-23 ENCOUNTER — Encounter (INDEPENDENT_AMBULATORY_CARE_PROVIDER_SITE_OTHER): Payer: Self-pay | Admitting: Ophthalmology

## 2019-01-23 ENCOUNTER — Ambulatory Visit (INDEPENDENT_AMBULATORY_CARE_PROVIDER_SITE_OTHER): Payer: Medicare Other | Admitting: Ophthalmology

## 2019-01-23 DIAGNOSIS — H3581 Retinal edema: Secondary | ICD-10-CM | POA: Diagnosis not present

## 2019-01-23 DIAGNOSIS — H3322 Serous retinal detachment, left eye: Secondary | ICD-10-CM

## 2019-01-23 DIAGNOSIS — H35373 Puckering of macula, bilateral: Secondary | ICD-10-CM

## 2019-01-23 DIAGNOSIS — H35033 Hypertensive retinopathy, bilateral: Secondary | ICD-10-CM

## 2019-01-23 DIAGNOSIS — H3562 Retinal hemorrhage, left eye: Secondary | ICD-10-CM | POA: Diagnosis not present

## 2019-01-23 DIAGNOSIS — H353221 Exudative age-related macular degeneration, left eye, with active choroidal neovascularization: Secondary | ICD-10-CM

## 2019-01-23 DIAGNOSIS — H43392 Other vitreous opacities, left eye: Secondary | ICD-10-CM

## 2019-01-23 DIAGNOSIS — Z961 Presence of intraocular lens: Secondary | ICD-10-CM

## 2019-01-23 DIAGNOSIS — I10 Essential (primary) hypertension: Secondary | ICD-10-CM

## 2019-01-23 DIAGNOSIS — H33101 Unspecified retinoschisis, right eye: Secondary | ICD-10-CM

## 2019-01-25 ENCOUNTER — Encounter (INDEPENDENT_AMBULATORY_CARE_PROVIDER_SITE_OTHER): Payer: Self-pay | Admitting: Ophthalmology

## 2019-01-25 MED ORDER — AFLIBERCEPT 2MG/0.05ML IZ SOLN FOR KALEIDOSCOPE
2.0000 mg | INTRAVITREAL | Status: AC | PRN
  Administered 2019-01-25: 2 mg via INTRAVITREAL

## 2019-02-19 NOTE — Progress Notes (Signed)
[facility] Note  02/20/2019     CHIEF COMPLAINT Patient presents for Retina Follow Up   HISTORY OF PRESENT ILLNESS: Tracey Rosario is a 86 y.o. female who presents to the clinic today for:   HPI    Retina Follow Up    Patient presents with  Wet AMD.  In left eye.  Severity is moderate.  Duration of 4 weeks.  Since onset it is stable.  I, the attending physician,  performed the HPI with the patient and updated documentation appropriately.          Comments    Patient states occasional film over vision OS. Using Restasis bid OS only. No other gtts.        Last edited by Bernarda Caffey, MD on 02/20/2019  2:17 PM. (History)    pt states her right eye vision is okay, but her left eye vision is blurry, she has been using Restasis BID OU   Referring physician: Simona Huh, NP [place],  [place] [address]  HISTORICAL INFORMATION:   Selected notes from the MEDICAL RECORD NUMBER Referred by Dr. Quentin Ore for retinal eval LEE: 07.20.20 [name]) [BCVA: OD: 20/25 OS: 20/60] Ocular Hx-pseudo OU, ERM OU, DES OU, ABM, corneal dystrophy, retinoschisis, HTN ret, ptosis, conj cyst PMH-HTN, HLD, headaches, anxiety   CURRENT MEDICATIONS: Current Outpatient Medications (Ophthalmic Drugs)  Medication Sig  . RESTASIS 0.05 % ophthalmic emulsion Place 1 drop into the left eye 2 (two) times daily.  . fluorometholone (FML) 0.1 % ophthalmic suspension Place 1 drop into the left eye 2 (two) times daily.   No current facility-administered medications for this visit. (Ophthalmic Drugs)   Current Outpatient Medications (Other)  Medication Sig  . ALPRAZolam (XANAX) 0.25 MG tablet Take 0.25 mg by mouth at bedtime.  Marland Kitchen amLODipine (NORVASC) 2.5 MG tablet TAKE 1 TABLET DAILY (NEED AN APPOINTMENT)  . aspirin 81 MG tablet Take 162 mg by mouth daily.   Marland Kitchen atenolol (TENORMIN) 50 MG tablet Take 25 mg by mouth daily.   . Cholecalciferol (VITAMIN D3 PO) Take  5,000 Int'l Units by mouth daily.  . citalopram (CELEXA) 20 MG tablet TAKE 1 TABLET DAILY (NEED AN APPOINTMENT)  . cyclobenzaprine (FLEXERIL) 10 MG tablet Take 10 mg by mouth 3 (three) times daily.  Marland Kitchen donepezil (ARICEPT) 10 MG tablet Take 10 mg by mouth daily.  . fluticasone (FLONASE) 50 MCG/ACT nasal spray USE 1 SPRAY IN EACH NOSTRIL DAILY (NEED APPOINTMENT)  . levothyroxine (SYNTHROID) 50 MCG tablet Take 50 mcg by mouth daily.  Marland Kitchen lisinopril (PRINIVIL,ZESTRIL) 30 MG tablet TAKE 1 TABLET DAILY (NEED AN APPOINTMENT)  . meloxicam (MOBIC) 7.5 MG tablet   . methylPREDNISolone (MEDROL) 4 MG tablet Take as directed  . omeprazole (PRILOSEC) 20 MG capsule TAKE 1 CAPSULE DAILY  . pravastatin (PRAVACHOL) 20 MG tablet TAKE 1 TABLET DAILY (NEED AN APPOINTMENT)  . Probiotic Product (PROBIOTIC PO) Take by mouth.   No current facility-administered medications for this visit. (Other)      REVIEW OF SYSTEMS: ROS    Positive for: Endocrine, Eyes   Negative for: Constitutional, Gastrointestinal, Neurological, Skin, Genitourinary, Musculoskeletal, HENT, Cardiovascular, Respiratory, Psychiatric, Allergic/Imm, Heme/Lymph   Last edited by Roselee Nova D, COT on 02/20/2019  1:40 PM. (History)       ALLERGIES Allergies  Allergen Reactions  . Codeine Other (See Comments)    Makes her crazy    PAST MEDICAL HISTORY Past Medical History:  Diagnosis  Date  . Acid reflux   . Anemia   . Arthropathy   . Cognitive decline    referred to neurologist  . Depression 10/07/2013   daughter didn't know anything about this  . Dizziness   . GERD (gastroesophageal reflux disease) 05/04/2013  . Hip fx ([place])    r hip, bilat pubis rami  . Hyperlipemia   . Hypertension   . Hypothyroidism   . Insomnia   . Multilevel degenerative disc disease   . Palpitations 05/04/2013   saw cardiologist in 2015, he deleted A. Fib from the record as per note not documentation to support  . Short-term memory loss   . Vitamin D  deficiency    Past Surgical History:  Procedure Laterality Date  . ABDOMINAL HYSTERECTOMY  1971  . BLADDER SURGERY  2010  . Broken Pelvis  2015  . C-EYE SURGERY PROCEDURE Bilateral    [facility]  . CATARACT EXTRACTION Bilateral    Done in [place]  . HIP PINNING,CANNULATED Right 10/07/2013   Procedure: CANNULATED screws right hip;  Surgeon: Mcarthur Rossetti, MD;  Location: [facility];  Service: Orthopedics;  Laterality: Right;    FAMILY HISTORY Family History  Problem Relation Age of Onset  . Prostate cancer Father   . Heart attack Sister   . Cancer Brother   . Cancer Sister   . Heart attack Sister   . Cancer Sister   . Stroke Daughter   . Dementia Neg Hx   . Alzheimer's disease Neg Hx     SOCIAL HISTORY Social History   Tobacco Use  . Smoking status: Former Smoker    Types: Cigarettes    Quit date: 08/02/1970    Years since quitting: 48.5  . Smokeless tobacco: Never Used  . Tobacco comment: "smoked very little"  Substance Use Topics  . Alcohol use: Never    Alcohol/week: 0.0 standard drinks  . Drug use: Never         OPHTHALMIC EXAM:  Base Eye Exam    Visual Acuity (Snellen - Linear)      Right Left   Dist [place] 20/30 +2 20/70 -3   Dist ph [place] NI 20/50 -2       Tonometry (Tonopen, 1:47 PM)      Right Left   Pressure 13 14       Pupils      Dark Light Shape React APD   Right 3 2 Round Brisk None   Left 3 2 Round Brisk None       Visual Fields (Counting fingers)      Left Right    Full Full       Extraocular Movement      Right Left    Full, Ortho Full, Ortho       Neuro/Psych    Oriented x3: Yes   Mood/Affect: Normal       Dilation    Both eyes: 1.0% Mydriacyl, 2.5% Phenylephrine @ 1:48 PM        Slit Lamp and Fundus Exam    Slit Lamp Exam      Right Left   Lids/Lashes Dermatochalasis - upper lid Dermatochalasis - upper lid   Conjunctiva/Sclera White and quiet White and quiet   Cornea Arcus, 1-2+ Punctate epithelial erosions, mild  EBMD and corneal haze Arcus, 3+ Punctate epithelial erosions, EBMD, irregular epi   Anterior Chamber Deep and quiet Deep and quiet   Iris Round and dilated Round and moderately dilated to 54m  Lens PC IOL in good position with open PC PC IOL in good position with open PC   Vitreous Vitreous syneresis, Posterior vitreous detachment Vitreous syneresis; PVD; vitreous condensations       Fundus Exam      Right Left   Disc Mild temporal Pallor, Peripapillary atrophy, Sharp rim mild tilt, 2+ temporal Pallor, Peripapillary atrophy, Compact, Sharp rim   C/D Ratio 0.1 0.0   Macula Flat, Blunted foveal reflex, mild Epiretinal membrane, drusen, RPE mottling and clumping, No heme or edema Flat, Blunted foveal reflex, RPE mottling and clumping, Epiretinal membrane, shallow SRF temporal macula -- resolved   Vessels Vascular attenuation, Tortuous Vascular attenuation, Tortuous   Periphery bullous IT schisis cavity from 0700-0830 extending posteriorly almost to arcades otherwise attached -- stable from prior, focal pigmented CR scar at 0630 persistent temporal sub-retinal hemorrhage - red heme shrinking and turning white; +SRF temporally - stably resolved        Refraction    Manifest Refraction      Sphere Cylinder Axis Dist VA   Right       Left [place] +1.00 180 20/60+1          IMAGING AND PROCEDURES  Imaging and Procedures for _0 @  OCT, Retina - OU - Both Eyes       Right Eye Quality was good. Central Foveal Thickness: 349. Progression has been stable. Findings include epiretinal membrane, normal foveal contour, no IRF, no SRF, retinal drusen , pigment epithelial detachment.   Left Eye Quality was good. Central Foveal Thickness: 328. Progression has been stable. Findings include no IRF, epiretinal membrane, retinal drusen , preretinal fibrosis, abnormal foveal contour, no SRF (Stable improvement in SRF -- still not visible on macular widescan).   Notes *Images captured and stored on  drive  Diagnosis / Impression:  NFP, no IRF/SRF centrally OU OS: stable improvement in temporal SRF -- no SRF on widefield images  Clinical management:  See below  Abbreviations: NFP - Normal foveal profile. CME - cystoid macular edema. PED - pigment epithelial detachment. IRF - intraretinal fluid. SRF - subretinal fluid. EZ - ellipsoid zone. ERM - epiretinal membrane. ORA - outer retinal atrophy. ORT - outer retinal tubulation. SRHM - subretinal hyper-reflective material        Color Fundus Photography Optos - OU - Both Eyes       Right Eye Progression has been stable. Disc findings include normal observations. Macula : retinal pigment epithelium abnormalities. Vessels : attenuated, tortuous vessels. Periphery : RPE abnormality (IT schisis -- stable from prior).   Left Eye Progression has been stable. Disc findings include normal observations. Macula : normal observations. Vessels : attenuated. Periphery : RPE abnormality, hemorrhage (Stable, persistent improvement in sub retinal hemorrhage temporally).   Notes **Images stored on drive**  Impression: OD: bullous retinoschisis, IT periphery -- stable from prior OS: PEHCR with stable, persistent improvement in sub retinal hemorrhage temporally         Intravitreal Injection, Pharmacologic Agent - OS - Left Eye       Time Out 02/20/2019. 2:29 PM. Confirmed correct patient, procedure, site, and patient consented.   Anesthesia Topical anesthesia was used. Anesthetic medications included Proparacaine 0.5%, Lidocaine 2%.   Procedure Preparation included 5% betadine to ocular surface, eyelid speculum. A (32 g) needle was used.   Injection:  2 mg aflibercept Alfonse Flavors) SOLN   NDC: 40981-191-47, Lot: [id number], Expiration date: 07/30/2019   Route: Intravitreal, Site: Left Eye, Waste: 0.05 mL  Post-op Post injection  exam found visual acuity of at least counting fingers. The patient tolerated the procedure well. There were no  complications. The patient received written and verbal post procedure care education.                 ASSESSMENT/PLAN:    ICD-10-CM   1. Exudative age-related macular degeneration of left eye with active choroidal neovascularization (HCC)  H35.3221 Intravitreal Injection, Pharmacologic Agent - OS - Left Eye    aflibercept (EYLEA) SOLN 2 mg  2. Subretinal hemorrhage of left eye  H35.62 Color Fundus Photography Optos - OU - Both Eyes  3. Serous retinal detachment of left eye  H33.22   4. Retinal edema  H35.81 OCT, Retina - OU - Both Eyes  5. 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 Essential hypertension  I10   8. Hypertensive retinopathy of both eyes  H35.033   9. Pseudophakia of both eyes  Z96.1     1-4. 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 w/ shallow SRF/serous RD  - initially presented with symptomatic floaters OS  - initial exam showed large peripheral temporal subretinal hemorrhage OS -- spanning 2-430 -- also mild vitreous opacities and condensations that were causing symptomatic floaters  - s/p IVA OS #1 (08.05.20) for subretinal hemorrhage, peripheral CNV  - s/p IVE OS #1 (09.02.20) - sample, #2 (10.12.20), #3 (11.11.20), #4 (12.09.20), #5 (1.20.21)  - today BCVA 20/50 OS  - exam shows stable improvement in subretinal heme and shallow SRF--no longer extending posteriorly into macula  - repeat color Optos image obtained 02.17.21 -- stable to slightly improved  - OCT shows stable improvement in SRF -- not visible on widefield OCT   - differential includes choroidal melanoma or other subretinal mass -- pt with +history of skin melanoma  - B-scan (9.18.20) showed hyperreflective, ill-defined mass, 0300 periphery OS  - was seen by Dr. Daralene Milch, Ocular Oncologist, on 9.22.2020 -- no tumor on exam or b-scan  - recommend IVE OS #6 today, 02.17.21 -- for exudative ARMD w/ peripheral subretinal heme  - pt wishes to  proceed  - RBA of procedure discussed, questions answered  - informed consent obtained  - Eylea informed consent form signed and scanned on 01.20.2021  - see procedure note  - Eylea4U Benefits investigation initiated 9.2.2020 -- approved for 2021  - f/u in 4 wks, sooner prn, OCT/DFE/optos colors, possible injection  5. Epiretinal membrane, OU.  - mild ERM OU  - asymptomatic, no metamorphopsia  - no indication for surgery at this time  - monitor for now  6. Retinoschisis OD -- stable  - bullous schisis cavity, IT quadrant, from 0700-0830, extends posteriorly almost to arcades  - no associated RT/RD  - stable from prior  - discussed findings and prognosis  - recommend monitoring for now  - if progressing posteriorly, consider intervention / laser retinopexy  7,8. Hypertensive retinopathy OU  - discussed importance of tight BP control  - monitor  9. Pseudophakia OU  - s/p CE/IOL OU  - beautiful surgery, doing well  - monitor   Ophthalmic Meds Ordered this visit:  Meds ordered this encounter  Medications  . aflibercept (EYLEA) SOLN 2 mg       Return in about 4 weeks (around 03/20/2019) for f/u exu ARMD OS, DFE, OCT.  There are no Patient Instructions on file for this visit.   Explained the diagnoses, plan, and follow up with the  patient and they expressed understanding.  Patient expressed understanding of the importance of proper follow up care.  This document serves as a record of services personally performed by Gardiner Sleeper, MD, PhD. It was created on their behalf by Roselee Nova, COMT. The creation of this record is the provider's dictation and/or activities during the visit.  Electronically signed by: Roselee Nova, COMT 02/20/19 11:35 PM  This document serves as a record of services personally performed by Gardiner Sleeper, MD, PhD. It was created on their behalf by Ernest Mallick, OA, an ophthalmic assistant. The creation of this record is the provider's dictation  and/or activities during the visit.    Electronically signed by: Ernest Mallick, OA 02.17.2021 11:35 PM  Gardiner Sleeper, M.D., Ph.D. Diseases & Surgery of the Retina and [place] 02/20/2019   I have reviewed the above documentation for accuracy and completeness, and I agree with the above. Gardiner Sleeper, M.D., Ph.D. 02/20/19 11:35 PM   Abbreviations: M myopia (nearsighted); A astigmatism; H hyperopia (farsighted); P presbyopia; Mrx spectacle prescription;  CTL contact lenses; OD right eye; OS left eye; OU both eyes  XT exotropia; ET esotropia; PEK punctate epithelial keratitis; PEE punctate epithelial erosions; DES dry eye syndrome; MGD meibomian gland dysfunction; ATs artificial tears; PFAT's preservative free artificial tears; [place] nuclear sclerotic cataract; PSC posterior subcapsular cataract; ERM epi-retinal membrane; PVD posterior vitreous detachment; RD retinal detachment; DM diabetes mellitus; DR diabetic retinopathy; NPDR non-proliferative diabetic retinopathy; PDR proliferative diabetic retinopathy; CSME clinically significant macular edema; DME diabetic macular edema; dbh dot blot hemorrhages; CWS cotton wool spot; POAG primary open angle glaucoma; C/D cup-to-disc ratio; HVF humphrey visual field; GVF goldmann visual field; OCT optical coherence tomography; IOP intraocular pressure; BRVO Branch retinal vein occlusion; CRVO central retinal vein occlusion; CRAO central retinal artery occlusion; BRAO branch retinal artery occlusion; RT retinal tear; SB scleral buckle; PPV pars plana vitrectomy; VH Vitreous hemorrhage; PRP panretinal laser photocoagulation; IVK intravitreal kenalog; VMT vitreomacular traction; MH Macular hole;  NVD neovascularization of the disc; NVE neovascularization elsewhere; AREDS age related eye disease study; ARMD age related macular degeneration; POAG primary open angle glaucoma; EBMD epithelial/anterior basement membrane dystrophy; ACIOL  anterior chamber intraocular lens; IOL intraocular lens; PCIOL posterior chamber intraocular lens; Phaco/IOL phacoemulsification with intraocular lens placement; [place] photorefractive keratectomy; LASIK laser assisted in situ keratomileusis; HTN hypertension; DM diabetes mellitus; COPD chronic obstructive pulmonary disease

## 2019-02-20 ENCOUNTER — Ambulatory Visit (INDEPENDENT_AMBULATORY_CARE_PROVIDER_SITE_OTHER): Payer: Medicare Other | Admitting: Ophthalmology

## 2019-02-20 ENCOUNTER — Encounter (INDEPENDENT_AMBULATORY_CARE_PROVIDER_SITE_OTHER): Payer: Self-pay | Admitting: Ophthalmology

## 2019-02-20 DIAGNOSIS — H353221 Exudative age-related macular degeneration, left eye, with active choroidal neovascularization: Secondary | ICD-10-CM

## 2019-02-20 DIAGNOSIS — H3322 Serous retinal detachment, left eye: Secondary | ICD-10-CM | POA: Diagnosis not present

## 2019-02-20 DIAGNOSIS — H35033 Hypertensive retinopathy, bilateral: Secondary | ICD-10-CM

## 2019-02-20 DIAGNOSIS — H33101 Unspecified retinoschisis, right eye: Secondary | ICD-10-CM

## 2019-02-20 DIAGNOSIS — H3562 Retinal hemorrhage, left eye: Secondary | ICD-10-CM

## 2019-02-20 DIAGNOSIS — H35373 Puckering of macula, bilateral: Secondary | ICD-10-CM

## 2019-02-20 DIAGNOSIS — Z961 Presence of intraocular lens: Secondary | ICD-10-CM

## 2019-02-20 DIAGNOSIS — H3581 Retinal edema: Secondary | ICD-10-CM | POA: Diagnosis not present

## 2019-02-20 DIAGNOSIS — I10 Essential (primary) hypertension: Secondary | ICD-10-CM

## 2019-02-20 MED ORDER — AFLIBERCEPT 2MG/0.05ML IZ SOLN FOR KALEIDOSCOPE
2.0000 mg | INTRAVITREAL | Status: AC | PRN
  Administered 2019-02-20: 2 mg via INTRAVITREAL

## 2019-02-23 ENCOUNTER — Ambulatory Visit: Payer: Medicare Other | Attending: Internal Medicine

## 2019-02-23 DIAGNOSIS — Z23 Encounter for immunization: Secondary | ICD-10-CM | POA: Insufficient documentation

## 2019-02-23 NOTE — Progress Notes (Signed)
   Covid-19 Vaccination Clinic  Name:  [name]    MRN: [id number] DOB: [birth year]  02/23/2019  [name] was observed post Covid-19 immunization for 15 minutes without incidence. She was provided with Vaccine Information Sheet and instruction to access the V-Safe system.   [name] was instructed to call 911 with any severe reactions post vaccine: Marland Kitchen Difficulty breathing  . Swelling of your face and throat  . A fast heartbeat  . A bad rash all over your body  . Dizziness and weakness    Immunizations Administered    Name Date Dose VIS Date Route   Pfizer COVID-19 Vaccine 02/23/2019 10:40 AM 0.3 mL 12/14/2018 Intramuscular   Manufacturer: [employer], [employer]   Lot: [id number]   NDC: [address]-2

## 2019-03-18 NOTE — Progress Notes (Signed)
[facility] Note  03/21/2019     CHIEF COMPLAINT Patient presents for Retina Follow Up   HISTORY OF PRESENT ILLNESS: Tracey Rosario is a 86 y.o. female who presents to the clinic today for:   HPI    Retina Follow Up    Patient presents with  Wet AMD.  In left eye.  This started weeks ago.  Severity is moderate.  Duration of weeks.  Since onset it is stable.  I, the attending physician,  performed the HPI with the patient and updated documentation appropriately.          Comments    Pt states vision is about the same.  Pt denies eye pain or discomfort and denies any new or worsening floaters or fol OU.       Last edited by Bernarda Caffey, MD on 03/21/2019  9:43 AM. (History)    pt states    Referring physician: Simona Huh, NP [place],  [place] [address]  HISTORICAL INFORMATION:   Selected notes from the MEDICAL RECORD NUMBER Referred by Dr. Quentin Ore for retinal eval LEE: 07.20.20 [name]) [BCVA: OD: 20/25 OS: 20/60] Ocular Hx-pseudo OU, ERM OU, DES OU, ABM, corneal dystrophy, retinoschisis, HTN ret, ptosis, conj cyst PMH-HTN, HLD, headaches, anxiety   CURRENT MEDICATIONS: Current Outpatient Medications (Ophthalmic Drugs)  Medication Sig  . fluorometholone (FML) 0.1 % ophthalmic suspension Place 1 drop into the left eye 2 (two) times daily.  . RESTASIS 0.05 % ophthalmic emulsion Place 1 drop into the left eye 2 (two) times daily.   No current facility-administered medications for this visit. (Ophthalmic Drugs)   Current Outpatient Medications (Other)  Medication Sig  . ALPRAZolam (XANAX) 0.25 MG tablet Take 0.25 mg by mouth at bedtime.  Marland Kitchen amLODipine (NORVASC) 2.5 MG tablet TAKE 1 TABLET DAILY (NEED AN APPOINTMENT)  . aspirin 81 MG tablet Take 162 mg by mouth daily.   Marland Kitchen atenolol (TENORMIN) 50 MG tablet Take 25 mg by mouth daily.   . Cholecalciferol (VITAMIN D3 PO) Take 5,000 Int'l Units by mouth daily.  .  citalopram (CELEXA) 20 MG tablet TAKE 1 TABLET DAILY (NEED AN APPOINTMENT)  . cyclobenzaprine (FLEXERIL) 10 MG tablet Take 10 mg by mouth 3 (three) times daily.  Marland Kitchen donepezil (ARICEPT) 10 MG tablet Take 10 mg by mouth daily.  . fluticasone (FLONASE) 50 MCG/ACT nasal spray USE 1 SPRAY IN EACH NOSTRIL DAILY (NEED APPOINTMENT)  . levothyroxine (SYNTHROID) 50 MCG tablet Take 50 mcg by mouth daily.  Marland Kitchen lisinopril (PRINIVIL,ZESTRIL) 30 MG tablet TAKE 1 TABLET DAILY (NEED AN APPOINTMENT)  . meloxicam (MOBIC) 7.5 MG tablet   . methylPREDNISolone (MEDROL) 4 MG tablet Take as directed  . omeprazole (PRILOSEC) 20 MG capsule TAKE 1 CAPSULE DAILY  . pravastatin (PRAVACHOL) 20 MG tablet TAKE 1 TABLET DAILY (NEED AN APPOINTMENT)  . Probiotic Product (PROBIOTIC PO) Take by mouth.   No current facility-administered medications for this visit. (Other)      REVIEW OF SYSTEMS: ROS    Positive for: Endocrine, Eyes   Negative for: Constitutional, Gastrointestinal, Neurological, Skin, Genitourinary, Musculoskeletal, HENT, Cardiovascular, Respiratory, Psychiatric, Allergic/Imm, Heme/Lymph   Last edited by Doneen Poisson on 03/21/2019  9:04 AM. (History)       ALLERGIES Allergies  Allergen Reactions  . Codeine Other (See Comments)    Makes her crazy    PAST MEDICAL HISTORY Past Medical History:  Diagnosis Date  . Acid reflux   .  Anemia   . Arthropathy   . Cognitive decline    referred to neurologist  . Depression 10/07/2013   daughter didn't know anything about this  . Dizziness   . GERD (gastroesophageal reflux disease) 05/04/2013  . Hip fx ([place])    r hip, bilat pubis rami  . Hyperlipemia   . Hypertension   . Hypothyroidism   . Insomnia   . Multilevel degenerative disc disease   . Palpitations 05/04/2013   saw cardiologist in 2015, he deleted A. Fib from the record as per note not documentation to support  . Short-term memory loss   . Vitamin D deficiency    Past Surgical History:   Procedure Laterality Date  . ABDOMINAL HYSTERECTOMY  1971  . BLADDER SURGERY  2010  . Broken Pelvis  2015  . C-EYE SURGERY PROCEDURE Bilateral    zaldivar  . CATARACT EXTRACTION Bilateral    Done in [place]  . HIP PINNING,CANNULATED Right 10/07/2013   Procedure: CANNULATED screws right hip;  Surgeon: Mcarthur Rossetti, MD;  Location: [facility];  Service: Orthopedics;  Laterality: Right;    FAMILY HISTORY Family History  Problem Relation Age of Onset  . Prostate cancer Father   . Heart attack Sister   . Cancer Brother   . Cancer Sister   . Heart attack Sister   . Cancer Sister   . Stroke Daughter   . Dementia Neg Hx   . Alzheimer's disease Neg Hx     SOCIAL HISTORY Social History   Tobacco Use  . Smoking status: Former Smoker    Types: Cigarettes    Quit date: 08/02/1970    Years since quitting: 48.6  . Smokeless tobacco: Never Used  . Tobacco comment: "smoked very little"  Substance Use Topics  . Alcohol use: Never    Alcohol/week: 0.0 standard drinks  . Drug use: Never         OPHTHALMIC EXAM:  Base Eye Exam    Visual Acuity (Snellen - Linear)      Right Left   Dist [place] 20/25 -2 20/70 -2   Dist ph [place] NI 20/50 -2       Tonometry (Tonopen, 9:09 AM)      Right Left   Pressure 17 15       Pupils      Dark Light Shape React APD   Right 2 1 Round Minimal 0   Left 2 1 Round Minimal 0       Visual Fields      Left Right    Full Full       Extraocular Movement      Right Left    Full Full       Neuro/Psych    Oriented x3: Yes   Mood/Affect: Normal       Dilation    Both eyes: 1.0% Mydriacyl, 2.5% Phenylephrine @ 9:09 AM        Slit Lamp and Fundus Exam    Slit Lamp Exam      Right Left   Lids/Lashes Dermatochalasis - upper lid Dermatochalasis - upper lid   Conjunctiva/Sclera White and quiet White and quiet   Cornea Arcus, 1-2+ Punctate epithelial erosions, mild EBMD and corneal haze Arcus, 3+ Punctate epithelial erosions, EBMD,  irregular epi   Anterior Chamber Deep and quiet Deep and quiet   Iris Round and dilated Round and moderately dilated to 31m   Lens PC IOL in good position with open PC PC IOL  in good position with open PC   Vitreous Vitreous syneresis, Posterior vitreous detachment Vitreous syneresis; PVD; vitreous condensations       Fundus Exam      Right Left   Disc Mild temporal Pallor, Peripapillary atrophy, Sharp rim, Compact mild tilt, 2+ temporal Pallor, Peripapillary atrophy, Compact, Sharp rim   C/D Ratio 0.1 0.0   Macula Flat, Blunted foveal reflex, mild Epiretinal membrane, drusen, RPE mottling and clumping, No heme or edema Flat, Blunted foveal reflex, RPE mottling and clumping, Epiretinal membrane, No heme or edema   Vessels Vascular attenuation, Tortuous Vascular attenuation, Tortuous   Periphery bullous IT schisis cavity from 0700-0830 extending posteriorly almost to arcades otherwise attached -- stable from prior, focal pigmented CR scar at 0630 persistent temporal sub-retinal hemorrhage - red heme shrinking and turning white; +SRF temporally - stably resolved          IMAGING AND PROCEDURES  Imaging and Procedures for '@TODAY'$ @  OCT, Retina - OU - Both Eyes       Right Eye Quality was good. Central Foveal Thickness: 351. Progression has been stable. Findings include epiretinal membrane, normal foveal contour, no IRF, no SRF, retinal drusen , pigment epithelial detachment.   Left Eye Quality was good. Central Foveal Thickness: 328. Progression has been stable. Findings include no IRF, epiretinal membrane, retinal drusen , preretinal fibrosis, abnormal foveal contour, no SRF (Stable improvement in SRF -- still not visible on macular widescan).   Notes *Images captured and stored on drive  Diagnosis / Impression:  NFP, no IRF/SRF centrally OU OS: stable improvement in temporal SRF -- no SRF on widefield images  Clinical management:  See below  Abbreviations: NFP - Normal foveal  profile. CME - cystoid macular edema. PED - pigment epithelial detachment. IRF - intraretinal fluid. SRF - subretinal fluid. EZ - ellipsoid zone. ERM - epiretinal membrane. ORA - outer retinal atrophy. ORT - outer retinal tubulation. SRHM - subretinal hyper-reflective material        Intravitreal Injection, Pharmacologic Agent - OS - Left Eye       Time Out 03/21/2019. 9:06 AM. Confirmed correct patient, procedure, site, and patient consented.   Anesthesia Topical anesthesia was used. Anesthetic medications included Lidocaine 2%, Proparacaine 0.5%.   Procedure Preparation included 5% betadine to ocular surface, eyelid speculum. A (32g) needle was used.   Injection:  2 mg aflibercept Alfonse Flavors) SOLN   NDC: A3590391, Lot: [id number], Expiration date: 08/30/2019   Route: Intravitreal, Site: Left Eye, Waste: 0.05 mL  Post-op Post injection exam found visual acuity of at least counting fingers. The patient tolerated the procedure well. There were no complications. The patient received written and verbal post procedure care education.        Color Fundus Photography Optos - OU - Both Eyes       Right Eye Progression has been stable. Disc findings include normal observations. Macula : retinal pigment epithelium abnormalities. Vessels : attenuated, tortuous vessels. Periphery : RPE abnormality (IT schisis -- stable from prior).   Left Eye Progression has been stable. Disc findings include normal observations. Macula : normal observations. Vessels : attenuated. Periphery : RPE abnormality, hemorrhage (improvement in sub retinal hemorrhage temporally (0200)).   Notes **Images stored on drive**  Impression: OD: bullous retinoschisis, IT periphery -- stable from prior OS: PEHCR with improved sub retinal hemorrhage temporally (0200)                  ASSESSMENT/PLAN:    ICD-10-CM   1.  Exudative age-related macular degeneration of left eye with active choroidal  neovascularization (HCC)  H35.3221 Intravitreal Injection, Pharmacologic Agent - OS - Left Eye    aflibercept (EYLEA) SOLN 2 mg  2. Subretinal hemorrhage of left eye  H35.62 Color Fundus Photography Optos - OU - Both Eyes  3. Serous retinal detachment of left eye  H33.22   4. Retinal edema  H35.81 OCT, Retina - OU - Both Eyes  5. 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 Essential hypertension  I10   8. Hypertensive retinopathy of both eyes  H35.033   9. Pseudophakia of both eyes  Z96.1     1-4. 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 w/ shallow SRF/serous RD  - initially presented with symptomatic floaters OS  - initial exam showed large peripheral temporal subretinal hemorrhage OS -- spanning 2-430 -- also mild vitreous opacities and condensations that were causing symptomatic floaters  - s/p IVA OS #1 (08.05.20) for subretinal hemorrhage, peripheral CNV  - s/p IVE OS #1 (09.02.20) - sample, #2 (10.12.20), #3 (11.11.20), #4 (12.09.20), #5 (1.20.21), #6 (02.17.21)  - today BCVA 20/50 OS  - exam shows stable improvement in subretinal heme and shallow SRF  - repeat color Optos image obtained -- peripheral SRH continues to improve  - OCT shows stable improvement in SRF -- not visible on widefield OCT   - differential includes choroidal melanoma or other subretinal mass -- pt with +history of skin melanoma  - B-scan (9.18.20) showed hyperreflective, ill-defined mass, 0300 periphery OS  - was seen by Dr. Daralene Milch, Ocular Oncologist, on 9.22.2020 -- no tumor on exam or b-scan  - recommend IVE OS #7 today, 03.19.21 -- for exudative ARMD w/ peripheral subretinal heme -- with extension to 5-6 weeks  - pt wishes to proceed  - RBA of procedure discussed, questions answered  - informed consent obtained  - Eylea informed consent form signed and scanned on 01.20.2021  - see procedure note  -  Eylea4U Benefits investigation initiated 09.02.2020 -- approved for 2021  - f/u in 5-6 wks, sooner prn, OCT/DFE/optos colors, possible injection  5. Epiretinal membrane, OU.  - mild ERM OU  - asymptomatic, no metamorphopsia  - no indication for surgery at this time  - monitor for now  6. Retinoschisis OD -- stable  - bullous schisis cavity, IT quadrant, from 0700-0830, extends posteriorly almost to arcades  - no associated RT/RD  - stable from prior  - discussed findings and prognosis  - recommend monitoring for now  - if progressing posteriorly, consider intervention / laser retinopexy  7,8. Hypertensive retinopathy OU  - discussed importance of tight BP control  - monitor  9. Pseudophakia OU  - s/p CE/IOL OU  - beautiful surgery, doing well  - monitor   Ophthalmic Meds Ordered this visit:  Meds ordered this encounter  Medications  . aflibercept (EYLEA) SOLN 2 mg       Return for f/u 5-6 weeks exu ARMD OS, DFE, OCT, OPTOS colors.  There are no Patient Instructions on file for this visit.   Explained the diagnoses, plan, and follow up with the patient and they expressed understanding.  Patient expressed understanding of the importance of proper follow up care.  This document serves as a record of services personally performed by Gardiner Sleeper, MD, PhD. It was created on their behalf by Leeann Must, [place],  a certified ophthalmic assistant. The creation of this record is the provider's dictation and/or activities during the visit.    Electronically signed by: Leeann Must, COA '@TODAY'$ @ 9:54 AM  This document serves as a record of services personally performed by Gardiner Sleeper, MD, PhD. It was created on their behalf by Ernest Mallick, OA, an ophthalmic assistant. The creation of this record is the provider's dictation and/or activities during the visit.    Electronically signed by: Ernest Mallick, OA 03.18.2021 9:54 AM  Gardiner Sleeper, M.D., Ph.D. Diseases &  Surgery of the Retina and Vitreous [facility]  I have reviewed the above documentation for accuracy and completeness, and I agree with the above. Gardiner Sleeper, M.D., Ph.D. 03/21/19 9:54 AM   Abbreviations: M myopia (nearsighted); A astigmatism; H hyperopia (farsighted); P presbyopia; Mrx spectacle prescription;  CTL contact lenses; OD right eye; OS left eye; OU both eyes  XT exotropia; ET esotropia; PEK punctate epithelial keratitis; PEE punctate epithelial erosions; DES dry eye syndrome; MGD meibomian gland dysfunction; ATs artificial tears; PFAT's preservative free artificial tears; [place] nuclear sclerotic cataract; PSC posterior subcapsular cataract; ERM epi-retinal membrane; PVD posterior vitreous detachment; RD retinal detachment; DM diabetes mellitus; DR diabetic retinopathy; NPDR non-proliferative diabetic retinopathy; PDR proliferative diabetic retinopathy; CSME clinically significant macular edema; DME diabetic macular edema; dbh dot blot hemorrhages; CWS cotton wool spot; POAG primary open angle glaucoma; C/D cup-to-disc ratio; HVF humphrey visual field; GVF goldmann visual field; OCT optical coherence tomography; IOP intraocular pressure; BRVO Branch retinal vein occlusion; CRVO central retinal vein occlusion; CRAO central retinal artery occlusion; BRAO branch retinal artery occlusion; RT retinal tear; SB scleral buckle; PPV pars plana vitrectomy; VH Vitreous hemorrhage; PRP panretinal laser photocoagulation; IVK intravitreal kenalog; VMT vitreomacular traction; MH Macular hole;  NVD neovascularization of the disc; NVE neovascularization elsewhere; AREDS age related eye disease study; ARMD age related macular degeneration; POAG primary open angle glaucoma; EBMD epithelial/anterior basement membrane dystrophy; ACIOL anterior chamber intraocular lens; IOL intraocular lens; PCIOL posterior chamber intraocular lens; Phaco/IOL phacoemulsification with intraocular lens placement;  [place] photorefractive keratectomy; LASIK laser assisted in situ keratomileusis; HTN hypertension; DM diabetes mellitus; COPD chronic obstructive pulmonary disease

## 2019-03-19 ENCOUNTER — Ambulatory Visit: Payer: Medicare Other | Attending: Internal Medicine

## 2019-03-19 DIAGNOSIS — Z23 Encounter for immunization: Secondary | ICD-10-CM

## 2019-03-19 NOTE — Progress Notes (Signed)
   Covid-19 Vaccination Clinic  Name:  Tracey Rosario    MRN: [id number] DOB: [birth year]  03/19/2019  [name] was observed post Covid-19 immunization for 15 minutes without incident. She was provided with Vaccine Information Sheet and instruction to access the V-Safe system.   [name] was instructed to call 911 with any severe reactions post vaccine: Marland Kitchen Difficulty breathing  . Swelling of face and throat  . A fast heartbeat  . A bad rash all over body  . Dizziness and weakness   Immunizations Administered    Name Date Dose VIS Date Route   Pfizer COVID-19 Vaccine 03/19/2019 10:33 AM 0.3 mL 12/14/2018 Intramuscular   Manufacturer: [employer], [employer]   Lot: [id number]   NDC: [id number]

## 2019-03-21 ENCOUNTER — Ambulatory Visit (INDEPENDENT_AMBULATORY_CARE_PROVIDER_SITE_OTHER): Payer: Medicare Other | Admitting: Ophthalmology

## 2019-03-21 ENCOUNTER — Encounter (INDEPENDENT_AMBULATORY_CARE_PROVIDER_SITE_OTHER): Payer: Self-pay | Admitting: Ophthalmology

## 2019-03-21 DIAGNOSIS — H3322 Serous retinal detachment, left eye: Secondary | ICD-10-CM

## 2019-03-21 DIAGNOSIS — H3581 Retinal edema: Secondary | ICD-10-CM

## 2019-03-21 DIAGNOSIS — H33101 Unspecified retinoschisis, right eye: Secondary | ICD-10-CM

## 2019-03-21 DIAGNOSIS — H35373 Puckering of macula, bilateral: Secondary | ICD-10-CM

## 2019-03-21 DIAGNOSIS — H3562 Retinal hemorrhage, left eye: Secondary | ICD-10-CM | POA: Diagnosis not present

## 2019-03-21 DIAGNOSIS — I10 Essential (primary) hypertension: Secondary | ICD-10-CM

## 2019-03-21 DIAGNOSIS — Z961 Presence of intraocular lens: Secondary | ICD-10-CM

## 2019-03-21 DIAGNOSIS — H353221 Exudative age-related macular degeneration, left eye, with active choroidal neovascularization: Secondary | ICD-10-CM

## 2019-03-21 DIAGNOSIS — H35033 Hypertensive retinopathy, bilateral: Secondary | ICD-10-CM

## 2019-03-21 MED ORDER — AFLIBERCEPT 2MG/0.05ML IZ SOLN FOR KALEIDOSCOPE
2.0000 mg | INTRAVITREAL | Status: AC | PRN
  Administered 2019-03-21: 2 mg via INTRAVITREAL

## 2019-04-03 ENCOUNTER — Ambulatory Visit (INDEPENDENT_AMBULATORY_CARE_PROVIDER_SITE_OTHER): Payer: Medicare Other

## 2019-04-03 ENCOUNTER — Ambulatory Visit (INDEPENDENT_AMBULATORY_CARE_PROVIDER_SITE_OTHER): Payer: Medicare Other | Admitting: Orthopaedic Surgery

## 2019-04-03 ENCOUNTER — Encounter: Payer: Self-pay | Admitting: Physician Assistant

## 2019-04-03 ENCOUNTER — Other Ambulatory Visit: Payer: Self-pay

## 2019-04-03 DIAGNOSIS — M25551 Pain in right hip: Secondary | ICD-10-CM

## 2019-04-03 DIAGNOSIS — S39012A Strain of muscle, fascia and tendon of lower back, initial encounter: Secondary | ICD-10-CM

## 2019-04-03 MED ORDER — PREDNISONE 5 MG (21) PO TBPK: ORAL_TABLET | ORAL | 0 refills | Status: DC

## 2019-04-03 MED ORDER — METHOCARBAMOL 500 MG PO TABS: 500.0000 mg | ORAL_TABLET | Freq: Two times a day (BID) | ORAL | 0 refills | Status: DC | PRN

## 2019-04-03 NOTE — Progress Notes (Signed)
Office Visit Note   Patient: Tracey Rosario           Date of Birth: 08-16-1926           MRN: [id number] Visit Date: 04/03/2019              Requested by: Courtney Paris, NP [address] [place],  [place] [address] PCP: Courtney Paris, NP   Assessment & Plan: Visit Diagnoses:  1. Lumbar strain, initial encounter   2. Pain in right hip     Plan: Impression is right-sided lumbar strain.  I will start the patient on a steroid taper and muscle relaxer.  She has been instructed to call Dr. Magnus Ivan should her symptoms not improve.  Call with concerns or questions in meantime.  Follow-Up Instructions: Return if symptoms worsen or fail to improve.   Orders:  Orders Placed This Encounter  Procedures  . XR HIP UNILAT W OR W/O PELVIS 2-3 VIEWS RIGHT  . XR Lumbar Spine 2-3 Views   Meds ordered this encounter  Medications  . predniSONE (STERAPRED UNI-PAK 21 TAB) 5 MG (21) TBPK tablet    Sig: Take as directed    Dispense:  21 tablet    Refill:  0  . methocarbamol (ROBAXIN) 500 MG tablet    Sig: Take 1 tablet (500 mg total) by mouth 2 (two) times daily as needed.    Dispense:  20 tablet    Refill:  0      Procedures: No procedures performed   Clinical Data: No additional findings.   Subjective: Chief Complaint  Patient presents with  . Right Hip - Pain  . Lower Back - Pain    HPI patient is a pleasant 86 year old female who presents to our clinic today with concerns about her right hip.  She is proximately 5-1/2 years out right hip pinning by Dr. Magnus Ivan.  Doing well until recently.  She has noticed increased pain to her right lower back and lateral hip over the past week which worsened yesterday after bending over to pick something up with the grocery store.  Pain is described as a soreness worse with forward flexion of the lumbar spine.  No new weakness to the lower extremities.  No new numbness, tingling or burning and no bowel or bladder incontinence or saddle  paresthesias.  Review of Systems as detailed in HPI.  All others reviewed and are negative.   Objective: Vital Signs: There were no vitals taken for this visit.  Physical Exam well-developed well-nourished female no acute distress.  Alert oriented x3.  Ortho Exam examination of her right lower extremity reveals negative logroll and negative FADIR.  Negative straight leg raise.  She has mild tenderness to the right paraspinous musculature.  No spinous tenderness.  Minimal tenderness over the greater trochanter.  No focal weakness.  She is neurovascular intact distally.  Specialty Comments:  No specialty comments available.  Imaging: XR HIP UNILAT W OR W/O PELVIS 2-3 VIEWS RIGHT  Result Date: 04/03/2019 X-rays demonstrate stable the pending with no new fracture  XR Lumbar Spine 2-3 Views  Result Date: 04/03/2019 X-rays demonstrate significant disc space narrowing L5-S1    PMFS History: Patient Active Problem List   Diagnosis Date Noted  . Age-related memory disorder 11/12/2018  . Other osteoporosis without current pathological fracture 01/28/2016  . Mild cognitive impairment 10/15/2015  . Esophageal reflux 06/11/2015  . Small vessel disease (HCC) 04/30/2015  . Essential hypertension 04/07/2015  . Tremor 04/07/2015  .  Major depressive disorder with single episode, in full remission ([place]) 03/30/2015  . Absolute anemia 03/30/2015  . Asymptomatic PVCs 08/01/2013  . Hyperlipidemia 06/04/2013   Past Medical History:  Diagnosis Date  . Acid reflux   . Anemia   . Arthropathy   . Cognitive decline    referred to neurologist  . Depression 10/07/2013   daughter didn't know anything about this  . Dizziness   . GERD (gastroesophageal reflux disease) 05/04/2013  . Hip fx ([place])    r hip, bilat pubis rami  . Hyperlipemia   . Hypertension   . Hypothyroidism   . Insomnia   . Multilevel degenerative disc disease   . Palpitations 05/04/2013   saw cardiologist in 2015, he deleted A.  Fib from the record as per note not documentation to support  . Short-term memory loss   . Vitamin D deficiency     Family History  Problem Relation Age of Onset  . Prostate cancer Father   . Heart attack Sister   . Cancer Brother   . Cancer Sister   . Heart attack Sister   . Cancer Sister   . Stroke Daughter   . Dementia Neg Hx   . Alzheimer's disease Neg Hx     Past Surgical History:  Procedure Laterality Date  . ABDOMINAL HYSTERECTOMY  1971  . BLADDER SURGERY  2010  . Broken Pelvis  2015  . C-EYE SURGERY PROCEDURE Bilateral    zaldivar  . CATARACT EXTRACTION Bilateral    Done in [place]  . HIP PINNING,CANNULATED Right 10/07/2013   Procedure: CANNULATED screws right hip;  Surgeon: Mcarthur Rossetti, MD;  Location: [facility];  Service: Orthopedics;  Laterality: Right;   Social History   Occupational History  . Occupation: Retired  Tobacco Use  . Smoking status: Former Smoker    Types: Cigarettes    Quit date: 08/02/1970    Years since quitting: 48.7  . Smokeless tobacco: Never Used  . Tobacco comment: "smoked very little"  Substance and Sexual Activity  . Alcohol use: Never    Alcohol/week: 0.0 standard drinks  . Drug use: Never  . Sexual activity: Not on file

## 2019-04-22 ENCOUNTER — Observation Stay (HOSPITAL_COMMUNITY)
Admission: EM | Admit: 2019-04-22 | Discharge: 2019-04-23 | Disposition: A | Discharge status: Home / Self Care | Payer: Medicare Other | Attending: Cardiology | Admitting: Cardiology

## 2019-04-22 ENCOUNTER — Emergency Department (HOSPITAL_COMMUNITY): Payer: Medicare Other

## 2019-04-22 DIAGNOSIS — R001 Bradycardia, unspecified: Principal | ICD-10-CM | POA: Diagnosis present

## 2019-04-22 DIAGNOSIS — Z79890 Hormone replacement therapy (postmenopausal): Secondary | ICD-10-CM | POA: Diagnosis not present

## 2019-04-22 DIAGNOSIS — I160 Hypertensive urgency: Secondary | ICD-10-CM | POA: Insufficient documentation

## 2019-04-22 DIAGNOSIS — Z791 Long term (current) use of non-steroidal anti-inflammatories (NSAID): Secondary | ICD-10-CM | POA: Insufficient documentation

## 2019-04-22 DIAGNOSIS — Z20822 Contact with and (suspected) exposure to covid-19: Secondary | ICD-10-CM | POA: Insufficient documentation

## 2019-04-22 DIAGNOSIS — Z7982 Long term (current) use of aspirin: Secondary | ICD-10-CM | POA: Insufficient documentation

## 2019-04-22 DIAGNOSIS — K219 Gastro-esophageal reflux disease without esophagitis: Secondary | ICD-10-CM | POA: Diagnosis not present

## 2019-04-22 DIAGNOSIS — I071 Rheumatic tricuspid insufficiency: Secondary | ICD-10-CM | POA: Diagnosis not present

## 2019-04-22 DIAGNOSIS — W1830XA Fall on same level, unspecified, initial encounter: Secondary | ICD-10-CM | POA: Diagnosis not present

## 2019-04-22 DIAGNOSIS — F329 Major depressive disorder, single episode, unspecified: Secondary | ICD-10-CM | POA: Insufficient documentation

## 2019-04-22 DIAGNOSIS — S0990XA Unspecified injury of head, initial encounter: Secondary | ICD-10-CM | POA: Insufficient documentation

## 2019-04-22 DIAGNOSIS — E039 Hypothyroidism, unspecified: Secondary | ICD-10-CM | POA: Diagnosis not present

## 2019-04-22 DIAGNOSIS — E785 Hyperlipidemia, unspecified: Secondary | ICD-10-CM | POA: Diagnosis present

## 2019-04-22 DIAGNOSIS — Z79899 Other long term (current) drug therapy: Secondary | ICD-10-CM | POA: Insufficient documentation

## 2019-04-22 DIAGNOSIS — D649 Anemia, unspecified: Secondary | ICD-10-CM | POA: Diagnosis not present

## 2019-04-22 DIAGNOSIS — I10 Essential (primary) hypertension: Secondary | ICD-10-CM | POA: Diagnosis not present

## 2019-04-22 DIAGNOSIS — Z87891 Personal history of nicotine dependence: Secondary | ICD-10-CM | POA: Diagnosis not present

## 2019-04-22 DIAGNOSIS — R55 Syncope and collapse: Secondary | ICD-10-CM

## 2019-04-22 LAB — CBC WITH DIFFERENTIAL/PLATELET
Abs Immature Granulocytes: 0.02 10*3/uL (ref 0.00–0.07)
Basophils Absolute: 0 10*3/uL (ref 0.0–0.1)
Basophils Relative: 1 %
Eosinophils Absolute: 0.1 10*3/uL (ref 0.0–0.5)
Eosinophils Relative: 1 %
HCT: 41.7 % (ref 36.0–46.0)
Hemoglobin: 13.4 g/dL (ref 12.0–15.0)
Immature Granulocytes: 0 %
Lymphocytes Relative: 17 %
Lymphs Abs: 0.9 10*3/uL (ref 0.7–4.0)
MCH: 29.2 pg (ref 26.0–34.0)
MCHC: 32.1 g/dL (ref 30.0–36.0)
MCV: 90.8 fL (ref 80.0–100.0)
Monocytes Absolute: 0.5 10*3/uL (ref 0.1–1.0)
Monocytes Relative: 8 %
Neutro Abs: 4.1 10*3/uL (ref 1.7–7.7)
Neutrophils Relative %: 73 %
Platelets: 218 10*3/uL (ref 150–400)
RBC: 4.59 MIL/uL (ref 3.87–5.11)
RDW: 12.6 % (ref 11.5–15.5)
WBC: 5.6 10*3/uL (ref 4.0–10.5)
nRBC: 0 % (ref 0.0–0.2)

## 2019-04-22 LAB — COMPREHENSIVE METABOLIC PANEL
ALT: 22 U/L (ref 0–44)
AST: 25 U/L (ref 15–41)
Albumin: 4.3 g/dL (ref 3.5–5.0)
Alkaline Phosphatase: 41 U/L (ref 38–126)
Anion gap: 12 (ref 5–15)
BUN: 23 mg/dL (ref 8–23)
CO2: 26 mmol/L (ref 22–32)
Calcium: 9.8 mg/dL (ref 8.9–10.3)
Chloride: 102 mmol/L (ref 98–111)
Creatinine, Ser: 1.26 mg/dL — ABNORMAL HIGH (ref 0.44–1.00)
GFR calc Af Amer: 43 mL/min — ABNORMAL LOW (ref >60)
GFR calc non Af Amer: 37 mL/min — ABNORMAL LOW (ref >60)
Glucose, Bld: 108 mg/dL — ABNORMAL HIGH (ref 70–99)
Potassium: 4.2 mmol/L (ref 3.5–5.1)
Sodium: 140 mmol/L (ref 135–145)
Total Bilirubin: 0.8 mg/dL (ref 0.3–1.2)
Total Protein: 6.9 g/dL (ref 6.5–8.1)

## 2019-04-22 LAB — TROPONIN I (HIGH SENSITIVITY)
Troponin I (High Sensitivity): 9 ng/L (ref <18)
Troponin I (High Sensitivity): 8 ng/L

## 2019-04-22 LAB — MAGNESIUM: Magnesium: 2.1 mg/dL (ref 1.7–2.4)

## 2019-04-22 LAB — BRAIN NATRIURETIC PEPTIDE: B Natriuretic Peptide: 262.7 pg/mL — ABNORMAL HIGH (ref 0.0–100.0)

## 2019-04-22 MED ORDER — LISINOPRIL 20 MG PO TABS
30.0000 mg | ORAL_TABLET | Freq: Every day | ORAL | Status: DC
  Administered 2019-04-22: 30 mg via ORAL
  Filled 2019-04-22 (×2): qty 1

## 2019-04-22 MED ORDER — LEVOTHYROXINE SODIUM 50 MCG PO TABS
50.0000 ug | ORAL_TABLET | Freq: Every day | ORAL | Status: DC
  Administered 2019-04-23: 50 ug via ORAL
  Filled 2019-04-22: qty 1

## 2019-04-22 MED ORDER — DONEPEZIL HCL 5 MG PO TABS
10.0000 mg | ORAL_TABLET | Freq: Every day | ORAL | Status: DC
  Administered 2019-04-23: 10 mg via ORAL
  Filled 2019-04-22: qty 2

## 2019-04-22 MED ORDER — ALPRAZOLAM 0.25 MG PO TABS
0.2500 mg | ORAL_TABLET | Freq: Every day | ORAL | Status: DC
  Administered 2019-04-23: 0.25 mg via ORAL
  Filled 2019-04-22 (×2): qty 1

## 2019-04-22 MED ORDER — ONDANSETRON HCL 4 MG/2ML IJ SOLN: 4.0000 mg | Freq: Four times a day (QID) | INTRAMUSCULAR | Status: DC | PRN

## 2019-04-22 MED ORDER — AMLODIPINE BESYLATE 5 MG PO TABS
5.0000 mg | ORAL_TABLET | Freq: Every day | ORAL | Status: DC
  Administered 2019-04-22: 5 mg via ORAL
  Filled 2019-04-22 (×2): qty 1

## 2019-04-22 MED ORDER — ASPIRIN 81 MG PO CHEW
81.0000 mg | CHEWABLE_TABLET | Freq: Once | ORAL | Status: AC
  Administered 2019-04-22: 81 mg via ORAL
  Filled 2019-04-22: qty 1

## 2019-04-22 MED ORDER — HEPARIN SODIUM (PORCINE) 5000 UNIT/ML IJ SOLN
5000.0000 [IU] | Freq: Three times a day (TID) | INTRAMUSCULAR | Status: DC
  Administered 2019-04-22 – 2019-04-23 (×2): 5000 [IU] via SUBCUTANEOUS
  Filled 2019-04-22 (×2): qty 1

## 2019-04-22 MED ORDER — PRAVASTATIN SODIUM 10 MG PO TABS: 20.0000 mg | ORAL_TABLET | Freq: Every day | ORAL | Status: DC

## 2019-04-22 MED ORDER — ACETAMINOPHEN 500 MG PO TABS
500.0000 mg | ORAL_TABLET | Freq: Once | ORAL | Status: AC
  Administered 2019-04-22: 500 mg via ORAL

## 2019-04-22 MED ORDER — ACETAMINOPHEN 325 MG PO TABS: 650.0000 mg | ORAL_TABLET | ORAL | Status: DC | PRN

## 2019-04-22 NOTE — ED Provider Notes (Signed)
[facility] EMERGENCY DEPARTMENT Provider Note   CSN: [id number] Arrival date & time: 04/22/19  1603     History Chief Complaint  Patient presents with  . Loss of Consciousness  . Bradycardia    Tracey Rosario is a 86 y.o. female.  Presents to emergency room with chief complaint of.  Patient reports that she was going to her daughter's house when she was walking and suddenly felt flushed, lightheaded feeling, then passed out.  Unsure if near syncope or full syncope.  Denies any head trauma but is also unsure of this.  Has had mild dull headache.  Denies any chest pain or difficulty breathing, no abdominal pain, no neck or back pain.  Past medical history notable for hypertension managed on amlodipine and atenolol.  HPI     Past Medical History:  Diagnosis Date  . Acid reflux   . Anemia   . Arthropathy   . Cognitive decline    referred to neurologist  . Depression 10/07/2013   daughter didn't know anything about this  . Dizziness   . GERD (gastroesophageal reflux disease) 05/04/2013  . Hip fx (HCC)    r hip, bilat pubis rami  . Hyperlipemia   . Hypertension   . Hypothyroidism   . Insomnia   . Multilevel degenerative disc disease   . Palpitations 05/04/2013   saw cardiologist in 2015, he deleted A. Fib from the record as per note not documentation to support  . Short-term memory loss   . Vitamin D deficiency     Patient Active Problem List   Diagnosis Date Noted  . Bradycardia 04/22/2019  . Age-related memory disorder 11/12/2018  . Other osteoporosis without current pathological fracture 01/28/2016  . Mild cognitive impairment 10/15/2015  . Esophageal reflux 06/11/2015  . Small vessel disease (HCC) 04/30/2015  . Essential hypertension 04/07/2015  . Tremor 04/07/2015  . Major depressive disorder with single episode, in full remission (HCC) 03/30/2015  . Absolute anemia 03/30/2015  . Asymptomatic PVCs 08/01/2013  . Hyperlipidemia  06/04/2013    Past Surgical History:  Procedure Laterality Date  . ABDOMINAL HYSTERECTOMY  1971  . BLADDER SURGERY  2010  . Broken Pelvis  2015  . C-EYE SURGERY PROCEDURE Bilateral    zaldivar  . CATARACT EXTRACTION Bilateral    Done in [place]  . HIP PINNING,CANNULATED Right 10/07/2013   Procedure: CANNULATED screws right hip;  Surgeon: Kathryne Hitch, MD;  Location: [facility] OR;  Service: Orthopedics;  Laterality: Right;     OB History   No obstetric history on file.     Family History  Problem Relation Age of Onset  . Prostate cancer Father   . Heart attack Sister   . Cancer Brother   . Cancer Sister   . Heart attack Sister   . Cancer Sister   . Stroke Daughter   . Dementia Neg Hx   . Alzheimer's disease Neg Hx     Social History   Tobacco Use  . Smoking status: Former Smoker    Types: Cigarettes    Quit date: 08/02/1970    Years since quitting: 48.7  . Smokeless tobacco: Never Used  . Tobacco comment: "smoked very little"  Substance Use Topics  . Alcohol use: Never    Alcohol/week: 0.0 standard drinks  . Drug use: Never    Home Medications Prior to Admission medications   Medication Sig Start Date End Date Taking? Authorizing Provider  ALPRAZolam Prudy Feeler) 0.25 MG tablet Take 0.25  mg by mouth daily.  09/27/18  Yes [provider]  amLODipine (NORVASC) 2.5 MG tablet TAKE 1 TABLET DAILY (NEED AN APPOINTMENT) Patient taking differently: Take 2.5 mg by mouth daily.  07/10/18  Yes [name], DO  aspirin EC 81 MG tablet Take 162 mg by mouth at bedtime.   Yes [provider]  Cholecalciferol (VITAMIN D3) 125 MCG (5000 UT) TABS Take 5,000 Units by mouth daily.    Yes [provider]  donepezil (ARICEPT) 10 MG tablet Take 10 mg by mouth daily. 09/27/18  Yes [provider]  fluticasone (FLONASE) 50 MCG/ACT nasal spray USE 1 SPRAY IN EACH NOSTRIL DAILY (NEED APPOINTMENT) Patient taking differently: Place 1 spray into both  nostrils daily as needed for allergies or rhinitis.  02/12/18  Yes [name], DO  levothyroxine (EUTHYROX) 50 MCG tablet Take 50 mcg by mouth daily before breakfast.   Yes [provider]  lisinopril (PRINIVIL,ZESTRIL) 30 MG tablet TAKE 1 TABLET DAILY (NEED AN APPOINTMENT) Patient taking differently: Take 30 mg by mouth daily.  03/09/18  Yes [name], DO  meloxicam (MOBIC) 7.5 MG tablet Take 7.5 mg by mouth daily.  10/06/18  Yes [provider]  methocarbamol (ROBAXIN) 500 MG tablet Take 1 tablet (500 mg total) by mouth 2 (two) times daily as needed. Patient taking differently: Take 500 mg by mouth at bedtime as needed for muscle spasms.  04/03/19  Yes Cristie Hem, PA-C  omeprazole (PRILOSEC) 20 MG capsule TAKE 1 CAPSULE DAILY Patient taking differently: Take 20 mg by mouth daily.  10/20/17  Yes Kriste Basque R, DO  polyvinyl alcohol (ARTIFICIAL TEARS) 1.4 % ophthalmic solution Place 1 drop into both eyes as needed for dry eyes.   Yes [provider]  Probiotic Product (PROBIOTIC PO) Take 1 tablet by mouth daily.    Yes [provider]  citalopram (CELEXA) 20 MG tablet TAKE 1 TABLET DAILY (NEED AN APPOINTMENT) Patient not taking: Reported on 04/22/2019 07/10/18   Kriste Basque R, DO  pravastatin (PRAVACHOL) 20 MG tablet TAKE 1 TABLET DAILY (NEED AN APPOINTMENT) Patient not taking: Reported on 04/22/2019 03/09/18   [name], DO    Allergies    Codeine  Review of Systems   Review of Systems  Constitutional: Negative for chills and fever.  HENT: Negative for ear pain and sore throat.   Eyes: Negative for pain and visual disturbance.  Respiratory: Negative for cough and shortness of breath.   Cardiovascular: Negative for chest pain and palpitations.  Gastrointestinal: Negative for abdominal pain and vomiting.  Genitourinary: Negative for dysuria and hematuria.  Musculoskeletal: Negative for arthralgias and back pain.  Skin: Negative for color change  and rash.  Neurological: Positive for syncope. Negative for seizures.  All other systems reviewed and are negative.   Physical Exam Updated Vital Signs BP (!) 188/56   Pulse (!) 58   Temp 98.4 F (36.9 C) (Oral)   Resp 13   SpO2 100%   Physical Exam Vitals and nursing note reviewed.  Constitutional:      General: She is not in acute distress.    Appearance: She is well-developed.  HENT:     Head: Normocephalic and atraumatic.  Eyes:     Conjunctiva/sclera: Conjunctivae normal.  Cardiovascular:     Rate and Rhythm: Normal rate and regular rhythm.     Heart sounds: No murmur.  Pulmonary:     Effort: Pulmonary effort is normal. No respiratory distress.  Breath sounds: Normal breath sounds.  Abdominal:     Palpations: Abdomen is soft.     Tenderness: There is no abdominal tenderness.  Musculoskeletal:     Cervical back: Neck supple.     Comments: Back: no C, T, L spine TTP, no step off or deformity RUE: no TTP throughout, no deformity, normal joint ROM, radial pulse intact, distal sensation and motor intact LUE: no TTP throughout, no deformity, normal joint ROM, radial pulse intact, distal sensation and motor intact RLE:  no TTP throughout, no deformity, normal joint ROM, distal pulse, sensation and motor intact LLE: no TTP throughout, no deformity, normal joint ROM, distal pulse, sensation and motor intact  Skin:    General: Skin is warm and dry.  Neurological:     General: No focal deficit present.     Mental Status: She is alert and oriented to person, place, and time.  Psychiatric:        Mood and Affect: Mood normal.        Behavior: Behavior normal.     ED Results / Procedures / Treatments   Labs (all labs ordered are listed, but only abnormal results are displayed) Labs Reviewed  COMPREHENSIVE METABOLIC PANEL - Abnormal; Notable for the following components:      Result Value   Glucose, Bld 108 (*)    Creatinine, Ser 1.26 (*)    GFR calc non Af Amer 37  (*)    GFR calc Af Amer 43 (*)    All other components within normal limits  BRAIN NATRIURETIC PEPTIDE - Abnormal; Notable for the following components:   B Natriuretic Peptide 262.7 (*)    All other components within normal limits  CBC WITH DIFFERENTIAL/PLATELET  MAGNESIUM  CBC  CREATININE, SERUM  TSH  T4, FREE  BASIC METABOLIC PANEL  TROPONIN I (HIGH SENSITIVITY)  TROPONIN I (HIGH SENSITIVITY)  TROPONIN I (HIGH SENSITIVITY)  TROPONIN I (HIGH SENSITIVITY)    EKG EKG Interpretation  Date/Time:  Monday April 22 2019 16:09:22 EDT Ventricular Rate:  41 PR Interval:    QRS Duration: 103 QT Interval:  481 QTC Calculation: 398 R Axis:   49 Text Interpretation: Sinus bradycardia RSR' in V1 or V2, probably normal variant Confirmed by Madalyn Rob [phone]) on 04/22/2019 4:30:25 PM   Radiology DG Chest 2 View  Result Date: 04/22/2019 CLINICAL DATA:  Syncope EXAM: CHEST - 2 VIEW COMPARISON:  10/07/2013 FINDINGS: Biapical pleural-parenchymal scarring. No focal consolidation. No pleural effusion or pneumothorax. The heart is normal in size. Visualized osseous structures are within normal limits. IMPRESSION: Normal chest radiographs. Electronically Signed   By: Julian Hy M.D.   On: 04/22/2019 17:38   CT Head Wo Contrast  Result Date: 04/22/2019 CLINICAL DATA:  Head trauma, fall EXAM: CT HEAD WITHOUT CONTRAST TECHNIQUE: Contiguous axial images were obtained from the base of the skull through the vertex without intravenous contrast. COMPARISON:  05/04/2005 FINDINGS: Brain: No evidence of acute infarction, hemorrhage, hydrocephalus, extra-axial collection or mass lesion/mass effect. Periventricular and deep white matter hypodensity. Vascular: No hyperdense vessel or unexpected calcification. Skull: Normal. Negative for fracture or focal lesion. Sinuses/Orbits: No acute finding. Other: Partially calcified, benign cutaneous lesion of the left occiput, unchanged compared to prior exam.  IMPRESSION: No acute intracranial pathology.  Small-vessel white matter disease. Electronically Signed   By: Eddie Candle M.D.   On: 04/22/2019 19:08    Procedures Procedures (including critical care time)  Medications Ordered in ED Medications  pravastatin (PRAVACHOL) tablet 20  mg (has no administration in time range)  lisinopril (ZESTRIL) tablet 30 mg (30 mg Oral Given 04/22/19 2215)  amLODipine (NORVASC) tablet 5 mg (5 mg Oral Given 04/22/19 2215)  donepezil (ARICEPT) tablet 10 mg (has no administration in time range)  ALPRAZolam Prudy Feeler) tablet 0.25 mg (0.25 mg Oral Not Given 04/22/19 2215)  levothyroxine (SYNTHROID) tablet 50 mcg (has no administration in time range)  acetaminophen (TYLENOL) tablet 650 mg (has no administration in time range)  ondansetron (ZOFRAN) injection 4 mg (has no administration in time range)  heparin injection 5,000 Units (has no administration in time range)  acetaminophen (TYLENOL) tablet 500 mg (500 mg Oral Given 04/22/19 2031)  aspirin chewable tablet 81 mg (81 mg Oral Given 04/22/19 2215)    ED Course  I have reviewed the triage vital signs and the nursing notes.  Pertinent labs & imaging results that were available during my care of the patient were reviewed by me and considered in my medical decision making (see chart for details).  Clinical Course as of Apr 22 2310  Mon Apr 22, 2019  1921 D/w cardiology - they will see pt, arrange out pt f/u, then rec likely dc home, hold atenalol   [RD]  1955 Cardiology has decided to admit for observation   [RD]    Clinical Course User Index [RD] Milagros Loll, MD   MDM Rules/Calculators/A&P                      86 year old lady presented to ER after syncopal episode.  In ER, patient noted to be well-appearing, bradycardic but stable BP.  EKG without ischemic changes, blood work grossly within normal limits.  Given her significant bradycardia, consulted cardiology.  Initially cardiology was recommending  outpatient therapy however after evaluating patient in further discussion, feel patient would benefit from overnight observation.  Suspect her bradycardia may be related to her atenolol use which will be held per cardiology.  Dr. Delton See accepting.    Final Clinical Impression(s) / ED Diagnoses Final diagnoses:  Syncope, unspecified syncope type  Symptomatic bradycardia    Rx / DC Orders ED Discharge Orders    None       Milagros Loll, MD 04/22/19 2313

## 2019-04-22 NOTE — ED Triage Notes (Signed)
Pt brought in from daughter's house via EMS after an unwitnessed syncopal episode. Pt states she felt weak and went to sit down on a chair but instead ended up falling on the ground. Pt was found to be bradycardic in the 40's by EMS. Pt unsure if she hit her head, but does state she has a headache and some R sided shoulder pain.  VS: 178/58, HR 42, RR 18, O2 95% RA CBG: 118.

## 2019-04-22 NOTE — H&P (Addendum)
Cardiology History and Physical:   Patient ID: Tracey Rosario MRN: [id number]; DOB: 08-27-1926  Admit date: 04/22/2019 Date of Consult: 04/22/2019  Primary Care Provider: Courtney Paris, NP Primary Cardiologist: remotely seen by Dr. Rennis Golden in 2015 Primary Electrophysiologist:  None    Patient Profile:   Tracey Rosario is a 86 y.o. female with a hx of hypertension, hypothyroidism, hyperlipidemia, and GERD who is being seen today for the evaluation of bradycardia, possible syncope and uncontrolled hypertension at the request of Dr. Stevie Kern.  History of Present Illness:   Tracey Rosario is a pleasant 86 year old female with past medical history of hypertension, hypothyroidism, hyperlipidemia, and GERD.  Patient moved from [place] to [place] in 2015 to be closer to her daughter who is disabled.  Her other daughter is currently in [place].  She has been on atenolol for many years for suppressing PVCs.  She was evaluated by Dr. Rennis Golden in July 2015 due to frequent PVCs in trigeminy pattern.  Given lack of chest discomfort, Dr. Susette Racer recommended simple echocardiogram as initial evaluation.  Echocardiogram obtained on 08/16/2013 showed EF 60 to 65%, mild AI, mild LAE, mild to moderate TR.  She has not seen cardiology service since.  According to the patient, she has been using her walker in the past several days for balance.  Prior to that, she says she had an episode where she pulled her muscle bending over to pick up something off the ground at a supermarket.  She experienced a sharp pain under her right rib cage.  Since then, she has been using her walker.  She denies any exertional chest pain.  She is currently living with her daughter who is disabled.  Today, after walking into her room, she felt extra heart.  She tried to sit down in the chair however lost her balance and slipped out of the chair.  There was a question of possible syncope.  On arrival to the ED, her blood  pressure was elevated in the 190-200 range.  She denies any chest pain or shortness of breath.  She denies any dizziness, blurred vision or feeling of passing out at this point.  Telemetry shows she is in sinus bradycardia with heart rate in the low 40s.  During our interview, her heart rate went up to 60s while talking.  Cardiology was consulted for bradycardia.   Past Medical History:  Diagnosis Date  . Acid reflux   . Anemia   . Arthropathy   . Cognitive decline    referred to neurologist  . Depression 10/07/2013   daughter didn't know anything about this  . Dizziness   . GERD (gastroesophageal reflux disease) 05/04/2013  . Hip fx (HCC)    r hip, bilat pubis rami  . Hyperlipemia   . Hypertension   . Hypothyroidism   . Insomnia   . Multilevel degenerative disc disease   . Palpitations 05/04/2013   saw cardiologist in 2015, he deleted A. Fib from the record as per note not documentation to support  . Short-term memory loss   . Vitamin D deficiency     Past Surgical History:  Procedure Laterality Date  . ABDOMINAL HYSTERECTOMY  1971  . BLADDER SURGERY  2010  . Broken Pelvis  2015  . C-EYE SURGERY PROCEDURE Bilateral    zaldivar  . CATARACT EXTRACTION Bilateral    Done in [place]  . HIP PINNING,CANNULATED Right 10/07/2013   Procedure: CANNULATED screws right hip;  Surgeon: Kathryne Hitch, MD;  Location: [facility]  OR;  Service: Orthopedics;  Laterality: Right;     Home Medications:  Prior to Admission medications   Medication Sig Start Date End Date Taking? Authorizing Provider  ALPRAZolam (XANAX) 0.25 MG tablet Take 0.25 mg by mouth daily.  09/27/18  Yes [provider]  amLODipine (NORVASC) 2.5 MG tablet TAKE 1 TABLET DAILY (NEED AN APPOINTMENT) Patient taking differently: Take 2.5 mg by mouth daily.  07/10/18  Yes Terressa KoyanagiKim, Hannah R, DO  aspirin EC 81 MG tablet Take 162 mg by mouth at bedtime.   Yes [provider]  atenolol (TENORMIN) 50 MG tablet Take 25 mg by  mouth at bedtime.  09/27/18  Yes [provider]  Cholecalciferol (VITAMIN D3) 125 MCG (5000 UT) TABS Take 5,000 Units by mouth daily.    Yes [provider]  donepezil (ARICEPT) 10 MG tablet Take 10 mg by mouth daily. 09/27/18  Yes [provider]  fluticasone (FLONASE) 50 MCG/ACT nasal spray USE 1 SPRAY IN EACH NOSTRIL DAILY (NEED APPOINTMENT) Patient taking differently: Place 1 spray into both nostrils daily as needed for allergies or rhinitis.  02/12/18  Yes Terressa KoyanagiKim, Hannah R, DO  levothyroxine (EUTHYROX) 50 MCG tablet Take 50 mcg by mouth daily before breakfast.   Yes [provider]  lisinopril (PRINIVIL,ZESTRIL) 30 MG tablet TAKE 1 TABLET DAILY (NEED AN APPOINTMENT) Patient taking differently: Take 30 mg by mouth daily.  03/09/18  Yes Terressa KoyanagiKim, Hannah R, DO  meloxicam (MOBIC) 7.5 MG tablet Take 7.5 mg by mouth daily.  10/06/18  Yes [provider]  methocarbamol (ROBAXIN) 500 MG tablet Take 1 tablet (500 mg total) by mouth 2 (two) times daily as needed. Patient taking differently: Take 500 mg by mouth at bedtime as needed for muscle spasms.  04/03/19  Yes Cristie HemStanbery, Mary L, PA-C  omeprazole (PRILOSEC) 20 MG capsule TAKE 1 CAPSULE DAILY Patient taking differently: Take 20 mg by mouth daily.  10/20/17  Yes Kriste BasqueKim, Hannah R, DO  polyvinyl alcohol (ARTIFICIAL TEARS) 1.4 % ophthalmic solution Place 1 drop into both eyes as needed for dry eyes.   Yes [provider]  Probiotic Product (PROBIOTIC PO) Take 1 tablet by mouth daily.    Yes [provider]  citalopram (CELEXA) 20 MG tablet TAKE 1 TABLET DAILY (NEED AN APPOINTMENT) Patient not taking: Reported on 04/22/2019 07/10/18   Kriste BasqueKim, Hannah R, DO  pravastatin (PRAVACHOL) 20 MG tablet TAKE 1 TABLET DAILY (NEED AN APPOINTMENT) Patient not taking: Reported on 04/22/2019 03/09/18   Terressa KoyanagiKim, Hannah R, DO    Inpatient Medications: Scheduled Meds: . acetaminophen  500 mg Oral Once   Continuous Infusions:  PRN  Meds:   Allergies:    Allergies  Allergen Reactions  . Codeine Other (See Comments)    Makes her crazy    Social History:   Social History   Socioeconomic History  . Marital status: Widowed    Spouse name: Not on file  . Number of children: 2  . Years of education: Not on file  . Highest education level: High school graduate  Occupational History  . Occupation: Retired  Tobacco Use  . Smoking status: Former Smoker    Types: Cigarettes    Quit date: 08/02/1970    Years since quitting: 48.7  . Smokeless tobacco: Never Used  . Tobacco comment: "smoked very little"  Substance and Sexual Activity  . Alcohol use: Never    Alcohol/week: 0.0 standard drinks  . Drug use: Never  . Sexual activity: Not on  file  Other Topics Concern  . Not on file  Social History Narrative   Work or School: none      Home Situation: lives alone, daughter lives about 5 minutes away. One daughter lives across the street (bedridden)      Spiritual Beliefs: baptist      Lifestyle: no regular exercise, diet is ok      3 grand children and 3 great grands; 2 1/2 and 3 months       Caffeine: 1-2 cups of coffee/day   Social Determinants of Health   Financial Resource Strain:   . Difficulty of Paying Living Expenses:   Food Insecurity:   . Worried About Programme researcher, broadcasting/film/video in the Last Year:   . Barista in the Last Year:   Transportation Needs:   . Freight forwarder (Medical):   Marland Kitchen Lack of Transportation (Non-Medical):   Physical Activity:   . Days of Exercise per Week:   . Minutes of Exercise per Session:   Stress:   . Feeling of Stress :   Social Connections:   . Frequency of Communication with Friends and Family:   . Frequency of Social Gatherings with Friends and Family:   . Attends Religious Services:   . Active Member of Clubs or Organizations:   . Attends Banker Meetings:   Marland Kitchen Marital Status:   Intimate Partner Violence:   . Fear of Current or Ex-Partner:    . Emotionally Abused:   Marland Kitchen Physically Abused:   . Sexually Abused:     Family History:    Family History  Problem Relation Age of Onset  . Prostate cancer Father   . Heart attack Sister   . Cancer Brother   . Cancer Sister   . Heart attack Sister   . Cancer Sister   . Stroke Daughter   . Dementia Neg Hx   . Alzheimer's disease Neg Hx      ROS:  Please see the history of present illness.   All other ROS reviewed and negative.     Physical Exam/Data:   Vitals:   04/22/19 1609  BP: (!) 199/61  Pulse: (!) 41  Resp: 18  Temp: 98.4 F (36.9 C)  TempSrc: Oral  SpO2: 99%   No intake or output data in the 24 hours ending 04/22/19 1940 Last 3 Weights 11/12/2018 03/09/2017 02/22/2017  Weight (lbs) 167 lb 164 lb 4.8 oz 162 lb 4 oz  Weight (kg) 75.751 kg 74.526 kg 73.596 kg     There is no height or weight on file to calculate BMI.  General:  Well nourished, well developed, in no acute distress HEENT: normal Lymph: no adenopathy Neck: no JVD Endocrine:  No thryomegaly Vascular: No carotid bruits; FA pulses 2+ bilaterally without bruits  Cardiac:  normal S1, S2; RRR; no murmur  Lungs:  clear to auscultation bilaterally, no wheezing, rhonchi or rales  Abd: soft, nontender, no hepatomegaly  Ext: no edema Musculoskeletal:  No deformities, BUE and BLE strength normal and equal Skin: warm and dry  Neuro:  CNs 2-12 intact, no focal abnormalities noted Psych:  Normal affect   EKG:  The EKG was personally reviewed and demonstrates: Sinus bradycardia, heart rate in the 40s Telemetry:  Telemetry was personally reviewed and demonstrates: Sinus bradycardia, initial heart rate in the 40s, now rate improved to 60s.  Relevant CV Studies:  Echo 08/16/2013 LV EF: 60% -  65%  Study Conclusions   -  Left ventricle: The cavity size was normal. Wall thickness was  normal. Systolic function was normal. The estimated ejection  fraction was in the range of 60% to 65%.  - Aortic valve:  There was mild regurgitation.  - Left atrium: The atrium was mildly dilated.  - Right atrium: The atrium was mildly dilated.  - Tricuspid valve: There was mild-moderate regurgitation.     Laboratory Data:  High Sensitivity Troponin:   Recent Labs  Lab 04/22/19 1631  TROPONINIHS 9     Chemistry Recent Labs  Lab 04/22/19 1631  NA 140  K 4.2  CL 102  CO2 26  GLUCOSE 108*  BUN 23  CREATININE 1.26*  CALCIUM 9.8  GFRNONAA 37*  GFRAA 43*  ANIONGAP 12    Recent Labs  Lab 04/22/19 1631  PROT 6.9  ALBUMIN 4.3  AST 25  ALT 22  ALKPHOS 41  BILITOT 0.8   Hematology Recent Labs  Lab 04/22/19 1631  WBC 5.6  RBC 4.59  HGB 13.4  HCT 41.7  MCV 90.8  MCH 29.2  MCHC 32.1  RDW 12.6  PLT 218   BNP Recent Labs  Lab 04/22/19 1631  BNP 262.7*    DDimer No results for input(s): DDIMER in the last 168 hours.   Radiology/Studies:  DG Chest 2 View  Result Date: 04/22/2019 CLINICAL DATA:  Syncope EXAM: CHEST - 2 VIEW COMPARISON:  10/07/2013 FINDINGS: Biapical pleural-parenchymal scarring. No focal consolidation. No pleural effusion or pneumothorax. The heart is normal in size. Visualized osseous structures are within normal limits. IMPRESSION: Normal chest radiographs. Electronically Signed   By: Julian Hy M.D.   On: 04/22/2019 17:38   CT Head Wo Contrast  Result Date: 04/22/2019 CLINICAL DATA:  Head trauma, fall EXAM: CT HEAD WITHOUT CONTRAST TECHNIQUE: Contiguous axial images were obtained from the base of the skull through the vertex without intravenous contrast. COMPARISON:  05/04/2005 FINDINGS: Brain: No evidence of acute infarction, hemorrhage, hydrocephalus, extra-axial collection or mass lesion/mass effect. Periventricular and deep white matter hypodensity. Vascular: No hyperdense vessel or unexpected calcification. Skull: Normal. Negative for fracture or focal lesion. Sinuses/Orbits: No acute finding. Other: Partially calcified, benign cutaneous lesion of the  left occiput, unchanged compared to prior exam. IMPRESSION: No acute intracranial pathology.  Small-vessel white matter disease. Electronically Signed   By: Eddie Candle M.D.   On: 04/22/2019 19:08   {  Assessment and Plan:   1. Bradycardia:  -I am not entirely confident that her episode today is related to the bradycardia.  She was previously seen by Dr. Debara Pickett in 2015 who noted her heart rate was in the low 50s at the time.  Now her heart rate is in the low 40s.  We will discontinue her atenolol 25 mg and uptitrate her other blood pressure medications.  Previously, her elevated blood pressure probably helped with brain perfusion during bradycardia.  We expect her heart rate to improve over the next 48 hours.  2. Questionable syncope: It is unclear if she truly passed out.  She said she slipped down the chair today.  When I asked her if she feels like she passed out, she told me that other people told her she passed out.  We will place her on cardiac telemetry and observe overnight  3. Hypertensive urgency: While in the ED, she was severely hypertensive with systolic blood pressure over 200.  Will uptitrate her amlodipine while discontinuing her beta-blocker  4. Hyperlipidemia: On pravastatin  5. Hypothyroidism: Obtain TSH  For questions or updates, please contact [facility] Please consult [email or web address] for contact info under   Signed, Azalee Course, [place]  04/22/2019 7:40 PM  The patient was seen, examined and discussed with Azalee Course, PA-C and I agree with the above.   A very pleasant and functional 86 year old female who presented after an episode of syncope. She has no cardiac history and had an echo in 2015 with normal LVEF, minimal valvular abnormalities.  She was bradycardic at the time in 89', now on presentation in low 75' with 1.AVB, hypertensive up to 217 mmHg.  On physical exam no JVDs, S1,2, no significant murmur, lungs CTA B/L, no LE edema, good pulses. The patient is AAO x3.  Currently asymptomatic.  We will increase amlodipine to 5 mg po daily, discontinue atenolol, observe ECGs overnight, increase amlodipine as needed in the am. If no significant bradycardia or pauses and BP < 160 we will plan for a discharge.  Tobias Alexander, MD 04/22/2019

## 2019-04-23 ENCOUNTER — Other Ambulatory Visit: Payer: Self-pay

## 2019-04-23 DIAGNOSIS — R001 Bradycardia, unspecified: Secondary | ICD-10-CM | POA: Diagnosis not present

## 2019-04-23 DIAGNOSIS — I160 Hypertensive urgency: Secondary | ICD-10-CM | POA: Diagnosis not present

## 2019-04-23 DIAGNOSIS — E039 Hypothyroidism, unspecified: Secondary | ICD-10-CM

## 2019-04-23 LAB — BASIC METABOLIC PANEL
Anion gap: 10 (ref 5–15)
BUN: 20 mg/dL (ref 8–23)
CO2: 27 mmol/L (ref 22–32)
Calcium: 9.8 mg/dL (ref 8.9–10.3)
Chloride: 104 mmol/L (ref 98–111)
Creatinine, Ser: 0.88 mg/dL (ref 0.44–1.00)
GFR calc Af Amer: >60 mL/min (ref >60)
GFR calc non Af Amer: 57 mL/min — ABNORMAL LOW (ref >60)
Glucose, Bld: 97 mg/dL (ref 70–99)
Potassium: 4.2 mmol/L (ref 3.5–5.1)
Sodium: 141 mmol/L (ref 135–145)

## 2019-04-23 LAB — CREATININE, SERUM
Creatinine, Ser: 1.03 mg/dL — ABNORMAL HIGH (ref 0.44–1.00)
GFR calc Af Amer: 55 mL/min — ABNORMAL LOW (ref >60)
GFR calc non Af Amer: 47 mL/min — ABNORMAL LOW (ref >60)

## 2019-04-23 LAB — CBC
HCT: 36.7 % (ref 36.0–46.0)
Hemoglobin: 11.9 g/dL — ABNORMAL LOW (ref 12.0–15.0)
MCH: 28.9 pg (ref 26.0–34.0)
MCHC: 32.4 g/dL (ref 30.0–36.0)
MCV: 89.1 fL (ref 80.0–100.0)
Platelets: 176 10*3/uL (ref 150–400)
RBC: 4.12 MIL/uL (ref 3.87–5.11)
RDW: 12.6 % (ref 11.5–15.5)
WBC: 6.3 10*3/uL (ref 4.0–10.5)
nRBC: 0 % (ref 0.0–0.2)

## 2019-04-23 LAB — SARS CORONAVIRUS 2 (TAT 6-24 HRS): SARS Coronavirus 2: NEGATIVE

## 2019-04-23 LAB — TROPONIN I (HIGH SENSITIVITY): Troponin I (High Sensitivity): 10 ng/L (ref <18)

## 2019-04-23 LAB — T4, FREE: Free T4: 1.56 ng/dL — ABNORMAL HIGH (ref 0.61–1.12)

## 2019-04-23 LAB — TSH: TSH: 3.068 u[IU]/mL (ref 0.350–4.500)

## 2019-04-23 MED ORDER — AMLODIPINE BESYLATE 10 MG PO TABS: 5.0000 mg | ORAL_TABLET | Freq: Every day | ORAL | 0 refills | Status: DC

## 2019-04-23 NOTE — Discharge Summary (Signed)
Discharge Summary    Patient ID: Tracey Rosario,  MRN: [id number], DOB/AGE: 84-Sep-1928 84 y.o.  Admit date: 04/22/2019 Discharge date: 04/23/2019  Primary Care Provider: Courtney Paris Primary Cardiologist: Tobias Alexander, MD  Discharge Diagnoses    Principal Problem:   Bradycardia Active Problems:   Hyperlipidemia   Essential hypertension   Allergies Allergies  Allergen Reactions  . Codeine Other (See Comments)    Makes her crazy    Diagnostic Studies/Procedures    N/a  _____________   History of Present Illness     Tracey Rosario is a pleasant 86 year old female with past medical history of hypertension, hypothyroidism, hyperlipidemia, and GERD.  Patient moved from [place] to [place] in 2015 to be closer to her daughter who is disabled.  Her other daughter is currently in [place].  She has been on atenolol for many years for suppressing PVCs.  She was evaluated by Dr. Rennis Golden in July 2015 due to frequent PVCs in trigeminy pattern.  Given lack of chest discomfort, Dr. Herbie Baltimore recommended simple echocardiogram as initial evaluation.  Echocardiogram obtained on 08/16/2013 showed EF 60 to 65%, mild AI, mild LAE, mild to moderate TR.  She has not seen cardiology service since.  According to the patient, she had been using her walker in the past several days for balance.  Prior to that, she said she had an episode where she pulled her muscle bending over to pick up something off the ground at a supermarket.  She experienced a sharp pain under her right rib cage.  Since then, she had been using her walker.  She denied any exertional chest pain.  She was currently living with her daughter who is disabled.  The day of admission, after walking into her room, she felt extra tried.  She tried to sit down in the chair however lost her balance and slipped out of the chair.  There was a question of possible syncope.  On arrival to the ED, her blood pressure was  elevated in the 190-200 range.  She denied any chest pain or shortness of breath.  She denied any dizziness, blurred vision or feeling of passing out at this point.  Telemetry showed she was in sinus bradycardia with heart rate in the low 40s. Cardiology was consulted for bradycardia.  Hospital Course     Consultants: None  1. Bradycardia: She was previously seen by Dr. Rennis Golden in 2015 who noted her heart rate was in the low 50s at the time. On admission her heart rate is in the low 40s. We discontinued her atenolol 25 mg and uptitrate her other blood pressure medications. Her HR did improve but suspected her BB was still washing out. Will plan to see back in the office in one week and decide then if further monitoring with Zio patch is needed.   2. Hypertensive urgency: While in the ED, she was severely hypertensive with systolic blood pressure over 200.  Her amlodipine was titrated to 10mg  daily with lisinopril continued with improvement.   3. Hyperlipidemia: On pravastatin  4. Hypothyroidism: normal TSH   was seen by Dr. [name] and determined stable for discharge home. Follow up in the office has been arranged. Medications are listed below.   _____________  Discharge Vitals Blood pressure (!) 169/47, pulse (!) 55, temperature 98.4 F (36.9 C), temperature source Oral, resp. rate 18, SpO2 96 %.  There were no vitals filed for this visit.  Labs & Radiologic Studies  CBC Recent Labs    04/22/19 1631 04/23/19 0055  WBC 5.6 6.3  NEUTROABS 4.1  --   HGB 13.4 11.9*  HCT 41.7 36.7  MCV 90.8 89.1  PLT 218 989   Basic Metabolic Panel Recent Labs    04/22/19 1631 04/22/19 1631 04/23/19 0055 04/23/19 0526  NA 140  --   --  141  K 4.2  --   --  4.2  CL 102  --   --  104  CO2 26  --   --  27  GLUCOSE 108*  --   --  97  BUN 23  --   --  20  CREATININE 1.26*   < > 1.03* 0.88  CALCIUM 9.8  --   --  9.8  MG 2.1  --   --   --    < > = values in this interval  not displayed.   Liver Function Tests Recent Labs    04/22/19 1631  AST 25  ALT 22  ALKPHOS 41  BILITOT 0.8  PROT 6.9  ALBUMIN 4.3   No results for input(s): LIPASE, AMYLASE in the last 72 hours. Cardiac Enzymes No results for input(s): CKTOTAL, CKMB, CKMBINDEX, TROPONINI in the last 72 hours. BNP Invalid input(s): POCBNP D-Dimer No results for input(s): DDIMER in the last 72 hours. Hemoglobin A1C No results for input(s): HGBA1C in the last 72 hours. Fasting Lipid Panel No results for input(s): CHOL, HDL, LDLCALC, TRIG, CHOLHDL, LDLDIRECT in the last 72 hours. Thyroid Function Tests Recent Labs    04/23/19 0055  TSH 3.068   _____________  DG Chest 2 View  Result Date: 04/22/2019 CLINICAL DATA:  Syncope EXAM: CHEST - 2 VIEW COMPARISON:  10/07/2013 FINDINGS: Biapical pleural-parenchymal scarring. No focal consolidation. No pleural effusion or pneumothorax. The heart is normal in size. Visualized osseous structures are within normal limits. IMPRESSION: Normal chest radiographs. Electronically Signed   By: Julian Hy M.D.   On: 04/22/2019 17:38   CT Head Wo Contrast  Result Date: 04/22/2019 CLINICAL DATA:  Head trauma, fall EXAM: CT HEAD WITHOUT CONTRAST TECHNIQUE: Contiguous axial images were obtained from the base of the skull through the vertex without intravenous contrast. COMPARISON:  05/04/2005 FINDINGS: Brain: No evidence of acute infarction, hemorrhage, hydrocephalus, extra-axial collection or mass lesion/mass effect. Periventricular and deep white matter hypodensity. Vascular: No hyperdense vessel or unexpected calcification. Skull: Normal. Negative for fracture or focal lesion. Sinuses/Orbits: No acute finding. Other: Partially calcified, benign cutaneous lesion of the left occiput, unchanged compared to prior exam. IMPRESSION: No acute intracranial pathology.  Small-vessel white matter disease. Electronically Signed   By: Eddie Candle M.D.   On: 04/22/2019 19:08    XR HIP UNILAT W OR W/O PELVIS 2-3 VIEWS RIGHT  Result Date: 04/03/2019 X-rays demonstrate stable the pending with no new fracture  XR Lumbar Spine 2-3 Views  Result Date: 04/03/2019 X-rays demonstrate significant disc space narrowing L5-S1  Disposition   Pt is being discharged home today in good condition.  Follow-up Plans & Appointments    Follow-up Information    Charlie Pitter, PA-C Follow up on 05/02/2019.   Specialties: Cardiology, Radiology Why: at 8:15am for your follow up appt Contact information: [address] [place] [address] [phone]          Discharge Instructions    Call MD for:  persistant dizziness or light-headedness   Complete by: As directed    Diet - low sodium heart healthy  Complete by: As directed    Increase activity slowly   Complete by: As directed        Discharge Medications     Medication List    STOP taking these medications   citalopram 20 MG tablet Commonly known as: CELEXA     TAKE these medications   ALPRAZolam 0.25 MG tablet Commonly known as: XANAX Take 0.25 mg by mouth daily.   amLODipine 10 MG tablet Commonly known as: NORVASC Take 0.5 tablets (5 mg total) by mouth daily. Start taking on: April 24, 2019 What changed:   medication strength  See the new instructions.   Artificial Tears 1.4 % ophthalmic solution Generic drug: polyvinyl alcohol Place 1 drop into both eyes as needed for dry eyes.   aspirin EC 81 MG tablet Take 162 mg by mouth at bedtime.   donepezil 10 MG tablet Commonly known as: ARICEPT Take 10 mg by mouth daily.   Euthyrox 50 MCG tablet Generic drug: levothyroxine Take 50 mcg by mouth daily before breakfast.   fluticasone 50 MCG/ACT nasal spray Commonly known as: FLONASE USE 1 SPRAY IN EACH NOSTRIL DAILY (NEED APPOINTMENT) What changed: See the new instructions.   lisinopril 30 MG tablet Commonly known as: ZESTRIL TAKE 1 TABLET DAILY (NEED AN  APPOINTMENT) What changed: See the new instructions.   meloxicam 7.5 MG tablet Commonly known as: MOBIC Take 7.5 mg by mouth daily.   methocarbamol 500 MG tablet Commonly known as: Robaxin Take 1 tablet (500 mg total) by mouth 2 (two) times daily as needed. What changed:   when to take this  reasons to take this   omeprazole 20 MG capsule Commonly known as: PRILOSEC TAKE 1 CAPSULE DAILY   pravastatin 20 MG tablet Commonly known as: PRAVACHOL TAKE 1 TABLET DAILY (NEED AN APPOINTMENT)   PROBIOTIC PO Take 1 tablet by mouth daily.   Vitamin D3 125 MCG (5000 UT) Tabs Take 5,000 Units by mouth daily.       No                               Did the patient have a percutaneous coronary intervention (stent / angioplasty)?:  No.      Outstanding Labs/Studies   N/a   Duration of Discharge Encounter   Greater than 30 minutes including physician time.  Signed, Laverda Page NP-C 04/23/2019, 11:30 AM

## 2019-04-23 NOTE — ED Notes (Signed)
Pt discharge instructions reviewed with the patient. The patient verbalized understanding. Pt discharged. 

## 2019-04-23 NOTE — ED Notes (Signed)
Lunch Tray Ordered @ 1032. 

## 2019-04-23 NOTE — ED Notes (Signed)
Pt assisted with sitting up in bed to eat breakfast 

## 2019-04-23 NOTE — Progress Notes (Signed)
Progress Note  Patient Name: Tracey Rosario Date of Encounter: 04/23/2019  Primary Cardiologist: New/Dr Delton See  Subjective   She feels better today, no dizziness.  Inpatient Medications    Scheduled Meds: . ALPRAZolam  0.25 mg Oral Daily  . amLODipine  5 mg Oral Daily  . donepezil  10 mg Oral Daily  . heparin  5,000 Units Subcutaneous Q8H  . levothyroxine  50 mcg Oral QAC breakfast  . lisinopril  30 mg Oral Daily  . pravastatin  20 mg Oral q1800   Continuous Infusions:  PRN Meds: acetaminophen, ondansetron (ZOFRAN) IV   Vital Signs    Vitals:   04/23/19 0900 04/23/19 1000 04/23/19 1014 04/23/19 1030  BP: (!) 157/56 (!) 147/47 (!) 147/46 (!) 153/43  Pulse: (!) 123 (!) 54  (!) 50  Resp: 12 14  16   Temp:      TempSrc:      SpO2: (!) 69% 97%  94%   No intake or output data in the 24 hours ending 04/23/19 1035 Last 3 Weights 11/12/2018 03/09/2017 02/22/2017  Weight (lbs) 167 lb 164 lb 4.8 oz 162 lb 4 oz  Weight (kg) 75.751 kg 74.526 kg 73.596 kg      Telemetry    SB 46-61/min - Personally Reviewed  ECG    No new tracing - Personally Reviewed  Physical Exam   GEN: No acute distress.   Neck: No JVD Cardiac: RRR, no murmurs, rubs, or gallops.  Respiratory: Clear to auscultation bilaterally. GI: Soft, nontender, non-distended  MS: No edema; No deformity. Neuro:  Nonfocal  Psych: Normal affect   Labs    High Sensitivity Troponin:   Recent Labs  Lab 04/22/19 1631 04/22/19 2045 04/23/19 0055  TROPONINIHS 9 8 10       Chemistry Recent Labs  Lab 04/22/19 1631 04/23/19 0055 04/23/19 0526  NA 140  --  141  K 4.2  --  4.2  CL 102  --  104  CO2 26  --  27  GLUCOSE 108*  --  97  BUN 23  --  20  CREATININE 1.26* 1.03* 0.88  CALCIUM 9.8  --  9.8  PROT 6.9  --   --   ALBUMIN 4.3  --   --   AST 25  --   --   ALT 22  --   --   ALKPHOS 41  --   --   BILITOT 0.8  --   --   GFRNONAA 37* 47* 57*  GFRAA 43* 55* >60  ANIONGAP 12  --  10      Hematology Recent Labs  Lab 04/22/19 1631 04/23/19 0055  WBC 5.6 6.3  RBC 4.59 4.12  HGB 13.4 11.9*  HCT 41.7 36.7  MCV 90.8 89.1  MCH 29.2 28.9  MCHC 32.1 32.4  RDW 12.6 12.6  PLT 218 176    BNP Recent Labs  Lab 04/22/19 1631  BNP 262.7*     DDimer No results for input(s): DDIMER in the last 168 hours.   Radiology    DG Chest 2 View  Result Date: 04/22/2019 CLINICAL DATA:  Syncope EXAM: CHEST - 2 VIEW COMPARISON:  10/07/2013 FINDINGS: Biapical pleural-parenchymal scarring. No focal consolidation. No pleural effusion or pneumothorax. The heart is normal in size. Visualized osseous structures are within normal limits. IMPRESSION: Normal chest radiographs. Electronically Signed   By: 04/24/2019 M.D.   On: 04/22/2019 17:38   CT Head Wo Contrast  Result Date: 04/22/2019 CLINICAL  DATA:  Head trauma, fall EXAM: CT HEAD WITHOUT CONTRAST TECHNIQUE: Contiguous axial images were obtained from the base of the skull through the vertex without intravenous contrast. COMPARISON:  05/04/2005 FINDINGS: Brain: No evidence of acute infarction, hemorrhage, hydrocephalus, extra-axial collection or mass lesion/mass effect. Periventricular and deep white matter hypodensity. Vascular: No hyperdense vessel or unexpected calcification. Skull: Normal. Negative for fracture or focal lesion. Sinuses/Orbits: No acute finding. Other: Partially calcified, benign cutaneous lesion of the left occiput, unchanged compared to prior exam. IMPRESSION: No acute intracranial pathology.  Small-vessel white matter disease. Electronically Signed   By: Eddie Candle M.D.   On: 04/22/2019 19:08   Cardiac Studies     Patient Profile     86 y.o. female admitted for observation after an episode of syncope  Assessment & Plan    1. Symptomatic bradycardia  - improved, atenolol was discontinued but might be still washing out - we will arrange for a follow up in the clinic within the next 7 days and decide then  if she needs Zio patch monitor  2. Hypertensive urgency - amlodipine was increased to  Mg po daily, I will further increase to 10 mg po daily, continue lisinopril 20 mg po daily  3. HLP - on pravastatin  4. Hypothyroidism - normal TSH  She can be discharged home, we will arrange for an outpatient follow up.  For questions or updates, please contact [place] Please consult [email or web address] for contact info under     Signed, Ena Dawley, MD  04/23/2019, 10:35 AM

## 2019-04-24 ENCOUNTER — Encounter (HOSPITAL_COMMUNITY): Payer: Self-pay

## 2019-04-24 ENCOUNTER — Other Ambulatory Visit: Payer: Self-pay | Admitting: Student

## 2019-04-24 ENCOUNTER — Emergency Department (HOSPITAL_COMMUNITY)
Admission: EM | Admit: 2019-04-24 | Discharge: 2019-04-24 | Disposition: A | Discharge status: Home / Self Care | Payer: Medicare Other | Attending: Emergency Medicine | Admitting: Emergency Medicine

## 2019-04-24 ENCOUNTER — Emergency Department (HOSPITAL_COMMUNITY): Payer: Medicare Other

## 2019-04-24 ENCOUNTER — Other Ambulatory Visit: Payer: Self-pay

## 2019-04-24 DIAGNOSIS — R531 Weakness: Secondary | ICD-10-CM | POA: Diagnosis not present

## 2019-04-24 DIAGNOSIS — R55 Syncope and collapse: Secondary | ICD-10-CM | POA: Insufficient documentation

## 2019-04-24 DIAGNOSIS — I10 Essential (primary) hypertension: Secondary | ICD-10-CM | POA: Diagnosis not present

## 2019-04-24 DIAGNOSIS — Z79899 Other long term (current) drug therapy: Secondary | ICD-10-CM | POA: Insufficient documentation

## 2019-04-24 DIAGNOSIS — R001 Bradycardia, unspecified: Secondary | ICD-10-CM

## 2019-04-24 DIAGNOSIS — M7918 Myalgia, other site: Secondary | ICD-10-CM | POA: Insufficient documentation

## 2019-04-24 DIAGNOSIS — K219 Gastro-esophageal reflux disease without esophagitis: Secondary | ICD-10-CM | POA: Insufficient documentation

## 2019-04-24 LAB — URINALYSIS, ROUTINE W REFLEX MICROSCOPIC
Bilirubin Urine: NEGATIVE
Glucose, UA: NEGATIVE mg/dL
Hgb urine dipstick: NEGATIVE
Ketones, ur: NEGATIVE mg/dL
Leukocytes,Ua: NEGATIVE
Nitrite: NEGATIVE
Protein, ur: 30 mg/dL — AB
Specific Gravity, Urine: 1.021 (ref 1.005–1.030)
pH: 5 (ref 5.0–8.0)

## 2019-04-24 LAB — COMPREHENSIVE METABOLIC PANEL
ALT: 22 U/L (ref 0–44)
AST: 28 U/L (ref 15–41)
Albumin: 4.1 g/dL (ref 3.5–5.0)
Alkaline Phosphatase: 40 U/L (ref 38–126)
Anion gap: 15 (ref 5–15)
BUN: 21 mg/dL (ref 8–23)
CO2: 22 mmol/L (ref 22–32)
Calcium: 9.8 mg/dL (ref 8.9–10.3)
Chloride: 102 mmol/L (ref 98–111)
Creatinine, Ser: 1.08 mg/dL — ABNORMAL HIGH (ref 0.44–1.00)
GFR calc Af Amer: 52 mL/min — ABNORMAL LOW (ref >60)
GFR calc non Af Amer: 45 mL/min — ABNORMAL LOW (ref >60)
Glucose, Bld: 152 mg/dL — ABNORMAL HIGH (ref 70–99)
Potassium: 4.2 mmol/L (ref 3.5–5.1)
Sodium: 139 mmol/L (ref 135–145)
Total Bilirubin: 0.9 mg/dL (ref 0.3–1.2)
Total Protein: 6.9 g/dL (ref 6.5–8.1)

## 2019-04-24 LAB — CBC WITH DIFFERENTIAL/PLATELET
Abs Immature Granulocytes: 0.01 10*3/uL (ref 0.00–0.07)
Basophils Absolute: 0 10*3/uL (ref 0.0–0.1)
Basophils Relative: 1 %
Eosinophils Absolute: 0.1 10*3/uL (ref 0.0–0.5)
Eosinophils Relative: 2 %
HCT: 40.4 % (ref 36.0–46.0)
Hemoglobin: 13.3 g/dL (ref 12.0–15.0)
Immature Granulocytes: 0 %
Lymphocytes Relative: 22 %
Lymphs Abs: 1 10*3/uL (ref 0.7–4.0)
MCH: 29.4 pg (ref 26.0–34.0)
MCHC: 32.9 g/dL (ref 30.0–36.0)
MCV: 89.4 fL (ref 80.0–100.0)
Monocytes Absolute: 0.3 10*3/uL (ref 0.1–1.0)
Monocytes Relative: 7 %
Neutro Abs: 3.1 10*3/uL (ref 1.7–7.7)
Neutrophils Relative %: 68 %
Platelets: 200 10*3/uL (ref 150–400)
RBC: 4.52 MIL/uL (ref 3.87–5.11)
RDW: 12.7 % (ref 11.5–15.5)
WBC: 4.5 10*3/uL (ref 4.0–10.5)
nRBC: 0 % (ref 0.0–0.2)

## 2019-04-24 LAB — TROPONIN I (HIGH SENSITIVITY)
Troponin I (High Sensitivity): 12 ng/L (ref <18)
Troponin I (High Sensitivity): 21 ng/L — ABNORMAL HIGH (ref <18)

## 2019-04-24 LAB — CBG MONITORING, ED: Glucose-Capillary: 135 mg/dL — ABNORMAL HIGH (ref 70–99)

## 2019-04-24 MED ORDER — SODIUM CHLORIDE 0.9 % IV BOLUS
500.0000 mL | Freq: Once | INTRAVENOUS | Status: AC
  Administered 2019-04-24: 500 mL via INTRAVENOUS

## 2019-04-24 MED ORDER — MORPHINE SULFATE (PF) 2 MG/ML IV SOLN
2.0000 mg | Freq: Once | INTRAVENOUS | Status: AC
  Administered 2019-04-24: 2 mg via INTRAVENOUS
  Filled 2019-04-24: qty 1

## 2019-04-24 MED ORDER — ACETAMINOPHEN 325 MG PO TABS
650.0000 mg | ORAL_TABLET | Freq: Once | ORAL | Status: AC
  Administered 2019-04-24: 650 mg via ORAL
  Filled 2019-04-24: qty 2

## 2019-04-24 MED ORDER — ONDANSETRON 4 MG PO TBDP
4.0000 mg | ORAL_TABLET | Freq: Once | ORAL | Status: AC
  Administered 2019-04-24: 4 mg via ORAL
  Filled 2019-04-24: qty 1

## 2019-04-24 NOTE — ED Notes (Signed)
Pt transported to CT ?

## 2019-04-24 NOTE — ED Triage Notes (Signed)
From home via ems; called out for syncope; pt on phone with granddaughter making breakfast, said she didn't feel well and stopped talking; daughter granddaughter drove over (5-6 min later); found her sitting in kitchen up against cabinet; unresponsive initially; family laid pt down, then pt began talking to them; on ems arrival, pt had cyanosis around lips and finger tips; pt cold, not answer questions well; seen here 2 days ago for syncope related bradycardia; lowest HR with ems 56; once warmed up, pt began answering questions more; initially denies pain, then c/o generalized pain; pt shivering on arrival  160/86 100% RA 146 cbg HR 60 RR 24

## 2019-04-24 NOTE — ED Notes (Signed)
Patient provided [place] sandwich, apple sauce, and water.

## 2019-04-24 NOTE — Progress Notes (Signed)
Ordered 2 week live monitor for further evaluation of syncope at the request of Dr. Katrinka Blazing.

## 2019-04-24 NOTE — ED Provider Notes (Signed)
  Face-to-face evaluation   History: Patient here for evaluation of syncope.  This occurred this morning as she was at home making breakfast.  She has not yet eaten anything today.  She is recovering from hospitalization, left yesterday, after syncope and bradycardia.  Atenolol was stopped.  Physical exam: Awake, alert and cooperative.  She is lucid.  No dysarthria or aphasia.  Normal strength arms and legs bilaterally.  No peripheral edema.  Medical screening examination/treatment/procedure(s) were conducted as a shared visit with non-physician practitioner(s) and myself.  I personally evaluated the patient during the encounter    Mancel Bale, MD 04/24/19 2344

## 2019-04-24 NOTE — ED Notes (Signed)
Patient verbalizes understanding of discharge instructions. Opportunity for questioning and answers were provided. Armband removed by staff, pt discharged from ED.  

## 2019-04-24 NOTE — Discharge Instructions (Signed)
If you have any additional passing out episodes you need to return to the emergency department  Cardiology office has placed orders to have a Holter monitor which will arrive at your house.  They will call you with instructions.  Follow up with cardiology as prescribed

## 2019-04-24 NOTE — ED Provider Notes (Signed)
[facility] EMERGENCY DEPARTMENT Provider Note   CSN: [id number] Arrival date & time: 04/24/19  1219    History Chief Complaint  Patient presents with  . Loss of Consciousness    Tracey Rosario is a 86 y.o. female with past medical history significant for palpitations, short-term memory loss, depression, dizziness, hypertension who presents for evaluation after syncope.  Patient seen here in the hospital 2 days ago for similar complaints.  She was admitted for observation under the cardiology service.  They thought this was likely due to her atenolol.  Had heart rate in the 40s at that time.  They discontinued this medication.  They also started her on amlodipine.  Patient states she was not feeling well when she was on the phone with her daughter.  Apparently daughter who is present in room states she went to go and see her due to patient saying she did not feel good and had stopped talking when she found her sitting in the kitchen on a chair leaning up against a cabinet unresponsive.  Family states patient was diaphoretic, had circumoral cyanosis and cyanosis to her fingertips.  Initially could not answer questions with family.  They laid her on the ground at that time.   Currently patient admits to some generalized pain.  She is shivering on exam.    Daughter states patient lives alone.  Daughter states they did look at her medications today and patient did not take the atenolol last night.  Heart rate with EMS was 56.  History obtained from patient, daughter and past medical records.  No interpreter is used.  HPI    Past Medical History:  Diagnosis Date  . Acid reflux   . Anemia   . Arthropathy   . Cognitive decline    referred to neurologist  . Depression 10/07/2013   daughter didn't know anything about this  . Dizziness   . GERD (gastroesophageal reflux disease) 05/04/2013  . Hip fx (HCC)    r hip, bilat pubis rami  . Hyperlipemia   . Hypertension   .  Hypothyroidism   . Insomnia   . Multilevel degenerative disc disease   . Palpitations 05/04/2013   saw cardiologist in 2015, he deleted A. Fib from the record as per note not documentation to support  . Short-term memory loss   . Vitamin D deficiency     Patient Active Problem List   Diagnosis Date Noted  . Bradycardia 04/22/2019  . Age-related memory disorder 11/12/2018  . Other osteoporosis without current pathological fracture 01/28/2016  . Mild cognitive impairment 10/15/2015  . Esophageal reflux 06/11/2015  . Small vessel disease (HCC) 04/30/2015  . Essential hypertension 04/07/2015  . Tremor 04/07/2015  . Major depressive disorder with single episode, in full remission (HCC) 03/30/2015  . Absolute anemia 03/30/2015  . Asymptomatic PVCs 08/01/2013  . Hyperlipidemia 06/04/2013    Past Surgical History:  Procedure Laterality Date  . ABDOMINAL HYSTERECTOMY  1971  . BLADDER SURGERY  2010  . Broken Pelvis  2015  . C-EYE SURGERY PROCEDURE Bilateral    [facility]  . CATARACT EXTRACTION Bilateral    Done in [place]  . HIP PINNING,CANNULATED Right 10/07/2013   Procedure: CANNULATED screws right hip;  Surgeon: Kathryne Hitch, MD;  Location: [facility] OR;  Service: Orthopedics;  Laterality: Right;     OB History   No obstetric history on file.     Family History  Problem Relation Age of Onset  . Prostate cancer Father   .  Heart attack Sister   . Cancer Brother   . Cancer Sister   . Heart attack Sister   . Cancer Sister   . Stroke Daughter   . Dementia Neg Hx   . Alzheimer's disease Neg Hx     Social History   Tobacco Use  . Smoking status: Former Smoker    Types: Cigarettes    Quit date: 08/02/1970    Years since quitting: 48.7  . Smokeless tobacco: Never Used  . Tobacco comment: "smoked very little"  Substance Use Topics  . Alcohol use: Never    Alcohol/week: 0.0 standard drinks  . Drug use: Never    Home Medications Prior to Admission medications     Medication Sig Start Date End Date Taking? Authorizing Provider  ALPRAZolam Prudy Feeler(XANAX) 0.25 MG tablet Take 0.25 mg by mouth daily as needed for anxiety.  09/27/18  Yes [provider]  amLODipine (NORVASC) 2.5 MG tablet Take 2.5 mg by mouth daily.   Yes [provider]  aspirin EC 81 MG tablet Take 162 mg by mouth at bedtime.   Yes [provider]  Cholecalciferol (VITAMIN D3) 125 MCG (5000 UT) TABS Take 5,000 Units by mouth daily.    Yes [provider]  donepezil (ARICEPT) 10 MG tablet Take 10 mg by mouth daily. 09/27/18  Yes [provider]  levothyroxine (EUTHYROX) 50 MCG tablet Take 50 mcg by mouth daily before breakfast.   Yes [provider]  lisinopril (PRINIVIL,ZESTRIL) 30 MG tablet TAKE 1 TABLET DAILY (NEED AN APPOINTMENT) Patient taking differently: Take 30 mg by mouth daily.  03/09/18  Yes Terressa KoyanagiKim, Hannah R, DO  meloxicam (MOBIC) 7.5 MG tablet Take 7.5 mg by mouth daily.  10/06/18  Yes [provider]  methocarbamol (ROBAXIN) 500 MG tablet Take 1 tablet (500 mg total) by mouth 2 (two) times daily as needed. Patient taking differently: Take 500 mg by mouth at bedtime as needed for muscle spasms.  04/03/19  Yes Cristie HemStanbery, Mary L, PA-C  omeprazole (PRILOSEC) 20 MG capsule TAKE 1 CAPSULE DAILY Patient taking differently: Take 20 mg by mouth daily.  10/20/17  Yes Kriste BasqueKim, Hannah R, DO  polyvinyl alcohol (ARTIFICIAL TEARS) 1.4 % ophthalmic solution Place 1 drop into both eyes as needed for dry eyes.   Yes [provider]  pravastatin (PRAVACHOL) 20 MG tablet TAKE 1 TABLET DAILY (NEED AN APPOINTMENT) Patient taking differently: Take 20 mg by mouth daily.  03/09/18  Yes Terressa KoyanagiKim, Hannah R, DO  Probiotic Product (PROBIOTIC PO) Take 1 tablet by mouth daily.    Yes [provider]  amLODipine (NORVASC) 10 MG tablet Take 0.5 tablets (5 mg total) by mouth daily. Patient not taking: Reported on 04/24/2019 04/24/19   Arty Baumgartneroberts, Lindsay B, NP   fluticasone [facility](FLONASE) 50 MCG/ACT nasal spray USE 1 SPRAY IN EACH NOSTRIL DAILY (NEED APPOINTMENT) Patient not taking: No sig reported 02/12/18   Terressa KoyanagiKim, Hannah R, DO    Allergies    Codeine  Review of Systems   Review of Systems  Constitutional: Negative.   HENT: Negative.   Respiratory: Negative.   Cardiovascular: Negative.   Gastrointestinal: Negative.   Genitourinary: Negative.   Musculoskeletal: Negative.   Skin: Negative.   Neurological: Positive for syncope and weakness (Generalized). Negative for dizziness, tremors, seizures, facial asymmetry, speech difficulty, light-headedness, numbness and headaches.  All other systems reviewed and are negative.  Physical Exam Updated Vital Signs BP 135/66   Pulse 64   Temp 98.2 F (36.8 C) (  Oral)   Resp 16   Ht 5\' 10"  (1.778 m)   Wt 65.8 kg   SpO2 95%   BMI 20.81 kg/m   Physical Exam Vitals and nursing note reviewed.  Constitutional:      General: She is not in acute distress.    Appearance: She is well-developed. She is not ill-appearing, toxic-appearing or diaphoretic.  HENT:     Head: Normocephalic and atraumatic.     Nose: Nose normal.     Mouth/Throat:     Mouth: Mucous membranes are moist.     Pharynx: Oropharynx is clear.  Eyes:     Pupils: Pupils are equal, round, and reactive to light.  Cardiovascular:     Rate and Rhythm: Normal rate.     Pulses: Normal pulses.          Radial pulses are 2+ on the right side and 2+ on the left side.       Dorsalis pedis pulses are 2+ on the right side and 2+ on the left side.       Posterior tibial pulses are 2+ on the right side and 2+ on the left side.     Heart sounds: Normal heart sounds.     Comments: HR 60-70 on exam Pulmonary:     Effort: Pulmonary effort is normal. No respiratory distress.     Breath sounds: Normal breath sounds.     Comments: Speaks in full sentences without difficulty Abdominal:     General: Bowel sounds are normal. There is no distension.      Tenderness: There is no abdominal tenderness. There is no right CVA tenderness, left CVA tenderness, guarding or rebound.  Musculoskeletal:        General: Normal range of motion.     Cervical back: Normal range of motion.     Comments: Moves all 4 extremities without difficulty.  Compartments soft.  No bony tenderness.  Skin:    General: Skin is warm and dry.     Capillary Refill: Capillary refill takes less than 2 seconds.     Comments: No edema, erythema or warmth, contusions or abrasions.  No laceration  Neurological:     Mental Status: She is alert. Mental status is at baseline.     Cranial Nerves: Cranial nerves are intact.     Sensory: Sensation is intact.     Motor: Motor function is intact.     Coordination: Coordination is intact.     Gait: Gait is intact.     Comments: Cranial nerves II through XII grossly intact.  Negative finger-nose, heel-to-shin,    ED Results / Procedures / Treatments   Labs (all labs ordered are listed, but only abnormal results are displayed) Labs Reviewed  COMPREHENSIVE METABOLIC PANEL - Abnormal; Notable for the following components:      Result Value   Glucose, Bld 152 (*)    Creatinine, Ser 1.08 (*)    GFR calc non Af Amer 45 (*)    GFR calc Af Amer 52 (*)    All other components within normal limits  URINALYSIS, ROUTINE W REFLEX MICROSCOPIC - Abnormal; Notable for the following components:   APPearance HAZY (*)    Protein, ur 30 (*)    Bacteria, UA RARE (*)    All other components within normal limits  CBG MONITORING, ED - Abnormal; Notable for the following components:   Glucose-Capillary 135 (*)    All other components within normal limits  TROPONIN I (HIGH SENSITIVITY) -  Abnormal; Notable for the following components:   Troponin I (High Sensitivity) 21 (*)    All other components within normal limits  CBC WITH DIFFERENTIAL/PLATELET  TROPONIN I (HIGH SENSITIVITY)    EKG EKG Interpretation  Date/Time:  Wednesday April 24 2019  12:29:54 EDT Ventricular Rate:  58 PR Interval:    QRS Duration: 91 QT Interval:  430 QTC Calculation: 423 R Axis:   55 Text Interpretation: Sinus rhythm RSR' in V1 or V2, probably normal variant Confirmed by Raeford Razor [phone]) on 04/24/2019 12:46:37 PM   Radiology DG Chest 2 View  Result Date: 04/24/2019 CLINICAL DATA:  Syncope. EXAM: CHEST - 2 VIEW COMPARISON:  Prior chest radiographs 04/22/2019 and earlier FINDINGS: Heart size within normal limits. Aortic atherosclerosis. Redemonstrated biapical pleuroparenchymal scarring. No evidence of airspace consolidation within the lungs. No evidence of pleural effusion or pneumothorax. No acute bony abnormality is identified. IMPRESSION: No evidence of acute cardiopulmonary abnormality. Aortic atherosclerosis. Electronically Signed   By: Jackey Loge DO   On: 04/24/2019 14:00   CT Head Wo Contrast  Result Date: 04/24/2019 CLINICAL DATA:  Headache, syncope EXAM: CT HEAD WITHOUT CONTRAST TECHNIQUE: Contiguous axial images were obtained from the base of the skull through the vertex without intravenous contrast. COMPARISON:  04/22/2019 FINDINGS: Brain: Stable chronic small-vessel ischemic changes throughout the periventricular white matter. No signs of acute infarct or hemorrhage. Lateral ventricles and midline structures are unremarkable. No acute extra-axial fluid collections. No mass effect. Vascular: No hyperdense vessel or unexpected calcification. Skull: Normal. Negative for fracture or focal lesion. Sinuses/Orbits: No acute finding. Other: Stable calcified subcutaneous lesion within the left occipital scalp, benign in appearance. IMPRESSION: 1. Stable chronic small vessel ischemic changes within the white matter. No acute intracranial process. Electronically Signed   By: Sharlet Salina M.D.   On: 04/24/2019 15:39   Procedures Procedures (including critical care time)  Medications Ordered in ED Medications  acetaminophen (TYLENOL) tablet 650 mg  (650 mg Oral Given 04/24/19 1315)  morphine 2 MG/ML injection 2 mg (2 mg Intravenous Given 04/24/19 1458)  ondansetron (ZOFRAN-ODT) disintegrating tablet 4 mg (4 mg Oral Given 04/24/19 1923)  sodium chloride 0.9 % bolus 500 mL (0 mLs Intravenous Stopped 04/24/19 2005)    ED Course  I have reviewed the triage vital signs and the nursing notes.  Pertinent labs & imaging results that were available during my care of the patient were reviewed by me and considered in my medical decision making (see chart for details).  86 year old female presents for evaluation of syncope.  Was admitted 2 days ago for similar complaints and her cardiology service.  They thought likely due to bradycardia from her atenolol.  This was withheld.  Apparently patient states she felt "not well" and was unresponsive per family that was on the phone with her.  They came to her house approximately 5 or 6 minutes later and found patient sitting in chair, nonresponsive, diaphoretic and pale.  Patient did not become responsive until they laid her on the ground.  Seemed confused initially. Back to her baseline mentation on arrival to ED.  Patient did not fall or hit her head.  Only complaint is some generalized weakness.   Labs and imaging personally reviewed and interpreted: CBC without leukocytosis, Hgb 13.3 CMP with hyperglycemia at 152, creatinine 1.18 Trop 12>>21 CT head without acute findings DG chest without acute findings EKG without STEMI   Discussed patient with Dr. Juleen China with plan to Consult Cards given recently dc from their  service.  Reassess.  Peers overall well with continued nonfocal neuro exam.  Have low suspicion for acute MI, ACS, dissection, PE, CVA as cause of her syncope.   Patient reassessed. Orthostatic vital signs negative. Continued non focal neuro exam without deficits.  Plan to consult cardiology given she was recently discharged for similar symptoms.  Consult with Dr. Katrinka Blazing with cardiology.  He  has reviewed patient's prior admission as well as her labs today.  States the patient is able to ambulate and has someone is able to stay with her overnight she may follow-up outpatient.  She will need to follow-up sooner and needs a Zio patch prior to Cardiology appointment. He will place orders for one to be sent to her house.  Patient reassessed.  She is tolerating p.o. intake.  Patient ambulatory in room without difficulty.  Discussed return precautions with patient and daughter.  Daughter plans to stay with patient.  Cardiology PA has sent in orders for Holter monitor.  They will call tomorrow to schedule a sooner appointment.   The patient has been appropriately medically screened and/or stabilized in the ED. I have low suspicion for any other emergent medical condition which would require further screening, evaluation or treatment in the ED or require inpatient management.  Patient is hemodynamically stable and in no acute distress.  Patient able to ambulate in department prior to ED.  Evaluation does not show acute pathology that would require ongoing or additional emergent interventions while in the emergency department or further inpatient treatment.  I have discussed the diagnosis with the patient and answered all questions.  Pain is been managed while in the emergency department and patient has no further complaints prior to discharge.  Patient is comfortable with plan discussed in room and is stable for discharge at this time.  I have discussed strict return precautions for returning to the emergency department.  Patient was encouraged to follow-up with PCP/specialist refer to at discharge.   Patient seen and evaluated by Dr. Effie Shy who agrees with plan to discharge and close follow up with Cardiology.    MDM Rules/Calculators/A&P                       Final Clinical Impression(s) / ED Diagnoses Final diagnoses:  Syncope, unspecified syncope type    Rx / DC Orders ED Discharge  Orders    None       Django Nguyen A, PA-C 04/24/19 2014    Mancel Bale, MD 04/24/19 2343

## 2019-04-25 ENCOUNTER — Other Ambulatory Visit: Payer: Self-pay | Admitting: *Deleted

## 2019-04-29 ENCOUNTER — Encounter: Payer: Self-pay | Admitting: Physician Assistant

## 2019-04-29 ENCOUNTER — Ambulatory Visit (INDEPENDENT_AMBULATORY_CARE_PROVIDER_SITE_OTHER): Payer: Medicare Other

## 2019-04-29 DIAGNOSIS — R55 Syncope and collapse: Secondary | ICD-10-CM | POA: Diagnosis not present

## 2019-04-29 DIAGNOSIS — R001 Bradycardia, unspecified: Secondary | ICD-10-CM | POA: Diagnosis not present

## 2019-04-29 NOTE — Progress Notes (Signed)
Cardiology Office Note    Date:  05/02/2019   ID:  Tracey Rosario, Tracey Rosario December 19, 1926, MRN [id number]  PCP:  Courtney Paris, NP  Cardiologist:  Tobias Alexander, MD  Electrophysiologist:  None   Chief Complaint: f/u syncope  History of Present Illness:   Tracey Rosario is a 86 y.o. female with history of hypertension, hypothyroidism, hyperlipidemia, GERD, CKD stage III by labs by GFR, frequent PVCs, syncope, and bradycardia who presents for post-hospital follow-up. She moved from [place] to [place] in 2015 to be closer to her daughter who is disabled. Her other daughter is in [place]. She has been on atenolol for many years for suppressing PVCs. She was evaluated by Dr. Rennis Golden in July 2015 due to frequent PVCs in trigeminy pattern. Given lack of chest discomfort, Dr. Susette Racer recommended simple echocardiogram as initial evaluation.  Echocardiogram obtained on 08/16/2013 showed EF 60 to 65%, mild AI, mild LAE, mild to moderate TR. She had not followed up with cardiology until recent admission.   On 04/22/19 she was admitted with a fall out of a chair. While walking into her room, she felt an extra heartbeat and tried to sit down in a chair but lost her balance and slipped out of the chair. There was question of syncope but the patient had no specific prodrome or knowledge of fully passing out. She was noted to have sinus bradycardia with HR in the low 40s. Her atenolol was discontinued. She was severely hypertensive so amlodipine was titrated. She was seen back in the ED on 04/24/19. Per ED note, "Apparently daughter who is present in room states she went to go and see her due to patient saying she did not feel good and had stopped talking when she found her sitting in the kitchen on a chair leaning up against a cabinet unresponsive.  Family states patient was diaphoretic, had circumoral cyanosis and cyanosis to her fingertips.  Initially could not answer questions  with family.  They laid her on the ground at that time." Patient did not become responsive until they laid her on the ground and seemed confused initially. CT head was nonacute and orthostatics were negative. She was discharged home with plan for 2-week Zio monitor and close follow-up. Last labs personally reviewed from recent presentations include K 4.2, Cr 1.08, glucose 152, CBC wnl, TSH normal but fT4 was elevated.  She is seen back for follow-up today overall doing well. She has not had any recurrent events since the last ED visit. They saw primary care on Monday who rechecked labs and increased amlodipine from 2.5mg  to 5mg  daily due to elevated blood pressures. She denies any recurrent syncope. No dizziness, CP, SOB, edema reported. She is now wearing monitor, no events called to yet.   Past Medical History:  Diagnosis Date  . Acid reflux   . Anemia   . Arthropathy   . Bradycardia   . CKD (chronic kidney disease), stage III   . Cognitive decline    referred to neurologist  . Depression 10/07/2013   daughter didn't know anything about this  . Dizziness   . Frequent PVCs   . GERD (gastroesophageal reflux disease) 05/04/2013  . Hip fx (HCC)    r hip, bilat pubis rami  . Hyperlipemia   . Hypertension   . Hypothyroidism   . Insomnia   . Multilevel degenerative disc disease   . Palpitations 05/04/2013   saw cardiologist in 2015, he deleted A. Fib from the  record as per note not documentation to support  . Short-term memory loss   . Vitamin D deficiency     Past Surgical History:  Procedure Laterality Date  . ABDOMINAL HYSTERECTOMY  1971  . BLADDER SURGERY  2010  . Broken Pelvis  2015  . C-EYE SURGERY PROCEDURE Bilateral    zaldivar  . CATARACT EXTRACTION Bilateral    Done in [place]  . HIP PINNING,CANNULATED Right 10/07/2013   Procedure: CANNULATED screws right hip;  Surgeon: Mcarthur Rossetti, MD;  Location: [place];  Service: Orthopedics;  Laterality: Right;    Current  Medications: Current Meds  Medication Sig  . amLODipine (NORVASC) 10 MG tablet Take 5 mg by mouth daily.  Marland Kitchen aspirin EC 81 MG tablet Take 162 mg by mouth at bedtime.  . Cholecalciferol (VITAMIN D3) 125 MCG (5000 UT) TABS Take 5,000 Units by mouth daily.   Marland Kitchen donepezil (ARICEPT) 10 MG tablet Take 10 mg by mouth daily.  Marland Kitchen levothyroxine (EUTHYROX) 50 MCG tablet Take 50 mcg by mouth daily before breakfast.  . lisinopril (PRINIVIL,ZESTRIL) 30 MG tablet TAKE 1 TABLET DAILY (NEED AN APPOINTMENT) (Patient taking differently: Take 30 mg by mouth daily. )  . meloxicam (MOBIC) 7.5 MG tablet Take 7.5 mg by mouth daily.   . methocarbamol (ROBAXIN) 500 MG tablet Take 1 tablet (500 mg total) by mouth 2 (two) times daily as needed. (Patient taking differently: Take 500 mg by mouth at bedtime as needed for muscle spasms. )  . Multiple Vitamins-Minerals (CENTRUM SILVER) tablet Take 1 tablet by mouth daily.  Marland Kitchen omeprazole (PRILOSEC) 20 MG capsule TAKE 1 CAPSULE DAILY (Patient taking differently: Take 20 mg by mouth daily. )  . polyvinyl alcohol (ARTIFICIAL TEARS) 1.4 % ophthalmic solution Place 1 drop into both eyes as needed for dry eyes.  . Probiotic Product (PROBIOTIC PO) Take 1 tablet by mouth daily.   . [DISCONTINUED] ALPRAZolam (XANAX) 0.25 MG tablet Take 0.25 mg by mouth daily as needed for anxiety.   . [DISCONTINUED] amLODipine (NORVASC) 10 MG tablet Take 0.5 tablets (5 mg total) by mouth daily.  . [DISCONTINUED] amLODipine (NORVASC) 2.5 MG tablet Take 2.5 mg by mouth daily.  . [DISCONTINUED] amLODipine (NORVASC) 5 MG tablet Take 5 mg by mouth daily.  . [DISCONTINUED] fluticasone (FLONASE) 50 MCG/ACT nasal spray USE 1 SPRAY IN EACH NOSTRIL DAILY (NEED APPOINTMENT)  . [DISCONTINUED] pravastatin (PRAVACHOL) 20 MG tablet TAKE 1 TABLET DAILY (NEED AN APPOINTMENT) (Patient taking differently: Take 20 mg by mouth daily. )      Allergies:   Codeine   Social History   Socioeconomic History  . Marital status:  Widowed    Spouse name: Not on file  . Number of children: 2  . Years of education: Not on file  . Highest education level: High school graduate  Occupational History  . Occupation: Retired  Tobacco Use  . Smoking status: Former Smoker    Types: Cigarettes    Quit date: 08/02/1970    Years since quitting: 48.7  . Smokeless tobacco: Never Used  . Tobacco comment: "smoked very little"  Substance and Sexual Activity  . Alcohol use: Never    Alcohol/week: 0.0 standard drinks  . Drug use: Never  . Sexual activity: Not on file  Other Topics Concern  . Not on file  Social History Narrative   Work or School: none      Home Situation: lives alone, daughter lives about 5 minutes away. One daughter lives across the street (  bedridden)      Spiritual Beliefs: [employer]      Lifestyle: no regular exercise, diet is ok      3 grand children and 3 great grands; 2 1/2 and 3 months       Caffeine: 1-2 cups of coffee/day   Social Determinants of Health   Financial Resource Strain:   . Difficulty of Paying Living Expenses:   Food Insecurity:   . Worried About Programme researcher, broadcasting/film/video in the Last Year:   . Barista in the Last Year:   Transportation Needs:   . Freight forwarder (Medical):   Marland Kitchen Lack of Transportation (Non-Medical):   Physical Activity:   . Days of Exercise per Week:   . Minutes of Exercise per Session:   Stress:   . Feeling of Stress :   Social Connections:   . Frequency of Communication with Friends and Family:   . Frequency of Social Gatherings with Friends and Family:   . Attends Religious Services:   . Active Member of Clubs or Organizations:   . Attends Banker Meetings:   Marland Kitchen Marital Status:      Family History:  The patient's family history includes Cancer in her brother, sister, and sister; Heart attack in her sister and sister; Prostate cancer in her father; Stroke in her daughter. There is no history of Dementia or Alzheimer's  disease.  ROS:   Please see the history of present illness.  All other systems are reviewed and otherwise negative.    EKGs/Labs/Other Studies Reviewed:    Studies reviewed are outlined and summarized above. Reports included below if pertinent.  2D Echo 2015 - Left ventricle: The cavity size was normal. Wall thickness was  normal. Systolic function was normal. The estimated ejection  fraction was in the range of 60% to 65%.  - Aortic valve: There was mild regurgitation.  - Left atrium: The atrium was mildly dilated.  - Right atrium: The atrium was mildly dilated.  - Tricuspid valve: There was mild-moderate regurgitation.       EKG:  EKG is ordered today and shows NSR 70bpm without any acute STT changes. Also personally reviewed from recent ER visit - NSR with borderline 1st degree AVB, otherwise no acute changes  Recent Labs: 04/22/2019: B Natriuretic Peptide 262.7; Magnesium 2.1 04/23/2019: TSH 3.068 04/24/2019: ALT 22; BUN 21; Creatinine, Ser 1.08; Hemoglobin 13.3; Platelets 200; Potassium 4.2; Sodium 139  Recent Lipid Panel    Component Value Date/Time   CHOL 192 02/22/2017 1025   TRIG 85.0 02/22/2017 1025   HDL 47.30 02/22/2017 1025   CHOLHDL 4 02/22/2017 1025   VLDL 17.0 02/22/2017 1025   LDLCALC 128 (H) 02/22/2017 1025    PHYSICAL EXAM:    VS:  BP (!) 150/72   Pulse 73   Ht 5\' 10"  (1.778 m)   Wt 146 lb 12.8 oz (66.6 kg)   SpO2 96%   BMI 21.06 kg/m   BMI: Body mass index is 21.06 kg/m.  GEN: Well nourished, well developed WF, in no acute distress HEENT: normocephalic, atraumatic Neck: no JVD, carotid bruits, or masses Cardiac: mostly regular rhythm with occasional irregularity ? Ectopy, no murmurs, rubs, or gallops, no edema  Respiratory:  clear to auscultation bilaterally, normal work of breathing GI: soft, nontender, nondistended, + BS MS: no deformity or atrophy, walks with cane Skin: warm and dry, no rash Neuro:  Alert and Oriented x 3, Strength and  sensation are intact, follows  commands Psych: euthymic mood, full affect  Wt Readings from Last 3 Encounters:  05/02/19 146 lb 12.8 oz (66.6 kg)  04/24/19 145 lb (65.8 kg)  11/12/18 167 lb (75.8 kg)     ASSESSMENT & PLAN:   1. Syncope - unclear etiology, but HR was in the 40s when EMS arrived which was concerning for bradycardic event. Atenolol was discontinued and she has had no further syncopal events. Event monitor is in progress. We do not have access to download acutely but chart indicates she is wearing a Zio AT which means acute events will be called to [place] before completion if noted. Will also obtain 2D echocardiogram given that she had 2 different events. Also advised no driving for 6 months (she was only doing short rare distances anyway). 2. Bradycardia - recommend to remain off AVB blocking agents at this time. Await monitor results. 3. History of frequent PVCs - upon auscultation today, heart rhythm sounded irregular, possibly indicative of ectopy. We obtained an EKG for completeness that showed NSR without any abnormalities. She is wearing the monitor which will be able to assess this ectopy further. 4. Essential HTN - BP is mildly elevated. Primary care just increased amlodipine earlier this week. Will continue present regimen otherwise. In the setting of recent syncope and bradycardia I would not be too aggressive with additional control until we can see the outcome of the monitor. In the future would avoid AVN blocking agents to control pressure.   Disposition: F/u with myself in 6 weeks.  Medication Adjustments/Labs and Tests Ordered: Current medicines are reviewed at length with the patient today.  Concerns regarding medicines are outlined above. Medication changes, Labs and Tests ordered today are summarized above and listed in the Patient Instructions accessible in Encounters.   Signed, Laurann Montana, PA-C  05/02/2019 9:12 AM    [facility] [address] [place], [place], [place]  [address] Phone: [phone]; Fax: [phone]

## 2019-04-30 NOTE — Progress Notes (Signed)
[facility] Note  05/03/2019     CHIEF COMPLAINT Patient presents for Retina Follow Up   HISTORY OF PRESENT ILLNESS: Tracey Rosario is a 86 y.o. female who presents to the clinic today for:   HPI    Retina Follow Up    Patient presents with  Wet AMD.  In left eye.  This started months ago.  Severity is moderate.  Duration of 6 weeks.  Since onset it is gradually improving.  I, the attending physician,  performed the HPI with the patient and updated documentation appropriately.          Comments    86 y/o female pt here for 6 wk f/u for exu ARMD OS.  No noticed change in [place] OU.  Denies pain, FOL, floaters.  AT prn OU.       Last edited by Bernarda Caffey, MD on 05/03/2019  2:13 PM. (History)    pt states    Referring physician: Simona Huh, NP [place],  [place] [address]  HISTORICAL INFORMATION:   Selected notes from the MEDICAL RECORD NUMBER Referred by Dr. Quentin Ore for retinal eval LEE: 07.20.20 [name]) [BCVA: OD: 20/25 OS: 20/60] Ocular Hx-pseudo OU, ERM OU, DES OU, ABM, corneal dystrophy, retinoschisis, HTN ret, ptosis, conj cyst PMH-HTN, HLD, headaches, anxiety   CURRENT MEDICATIONS: Current Outpatient Medications (Ophthalmic Drugs)  Medication Sig  . polyvinyl alcohol (ARTIFICIAL TEARS) 1.4 % ophthalmic solution Place 1 drop into both eyes as needed for dry eyes.   No current facility-administered medications for this visit. (Ophthalmic Drugs)   Current Outpatient Medications (Other)  Medication Sig  . amLODipine (NORVASC) 10 MG tablet Take 5 mg by mouth daily.  Marland Kitchen aspirin EC 81 MG tablet Take 162 mg by mouth at bedtime.  . Cholecalciferol (VITAMIN D3) 125 MCG (5000 UT) TABS Take 5,000 Units by mouth daily.   . citalopram (CELEXA) 20 MG tablet Take 20 mg by mouth daily.  Marland Kitchen donepezil (ARICEPT) 10 MG tablet Take 10 mg by mouth daily.  . ferrous gluconate (FERGON) 324 MG tablet Take 324 mg by mouth  daily.  Marland Kitchen levothyroxine (EUTHYROX) 50 MCG tablet Take 50 mcg by mouth daily before breakfast.  . lisinopril (PRINIVIL,ZESTRIL) 30 MG tablet TAKE 1 TABLET DAILY (NEED AN APPOINTMENT) (Patient taking differently: Take 30 mg by mouth daily. )  . meloxicam (MOBIC) 7.5 MG tablet Take 7.5 mg by mouth daily.   . methocarbamol (ROBAXIN) 500 MG tablet Take 1 tablet (500 mg total) by mouth 2 (two) times daily as needed. (Patient taking differently: Take 500 mg by mouth at bedtime as needed for muscle spasms. )  . Multiple Vitamins-Minerals (CENTRUM SILVER) tablet Take 1 tablet by mouth daily.  Marland Kitchen omeprazole (PRILOSEC) 20 MG capsule TAKE 1 CAPSULE DAILY (Patient taking differently: Take 20 mg by mouth daily. )  . Probiotic Product (PROBIOTIC PO) Take 1 tablet by mouth daily.    No current facility-administered medications for this visit. (Other)      REVIEW OF SYSTEMS: ROS    Positive for: Neurological, Musculoskeletal, Cardiovascular, Eyes   Negative for: Constitutional, Gastrointestinal, Skin, Genitourinary, HENT, Endocrine, Respiratory, Psychiatric, Allergic/Imm, Heme/Lymph   Last edited by Matthew Folks, COA on 05/03/2019  1:21 PM. (History)       ALLERGIES Allergies  Allergen Reactions  . Codeine Other (See Comments)    Makes her crazy    PAST MEDICAL HISTORY Past Medical History:  Diagnosis Date  .  Acid reflux   . Anemia   . Arthropathy   . Bradycardia   . CKD (chronic kidney disease), stage III   . Cognitive decline    referred to neurologist  . Depression 10/07/2013   daughter didn't know anything about this  . Dizziness   . Frequent PVCs   . GERD (gastroesophageal reflux disease) 05/04/2013  . Hip fx ([place])    r hip, bilat pubis rami  . Hyperlipemia   . Hypertension   . Hypertensive retinopathy    OU  . Hypothyroidism   . Insomnia   . Macular degeneration    Exu OS  . Multilevel degenerative disc disease   . Palpitations 05/04/2013   saw cardiologist in 2015, he  deleted A. Fib from the record as per note not documentation to support  . Short-term memory loss   . Vitamin D deficiency    Past Surgical History:  Procedure Laterality Date  . ABDOMINAL HYSTERECTOMY  1971  . BLADDER SURGERY  2010  . Broken Pelvis  2015  . C-EYE SURGERY PROCEDURE Bilateral    zaldivar  . CATARACT EXTRACTION Bilateral    Done in [place]  . EYE SURGERY Bilateral    Cat Sx  . HIP PINNING,CANNULATED Right 10/07/2013   Procedure: CANNULATED screws right hip;  Surgeon: Mcarthur Rossetti, MD;  Location: [facility];  Service: Orthopedics;  Laterality: Right;    FAMILY HISTORY Family History  Problem Relation Age of Onset  . Prostate cancer Father   . Heart attack Sister   . Cancer Brother   . Cancer Sister   . Heart attack Sister   . Cancer Sister   . Stroke Daughter   . Dementia Neg Hx   . Alzheimer's disease Neg Hx     SOCIAL HISTORY Social History   Tobacco Use  . Smoking status: Former Smoker    Types: Cigarettes    Quit date: 08/02/1970    Years since quitting: 48.7  . Smokeless tobacco: Never Used  . Tobacco comment: "smoked very little"  Substance Use Topics  . Alcohol use: Never    Alcohol/week: 0.0 standard drinks  . Drug use: Never         OPHTHALMIC EXAM:  Base Eye Exam    Visual Acuity (Snellen - Linear)      Right Left   Dist [place] 20/25 -2 20/40 -2   Dist ph [place] NI NI       Tonometry (Tonopen, 1:26 PM)      Right Left   Pressure 17 19       Pupils      Dark Light Shape React APD   Right 3 2 Round Brisk None   Left 3 2 Round Brisk None       Visual Fields (Counting fingers)      Left Right    Full Full       Extraocular Movement      Right Left    Full, Ortho Full, Ortho       Neuro/Psych    Oriented x3: Yes   Mood/Affect: Normal       Dilation    Both eyes: 1.0% Mydriacyl, 2.5% Phenylephrine @ 1:26 PM        Slit Lamp and Fundus Exam    Slit Lamp Exam      Right Left   Lids/Lashes Dermatochalasis -  upper lid Dermatochalasis - upper lid   Conjunctiva/Sclera White and quiet White and quiet   Cornea  Arcus, 1-2+ Punctate epithelial erosions, mild EBMD and corneal haze Arcus, 3+ Punctate epithelial erosions, EBMD, irregular epi   Anterior Chamber Deep and quiet Deep and quiet   Iris Round and dilated Round and moderately dilated to 87m   Lens PC IOL in good position with open PC PC IOL in good position with open PC   Vitreous Vitreous syneresis, Posterior vitreous detachment Vitreous syneresis; PVD; vitreous condensations       Fundus Exam      Right Left   Disc Mild temporal Pallor, Peripapillary atrophy, Sharp rim, Compact mild tilt, 2+ temporal Pallor, Peripapillary atrophy, Compact, Sharp rim   C/D Ratio 0.1 0.0   Macula Flat, Blunted foveal reflex, mild Epiretinal membrane, drusen, RPE mottling and clumping, No heme or edema Flat, Blunted foveal reflex, RPE mottling and clumping, Epiretinal membrane, No heme or edema   Vessels Vascular attenuation, Tortuous Vascular attenuation, Tortuous   Periphery bullous IT schisis cavity from 0700-0830 extending posteriorly almost to arcades otherwise attached -- stable from prior, focal pigmented CR scar at 0630 persistent temporal sub-retinal hemorrhage - dark red heme shrinking and turning white; +SRF temporally - stably resolved          IMAGING AND PROCEDURES  Imaging and Procedures for '@TODAY'$ @  OCT, Retina - OU - Both Eyes       Right Eye Quality was good. Central Foveal Thickness: 356. Progression has been stable. Findings include epiretinal membrane, normal foveal contour, no IRF, no SRF, retinal drusen , pigment epithelial detachment.   Left Eye Quality was good. Central Foveal Thickness: 330. Progression has been stable. Findings include no IRF, epiretinal membrane, retinal drusen , preretinal fibrosis, abnormal foveal contour, no SRF (Stable improvement in SRF -- still not visible on macular widescan).   Notes *Images captured  and stored on drive  Diagnosis / Impression:  NFP, no IRF/SRF centrally OU OS: stable improvement in temporal SRF -- no SRF on widefield images  Clinical management:  See below  Abbreviations: NFP - Normal foveal profile. CME - cystoid macular edema. PED - pigment epithelial detachment. IRF - intraretinal fluid. SRF - subretinal fluid. EZ - ellipsoid zone. ERM - epiretinal membrane. ORA - outer retinal atrophy. ORT - outer retinal tubulation. SRHM - subretinal hyper-reflective material        Intravitreal Injection, Pharmacologic Agent - OS - Left Eye       Time Out 05/03/2019. 1:29 PM. Confirmed correct patient, procedure, site, and patient consented.   Anesthesia Topical anesthesia was used. Anesthetic medications included Lidocaine 2%, Proparacaine 0.5%.   Procedure Preparation included 5% betadine to ocular surface, eyelid speculum. A (33g) needle was used.   Injection:  2 mg aflibercept (Alfonse Flavors SOLN   NDC: [id number] Lot: [id number] Expiration date: 09/30/2019   Route: Intravitreal, Site: Left Eye, Waste: 0.05 mL  Post-op The patient tolerated the procedure well. There were no complications. The patient received written and verbal post procedure care education.        Color Fundus Photography Optos - OU - Both Eyes       Right Eye Progression has been stable. Disc findings include normal observations. Macula : retinal pigment epithelium abnormalities. Vessels : attenuated, tortuous vessels. Periphery : RPE abnormality (IT schisis -- stable from prior).   Left Eye Progression has been stable. Disc findings include normal observations. Macula : normal observations. Vessels : attenuated. Periphery : RPE abnormality, hemorrhage (improvement in sub retinal hemorrhage temporally (0200)).   Notes **Images stored on drive**  Impression:  OD: bullous retinoschisis, IT periphery -- stable from prior OS: PEHCR with improved sub retinal hemorrhage temporally (0200)                   ASSESSMENT/PLAN:    ICD-10-CM   1. Exudative age-related macular degeneration of left eye with active choroidal neovascularization (HCC)  H35.3221 Intravitreal Injection, Pharmacologic Agent - OS - Left Eye    aflibercept (EYLEA) SOLN 2 mg  2. Subretinal hemorrhage of left eye  H35.62 Color Fundus Photography Optos - OU - Both Eyes  3. Serous retinal detachment of left eye  H33.22   4. Retinal edema  H35.81 OCT, Retina - OU - Both Eyes  5. 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 Essential hypertension  I10   8. Hypertensive retinopathy of both eyes  H35.033   9. Pseudophakia of both eyes  Z96.1     1-4. 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 w/ shallow SRF/serous RD  - initially presented with symptomatic floaters OS  - initial exam showed large peripheral temporal subretinal hemorrhage OS -- spanning 2-430 -- also mild vitreous opacities and condensations that were causing symptomatic floaters  - s/p IVA OS #1 (08.05.20) for subretinal hemorrhage, peripheral CNV  - s/p IVE OS #1 (09.02.20) - sample, #2 (10.12.20), #3 (11.11.20), #4 (12.09.20), #5 (1.20.21), #6 (02.17.21), #7 (03.19.21)  - today BCVA 20/50 OS  - exam shows stable improvement in subretinal heme and shallow SRF  - repeat color Optos image obtained -- peripheral SRH continues to improve  - OCT shows stable improvement in SRF -- not visible on widefield OCT   - differential includes choroidal melanoma or other subretinal mass -- pt with +history of skin melanoma  - B-scan (9.18.20) showed hyperreflective, ill-defined mass, 0300 periphery OS  - was seen by Dr. Daralene Milch, Ocular Oncologist, on 9.22.2020 -- no tumor on exam or b-scan  - recommend IVE OS #8 today, 04.30.21 -- for exudative ARMD w/ peripheral subretinal heme -- with extension to 6-7 weeks  - pt wishes to proceed  - RBA of procedure discussed, questions answered  - informed consent  obtained  - Eylea informed consent form signed and scanned on 01.20.2021  - see procedure note  - Eylea4U Benefits investigation initiated 09.02.2020 -- approved for 2021  - f/u in 6-7 wks, sooner prn, OCT/DFE/optos colors, possible injection  5. Epiretinal membrane, OU.  - mild ERM OU  - asymptomatic, no metamorphopsia  - no indication for surgery at this time  - monitor for now  6. Retinoschisis OD -- stable  - bullous schisis cavity, IT quadrant, from 0700-0830, extends posteriorly almost to arcades  - no associated RT/RD  - stable from prior  - discussed findings and prognosis  - recommend monitoring for now  - if progressing posteriorly, consider intervention / laser retinopexy  7,8. Hypertensive retinopathy OU  - discussed importance of tight BP control  - monitor  9. Pseudophakia OU  - s/p CE/IOL OU  - beautiful surgery, doing well  - monitor   Ophthalmic Meds Ordered this visit:  Meds ordered this encounter  Medications  . aflibercept (EYLEA) SOLN 2 mg       Return for 6-7 wks - f/u PEHCR OS - , Dilated Exam, OCT, Possible Injxn.  There are no Patient Instructions on file for this visit.   Explained the diagnoses, plan, and follow up with the patient and they expressed  understanding.  Patient expressed understanding of the importance of proper follow up care.  This document serves as a record of services personally performed by Gardiner Sleeper, MD, PhD. It was created on their behalf by Ernest Mallick, OA, an ophthalmic assistant. The creation of this record is the provider's dictation and/or activities during the visit.    Electronically signed by: Ernest Mallick, OA 04.27.2021 11:43 AM   Gardiner Sleeper, M.D., Ph.D. Diseases & Surgery of the Retina and Vitreous [facility]  I have reviewed the above documentation for accuracy and completeness, and I agree with the above. Gardiner Sleeper, M.D., Ph.D. 05/06/19 11:43  AM     Abbreviations: M myopia (nearsighted); A astigmatism; H hyperopia (farsighted); P presbyopia; Mrx spectacle prescription;  CTL contact lenses; OD right eye; OS left eye; OU both eyes  XT exotropia; ET esotropia; PEK punctate epithelial keratitis; PEE punctate epithelial erosions; DES dry eye syndrome; MGD meibomian gland dysfunction; ATs artificial tears; PFAT's preservative free artificial tears; [place] nuclear sclerotic cataract; PSC posterior subcapsular cataract; ERM epi-retinal membrane; PVD posterior vitreous detachment; RD retinal detachment; DM diabetes mellitus; DR diabetic retinopathy; NPDR non-proliferative diabetic retinopathy; PDR proliferative diabetic retinopathy; CSME clinically significant macular edema; DME diabetic macular edema; dbh dot blot hemorrhages; CWS cotton wool spot; POAG primary open angle glaucoma; C/D cup-to-disc ratio; HVF humphrey visual field; GVF goldmann visual field; OCT optical coherence tomography; IOP intraocular pressure; BRVO Branch retinal vein occlusion; CRVO central retinal vein occlusion; CRAO central retinal artery occlusion; BRAO branch retinal artery occlusion; RT retinal tear; SB scleral buckle; PPV pars plana vitrectomy; VH Vitreous hemorrhage; PRP panretinal laser photocoagulation; IVK intravitreal kenalog; VMT vitreomacular traction; MH Macular hole;  NVD neovascularization of the disc; NVE neovascularization elsewhere; AREDS age related eye disease study; ARMD age related macular degeneration; POAG primary open angle glaucoma; EBMD epithelial/anterior basement membrane dystrophy; ACIOL anterior chamber intraocular lens; IOL intraocular lens; PCIOL posterior chamber intraocular lens; Phaco/IOL phacoemulsification with intraocular lens placement; [place] photorefractive keratectomy; LASIK laser assisted in situ keratomileusis; HTN hypertension; DM diabetes mellitus; COPD chronic obstructive pulmonary disease

## 2019-05-02 ENCOUNTER — Other Ambulatory Visit: Payer: Self-pay

## 2019-05-02 ENCOUNTER — Encounter: Payer: Self-pay | Admitting: Physician Assistant

## 2019-05-02 ENCOUNTER — Ambulatory Visit (INDEPENDENT_AMBULATORY_CARE_PROVIDER_SITE_OTHER): Payer: Medicare Other | Admitting: Physician Assistant

## 2019-05-02 VITALS — BP 150/72 | HR 73 | Ht 70.0 in | Wt 146.8 lb

## 2019-05-02 DIAGNOSIS — R001 Bradycardia, unspecified: Secondary | ICD-10-CM

## 2019-05-02 DIAGNOSIS — R55 Syncope and collapse: Secondary | ICD-10-CM

## 2019-05-02 DIAGNOSIS — I10 Essential (primary) hypertension: Secondary | ICD-10-CM

## 2019-05-02 DIAGNOSIS — I493 Ventricular premature depolarization: Secondary | ICD-10-CM

## 2019-05-02 NOTE — Patient Instructions (Addendum)
Medication Instructions:  Your physician recommends that you continue on your current medications as directed. Please refer to the Current Medication list given to you today.  *If you need a refill on your cardiac medications before your next appointment, please call your pharmacy*   Lab Work: None ordered  If you have labs (blood work) drawn today and your tests are completely normal, you will receive your results only by: Marland Kitchen MyChart Message (if you have MyChart) OR . A paper copy in the mail If you have any lab test that is abnormal or we need to change your treatment, we will call you to review the results.   Testing/Procedures: Your physician has requested that you have an echocardiogram. Echocardiography is a painless test that uses sound waves to create images of your heart. It provides your doctor with information about the size and shape of your heart and how well your heart's chambers and valves are working. This procedure takes approximately one hour. There are no restrictions for this procedure.    Follow-Up: At [facility], you and your health needs are our priority.  As part of our continuing mission to provide you with exceptional heart care, we have created designated Provider Care Teams.  These Care Teams include your primary Cardiologist (physician) and Advanced Practice Providers (APPs -  Physician Assistants and Nurse Practitioners) who all work together to provide you with the care you need, when you need it.  We recommend signing up for the patient portal called "MyChart".  Sign up information is provided on this After Visit Summary.  MyChart is used to connect with patients for Virtual Visits (Telemedicine).  Patients are able to view lab/test results, encounter notes, upcoming appointments, etc.  Non-urgent messages can be sent to your provider as well.   To learn more about what you can do with MyChart, go to [email or web address].    Your next appointment:   6  WEEKS 06/06/2019 ARRIVE AT 10:00 FOR REGISTRATION TO SEE   The format for your next appointment:   In Person  Provider:   You may see Ena Dawley, MD or one of the following Advanced Practice Providers on your designated Care Team:    Melina Copa, PA-C  Ermalinda Barrios, PA-C    Other Instructions  Echocardiogram An echocardiogram is a procedure that uses painless sound waves (ultrasound) to produce an image of the heart. Images from an echocardiogram can provide important information about:  Signs of coronary artery disease (CAD).  Aneurysm detection. An aneurysm is a weak or damaged part of an artery wall that bulges out from the normal force of blood pumping through the body.  Heart size and shape. Changes in the size or shape of the heart can be associated with certain conditions, including heart failure, aneurysm, and CAD.  Heart muscle function.  Heart valve function.  Signs of a past heart attack.  Fluid buildup around the heart.  Thickening of the heart muscle.  A tumor or infectious growth around the heart valves. Tell a health care provider about:  Any allergies you have.  All medicines you are taking, including vitamins, herbs, eye drops, creams, and over-the-counter medicines.  Any blood disorders you have.  Any surgeries you have had.  Any medical conditions you have.  Whether you are pregnant or may be pregnant. What are the risks? Generally, this is a safe procedure. However, problems may occur, including:  Allergic reaction to dye (contrast) that may be used during the procedure. What happens  before the procedure? No specific preparation is needed. You may eat and drink normally. What happens during the procedure?   An IV tube may be inserted into one of your veins.  You may receive contrast through this tube. A contrast is an injection that improves the quality of the pictures from your heart.  A gel will be applied to your chest.  A  wand-like tool (transducer) will be moved over your chest. The gel will help to transmit the sound waves from the transducer.  The sound waves will harmlessly bounce off of your heart to allow the heart images to be captured in real-time motion. The images will be recorded on a computer. The procedure may vary among health care providers and hospitals. What happens after the procedure?  You may return to your normal, everyday life, including diet, activities, and medicines, unless your health care provider tells you not to do that. Summary  An echocardiogram is a procedure that uses painless sound waves (ultrasound) to produce an image of the heart.  Images from an echocardiogram can provide important information about the size and shape of your heart, heart muscle function, heart valve function, and fluid buildup around your heart.  You do not need to do anything to prepare before this procedure. You may eat and drink normally.  After the echocardiogram is completed, you may return to your normal, everyday life, unless your health care provider tells you not to do that. This information is not intended to replace advice given to you by your health care provider. Make sure you discuss any questions you have with your health care provider. Document Revised: 04/12/2018 Document Reviewed: 01/23/2016 Elsevier Patient Education  2020 [employer].

## 2019-05-03 ENCOUNTER — Ambulatory Visit (INDEPENDENT_AMBULATORY_CARE_PROVIDER_SITE_OTHER): Payer: Medicare Other | Admitting: Ophthalmology

## 2019-05-03 ENCOUNTER — Encounter (INDEPENDENT_AMBULATORY_CARE_PROVIDER_SITE_OTHER): Payer: Self-pay | Admitting: Ophthalmology

## 2019-05-03 DIAGNOSIS — H3562 Retinal hemorrhage, left eye: Secondary | ICD-10-CM | POA: Diagnosis not present

## 2019-05-03 DIAGNOSIS — H33101 Unspecified retinoschisis, right eye: Secondary | ICD-10-CM

## 2019-05-03 DIAGNOSIS — H3322 Serous retinal detachment, left eye: Secondary | ICD-10-CM | POA: Diagnosis not present

## 2019-05-03 DIAGNOSIS — H3581 Retinal edema: Secondary | ICD-10-CM

## 2019-05-03 DIAGNOSIS — Z961 Presence of intraocular lens: Secondary | ICD-10-CM

## 2019-05-03 DIAGNOSIS — H35373 Puckering of macula, bilateral: Secondary | ICD-10-CM

## 2019-05-03 DIAGNOSIS — H353221 Exudative age-related macular degeneration, left eye, with active choroidal neovascularization: Secondary | ICD-10-CM

## 2019-05-03 DIAGNOSIS — I10 Essential (primary) hypertension: Secondary | ICD-10-CM

## 2019-05-03 DIAGNOSIS — H35033 Hypertensive retinopathy, bilateral: Secondary | ICD-10-CM

## 2019-05-03 MED ORDER — AFLIBERCEPT 2MG/0.05ML IZ SOLN FOR KALEIDOSCOPE
2.0000 mg | INTRAVITREAL | Status: AC | PRN
  Administered 2019-05-03: 2 mg via INTRAVITREAL

## 2019-05-29 ENCOUNTER — Ambulatory Visit (HOSPITAL_COMMUNITY): Payer: Medicare Other | Attending: Cardiology

## 2019-05-29 ENCOUNTER — Other Ambulatory Visit: Payer: Self-pay

## 2019-05-29 DIAGNOSIS — R55 Syncope and collapse: Secondary | ICD-10-CM | POA: Insufficient documentation

## 2019-05-30 ENCOUNTER — Telehealth: Payer: Self-pay | Admitting: Physician Assistant

## 2019-05-30 NOTE — Telephone Encounter (Signed)
Returned call to Darl Pikes, pt's daughter, pt gave verbal permission, see below. She has been made aware of echocardiogram results and all, if any, questions have been answered.

## 2019-05-30 NOTE — Telephone Encounter (Signed)
-----   Message from Laurann Montana, [place] sent at 05/30/2019  9:04 AM EDT ----- Echo done for syncope. Please let pt know echo shows strong heart, some stiffness which is not uncommon in her age. Mildly elevated BP in her lungs but right side of heart moves fine which is good. There is mild narrowing and leaking of aortic valve, again no acute concern here. Overall echo looks good and no overt causes for passing out based on this. Await monitor results. F/u as planned.

## 2019-05-30 NOTE — Telephone Encounter (Signed)
Patient's daughter returning call for echo results. She states she is now currently with the patient. Spoke with the patient who gave verbal consent for the nurse to give her daughter Tracey Rosario her echo results.

## 2019-06-03 ENCOUNTER — Encounter: Payer: Self-pay | Admitting: Physician Assistant

## 2019-06-03 NOTE — Progress Notes (Addendum)
Cardiology Office Note    Date:  06/06/2019   ID:  Tracey, Rosario 08/29/1926, MRN [id number]  PCP:  Simona Huh, NP  Cardiologist:  Ena Dawley, MD  Electrophysiologist:  None   Chief Complaint: f/u monitor and syncope  History of Present Illness:   Tracey Rosario is a 86 y.o. female with history of hypertension, hypothyroidism, hyperlipidemia, GERD, CKD stage III by labs by GFR, frequent PVCs, syncope, and bradycardia who presents for post-hospital follow-up.   She moved from [place], [place] to [place] in 2015 to be closer to her daughter who is disabled. Her other daughter is in [place]. She has been on atenolol for many years for suppressing PVCs. She was evaluated by Dr. Debara Pickett in July 2015 due to frequent PVCs in trigeminy pattern. Given lack of chest discomfort, Dr. Selena Batten recommended simple echocardiogram as initial evaluation. Echocardiogram obtained on 08/2013 showed EF 60 to 65%. She had not seen cardiology back until recently. On 04/22/19 she was admitted with a fall out of a chair. While walking into her room, she felt an extra heartbeat and tried to sit down in a chair but lost her balance and slipped out of the chair. There was question of syncope but the patient had no specific prodrome or knowledge of fully passing out. She was noted to have sinus bradycardia with HR in the low 40s. Her atenolol was discontinued. She was severely hypertensive so amlodipine was titrated. She was seen back in the ED on 04/24/19. Per ED note, "Apparently daughter who is present in room states she went to go and see her due to patient saying she did not feel good and had stopped talking when she found her sitting in the kitchen on a chair leaning up against a cabinet unresponsive. Family states patient was diaphoretic, had circumoral cyanosis and cyanosis to her fingertips. Initially could not answer questions with family. They laid her on the ground at that  time." Patient did not become responsive until they laid her on the ground and seemed confused initially. CT head was nonacute and orthostatics were negative. She was discharged home with plan for 2-week Zio monitor and close follow-up. Last labs personally reviewed from recent presentations include K 4.2, Cr 1.08, glucose 152, CBC wnl, TSH normal but fT4 was elevated.   Zio monitor showed sinus bradycardia to sinus tachycardia, lowest HR 37bpm at 11:53am on 5/6, also 41bpm on 5/2 at 5:35am, average HR 66bpm, no AVB or atrial fib, several short runs of SVT (longest lasting 17 seconds), occasional PACs/PVCs. I reviewed her monitor report with Dr. Meda Coffee who felt that perhaps the lowest HR of 37 was after a PVC which is why it is not on record, and in the absence of symptoms would continue conservative management as planned. Repeat echo 05/2019 showed EF 65-70%, grade 2 DD, normal RV pressure with mildly elevated PASP, mildly dilated LAE, mild aortic stenosis/regurgitation.  She is seen back for follow-up today with her daughter and has no acute complaints. She has some generalized fatigue, but no CP, dizziness or recurrent near-syncope or syncope. Breathing is stable. No edema. She has been working with PT at home.   Past Medical History:  Diagnosis Date  . Acid reflux   . Anemia   . Arthropathy   . Bradycardia    beta blocker reduced in 2021 due to this  . CKD (chronic kidney disease), stage III   . Cognitive decline    referred to neurologist  .  Depression 10/07/2013   daughter didn't know anything about this  . Dizziness   . Frequent PVCs   . GERD (gastroesophageal reflux disease) 05/04/2013  . Hip fx (HCC)    r hip, bilat pubis rami  . Hyperlipemia   . Hypertension   . Hypertensive retinopathy    OU  . Hypothyroidism   . Insomnia   . Macular degeneration    Exu OS  . Multilevel degenerative disc disease   . Palpitations 05/04/2013   saw cardiologist in 2015, he deleted A. Fib from the  record as per note not documentation to support  . Premature atrial contractions   . PSVT (paroxysmal supraventricular tachycardia) (HCC)    a. short runs by monitor in 2021.  Marland Kitchen Short-term memory loss   . Vitamin D deficiency     Past Surgical History:  Procedure Laterality Date  . ABDOMINAL HYSTERECTOMY  1971  . BLADDER SURGERY  2010  . Broken Pelvis  2015  . C-EYE SURGERY PROCEDURE Bilateral    zaldivar  . CATARACT EXTRACTION Bilateral    Done in [place]  . EYE SURGERY Bilateral    Cat Sx  . HIP PINNING,CANNULATED Right 10/07/2013   Procedure: CANNULATED screws right hip;  Surgeon: Kathryne Hitch, MD;  Location: [facility] OR;  Service: Orthopedics;  Laterality: Right;    Current Medications: Current Meds  Medication Sig  . amLODipine (NORVASC) 5 MG tablet Take 5 mg by mouth daily.  . Cholecalciferol (VITAMIN D3) 125 MCG (5000 UT) TABS Take 5,000 Units by mouth daily.   . citalopram (CELEXA) 20 MG tablet Take 20 mg by mouth daily.  Marland Kitchen donepezil (ARICEPT) 10 MG tablet Take 10 mg by mouth daily.  . ferrous gluconate (FERGON) 324 MG tablet Take 324 mg by mouth daily.  Marland Kitchen levothyroxine (EUTHYROX) 50 MCG tablet Take 50 mcg by mouth daily before breakfast.  . lisinopril (PRINIVIL,ZESTRIL) 30 MG tablet TAKE 1 TABLET DAILY (NEED AN APPOINTMENT)  . meloxicam (MOBIC) 7.5 MG tablet Take 7.5 mg by mouth daily.   . methocarbamol (ROBAXIN) 500 MG tablet Take 1 tablet (500 mg total) by mouth 2 (two) times daily as needed.  . Multiple Vitamins-Minerals (CENTRUM SILVER) tablet Take 1 tablet by mouth daily.  Marland Kitchen omeprazole (PRILOSEC) 20 MG capsule TAKE 1 CAPSULE DAILY  . polyvinyl alcohol (ARTIFICIAL TEARS) 1.4 % ophthalmic solution Place 1 drop into both eyes as needed for dry eyes.  . Probiotic Product (PROBIOTIC PO) Take 1 tablet by mouth daily.   . [DISCONTINUED] amLODipine (NORVASC) 10 MG tablet Take 5 mg by mouth daily.  . [DISCONTINUED] aspirin EC 81 MG tablet Take 162 mg by mouth at  bedtime.      Allergies:   Codeine   Social History   Socioeconomic History  . Marital status: Widowed    Spouse name: Not on file  . Number of children: 2  . Years of education: Not on file  . Highest education level: High school graduate  Occupational History  . Occupation: Retired  Tobacco Use  . Smoking status: Former Smoker    Types: Cigarettes    Quit date: 08/02/1970    Years since quitting: 48.8  . Smokeless tobacco: Never Used  . Tobacco comment: "smoked very little"  Substance and Sexual Activity  . Alcohol use: Never    Alcohol/week: 0.0 standard drinks  . Drug use: Never  . Sexual activity: Not on file  Other Topics Concern  . Not on file  Social History Narrative  Work or School: none      Home Situation: lives alone, daughter lives about 5 minutes away. One daughter lives across the street (bedridden)      Spiritual Beliefs: [employer]      Lifestyle: no regular exercise, diet is ok      3 grand children and 3 great grands; 2 1/2 and 3 months       Caffeine: 1-2 cups of coffee/day   Social Determinants of Health   Financial Resource Strain:   . Difficulty of Paying Living Expenses:   Food Insecurity:   . Worried About Programme researcher, broadcasting/film/video in the Last Year:   . Barista in the Last Year:   Transportation Needs:   . Freight forwarder (Medical):   Marland Kitchen Lack of Transportation (Non-Medical):   Physical Activity:   . Days of Exercise per Week:   . Minutes of Exercise per Session:   Stress:   . Feeling of Stress :   Social Connections:   . Frequency of Communication with Friends and Family:   . Frequency of Social Gatherings with Friends and Family:   . Attends Religious Services:   . Active Member of Clubs or Organizations:   . Attends Banker Meetings:   Marland Kitchen Marital Status:      Family History:  The patient's family history includes Cancer in her brother, sister, and sister; Heart attack in her sister and sister; Prostate  cancer in her father; Stroke in her daughter. There is no history of Dementia or Alzheimer's disease.  ROS:   Please see the history of present illness.  All other systems are reviewed and otherwise negative.    EKGs/Labs/Other Studies Reviewed:    Studies reviewed are outlined and summarized above. Reports included below if pertinent.  2D echo 05/2019  1. Left ventricular ejection fraction, by estimation, is 65 to 70%. The  left ventricle has normal function. The left ventricle has no regional  wall motion abnormalities. There is mild left ventricular hypertrophy.  Left ventricular diastolic parameters  are consistent with Grade II diastolic dysfunction (pseudonormalization).  Elevated left atrial pressure.  2. Right ventricular systolic function is normal. The right ventricular  size is normal. There is mildly elevated pulmonary artery systolic  pressure.  3. Left atrial size was mildly dilated.  4. The mitral valve is normal in structure. Trivial mitral valve  regurgitation.  5. The aortic valve was not well visualized. Significant calcification at  noncoronary cusp. Aortic valve regurgitation is mild. Mild aortic valve  stenosis. Vmax 2.4 m/s, MG 10 mmHg, AVA 2.1 cm^2, DI 0.6   Monitor 05/30/19 Patient had a min HR of 37 bpm, max HR of 174 bpm, and avg HR of 66 bpm. Predominant underlying rhythm was Sinus Rhythm. 183 Supraventricular Tachycardia runs occurred, the run with the fastest interval lasting 4 beats with a max rate of 174 bpm, the longest lasting 17.7 secs with an avg rate of 128 bpm. Isolated SVEs were occasional (2.8%, 36990), SVE Couplets were rare (<1.0%, 1028), and SVE Triplets were rare (<1.0%, 107). Isolated VEs were occasional (1.1%, 13946), VE Couplets were rare (<1.0%, 105), and VE Triplets were rare (<1.0%, 1).  MD review:  Sinus bradycardia to sinus tachycardia. Average HR 66 BPM.  No AVB.  No atrial fibrillation.  Short runs of SVTs.  Occasional PACs and PVCs. Predominant underlying rhythm was Sinus Rhythm. No AVB or significant arrhythmias.     EKG:  EKG is not ordered  today  Recent Labs: 04/22/2019: B Natriuretic Peptide 262.7; Magnesium 2.1 04/23/2019: TSH 3.068 04/24/2019: ALT 22; BUN 21; Creatinine, Ser 1.08; Hemoglobin 13.3; Platelets 200; Potassium 4.2; Sodium 139  Recent Lipid Panel    Component Value Date/Time   CHOL 192 02/22/2017 1025   TRIG 85.0 02/22/2017 1025   HDL 47.30 02/22/2017 1025   CHOLHDL 4 02/22/2017 1025   VLDL 17.0 02/22/2017 1025   LDLCALC 128 (H) 02/22/2017 1025    PHYSICAL EXAM:    VS:  BP (!) 142/60   Pulse (!) 59   Ht 5\' 10"  (1.778 m)   Wt 149 lb 12.8 oz (67.9 kg)   SpO2 96%   BMI 21.49 kg/m   BMI: Body mass index is 21.49 kg/m.  GEN: Well nourished, well developed elderly WF, in no acute distress HEENT: normocephalic, atraumatic Neck: no JVD, carotid bruits, or masses Cardiac: RRR; no murmurs, rubs, or gallops, no edema  Respiratory:  clear to auscultation bilaterally, normal work of breathing GI: soft, nontender, nondistended, + BS MS: no deformity or atrophy Skin: warm and dry, no rash, Lifealert bracelet on Neuro:  Alert and Oriented x 3, Strength and sensation are intact, follows commands Psych: euthymic mood, full affect  Wt Readings from Last 3 Encounters:  06/06/19 149 lb 12.8 oz (67.9 kg)  05/02/19 146 lb 12.8 oz (66.6 kg)  04/24/19 145 lb (65.8 kg)     ASSESSMENT & PLAN:   1. Syncope with recent bradycardia - per review of monitor with Dr. 04/26/19, no overtly concerning findings. Keep off beta blocker for now and follow for recurrent symptoms. Warning sx reviewed. She was told many years ago to take 2 baby aspirin a day by a provider in [place]. I do not see an indication for higher dose aspirin so will reduce to 81mg  daily. If she begins to have any falls or anemia in the future would consider discontinuation completely. 2. PVCs, PACs and PSVT by monitor -  asymptomatic with this. Continue conservative approach to management. No BB due to bradycardia. 3. Essential HTN - BP upper limits of normal but given age and h/o syncope, would not be too aggressive with treatment - current value is acceptable. 4. Mild aortic stenosis and aortic regurgitation - no symptoms related to this presently. Severity does not require any intervention at this time. Will defer timing of f/u echocardiogram to primary cardiologist in follow-up.   Disposition: F/u with Dr. [place] in 6 months.  Medication Adjustments/Labs and Tests Ordered: Current medicines are reviewed at length with the patient today.  Concerns regarding medicines are outlined above. Medication changes, Labs and Tests ordered today are summarized above and listed in the Patient Instructions accessible in Encounters.   Signed, , PA-C  06/06/2019 10:56 AM    [facility] [address] [place], [place], [place]  [place] Phone: [phone]; Fax: [phone]

## 2019-06-06 ENCOUNTER — Ambulatory Visit (INDEPENDENT_AMBULATORY_CARE_PROVIDER_SITE_OTHER): Payer: Medicare Other | Admitting: Physician Assistant

## 2019-06-06 ENCOUNTER — Encounter: Payer: Self-pay | Admitting: Physician Assistant

## 2019-06-06 ENCOUNTER — Other Ambulatory Visit: Payer: Self-pay

## 2019-06-06 VITALS — BP 142/60 | HR 59 | Ht 70.0 in | Wt 149.8 lb

## 2019-06-06 DIAGNOSIS — R55 Syncope and collapse: Secondary | ICD-10-CM | POA: Diagnosis not present

## 2019-06-06 DIAGNOSIS — R001 Bradycardia, unspecified: Secondary | ICD-10-CM | POA: Diagnosis not present

## 2019-06-06 DIAGNOSIS — I471 Supraventricular tachycardia, unspecified: Secondary | ICD-10-CM

## 2019-06-06 DIAGNOSIS — I10 Essential (primary) hypertension: Secondary | ICD-10-CM

## 2019-06-06 DIAGNOSIS — I350 Nonrheumatic aortic (valve) stenosis: Secondary | ICD-10-CM

## 2019-06-06 DIAGNOSIS — I491 Atrial premature depolarization: Secondary | ICD-10-CM

## 2019-06-06 DIAGNOSIS — I351 Nonrheumatic aortic (valve) insufficiency: Secondary | ICD-10-CM

## 2019-06-06 DIAGNOSIS — I493 Ventricular premature depolarization: Secondary | ICD-10-CM | POA: Diagnosis not present

## 2019-06-06 MED ORDER — ASPIRIN EC 81 MG PO TBEC: 81.0000 mg | DELAYED_RELEASE_TABLET | Freq: Every day | ORAL | 3 refills | Status: DC

## 2019-06-06 NOTE — Patient Instructions (Signed)
Medication Instructions:  Your physician has recommended you make the following change in your medication:  1.  REDUCE the Aspirin to 81 mg taking 1 nightly   *If you need a refill on your cardiac medications before your next appointment, please call your pharmacy*   Lab Work: None ordered  If you have labs (blood work) drawn today and your tests are completely normal, you will receive your results only by: Marland Kitchen MyChart Message (if you have MyChart) OR . A paper copy in the mail If you have any lab test that is abnormal or we need to change your treatment, we will call you to review the results.   Testing/Procedures: None ordered   Follow-Up: At [facility], you and your health needs are our priority.  As part of our continuing mission to provide you with exceptional heart care, we have created designated Provider Care Teams.  These Care Teams include your primary Cardiologist (physician) and Advanced Practice Providers (APPs -  Physician Assistants and Nurse Practitioners) who all work together to provide you with the care you need, when you need it.  We recommend signing up for the patient portal called "MyChart".  Sign up information is provided on this After Visit Summary.  MyChart is used to connect with patients for Virtual Visits (Telemedicine).  Patients are able to view lab/test results, encounter notes, upcoming appointments, etc.  Non-urgent messages can be sent to your provider as well.   To learn more about what you can do with MyChart, go to [email or web address].    Your next appointment:   6 month(s)  The format for your next appointment:   In Person  Provider:   You may see Tobias Alexander, MD or one of the following Advanced Practice Providers on your designated Care Team:    Ronie Spies, PA-C  Jacolyn Reedy, PA-C    Other Instructions

## 2019-06-18 NOTE — Progress Notes (Addendum)
[facility] - Clinic Note  06/19/2019     CHIEF COMPLAINT Patient presents for Retina Follow Up   HISTORY OF PRESENT ILLNESS: Tracey Rosario is a 86 y.o. female who presents to the clinic today for:   HPI    Retina Follow Up    Patient presents with  Wet AMD.  In left eye.  This started 7 weeks ago.  Severity is moderate.  I, the attending physician,  performed the HPI with the patient and updated documentation appropriately.          Comments    Patient here for 7 weeks retina follow up for exu ARMD OD. Patient states vision about the same. No eye pain.        Last edited by Rennis Chris, MD on 06/20/2019 11:49 PM. (History)    pt states    Referring physician: Courtney Paris, NP [address] [place],  [place] [address]  HISTORICAL INFORMATION:   Selected notes from the MEDICAL RECORD NUMBER Referred by Dr. Baker Pierini for retinal eval   CURRENT MEDICATIONS: Current Outpatient Medications (Ophthalmic Drugs)  Medication Sig  . polyvinyl alcohol (ARTIFICIAL TEARS) 1.4 % ophthalmic solution Place 1 drop into both eyes as needed for dry eyes.   No current facility-administered medications for this visit. (Ophthalmic Drugs)   Current Outpatient Medications (Other)  Medication Sig  . amLODipine (NORVASC) 5 MG tablet Take 5 mg by mouth daily.  Marland Kitchen aspirin EC 81 MG tablet Take 1 tablet (81 mg total) by mouth daily.  . Cholecalciferol (VITAMIN D3) 125 MCG (5000 UT) TABS Take 5,000 Units by mouth daily.   . citalopram (CELEXA) 20 MG tablet Take 20 mg by mouth daily.  Marland Kitchen donepezil (ARICEPT) 10 MG tablet Take 10 mg by mouth daily.  . ferrous gluconate (FERGON) 324 MG tablet Take 324 mg by mouth daily.  Marland Kitchen levothyroxine (EUTHYROX) 50 MCG tablet Take 50 mcg by mouth daily before breakfast.  . lisinopril (PRINIVIL,ZESTRIL) 30 MG tablet TAKE 1 TABLET DAILY (NEED AN APPOINTMENT)  . meloxicam (MOBIC) 7.5 MG tablet Take 7.5 mg by mouth daily.   .  methocarbamol (ROBAXIN) 500 MG tablet Take 1 tablet (500 mg total) by mouth 2 (two) times daily as needed.  . Multiple Vitamins-Minerals (CENTRUM SILVER) tablet Take 1 tablet by mouth daily.  Marland Kitchen omeprazole (PRILOSEC) 20 MG capsule TAKE 1 CAPSULE DAILY  . Probiotic Product (PROBIOTIC PO) Take 1 tablet by mouth daily.    No current facility-administered medications for this visit. (Other)      REVIEW OF SYSTEMS: ROS    Positive for: Neurological, Musculoskeletal, Cardiovascular, Eyes   Negative for: Constitutional, Gastrointestinal, Skin, Genitourinary, HENT, Endocrine, Respiratory, Psychiatric, Allergic/Imm, Heme/Lymph   Last edited by Laddie Aquas, COA on 06/19/2019  2:30 PM. (History)       ALLERGIES Allergies  Allergen Reactions  . Codeine Other (See Comments)    Makes her crazy    PAST MEDICAL HISTORY Past Medical History:  Diagnosis Date  . Acid reflux   . Anemia   . Arthropathy   . Bradycardia    beta blocker reduced in 2021 due to this  . CKD (chronic kidney disease), stage III   . Cognitive decline    referred to neurologist  . Depression 10/07/2013   daughter didn't know anything about this  . Dizziness   . Frequent PVCs   . GERD (gastroesophageal reflux disease) 05/04/2013  . Hip fx (HCC)  r hip, bilat pubis rami  . Hyperlipemia   . Hypertension   . Hypertensive retinopathy    OU  . Hypothyroidism   . Insomnia   . Macular degeneration    Exu OS  . Multilevel degenerative disc disease   . Palpitations 05/04/2013   saw cardiologist in 2015, he deleted A. Fib from the record as per note not documentation to support  . Premature atrial contractions   . PSVT (paroxysmal supraventricular tachycardia) (HCC)    a. short runs by monitor in 2021.  Marland Kitchen Short-term memory loss   . Vitamin D deficiency    Past Surgical History:  Procedure Laterality Date  . ABDOMINAL HYSTERECTOMY  1971  . BLADDER SURGERY  2010  . Broken Pelvis  2015  . C-EYE SURGERY  PROCEDURE Bilateral    [facility]  . CATARACT EXTRACTION Bilateral    Done in [place]  . EYE SURGERY Bilateral    Cat Sx  . HIP PINNING,CANNULATED Right 10/07/2013   Procedure: CANNULATED screws right hip;  Surgeon: Mcarthur Rossetti, MD;  Location: [place];  Service: Orthopedics;  Laterality: Right;    FAMILY HISTORY Family History  Problem Relation Age of Onset  . Prostate cancer Father   . Heart attack Sister   . Cancer Brother   . Cancer Sister   . Heart attack Sister   . Cancer Sister   . Stroke Daughter   . Dementia Neg Hx   . Alzheimer's disease Neg Hx     SOCIAL HISTORY Social History   Tobacco Use  . Smoking status: Former Smoker    Types: Cigarettes    Quit date: 08/02/1970    Years since quitting: 48.9  . Smokeless tobacco: Never Used  . Tobacco comment: "smoked very little"  Vaping Use  . Vaping Use: Never used  Substance Use Topics  . Alcohol use: Never    Alcohol/week: 0.0 standard drinks  . Drug use: Never         OPHTHALMIC EXAM:  Base Eye Exam    Visual Acuity (Snellen - Linear)      Right Left   Dist [place] 20/25 -2 20/40   Dist ph [place] NI NI       Tonometry (Tonopen, 2:28 PM)      Right Left   Pressure 14 14       Pupils      Dark Light Shape React APD   Right 3 2 Round Brisk None   Left 3 2 Round Brisk None       Visual Fields (Counting fingers)      Left Right    Full Full       Extraocular Movement      Right Left    Full, Ortho Full, Ortho       Neuro/Psych    Oriented x3: Yes   Mood/Affect: Normal       Dilation    Both eyes: 1.0% Mydriacyl, 2.5% Phenylephrine @ 2:28 PM        Slit Lamp and Fundus Exam    Slit Lamp Exam      Right Left   Lids/Lashes Dermatochalasis - upper lid Dermatochalasis - upper lid   Conjunctiva/Sclera White and quiet White and quiet   Cornea Arcus, 1-2+ Punctate epithelial erosions, mild EBMD and corneal haze Arcus, 2-3+ Punctate epithelial erosions, EBMD, irregular epi   Anterior  Chamber Deep and quiet Deep and quiet   Iris Round and dilated Round and moderately dilated to  62mm   Lens PC IOL in good position with open PC PC IOL in good position with open PC   Vitreous Vitreous syneresis, Posterior vitreous detachment Vitreous syneresis; PVD; vitreous condensations       Fundus Exam      Right Left   Disc Mild temporal Pallor, Peripapillary atrophy, Sharp rim, Compact mild tilt, 2+ temporal Pallor, Peripapillary atrophy, Compact, Sharp rim   C/D Ratio 0.1 0.0   Macula Flat, Blunted foveal reflex, mild Epiretinal membrane, drusen, RPE mottling and clumping, No heme or edema Flat, Blunted foveal reflex, RPE mottling and clumping, Epiretinal membrane, No heme or edema   Vessels Vascular attenuation, Tortuous Vascular attenuation, Tortuous   Periphery bullous IT schisis cavity from 0700-0830 extending posteriorly almost to arcades otherwise attached -- stable from prior, focal pigmented CR scar at 0630 persistent temporal sub-retinal hemorrhage - dark red heme shrinking and turning white; +SRF temporally - stably resolved.  No new heme.          IMAGING AND PROCEDURES  Imaging and Procedures for @TODAY @  OCT, Retina - OU - Both Eyes       Right Eye Quality was good. Central Foveal Thickness: 352. Progression has been stable. Findings include epiretinal membrane, normal foveal contour, no IRF, no SRF, retinal drusen , pigment epithelial detachment (Stable ERM).   Left Eye Quality was good. Central Foveal Thickness: 336. Progression has been stable. Findings include no IRF, epiretinal membrane, retinal drusen , preretinal fibrosis, abnormal foveal contour, no SRF (Stable improvement in SRF -- resolved).   Notes *Images captured and stored on drive  Diagnosis / Impression:  NFP, no IRF/SRF centrally OU OD: Stable ERM & retinal drusen OS: Stable improvement in temporal SRF -- resolved  Clinical management:  See below  Abbreviations: NFP - Normal foveal  profile. CME - cystoid macular edema. PED - pigment epithelial detachment. IRF - intraretinal fluid. SRF - subretinal fluid. EZ - ellipsoid zone. ERM - epiretinal membrane. ORA - outer retinal atrophy. ORT - outer retinal tubulation. SRHM - subretinal hyper-reflective material        Intravitreal Injection, Pharmacologic Agent - OS - Left Eye       Time Out 06/19/2019. 4:20 PM. Confirmed correct patient, procedure, site, and patient consented.   Anesthesia Topical anesthesia was used. Anesthetic medications included Lidocaine 2%, Proparacaine 0.5%.   Procedure Preparation included 5% betadine to ocular surface, eyelid speculum. A (32g) needle was used.   Injection:  2 mg aflibercept 06/21/2019) SOLN   NDC: Gretta Cool, Lot: [id number], Expiration date: 09/30/2019   Route: Intravitreal, Site: Left Eye, Waste: 0.05 mL  Post-op Post injection exam found visual acuity of at least counting fingers. The patient tolerated the procedure well. There were no complications. The patient received written and verbal post procedure care education.                 ASSESSMENT/PLAN:    ICD-10-CM   1. Exudative age-related macular degeneration of left eye with active choroidal neovascularization (HCC)  H35.3221 Intravitreal Injection, Pharmacologic Agent - OS - Left Eye    aflibercept (EYLEA) SOLN 2 mg  2. Subretinal hemorrhage of left eye  H35.62   3. Serous retinal detachment of left eye  H33.22   4. Retinal edema  H35.81 OCT, Retina - OU - Both Eyes  5. 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 Essential hypertension  I10   8. Hypertensive retinopathy of both  eyes  H35.033   9. Pseudophakia of both eyes  Z96.1     1-4. 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 w/ shallow SRF/serous RD  - initially presented with symptomatic floaters OS  - initial exam showed large peripheral temporal subretinal hemorrhage OS -- spanning 2-430 --  also mild vitreous opacities and condensations that were causing symptomatic floaters  - s/p IVA OS #1 (08.05.20) for subretinal hemorrhage, peripheral CNV  - s/p IVE OS #1 (09.02.20) - sample, #2 (10.12.20), #3 (11.11.20), #4 (12.09.20), #5 (1.20.21), #6 (02.17.21), #7 (03.19.21), #8 (04.30.21)  - today BCVA 20/40 OS  - exam shows stable improvement in subretinal heme and shallow SRF  - repeat color Optos image obtained -- peripheral SRH continues to improve  - OCT shows stable improvement in SRF -- resolved  - B-scan (9.18.20) showed hyperreflective, ill-defined mass, 0300 periphery OS  - was seen by Dr. Pearletha FurlMaterin, Ocular Oncologist, on 9.22.2020 -- no tumor on exam or b-scan  - recommend IVE OS #9 today, 06.16.21 -- maintenance w/ extension to 8 weeks  - pt wishes to proceed  - RBA of procedure discussed, questions answered  - informed consent obtained  - Eylea informed consent form signed and scanned on 01.20.2021  - see procedure note  - Eylea4U Benefits investigation initiated 09.02.2020 -- approved for 2021  - f/u in 8 wks, sooner prn, OCT/DFE/optos colors, possible injection  5. Epiretinal membrane, OU.  - mild ERM OU  - asymptomatic, no metamorphopsia  - no indication for surgery at this time  - monitor for now  6. Retinoschisis OD -- stable  - bullous schisis cavity, IT quadrant, from 0700-0830, extends posteriorly almost to arcades  - no associated RT/RD  - stable from prior  - discussed findings and prognosis  - recommend monitoring for now  - if progressing posteriorly, consider intervention / laser retinopexy  7,8. Hypertensive retinopathy OU  - discussed importance of tight BP control  - monitor  9. Pseudophakia OU  - s/p CE/IOL OU  - beautiful surgery, doing well  - monitor   Ophthalmic Meds Ordered this visit:  Meds ordered this encounter  Medications  . aflibercept (EYLEA) SOLN 2 mg       Return in about 8 weeks (around 08/14/2019) for 8 wk f/u for exu  ARMD OS w/DFE/OCT/Optos colors.  There are no Patient Instructions on file for this visit.   Explained the diagnoses, plan, and follow up with the patient and they expressed understanding.  Patient expressed understanding of the importance of proper follow up care.  Karie ChimeraBrian G. Zamarah Ullmer, M.D., Ph.D. Diseases & Surgery of the Retina and Vitreous [facility] 06/19/2019   I have reviewed the above documentation for accuracy and completeness, and I agree with the above. Karie ChimeraBrian G. Porschia Willbanks, M.D., Ph.D. 06/21/19 4:46 PM   Abbreviations: M myopia (nearsighted); A astigmatism; H hyperopia (farsighted); P presbyopia; Mrx spectacle prescription;  CTL contact lenses; OD right eye; OS left eye; OU both eyes  XT exotropia; ET esotropia; PEK punctate epithelial keratitis; PEE punctate epithelial erosions; DES dry eye syndrome; MGD meibomian gland dysfunction; ATs artificial tears; PFAT's preservative free artificial tears; NSC nuclear sclerotic cataract; PSC posterior subcapsular cataract; ERM epi-retinal membrane; PVD posterior vitreous detachment; RD retinal detachment; DM diabetes mellitus; DR diabetic retinopathy; NPDR non-proliferative diabetic retinopathy; PDR proliferative diabetic retinopathy; CSME clinically significant macular edema; DME diabetic macular edema; dbh dot blot hemorrhages; CWS cotton wool spot; POAG primary open angle glaucoma;  C/D cup-to-disc ratio; HVF humphrey visual field; GVF goldmann visual field; OCT optical coherence tomography; IOP intraocular pressure; BRVO Branch retinal vein occlusion; CRVO central retinal vein occlusion; CRAO central retinal artery occlusion; BRAO branch retinal artery occlusion; RT retinal tear; SB scleral buckle; PPV pars plana vitrectomy; VH Vitreous hemorrhage; PRP panretinal laser photocoagulation; IVK intravitreal kenalog; VMT vitreomacular traction; MH Macular hole;  NVD neovascularization of the disc; NVE neovascularization elsewhere;  AREDS age related eye disease study; ARMD age related macular degeneration; POAG primary open angle glaucoma; EBMD epithelial/anterior basement membrane dystrophy; ACIOL anterior chamber intraocular lens; IOL intraocular lens; PCIOL posterior chamber intraocular lens; Phaco/IOL phacoemulsification with intraocular lens placement; PRK photorefractive keratectomy; LASIK laser assisted in situ keratomileusis; HTN hypertension; DM diabetes mellitus; COPD chronic obstructive pulmonary disease

## 2019-06-19 ENCOUNTER — Other Ambulatory Visit: Payer: Self-pay

## 2019-06-19 ENCOUNTER — Ambulatory Visit (INDEPENDENT_AMBULATORY_CARE_PROVIDER_SITE_OTHER): Payer: Medicare Other | Admitting: Ophthalmology

## 2019-06-19 ENCOUNTER — Encounter (INDEPENDENT_AMBULATORY_CARE_PROVIDER_SITE_OTHER): Payer: Self-pay | Admitting: Ophthalmology

## 2019-06-19 DIAGNOSIS — H3562 Retinal hemorrhage, left eye: Secondary | ICD-10-CM | POA: Diagnosis not present

## 2019-06-19 DIAGNOSIS — H33101 Unspecified retinoschisis, right eye: Secondary | ICD-10-CM

## 2019-06-19 DIAGNOSIS — H3581 Retinal edema: Secondary | ICD-10-CM | POA: Diagnosis not present

## 2019-06-19 DIAGNOSIS — Z961 Presence of intraocular lens: Secondary | ICD-10-CM

## 2019-06-19 DIAGNOSIS — H35033 Hypertensive retinopathy, bilateral: Secondary | ICD-10-CM

## 2019-06-19 DIAGNOSIS — H353221 Exudative age-related macular degeneration, left eye, with active choroidal neovascularization: Secondary | ICD-10-CM | POA: Diagnosis not present

## 2019-06-19 DIAGNOSIS — H35373 Puckering of macula, bilateral: Secondary | ICD-10-CM

## 2019-06-19 DIAGNOSIS — H3322 Serous retinal detachment, left eye: Secondary | ICD-10-CM

## 2019-06-19 DIAGNOSIS — I10 Essential (primary) hypertension: Secondary | ICD-10-CM

## 2019-06-20 ENCOUNTER — Encounter (INDEPENDENT_AMBULATORY_CARE_PROVIDER_SITE_OTHER): Payer: Self-pay | Admitting: Ophthalmology

## 2019-06-21 MED ORDER — AFLIBERCEPT 2MG/0.05ML IZ SOLN FOR KALEIDOSCOPE
2.0000 mg | INTRAVITREAL | Status: AC | PRN
  Administered 2019-06-21: 2 mg via INTRAVITREAL

## 2019-08-13 NOTE — Progress Notes (Signed)
[facility] - Clinic Note  08/14/2019     CHIEF COMPLAINT Patient presents for Retina Follow Up   HISTORY OF PRESENT ILLNESS: Tracey Rosario is a 86 y.o. female who presents to the clinic today for:   HPI    Retina Follow Up    Patient presents with  Wet AMD.  In left eye.  Duration of 8 weeks.  I, the attending physician,  performed the HPI with the patient and updated documentation appropriately.          Comments    8 week follow up NVAMD OS-  Vision appears stable OU.  Restasis BID OS.         Last edited by Rennis Chris, MD on 08/14/2019  1:53 PM. (History)    pt states no changes in vision   Referring physician: Dimitri Ped, MD [address] [place],  [place] [address]  HISTORICAL INFORMATION:   Selected notes from the MEDICAL RECORD NUMBER Referred by Dr. Baker Pierini for retinal eval   CURRENT MEDICATIONS: Current Outpatient Medications (Ophthalmic Drugs)  Medication Sig  . polyvinyl alcohol (ARTIFICIAL TEARS) 1.4 % ophthalmic solution Place 1 drop into both eyes as needed for dry eyes.   No current facility-administered medications for this visit. (Ophthalmic Drugs)   Current Outpatient Medications (Other)  Medication Sig  . amLODipine (NORVASC) 5 MG tablet Take 5 mg by mouth daily.  Marland Kitchen aspirin EC 81 MG tablet Take 1 tablet (81 mg total) by mouth daily.  . Cholecalciferol (VITAMIN D3) 125 MCG (5000 UT) TABS Take 5,000 Units by mouth daily.   . citalopram (CELEXA) 20 MG tablet Take 20 mg by mouth daily.  Marland Kitchen donepezil (ARICEPT) 10 MG tablet Take 10 mg by mouth daily.  . ferrous gluconate (FERGON) 324 MG tablet Take 324 mg by mouth daily.  Marland Kitchen levothyroxine (EUTHYROX) 50 MCG tablet Take 50 mcg by mouth daily before breakfast.  . lisinopril (PRINIVIL,ZESTRIL) 30 MG tablet TAKE 1 TABLET DAILY (NEED AN APPOINTMENT)  . meloxicam (MOBIC) 7.5 MG tablet Take 7.5 mg by mouth daily.   . methocarbamol (ROBAXIN) 500 MG tablet  Take 1 tablet (500 mg total) by mouth 2 (two) times daily as needed.  . Multiple Vitamins-Minerals (CENTRUM SILVER) tablet Take 1 tablet by mouth daily.  Marland Kitchen omeprazole (PRILOSEC) 20 MG capsule TAKE 1 CAPSULE DAILY  . Probiotic Product (PROBIOTIC PO) Take 1 tablet by mouth daily.    No current facility-administered medications for this visit. (Other)      REVIEW OF SYSTEMS: ROS    Positive for: Gastrointestinal, Neurological, Musculoskeletal, Cardiovascular, Eyes   Negative for: Constitutional, Skin, Genitourinary, HENT, Endocrine, Respiratory, Psychiatric, Allergic/Imm, Heme/Lymph   Last edited by Joni Reining, COA on 08/14/2019 10:03 AM. (History)       ALLERGIES Allergies  Allergen Reactions  . Codeine Other (See Comments)    Makes her crazy    PAST MEDICAL HISTORY Past Medical History:  Diagnosis Date  . Acid reflux   . Anemia   . Arthropathy   . Bradycardia    beta blocker reduced in 2021 due to this  . CKD (chronic kidney disease), stage III   . Cognitive decline    referred to neurologist  . Depression 10/07/2013   daughter didn't know anything about this  . Dizziness   . Frequent PVCs   . GERD (gastroesophageal reflux disease) 05/04/2013  . Hip fx (HCC)    r hip, bilat pubis rami  .  Hyperlipemia   . Hypertension   . Hypertensive retinopathy    OU  . Hypothyroidism   . Insomnia   . Macular degeneration    Exu OS  . Multilevel degenerative disc disease   . Palpitations 05/04/2013   saw cardiologist in 2015, he deleted A. Fib from the record as per note not documentation to support  . Premature atrial contractions   . PSVT (paroxysmal supraventricular tachycardia) (HCC)    a. short runs by monitor in 2021.  Marland Kitchen Short-term memory loss   . Vitamin D deficiency    Past Surgical History:  Procedure Laterality Date  . ABDOMINAL HYSTERECTOMY  1971  . BLADDER SURGERY  2010  . Broken Pelvis  2015  . C-EYE SURGERY PROCEDURE Bilateral    zaldivar  . CATARACT  EXTRACTION Bilateral    Done in [place]  . EYE SURGERY Bilateral    Cat Sx  . HIP PINNING,CANNULATED Right 10/07/2013   Procedure: CANNULATED screws right hip;  Surgeon: Kathryne Hitch, MD;  Location: [facility] OR;  Service: Orthopedics;  Laterality: Right;    FAMILY HISTORY Family History  Problem Relation Age of Onset  . Prostate cancer Father   . Heart attack Sister   . Cancer Brother   . Cancer Sister   . Heart attack Sister   . Cancer Sister   . Stroke Daughter   . Dementia Neg Hx   . Alzheimer's disease Neg Hx     SOCIAL HISTORY Social History   Tobacco Use  . Smoking status: Former Smoker    Types: Cigarettes    Quit date: 08/02/1970    Years since quitting: 49.0  . Smokeless tobacco: Never Used  . Tobacco comment: "smoked very little"  Vaping Use  . Vaping Use: Never used  Substance Use Topics  . Alcohol use: Never    Alcohol/week: 0.0 standard drinks  . Drug use: Never         OPHTHALMIC EXAM:  Base Eye Exam    Visual Acuity (Snellen - Linear)      Right Left   Dist [place] 20/25 -2 20/50   Dist ph [place] NI 20/40 -2       Tonometry (Tonopen, 10:09 AM)      Right Left   Pressure 14 15       Pupils      Dark Light Shape React APD   Right 3 2 Round Brisk None   Left 3 2 Round Brisk None       Visual Fields (Counting fingers)      Left Right    Full Full       Extraocular Movement      Right Left    Full Full       Neuro/Psych    Oriented x3: Yes   Mood/Affect: Normal       Dilation    Both eyes: 1.0% Mydriacyl, 2.5% Phenylephrine @ 10:09 AM        Slit Lamp and Fundus Exam    Slit Lamp Exam      Right Left   Lids/Lashes Dermatochalasis - upper lid Dermatochalasis - upper lid   Conjunctiva/Sclera White and quiet White and quiet   Cornea Arcus, 1-2+ Punctate epithelial erosions, mild EBMD and corneal haze Arcus, 2-3+ Punctate epithelial erosions, EBMD, irregular epi   Anterior Chamber Deep and quiet Deep and quiet   Iris Round and  dilated Round and moderately dilated to 50mm   Lens PC IOL in good  position with open PC PC IOL in good position with open PC   Vitreous Vitreous syneresis, Posterior vitreous detachment Vitreous syneresis; PVD; vitreous condensations       Fundus Exam      Right Left   Disc Mild temporal Pallor, Peripapillary atrophy, Sharp rim, Compact mild tilt, 2+ temporal Pallor, Peripapillary atrophy, Compact, Sharp rim   C/D Ratio 0.1 0.0   Macula Flat, Blunted foveal reflex, mild Epiretinal membrane, drusen, RPE mottling and clumping, No heme or edema Flat, Blunted foveal reflex, RPE mottling and clumping, Epiretinal membrane, No heme or edema   Vessels Vascular attenuation, Tortuous Vascular attenuation, Tortuous   Periphery bullous IT schisis cavity from 0700-0830 extending posteriorly almost to arcades otherwise attached -- stable from prior, focal pigmented CR scar at 0630 persistent temporal sub-retinal hemorrhage - now white with surrounding pigment clumping;  No SRF -- completely resolved.  No new heme.          IMAGING AND PROCEDURES  Imaging and Procedures for @  OCT, Retina - OU - Both Eyes       Right Eye Quality was good. Central Foveal Thickness: 363. Progression has been stable. Findings include epiretinal membrane, normal foveal contour, no IRF, no SRF, retinal drusen , pigment epithelial detachment (Stable ERM).   Left Eye Quality was good. Central Foveal Thickness: 337. Progression has been stable. Findings include no IRF, epiretinal membrane, retinal drusen , preretinal fibrosis, abnormal foveal contour, no SRF (Stable resolution in SRF).   Notes *Images captured and stored on drive  Diagnosis / Impression:  NFP, no IRF/SRF centrally OU OD: Stable ERM & retinal drusen OS: Stable resolution in SRF  Clinical management:  See below  Abbreviations: NFP - Normal foveal profile. CME - cystoid macular edema. PED - pigment epithelial detachment. IRF - intraretinal  fluid. SRF - subretinal fluid. EZ - ellipsoid zone. ERM - epiretinal membrane. ORA - outer retinal atrophy. ORT - outer retinal tubulation. SRHM - subretinal hyper-reflective material        Color Fundus Photography Optos - OU - Both Eyes       Right Eye Progression has been stable. Disc findings include normal observations. Macula : retinal pigment epithelium abnormalities. Vessels : attenuated, tortuous vessels. Periphery : RPE abnormality (IT schisis -- stable from prior).   Left Eye Progression has been stable. Disc findings include normal observations. Macula : normal observations. Vessels : attenuated. Periphery : RPE abnormality, hemorrhage (improvement in sub retinal hemorrhage temporally (0200) -- SRH now white and pigment clumping surrounding).   Notes **Images stored on drive**  Impression: OD: bullous retinoschisis, IT periphery -- stable from prior OS: PEHCR with improved sub retinal hemorrhage temporally (0200)         Intravitreal Injection, Pharmacologic Agent - OS - Left Eye       Time Out 08/14/2019. 11:46 AM. Confirmed correct patient, procedure, site, and patient consented.   Anesthesia Topical anesthesia was used. Anesthetic medications included Lidocaine 2%, Proparacaine 0.5%.   Procedure Preparation included 5% betadine to ocular surface, eyelid speculum. A (32g) needle was used.   Injection:  2 mg aflibercept Gretta Cool) SOLN   NDC: L6038910, Lot: [id number], Expiration date: 10/04/2019   Route: Intravitreal, Site: Left Eye, Waste: 0.05 mL  Post-op Post injection exam found visual acuity of at least counting fingers. The patient tolerated the procedure well. There were no complications. The patient received written and verbal post procedure care education. Post injection medications were not given.  ASSESSMENT/PLAN:    ICD-10-CM   1. Exudative age-related macular degeneration of left eye with active choroidal  neovascularization (HCC)  H35.3221 Color Fundus Photography Optos - OU - Both Eyes    Intravitreal Injection, Pharmacologic Agent - OS - Left Eye    aflibercept (EYLEA) SOLN 2 mg  2. Subretinal hemorrhage of left eye  H35.62   3. Serous retinal detachment of left eye  H33.22   4. Retinal edema  H35.81 OCT, Retina - OU - Both Eyes  5. 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 Essential hypertension  I10   8. Hypertensive retinopathy of both eyes  H35.033   9. Pseudophakia of both eyes  Z96.1     1-4. 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 w/ shallow SRF/serous RD  - initially presented with symptomatic floaters OS  - initial exam showed large peripheral temporal subretinal hemorrhage OS -- spanning 2-430 -- also mild vitreous opacities and condensations that were causing symptomatic floaters  - s/p IVA OS #1 (08.05.20) for subretinal hemorrhage, peripheral CNV  - s/p IVE OS #1 (09.02.20) - sample, #2 (10.12.20), #3 (11.11.20), #4 (12.09.20), #5 (1.20.21), #6 (02.17.21), #7 (03.19.21), #8 (04.30.21), #9 (06.16.21)  - today BCVA 20/40-2 OS  - exam shows stable improvement in subretinal heme and shallow SRF  - repeat color Optos image obtained -- peripheral SRH continues to improve -- very small, white and with surrounding pigment  - OCT shows stable resolution in SRF  - B-scan (9.18.20) showed hyperreflective, ill-defined mass, 0300 periphery OS  - was seen by Dr. Pearletha FurlMaterin, Ocular Oncologist, on 9.22.2020 -- no tumor on exam or b-scan  - recommend IVE OS #10 today, 08.11.21 -- maintenance w/ extension to 10 weeks  - pt wishes to proceed  - RBA of procedure discussed, questions answered  - informed consent obtained  - Eylea informed consent form signed and scanned on 01.20.2021  - see procedure note  - Eylea4U Benefits investigation initiated 09.02.2020 -- approved for 2021  - f/u in 10 wks, sooner prn, OCT/DFE/optos colors,  possible injection; tx and ext as able  5. Epiretinal membrane, OU.  - mild ERM OU  - asymptomatic, no metamorphopsia  - no indication for surgery at this time  - monitor for now  6. Retinoschisis OD -- stable  - bullous schisis cavity, IT quadrant, from 0700-0830, extends posteriorly almost to arcades  - no associated RT/RD  - stable from prior  - discussed findings and prognosis  - recommend monitoring for now  - if progressing posteriorly, consider laser therapy  7,8. Hypertensive retinopathy OU  - discussed importance of tight BP control  - monitor  9. Pseudophakia OU  - s/p CE/IOL OU  - beautiful surgery, doing well  - monitor   Ophthalmic Meds Ordered this visit:  Meds ordered this encounter  Medications  . aflibercept (EYLEA) SOLN 2 mg       Return in about 10 weeks (around 10/23/2019) for f/u exu ARMD OS, DFE, OCT.  There are no Patient Instructions on file for this visit.   Explained the diagnoses, plan, and follow up with the patient and they expressed understanding.  Patient expressed understanding of the importance of proper follow up care.  This document serves as a record of services personally performed by Karie ChimeraBrian G. Jacson Rapaport, MD, PhD. It was created on their behalf by Annalee Gentaaryl Barber, COMT. The creation of this record is the provider's  dictation and/or activities during the visit.  Electronically signed by: Annalee Genta, COMT 08/14/19 1:57 PM  This document serves as a record of services personally performed by Karie Chimera, MD, PhD. It was created on their behalf by Glee Arvin. Manson Passey, OA an ophthalmic technician. The creation of this record is the provider's dictation and/or activities during the visit.    This document serves as a record of services personally performed by Karie Chimera, MD, PhD. It was created on their behalf by Glee Arvin. Manson Passey, OA an ophthalmic technician. The creation of this record is the provider's dictation and/or activities during  the visit.    Electronically signed by: Glee Arvin. [place], OA 8.11.21 @ 1:57 PM  Karie Chimera, M.D., Ph.D. Diseases & Surgery of the Retina and Vitreous [facility]  I have reviewed the above documentation for accuracy and completeness, and I agree with the above. Karie Chimera, M.D., Ph.D. 08/14/19 1:57 PM    Abbreviations: M myopia (nearsighted); A astigmatism; H hyperopia (farsighted); P presbyopia; Mrx spectacle prescription;  CTL contact lenses; OD right eye; OS left eye; OU both eyes  XT exotropia; ET esotropia; PEK punctate epithelial keratitis; PEE punctate epithelial erosions; DES dry eye syndrome; MGD meibomian gland dysfunction; ATs artificial tears; PFAT's preservative free artificial tears; NSC nuclear sclerotic cataract; PSC posterior subcapsular cataract; ERM epi-retinal membrane; PVD posterior vitreous detachment; RD retinal detachment; DM diabetes mellitus; DR diabetic retinopathy; NPDR non-proliferative diabetic retinopathy; PDR proliferative diabetic retinopathy; CSME clinically significant macular edema; DME diabetic macular edema; dbh dot blot hemorrhages; CWS cotton wool spot; POAG primary open angle glaucoma; C/D cup-to-disc ratio; HVF humphrey visual field; GVF goldmann visual field; OCT optical coherence tomography; IOP intraocular pressure; BRVO Branch retinal vein occlusion; CRVO central retinal vein occlusion; CRAO central retinal artery occlusion; BRAO branch retinal artery occlusion; RT retinal tear; SB scleral buckle; PPV pars plana vitrectomy; VH Vitreous hemorrhage; PRP panretinal laser photocoagulation; IVK intravitreal kenalog; VMT vitreomacular traction; MH Macular hole;  NVD neovascularization of the disc; NVE neovascularization elsewhere; AREDS age related eye disease study; ARMD age related macular degeneration; POAG primary open angle glaucoma; EBMD epithelial/anterior basement membrane dystrophy; ACIOL anterior chamber intraocular lens;  IOL intraocular lens; PCIOL posterior chamber intraocular lens; Phaco/IOL phacoemulsification with intraocular lens placement; PRK photorefractive keratectomy; LASIK laser assisted in situ keratomileusis; HTN hypertension; DM diabetes mellitus; COPD chronic obstructive pulmonary disease

## 2019-08-14 ENCOUNTER — Ambulatory Visit (INDEPENDENT_AMBULATORY_CARE_PROVIDER_SITE_OTHER): Payer: Medicare Other | Admitting: Ophthalmology

## 2019-08-14 ENCOUNTER — Other Ambulatory Visit: Payer: Self-pay

## 2019-08-14 ENCOUNTER — Encounter (INDEPENDENT_AMBULATORY_CARE_PROVIDER_SITE_OTHER): Payer: Self-pay | Admitting: Ophthalmology

## 2019-08-14 DIAGNOSIS — H353221 Exudative age-related macular degeneration, left eye, with active choroidal neovascularization: Secondary | ICD-10-CM

## 2019-08-14 DIAGNOSIS — H35373 Puckering of macula, bilateral: Secondary | ICD-10-CM

## 2019-08-14 DIAGNOSIS — H3581 Retinal edema: Secondary | ICD-10-CM | POA: Diagnosis not present

## 2019-08-14 DIAGNOSIS — Z961 Presence of intraocular lens: Secondary | ICD-10-CM

## 2019-08-14 DIAGNOSIS — I10 Essential (primary) hypertension: Secondary | ICD-10-CM

## 2019-08-14 DIAGNOSIS — H3562 Retinal hemorrhage, left eye: Secondary | ICD-10-CM

## 2019-08-14 DIAGNOSIS — H3322 Serous retinal detachment, left eye: Secondary | ICD-10-CM

## 2019-08-14 DIAGNOSIS — H33101 Unspecified retinoschisis, right eye: Secondary | ICD-10-CM

## 2019-08-14 DIAGNOSIS — H35033 Hypertensive retinopathy, bilateral: Secondary | ICD-10-CM

## 2019-08-14 MED ORDER — AFLIBERCEPT 2MG/0.05ML IZ SOLN FOR KALEIDOSCOPE
2.0000 mg | INTRAVITREAL | Status: AC | PRN
  Administered 2019-08-14: 2 mg via INTRAVITREAL

## 2019-10-22 NOTE — Progress Notes (Signed)
[facility] - Clinic Note  10/23/2019     CHIEF COMPLAINT Patient presents for Retina Follow Up   HISTORY OF PRESENT ILLNESS: Tracey Rosario is a 86 y.o. female who presents to the clinic today for:   HPI    Retina Follow Up    Patient presents with  Wet AMD.  In left eye.  This started weeks ago.  Severity is moderate.  Duration of weeks.  Since onset it is stable.  I, the attending physician,  performed the HPI with the patient and updated documentation appropriately.          Comments    Pt states her vision is the same OU.  Pt denies eye pain or discomfort and denies any new or worsening floaters or fol OU.       Last edited by Rennis Chris, MD on 10/23/2019  1:19 PM. (History)    pt states she has not noticed any change in vision, she denies spikes in BP   Referring physician: Courtney Paris, NP [address] [place],  [place] [address]  HISTORICAL INFORMATION:   Selected notes from the MEDICAL RECORD NUMBER Referred by Dr. Baker Pierini for retinal eval   CURRENT MEDICATIONS: Current Outpatient Medications (Ophthalmic Drugs)  Medication Sig  . polyvinyl alcohol (ARTIFICIAL TEARS) 1.4 % ophthalmic solution Place 1 drop into both eyes as needed for dry eyes.   No current facility-administered medications for this visit. (Ophthalmic Drugs)   Current Outpatient Medications (Other)  Medication Sig  . amLODipine (NORVASC) 5 MG tablet Take 5 mg by mouth daily.  Marland Kitchen aspirin EC 81 MG tablet Take 1 tablet (81 mg total) by mouth daily.  . Cholecalciferol (VITAMIN D3) 125 MCG (5000 UT) TABS Take 5,000 Units by mouth daily.   . citalopram (CELEXA) 20 MG tablet Take 20 mg by mouth daily.  Marland Kitchen donepezil (ARICEPT) 10 MG tablet Take 10 mg by mouth daily.  . ferrous gluconate (FERGON) 324 MG tablet Take 324 mg by mouth daily.  Marland Kitchen levothyroxine (EUTHYROX) 50 MCG tablet Take 50 mcg by mouth daily before breakfast.  . lisinopril (PRINIVIL,ZESTRIL) 30 MG  tablet TAKE 1 TABLET DAILY (NEED AN APPOINTMENT)  . meloxicam (MOBIC) 7.5 MG tablet Take 7.5 mg by mouth daily.   . methocarbamol (ROBAXIN) 500 MG tablet Take 1 tablet (500 mg total) by mouth 2 (two) times daily as needed.  . Multiple Vitamins-Minerals (CENTRUM SILVER) tablet Take 1 tablet by mouth daily.  Marland Kitchen omeprazole (PRILOSEC) 20 MG capsule TAKE 1 CAPSULE DAILY  . Probiotic Product (PROBIOTIC PO) Take 1 tablet by mouth daily.    No current facility-administered medications for this visit. (Other)      REVIEW OF SYSTEMS: ROS    Positive for: Gastrointestinal, Neurological, Musculoskeletal, Cardiovascular, Eyes   Negative for: Constitutional, Skin, Genitourinary, HENT, Endocrine, Respiratory, Psychiatric, Allergic/Imm, Heme/Lymph   Last edited by Corrinne Eagle on 10/23/2019  9:36 AM. (History)       ALLERGIES Allergies  Allergen Reactions  . Codeine Other (See Comments)    Makes her crazy    PAST MEDICAL HISTORY Past Medical History:  Diagnosis Date  . Acid reflux   . Anemia   . Arthropathy   . Bradycardia    beta blocker reduced in 2021 due to this  . CKD (chronic kidney disease), stage III (HCC)   . Cognitive decline    referred to neurologist  . Depression 10/07/2013   daughter didn't know anything about  this  . Dizziness   . Frequent PVCs   . GERD (gastroesophageal reflux disease) 05/04/2013  . Hip fx (HCC)    r hip, bilat pubis rami  . Hyperlipemia   . Hypertension   . Hypertensive retinopathy    OU  . Hypothyroidism   . Insomnia   . Macular degeneration    Exu OS  . Multilevel degenerative disc disease   . Palpitations 05/04/2013   saw cardiologist in 2015, he deleted A. Fib from the record as per note not documentation to support  . Premature atrial contractions   . PSVT (paroxysmal supraventricular tachycardia) (HCC)    a. short runs by monitor in 2021.  Marland Kitchen. Short-term memory loss   . Vitamin D deficiency    Past Surgical History:  Procedure  Laterality Date  . ABDOMINAL HYSTERECTOMY  1971  . BLADDER SURGERY  2010  . Broken Pelvis  2015  . C-EYE SURGERY PROCEDURE Bilateral    zaldivar  . CATARACT EXTRACTION Bilateral    Done in [place]  . EYE SURGERY Bilateral    Cat Sx  . HIP PINNING,CANNULATED Right 10/07/2013   Procedure: CANNULATED screws right hip;  Surgeon: Kathryne Hitchhristopher Y Blackman, MD;  [facility];  Service: Orthopedics;  Laterality: Right;    FAMILY HISTORY Family History  Problem Relation Age of Onset  . Prostate cancer Father   . Heart attack Sister   . Cancer Brother   . Cancer Sister   . Heart attack Sister   . Cancer Sister   . Stroke Daughter   . Dementia Neg Hx   . Alzheimer's disease Neg Hx     SOCIAL HISTORY Social History   Tobacco Use  . Smoking status: Former Smoker    Types: Cigarettes    Quit date: 08/02/1970    Years since quitting: 49.2  . Smokeless tobacco: Never Used  . Tobacco comment: "smoked very little"  Vaping Use  . Vaping Use: Never used  Substance Use Topics  . Alcohol use: Never    Alcohol/week: 0.0 standard drinks  . Drug use: Never         OPHTHALMIC EXAM:  Base Eye Exam    Visual Acuity (Snellen - Linear)      Right Left   Dist [place] 20/25 -2 20/70 -2   Dist ph [place] NI 20/40 -2       Tonometry (Tonopen, 9:43 AM)      Right Left   Pressure 12 11       Pupils      Dark Light Shape React APD   Right 3 2 Round Minimal 0   Left 3 2 Round Minimal 0       Visual Fields      Left Right    Full Full       Extraocular Movement      Right Left    Full Full       Neuro/Psych    Oriented x3: Yes   Mood/Affect: Normal       Dilation    Both eyes: 1.0% Mydriacyl, 2.5% Phenylephrine @ 9:43 AM        Slit Lamp and Fundus Exam    Slit Lamp Exam      Right Left   Lids/Lashes Dermatochalasis - upper lid Dermatochalasis - upper lid   Conjunctiva/Sclera White and quiet White and quiet   Cornea Arcus, 1-2+ Punctate epithelial erosions, mild EBMD and  corneal haze Arcus, 2-3+ Punctate epithelial erosions, EBMD,  irregular epi   Anterior Chamber Deep and quiet Deep and quiet   Iris Round and dilated Round and moderately dilated to 62mm   Lens PC IOL in good position with open PC PC IOL in good position with open PC   Vitreous Vitreous syneresis, Posterior vitreous detachment Vitreous syneresis; PVD; vitreous condensations       Fundus Exam      Right Left   Disc Mild temporal Pallor, Peripapillary atrophy, Sharp rim, Compact mild tilt, 2+ temporal Pallor, Peripapillary atrophy, Compact, Sharp rim   C/D Ratio 0.1 0.0   Macula Flat, Blunted foveal reflex, mild Epiretinal membrane, drusen, RPE mottling and clumping, No heme or edema Flat, Blunted foveal reflex, RPE mottling and clumping, Epiretinal membrane, mild central cystic changes, No heme    Vessels Vascular attenuation, Tortuous Vascular attenuation, Tortuous   Periphery bullous IT schisis cavity from 0700-0830 extending posteriorly almost to arcades otherwise attached -- stable from prior, focal pigmented CR scar at 0630 persistent temporal sub-retinal hemorrhage - now white with surrounding pigment clumping;  No SRF -- completely resolved.  No new heme, shallow schisis IT quad          IMAGING AND PROCEDURES  Imaging and Procedures for @TODAY @  OCT, Retina - OU - Both Eyes       Right Eye Quality was good. Central Foveal Thickness: 348. Progression has been stable. Findings include epiretinal membrane, normal foveal contour, no IRF, no SRF, retinal drusen , pigment epithelial detachment, macular pucker (Stable ERM; bullous schisis IT quad caught on widefield).   Left Eye Quality was good. Central Foveal Thickness: 372. Progression has worsened. Findings include epiretinal membrane, retinal drusen , preretinal fibrosis, abnormal foveal contour, no SRF, intraretinal fluid (Interval development of central IRF/cystic changes; IT schisis caught on widefield).   Notes *Images  captured and stored on drive  Diagnosis / Impression:  OD: Stable ERM & retinal drusen OS: Interval development of central IRF/cystic changes  Clinical management:  See below  Abbreviations: NFP - Normal foveal profile. CME - cystoid macular edema. PED - pigment epithelial detachment. IRF - intraretinal fluid. SRF - subretinal fluid. EZ - ellipsoid zone. ERM - epiretinal membrane. ORA - outer retinal atrophy. ORT - outer retinal tubulation. SRHM - subretinal hyper-reflective material        Intravitreal Injection, Pharmacologic Agent - OS - Left Eye       Time Out 10/23/2019. 10:35 AM. Confirmed correct patient, procedure, site, and patient consented.   Anesthesia Topical anesthesia was used. Anesthetic medications included Lidocaine 2%, Proparacaine 0.5%.   Procedure Preparation included 5% betadine to ocular surface, eyelid speculum. A (32g) needle was used.   Injection:  2 mg aflibercept 10/25/2019) SOLN   NDC: Gretta Cool, Lot: [id number], Expiration date: 01/04/2020   Route: Intravitreal, Site: Left Eye, Waste: 0.05 mL  Post-op Post injection exam found visual acuity of at least counting fingers. The patient tolerated the procedure well. There were no complications. The patient received written and verbal post procedure care education. Post injection medications were not given.        Color Fundus Photography Optos - OU - Both Eyes       Right Eye Progression has been stable. Disc findings include normal observations. Macula : retinal pigment epithelium abnormalities. Vessels : attenuated, tortuous vessels. Periphery : RPE abnormality (IT schisis -- stable from prior).   Left Eye Progression has been stable. Disc findings include normal observations. Macula : normal observations. Vessels : attenuated. Periphery : RPE  abnormality, hemorrhage (improvement in sub retinal hemorrhage temporally (0200) -- SRH now white and pigment clumping surrounding).   Notes **Images  stored on drive**  Impression: OD: bullous retinoschisis, IT periphery -- stable from prior OS: PEHCR with stably improved sub retinal hemorrhage temporally (0200)                  ASSESSMENT/PLAN:    ICD-10-CM   1. Exudative age-related macular degeneration of left eye with active choroidal neovascularization (HCC)  H35.3221 Intravitreal Injection, Pharmacologic Agent - OS - Left Eye    aflibercept (EYLEA) SOLN 2 mg  2. Subretinal hemorrhage of left eye  H35.62 Color Fundus Photography Optos - OU - Both Eyes  3. Serous retinal detachment of left eye  H33.22   4. Retinal edema  H35.81 OCT, Retina - OU - Both Eyes  5. 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 Essential hypertension  I10   8. Hypertensive retinopathy of both eyes  H35.033   9. Pseudophakia of both eyes  Z96.1     1-4. 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 w/ shallow SRF/serous RD  - initially presented with symptomatic floaters OS  - initial exam showed large peripheral temporal subretinal hemorrhage OS -- spanning 2-430 -- also mild vitreous opacities and condensations that were causing symptomatic floaters  - s/p IVA OS #1 (08.05.20) for subretinal hemorrhage, peripheral CNV  - s/p IVE OS #1 (09.02.20) - sample, #2 (10.12.20), #3 (11.11.20), #4 (12.09.20), #5 (1.20.21), #6 (02.17.21), #7 (03.19.21), #8 (04.30.21), #9 (06.16.21)  - today BCVA 20/40-2 OS  - exam shows stable improvement in subretinal heme and shallow SRF  - repeat color Optos image obtained -- peripheral SRH continues to improve -- very small, white and with surrounding pigment  - OCT shows interval development of central IRF/cystic changes  - B-scan (9.18.20) showed hyperreflective, ill-defined mass, 0300 periphery OS  - was seen by Dr. Pearletha Furl, Ocular Oncologist, on 9.22.2020 -- no tumor on exam or b-scan  - recommend IVE OS #10 today,  08.11.21 -- with interval back to 6 weeks due to new IRF  - pt wishes to proceed  - RBA of procedure discussed, questions answered  - informed consent obtained  - Eylea informed consent form signed and scanned on 01.20.2021  - see procedure note  - Eylea4U Benefits investigation initiated 09.02.2020 -- approved for 2021  - f/u in 6 wks, sooner prn, OCT/DFE/optos colors, possible injection;   5. Epiretinal membrane, OU.  - mild ERM OU  - asymptomatic, no metamorphopsia  - no indication for surgery at this time  - monitor for now  6. Retinoschisis OD -- stable  - bullous schisis cavity, IT quadrant, from 0700-0830, extends posteriorly almost to arcades  - no associated RT/RD  - stable from prior  - discussed findings and prognosis  - recommend monitoring for now  - if progressing posteriorly, consider intervention  7,8. Hypertensive retinopathy OU  - discussed importance of tight BP control  - monitor  9. Pseudophakia OU  - s/p CE/IOL OU  - beautiful surgery, doing well  - monitor   Ophthalmic Meds Ordered this visit:  Meds ordered this encounter  Medications  . aflibercept (EYLEA) SOLN 2 mg       Return in about 6 weeks (around 12/04/2019) for f/u exu ARMD OS, DFE, OCT.  There are no Patient Instructions on  file for this visit.   Explained the diagnoses, plan, and follow up with the patient and they expressed understanding.  Patient expressed understanding of the importance of proper follow up care.  This document serves as a record of services personally performed by Karie Chimera, MD, PhD. It was created on their behalf by Annalee Genta, COMT. The creation of this record is the provider's dictation and/or activities during the visit.  Electronically signed by: Annalee Genta, COMT 10/23/19 1:24 PM   This document serves as a record of services personally performed by Karie Chimera, MD, PhD. It was created on their behalf by Glee Arvin. Manson Passey, OA an ophthalmic  technician. The creation of this record is the provider's dictation and/or activities during the visit.    Electronically signed by: Glee Arvin. [place], [place] 10.20.2021 1:24 PM  Karie Chimera, M.D., Ph.D. Diseases & Surgery of the Retina and Vitreous [facility]  I have reviewed the above documentation for accuracy and completeness, and I agree with the above. Karie Chimera, M.D., Ph.D. 10/23/19 1:24 PM  Abbreviations: M myopia (nearsighted); A astigmatism; H hyperopia (farsighted); P presbyopia; Mrx spectacle prescription;  CTL contact lenses; OD right eye; OS left eye; OU both eyes  XT exotropia; ET esotropia; PEK punctate epithelial keratitis; PEE punctate epithelial erosions; DES dry eye syndrome; MGD meibomian gland dysfunction; ATs artificial tears; PFAT's preservative free artificial tears; NSC nuclear sclerotic cataract; PSC posterior subcapsular cataract; ERM epi-retinal membrane; PVD posterior vitreous detachment; RD retinal detachment; DM diabetes mellitus; DR diabetic retinopathy; NPDR non-proliferative diabetic retinopathy; PDR proliferative diabetic retinopathy; CSME clinically significant macular edema; DME diabetic macular edema; dbh dot blot hemorrhages; CWS cotton wool spot; POAG primary open angle glaucoma; C/D cup-to-disc ratio; HVF humphrey visual field; GVF goldmann visual field; OCT optical coherence tomography; IOP intraocular pressure; BRVO Branch retinal vein occlusion; CRVO central retinal vein occlusion; CRAO central retinal artery occlusion; BRAO branch retinal artery occlusion; RT retinal tear; SB scleral buckle; PPV pars plana vitrectomy; VH Vitreous hemorrhage; PRP panretinal laser photocoagulation; IVK intravitreal kenalog; VMT vitreomacular traction; MH Macular hole;  NVD neovascularization of the disc; NVE neovascularization elsewhere; AREDS age related eye disease study; ARMD age related macular degeneration; POAG primary open angle glaucoma; EBMD  epithelial/anterior basement membrane dystrophy; ACIOL anterior chamber intraocular lens; IOL intraocular lens; PCIOL posterior chamber intraocular lens; Phaco/IOL phacoemulsification with intraocular lens placement; PRK photorefractive keratectomy; LASIK laser assisted in situ keratomileusis; HTN hypertension; DM diabetes mellitus; COPD chronic obstructive pulmonary disease

## 2019-10-23 ENCOUNTER — Other Ambulatory Visit: Payer: Self-pay

## 2019-10-23 ENCOUNTER — Ambulatory Visit (INDEPENDENT_AMBULATORY_CARE_PROVIDER_SITE_OTHER): Payer: Medicare Other | Admitting: Ophthalmology

## 2019-10-23 ENCOUNTER — Encounter (INDEPENDENT_AMBULATORY_CARE_PROVIDER_SITE_OTHER): Payer: Self-pay | Admitting: Ophthalmology

## 2019-10-23 DIAGNOSIS — H3562 Retinal hemorrhage, left eye: Secondary | ICD-10-CM | POA: Diagnosis not present

## 2019-10-23 DIAGNOSIS — H35033 Hypertensive retinopathy, bilateral: Secondary | ICD-10-CM

## 2019-10-23 DIAGNOSIS — H3322 Serous retinal detachment, left eye: Secondary | ICD-10-CM | POA: Diagnosis not present

## 2019-10-23 DIAGNOSIS — I10 Essential (primary) hypertension: Secondary | ICD-10-CM

## 2019-10-23 DIAGNOSIS — H33101 Unspecified retinoschisis, right eye: Secondary | ICD-10-CM

## 2019-10-23 DIAGNOSIS — H3581 Retinal edema: Secondary | ICD-10-CM | POA: Diagnosis not present

## 2019-10-23 DIAGNOSIS — H35373 Puckering of macula, bilateral: Secondary | ICD-10-CM

## 2019-10-23 DIAGNOSIS — H353221 Exudative age-related macular degeneration, left eye, with active choroidal neovascularization: Secondary | ICD-10-CM

## 2019-10-23 DIAGNOSIS — Z961 Presence of intraocular lens: Secondary | ICD-10-CM

## 2019-10-23 MED ORDER — AFLIBERCEPT 2MG/0.05ML IZ SOLN FOR KALEIDOSCOPE
2.0000 mg | INTRAVITREAL | Status: AC | PRN
  Administered 2019-10-23: 2 mg via INTRAVITREAL

## 2019-11-06 ENCOUNTER — Other Ambulatory Visit: Payer: Self-pay | Admitting: Physician Assistant

## 2019-11-30 NOTE — Progress Notes (Signed)
[facility] - Clinic Note  12/04/2019     CHIEF COMPLAINT Patient presents for Retina Follow Up   HISTORY OF PRESENT ILLNESS: Tracey Rosario is a 86 y.o. female who presents to the clinic today for:   HPI    Retina Follow Up    Patient presents with  Wet AMD.  In left eye.  This started 6 weeks ago.  I, the attending physician,  performed the HPI with the patient and updated documentation appropriately.          Comments    Patient here for 6 weeks retina follow up for exu ARMD OS. Patient states vision OS is a little blurry. No eye pain.        Last edited by Rennis Chris, MD on 12/04/2019  5:20 PM. (History)    pt states she has not noticed any change in vision, she denies spikes in BP   Referring physician: Courtney Paris, NP [address] [place],  [place] [address]  HISTORICAL INFORMATION:   Selected notes from the MEDICAL RECORD NUMBER Referred by Dr. Baker Pierini for retinal eval   CURRENT MEDICATIONS: Current Outpatient Medications (Ophthalmic Drugs)  Medication Sig  . polyvinyl alcohol (ARTIFICIAL TEARS) 1.4 % ophthalmic solution Place 1 drop into both eyes as needed for dry eyes.   No current facility-administered medications for this visit. (Ophthalmic Drugs)   Current Outpatient Medications (Other)  Medication Sig  . amLODipine (NORVASC) 5 MG tablet Take 5 mg by mouth daily.  Marland Kitchen aspirin EC 81 MG tablet Take 1 tablet (81 mg total) by mouth daily.  . Cholecalciferol (VITAMIN D3) 125 MCG (5000 UT) TABS Take 5,000 Units by mouth daily.   . citalopram (CELEXA) 20 MG tablet Take 20 mg by mouth daily.  Marland Kitchen donepezil (ARICEPT) 10 MG tablet Take 10 mg by mouth daily.  . ferrous gluconate (FERGON) 324 MG tablet Take 324 mg by mouth daily.  Marland Kitchen levothyroxine (EUTHYROX) 50 MCG tablet Take 50 mcg by mouth daily before breakfast.  . lisinopril (PRINIVIL,ZESTRIL) 30 MG tablet TAKE 1 TABLET DAILY (NEED AN APPOINTMENT)  . meloxicam (MOBIC) 7.5  MG tablet Take 7.5 mg by mouth daily.   . methocarbamol (ROBAXIN) 500 MG tablet Take 1 tablet (500 mg total) by mouth 2 (two) times daily as needed.  . Multiple Vitamins-Minerals (CENTRUM SILVER) tablet Take 1 tablet by mouth daily.  Marland Kitchen omeprazole (PRILOSEC) 20 MG capsule TAKE 1 CAPSULE DAILY  . Probiotic Product (PROBIOTIC PO) Take 1 tablet by mouth daily.    No current facility-administered medications for this visit. (Other)      REVIEW OF SYSTEMS: ROS    Positive for: Gastrointestinal, Neurological, Musculoskeletal, Cardiovascular, Eyes   Negative for: Constitutional, Skin, Genitourinary, HENT, Endocrine, Respiratory, Psychiatric, Allergic/Imm, Heme/Lymph   Last edited by Laddie Aquas, COA on 12/04/2019  1:45 PM. (History)       ALLERGIES Allergies  Allergen Reactions  . Codeine Other (See Comments)    Makes her crazy    PAST MEDICAL HISTORY Past Medical History:  Diagnosis Date  . Acid reflux   . Anemia   . Arthropathy   . Bradycardia    beta blocker reduced in 2021 due to this  . CKD (chronic kidney disease), stage III (HCC)   . Cognitive decline    referred to neurologist  . Depression 10/07/2013   daughter didn't know anything about this  . Dizziness   . Frequent PVCs   . GERD (  gastroesophageal reflux disease) 05/04/2013  . Hip fx (HCC)    r hip, bilat pubis rami  . Hyperlipemia   . Hypertension   . Hypertensive retinopathy    OU  . Hypothyroidism   . Insomnia   . Macular degeneration    Exu OS  . Multilevel degenerative disc disease   . Palpitations 05/04/2013   saw cardiologist in 2015, he deleted A. Fib from the record as per note not documentation to support  . Premature atrial contractions   . PSVT (paroxysmal supraventricular tachycardia) (HCC)    a. short runs by monitor in 2021.  Marland Kitchen Short-term memory loss   . Vitamin D deficiency    Past Surgical History:  Procedure Laterality Date  . ABDOMINAL HYSTERECTOMY  1971  . BLADDER SURGERY  2010   . Broken Pelvis  2015  . C-EYE SURGERY PROCEDURE Bilateral    zaldivar  . CATARACT EXTRACTION Bilateral    Done in [place]  . EYE SURGERY Bilateral    Cat Sx  . HIP PINNING,CANNULATED Right 10/07/2013   Procedure: CANNULATED screws right hip;  Surgeon: Kathryne Hitch, MD;  Location: [facility] OR;  Service: Orthopedics;  Laterality: Right;    FAMILY HISTORY Family History  Problem Relation Age of Onset  . Prostate cancer Father   . Heart attack Sister   . Cancer Brother   . Cancer Sister   . Heart attack Sister   . Cancer Sister   . Stroke Daughter   . Dementia Neg Hx   . Alzheimer's disease Neg Hx     SOCIAL HISTORY Social History   Tobacco Use  . Smoking status: Former Smoker    Types: Cigarettes    Quit date: 08/02/1970    Years since quitting: 49.3  . Smokeless tobacco: Never Used  . Tobacco comment: "smoked very little"  Vaping Use  . Vaping Use: Never used  Substance Use Topics  . Alcohol use: Never    Alcohol/week: 0.0 standard drinks  . Drug use: Never         OPHTHALMIC EXAM:  Base Eye Exam    Visual Acuity (Snellen - Linear)      Right Left   Dist [place] 20/25 -1 20/60 -1   Dist ph [place] NI 20/40 +1       Tonometry (Tonopen, 1:42 PM)      Right Left   Pressure 13 15       Pupils      Dark Light Shape React APD   Right 3 2 Round Brisk None   Left 3 2 Round Brisk None       Visual Fields (Counting fingers)      Left Right    Full Full       Extraocular Movement      Right Left    Full Full       Neuro/Psych    Oriented x3: Yes   Mood/Affect: Normal       Dilation    Both eyes: 1.0% Mydriacyl, 2.5% Phenylephrine @ 1:42 PM        Slit Lamp and Fundus Exam    Slit Lamp Exam      Right Left   Lids/Lashes Dermatochalasis - upper lid Dermatochalasis - upper lid   Conjunctiva/Sclera White and quiet White and quiet   Cornea Arcus, 1-2+ Punctate epithelial erosions, mild EBMD and corneal haze Arcus, 2-3+ Punctate epithelial erosions,  EBMD, irregular epi   Anterior Chamber Deep and quiet Deep and  quiet   Iris Round and dilated Round and moderately dilated to 5mm   Lens PC IOL in good position with open PC PC IOL in good position with open PC   Vitreous Vitreous syneresis, Posterior vitreous detachment Vitreous syneresis; PVD; vitreous condensations       Fundus Exam      Right Left   Disc Mild temporal Pallor, Peripapillary atrophy, Sharp rim, Compact mild tilt, 2+ temporal Pallor, Peripapillary atrophy, Compact, Sharp rim   C/D Ratio 0.1 0.0   Macula Flat, Blunted foveal reflex, mild Epiretinal membrane, drusen, RPE mottling and clumping, No heme or edema Flat, Blunted foveal reflex, RPE mottling and clumping, Epiretinal membrane, mild central cystic changes - improved, No heme    Vessels Vascular attenuation, Tortuous Vascular attenuation, Tortuous   Periphery bullous IT schisis cavity from 0700-0830 extending posteriorly almost to arcades otherwise attached -- appears slightly less bullous, focal pigmented CR scar at 0630 persistent temporal sub-retinal hemorrhage - now white with surrounding pigment clumping;  No SRF -- completely resolved.  No new heme, shallow schisis IT quad          IMAGING AND PROCEDURES  Imaging and Procedures for @TODAY @  OCT, Retina - OU - Both Eyes       Right Eye Quality was good. Central Foveal Thickness: 351. Progression has been stable. Findings include epiretinal membrane, normal foveal contour, no IRF, no SRF, retinal drusen , pigment epithelial detachment, macular pucker (Stable ERM; bullous schisis IT quad caught on widefield).   Left Eye Quality was good. Central Foveal Thickness: 330. Progression has improved. Findings include epiretinal membrane, retinal drusen , preretinal fibrosis, abnormal foveal contour, no SRF, intraretinal fluid (Interval improvement in cystic changes; IT schisis caught on widefield).   Notes *Images captured and stored on drive  Diagnosis /  Impression:  OD: Stable ERM & retinal drusen OS: Interval improvement in central IRF/cystic changes  Clinical management:  See below  Abbreviations: NFP - Normal foveal profile. CME - cystoid macular edema. PED - pigment epithelial detachment. IRF - intraretinal fluid. SRF - subretinal fluid. EZ - ellipsoid zone. ERM - epiretinal membrane. ORA - outer retinal atrophy. ORT - outer retinal tubulation. SRHM - subretinal hyper-reflective material        Intravitreal Injection, Pharmacologic Agent - OS - Left Eye       Time Out 12/04/2019. 2:43 PM. Confirmed correct patient, procedure, site, and patient consented.   Anesthesia Topical anesthesia was used. Anesthetic medications included Lidocaine 2%, Proparacaine 0.5%.   Procedure Preparation included 5% betadine to ocular surface, eyelid speculum. A (32g) needle was used.   Injection:  2 mg aflibercept Gretta Cool(EYLEA) SOLN   NDC: [id number], Lot: [id number], Expiration date: 02/04/2020   Route: Intravitreal, Site: Left Eye, Waste: 0.05 mL  Post-op Post injection exam found visual acuity of at least counting fingers. The patient tolerated the procedure well. There were no complications. The patient received written and verbal post procedure care education. Post injection medications were not given.                 ASSESSMENT/PLAN:    ICD-10-CM   1. Exudative age-related macular degeneration of left eye with active choroidal neovascularization (HCC)  H35.3221 Intravitreal Injection, Pharmacologic Agent - OS - Left Eye    aflibercept (EYLEA) SOLN 2 mg    aflibercept (EYLEA) SOLN 2 mg  2. Subretinal hemorrhage of left eye  H35.62   3. Serous retinal detachment of left eye  H33.22   4.  Retinal edema  H35.81 OCT, Retina - OU - Both Eyes  5. 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 Essential hypertension  I10   8. Hypertensive retinopathy of both eyes  H35.033   9. Pseudophakia of both  eyes  Z96.1     1-4. 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 w/ shallow SRF/serous RD  - initially presented with symptomatic floaters OS  - initial exam showed large peripheral temporal subretinal hemorrhage OS -- spanning 2-430 -- also mild vitreous opacities and condensations that were causing symptomatic floaters  - s/p IVA OS #1 (08.05.20) for subretinal hemorrhage, peripheral CNV  - s/p IVE OS #1 (09.02.20) - sample, #2 (10.12.20), #3 (11.11.20), #4 (12.09.20), #5 (1.20.21), #6 (02.17.21), #7 (03.19.21), #8 (04.30.21), #9 (06.16.21), #10 (08.11.21)  - today BCVA 20/40-2 OS  - exam shows stable improvement in subretinal heme and shallow SRF  - repeat color Optos image obtained -- peripheral SRH continues to improve -- very small, white and with surrounding pigment  - OCT shows interval improvement in central IRF/cystic changes OS  - B-scan (9.18.20) showed hyperreflective, ill-defined mass, 0300 periphery OS  - was seen by Dr. Pearletha Furl, Ocular Oncologist, on 9.22.2020 -- no tumor on exam or b-scan  - recommend IVE OS #11 today, 12.01.21 -- maintenance w/ f/u in 6-7 wks  - pt wishes to proceed  - RBA of procedure discussed, questions answered  - informed consent obtained  - Eylea informed consent form signed and scanned on 01.20.2021  - see procedure note  - Eylea4U Benefits investigation initiated 09.02.2020 -- approved for 2021  - f/u in 6-7 wks, sooner prn, OCT/DFE/optos colors, possible injection  5. Epiretinal membrane, OU.  - mild ERM OU  - asymptomatic, no metamorphopsia  - no indication for surgery at this time  - monitor for now  6. Retinoschisis OD -- stable  - bullous schisis cavity, IT quadrant, from 0700-0830, extends posteriorly almost to arcades  - no associated RT/RD  - stable from prior  - discussed findings and prognosis  - recommend monitoring for now  - if progressing posteriorly, consider intervention  7,8. Hypertensive retinopathy OU  -  discussed importance of tight BP control  - monitor  9. Pseudophakia OU  - s/p CE/IOL OU  - beautiful surgery, doing well  - monitor   Ophthalmic Meds Ordered this visit:  Meds ordered this encounter  Medications  . aflibercept (EYLEA) SOLN 2 mg  . aflibercept (EYLEA) SOLN 2 mg       Return for f/u 6-7 weeks, exu ARMD OS, DFE, OCT.  There are no Patient Instructions on file for this visit.   Explained the diagnoses, plan, and follow up with the patient and they expressed understanding.  Patient expressed understanding of the importance of proper follow up care.  This document serves as a record of services personally performed by Karie Chimera, MD, PhD. It was created on their behalf by Annalee Genta, COMT. The creation of this record is the provider's dictation and/or activities during the visit.  Electronically signed by: Annalee Genta, COMT 12/04/19 5:43 PM   This document serves as a record of services personally performed by Karie Chimera, MD, PhD. It was created on their behalf by Glee Arvin. Manson Passey, OA an ophthalmic technician. The creation of this record is the provider's dictation and/or activities during the visit.    Electronically signed by: Glee Arvin. Manson Passey, OA  12.01.2021 5:43 PM   Karie Chimera, M.D., Ph.D. Diseases & Surgery of the Retina and Vitreous [facility]  I have reviewed the above documentation for accuracy and completeness, and I agree with the above. Karie Chimera, M.D., Ph.D. 12/04/19 5:43 PM   Abbreviations: M myopia (nearsighted); A astigmatism; H hyperopia (farsighted); P presbyopia; Mrx spectacle prescription;  CTL contact lenses; OD right eye; OS left eye; OU both eyes  XT exotropia; ET esotropia; PEK punctate epithelial keratitis; PEE punctate epithelial erosions; DES dry eye syndrome; MGD meibomian gland dysfunction; ATs artificial tears; PFAT's preservative free artificial tears; NSC nuclear sclerotic cataract; PSC  posterior subcapsular cataract; ERM epi-retinal membrane; PVD posterior vitreous detachment; RD retinal detachment; DM diabetes mellitus; DR diabetic retinopathy; NPDR non-proliferative diabetic retinopathy; PDR proliferative diabetic retinopathy; CSME clinically significant macular edema; DME diabetic macular edema; dbh dot blot hemorrhages; CWS cotton wool spot; POAG primary open angle glaucoma; C/D cup-to-disc ratio; HVF humphrey visual field; GVF goldmann visual field; OCT optical coherence tomography; IOP intraocular pressure; BRVO Branch retinal vein occlusion; CRVO central retinal vein occlusion; CRAO central retinal artery occlusion; BRAO branch retinal artery occlusion; RT retinal tear; SB scleral buckle; PPV pars plana vitrectomy; VH Vitreous hemorrhage; PRP panretinal laser photocoagulation; IVK intravitreal kenalog; VMT vitreomacular traction; MH Macular hole;  NVD neovascularization of the disc; NVE neovascularization elsewhere; AREDS age related eye disease study; ARMD age related macular degeneration; POAG primary open angle glaucoma; EBMD epithelial/anterior basement membrane dystrophy; ACIOL anterior chamber intraocular lens; IOL intraocular lens; PCIOL posterior chamber intraocular lens; Phaco/IOL phacoemulsification with intraocular lens placement; PRK photorefractive keratectomy; LASIK laser assisted in situ keratomileusis; HTN hypertension; DM diabetes mellitus; COPD chronic obstructive pulmonary disease

## 2019-12-04 ENCOUNTER — Ambulatory Visit (INDEPENDENT_AMBULATORY_CARE_PROVIDER_SITE_OTHER): Payer: Medicare Other | Admitting: Ophthalmology

## 2019-12-04 ENCOUNTER — Other Ambulatory Visit: Payer: Self-pay

## 2019-12-04 ENCOUNTER — Encounter (INDEPENDENT_AMBULATORY_CARE_PROVIDER_SITE_OTHER): Payer: Self-pay | Admitting: Ophthalmology

## 2019-12-04 DIAGNOSIS — Z961 Presence of intraocular lens: Secondary | ICD-10-CM

## 2019-12-04 DIAGNOSIS — H35033 Hypertensive retinopathy, bilateral: Secondary | ICD-10-CM

## 2019-12-04 DIAGNOSIS — I10 Essential (primary) hypertension: Secondary | ICD-10-CM

## 2019-12-04 DIAGNOSIS — H353221 Exudative age-related macular degeneration, left eye, with active choroidal neovascularization: Secondary | ICD-10-CM | POA: Diagnosis not present

## 2019-12-04 DIAGNOSIS — H3562 Retinal hemorrhage, left eye: Secondary | ICD-10-CM

## 2019-12-04 DIAGNOSIS — H3581 Retinal edema: Secondary | ICD-10-CM | POA: Diagnosis not present

## 2019-12-04 DIAGNOSIS — H33101 Unspecified retinoschisis, right eye: Secondary | ICD-10-CM

## 2019-12-04 DIAGNOSIS — H3322 Serous retinal detachment, left eye: Secondary | ICD-10-CM | POA: Diagnosis not present

## 2019-12-04 DIAGNOSIS — H35373 Puckering of macula, bilateral: Secondary | ICD-10-CM

## 2019-12-04 MED ORDER — AFLIBERCEPT 2MG/0.05ML IZ SOLN FOR KALEIDOSCOPE
2.0000 mg | INTRAVITREAL | Status: AC | PRN
  Administered 2019-12-04: 2 mg via INTRAVITREAL

## 2020-01-21 NOTE — Progress Notes (Shared)
[facility] - Clinic Note  01/22/2020     CHIEF COMPLAINT Patient presents for No chief complaint on file.   HISTORY OF PRESENT ILLNESS: Tracey Rosario is a 87 y.o. female who presents to the clinic today for:   pt states she has not noticed any change in vision, she denies spikes in BP   Referring physician: Courtney ParisMcCoy, Rachel, NP [address] [place] [id number]  HISTORICAL INFORMATION:   Selected notes from the MEDICAL RECORD NUMBER Referred by Dr. Baker Pierinihris Weaver for retinal eval   CURRENT MEDICATIONS: Current Outpatient Medications (Ophthalmic Drugs)  Medication Sig  . polyvinyl alcohol (ARTIFICIAL TEARS) 1.4 % ophthalmic solution Place 1 drop into both eyes as needed for dry eyes.   No current facility-administered medications for this visit. (Ophthalmic Drugs)   Current Outpatient Medications (Other)  Medication Sig  . amLODipine (NORVASC) 5 MG tablet Take 5 mg by mouth daily.  Marland Kitchen. aspirin EC 81 MG tablet Take 1 tablet (81 mg total) by mouth daily.  . Cholecalciferol (VITAMIN D3) 125 MCG (5000 UT) TABS Take 5,000 Units by mouth daily.   . citalopram (CELEXA) 20 MG tablet Take 20 mg by mouth daily.  Marland Kitchen. donepezil (ARICEPT) 10 MG tablet Take 10 mg by mouth daily.  . ferrous gluconate (FERGON) 324 MG tablet Take 324 mg by mouth daily.  Marland Kitchen. levothyroxine (EUTHYROX) 50 MCG tablet Take 50 mcg by mouth daily before breakfast.  . lisinopril (PRINIVIL,ZESTRIL) 30 MG tablet TAKE 1 TABLET DAILY (NEED AN APPOINTMENT)  . meloxicam (MOBIC) 7.5 MG tablet Take 7.5 mg by mouth daily.   . methocarbamol (ROBAXIN) 500 MG tablet Take 1 tablet (500 mg total) by mouth 2 (two) times daily as needed.  . Multiple Vitamins-Minerals (CENTRUM SILVER) tablet Take 1 tablet by mouth daily.  Marland Kitchen. omeprazole (PRILOSEC) 20 MG capsule TAKE 1 CAPSULE DAILY  . Probiotic Product (PROBIOTIC PO) Take 1 tablet by mouth daily.    No current facility-administered medications for this  visit. (Other)      REVIEW OF SYSTEMS:    ALLERGIES Allergies  Allergen Reactions  . Codeine Other (See Comments)    Makes her crazy    PAST MEDICAL HISTORY Past Medical History:  Diagnosis Date  . Acid reflux   . Anemia   . Arthropathy   . Bradycardia    beta blocker reduced in 2021 due to this  . CKD (chronic kidney disease), stage III (HCC)   . Cognitive decline    referred to neurologist  . Depression 10/07/2013   daughter didn't know anything about this  . Dizziness   . Frequent PVCs   . GERD (gastroesophageal reflux disease) 05/04/2013  . Hip fx (HCC)    r hip, bilat pubis rami  . Hyperlipemia   . Hypertension   . Hypertensive retinopathy    OU  . Hypothyroidism   . Insomnia   . Macular degeneration    Exu OS  . Multilevel degenerative disc disease   . Palpitations 05/04/2013   saw cardiologist in 2015, he deleted A. Fib from the record as per note not documentation to support  . Premature atrial contractions   . PSVT (paroxysmal supraventricular tachycardia) (HCC)    a. short runs by monitor in 2021.  Marland Kitchen. Short-term memory loss   . Vitamin D deficiency    Past Surgical History:  Procedure Laterality Date  . ABDOMINAL HYSTERECTOMY  1971  . BLADDER SURGERY  2010  . Broken  Pelvis  2015  . C-EYE SURGERY PROCEDURE Bilateral    zaldivar  . CATARACT EXTRACTION Bilateral    [place]  . EYE SURGERY Bilateral    Cat Sx  . HIP PINNING,CANNULATED Right 10/07/2013   Procedure: CANNULATED screws right hip;  Surgeon: Kathryne Hitchhristopher Y Blackman, MD;  Location[facility] OR;  Service: Orthopedics;  Laterality: Right;    FAMILY HISTORY Family History  Problem Relation Age of Onset  . Prostate cancer Father   . Heart attack Sister   . Cancer Brother   . Cancer Sister   . Heart attack Sister   . Cancer Sister   . Stroke Daughter   . Dementia Neg Hx   . Alzheimer's disease Neg Hx     SOCIAL HISTORY Social History   Tobacco Use  . Smoking status: Former  Smoker    Types: Cigarettes    Quit date: 08/02/1970    Years since quitting: 49.5  . Smokeless tobacco: Never Used  . Tobacco comment: "smoked very little"  Vaping Use  . Vaping Use: Never used  Substance Use Topics  . Alcohol use: Never    Alcohol/week: 0.0 standard drinks  . Drug use: Never         OPHTHALMIC EXAM:  Not recorded     IMAGING AND PROCEDURES  Imaging and Procedures for @TODAY @           ASSESSMENT/PLAN:  No diagnosis found.  1-4. 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 w/ shallow SRF/serous RD  - initially presented with symptomatic floaters OS  - initial exam showed large peripheral temporal subretinal hemorrhage OS -- spanning 2-430 -- also mild vitreous opacities and condensations that were causing symptomatic floaters  - s/p IVA OS #1 (08.05.20) for subretinal hemorrhage, peripheral CNV  - s/p IVE OS #1 (09.02.20) - sample, #2 (10.12.20), #3 (11.11.20), #4 (12.09.20), #5 (1.20.21), #6 (02.17.21), #7 (03.19.21), #8 (04.30.21), #9 (06.16.21), #10 (08.11.21), #11 (12.01.21)  - today BCVA 20/40-2 OS  - exam shows stable improvement in subretinal heme and shallow SRF  - repeat color Optos image obtained -- peripheral SRH continues to improve -- very small, white and with surrounding pigment  - OCT shows interval improvement in central IRF/cystic changes OS  - B-scan (9.18.20) showed hyperreflective, ill-defined mass, 0300 periphery OS  - was seen by Dr. Pearletha FurlMaterin, Ocular Oncologist, on 9.22.2020 -- no tumor on exam or b-scan  - recommend IVE OS #12 today, 01.19.22 -- maintenance w/ f/u in 6-7 wks  - pt wishes to proceed  - RBA of procedure discussed, questions answered  - informed consent obtained  - Eylea informed consent form signed and scanned on 01.20.2021  - see procedure note  - E[employer] Benefits investigation initiated 09.02.2020 -- approved for 2021  - f/u in 6-7 wks, sooner prn, OCT/DFE/optos colors, possible injection  [facility] - Clinic Note  01/22/2020     CHIEF COMPLAINT Patient presents for No chief complaint on file.   HISTORY OF PRESENT ILLNESS: Tracey Rosario is a 87 y.o. female who presents to the clinic today for:   pt states she has not noticed any change in vision, she denies spikes in BP   Referring physician: Courtney ParisMcCoy, Rachel, NP [address] [place],  [place] [id number]  HISTORICAL INFORMATION:   Selected notes from the MEDICAL RECORD NUMBER Referred by Dr. Baker Pierinihris Weaver for retinal eval   CURRENT MEDICATIONS: Current Outpatient Medications (Ophthalmic Drugs)  Medication Sig  .  polyvinyl alcohol (ARTIFICIAL TEARS) 1.4 % ophthalmic solution Place 1 drop into both eyes as needed for dry eyes.   No current facility-administered medications for this visit. (Ophthalmic Drugs)   Current Outpatient Medications (Other)  Medication Sig  . amLODipine (NORVASC) 5 MG tablet Take 5 mg by mouth daily.  Marland Kitchen aspirin EC 81 MG tablet Take 1 tablet (81 mg total) by mouth daily.  . Cholecalciferol (VITAMIN D3) 125 MCG (5000 UT) TABS Take 5,000 Units by mouth daily.   . citalopram (CELEXA) 20 MG tablet Take 20 mg by mouth daily.  Marland Kitchen donepezil (ARICEPT) 10 MG tablet Take 10 mg by mouth daily.  . ferrous gluconate (FERGON) 324 MG tablet Take 324 mg by mouth daily.  Marland Kitchen levothyroxine (EUTHYROX) 50 MCG tablet Take 50 mcg by mouth daily before breakfast.  . lisinopril (PRINIVIL,ZESTRIL) 30 MG tablet TAKE 1 TABLET DAILY (NEED AN APPOINTMENT)  . meloxicam (MOBIC) 7.5 MG tablet Take 7.5 mg by mouth daily.   . methocarbamol (ROBAXIN) 500 MG tablet Take 1 tablet (500 mg total) by mouth 2 (two) times daily as needed.  . Multiple Vitamins-Minerals (CENTRUM SILVER) tablet Take 1 tablet by mouth daily.  Marland Kitchen omeprazole (PRILOSEC) 20 MG capsule TAKE 1 CAPSULE DAILY  . Probiotic Product (PROBIOTIC PO) Take 1 tablet by mouth daily.    No current facility-administered medications for this visit.  (Other)      REVIEW OF SYSTEMS:    ALLERGIES Allergies  Allergen Reactions  . Codeine Other (See Comments)    Makes her crazy    PAST MEDICAL HISTORY Past Medical History:  Diagnosis Date  . Acid reflux   . Anemia   . Arthropathy   . Bradycardia    beta blocker reduced in 2021 due to this  . CKD (chronic kidney disease), stage III (HCC)   . Cognitive decline    referred to neurologist  . Depression 10/07/2013   daughter didn't know anything about this  . Dizziness   . Frequent PVCs   . GERD (gastroesophageal reflux disease) 05/04/2013  . Hip fx (HCC)    r hip, bilat pubis rami  . Hyperlipemia   . Hypertension   . Hypertensive retinopathy    OU  . Hypothyroidism   . Insomnia   . Macular degeneration    Exu OS  . Multilevel degenerative disc disease   . Palpitations 05/04/2013   saw cardiologist in 2015, he deleted A. Fib from the record as per note not documentation to support  . Premature atrial contractions   . PSVT (paroxysmal supraventricular tachycardia) (HCC)    a. short runs by monitor in 2021.  Marland Kitchen Short-term memory loss   . Vitamin D deficiency    Past Surgical History:  Procedure Laterality Date  . ABDOMINAL HYSTERECTOMY  1971  . BLADDER SURGERY  2010  . Broken Pelvis  2015  . C-EYE SURGERY PROCEDURE Bilateral    zaldivar  . CATARACT EXTRACTION Bilateral    [place] in [place]  . EYE SURGERY Bilateral    Cat Sx  . HIP PINNING,CANNULATED Right 10/07/2013   Procedure: CANNULATED screws right hip;  Surgeon: Kathryne Hitch, MD;  Location: [facility] OR;  Service: Orthopedics;  Laterality: Right;    FAMILY HISTORY Family History  Problem Relation Age of Onset  . Prostate cancer Father   . Heart attack Sister   . Cancer Brother   . Cancer Sister   . Heart attack Sister   . Cancer Sister   . Stroke  Daughter   . Dementia Neg Hx   . Alzheimer's disease Neg Hx     SOCIAL HISTORY Social History   Tobacco Use  . Smoking status: Former Smoker     Types: Cigarettes    Quit date: 08/02/1970    Years since quitting: 49.5  . Smokeless tobacco: Never Used  . Tobacco comment: "smoked very little"  Vaping Use  . Vaping Use: Never used  Substance Use Topics  . Alcohol use: Never    Alcohol/week: 0.0 standard drinks  . Drug use: Never         OPHTHALMIC EXAM:  Not recorded     IMAGING AND PROCEDURES  Imaging and Procedures for @TODAY @           ASSESSMENT/PLAN:    ICD-10-CM   1. Exudative age-related macular degeneration of left eye with active choroidal neovascularization (HCC)  H35.3221   2. Subretinal hemorrhage of left eye  H35.62   3. Serous retinal detachment of left eye  H33.22   4. Retinal edema  H35.81   5. 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 Essential hypertension  I10   8. Hypertensive retinopathy of both eyes  H35.033   9. Pseudophakia of both eyes  Z96.1     1-4. 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 w/ shallow SRF/serous RD  - initially presented with symptomatic floaters OS  - initial exam showed large peripheral temporal subretinal hemorrhage OS -- spanning 2-430 -- also mild vitreous opacities and condensations that were causing symptomatic floaters  - s/p IVA OS #1 (08.05.20) for subretinal hemorrhage, peripheral CNV  - s/p IVE OS #1 (09.02.20) - sample, #2 (10.12.20), #3 (11.11.20), #4 (12.09.20), #5 (1.20.21), #6 (02.17.21), #7 (03.19.21), #8 (04.30.21), #9 (06.16.21), #10 (08.11.21)  - today BCVA 20/40-2 OS  - exam shows stable improvement in subretinal heme and shallow SRF  - repeat color Optos image obtained -- peripheral SRH continues to improve -- very small, white and with surrounding pigment  - OCT shows interval improvement in central IRF/cystic changes OS  - B-scan (9.18.20) showed hyperreflective, ill-defined mass, 0300 periphery OS  - was seen by Dr. Pearletha Furl, Ocular Oncologist, on 9.22.2020 -- no tumor on exam or  b-scan  - recommend IVE OS #11 today, 12.01.21 -- maintenance w/ f/u in 6-7 wks  - pt wishes to proceed  - RBA of procedure discussed, questions answered  - informed consent obtained  - Eylea informed consent form signed and scanned on 01.20.2021  - see procedure note  - E[employer] Benefits investigation initiated 09.02.2020 -- approved for 2021  - f/u in 6-7 wks, sooner prn, OCT/DFE/optos colors, possible injection  5. Epiretinal membrane, OU.  - mild ERM OU  - asymptomatic, no metamorphopsia  - no indication for surgery at this time  - monitor for now  6. Retinoschisis OD -- stable  - bullous schisis cavity, IT quadrant, from 0700-0830, extends posteriorly almost to arcades  - no associated RT/RD  - stable from prior  - discussed findings and prognosis  - recommend monitoring for now  - if progressing posteriorly, consider intervention  7,8. Hypertensive retinopathy OU  - discussed importance of tight BP control  - monitor  9. Pseudophakia OU  - s/p CE/IOL OU  - beautiful surgery, doing well  - monitor   Ophthalmic Meds Ordered this visit:  No orders of the defined types were placed in this encounter.  No follow-ups on file.  There are no Patient Instructions on file for this visit.   Explained the diagnoses, plan, and follow up with the patient and they expressed understanding.  Patient expressed understanding of the importance of proper follow up care.  This document serves as a record of services personally performed by Karie Chimera, MD, PhD. It was created on their behalf by Annalee Genta, COMT. The creation of this record is the provider's dictation and/or activities during the visit.  Electronically signed by: Annalee Genta, COMT 01/21/20 11:08 AM   This document serves as a record of services personally performed by Karie Chimera, MD, PhD. It was created on their behalf by Glee Arvin. Manson Passey, OA an ophthalmic technician. The creation of this record is the  provider's dictation and/or activities during the visit.    Electronically signed by: Glee Arvin. Manson Passey, [place] 12.01.2021 11:08 AM   Karie Chimera, M.D., Ph.D. Diseases & Surgery of the Retina and Vitreous [facility]  I have reviewed the above documentation for accuracy and completeness, and I agree with the above. Karie Chimera, M.D., Ph.D. 12/04/19 11:08 AM   Abbreviations: M myopia (nearsighted); A astigmatism; H hyperopia (farsighted); P presbyopia; Mrx spectacle prescription;  CTL contact lenses; OD right eye; OS left eye; OU both eyes  XT exotropia; ET esotropia; PEK punctate epithelial keratitis; PEE punctate epithelial erosions; DES dry eye syndrome; MGD meibomian gland dysfunction; ATs artificial tears; PFAT's preservative free artificial tears; NSC nuclear sclerotic cataract; PSC posterior subcapsular cataract; ERM epi-retinal membrane; PVD posterior vitreous detachment; RD retinal detachment; DM diabetes mellitus; DR diabetic retinopathy; NPDR non-proliferative diabetic retinopathy; PDR proliferative diabetic retinopathy; CSME clinically significant macular edema; DME diabetic macular edema; dbh dot blot hemorrhages; CWS cotton wool spot; POAG primary open angle glaucoma; C/D cup-to-disc ratio; HVF humphrey visual field; GVF goldmann visual field; OCT optical coherence tomography; IOP intraocular pressure; BRVO Branch retinal vein occlusion; CRVO central retinal vein occlusion; CRAO central retinal artery occlusion; BRAO branch retinal artery occlusion; RT retinal tear; SB scleral buckle; PPV pars plana vitrectomy; VH Vitreous hemorrhage; PRP panretinal laser photocoagulation; IVK intravitreal kenalog; VMT vitreomacular traction; MH Macular hole;  NVD neovascularization of the disc; NVE neovascularization elsewhere; AREDS age related eye disease study; ARMD age related macular degeneration; POAG primary open angle glaucoma; EBMD epithelial/anterior basement membrane  dystrophy; ACIOL anterior chamber intraocular lens; IOL intraocular lens; PCIOL posterior chamber intraocular lens; Phaco/IOL phacoemulsification with intraocular lens placement; PRK photorefractive keratectomy; LASIK laser assisted in situ keratomileusis; HTN hypertension; DM diabetes mellitus; COPD chronic obstructive pulmonary disease   5. Epiretinal membrane, OU.  - mild ERM OU  - asymptomatic, no metamorphopsia  - no indication for surgery at this time  - monitor for now  6. Retinoschisis OD -- stable  - bullous schisis cavity, IT quadrant, from 0700-0830, extends posteriorly almost to arcades  - no associated RT/RD  - stable from prior  - discussed findings and prognosis  - recommend monitoring for now  - if progressing posteriorly, then consider intervention  7,8. Hypertensive retinopathy OU  - discussed importance of tight BP control  - monitor  9. Pseudophakia OU  - s/p CE/IOL OU  - beautiful surgery, doing well  - monitor   Ophthalmic Meds Ordered this visit:  No orders of the defined types were placed in this encounter.      No follow-ups on file.  There are no Patient Instructions on file for this visit.  Explained the diagnoses, plan, and follow up with the patient and they expressed understanding.  Patient expressed understanding of the importance of proper follow up care.  This document serves as a record of services personally performed by Karie Chimera, MD, PhD. It was created on their behalf by Annalee Genta, COMT. The creation of this record is the provider's dictation and/or activities during the visit.  Electronically signed by: Annalee Genta, COMT 01/21/20 11:05 AM     Karie Chimera, M.D., Ph.D. Diseases & Surgery of the Retina and Vitreous [facility]    Abbreviations: M myopia (nearsighted); A astigmatism; H hyperopia (farsighted); P presbyopia; Mrx spectacle prescription;  CTL contact lenses; OD right eye; OS left  eye; OU both eyes  XT exotropia; ET esotropia; PEK punctate epithelial keratitis; PEE punctate epithelial erosions; DES dry eye syndrome; MGD meibomian gland dysfunction; ATs artificial tears; PFAT's preservative free artificial tears; NSC nuclear sclerotic cataract; PSC posterior subcapsular cataract; ERM epi-retinal membrane; PVD posterior vitreous detachment; RD retinal detachment; DM diabetes mellitus; DR diabetic retinopathy; NPDR non-proliferative diabetic retinopathy; PDR proliferative diabetic retinopathy; CSME clinically significant macular edema; DME diabetic macular edema; dbh dot blot hemorrhages; CWS cotton wool spot; POAG primary open angle glaucoma; C/D cup-to-disc ratio; HVF humphrey visual field; GVF goldmann visual field; OCT optical coherence tomography; IOP intraocular pressure; BRVO Branch retinal vein occlusion; CRVO central retinal vein occlusion; CRAO central retinal artery occlusion; BRAO branch retinal artery occlusion; RT retinal tear; SB scleral buckle; PPV pars plana vitrectomy; VH Vitreous hemorrhage; PRP panretinal laser photocoagulation; IVK intravitreal kenalog; VMT vitreomacular traction; MH Macular hole;  NVD neovascularization of the disc; NVE neovascularization elsewhere; AREDS age related eye disease study; ARMD age related macular degeneration; POAG primary open angle glaucoma; EBMD epithelial/anterior basement membrane dystrophy; ACIOL anterior chamber intraocular lens; IOL intraocular lens; PCIOL posterior chamber intraocular lens; Phaco/IOL phacoemulsification with intraocular lens placement; PRK photorefractive keratectomy; LASIK laser assisted in situ keratomileusis; HTN hypertension; DM diabetes mellitus; COPD chronic obstructive pulmonary disease

## 2020-01-22 ENCOUNTER — Encounter (INDEPENDENT_AMBULATORY_CARE_PROVIDER_SITE_OTHER): Payer: Medicare Other | Admitting: Ophthalmology

## 2020-01-22 DIAGNOSIS — H3322 Serous retinal detachment, left eye: Secondary | ICD-10-CM

## 2020-01-22 DIAGNOSIS — H353221 Exudative age-related macular degeneration, left eye, with active choroidal neovascularization: Secondary | ICD-10-CM

## 2020-01-22 DIAGNOSIS — H35033 Hypertensive retinopathy, bilateral: Secondary | ICD-10-CM

## 2020-01-22 DIAGNOSIS — H33101 Unspecified retinoschisis, right eye: Secondary | ICD-10-CM

## 2020-01-22 DIAGNOSIS — H3581 Retinal edema: Secondary | ICD-10-CM

## 2020-01-22 DIAGNOSIS — H3562 Retinal hemorrhage, left eye: Secondary | ICD-10-CM

## 2020-01-22 DIAGNOSIS — H35373 Puckering of macula, bilateral: Secondary | ICD-10-CM

## 2020-01-22 DIAGNOSIS — I10 Essential (primary) hypertension: Secondary | ICD-10-CM

## 2020-01-22 DIAGNOSIS — Z961 Presence of intraocular lens: Secondary | ICD-10-CM

## 2020-01-28 NOTE — Progress Notes (Signed)
[facility] - Clinic Note  01/29/2020     CHIEF COMPLAINT Patient presents for Retina Follow Up   HISTORY OF PRESENT ILLNESS: Tracey Rosario is a 87 y.o. female who presents to the clinic today for:   HPI    Retina Follow Up    Patient presents with  Wet AMD.  In left eye.  Duration of 8 weeks.  Since onset it is stable.  I, the attending physician,  performed the HPI with the patient and updated documentation appropriately.          Comments    8 week follow up Exu ARMD OS- Vision is doing well but when she is about to come for her exam it starts to get blurry OS.  Using Restasis BID OS.         Last edited by Rennis Chris, MD on 01/29/2020  2:57 PM. (History)    pt states    Referring physician: Courtney Paris, NP [address] [place],  [place] [address]  HISTORICAL INFORMATION:   Selected notes from the MEDICAL RECORD NUMBER Referred by Dr. Baker Pierini for retinal eval   CURRENT MEDICATIONS: Current Outpatient Medications (Ophthalmic Drugs)  Medication Sig  . polyvinyl alcohol (LIQUIFILM TEARS) 1.4 % ophthalmic solution Place 1 drop into both eyes as needed for dry eyes.   No current facility-administered medications for this visit. (Ophthalmic Drugs)   Current Outpatient Medications (Other)  Medication Sig  . amLODipine (NORVASC) 5 MG tablet Take 5 mg by mouth daily.  Marland Kitchen aspirin EC 81 MG tablet Take 1 tablet (81 mg total) by mouth daily.  . Cholecalciferol (VITAMIN D3) 125 MCG (5000 UT) TABS Take 5,000 Units by mouth daily.   . citalopram (CELEXA) 20 MG tablet Take 20 mg by mouth daily.  Marland Kitchen donepezil (ARICEPT) 10 MG tablet Take 10 mg by mouth daily.  . ferrous gluconate (FERGON) 324 MG tablet Take 324 mg by mouth daily.  Marland Kitchen levothyroxine (SYNTHROID) 50 MCG tablet Take 50 mcg by mouth daily before breakfast.  . lisinopril (PRINIVIL,ZESTRIL) 30 MG tablet TAKE 1 TABLET DAILY (NEED AN APPOINTMENT)  . meloxicam (MOBIC) 7.5 MG tablet Take  7.5 mg by mouth daily.   . methocarbamol (ROBAXIN) 500 MG tablet Take 1 tablet (500 mg total) by mouth 2 (two) times daily as needed.  . Multiple Vitamins-Minerals (CENTRUM SILVER) tablet Take 1 tablet by mouth daily.  Marland Kitchen omeprazole (PRILOSEC) 20 MG capsule TAKE 1 CAPSULE DAILY  . Probiotic Product (PROBIOTIC PO) Take 1 tablet by mouth daily.    No current facility-administered medications for this visit. (Other)      REVIEW OF SYSTEMS: ROS    Positive for: Gastrointestinal, Neurological, Musculoskeletal, Cardiovascular, Eyes   Negative for: Constitutional, Skin, Genitourinary, HENT, Endocrine, Respiratory, Psychiatric, Allergic/Imm, Heme/Lymph   Last edited by Joni Reining, COA on 01/29/2020  1:22 PM. (History)       ALLERGIES Allergies  Allergen Reactions  . Codeine Other (See Comments)    Makes her crazy    PAST MEDICAL HISTORY Past Medical History:  Diagnosis Date  . Acid reflux   . Anemia   . Arthropathy   . Bradycardia    beta blocker reduced in 2021 due to this  . CKD (chronic kidney disease), stage III (HCC)   . Cognitive decline    referred to neurologist  . Depression 10/07/2013   daughter didn't know anything about this  . Dizziness   . Frequent PVCs   .  GERD (gastroesophageal reflux disease) 05/04/2013  . Hip fx (HCC)    r hip, bilat pubis rami  . Hyperlipemia   . Hypertension   . Hypertensive retinopathy    OU  . Hypothyroidism   . Insomnia   . Macular degeneration    Exu OS  . Multilevel degenerative disc disease   . Palpitations 05/04/2013   saw cardiologist in 2015, he deleted A. Fib from the record as per note not documentation to support  . Premature atrial contractions   . PSVT (paroxysmal supraventricular tachycardia) (HCC)    a. short runs by monitor in 2021.  Marland Kitchen. Short-term memory loss   . Vitamin D deficiency    Past Surgical History:  Procedure Laterality Date  . ABDOMINAL HYSTERECTOMY  1971  . BLADDER SURGERY  2010  . Broken Pelvis   2015  . C-EYE SURGERY PROCEDURE Bilateral    zaldivar  . CATARACT EXTRACTION Bilateral    Done in [place]  . EYE SURGERY Bilateral    Cat Sx  . HIP PINNING,CANNULATED Right 10/07/2013   Procedure: CANNULATED screws right hip;  Surgeon: Kathryne Hitchhristopher Y Blackman, MD;  [facility];  Service: Orthopedics;  Laterality: Right;    FAMILY HISTORY Family History  Problem Relation Age of Onset  . Prostate cancer Father   . Heart attack Sister   . Cancer Brother   . Cancer Sister   . Heart attack Sister   . Cancer Sister   . Stroke Daughter   . Dementia Neg Hx   . Alzheimer's disease Neg Hx     SOCIAL HISTORY Social History   Tobacco Use  . Smoking status: Former Smoker    Types: Cigarettes    Quit date: 08/02/1970    Years since quitting: 49.5  . Smokeless tobacco: Never Used  . Tobacco comment: "smoked very little"  Vaping Use  . Vaping Use: Never used  Substance Use Topics  . Alcohol use: Never    Alcohol/week: 0.0 standard drinks  . Drug use: Never         OPHTHALMIC EXAM:  Base Eye Exam    Visual Acuity (Snellen - Linear)      Right Left   Dist [place] 20/25 -2 20/50 -2   Dist ph [place] NI 20/40       Tonometry (Tonopen, 1:27 PM)      Right Left   Pressure 15 14       Pupils      Dark Light Shape React APD   Right 3 2 Round Brisk None   Left 3 2 Round Brisk None       Visual Fields (Counting fingers)      Left Right    Full Full       Extraocular Movement      Right Left    Full Full       Neuro/Psych    Oriented x3: Yes   Mood/Affect: Normal       Dilation    Both eyes: 1.0% Mydriacyl, 2.5% Phenylephrine @ 1:28 PM        Slit Lamp and Fundus Exam    Slit Lamp Exam      Right Left   Lids/Lashes Dermatochalasis - upper lid Dermatochalasis - upper lid   Conjunctiva/Sclera White and quiet White and quiet   Cornea Arcus, 1-2+ Punctate epithelial erosions, mild EBMD and corneal haze Arcus, 2-3+ Punctate epithelial erosions, EBMD, irregular epi    Anterior Chamber Deep and quiet Deep and  quiet   Iris Round and dilated Round and moderately dilated to 5mm   Lens PC IOL in good position with open PC, anterior capsular phimosis PC IOL in good position with open PC   Vitreous Vitreous syneresis, Posterior vitreous detachment Vitreous syneresis; PVD; vitreous condensations       Fundus Exam      Right Left   Disc Mild temporal Pallor, Peripapillary atrophy, Sharp rim, Compact mild tilt, 2+ temporal Pallor, Peripapillary atrophy, Compact, Sharp rim   C/D Ratio 0.1 0.0   Macula Flat, Blunted foveal reflex, mild Epiretinal membrane, drusen, RPE mottling and clumping, No heme or edema Flat, Blunted foveal reflex, RPE mottling and clumping, Epiretinal membrane, mild central cystic changes, No heme    Vessels Vascular attenuation, Tortuous attenuated, mild tortuousity   Periphery bullous IT schisis cavity from 0700-0830 extending posteriorly almost to arcades otherwise attached -- appears slightly less bullous, focal pigmented CR scar at 0630 persistent temporal sub-retinal hemorrhage - now white with surrounding pigment clumping;  No SRF -- completely resolved, No new heme, shallow schisis IT quad          IMAGING AND PROCEDURES  Imaging and Procedures for @TODAY @  OCT, Retina - OU - Both Eyes       Right Eye Quality was good. Central Foveal Thickness: 337. Progression has been stable. Findings include epiretinal membrane, normal foveal contour, no IRF, no SRF, retinal drusen , pigment epithelial detachment, macular pucker (?progression of ERM; bullous schisis IT quad caught on widefield).   Left Eye Quality was good. Central Foveal Thickness: 324. Progression has been stable. Findings include epiretinal membrane, retinal drusen , preretinal fibrosis, abnormal foveal contour, no SRF, no IRF (Trace cystic changes; IT schisis caught on widefield).   Notes *Images captured and stored on drive  Diagnosis / Impression:  OD: Stable ERM &  retinal drusen OS: trace cystic changes  Clinical management:  See below  Abbreviations: NFP - Normal foveal profile. CME - cystoid macular edema. PED - pigment epithelial detachment. IRF - intraretinal fluid. SRF - subretinal fluid. EZ - ellipsoid zone. ERM - epiretinal membrane. ORA - outer retinal atrophy. ORT - outer retinal tubulation. SRHM - subretinal hyper-reflective material        Intravitreal Injection, Pharmacologic Agent - OS - Left Eye       Time Out 01/29/2020. 1:31 PM. Confirmed correct patient, procedure, site, and patient consented.   Anesthesia Topical anesthesia was used. Anesthetic medications included Lidocaine 2%, Proparacaine 0.5%.   Procedure Preparation included 5% betadine to ocular surface, eyelid speculum. A (32g) needle was used.   Injection:  2 mg aflibercept Gretta Cool(EYLEA) SOLN   NDC: [id number], Lot: [id number], Expiration date: 05/02/2020   Route: Intravitreal, Site: Left Eye, Waste: 0.05 mL  Post-op Post injection exam found visual acuity of at least counting fingers. The patient tolerated the procedure well. There were no complications. The patient received written and verbal post procedure care education. Post injection medications were not given.        Color Fundus Photography Optos - OU - Both Eyes       Right Eye Progression has been stable. Disc findings include normal observations. Macula : retinal pigment epithelium abnormalities. Vessels : attenuated, tortuous vessels. Periphery : RPE abnormality (IT schisis -- stable from prior).   Left Eye Progression has been stable. Disc findings include normal observations. Macula : normal observations. Vessels : attenuated. Periphery : RPE abnormality, hemorrhage (improvement in sub retinal hemorrhage temporally (0200) -- SRH now  white and pigment clumping surrounding).   Notes **Images stored on drive**  Impression: OD: bullous retinoschisis, IT periphery -- stable from prior OS: PEHCR with  stably improved sub retinal hemorrhage temporally (0200)                  ASSESSMENT/PLAN:    ICD-10-CM   1. Exudative age-related macular degeneration of left eye with active choroidal neovascularization (HCC)  H35.3221 Intravitreal Injection, Pharmacologic Agent - OS - Left Eye    Color Fundus Photography Optos - OU - Both Eyes    aflibercept (EYLEA) SOLN 2 mg  2. Subretinal hemorrhage of left eye  H35.62 Color Fundus Photography Optos - OU - Both Eyes  3. Serous retinal detachment of left eye  H33.22   4. Retinal edema  H35.81 OCT, Retina - OU - Both Eyes  5. 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 Essential hypertension  I10   8. Hypertensive retinopathy of both eyes  H35.033   9. Pseudophakia of both eyes  Z96.1     1-4. 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 w/ shallow SRF/serous RD  - delayed f/u from 6-7 wks to 8 wks  - initially presented with symptomatic floaters OS  - initial exam showed large peripheral temporal subretinal hemorrhage OS -- spanning 2-430 -- also mild vitreous opacities and condensations that were causing symptomatic floaters  - s/p IVA OS #1 (08.05.20) for subretinal hemorrhage, peripheral CNV  - s/p IVE OS #1 (09.02.20) - sample, #2 (10.12.20), #3 (11.11.20), #4 (12.09.20), #5 (1.20.21), #6 (02.17.21), #7 (03.19.21), #8 (04.30.21), #9 (06.16.21), #10 (08.11.21), #11 (12.01.21)  - today BCVA 20/40 OS (stable)  - exam shows stable improvement in subretinal heme and shallow SRF  - repeat color Optos image obtained -- peripheral SRH continues to improve -- very small, white and with surrounding pigment  - OCT shows trace cystic changes OS  - B-scan (9.18.20) showed hyperreflective, ill-defined mass, 0300 periphery OS  - was seen by Dr. Pearletha Furl, Ocular Oncologist, on 9.22.2020 -- no tumor on exam or b-scan  - recommend IVE OS #12 today, 01.26.22 -- maintenance w/ f/u in 8-10 wks  - pt  wishes to proceed  - RBA of procedure discussed, questions answered  - informed consent obtained  - Eylea informed consent form signed and scanned on 01.20.2021  - see procedure note  - Eylea4U Benefits investigation initiated 09.02.2020 -- approved for 2021  - f/u in 8-10 wks, sooner prn, OCT/DFE/optos colors, possible injection  5. Epiretinal membrane, OU.  - mild ERM OU  - asymptomatic, no metamorphopsia  - no indication for surgery at this time  - monitor for now  6. Retinoschisis OD -- stable  - bullous schisis cavity, IT quadrant, from 0700-0830, extends posteriorly almost to arcades  - no associated RT/RD  - stable from prior  - discussed findings and prognosis  - recommend monitoring for now  - if progressing posteriorly, consider intervention  7,8. Hypertensive retinopathy OU  - discussed importance of tight BP control  - monitor  9. Pseudophakia OU  - s/p CE/IOL OU  - beautiful surgery, doing well  - monitor   Ophthalmic Meds Ordered this visit:  Meds ordered this encounter  Medications  . aflibercept (EYLEA) SOLN 2 mg       Return for f/u 8-10 weeks, exu ARMD OS, DFE, OCT.  There are no Patient Instructions on file for this visit.  Explained the diagnoses, plan, and follow up with the patient and they expressed understanding.  Patient expressed understanding of the importance of proper follow up care.  This document serves as a record of services personally performed by Karie Chimera, MD, PhD. It was created on their behalf by Annalee Genta, COMT. The creation of this record is the provider's dictation and/or activities during the visit.  Electronically signed by: Annalee Genta, COMT 02/02/20 1:09 AM   This document serves as a record of services personally performed by Karie Chimera, MD, PhD. It was created on their behalf by Glee Arvin. Manson Passey, OA an ophthalmic technician. The creation of this record is the provider's dictation and/or activities during  the visit.    Electronically signed by: Glee Arvin. Manson Passey, [place] 01.26.2022 1:09 AM  Karie Chimera, M.D., Ph.D. Diseases & Surgery of the Retina and Vitreous [facility]  I have reviewed the above documentation for accuracy and completeness, and I agree with the above. Karie Chimera, M.D., Ph.D. 02/02/20 1:09 AM   Abbreviations: M myopia (nearsighted); A astigmatism; H hyperopia (farsighted); P presbyopia; Mrx spectacle prescription;  CTL contact lenses; OD right eye; OS left eye; OU both eyes  XT exotropia; ET esotropia; PEK punctate epithelial keratitis; PEE punctate epithelial erosions; DES dry eye syndrome; MGD meibomian gland dysfunction; ATs artificial tears; PFAT's preservative free artificial tears; NSC nuclear sclerotic cataract; PSC posterior subcapsular cataract; ERM epi-retinal membrane; PVD posterior vitreous detachment; RD retinal detachment; DM diabetes mellitus; DR diabetic retinopathy; NPDR non-proliferative diabetic retinopathy; PDR proliferative diabetic retinopathy; CSME clinically significant macular edema; DME diabetic macular edema; dbh dot blot hemorrhages; CWS cotton wool spot; POAG primary open angle glaucoma; C/D cup-to-disc ratio; HVF humphrey visual field; GVF goldmann visual field; OCT optical coherence tomography; IOP intraocular pressure; BRVO Branch retinal vein occlusion; CRVO central retinal vein occlusion; CRAO central retinal artery occlusion; BRAO branch retinal artery occlusion; RT retinal tear; SB scleral buckle; PPV pars plana vitrectomy; VH Vitreous hemorrhage; PRP panretinal laser photocoagulation; IVK intravitreal kenalog; VMT vitreomacular traction; MH Macular hole;  NVD neovascularization of the disc; NVE neovascularization elsewhere; AREDS age related eye disease study; ARMD age related macular degeneration; POAG primary open angle glaucoma; EBMD epithelial/anterior basement membrane dystrophy; ACIOL anterior chamber intraocular lens;  IOL intraocular lens; PCIOL posterior chamber intraocular lens; Phaco/IOL phacoemulsification with intraocular lens placement; PRK photorefractive keratectomy; LASIK laser assisted in situ keratomileusis; HTN hypertension; DM diabetes mellitus; COPD chronic obstructive pulmonary disease

## 2020-01-29 ENCOUNTER — Ambulatory Visit (INDEPENDENT_AMBULATORY_CARE_PROVIDER_SITE_OTHER): Payer: Medicare Other | Admitting: Ophthalmology

## 2020-01-29 ENCOUNTER — Encounter (INDEPENDENT_AMBULATORY_CARE_PROVIDER_SITE_OTHER): Payer: Self-pay | Admitting: Ophthalmology

## 2020-01-29 ENCOUNTER — Other Ambulatory Visit: Payer: Self-pay

## 2020-01-29 DIAGNOSIS — H33101 Unspecified retinoschisis, right eye: Secondary | ICD-10-CM

## 2020-01-29 DIAGNOSIS — H353221 Exudative age-related macular degeneration, left eye, with active choroidal neovascularization: Secondary | ICD-10-CM | POA: Diagnosis not present

## 2020-01-29 DIAGNOSIS — H3581 Retinal edema: Secondary | ICD-10-CM | POA: Diagnosis not present

## 2020-01-29 DIAGNOSIS — H3562 Retinal hemorrhage, left eye: Secondary | ICD-10-CM

## 2020-01-29 DIAGNOSIS — H35033 Hypertensive retinopathy, bilateral: Secondary | ICD-10-CM

## 2020-01-29 DIAGNOSIS — H3322 Serous retinal detachment, left eye: Secondary | ICD-10-CM

## 2020-01-29 DIAGNOSIS — Z961 Presence of intraocular lens: Secondary | ICD-10-CM

## 2020-01-29 DIAGNOSIS — I10 Essential (primary) hypertension: Secondary | ICD-10-CM

## 2020-01-29 DIAGNOSIS — H35373 Puckering of macula, bilateral: Secondary | ICD-10-CM

## 2020-01-29 MED ORDER — AFLIBERCEPT 2MG/0.05ML IZ SOLN FOR KALEIDOSCOPE
2.0000 mg | INTRAVITREAL | Status: AC | PRN
  Administered 2020-01-29: 2 mg via INTRAVITREAL

## 2020-03-31 NOTE — Progress Notes (Signed)
[facility] - Clinic Note  04/01/2020     CHIEF COMPLAINT Patient presents for Retina Follow Up   HISTORY OF PRESENT ILLNESS: Tracey Rosario is a 87 y.o. female who presents to the clinic today for:   HPI    Retina Follow Up    Patient presents with  Wet AMD.  In left eye.  This started 9 weeks ago.  I, the attending physician,  performed the HPI with the patient and updated documentation appropriately.          Comments    Patient here for 9 weeks retina follow up for exu ARMD OS. Patient states vision can tell when needs another shot. It is blurry like a skim. No eye pain.        Last edited by Rennis Chris, MD on 04/01/2020  2:30 PM. (History)    pt states    Referring physician: Courtney Paris, NP [address] [place],  [place] [address]  HISTORICAL INFORMATION:   Selected notes from the MEDICAL RECORD NUMBER Referred by Dr. Baker Pierini for retinal eval   CURRENT MEDICATIONS: Current Outpatient Medications (Ophthalmic Drugs)  Medication Sig  . Bromfenac Sodium (PROLENSA) 0.07 % SOLN Place 1 drop into the left eye 4 (four) times daily.  . prednisoLONE acetate (PRED FORTE) 1 % ophthalmic suspension Place 1 drop into the left eye 4 (four) times daily.  . polyvinyl alcohol (LIQUIFILM TEARS) 1.4 % ophthalmic solution Place 1 drop into both eyes as needed for dry eyes.   No current facility-administered medications for this visit. (Ophthalmic Drugs)   Current Outpatient Medications (Other)  Medication Sig  . amLODipine (NORVASC) 5 MG tablet Take 5 mg by mouth daily.  Marland Kitchen aspirin EC 81 MG tablet Take 1 tablet (81 mg total) by mouth daily.  . Cholecalciferol (VITAMIN D3) 125 MCG (5000 UT) TABS Take 5,000 Units by mouth daily.   . citalopram (CELEXA) 20 MG tablet Take 20 mg by mouth daily.  Marland Kitchen donepezil (ARICEPT) 10 MG tablet Take 10 mg by mouth daily.  . ferrous gluconate (FERGON) 324 MG tablet Take 324 mg by mouth daily.  Marland Kitchen levothyroxine  (SYNTHROID) 50 MCG tablet Take 50 mcg by mouth daily before breakfast.  . lisinopril (PRINIVIL,ZESTRIL) 30 MG tablet TAKE 1 TABLET DAILY (NEED AN APPOINTMENT)  . meloxicam (MOBIC) 7.5 MG tablet Take 7.5 mg by mouth daily.   . methocarbamol (ROBAXIN) 500 MG tablet Take 1 tablet (500 mg total) by mouth 2 (two) times daily as needed.  . Multiple Vitamins-Minerals (CENTRUM SILVER) tablet Take 1 tablet by mouth daily.  Marland Kitchen omeprazole (PRILOSEC) 20 MG capsule TAKE 1 CAPSULE DAILY  . Probiotic Product (PROBIOTIC PO) Take 1 tablet by mouth daily.    No current facility-administered medications for this visit. (Other)      REVIEW OF SYSTEMS: ROS    Positive for: Gastrointestinal, Neurological, Musculoskeletal, Cardiovascular, Eyes   Negative for: Constitutional, Skin, Genitourinary, HENT, Endocrine, Respiratory, Psychiatric, Allergic/Imm, Heme/Lymph   Last edited by Laddie Aquas, COA on 04/01/2020  1:15 PM. (History)       ALLERGIES Allergies  Allergen Reactions  . Codeine Other (See Comments)    Makes her crazy    PAST MEDICAL HISTORY Past Medical History:  Diagnosis Date  . Acid reflux   . Anemia   . Arthropathy   . Bradycardia    beta blocker reduced in 2021 due to this  . CKD (chronic kidney disease), stage III (HCC)   .  Cognitive decline    referred to neurologist  . Depression 10/07/2013   daughter didn't know anything about this  . Dizziness   . Frequent PVCs   . GERD (gastroesophageal reflux disease) 05/04/2013  . Hip fx (HCC)    r hip, bilat pubis rami  . Hyperlipemia   . Hypertension   . Hypertensive retinopathy    OU  . Hypothyroidism   . Insomnia   . Macular degeneration    Exu OS  . Multilevel degenerative disc disease   . Palpitations 05/04/2013   saw cardiologist in 2015, he deleted A. Fib from the record as per note not documentation to support  . Premature atrial contractions   . PSVT (paroxysmal supraventricular tachycardia) (HCC)    a. short runs by  monitor in 2021.  Marland Kitchen. Short-term memory loss   . Vitamin D deficiency    Past Surgical History:  Procedure Laterality Date  . ABDOMINAL HYSTERECTOMY  1971  . BLADDER SURGERY  2010  . Broken Pelvis  2015  . C-EYE SURGERY PROCEDURE Bilateral    z[facility]divar  . CATARACT EXTRACTION Bilateral    Done in [place]  . EYE SURGERY Bilateral    Cat Sx  . HIP PINNING,CANNULATED Right 10/07/2013   Procedure: CANNULATED screws right hip;  Surgeon: Kathryne Hitchhristopher Y Blackman, MD;  [facility] OR;  Service: Orthopedics;  Laterality: Right;    FAMILY HISTORY Family History  Problem Relation Age of Onset  . Prostate cancer Father   . Heart attack Sister   . Cancer Brother   . Cancer Sister   . Heart attack Sister   . Cancer Sister   . Stroke Daughter   . Dementia Neg Hx   . Alzheimer's disease Neg Hx     SOCIAL HISTORY Social History   Tobacco Use  . Smoking status: Former Smoker    Types: Cigarettes    Quit date: 08/02/1970    Years since quitting: 49.6  . Smokeless tobacco: Never Used  . Tobacco comment: "smoked very little"  Vaping Use  . Vaping Use: Never used  Substance Use Topics  . Alcohol use: Never    Alcohol/week: 0.0 standard drinks  . Drug use: Never         OPHTHALMIC EXAM:  Base Eye Exam    Visual Acuity (Snellen - Linear)      Right Left   Dist [place] 20/25 -2 20/80   Dist ph [place] NI 20/70 -2       Tonometry (Tonopen, 1:12 PM)      Right Left   Pressure 14 12       Pupils      Dark Light Shape React APD   Right 3 2 Round Brisk None   Left 3 2 Round Brisk None       Visual Fields (Counting fingers)      Left Right    Full Full       Extraocular Movement      Right Left    Full Full       Neuro/Psych    Oriented x3: Yes   Mood/Affect: Normal       Dilation    Both eyes: 1.0% Mydriacyl, 2.5% Phenylephrine @ 1:12 PM        Slit Lamp and Fundus Exam    Slit Lamp Exam      Right Left   Lids/Lashes Dermatochalasis - upper lid Dermatochalasis -  upper lid   Conjunctiva/Sclera White and quiet White and  quiet   Cornea Arcus, 1-2+ Punctate epithelial erosions, mild EBMD and corneal haze Arcus, 2-3+ Punctate epithelial erosions, EBMD, irregular epi   Anterior Chamber Deep and quiet Deep and quiet   Iris Round and dilated Round and moderately dilated to 85mm   Lens PC IOL in good position with open PC, anterior capsular phimosis PC IOL in good position with open PC   Vitreous Vitreous syneresis, Posterior vitreous detachment Vitreous syneresis; PVD; vitreous condensations       Fundus Exam      Right Left   Disc Mild temporal Pallor, Peripapillary atrophy, Sharp rim, Compact mild tilt, 2+ temporal Pallor, Peripapillary atrophy, Compact, Sharp rim   C/D Ratio 0.1 0.0   Macula Flat, Blunted foveal reflex, mild Epiretinal membrane, drusen, RPE mottling and clumping, No heme or edema Flat, Blunted foveal reflex, RPE mottling and clumping, Epiretinal membrane, interval increase in central cystic changes, No heme    Vessels Vascular attenuation, Tortuous attenuated, mild tortuousity   Periphery bullous IT schisis cavity from 0700-0830 extending posteriorly almost to arcades otherwise attached -- appears slightly less bullous, focal pigmented CR scar at 0630 persistent temporal sub-retinal hemorrhage - now white with surrounding pigment clumping;  No SRF -- completely resolved, new focal, punctate IRH overlying, shallow schisis IT quad          IMAGING AND PROCEDURES  Imaging and Procedures for @TODAY @  OCT, Retina - OU - Both Eyes       Right Eye Quality was good. Central Foveal Thickness: 341. Progression has been stable. Findings include epiretinal membrane, normal foveal contour, no IRF, no SRF, retinal drusen , pigment epithelial detachment, macular pucker (?progression of ERM; bullous schisis IT quad caught on widefield).   Left Eye Quality was good. Central Foveal Thickness: 489. Progression has worsened. Findings include  epiretinal membrane, retinal drusen , preretinal fibrosis, abnormal foveal contour, no SRF, no IRF (Interval development of central IRF -- CME-like appearance).   Notes *Images captured and stored on drive  Diagnosis / Impression:  OD: Stable ERM & retinal drusen OS: Interval development of central IRF -- CME-like appearance  Clinical management:  See below  Abbreviations: NFP - Normal foveal profile. CME - cystoid macular edema. PED - pigment epithelial detachment. IRF - intraretinal fluid. SRF - subretinal fluid. EZ - ellipsoid zone. ERM - epiretinal membrane. ORA - outer retinal atrophy. ORT - outer retinal tubulation. SRHM - subretinal hyper-reflective material        Color Fundus Photography Optos - OU - Both Eyes       Right Eye Progression has been stable. Disc findings include normal observations. Macula : retinal pigment epithelium abnormalities. Vessels : attenuated, tortuous vessels. Periphery : RPE abnormality (IT schisis -- stable from prior).   Left Eye Progression has been stable. Disc findings include normal observations. Macula : normal observations. Vessels : attenuated. Periphery : RPE abnormality, hemorrhage (improvement in sub retinal hemorrhage temporally (0200) -- SRH now white and pigment clumping surrounding).   Notes **Images stored on drive**  Impression: OD: bullous retinoschisis, IT periphery -- stable from prior OS: PEHCR with stably improved sub retinal hemorrhage temporally (0200)         Intravitreal Injection, Pharmacologic Agent - OS - Left Eye       Time Out 04/01/2020. 2:20 PM. Confirmed correct patient, procedure, site, and patient consented.   Anesthesia Topical anesthesia was used. Anesthetic medications included Lidocaine 2%, Proparacaine 0.5%.   Procedure Preparation included 5% betadine to ocular surface, eyelid  speculum. A (32g) needle was used.   Injection:  2 mg aflibercept Gretta Cool) SOLN   NDC: L6038910, Lot:  [id number], Expiration date: 10/02/2020   Route: Intravitreal, Site: Left Eye, Waste: 0.05 mL  Post-op Post injection exam found visual acuity of at least counting fingers. The patient tolerated the procedure well. There were no complications. The patient received written and verbal post procedure care education. Post injection medications were not given.                 ASSESSMENT/PLAN:    ICD-10-CM   1. Exudative age-related macular degeneration of left eye with active choroidal neovascularization (HCC)  H35.3221 Intravitreal Injection, Pharmacologic Agent - OS - Left Eye    aflibercept (EYLEA) SOLN 2 mg  2. Subretinal hemorrhage of left eye  H35.62 Color Fundus Photography Optos - OU - Both Eyes  3. Serous retinal detachment of left eye  H33.22   4. Retinal edema  H35.81 OCT, Retina - OU - Both Eyes  5. 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 Essential hypertension  I10   8. Hypertensive retinopathy of both eyes  H35.033   9. Pseudophakia of both eyes  Z96.1     1-4. 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 w/ shallow SRF/serous RD  - initially presented with symptomatic floaters OS  - initial exam showed large peripheral temporal subretinal hemorrhage OS -- spanning 2-430 -- also mild vitreous opacities and condensations that were causing symptomatic floaters  - B-scan (9.18.20) showed hyperreflective, ill-defined mass, 0300 periphery OS  - was seen by Dr. Pearletha Furl, Ocular Oncologist, on 9.22.2020 -- no tumor on exam or b-scan  - s/p IVA OS #1 (08.05.20) for subretinal hemorrhage, peripheral CNV  - s/p IVE OS #1 (09.02.20) - sample, #2 (10.12.20), #3 (11.11.20), #4 (12.09.20), #5 (1.20.21), #6 (02.17.21), #7 (03.19.21), #8 (04.30.21), #9 (06.16.21), #10 (08.11.21), #11 (12.01.21), #12 (01.26.22)  - today BCVA decreased to 20/70 from 20/40 OS at 9 wks   - exam shows stable improvement in subretinal heme and shallow  SRF  - repeat color Optos image obtained -- peripheral SRH continues to improve -- very small, white and with surrounding pigment  - OCT shows interval development of central IRF -- CME-like appearance OS at 9 weeks  - will start PF and Prolensa QID OS due to possible CME component  - recommend IVE OS #13 today, 03.30.22 -- w/ f/u in 5 wks  - pt wishes to proceed  - RBA of procedure discussed, questions answered  - informed consent obtained  - Eylea informed consent form signed and scanned on 01.20.2021  - see procedure note  - Eylea4U Benefits investigation initiated 09.02.2020 -- approved for 2021  - f/u in 5 wks, sooner prn, OCT/DFE/optos colors, possible injection  5. Epiretinal membrane, OU.  - mild ERM OU  - asymptomatic, no metamorphopsia  - no indication for surgery at this time  - monitor for now  6. Retinoschisis OD -- stable  - bullous schisis cavity, IT quadrant, from 0700-0830, extends posteriorly almost to arcades  - no associated RT/RD  - stable from prior  - discussed findings and prognosis  - recommend monitoring for now  - if progressing posteriorly, consider intervention  7,8. Hypertensive retinopathy OU  - discussed importance of tight BP control  - monitor  9. Pseudophakia OU  - s/p CE/IOL OU  - beautiful surgery, doing well  - monitor  Ophthalmic Meds Ordered this visit:  Meds ordered this encounter  Medications  . prednisoLONE acetate (PRED FORTE) 1 % ophthalmic suspension    Sig: Place 1 drop into the left eye 4 (four) times daily.    Dispense:  15 mL    Refill:  0  . Bromfenac Sodium (PROLENSA) 0.07 % SOLN    Sig: Place 1 drop into the left eye 4 (four) times daily.    Dispense:  3 mL    Refill:  6  . aflibercept (EYLEA) SOLN 2 mg       Return in about 5 weeks (around 05/06/2020) for f/u exu ARMD OS, DFE, OCT.  There are no Patient Instructions on file for this visit.   Explained the diagnoses, plan, and follow up with the patient and  they expressed understanding.  Patient expressed understanding of the importance of proper follow up care.  This document serves as a record of services personally performed by Karie Chimera, MD, PhD. It was created on their behalf by Annalee Genta, COMT. The creation of this record is the provider's dictation and/or activities during the visit.  Electronically signed by: Annalee Genta, COMT 04/01/20 11:17 PM   This document serves as a record of services personally performed by Karie Chimera, MD, PhD. It was created on their behalf by Glee Arvin. Manson Passey, OA an ophthalmic technician. The creation of this record is the provider's dictation and/or activities during the visit.    Electronically signed by: Glee Arvin. [place], [place] 03.30.2022 11:17 PM  Karie Chimera, M.D., Ph.D. Diseases & Surgery of the Retina and Vitreous [facility]  I have reviewed the above documentation for accuracy and completeness, and I agree with the above. Karie Chimera, M.D., Ph.D. 04/01/20 11:17 PM   Abbreviations: M myopia (nearsighted); A astigmatism; H hyperopia (farsighted); P presbyopia; Mrx spectacle prescription;  CTL contact lenses; OD right eye; OS left eye; OU both eyes  XT exotropia; ET esotropia; PEK punctate epithelial keratitis; PEE punctate epithelial erosions; DES dry eye syndrome; MGD meibomian gland dysfunction; ATs artificial tears; PFAT's preservative free artificial tears; NSC nuclear sclerotic cataract; PSC posterior subcapsular cataract; ERM epi-retinal membrane; PVD posterior vitreous detachment; RD retinal detachment; DM diabetes mellitus; DR diabetic retinopathy; NPDR non-proliferative diabetic retinopathy; PDR proliferative diabetic retinopathy; CSME clinically significant macular edema; DME diabetic macular edema; dbh dot blot hemorrhages; CWS cotton wool spot; POAG primary open angle glaucoma; C/D cup-to-disc ratio; HVF humphrey visual field; GVF goldmann visual field; OCT  optical coherence tomography; IOP intraocular pressure; BRVO Branch retinal vein occlusion; CRVO central retinal vein occlusion; CRAO central retinal artery occlusion; BRAO branch retinal artery occlusion; RT retinal tear; SB scleral buckle; PPV pars plana vitrectomy; VH Vitreous hemorrhage; PRP panretinal laser photocoagulation; IVK intravitreal kenalog; VMT vitreomacular traction; MH Macular hole;  NVD neovascularization of the disc; NVE neovascularization elsewhere; AREDS age related eye disease study; ARMD age related macular degeneration; POAG primary open angle glaucoma; EBMD epithelial/anterior basement membrane dystrophy; ACIOL anterior chamber intraocular lens; IOL intraocular lens; PCIOL posterior chamber intraocular lens; Phaco/IOL phacoemulsification with intraocular lens placement; PRK photorefractive keratectomy; LASIK laser assisted in situ keratomileusis; HTN hypertension; DM diabetes mellitus; COPD chronic obstructive pulmonary disease

## 2020-04-01 ENCOUNTER — Ambulatory Visit (INDEPENDENT_AMBULATORY_CARE_PROVIDER_SITE_OTHER): Payer: Medicare Other | Admitting: Ophthalmology

## 2020-04-01 ENCOUNTER — Other Ambulatory Visit: Payer: Self-pay

## 2020-04-01 ENCOUNTER — Encounter (INDEPENDENT_AMBULATORY_CARE_PROVIDER_SITE_OTHER): Payer: Self-pay | Admitting: Ophthalmology

## 2020-04-01 DIAGNOSIS — H353221 Exudative age-related macular degeneration, left eye, with active choroidal neovascularization: Secondary | ICD-10-CM

## 2020-04-01 DIAGNOSIS — H3581 Retinal edema: Secondary | ICD-10-CM

## 2020-04-01 DIAGNOSIS — I10 Essential (primary) hypertension: Secondary | ICD-10-CM

## 2020-04-01 DIAGNOSIS — H3562 Retinal hemorrhage, left eye: Secondary | ICD-10-CM

## 2020-04-01 DIAGNOSIS — Z961 Presence of intraocular lens: Secondary | ICD-10-CM

## 2020-04-01 DIAGNOSIS — H35373 Puckering of macula, bilateral: Secondary | ICD-10-CM

## 2020-04-01 DIAGNOSIS — H3322 Serous retinal detachment, left eye: Secondary | ICD-10-CM

## 2020-04-01 DIAGNOSIS — H35033 Hypertensive retinopathy, bilateral: Secondary | ICD-10-CM

## 2020-04-01 DIAGNOSIS — H33101 Unspecified retinoschisis, right eye: Secondary | ICD-10-CM

## 2020-04-01 MED ORDER — AFLIBERCEPT 2MG/0.05ML IZ SOLN FOR KALEIDOSCOPE
2.0000 mg | INTRAVITREAL | Status: AC | PRN
  Administered 2020-04-01: 2 mg via INTRAVITREAL

## 2020-04-01 MED ORDER — PREDNISOLONE ACETATE 1 % OP SUSP: 1.0000 [drp] | Freq: Four times a day (QID) | OPHTHALMIC | 0 refills | Status: DC

## 2020-04-01 MED ORDER — PROLENSA 0.07 % OP SOLN: 1.0000 [drp] | Freq: Four times a day (QID) | OPHTHALMIC | 6 refills | Status: DC

## 2020-04-13 ENCOUNTER — Ambulatory Visit (INDEPENDENT_AMBULATORY_CARE_PROVIDER_SITE_OTHER): Payer: Medicare Other | Admitting: Podiatry

## 2020-04-13 ENCOUNTER — Other Ambulatory Visit: Payer: Self-pay

## 2020-04-13 ENCOUNTER — Telehealth: Payer: Self-pay

## 2020-04-13 DIAGNOSIS — G609 Hereditary and idiopathic neuropathy, unspecified: Secondary | ICD-10-CM | POA: Diagnosis not present

## 2020-04-13 MED ORDER — NONFORMULARY OR COMPOUNDED ITEM: 3 refills | Status: DC

## 2020-04-13 NOTE — Patient Instructions (Addendum)
Peripheral Neuropathy Cream: Bupivacaine 1%, Doxepin 3%, Gabapentin 6%, Pentoxifylline 3%, Topiramate 1% Order faxed to [facility]   Talk to your primary care doctor about the drug Lyrica for neuropathy   Peripheral Neuropathy Peripheral neuropathy is a type of nerve damage. It affects nerves that carry signals between the spinal cord and the arms, legs, and the rest of the body (peripheral nerves). It does not affect nerves in the spinal cord or brain. In peripheral neuropathy, one nerve or a group of nerves may be damaged. Peripheral neuropathy is a broad category that includes many specific nerve disorders, like diabetic neuropathy, hereditary neuropathy, and carpal tunnel syndrome. What are the causes? This condition may be caused by:  Diabetes. This is the most common cause of peripheral neuropathy.  Nerve injury.  Pressure or stress on a nerve that lasts a long time.  Lack (deficiency) of B vitamins. This can result from alcoholism, poor diet, or a restricted diet.  Infections.  Autoimmune diseases, such as rheumatoid arthritis and systemic lupus erythematosus.  Nerve diseases that are passed from parent to child (inherited).  Some medicines, such as cancer medicines (chemotherapy).  Poisonous (toxic) substances, such as lead and mercury.  Too little blood flowing to the legs.  Kidney disease.  Thyroid disease. In some cases, the cause of this condition is not known. What are the signs or symptoms? Symptoms of this condition depend on which of your nerves is damaged. Common symptoms include:  Loss of feeling (numbness) in the feet, hands, or both.  Tingling in the feet, hands, or both.  Burning pain.  Very sensitive skin.  Weakness.  Not being able to move a part of the body (paralysis).  Muscle twitching.  Clumsiness or poor coordination.  Loss of balance.  Not being able to control your bladder.  Feeling dizzy.  Sexual problems. How is  this diagnosed? Diagnosing and finding the cause of peripheral neuropathy can be difficult. Your health care provider will take your medical history and do a physical exam. A neurological exam will also be done. This involves checking things that are affected by your brain, spinal cord, and nerves (nervous system). For example, your health care provider will check your reflexes, how you move, and what you can feel. You may have other tests, such as:  Blood tests.  Electromyogram (EMG) and nerve conduction tests. These tests check nerve function and how well the nerves are controlling the muscles.  Imaging tests, such as CT scans or MRI to rule out other causes of your symptoms.  Removing a small piece of nerve to be examined in a lab (nerve biopsy).  Removing and examining a small amount of the fluid that surrounds the brain and spinal cord (lumbar puncture). How is this treated? Treatment for this condition may involve:  Treating the underlying cause of the neuropathy, such as diabetes, kidney disease, or vitamin deficiencies.  Stopping medicines that can cause neuropathy, such as chemotherapy.  Medicine to help relieve pain. Medicines may include: ? Prescription or over-the-counter pain medicine. ? Antiseizure medicine. ? Antidepressants. ? Pain-relieving patches that are applied to painful areas of skin.  Surgery to relieve pressure on a nerve or to destroy a nerve that is causing pain.  Physical therapy to help improve movement and balance.  Devices to help you move around (assistive devices). Follow these instructions at home: Medicines  Take over-the-counter and prescription medicines only as told by your health care provider. Do not take any other medicines without first  asking your health care provider.  Do not drive or use heavy machinery while taking prescription pain medicine. Lifestyle  Do not use any products that contain nicotine or tobacco, such as cigarettes and  e-cigarettes. Smoking keeps blood from reaching damaged nerves. If you need help quitting, ask your health care provider.  Avoid or limit alcohol. Too much alcohol can cause a vitamin B deficiency, and vitamin B is needed for healthy nerves.  Eat a healthy diet. This includes: ? Eating foods that are high in fiber, such as fresh fruits and vegetables, whole grains, and beans. ? Limiting foods that are high in fat and processed sugars, such as fried or sweet foods.   General instructions  If you have diabetes, work closely with your health care provider to keep your blood sugar under control.  If you have numbness in your feet: ? Check every day for signs of injury or infection. Watch for redness, warmth, and swelling. ? Wear padded socks and comfortable shoes. These help protect your feet.  Develop a good support system. Living with peripheral neuropathy can be stressful. Consider talking with a mental health specialist or joining a support group.  Use assistive devices and attend physical therapy as told by your health care provider. This may include using a walker or a cane.  Keep all follow-up visits as told by your health care provider. This is important.   Contact a health care provider if:  You have new signs or symptoms of peripheral neuropathy.  You are struggling emotionally from dealing with peripheral neuropathy.  Your pain is not well-controlled. Get help right away if:  You have an injury or infection that is not healing normally.  You develop new weakness in an arm or leg.  You have fallen or do so frequently. Summary  Peripheral neuropathy is when the nerves in the arms, or legs are damaged, resulting in numbness, weakness, or pain.  There are many causes of peripheral neuropathy, including diabetes, pinched nerves, vitamin deficiencies, autoimmune disease, and hereditary conditions.  Diagnosing and finding the cause of peripheral neuropathy can be difficult.  Your health care provider will take your medical history, do a physical exam, and do tests, including blood tests and nerve function tests.  Treatment involves treating the underlying cause of the neuropathy and taking medicines to help control pain. Physical therapy and assistive devices may also help. This information is not intended to replace advice given to you by your health care provider. Make sure you discuss any questions you have with your health care provider. Document Revised: 10/01/2019 Document Reviewed: 10/01/2019 Elsevier Patient Education  2021 [employer].

## 2020-04-13 NOTE — Telephone Encounter (Signed)
Prescription for peripheral neuropathy cream faxed to [facility] 

## 2020-04-14 ENCOUNTER — Encounter: Payer: Self-pay | Admitting: Podiatry

## 2020-04-14 NOTE — Progress Notes (Signed)
  Subjective:  Patient ID: Tracey Rosario, female    DOB: [birth year],  MRN: [id number]  Chief Complaint  Patient presents with  . Peripheral Neuropathy     (np) bil neuropathy    87 y.o. female presents with the above complaint. History confirmed with patient.  Her feet often feel cold.  She had a previous doctor who "did nothing to help her".  She has been taking gabapentin 300 mg 3 times daily, they tried to increase her to 4 times daily and this was not tolerable from side effects  Objective:  Physical Exam: warm, good capillary refill, no trophic changes or ulcerative lesions and normal DP and PT pulses.  She does have varicose veins and venous insufficiency.  Decreased light touch sensation bilaterally Assessment:   1. Idiopathic peripheral neuropathy      Plan:  Patient was evaluated and treated and all questions answered.  Discussed with her the etiology and treatment options for idiopathic neuropathy.  I discussed with her that is not something I primarily manage.  I did offer her a topical compounded cream which I sent a prescription to [facility] for.  I encouraged her discuss with her primary care doctor regarding a transition to Lyrica to see if this helps.  Neurology consultation may also be helpful  Return if symptoms worsen or fail to improve.

## 2020-04-30 ENCOUNTER — Telehealth: Payer: Self-pay | Admitting: Podiatry

## 2020-04-30 NOTE — Telephone Encounter (Signed)
Patient daughter called our office stating Dr.mcdonald was suppose to order a compound cream for her but they haven't heard anything about the cream.

## 2020-04-30 NOTE — Telephone Encounter (Signed)
Heather faxed it to [facility]. They should be able to check with them on the status

## 2020-05-05 NOTE — Progress Notes (Signed)
[facility] - Clinic Note  05/06/2020     CHIEF COMPLAINT Patient presents for Retina Follow Up   HISTORY OF PRESENT ILLNESS: Tracey Rosario is a 87 y.o. female who presents to the clinic today for:   HPI    Retina Follow Up    Patient presents with  Wet AMD.  In left eye.  Severity is moderate.  Since onset it is stable.  I, the attending physician,  performed the HPI with the patient and updated documentation appropriately.          Comments    Pt states she can tell her vision "needs something", but doesn't feel that it's worse than when she was here last, she denies fol or floaters, taking Restasis BID OU, PF QID OD and Prolensa QID OD       Last edited by Rennis Chris, MD on 05/06/2020  1:28 PM. (History)    pt states    Referring physician: Courtney Paris, NP [address] [place],  [place] [address]  HISTORICAL INFORMATION:   Selected notes from the MEDICAL RECORD NUMBER Referred by Dr. Baker Pierini for retinal eval   CURRENT MEDICATIONS: Current Outpatient Medications (Ophthalmic Drugs)  Medication Sig  . Bromfenac Sodium (PROLENSA) 0.07 % SOLN Place 1 drop into the left eye 4 (four) times daily.  . polyvinyl alcohol (LIQUIFILM TEARS) 1.4 % ophthalmic solution Place 1 drop into both eyes as needed for dry eyes.  . prednisoLONE acetate (PRED FORTE) 1 % ophthalmic suspension Place 1 drop into the left eye 4 (four) times daily.  . RESTASIS 0.05 % ophthalmic emulsion    No current facility-administered medications for this visit. (Ophthalmic Drugs)   Current Outpatient Medications (Other)  Medication Sig  . ALPRAZolam (XANAX) 0.25 MG tablet   . amLODipine (NORVASC) 10 MG tablet Take 1 tablet by mouth daily.  Marland Kitchen amLODipine (NORVASC) 5 MG tablet Take 5 mg by mouth daily.  Marland Kitchen aspirin EC 81 MG tablet Take 1 tablet (81 mg total) by mouth daily.  . Biotin 1000 MCG tablet Take by mouth.  . Cholecalciferol (VITAMIN D3) 125 MCG (5000 UT) TABS  Take 5,000 Units by mouth daily.   . citalopram (CELEXA) 20 MG tablet Take 20 mg by mouth daily.  Marland Kitchen COVID-19 mRNA vaccine, Pfizer, 30 MCG/0.3ML injection   . donepezil (ARICEPT) 10 MG tablet Take 10 mg by mouth daily.  Marland Kitchen estradiol (ESTRACE) 0.1 MG/GM vaginal cream   . ferrous gluconate (FERGON) 324 MG tablet Take 324 mg by mouth daily.  Marland Kitchen gabapentin (NEURONTIN) 100 MG capsule Take 200 mg by mouth 2 (two) times daily.  Marland Kitchen levothyroxine (SYNTHROID) 50 MCG tablet Take 50 mcg by mouth daily before breakfast.  . levothyroxine (SYNTHROID) 75 MCG tablet Take 75 mcg by mouth daily.  Marland Kitchen lisinopril (PRINIVIL,ZESTRIL) 30 MG tablet TAKE 1 TABLET DAILY (NEED AN APPOINTMENT)  . meloxicam (MOBIC) 7.5 MG tablet Take 7.5 mg by mouth daily.   . methocarbamol (ROBAXIN) 500 MG tablet Take 1 tablet (500 mg total) by mouth 2 (two) times daily as needed.  . Multiple Vitamins-Minerals (CENTRUM SILVER) tablet Take 1 tablet by mouth daily.  . NONFORMULARY OR COMPOUNDED ITEM Peripheral Neuropathy Cream: Bupivacaine 1%, Doxepin 3%, Gabapentin 6%, Pentoxifylline 3%, Topiramate 1% Order faxed to [facility]  . nystatin (MYCOSTATIN) 100000 UNIT/ML suspension   . omeprazole (PRILOSEC) 20 MG capsule TAKE 1 CAPSULE DAILY  . pravastatin (PRAVACHOL) 20 MG tablet   . predniSONE (STERAPRED UNI-PAK  21 TAB) 5 MG (21) TBPK tablet   . Probiotic Product (PROBIOTIC PO) Take 1 tablet by mouth daily.    No current facility-administered medications for this visit. (Other)      REVIEW OF SYSTEMS: ROS    Positive for: Musculoskeletal, Cardiovascular, Eyes, Heme/Lymph   Negative for: Constitutional, Gastrointestinal, Neurological, Skin, Genitourinary, HENT, Endocrine, Respiratory, Psychiatric, Allergic/Imm   Last edited by Posey BoyerBrown, Amanda J, COT on 05/06/2020  1:20 PM. (History)       ALLERGIES Allergies  Allergen Reactions  . Codeine Other (See Comments)    Makes her crazy    PAST MEDICAL HISTORY Past Medical History:   Diagnosis Date  . Acid reflux   . Anemia   . Arthropathy   . Bradycardia    beta blocker reduced in 2021 due to this  . CKD (chronic kidney disease), stage III (HCC)   . Cognitive decline    referred to neurologist  . Depression 10/07/2013   daughter didn't know anything about this  . Dizziness   . Frequent PVCs   . GERD (gastroesophageal reflux disease) 05/04/2013  . Hip fx (HCC)    r hip, bilat pubis rami  . Hyperlipemia   . Hypertension   . Hypertensive retinopathy    OU  . Hypothyroidism   . Insomnia   . Macular degeneration    Exu OS  . Multilevel degenerative disc disease   . Palpitations 05/04/2013   saw cardiologist in 2015, he deleted A. Fib from the record as per note not documentation to support  . Premature atrial contractions   . PSVT (paroxysmal supraventricular tachycardia) (HCC)    a. short runs by monitor in 2021.  Marland Kitchen. Short-term memory loss   . Vitamin D deficiency    Past Surgical History:  Procedure Laterality Date  . ABDOMINAL HYSTERECTOMY  1971  . BLADDER SURGERY  2010  . Broken Pelvis  2015  . C-EYE SURGERY PROCEDURE Bilateral    zaldivar  . CATARACT EXTRACTION Bilateral    Done in [place]  . EYE SURGERY Bilateral    Cat Sx  . HIP PINNING,CANNULATED Right 10/07/2013   Procedure: CANNULATED screws right hip;  Surgeon: Kathryne Hitchhristopher Y Blackman, MD;  [facility];  Service: Orthopedics;  Laterality: Right;    FAMILY HISTORY Family History  Problem Relation Age of Onset  . Prostate cancer Father   . Heart attack Sister   . Cancer Brother   . Cancer Sister   . Heart attack Sister   . Cancer Sister   . Stroke Daughter   . Dementia Neg Hx   . Alzheimer's disease Neg Hx     SOCIAL HISTORY Social History   Tobacco Use  . Smoking status: Former Smoker    Types: Cigarettes    Quit date: 08/02/1970    Years since quitting: 49.8  . Smokeless tobacco: Never Used  . Tobacco comment: "smoked very little"  Vaping Use  . Vaping Use: Never used   Substance Use Topics  . Alcohol use: Never    Alcohol/week: 0.0 standard drinks  . Drug use: Never         OPHTHALMIC EXAM:  Base Eye Exam    Visual Acuity (Snellen - Linear)      Right Left   Dist [place] 20/30 +2 20/50 +1   Dist ph [place] NI NI       Tonometry (Tonopen, 1:14 PM)      Right Left   Pressure 13 09  Pupils      Dark Light Shape React APD   Right 2 1.5 Round Minimal None   Left 2 1.5 Round Minimal None       Visual Fields (Counting fingers)      Left Right    Full Full       Extraocular Movement      Right Left    Full, Ortho Full, Ortho       Neuro/Psych    Oriented x3: Yes   Mood/Affect: Normal       Dilation    Both eyes: 1.0% Mydriacyl, 2.5% Phenylephrine @ 1:14 PM        Slit Lamp and Fundus Exam    Slit Lamp Exam      Right Left   Lids/Lashes Dermatochalasis - upper lid Dermatochalasis - upper lid   Conjunctiva/Sclera White and quiet White and quiet   Cornea Arcus, 1-2+ Punctate epithelial erosions, mild EBMD and corneal haze Arcus, 2-3+ Punctate epithelial erosions, EBMD, irregular epi   Anterior Chamber Deep and quiet Deep and quiet   Iris Round and dilated Round and moderately dilated to 91mm   Lens PC IOL in good position with open PC, anterior capsular phimosis PC IOL in good position with open PC   Vitreous Vitreous syneresis, Posterior vitreous detachment Vitreous syneresis; PVD; vitreous condensations       Fundus Exam      Right Left   Disc Mild temporal Pallor, Peripapillary atrophy, Sharp rim, Compact mild tilt, 2+ temporal Pallor, Peripapillary atrophy, Compact, Sharp rim   C/D Ratio 0.1 0.0   Macula Flat, Blunted foveal reflex, mild Epiretinal membrane, drusen, RPE mottling and clumping, No heme or edema Flat, Blunted foveal reflex, RPE mottling and clumping, Epiretinal membrane, interval increase in central cystic changes, No heme    Vessels Vascular attenuation, Tortuous attenuated, mild tortuousity   Periphery bullous  IT schisis cavity from 0700-0830 extending posteriorly almost to arcades otherwise attached -- appears slightly less bullous, focal pigmented CR scar at 0630 persistent temporal sub-retinal hemorrhage - now white with surrounding pigment clumping;  No SRF -- completely resolved, new focal, punctate IRH overlying, shallow schisis IT quad          IMAGING AND PROCEDURES  Imaging and Procedures for @TODAY @  OCT, Retina - OU - Both Eyes       Right Eye Quality was good. Central Foveal Thickness: 346. Progression has been stable. Findings include epiretinal membrane, normal foveal contour, no IRF, no SRF, retinal drusen , pigment epithelial detachment, macular pucker (stable ERM; bullous schisis IT quad caught on widefield).   Left Eye Quality was good. Central Foveal Thickness: 312. Progression has improved. Findings include epiretinal membrane, retinal drusen , preretinal fibrosis, abnormal foveal contour, no SRF, no IRF (Interval resolution of central CME-like appearance, persistent ERM with PRF).   Notes *Images captured and stored on drive  Diagnosis / Impression:  OD: Stable ERM & retinal drusen OS: Interval resolution of central CME, persistent ERM with PRF  Clinical management:  See below  Abbreviations: NFP - Normal foveal profile. CME - cystoid macular edema. PED - pigment epithelial detachment. IRF - intraretinal fluid. SRF - subretinal fluid. EZ - ellipsoid zone. ERM - epiretinal membrane. ORA - outer retinal atrophy. ORT - outer retinal tubulation. SRHM - subretinal hyper-reflective material                 ASSESSMENT/PLAN:    ICD-10-CM   1. Exudative age-related macular degeneration of left  eye with active choroidal neovascularization (HCC)  H35.3221   2. Subretinal hemorrhage of left eye  H35.62   3. Serous retinal detachment of left eye  H33.22   4. Retinal edema  H35.81 OCT, Retina - OU - Both Eyes  5. 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 Essential hypertension  I10   8. Hypertensive retinopathy of both eyes  H35.033   9. Pseudophakia of both eyes  Z96.1     1-4. 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 w/ shallow SRF/serous RD  - initially presented with symptomatic floaters OS  - initial exam showed large peripheral temporal subretinal hemorrhage OS -- spanning 2-430 -- also mild vitreous opacities and condensations that were causing symptomatic floaters  - B-scan (9.18.20) showed hyperreflective, ill-defined mass, 0300 periphery OS  - was seen by Dr. Pearletha Furl, Ocular Oncologist, on 9.22.2020 -- no tumor on exam or b-scan  - s/p IVA OS #1 (08.05.20) for subretinal hemorrhage, peripheral CNV  - s/p IVE OS #1 (09.02.20) - sample, #2 (10.12.20), #3 (11.11.20), #4 (12.09.20), #5 (1.20.21), #6 (02.17.21), #7 (03.19.21), #8 (04.30.21), #9 (06.16.21), #10 (08.11.21), #11 (12.01.21), #12 (01.26.22), #13 (03.30.22)  - today BCVA improved to 20/50 from 20/70   - exam shows stable improvement in subretinal heme and shallow SRF  - repeat color Optos image obtained -- peripheral SRH continues to improve -- very small, white and with surrounding pigment  - OCT shows Interval resolution of central CME (on PF and Prolensa), persistent ERM with PRF  - will stop Prolensa, start PF taper -- 3,2,1 drops daily, decrease weekly, stay at once daily until next f/u  - Eyle[employer] informed consent form signed and scanned on 01.20.2021  - [employer] Benefits investigation initiated 09.02.2020 -- approved for 2021  - f/u in 3-4 wks, sooner prn, OCT/DFE/optos colors, possible injection  5. Epiretinal membrane, OU.  - mild ERM OU  - asymptomatic, no metamorphopsia  - no indication for surgery at this time  - monitor for now  6. Retinoschisis OD -- stable  - bullous schisis cavity, IT quadrant, from 0700-0830, extends posteriorly almost to arcades  - no associated RT/RD  - stable from prior  - discussed  findings and prognosis  - recommend monitoring for now  - if progressing posteriorly, consider intervention  7,8. Hypertensive retinopathy OU  - discussed importance of tight BP control  - monitor  9. Pseudophakia OU  - s/p CE/IOL OU  - beautiful surgery, doing well  - monitor   Ophthalmic Meds Ordered this visit:  No orders of the defined types were placed in this encounter.      Return for f/u 3-4 weeks, exu ARMD OS, DFE, OCT.  There are no Patient Instructions on file for this visit.   Explained the diagnoses, plan, and follow up with the patient and they expressed understanding.  Patient expressed understanding of the importance of proper follow up care.  This document serves as a record of services personally performed by Karie Chimera, MD, PhD. It was created on their behalf by Annalee Genta, COMT. The creation of this record is the provider's dictation and/or activities during the visit.  Electronically signed by: Annalee Genta, COMT 05/08/20 10:42 PM   This document serves as a record of services personally performed by Karie Chimera, MD, PhD. It was created on their behalf by Glee Arvin. Manson Passey, OA an ophthalmic technician. The creation of this  record is the provider's dictation and/or activities during the visit.    Electronically signed by: Glee Arvin. Manson Passey, [place] 05.04.2022 10:42 PM  Karie Chimera, M.D., Ph.D. Diseases & Surgery of the Retina and Vitreous [facility]  I have reviewed the above documentation for accuracy and completeness, and I agree with the above. Karie Chimera, M.D., Ph.D. 05/08/20 10:42 PM   Abbreviations: M myopia (nearsighted); A astigmatism; H hyperopia (farsighted); P presbyopia; Mrx spectacle prescription;  CTL contact lenses; OD right eye; OS left eye; OU both eyes  XT exotropia; ET esotropia; PEK punctate epithelial keratitis; PEE punctate epithelial erosions; DES dry eye syndrome; MGD meibomian gland dysfunction;  ATs artificial tears; PFAT's preservative free artificial tears; NSC nuclear sclerotic cataract; PSC posterior subcapsular cataract; ERM epi-retinal membrane; PVD posterior vitreous detachment; RD retinal detachment; DM diabetes mellitus; DR diabetic retinopathy; NPDR non-proliferative diabetic retinopathy; PDR proliferative diabetic retinopathy; CSME clinically significant macular edema; DME diabetic macular edema; dbh dot blot hemorrhages; CWS cotton wool spot; POAG primary open angle glaucoma; C/D cup-to-disc ratio; HVF humphrey visual field; GVF goldmann visual field; OCT optical coherence tomography; IOP intraocular pressure; BRVO Branch retinal vein occlusion; CRVO central retinal vein occlusion; CRAO central retinal artery occlusion; BRAO branch retinal artery occlusion; RT retinal tear; SB scleral buckle; PPV pars plana vitrectomy; VH Vitreous hemorrhage; PRP panretinal laser photocoagulation; IVK intravitreal kenalog; VMT vitreomacular traction; MH Macular hole;  NVD neovascularization of the disc; NVE neovascularization elsewhere; AREDS age related eye disease study; ARMD age related macular degeneration; POAG primary open angle glaucoma; EBMD epithelial/anterior basement membrane dystrophy; ACIOL anterior chamber intraocular lens; IOL intraocular lens; PCIOL posterior chamber intraocular lens; Phaco/IOL phacoemulsification with intraocular lens placement; PRK photorefractive keratectomy; LASIK laser assisted in situ keratomileusis; HTN hypertension; DM diabetes mellitus; COPD chronic obstructive pulmonary disease

## 2020-05-06 ENCOUNTER — Ambulatory Visit (INDEPENDENT_AMBULATORY_CARE_PROVIDER_SITE_OTHER): Payer: Medicare Other | Admitting: Ophthalmology

## 2020-05-06 ENCOUNTER — Other Ambulatory Visit: Payer: Self-pay

## 2020-05-06 ENCOUNTER — Encounter (INDEPENDENT_AMBULATORY_CARE_PROVIDER_SITE_OTHER): Payer: Self-pay | Admitting: Ophthalmology

## 2020-05-06 DIAGNOSIS — H3562 Retinal hemorrhage, left eye: Secondary | ICD-10-CM

## 2020-05-06 DIAGNOSIS — I10 Essential (primary) hypertension: Secondary | ICD-10-CM

## 2020-05-06 DIAGNOSIS — H3581 Retinal edema: Secondary | ICD-10-CM | POA: Diagnosis not present

## 2020-05-06 DIAGNOSIS — H3322 Serous retinal detachment, left eye: Secondary | ICD-10-CM

## 2020-05-06 DIAGNOSIS — H35373 Puckering of macula, bilateral: Secondary | ICD-10-CM

## 2020-05-06 DIAGNOSIS — Z961 Presence of intraocular lens: Secondary | ICD-10-CM

## 2020-05-06 DIAGNOSIS — H353221 Exudative age-related macular degeneration, left eye, with active choroidal neovascularization: Secondary | ICD-10-CM | POA: Diagnosis not present

## 2020-05-06 DIAGNOSIS — H35033 Hypertensive retinopathy, bilateral: Secondary | ICD-10-CM

## 2020-05-06 DIAGNOSIS — H33101 Unspecified retinoschisis, right eye: Secondary | ICD-10-CM

## 2020-06-02 NOTE — Progress Notes (Signed)
[facility] - Clinic Note  06/03/2020     CHIEF COMPLAINT Patient presents for Retina Follow Up   HISTORY OF PRESENT ILLNESS: Tracey Rosario is a 87 y.o. female who presents to the clinic today for:   HPI    Retina Follow Up    Patient presents with  Wet AMD.  In left eye.  Duration of 4 weeks.  Since onset it is stable.  I, the attending physician,  performed the HPI with the patient and updated documentation appropriately.          Comments    4 week follow up Exu ARMD OS-  Doing well.  Patient is aware when it is time for her appt because her vision gets a little blurry OS.  Using Prednisolone qd OS and Restasis BID OU.        Last edited by Rennis Chris, MD on 06/03/2020 12:14 PM. (History)    pt states    Referring physician: Courtney Paris, NP [address] [place],  [place] [address]  HISTORICAL INFORMATION:   Selected notes from the MEDICAL RECORD NUMBER Referred by Dr. Baker Pierini for retinal eval   CURRENT MEDICATIONS: Current Outpatient Medications (Ophthalmic Drugs)  Medication Sig  . polyvinyl alcohol (LIQUIFILM TEARS) 1.4 % ophthalmic solution Place 1 drop into both eyes as needed for dry eyes.  . prednisoLONE acetate (PRED FORTE) 1 % ophthalmic suspension Place 1 drop into the left eye 4 (four) times daily.  . RESTASIS 0.05 % ophthalmic emulsion   . Bromfenac Sodium (PROLENSA) 0.07 % SOLN Place 1 drop into the left eye 4 (four) times daily. (Patient not taking: Reported on 06/03/2020)   No current facility-administered medications for this visit. (Ophthalmic Drugs)   Current Outpatient Medications (Other)  Medication Sig  . ALPRAZolam (XANAX) 0.25 MG tablet   . amLODipine (NORVASC) 10 MG tablet Take 1 tablet by mouth daily.  Marland Kitchen aspirin EC 81 MG tablet Take 1 tablet (81 mg total) by mouth daily.  . Biotin 1000 MCG tablet Take by mouth.  . Cholecalciferol (VITAMIN D3) 125 MCG (5000 UT) TABS Take 5,000 Units by mouth daily.   .  citalopram (CELEXA) 20 MG tablet Take 20 mg by mouth daily.  Marland Kitchen donepezil (ARICEPT) 10 MG tablet Take 10 mg by mouth daily.  Marland Kitchen estradiol (ESTRACE) 0.1 MG/GM vaginal cream   . ferrous gluconate (FERGON) 324 MG tablet Take 324 mg by mouth daily.  Marland Kitchen gabapentin (NEURONTIN) 100 MG capsule Take 200 mg by mouth 2 (two) times daily.  Marland Kitchen levothyroxine (SYNTHROID) 50 MCG tablet Take 50 mcg by mouth daily before breakfast.  . levothyroxine (SYNTHROID) 75 MCG tablet Take 75 mcg by mouth daily.  Marland Kitchen lisinopril (PRINIVIL,ZESTRIL) 30 MG tablet TAKE 1 TABLET DAILY (NEED AN APPOINTMENT)  . meloxicam (MOBIC) 7.5 MG tablet Take 7.5 mg by mouth daily.   . methocarbamol (ROBAXIN) 500 MG tablet Take 1 tablet (500 mg total) by mouth 2 (two) times daily as needed.  . Multiple Vitamins-Minerals (CENTRUM SILVER) tablet Take 1 tablet by mouth daily.  . NONFORMULARY OR COMPOUNDED ITEM Peripheral Neuropathy Cream: Bupivacaine 1%, Doxepin 3%, Gabapentin 6%, Pentoxifylline 3%, Topiramate 1% Order [facility]  . nystatin (MYCOSTATIN) 100000 UNIT/ML suspension   . omeprazole (PRILOSEC) 20 MG capsule TAKE 1 CAPSULE DAILY  . pravastatin (PRAVACHOL) 20 MG tablet   . Probiotic Product (PROBIOTIC PO) Take 1 tablet by mouth daily.   Marland Kitchen amLODipine (NORVASC) 5 MG tablet  Take 5 mg by mouth daily.  Marland Kitchen. COVID-19 mRNA vaccine, Pfizer, 30 MCG/0.3ML injection   . predniSONE (STERAPRED UNI-PAK 21 TAB) 5 MG (21) TBPK tablet  (Patient not taking: Reported on 06/03/2020)   No current facility-administered medications for this visit. (Other)      REVIEW OF SYSTEMS: ROS    Positive for: Musculoskeletal, Cardiovascular, Eyes, Heme/Lymph   Negative for: Constitutional, Gastrointestinal, Neurological, Skin, Genitourinary, HENT, Endocrine, Respiratory, Psychiatric, Allergic/Imm   Last edited by Joni ReiningHodges, Robin, COA on 06/03/2020  9:50 AM. (History)       ALLERGIES Allergies  Allergen Reactions  . Codeine Other (See Comments)     Makes her crazy    PAST MEDICAL HISTORY Past Medical History:  Diagnosis Date  . Acid reflux   . Anemia   . Arthropathy   . Bradycardia    beta blocker reduced in 2021 due to this  . CKD (chronic kidney disease), stage III (HCC)   . Cognitive decline    referred to neurologist  . Depression 10/07/2013   daughter didn't know anything about this  . Dizziness   . Frequent PVCs   . GERD (gastroesophageal reflux disease) 05/04/2013  . Hip fx (HCC)    r hip, bilat pubis rami  . Hyperlipemia   . Hypertension   . Hypertensive retinopathy    OU  . Hypothyroidism   . Insomnia   . Macular degeneration    Exu OS  . Multilevel degenerative disc disease   . Palpitations 05/04/2013   saw cardiologist in 2015, he deleted A. Fib from the record as per note not documentation to support  . Premature atrial contractions   . PSVT (paroxysmal supraventricular tachycardia) (HCC)    a. short runs by monitor in 2021.  Marland Kitchen. Short-term memory loss   . Vitamin D deficiency    Past Surgical History:  Procedure Laterality Date  . ABDOMINAL HYSTERECTOMY  1971  . BLADDER SURGERY  2010  . Broken Pelvis  2015  . C-EYE SURGERY PROCEDURE Bilateral    zaldivar  . CATARACT EXTRACTION Bilateral    Done in [place]  . EYE SURGERY Bilateral    Cat Sx  . HIP PINNING,CANNULATED Right 10/07/2013   Procedure: CANNULATED screws right hip;  Surgeon: Kathryne Hitchhristopher Y Blackman, MD;  [facility];  Service: Orthopedics;  Laterality: Right;    FAMILY HISTORY Family History  Problem Relation Age of Onset  . Prostate cancer Father   . Heart attack Sister   . Cancer Brother   . Cancer Sister   . Heart attack Sister   . Cancer Sister   . Stroke Daughter   . Dementia Neg Hx   . Alzheimer's disease Neg Hx     SOCIAL HISTORY Social History   Tobacco Use  . Smoking status: Former Smoker    Types: Cigarettes    Quit date: 08/02/1970    Years since quitting: 49.8  . Smokeless tobacco: Never Used  . Tobacco  comment: "smoked very little"  Vaping Use  . Vaping Use: Never used  Substance Use Topics  . Alcohol use: Never    Alcohol/week: 0.0 standard drinks  . Drug use: Never         OPHTHALMIC EXAM:  Base Eye Exam    Visual Acuity (Snellen - Linear)      Right Left   Dist [place] 20/25 -2 20/50 +1   Dist ph [place] NI NI       Tonometry (Tonopen, 9:57 AM)  Right Left   Pressure 13 13       Pupils      Dark Light Shape React APD   Right 3 2 Round Minimal None   Left 3 2 Round Minimal None       Visual Fields (Counting fingers)      Left Right    Full Full       Extraocular Movement      Right Left    Full Full       Neuro/Psych    Oriented x3: Yes   Mood/Affect: Normal       Dilation    Both eyes: 1.0% Mydriacyl, 2.5% Phenylephrine @ 9:57 AM        Slit Lamp and Fundus Exam    Slit Lamp Exam      Right Left   Lids/Lashes Dermatochalasis - upper lid Dermatochalasis - upper lid   Conjunctiva/Sclera White and quiet White and quiet   Cornea Arcus, 1-2+ Punctate epithelial erosions, mild EBMD and corneal haze Arcus, 2-3+ Punctate epithelial erosions, EBMD, irregular epi   Anterior Chamber Deep and quiet Deep and quiet   Iris Round and dilated Round and moderately dilated to 61mm   Lens PC IOL in good position with open PC, anterior capsular phimosis PC IOL in good position with open PC   Vitreous Vitreous syneresis, Posterior vitreous detachment Vitreous syneresis; PVD; vitreous condensations       Fundus Exam      Right Left   Disc Mild temporal Pallor, Peripapillary atrophy, Sharp rim, Compact mild tilt, 2+ temporal Pallor, Peripapillary atrophy, Compact, Sharp rim   C/D Ratio 0.1 0.0   Macula Flat, Blunted foveal reflex, mild Epiretinal membrane, drusen, RPE mottling and clumping, No heme or edema Flat, Blunted foveal reflex, RPE mottling and clumping, Epiretinal membrane, interval increase in central cystic changes, No heme    Vessels Vascular attenuation,  Tortuous attenuated, mild tortuousity   Periphery bullous IT schisis cavity from 0700-0830 extending posteriorly almost to arcades otherwise attached -- appears slightly less bullous, focal pigmented CR scar at 0630 persistent temporal sub-retinal hemorrhage - now white with surrounding pigment clumping;  No SRF -- completely resolved, focal, punctate IRH - improved, shallow schisis IT quad          IMAGING AND PROCEDURES  Imaging and Procedures for @TODAY @  OCT, Retina - OU - Both Eyes       Right Eye Quality was good. Central Foveal Thickness: 339. Progression has been stable. Findings include epiretinal membrane, normal foveal contour, no IRF, no SRF, retinal drusen , pigment epithelial detachment, macular pucker (stable ERM; bullous schisis IT quad caught on widefield).   Left Eye Quality was good. Central Foveal Thickness: 333. Progression has worsened. Findings include epiretinal membrane, retinal drusen , preretinal fibrosis, abnormal foveal contour, no SRF, intraretinal fluid (Interval re-development of central cystic changes, persistent ERM with PRF).   Notes *Images captured and stored on drive  Diagnosis / Impression:  OD: Stable ERM & retinal drusen OS: Interval re-development of central cystic changes, persistent ERM with PRF  Clinical management:  See below  Abbreviations: NFP - Normal foveal profile. CME - cystoid macular edema. PED - pigment epithelial detachment. IRF - intraretinal fluid. SRF - subretinal fluid. EZ - ellipsoid zone. ERM - epiretinal membrane. ORA - outer retinal atrophy. ORT - outer retinal tubulation. SRHM - subretinal hyper-reflective material        Color Fundus Photography Optos - OU - Both Eyes  Right Eye Progression has been stable. Disc findings include normal observations. Macula : retinal pigment epithelium abnormalities. Vessels : attenuated, tortuous vessels. Periphery : RPE abnormality (IT schisis -- stable from prior).    Left Eye Progression has been stable. Disc findings include normal observations. Macula : normal observations. Vessels : attenuated. Periphery : RPE abnormality, hemorrhage (improvement in sub retinal hemorrhage temporally (0200) -- SRH now white and pigment clumping surrounding).   Notes **Images stored on drive**  Impression: OD: bullous retinoschisis, IT periphery -- stable from prior OS: PEHCR with stably improved sub retinal hemorrhage temporally (0200)         Intravitreal Injection, Pharmacologic Agent - OS - Left Eye       Time Out 06/03/2020. 11:10 AM. Confirmed correct patient, procedure, site, and patient consented.   Anesthesia Topical anesthesia was used. Anesthetic medications included Lidocaine 2%, Proparacaine 0.5%.   Procedure Preparation included 5% betadine to ocular surface, eyelid speculum. A (32g) needle was used.   Injection:  2 mg aflibercept Gretta Cool) SOLN   NDC: L6038910, Lot: [id number], Expiration date: 02/03/2021   Route: Intravitreal, Site: Left Eye, Waste: 0.05 mL  Post-op Post injection exam found visual acuity of at least counting fingers. The patient tolerated the procedure well. There were no complications. The patient received written and verbal post procedure care education. Post injection medications were not given.                 ASSESSMENT/PLAN:    ICD-10-CM   1. Exudative age-related macular degeneration of left eye with active choroidal neovascularization (HCC)  H35.3221 Intravitreal Injection, Pharmacologic Agent - OS - Left Eye    aflibercept (EYLEA) SOLN 2 mg  2. Subretinal hemorrhage of left eye  H35.62 Color Fundus Photography Optos - OU - Both Eyes  3. Serous retinal detachment of left eye  H33.22   4. Retinal edema  H35.81 OCT, Retina - OU - Both Eyes  5. 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 Essential hypertension   I10   8. Hypertensive retinopathy of both eyes  H35.033   9. Pseudophakia of both eyes  Z96.1     1-4. 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 w/ shallow SRF/serous RD  - initially presented with symptomatic floaters OS  - initial exam showed large peripheral temporal subretinal hemorrhage OS -- spanning 2-430 -- also mild vitreous opacities and condensations that were causing symptomatic floaters  - B-scan (9.18.20) showed hyperreflective, ill-defined mass, 0300 periphery OS  - was seen by Dr. Pearletha Furl, Ocular Oncologist, on 9.22.2020 -- no tumor on exam or b-scan  - s/p IVA OS #1 (08.05.20) for subretinal hemorrhage, peripheral CNV  - s/p IVE OS #1 (09.02.20) - sample, #2 (10.12.20), #3 (11.11.20), #4 (12.09.20), #5 (1.20.21), #6 (02.17.21), #7 (03.19.21), #8 (04.30.21), #9 (06.16.21), #10 (08.11.21), #11 (12.01.21), #12 (01.26.22), #13 (03.30.22)  - today BCVA stable at 20/50   - exam shows stable improvement in subretinal heme and shallow SRF  - repeat color Optos image obtained -- peripheral SRH continues to improve -- very small, white and with surrounding pigment  - OCT shows Interval re-development of central cystic changes  - will re-start Prolensa and PF BID OS  - recommend IVE OS #14 today, 06.01.22 -- maintenance for [facility] / peripheral subtretinal heme OS, with ext of f/u to 10 wks  - pt wishes to proceed with  injection  - RBA of procedure discussed, questions answered  - see procedure note  - Eylea informed consent form signed and scanned on 01.20.2021  - [email or web address] Benefits investigation initiated 09.02.2020 -- approved for 2021  - f/u in 10 wks, sooner prn, OCT/DFE/optos colors, possible injection  5. Epiretinal membrane, OU.  - mild ERM OU  - asymptomatic, no metamorphopsia  - no indication for surgery at this time  - monitor for now  6. Retinoschisis OD -- stable ?less prominent  - bullous schisis cavity, IT quadrant, from 0700-0830, extends posteriorly  almost to arcades  - no associated RT/RD  - stable from prior  - discussed findings and prognosis  - recommend monitoring for now  - if progressing posteriorly, consider intervention  7,8. Hypertensive retinopathy OU  - discussed importance of tight BP control  - monitor  9. Pseudophakia OU  - s/p CE/IOL OU  - beautiful surgery, doing well  - monitor   Ophthalmic Meds Ordered this visit:  Meds ordered this encounter  Medications  . aflibercept (EYLEA) SOLN 2 mg       Return in about 10 weeks (around 08/12/2020) for f/u exu ARMD OS, DFE, OCT.  There are no Patient Instructions on file for this visit.   Explained the diagnoses, plan, and follow up with the patient and they expressed understanding.  Patient expressed understanding of the importance of proper follow up care.  This document serves as a record of services personally performed by Karie Chimera, MD, PhD. It was created on their behalf by Annalee Genta, COMT. The creation of this record is the provider's dictation and/or activities during the visit.  Electronically signed by: Annalee Genta, COMT 06/03/20 12:24 PM   This document serves as a record of services personally performed by Karie Chimera, MD, PhD. It was created on their behalf by Glee Arvin. Manson Passey, OA an ophthalmic technician. The creation of this record is the provider's dictation and/or activities during the visit.    Electronically signed by: Glee Arvin. Manson Passey, [place] 06.01.2022 12:24 PM  Karie Chimera, M.D., Ph.D. Diseases & Surgery of the Retina and Vitreous [facility]  I have reviewed the above documentation for accuracy and completeness, and I agree with the above. Karie Chimera, M.D., Ph.D. 06/03/20 12:24 PM   Abbreviations: M myopia (nearsighted); A astigmatism; H hyperopia (farsighted); P presbyopia; Mrx spectacle prescription;  CTL contact lenses; OD right eye; OS left eye; OU both eyes  XT exotropia; ET esotropia; PEK  punctate epithelial keratitis; PEE punctate epithelial erosions; DES dry eye syndrome; MGD meibomian gland dysfunction; ATs artificial tears; PFAT's preservative free artificial tears; NSC nuclear sclerotic cataract; PSC posterior subcapsular cataract; ERM epi-retinal membrane; PVD posterior vitreous detachment; RD retinal detachment; DM diabetes mellitus; DR diabetic retinopathy; NPDR non-proliferative diabetic retinopathy; PDR proliferative diabetic retinopathy; CSME clinically significant macular edema; DME diabetic macular edema; dbh dot blot hemorrhages; CWS cotton wool spot; POAG primary open angle glaucoma; C/D cup-to-disc ratio; HVF humphrey visual field; GVF goldmann visual field; OCT optical coherence tomography; IOP intraocular pressure; BRVO Branch retinal vein occlusion; CRVO central retinal vein occlusion; CRAO central retinal artery occlusion; BRAO branch retinal artery occlusion; RT retinal tear; SB scleral buckle; PPV pars plana vitrectomy; VH Vitreous hemorrhage; PRP panretinal laser photocoagulation; IVK intravitreal kenalog; VMT vitreomacular traction; MH Macular hole;  NVD neovascularization of the disc; NVE neovascularization elsewhere; AREDS age related eye disease study; ARMD age related macular degeneration; POAG primary open angle glaucoma;  EBMD epithelial/anterior basement membrane dystrophy; ACIOL anterior chamber intraocular lens; IOL intraocular lens; PCIOL posterior chamber intraocular lens; Phaco/IOL phacoemulsification with intraocular lens placement; [place] photorefractive keratectomy; LASIK laser assisted in situ keratomileusis; HTN hypertension; DM diabetes mellitus; COPD chronic obstructive pulmonary disease

## 2020-06-03 ENCOUNTER — Ambulatory Visit (INDEPENDENT_AMBULATORY_CARE_PROVIDER_SITE_OTHER): Payer: Medicare Other | Admitting: Ophthalmology

## 2020-06-03 ENCOUNTER — Other Ambulatory Visit: Payer: Self-pay

## 2020-06-03 ENCOUNTER — Other Ambulatory Visit (INDEPENDENT_AMBULATORY_CARE_PROVIDER_SITE_OTHER): Payer: Self-pay | Admitting: Ophthalmology

## 2020-06-03 ENCOUNTER — Encounter (INDEPENDENT_AMBULATORY_CARE_PROVIDER_SITE_OTHER): Payer: Self-pay | Admitting: Ophthalmology

## 2020-06-03 DIAGNOSIS — H35373 Puckering of macula, bilateral: Secondary | ICD-10-CM

## 2020-06-03 DIAGNOSIS — H33101 Unspecified retinoschisis, right eye: Secondary | ICD-10-CM

## 2020-06-03 DIAGNOSIS — H353221 Exudative age-related macular degeneration, left eye, with active choroidal neovascularization: Secondary | ICD-10-CM

## 2020-06-03 DIAGNOSIS — H3322 Serous retinal detachment, left eye: Secondary | ICD-10-CM | POA: Diagnosis not present

## 2020-06-03 DIAGNOSIS — H3581 Retinal edema: Secondary | ICD-10-CM | POA: Diagnosis not present

## 2020-06-03 DIAGNOSIS — I10 Essential (primary) hypertension: Secondary | ICD-10-CM

## 2020-06-03 DIAGNOSIS — Z961 Presence of intraocular lens: Secondary | ICD-10-CM

## 2020-06-03 DIAGNOSIS — H3562 Retinal hemorrhage, left eye: Secondary | ICD-10-CM

## 2020-06-03 DIAGNOSIS — H35033 Hypertensive retinopathy, bilateral: Secondary | ICD-10-CM

## 2020-06-03 MED ORDER — AFLIBERCEPT 2MG/0.05ML IZ SOLN FOR KALEIDOSCOPE
2.0000 mg | INTRAVITREAL | Status: AC | PRN
  Administered 2020-06-03: 2 mg via INTRAVITREAL

## 2020-06-17 ENCOUNTER — Other Ambulatory Visit (INDEPENDENT_AMBULATORY_CARE_PROVIDER_SITE_OTHER): Payer: Self-pay

## 2020-06-17 MED ORDER — PREDNISOLONE ACETATE 1 % OP SUSP: 1.0000 [drp] | Freq: Two times a day (BID) | OPHTHALMIC | 0 refills | Status: DC

## 2020-08-10 NOTE — Progress Notes (Addendum)
[facility] - Clinic Note  08/12/2020     CHIEF COMPLAINT Patient presents for Retina Follow Up   HISTORY OF PRESENT ILLNESS: Tracey Rosario is a 86 y.o. female who presents to the clinic today for:   HPI     Retina Follow Up   Patient presents with  Wet AMD.  In left eye.  This started 10 weeks ago.  I, the attending physician,  performed the HPI with the patient and updated documentation appropriately.        Comments   Patient here for 10 weeks retina follow up for exu ARMD OS. Patient states vision about the same. No eye pain.       Last edited by Rennis Chris, MD on 08/12/2020 12:41 PM.     pt states vision the same OU   Referring physician: Courtney Paris, NP [address] [place],  [place] [address]  HISTORICAL INFORMATION:   Selected notes from the MEDICAL RECORD NUMBER Referred by Dr. Baker Pierini for retinal eval   CURRENT MEDICATIONS: Current Outpatient Medications (Ophthalmic Drugs)  Medication Sig   Bromfenac Sodium (PROLENSA) 0.07 % SOLN Place 1 drop into the left eye 4 (four) times daily. (Patient not taking: Reported on 06/03/2020)   polyvinyl alcohol (LIQUIFILM TEARS) 1.4 % ophthalmic solution Place 1 drop into both eyes as needed for dry eyes.   prednisoLONE acetate (PRED FORTE) 1 % ophthalmic suspension Place 1 drop into the left eye 4 (four) times daily.   prednisoLONE acetate (PRED FORTE) 1 % ophthalmic suspension Place 1 drop into the left eye in the morning and at bedtime.   RESTASIS 0.05 % ophthalmic emulsion    No current facility-administered medications for this visit. (Ophthalmic Drugs)   Current Outpatient Medications (Other)  Medication Sig   ALPRAZolam (XANAX) 0.25 MG tablet    amLODipine (NORVASC) 10 MG tablet Take 1 tablet by mouth daily.   amLODipine (NORVASC) 5 MG tablet Take 5 mg by mouth daily.   aspirin EC 81 MG tablet Take 1 tablet (81 mg total) by mouth daily.   Biotin 1000 MCG tablet Take by  mouth.   Cholecalciferol (VITAMIN D3) 125 MCG (5000 UT) TABS Take 5,000 Units by mouth daily.    citalopram (CELEXA) 20 MG tablet Take 20 mg by mouth daily.   COVID-19 mRNA vaccine, Pfizer, 30 MCG/0.3ML injection    donepezil (ARICEPT) 10 MG tablet Take 10 mg by mouth daily.   estradiol (ESTRACE) 0.1 MG/GM vaginal cream    ferrous gluconate (FERGON) 324 MG tablet Take 324 mg by mouth daily.   gabapentin (NEURONTIN) 100 MG capsule Take 200 mg by mouth 2 (two) times daily.   levothyroxine (SYNTHROID) 50 MCG tablet Take 50 mcg by mouth daily before breakfast.   levothyroxine (SYNTHROID) 75 MCG tablet Take 75 mcg by mouth daily.   lisinopril (PRINIVIL,ZESTRIL) 30 MG tablet TAKE 1 TABLET DAILY (NEED AN APPOINTMENT)   meloxicam (MOBIC) 7.5 MG tablet Take 7.5 mg by mouth daily.    methocarbamol (ROBAXIN) 500 MG tablet Take 1 tablet (500 mg total) by mouth 2 (two) times daily as needed.   Multiple Vitamins-Minerals (CENTRUM SILVER) tablet Take 1 tablet by mouth daily.   NONFORMULARY OR COMPOUNDED ITEM Peripheral Neuropathy Cream: Bupivacaine 1%, Doxepin 3%, Gabapentin 6%, Pentoxifylline 3%, Topiramate 1% Order faxed to [facility]   nystatin (MYCOSTATIN) 100000 UNIT/ML suspension    omeprazole (PRILOSEC) 20 MG capsule TAKE 1 CAPSULE DAILY   pravastatin (PRAVACHOL) 20  MG tablet    predniSONE (STERAPRED UNI-PAK 21 TAB) 5 MG (21) TBPK tablet  (Patient not taking: Reported on 06/03/2020)   Probiotic Product (PROBIOTIC PO) Take 1 tablet by mouth daily.    No current facility-administered medications for this visit. (Other)      REVIEW OF SYSTEMS: ROS   Positive for: Musculoskeletal, Cardiovascular, Eyes, Heme/Lymph Negative for: Constitutional, Gastrointestinal, Neurological, Skin, Genitourinary, HENT, Endocrine, Respiratory, Psychiatric, Allergic/Imm Last edited by Laddie Aquas, COA on 08/12/2020 10:05 AM.        ALLERGIES Allergies  Allergen Reactions   Codeine Other (See  Comments)    Makes her crazy    PAST MEDICAL HISTORY Past Medical History:  Diagnosis Date   Acid reflux    Anemia    Arthropathy    Bradycardia    beta blocker reduced in 2021 due to this   CKD (chronic kidney disease), stage III (HCC)    Cognitive decline    referred to neurologist   Depression 10/07/2013   daughter didn't know anything about this   Dizziness    Frequent PVCs    GERD (gastroesophageal reflux disease) 05/04/2013   Hip fx (HCC)    r hip, bilat pubis rami   Hyperlipemia    Hypertension    Hypertensive retinopathy    OU   Hypothyroidism    Insomnia    Macular degeneration    Exu OS   Multilevel degenerative disc disease    Palpitations 05/04/2013   saw cardiologist in 2015, he deleted A. Fib from the record as per note not documentation to support   Premature atrial contractions    PSVT (paroxysmal supraventricular tachycardia) (HCC)    a. short runs by monitor in 2021.   Short-term memory loss    Vitamin D deficiency    Past Surgical History:  Procedure Laterality Date   ABDOMINAL HYSTERECTOMY  1971   BLADDER SURGERY  2010   Broken Pelvis  2015   C-EYE SURGERY PROCEDURE Bilateral    zaldivar   CATARACT EXTRACTION Bilateral    Done in [place]   EYE SURGERY Bilateral    Cat Sx   HIP PINNING,CANNULATED Right 10/07/2013   Procedure: CANNULATED screws right hip;  Surgeon: Kathryne Hitch, MD;  Location: [facility] OR;  Service: Orthopedics;  Laterality: Right;    FAMILY HISTORY Family History  Problem Relation Age of Onset   Prostate cancer Father    Heart attack Sister    Cancer Brother    Cancer Sister    Heart attack Sister    Cancer Sister    Stroke Daughter    Dementia Neg Hx    Alzheimer's disease Neg Hx     SOCIAL HISTORY Social History   Tobacco Use   Smoking status: Former    Types: Cigarettes    Quit date: 08/02/1970    Years since quitting: 50.0   Smokeless tobacco: Never   Tobacco comments:    "smoked very little"  Vaping  Use   Vaping Use: Never used  Substance Use Topics   Alcohol use: Never    Alcohol/week: 0.0 standard drinks   Drug use: Never         OPHTHALMIC EXAM:  Base Eye Exam     Visual Acuity (Snellen - Linear)       Right Left   Dist [place] 20/20 -1 20/50 -2   Dist ph [employer]  20/40 -2         Tonometry (Tonopen, 10:03 AM)  Right Left   Pressure 14 14         Pupils       Dark Light Shape React APD   Right 3 2 Round Minimal None   Left 3 2 Round Minimal None         Visual Fields (Counting fingers)       Left Right    Full Full         Extraocular Movement       Right Left    Full Full         Neuro/Psych     Oriented x3: Yes   Mood/Affect: Normal         Dilation     Both eyes: 1.0% Mydriacyl, 2.5% Phenylephrine @ 10:03 AM           Slit Lamp and Fundus Exam     Slit Lamp Exam       Right Left   Lids/Lashes Dermatochalasis - upper lid Dermatochalasis - upper lid   Conjunctiva/Sclera White and quiet White and quiet   Cornea Arcus, 1-2+ Punctate epithelial erosions, mild EBMD and corneal haze Arcus, 2-3+ Punctate epithelial erosions, EBMD, irregular epi   Anterior Chamber Deep and quiet Deep and quiet   Iris Round and dilated Round and moderately dilated to 59mm   Lens PC IOL in good position with open PC, anterior capsular phimosis PC IOL in good position with open PC   Vitreous Vitreous syneresis, Posterior vitreous detachment Vitreous syneresis; PVD; vitreous condensations         Fundus Exam       Right Left   Disc Mild temporal Pallor, Peripapillary atrophy, Sharp rim, Compact mild tilt, 2+ temporal Pallor, Peripapillary atrophy, Compact, Sharp rim   C/D Ratio 0.1 0.0   Macula Flat, Blunted foveal reflex, mild Epiretinal membrane, drusen, RPE mottling and clumping, No heme or edema Flat, Blunted foveal reflex, RPE mottling and clumping, Epiretinal membrane, interval increase in central cystic changes, No heme    Vessels Vascular  attenuation, Tortuous, small flame heme along ST ateriole attenuated, mild tortuousity   Periphery bullous IT schisis cavity from 0700-0830 extending posteriorly almost to arcades otherwise attached -- appears slightly less bullous, focal pigmented CR scar at 0630 persistent temporal sub-retinal hemorrhage - now white with surrounding pigment clumping;  No SRF -- completely resolved, focal, punctate IRH - improved, shallow schisis IT quad            IMAGING AND PROCEDURES  Imaging and Procedures for @TODAY @  OCT, Retina - OU - Both Eyes       Right Eye Quality was good. Central Foveal Thickness: 355. Progression has been stable. Findings include epiretinal membrane, normal foveal contour, no IRF, no SRF, retinal drusen , pigment epithelial detachment, macular pucker (stable ERM; bullous schisis IT quad caught on widefield).   Left Eye Quality was good. Central Foveal Thickness: 403. Progression has worsened. Findings include epiretinal membrane, retinal drusen , preretinal fibrosis, abnormal foveal contour, no SRF, intraretinal fluid (Interval re-development of central cystic changes, persistent ERM with PRF).   Notes *Images captured and stored on drive  Diagnosis / Impression:  OD: Stable ERM & retinal drusen OS: Interval increase in IRF/central cystic changes, persistent ERM with PRF  Clinical management:  See below  Abbreviations: NFP - Normal foveal profile. CME - cystoid macular edema. PED - pigment epithelial detachment. IRF - intraretinal fluid. SRF - subretinal fluid. EZ - ellipsoid zone. ERM - epiretinal membrane. ORA -  outer retinal atrophy. ORT - outer retinal tubulation. SRHM - subretinal hyper-reflective material      Color Fundus Photography Optos - OU - Both Eyes       Right Eye Progression has been stable. Disc findings include normal observations. Macula : retinal pigment epithelium abnormalities. Vessels : attenuated, tortuous vessels. Periphery : RPE  abnormality (IT schisis -- stable from prior).   Left Eye Progression has been stable. Disc findings include normal observations. Macula : normal observations. Vessels : attenuated. Periphery : RPE abnormality, hemorrhage (improvement in sub retinal hemorrhage temporally (0200) -- SRH now white and pigment clumping surrounding).   Notes **Images stored on drive**  Impression: OD: bullous retinoschisis, IT periphery -- stable from prior OS: PEHCR with stably improved sub retinal hemorrhage temporally (0200)       Intravitreal Injection, Pharmacologic Agent - OS - Left Eye       Time Out 08/12/2020. 11:49 AM. Confirmed correct patient, procedure, site, and patient consented.   Anesthesia Topical anesthesia was used. Anesthetic medications included Proparacaine 0.5%.   Procedure Preparation included 5% betadine to ocular surface, eyelid speculum. A (32 g) needle was used.   Injection: 2 mg aflibercept 2 MG/0.05ML   Route: Intravitreal, Site: Left Eye   NDC: [id number], Lot: [id number], Expiration date: 06/02/2021, Waste: 0.05 mL   Post-op Post injection exam found visual acuity of at least counting fingers. The patient tolerated the procedure well. There were no complications. The patient received written and verbal post procedure care education. Post injection medications were not given.            ASSESSMENT/PLAN:    ICD-10-CM   1. Exudative age-related macular degeneration of left eye with active choroidal neovascularization (HCC)  H35.3221 Color Fundus Photography Optos - OU - Both Eyes    Intravitreal Injection, Pharmacologic Agent - OS - Left Eye    aflibercept (EYLEA) SOLN 2 mg    2. Subretinal hemorrhage of left eye  H35.62 Color Fundus Photography Optos - OU - Both Eyes    3. Serous retinal detachment of left eye  H33.22     4. Retinal edema  H35.81 OCT, Retina - OU - Both Eyes    5. 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 Essential hypertension  I10     8. Hypertensive retinopathy of both eyes  H35.033     9. Pseudophakia of both eyes  Z96.1        1-4. 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 w/ shallow SRF/serous RD  - initially presented with symptomatic floaters OS  - initial exam showed large peripheral temporal subretinal hemorrhage OS -- spanning 2-430 -- also mild vitreous opacities and condensations that were causing symptomatic floaters  - B-scan (9.18.20) showed hyperreflective, ill-defined mass, 0300 periphery OS  - was seen by Dr. Pearletha FurlMaterin, Ocular Oncologist, on 9.22.2020 -- no tumor on exam or b-scan  - s/p IVA OS #1 (08.05.20) for subretinal hemorrhage, peripheral CNV  - s/p IVE OS #1 (09.02.20) - sample, #2 (10.12.20), #3 (11.11.20), #4 (12.09.20), #5 (1.20.21), #6 (02.17.21), #7 (03.19.21), #8 (04.30.21), #9 (06.16.21), #10 (08.11.21), #11 (12.01.21), #12 (01.26.22), #13 (03.30.22), #14 (06.01.22)  - today BCVA improved to 20/40-2   - exam shows stable improvement in subretinal heme and shallow SRF  - repeat color Optos image obtained -- peripheral SRH continues to improve -- very small, white and with surrounding pigment  -  OCT shows Interval increase in central cystic changes/IRF  - increase Prolensa and PF QID OS  - recommend IVE OS #15 today, 08.10.22, with dec f/u to 6 wks  - pt wishes to proceed with injection  - RBA of procedure discussed, questions answered  - see procedure note  - Eylea informed consent form signed and scanned on 01.20.2021  - [email or web address] Benefits investigation initiated 09.02.2020 -- approved for 2021  - f/u in 6 wks, sooner prn, OCT/DFE/optos colors, possible injection  5. Epiretinal membrane, OU.  - mild ERM OU  - asymptomatic, no metamorphopsia  - no indication for surgery at this time  - monitor for now  6. Retinoschisis OD -- stable ?less prominent  - bullous schisis cavity, IT quadrant, from 0700-0830, --  ?less posterior extension  - no associated RT/RD  - stable from prior  - discussed findings and prognosis  - recommend monitoring for now  - if progresses posteriorly, will consider intervention  7,8. Hypertensive retinopathy OU  - discussed importance of tight BP control  - monitor  9. Pseudophakia OU  - s/p CE/IOL OU  - beautiful surgery, doing well  - monitor   Ophthalmic Meds Ordered this visit:  Meds ordered this encounter  Medications   aflibercept (EYLEA) SOLN 2 mg      Return 6 weeks, for DFE, OCT, optos colors.  There are no Patient Instructions on file for this visit.   Explained the diagnoses, plan, and follow up with the patient and they expressed understanding.  Patient expressed understanding of the importance of proper follow up care.  This document serves as a record of services personally performed by Karie Chimera, MD, PhD. It was created on their behalf by Annalee Genta, COMT. The creation of this record is the provider's dictation and/or activities during the visit.  Electronically signed by: Annalee Genta, COMT 08/12/20 12:44 PM   Karie Chimera, M.D., Ph.D. Diseases & Surgery of the Retina and Vitreous [facility]  I have reviewed the above documentation for accuracy and completeness, and I agree with the above. Karie Chimera, M.D., Ph.D. 08/12/20 12:44 PM  Abbreviations: M myopia (nearsighted); A astigmatism; H hyperopia (farsighted); P presbyopia; Mrx spectacle prescription;  CTL contact lenses; OD right eye; OS left eye; OU both eyes  XT exotropia; ET esotropia; PEK punctate epithelial keratitis; PEE punctate epithelial erosions; DES dry eye syndrome; MGD meibomian gland dysfunction; ATs artificial tears; PFAT's preservative free artificial tears; NSC nuclear sclerotic cataract; PSC posterior subcapsular cataract; ERM epi-retinal membrane; PVD posterior vitreous detachment; RD retinal detachment; DM diabetes mellitus; DR  diabetic retinopathy; NPDR non-proliferative diabetic retinopathy; PDR proliferative diabetic retinopathy; CSME clinically significant macular edema; DME diabetic macular edema; dbh dot blot hemorrhages; CWS cotton wool spot; POAG primary open angle glaucoma; C/D cup-to-disc ratio; HVF humphrey visual field; GVF goldmann visual field; OCT optical coherence tomography; IOP intraocular pressure; BRVO Branch retinal vein occlusion; CRVO central retinal vein occlusion; CRAO central retinal artery occlusion; BRAO branch retinal artery occlusion; RT retinal tear; SB scleral buckle; PPV pars plana vitrectomy; VH Vitreous hemorrhage; PRP panretinal laser photocoagulation; IVK intravitreal kenalog; VMT vitreomacular traction; MH Macular hole;  NVD neovascularization of the disc; NVE neovascularization elsewhere; AREDS age related eye disease study; ARMD age related macular degeneration; POAG primary open angle glaucoma; EBMD epithelial/anterior basement membrane dystrophy; ACIOL anterior chamber intraocular lens; IOL intraocular lens; PCIOL posterior chamber intraocular lens; Phaco/IOL phacoemulsification with intraocular lens placement; PRK photorefractive keratectomy; LASIK laser  assisted in situ keratomileusis; HTN hypertension; DM diabetes mellitus; COPD chronic obstructive pulmonary disease

## 2020-08-12 ENCOUNTER — Other Ambulatory Visit: Payer: Self-pay

## 2020-08-12 ENCOUNTER — Ambulatory Visit (INDEPENDENT_AMBULATORY_CARE_PROVIDER_SITE_OTHER): Payer: Medicare Other | Admitting: Ophthalmology

## 2020-08-12 ENCOUNTER — Encounter (INDEPENDENT_AMBULATORY_CARE_PROVIDER_SITE_OTHER): Payer: Self-pay | Admitting: Ophthalmology

## 2020-08-12 DIAGNOSIS — I10 Essential (primary) hypertension: Secondary | ICD-10-CM

## 2020-08-12 DIAGNOSIS — H3581 Retinal edema: Secondary | ICD-10-CM | POA: Diagnosis not present

## 2020-08-12 DIAGNOSIS — H3322 Serous retinal detachment, left eye: Secondary | ICD-10-CM

## 2020-08-12 DIAGNOSIS — Z961 Presence of intraocular lens: Secondary | ICD-10-CM

## 2020-08-12 DIAGNOSIS — H3562 Retinal hemorrhage, left eye: Secondary | ICD-10-CM | POA: Diagnosis not present

## 2020-08-12 DIAGNOSIS — H353221 Exudative age-related macular degeneration, left eye, with active choroidal neovascularization: Secondary | ICD-10-CM

## 2020-08-12 DIAGNOSIS — H35033 Hypertensive retinopathy, bilateral: Secondary | ICD-10-CM

## 2020-08-12 DIAGNOSIS — H35373 Puckering of macula, bilateral: Secondary | ICD-10-CM

## 2020-08-12 DIAGNOSIS — H33101 Unspecified retinoschisis, right eye: Secondary | ICD-10-CM

## 2020-08-12 MED ORDER — AFLIBERCEPT 2MG/0.05ML IZ SOLN FOR KALEIDOSCOPE
2.0000 mg | INTRAVITREAL | Status: AC | PRN
  Administered 2020-08-12: 2 mg via INTRAVITREAL

## 2020-08-13 ENCOUNTER — Other Ambulatory Visit (INDEPENDENT_AMBULATORY_CARE_PROVIDER_SITE_OTHER): Payer: Self-pay

## 2020-08-13 MED ORDER — PREDNISOLONE ACETATE 1 % OP SUSP: 1.0000 [drp] | Freq: Four times a day (QID) | OPHTHALMIC | 0 refills | Status: AC

## 2020-09-23 NOTE — Progress Notes (Signed)
[facility] - Clinic Note  09/25/2020     CHIEF COMPLAINT Patient presents for Retina Follow Up   HISTORY OF PRESENT ILLNESS: Tracey Rosario is a 87 y.o. female who presents to the clinic today for:   HPI     Retina Follow Up   Patient presents with  Wet AMD.  In left eye.  This started 6 weeks ago.  I, the attending physician,  performed the HPI with the patient and updated documentation appropriately.        Comments   Patient here for 6 weeks retina follow up for exu ARMD OS. Patient states vision doing alright. No eye pain.       Last edited by Rennis Chris, MD on 09/27/2020 11:38 PM.     Referring physician: Courtney Paris, NP [address] [place],  [place] [address]  HISTORICAL INFORMATION:   Selected notes from the MEDICAL RECORD NUMBER Referred by Dr. Baker Pierini for retinal eval   CURRENT MEDICATIONS: Current Outpatient Medications (Ophthalmic Drugs)  Medication Sig   Bromfenac Sodium (PROLENSA) 0.07 % SOLN Place 1 drop into the left eye 4 (four) times daily.   polyvinyl alcohol (LIQUIFILM TEARS) 1.4 % ophthalmic solution Place 1 drop into both eyes as needed for dry eyes.   prednisoLONE acetate (PRED FORTE) 1 % ophthalmic suspension Place 1 drop into the left eye in the morning and at bedtime.   prednisoLONE acetate (PRED FORTE) 1 % ophthalmic suspension Place 1 drop into the left eye 4 (four) times daily.   RESTASIS 0.05 % ophthalmic emulsion    No current facility-administered medications for this visit. (Ophthalmic Drugs)   Current Outpatient Medications (Other)  Medication Sig   ALPRAZolam (XANAX) 0.25 MG tablet    amLODipine (NORVASC) 10 MG tablet Take 1 tablet by mouth daily.   amLODipine (NORVASC) 5 MG tablet Take 5 mg by mouth daily.   aspirin EC 81 MG tablet Take 1 tablet (81 mg total) by mouth daily.   Biotin 1000 MCG tablet Take by mouth.   Cholecalciferol (VITAMIN D3) 125 MCG (5000 UT) TABS Take 5,000 Units by  mouth daily.    citalopram (CELEXA) 20 MG tablet Take 20 mg by mouth daily.   COVID-19 mRNA vaccine, Pfizer, 30 MCG/0.3ML injection    donepezil (ARICEPT) 10 MG tablet Take 10 mg by mouth daily.   estradiol (ESTRACE) 0.1 MG/GM vaginal cream    ferrous gluconate (FERGON) 324 MG tablet Take 324 mg by mouth daily.   gabapentin (NEURONTIN) 100 MG capsule Take 200 mg by mouth 2 (two) times daily.   levothyroxine (SYNTHROID) 50 MCG tablet Take 50 mcg by mouth daily before breakfast.   levothyroxine (SYNTHROID) 75 MCG tablet Take 75 mcg by mouth daily.   lisinopril (PRINIVIL,ZESTRIL) 30 MG tablet TAKE 1 TABLET DAILY (NEED AN APPOINTMENT)   meloxicam (MOBIC) 7.5 MG tablet Take 7.5 mg by mouth daily.    methocarbamol (ROBAXIN) 500 MG tablet Take 1 tablet (500 mg total) by mouth 2 (two) times daily as needed.   Multiple Vitamins-Minerals (CENTRUM SILVER) tablet Take 1 tablet by mouth daily.   NONFORMULARY OR COMPOUNDED ITEM Peripheral Neuropathy Cream: Bupivacaine 1%, Doxepin 3%, Gabapentin 6%, Pentoxifylline 3%, Topiramate 1% Order faxed to [facility]   nystatin (MYCOSTATIN) 100000 UNIT/ML suspension    omeprazole (PRILOSEC) 20 MG capsule TAKE 1 CAPSULE DAILY   pravastatin (PRAVACHOL) 20 MG tablet    predniSONE (STERAPRED UNI-PAK 21 TAB) 5 MG (21) TBPK tablet  (  Patient not taking: Reported on 06/03/2020)   Probiotic Product (PROBIOTIC PO) Take 1 tablet by mouth daily.    No current facility-administered medications for this visit. (Other)   REVIEW OF SYSTEMS: ROS   Positive for: Musculoskeletal, Cardiovascular, Eyes, Heme/Lymph Negative for: Constitutional, Gastrointestinal, Neurological, Skin, Genitourinary, HENT, Endocrine, Respiratory, Psychiatric, Allergic/Imm Last edited by Laddie Aquas, COA on 09/25/2020  1:42 PM.     ALLERGIES Allergies  Allergen Reactions   Codeine Other (See Comments)    Makes her crazy    PAST MEDICAL HISTORY Past Medical History:  Diagnosis Date    Acid reflux    Anemia    Arthropathy    Bradycardia    beta blocker reduced in 2021 due to this   CKD (chronic kidney disease), stage III (HCC)    Cognitive decline    referred to neurologist   Depression 10/07/2013   daughter didn't know anything about this   Dizziness    Frequent PVCs    GERD (gastroesophageal reflux disease) 05/04/2013   Hip fx (HCC)    r hip, bilat pubis rami   Hyperlipemia    Hypertension    Hypertensive retinopathy    OU   Hypothyroidism    Insomnia    Macular degeneration    Exu OS   Multilevel degenerative disc disease    Palpitations 05/04/2013   saw cardiologist in 2015, he deleted A. Fib from the record as per note not documentation to support   Premature atrial contractions    PSVT (paroxysmal supraventricular tachycardia) (HCC)    a. short runs by monitor in 2021.   Short-term memory loss    Vitamin D deficiency    Past Surgical History:  Procedure Laterality Date   ABDOMINAL HYSTERECTOMY  1971   BLADDER SURGERY  2010   Broken Pelvis  2015   C-EYE SURGERY PROCEDURE Bilateral    zaldivar   CATARACT EXTRACTION Bilateral    Done in [place]   EYE SURGERY Bilateral    Cat Sx   HIP PINNING,CANNULATED Right 10/07/2013   Procedure: CANNULATED screws right hip;  Surgeon: Kathryne Hitch, MD;  Location: [facility] OR;  Service: Orthopedics;  Laterality: Right;    FAMILY HISTORY Family History  Problem Relation Age of Onset   Prostate cancer Father    Heart attack Sister    Cancer Brother    Cancer Sister    Heart attack Sister    Cancer Sister    Stroke Daughter    Dementia Neg Hx    Alzheimer's disease Neg Hx     SOCIAL HISTORY Social History   Tobacco Use   Smoking status: Former    Types: Cigarettes    Quit date: 08/02/1970    Years since quitting: 50.1   Smokeless tobacco: Never   Tobacco comments:    "smoked very little"  Vaping Use   Vaping Use: Never used  Substance Use Topics   Alcohol use: Never    Alcohol/week: 0.0  standard drinks   Drug use: Never       OPHTHALMIC EXAM:  Base Eye Exam     Visual Acuity (Snellen - Linear)       Right Left   Dist Prosser 20/25 +2 20/40 -2   Dist ph  NI 20/30 -1         Tonometry (Tonopen, 1:40 PM)       Right Left   Pressure 07 08         Pupils  Dark Light Shape React APD   Right 3 2 Round Minimal None   Left 3 2 Round Minimal None         Visual Fields (Counting fingers)       Left Right    Full Full         Extraocular Movement       Right Left    Full, Ortho Full, Ortho         Neuro/Psych     Oriented x3: Yes   Mood/Affect: Normal         Dilation     Both eyes: 1.0% Mydriacyl, 2.5% Phenylephrine @ 1:39 PM           Slit Lamp and Fundus Exam     Slit Lamp Exam       Right Left   Lids/Lashes Dermatochalasis - upper lid Dermatochalasis - upper lid   Conjunctiva/Sclera White and quiet White and quiet   Cornea Arcus, 1-2+ Punctate epithelial erosions, mild EBMD and corneal haze Arcus, 2-3+ Punctate epithelial erosions, EBMD, irregular epi   Anterior Chamber Deep and quiet Deep and quiet   Iris Round and dilated Round and moderately dilated to 42mm   Lens PC IOL in good position with open PC, anterior capsular phimosis PC IOL in good position with open PC   Vitreous Vitreous syneresis, Posterior vitreous detachment Vitreous syneresis; PVD; vitreous condensations         Fundus Exam       Right Left   Disc Mild temporal Pallor, Peripapillary atrophy, Sharp rim, Compact mild tilt, 2+ temporal Pallor, Peripapillary atrophy, Compact, Sharp rim   C/D Ratio 0.1 0.0   Macula Flat, Blunted foveal reflex, mild Epiretinal membrane, drusen, RPE mottling and clumping, No heme or edema Flat, Blunted foveal reflex, RPE mottling and clumping, Epiretinal membrane, interval improvement in central cystic changes, No heme    Vessels Vascular attenuation, Tortuous, small flame heme along ST ateriole attenuated, mild  tortuousity   Periphery bullous IT schisis cavity from 0700-0830 extending posteriorly almost to arcades otherwise attached -- appears slightly less bullous, focal pigmented CR scar at 0630 persistent temporal sub-retinal hemorrhage - now white with surrounding pigment clumping;  No SRF -- completely resolved, focal, punctate IRH - improved, shallow schisis IT quad            IMAGING AND PROCEDURES  Imaging and Procedures for @TODAY @  OCT, Retina - OU - Both Eyes       Right Eye Quality was good. Central Foveal Thickness: 348. Progression has been stable. Findings include epiretinal membrane, normal foveal contour, no IRF, no SRF, retinal drusen , pigment epithelial detachment, macular pucker (stable ERM; bullous schisis IT quad caught on widefield).   Left Eye Quality was good. Central Foveal Thickness: 318. Progression has improved. Findings include epiretinal membrane, retinal drusen , preretinal fibrosis, abnormal foveal contour, no SRF, intraretinal fluid (Interval improvement in central cystic changes, persistent ERM with PRF).   Notes *Images captured and stored on drive  Diagnosis / Impression:  OD: Stable ERM & retinal drusen OS: Interval improvement in central cystic changes, persistent ERM with PRF  Clinical management:  See below  Abbreviations: NFP - Normal foveal profile. CME - cystoid macular edema. PED - pigment epithelial detachment. IRF - intraretinal fluid. SRF - subretinal fluid. EZ - ellipsoid zone. ERM - epiretinal membrane. ORA - outer retinal atrophy. ORT - outer retinal tubulation. SRHM - subretinal hyper-reflective material      Intravitreal Injection,  Pharmacologic Agent - OS - Left Eye       Time Out 09/25/2020. 2:40 PM. Confirmed correct patient, procedure, site, and patient consented.   Anesthesia Topical anesthesia was used. Anesthetic medications included Proparacaine 0.5%, Lidocaine 2%.   Procedure Preparation included 5% betadine to  ocular surface, eyelid speculum. A (32g) needle was used.   Injection: 2 mg aflibercept 2 MG/0.05ML   Route: Intravitreal, Site: Left Eye   NDC: L6038910, Lot: [id number], Expiration date: 07/02/2021, Waste: 0.05 mL   Post-op Post injection exam found visual acuity of at least counting fingers. The patient tolerated the procedure well. There were no complications. The patient received written and verbal post procedure care education. Post injection medications were not given.            ASSESSMENT/PLAN:    ICD-10-CM   1. Exudative age-related macular degeneration of left eye with active choroidal neovascularization (HCC)  H35.3221 Intravitreal Injection, Pharmacologic Agent - OS - Left Eye    aflibercept (EYLEA) SOLN 2 mg    2. Subretinal hemorrhage of left eye  H35.62     3. Serous retinal detachment of left eye  H33.22     4. Retinal edema  H35.81 OCT, Retina - OU - Both Eyes    5. 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 Essential hypertension  I10     8. Hypertensive retinopathy of both eyes  H35.033     9. Pseudophakia of both eyes  Z96.1      1-4. 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 w/ shallow SRF/serous RD  - initially presented with symptomatic floaters OS  - initial exam showed large peripheral temporal subretinal hemorrhage OS -- spanning 2-430 -- also mild vitreous opacities and condensations that were causing symptomatic floaters  - B-scan (9.18.20) showed hyperreflective, ill-defined mass, 0300 periphery OS  - was seen by Dr. Pearletha Furl, Ocular Oncologist, on 9.22.2020 -- no tumor on exam or b-scan  - s/p IVA OS #1 (08.05.20) for subretinal hemorrhage, peripheral CNV  - s/p IVE OS #1 (09.02.20) - sample, #2 (10.12.20), #3 (11.11.20), #4 (12.09.20), #5 (1.20.21), #6 (02.17.21), #7 (03.19.21), #8 (04.30.21), #9 (06.16.21), #10 (08.11.21), #11 (12.01.21), #12 (01.26.22), #13 (03.30.22), #14  (06.01.22),  #15 (08.10.22)  - today BCVA improved to 20/30   - exam shows stable improvement in subretinal heme and shallow SRF  - repeat color Optos image obtained -- peripheral SRH continues to improve -- very small, white and with surrounding pigment  - OCT shows Interval improvement in central cystic changes/IRF at 6 wks  - cont Prolensa and PF QID OS  - recommend IVE OS #16 today, 09.23.22, with extension to 8 wks  - pt wishes to proceed with injection  - RBA of procedure discussed, questions answered  - see procedure note  - Eylea informed consent form signed and scanned on 01.20.2021  - [email or web address] Benefits investigation initiated 09.02.2020 -- approved for 2021  - f/u in 8 wks, sooner prn, OCT/DFE/optos colors, possible injection  5. Epiretinal membrane, OU.  - mild ERM OU  - asymptomatic, no metamorphopsia  - no indication for surgery at this time  - monitor for now  6. Retinoschisis OD -- stable ?less prominent  - bullous schisis cavity, IT quadrant, from 0700-0830, -- ?less posterior extension  - no associated RT/RD  - stable from prior  - discussed findings and prognosis  - recommend monitoring  for now  - if progresses posteriorly, will consider intervention  7,8. Hypertensive retinopathy OU  - discussed importance of tight BP control  - monitor   9. Pseudophakia OU  - s/p CE/IOL OU  - beautiful surgery, doing well  - monitor   Ophthalmic Meds Ordered this visit:  Meds ordered this encounter  Medications   Bromfenac Sodium (PROLENSA) 0.07 % SOLN    Sig: Place 1 drop into the left eye 4 (four) times daily.    Dispense:  3 mL    Refill:  6   prednisoLONE acetate (PRED FORTE) 1 % ophthalmic suspension    Sig: Place 1 drop into the left eye 4 (four) times daily.    Dispense:  15 mL    Refill:  0   aflibercept (EYLEA) SOLN 2 mg      Return in about 8 weeks (around 11/20/2020) for f/u exu ARMD OS, DFE, OCT.  There are no Patient Instructions on file for this  visit.   Explained the diagnoses, plan, and follow up with the patient and they expressed understanding.  Patient expressed understanding of the importance of proper follow up care.  This document serves as a record of services personally performed by Karie Chimera, MD, PhD. It was created on their behalf by Joni Reining, an ophthalmic technician. The creation of this record is the provider's dictation and/or activities during the visit.    Electronically signed by: Joni Reining COA, 09/27/20  11:44 PM  This document serves as a record of services personally performed by Karie Chimera, MD, PhD. It was created on their behalf by Glee Arvin. Manson Passey, OA an ophthalmic technician. The creation of this record is the provider's dictation and/or activities during the visit.    Electronically signed by: Glee Arvin. Kristopher Oppenheim 09.23.2022 11:44 PM  Karie Chimera, M.D., Ph.D. Diseases & Surgery of the Retina and Vitreous [facility] 09/25/2020  I have reviewed the above documentation for accuracy and completeness, and I agree with the above. Karie Chimera, M.D., Ph.D. 09/27/20 11:46 PM  Abbreviations: M myopia (nearsighted); A astigmatism; H hyperopia (farsighted); P presbyopia; Mrx spectacle prescription;  CTL contact lenses; OD right eye; OS left eye; OU both eyes  XT exotropia; ET esotropia; PEK punctate epithelial keratitis; PEE punctate epithelial erosions; DES dry eye syndrome; MGD meibomian gland dysfunction; ATs artificial tears; PFAT's preservative free artificial tears; NSC nuclear sclerotic cataract; PSC posterior subcapsular cataract; ERM epi-retinal membrane; PVD posterior vitreous detachment; RD retinal detachment; DM diabetes mellitus; DR diabetic retinopathy; NPDR non-proliferative diabetic retinopathy; PDR proliferative diabetic retinopathy; CSME clinically significant macular edema; DME diabetic macular edema; dbh dot blot hemorrhages; CWS cotton wool spot; POAG  primary open angle glaucoma; C/D cup-to-disc ratio; HVF humphrey visual field; GVF goldmann visual field; OCT optical coherence tomography; IOP intraocular pressure; BRVO Branch retinal vein occlusion; CRVO central retinal vein occlusion; CRAO central retinal artery occlusion; BRAO branch retinal artery occlusion; RT retinal tear; SB scleral buckle; PPV pars plana vitrectomy; VH Vitreous hemorrhage; PRP panretinal laser photocoagulation; IVK intravitreal kenalog; VMT vitreomacular traction; MH Macular hole;  NVD neovascularization of the disc; NVE neovascularization elsewhere; AREDS age related eye disease study; ARMD age related macular degeneration; POAG primary open angle glaucoma; EBMD epithelial/anterior basement membrane dystrophy; ACIOL anterior chamber intraocular lens; IOL intraocular lens; PCIOL posterior chamber intraocular lens; Phaco/IOL phacoemulsification with intraocular lens placement; PRK photorefractive keratectomy; LASIK laser assisted in situ keratomileusis; HTN hypertension; DM diabetes mellitus; COPD chronic  obstructive pulmonary disease

## 2020-09-25 ENCOUNTER — Encounter (INDEPENDENT_AMBULATORY_CARE_PROVIDER_SITE_OTHER): Payer: Self-pay | Admitting: Ophthalmology

## 2020-09-25 ENCOUNTER — Ambulatory Visit (INDEPENDENT_AMBULATORY_CARE_PROVIDER_SITE_OTHER): Payer: Medicare Other | Admitting: Ophthalmology

## 2020-09-25 ENCOUNTER — Other Ambulatory Visit: Payer: Self-pay

## 2020-09-25 DIAGNOSIS — H353221 Exudative age-related macular degeneration, left eye, with active choroidal neovascularization: Secondary | ICD-10-CM

## 2020-09-25 DIAGNOSIS — H35373 Puckering of macula, bilateral: Secondary | ICD-10-CM

## 2020-09-25 DIAGNOSIS — H35033 Hypertensive retinopathy, bilateral: Secondary | ICD-10-CM

## 2020-09-25 DIAGNOSIS — I10 Essential (primary) hypertension: Secondary | ICD-10-CM

## 2020-09-25 DIAGNOSIS — H3562 Retinal hemorrhage, left eye: Secondary | ICD-10-CM

## 2020-09-25 DIAGNOSIS — H3322 Serous retinal detachment, left eye: Secondary | ICD-10-CM

## 2020-09-25 DIAGNOSIS — Z961 Presence of intraocular lens: Secondary | ICD-10-CM

## 2020-09-25 DIAGNOSIS — H3581 Retinal edema: Secondary | ICD-10-CM

## 2020-09-25 DIAGNOSIS — H33101 Unspecified retinoschisis, right eye: Secondary | ICD-10-CM

## 2020-09-25 MED ORDER — PREDNISOLONE ACETATE 1 % OP SUSP: 1.0000 [drp] | Freq: Four times a day (QID) | OPHTHALMIC | 0 refills | Status: DC

## 2020-09-25 MED ORDER — PROLENSA 0.07 % OP SOLN: 1.0000 [drp] | Freq: Four times a day (QID) | OPHTHALMIC | 6 refills | Status: DC

## 2020-09-27 ENCOUNTER — Encounter (INDEPENDENT_AMBULATORY_CARE_PROVIDER_SITE_OTHER): Payer: Self-pay | Admitting: Ophthalmology

## 2020-09-27 MED ORDER — AFLIBERCEPT 2MG/0.05ML IZ SOLN FOR KALEIDOSCOPE
2.0000 mg | INTRAVITREAL | Status: AC | PRN
  Administered 2020-09-25: 2 mg via INTRAVITREAL

## 2020-09-28 ENCOUNTER — Other Ambulatory Visit (INDEPENDENT_AMBULATORY_CARE_PROVIDER_SITE_OTHER): Payer: Self-pay

## 2020-09-28 MED ORDER — PROLENSA 0.07 % OP SOLN: 1.0000 [drp] | Freq: Four times a day (QID) | OPHTHALMIC | 3 refills | Status: AC

## 2020-11-17 NOTE — Progress Notes (Signed)
[facility] Note  11/20/2020     CHIEF COMPLAINT Patient presents for Retina Follow Up   HISTORY OF PRESENT ILLNESS: Tracey Rosario is a 87 y.o. female who presents to the clinic today for:   HPI     Retina Follow Up   Patient presents with  Wet AMD.  In left eye.  Severity is moderate.  Duration of 8 weeks.  Since onset it is stable.  I, the attending physician,  performed the HPI with the patient and updated documentation appropriately.        Comments   Pt here for 8 wk ret f/u for exu ARMD OS. Pt states no vision changes, no ocular pain or discomfort.       Last edited by Bernarda Caffey, MD on 11/20/2020  9:00 PM.    Pt states vision is stable, still using PF and Prolensa QID OS  Referring physician: Simona Huh, NP [place],  [place] [id number]  HISTORICAL INFORMATION:   Selected notes from the MEDICAL RECORD NUMBER Referred by Dr. Quentin Ore for retinal eval   CURRENT MEDICATIONS: Current Outpatient Medications (Ophthalmic Drugs)  Medication Sig   Bromfenac Sodium (PROLENSA) 0.07 % SOLN Place 1 drop into the left eye 4 (four) times daily.   RESTASIS 0.05 % ophthalmic emulsion    polyvinyl alcohol (LIQUIFILM TEARS) 1.4 % ophthalmic solution Place 1 drop into both eyes as needed for dry eyes.   prednisoLONE acetate (PRED FORTE) 1 % ophthalmic suspension Place 1 drop into the left eye in the morning and at bedtime. (Patient not taking: Reported on 11/20/2020)   prednisoLONE acetate (PRED FORTE) 1 % ophthalmic suspension Place 1 drop into the left eye 4 (four) times daily.   No current facility-administered medications for this visit. (Ophthalmic Drugs)   Current Outpatient Medications (Other)  Medication Sig   amLODipine (NORVASC) 10 MG tablet Take 1 tablet by mouth daily.   amLODipine (NORVASC) 5 MG tablet Take 5 mg by mouth daily.   aspirin EC 81 MG tablet Take 1 tablet (81 mg total) by mouth daily.    Biotin 1000 MCG tablet Take by mouth.   Cholecalciferol (VITAMIN D3) 125 MCG (5000 UT) TABS Take 5,000 Units by mouth daily.    citalopram (CELEXA) 20 MG tablet Take 20 mg by mouth daily.   COVID-19 mRNA vaccine, Pfizer, 30 MCG/0.3ML injection    donepezil (ARICEPT) 10 MG tablet Take 10 mg by mouth daily.   estradiol (ESTRACE) 0.1 MG/GM vaginal cream    ferrous gluconate (FERGON) 324 MG tablet Take 324 mg by mouth daily.   gabapentin (NEURONTIN) 100 MG capsule Take 200 mg by mouth 2 (two) times daily.   lisinopril (PRINIVIL,ZESTRIL) 30 MG tablet TAKE 1 TABLET DAILY (NEED AN APPOINTMENT)   meloxicam (MOBIC) 7.5 MG tablet Take 7.5 mg by mouth daily.    methocarbamol (ROBAXIN) 500 MG tablet Take 1 tablet (500 mg total) by mouth 2 (two) times daily as needed.   Multiple Vitamins-Minerals (CENTRUM SILVER) tablet Take 1 tablet by mouth daily.   NONFORMULARY OR COMPOUNDED ITEM Peripheral Neuropathy Cream: Bupivacaine 1%, Doxepin 3%, Gabapentin 6%, Pentoxifylline 3%, Topiramate 1% Order faxed to [facility]   omeprazole (PRILOSEC) 20 MG capsule TAKE 1 CAPSULE DAILY   ALPRAZolam (XANAX) 0.25 MG tablet    levothyroxine (SYNTHROID) 50 MCG tablet Take 50 mcg by mouth daily before breakfast.   levothyroxine (SYNTHROID) 75 MCG tablet Take 75 mcg by mouth  daily.   nystatin (MYCOSTATIN) 100000 UNIT/ML suspension  (Patient not taking: Reported on 11/20/2020)   pravastatin (PRAVACHOL) 20 MG tablet    predniSONE (STERAPRED UNI-PAK 21 TAB) 5 MG (21) TBPK tablet  (Patient not taking: Reported on 06/03/2020)   Probiotic Product (PROBIOTIC PO) Take 1 tablet by mouth daily.  (Patient not taking: Reported on 11/20/2020)   No current facility-administered medications for this visit. (Other)   REVIEW OF SYSTEMS: ROS   Positive for: Musculoskeletal, Cardiovascular, Eyes, Heme/Lymph Negative for: Constitutional, Gastrointestinal, Neurological, Skin, Genitourinary, HENT, Endocrine, Respiratory, Psychiatric,  Allergic/Imm Last edited by Thompson Grayer, COT on 11/20/2020 12:58 PM.      ALLERGIES Allergies  Allergen Reactions   Codeine Other (See Comments)    Makes her crazy    PAST MEDICAL HISTORY Past Medical History:  Diagnosis Date   Acid reflux    Anemia    Arthropathy    Bradycardia    beta blocker reduced in 2021 due to this   CKD (chronic kidney disease), stage III (HCC)    Cognitive decline    referred to neurologist   Depression 10/07/2013   daughter didn't know anything about this   Dizziness    Frequent PVCs    GERD (gastroesophageal reflux disease) 05/04/2013   Hip fx (HCC)    r hip, bilat pubis rami   Hyperlipemia    Hypertension    Hypertensive retinopathy    OU   Hypothyroidism    Insomnia    Macular degeneration    Exu OS   Multilevel degenerative disc disease    Palpitations 05/04/2013   saw cardiologist in 2015, he deleted A. Fib from the record as per note not documentation to support   Premature atrial contractions    PSVT (paroxysmal supraventricular tachycardia) (HCC)    a. short runs by monitor in 2021.   Short-term memory loss    Vitamin D deficiency    Past Surgical History:  Procedure Laterality Date   ABDOMINAL HYSTERECTOMY  1971   BLADDER SURGERY  2010   Broken Pelvis  2015   C-EYE SURGERY PROCEDURE Bilateral    zaldivar   CATARACT EXTRACTION Bilateral    Done in [place]   EYE SURGERY Bilateral    Cat Sx   HIP PINNING,CANNULATED Right 10/07/2013   Procedure: CANNULATED screws right hip;  Surgeon: Kathryne Hitch, MD;  Location: [facility] OR;  Service: Orthopedics;  Laterality: Right;    FAMILY HISTORY Family History  Problem Relation Age of Onset   Prostate cancer Father    Heart attack Sister    Cancer Brother    Cancer Sister    Heart attack Sister    Cancer Sister    Stroke Daughter    Dementia Neg Hx    Alzheimer's disease Neg Hx     SOCIAL HISTORY Social History   Tobacco Use   Smoking status: Former     Types: Cigarettes    Quit date: 08/02/1970    Years since quitting: 50.3   Smokeless tobacco: Never   Tobacco comments:    "smoked very little"  Vaping Use   Vaping Use: Never used  Substance Use Topics   Alcohol use: Never    Alcohol/week: 0.0 standard drinks   Drug use: Never       OPHTHALMIC EXAM:  Base Eye Exam     Visual Acuity (Snellen - Linear)       Right Left   Dist [place] 20/25 +2 20/40 -2  Dist ph [place] NI 20/30 -2         Tonometry (Tonopen, 1:11 PM)       Right Left   Pressure 12 15         Pupils       Dark Light Shape React APD   Right 3 2 Round Minimal None   Left 3 2 Round Minimal None         Visual Fields (Counting fingers)       Left Right    Full Full         Extraocular Movement       Right Left    Full, Ortho Full, Ortho         Neuro/Psych     Oriented x3: Yes   Mood/Affect: Normal         Dilation     Both eyes: 1.0% Mydriacyl, 2.5% Phenylephrine @ 1:12 PM           Slit Lamp and Fundus Exam     Slit Lamp Exam       Right Left   Lids/Lashes Dermatochalasis - upper lid Dermatochalasis - upper lid   Conjunctiva/Sclera White and quiet White and quiet   Cornea Arcus, 1-2+ Punctate epithelial erosions, mild EBMD and corneal haze Arcus, 2-3+ Punctate epithelial erosions, EBMD, irregular epi   Anterior Chamber Deep and quiet Deep and quiet   Iris Round and dilated Round and moderately dilated to 63mm   Lens PC IOL in good position with open PC, anterior capsular phimosis PC IOL in good position with open PC   Anterior Vitreous Vitreous syneresis, Posterior vitreous detachment Vitreous syneresis; PVD; vitreous condensations         Fundus Exam       Right Left   Disc Mild temporal Pallor, Peripapillary atrophy, Sharp rim, Compact mild tilt, 2+ temporal Pallor, Peripapillary atrophy, Compact, Sharp rim   C/D Ratio 0.1 0.0   Macula Flat, Blunted foveal reflex, mild Epiretinal membrane, drusen, RPE mottling and  clumping, No heme or edema Flat, Blunted foveal reflex, RPE mottling and clumping, Epiretinal membrane, stable improvement in central cystic changes, No heme   Vessels attenuated, Tortuous attenuated, mild tortuousity   Periphery bullous IT schisis cavity from 0700-0830 extending posteriorly almost to arcades otherwise attached -- appears slightly less bullous, focal pigmented CR scar at 0630 -- now with subretinal heme and exudation temporal sub-retinal hemorrhage improved - now white with surrounding pigment clumping;  No SRF -- completely resolved, focal, punctate IRH - improved, shallow schisis IT quad            IMAGING AND PROCEDURES  Imaging and Procedures for $RemoveBefore'@TODAY'WezmMUgSzQIeB$ @  OCT, Retina - OU - Both Eyes       Right Eye Quality was good. Central Foveal Thickness: 350. Progression has been stable. Findings include epiretinal membrane, normal foveal contour, no IRF, no SRF, retinal drusen , pigment epithelial detachment, macular pucker (stable ERM; bullous schisis IT quad caught on widefield).   Left Eye Quality was good. Central Foveal Thickness: 311. Progression has been stable. Findings include epiretinal membrane, retinal drusen , preretinal fibrosis, abnormal foveal contour, no SRF, intraretinal fluid (Stable improvement in central cystic changes, persistent ERM with PRF).   Notes *Images captured and stored on drive  Diagnosis / Impression:  OD: Stable ERM & retinal drusen OS: stable improvement in central cystic changes, persistent ERM with PRF  Clinical management:  See below  Abbreviations: NFP - Normal foveal profile. CME -  cystoid macular edema. PED - pigment epithelial detachment. IRF - intraretinal fluid. SRF - subretinal fluid. EZ - ellipsoid zone. ERM - epiretinal membrane. ORA - outer retinal atrophy. ORT - outer retinal tubulation. SRHM - subretinal hyper-reflective material      Intravitreal Injection, Pharmacologic Agent - OS - Left Eye       Time  Out 11/20/2020. 1:44 PM. Confirmed correct patient, procedure, site, and patient consented.   Anesthesia Topical anesthesia was used. Anesthetic medications included Proparacaine 0.5%, Lidocaine 2%.   Procedure Preparation included 5% betadine to ocular surface, eyelid speculum. A (32g) needle was used.   Injection: 2 mg aflibercept 2 MG/0.05ML   Route: Intravitreal, Site: Left Eye   NDC: A3590391, Lot: [id number], Expiration date: 10/02/2021, Waste: 0.05 mL   Post-op Post injection exam found visual acuity of at least counting fingers. The patient tolerated the procedure well. There were no complications. The patient received written and verbal post procedure care education. Post injection medications were not given.      Intravitreal Injection, Pharmacologic Agent - OD - Right Eye       Time Out 11/20/2020. 1:51 PM. Confirmed correct patient, procedure, site, and patient consented.   Anesthesia Topical anesthesia was used. Anesthetic medications included Lidocaine 2%, Proparacaine 0.5%.   Procedure Preparation included 5% betadine to ocular surface, eyelid speculum. A (32g) needle was used.   Injection: 1.25 mg Bevacizumab 1.$RemoveBeforeDE'25mg'iBTQIxPQJCaHBnv$ /0.51ml   Route: Intravitreal, Site: Right Eye   NDC: H061816, Lot: [id number], Expiration date: 01/06/2021, Waste: 0.05 mL   Post-op Post injection exam found visual acuity of at least counting fingers. The patient tolerated the procedure well. There were no complications. The patient received written and verbal post procedure care education. Post injection medications were not given.             ASSESSMENT/PLAN:    ICD-10-CM   1. Exudative age-related macular degeneration of both eyes with active choroidal neovascularization (HCC)  H35.3231 Intravitreal Injection, Pharmacologic Agent - OS - Left Eye    Intravitreal Injection, Pharmacologic Agent - OD - Right Eye    aflibercept (EYLEA) SOLN 2 mg    Bevacizumab (AVASTIN) SOLN 1.25 mg     2. Subretinal hemorrhage of both eyes  H35.63     3. Serous retinal detachment of left eye  H33.22     4. Retinal edema  H35.81 OCT, Retina - OU - Both Eyes    5. 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 Essential hypertension  I10     8. Hypertensive retinopathy of both eyes  H35.033     9. Pseudophakia of both eyes  Z96.1       1-4. Exudative age related macular degeneration / PECHR OU  - interval conversion to exu ARMD OD noted on 11.18.22 exam -- new SRH and exudates inferior periphery  - initially presented with symptomatic floaters OS  - initial exam showed large peripheral temporal subretinal hemorrhage OS -- spanning 2-430 -- also mild vitreous opacities and condensations that were causing symptomatic floaters  - B-scan (9.18.20) showed hyperreflective, ill-defined mass, 0300 periphery OS  - was seen by Dr. Daralene Milch, Ocular Oncologist, on 9.22.2020 -- no tumor on exam or b-scan  - s/p IVA OS #1 (08.05.20) for subretinal hemorrhage, peripheral CNV  - s/p IVE OS #1 (09.02.20) - sample, #2 (10.12.20), #3 (11.11.20), #4 (12.09.20), #5 (1.20.21), #6 (02.17.21), #7 (03.19.21), #8 (04.30.21), #9 (06.16.21), #  10 (08.11.21), #11 (12.01.21), #12 (01.26.22), #13 (03.30.22), #14 (06.01.22), #15 (08.10.22), #16 (09.23.22)  - today BCVA improved to 20/30 OS  - exam shows OD: new SRH and exudates inferior periphery; OS: stable improvement in subretinal heme and shallow SRF OS  - OCT shows stable improvement in central cystic changes/IRF OS at 8 wks  - dec Prolensa and PF to BID OS  - recommend IVA OD #1 and IVE OS #17 today, 11.18.22, with f/u in 4 weeks for OD  - pt wishes to proceed with injections  - RBA of procedure discussed, questions answered  - see procedure note  - IVA OD informed consent obtained and signed, 11.18.22  - Eylea informed consent form signed and scanned on 01.20.2021  - Eylea[employer] Benefits investigation initiated  09.02.2020 -- approved for 2021  - f/u in 4 wks, sooner prn, OCT/DFE/optos colors, possible injection  5. Epiretinal membrane, OU.  - mild ERM OU  - asymptomatic, no metamorphopsia  - no indication for surgery at this time  - monitor for now  6. Retinoschisis OD -- stable ?less prominent  - bullous schisis cavity, IT quadrant, from 0700-0830, -- ?less posterior extension  - no associated RT/RD  - stable from prior  - discussed findings and prognosis  - recommend monitoring for now  - if progresses posteriorly, will consider intervention  7,8. Hypertensive retinopathy OU  - discussed importance of tight BP control  - monitor   9. Pseudophakia OU  - s/p CE/IOL OU  - beautiful surgery, doing well  - monitor   Ophthalmic Meds Ordered this visit:  Meds ordered this encounter  Medications   prednisoLONE acetate (PRED FORTE) 1 % ophthalmic suspension    Sig: Place 1 drop into the left eye 4 (four) times daily.    Dispense:  15 mL    Refill:  1   aflibercept (EYLEA) SOLN 2 mg   Bevacizumab (AVASTIN) SOLN 1.25 mg       Return in about 4 weeks (around 12/18/2020) for f/u exu ARMD OU, DFE, OCT.  There are no Patient Instructions on file for this visit.   Explained the diagnoses, plan, and follow up with the patient and they expressed understanding.  Patient expressed understanding of the importance of proper follow up care.  This document serves as a record of services personally performed by Gardiner Sleeper, MD, PhD. It was created on their behalf by Leonie Douglas, an ophthalmic technician. The creation of this record is the provider's dictation and/or activities during the visit.    Electronically signed by: Leonie Douglas COA, 11/20/20  9:07 PM   Gardiner Sleeper, M.D., Ph.D. Diseases & Surgery of the Retina and Vitreous [facility] 11/20/2020  I have reviewed the above documentation for accuracy and completeness, and I agree with the above. Gardiner Sleeper, M.D., Ph.D. 11/20/20 9:07 PM    Abbreviations: M myopia (nearsighted); A astigmatism; H hyperopia (farsighted); P presbyopia; Mrx spectacle prescription;  CTL contact lenses; OD right eye; OS left eye; OU both eyes  XT exotropia; ET esotropia; PEK punctate epithelial keratitis; PEE punctate epithelial erosions; DES dry eye syndrome; MGD meibomian gland dysfunction; ATs artificial tears; PFAT's preservative free artificial tears; [place] nuclear sclerotic cataract; PSC posterior subcapsular cataract; ERM epi-retinal membrane; PVD posterior vitreous detachment; RD retinal detachment; DM diabetes mellitus; DR diabetic retinopathy; NPDR non-proliferative diabetic retinopathy; PDR proliferative diabetic retinopathy; CSME clinically significant macular edema; DME diabetic macular edema; dbh dot blot hemorrhages; CWS  cotton wool spot; POAG primary open angle glaucoma; C/D cup-to-disc ratio; HVF humphrey visual field; GVF goldmann visual field; OCT optical coherence tomography; IOP intraocular pressure; BRVO Branch retinal vein occlusion; CRVO central retinal vein occlusion; CRAO central retinal artery occlusion; BRAO branch retinal artery occlusion; RT retinal tear; SB scleral buckle; PPV pars plana vitrectomy; VH Vitreous hemorrhage; PRP panretinal laser photocoagulation; IVK intravitreal kenalog; VMT vitreomacular traction; MH Macular hole;  NVD neovascularization of the disc; NVE neovascularization elsewhere; AREDS age related eye disease study; ARMD age related macular degeneration; POAG primary open angle glaucoma; EBMD epithelial/anterior basement membrane dystrophy; ACIOL anterior chamber intraocular lens; IOL intraocular lens; PCIOL posterior chamber intraocular lens; Phaco/IOL phacoemulsification with intraocular lens placement; [place] photorefractive keratectomy; LASIK laser assisted in situ keratomileusis; HTN hypertension; DM diabetes mellitus; COPD chronic obstructive pulmonary disease

## 2020-11-20 ENCOUNTER — Encounter (INDEPENDENT_AMBULATORY_CARE_PROVIDER_SITE_OTHER): Payer: Self-pay | Admitting: Ophthalmology

## 2020-11-20 ENCOUNTER — Other Ambulatory Visit: Payer: Self-pay

## 2020-11-20 ENCOUNTER — Ambulatory Visit (INDEPENDENT_AMBULATORY_CARE_PROVIDER_SITE_OTHER): Payer: Medicare Other | Admitting: Ophthalmology

## 2020-11-20 DIAGNOSIS — H353231 Exudative age-related macular degeneration, bilateral, with active choroidal neovascularization: Secondary | ICD-10-CM

## 2020-11-20 DIAGNOSIS — H35373 Puckering of macula, bilateral: Secondary | ICD-10-CM | POA: Diagnosis not present

## 2020-11-20 DIAGNOSIS — H3322 Serous retinal detachment, left eye: Secondary | ICD-10-CM

## 2020-11-20 DIAGNOSIS — Z961 Presence of intraocular lens: Secondary | ICD-10-CM

## 2020-11-20 DIAGNOSIS — H33101 Unspecified retinoschisis, right eye: Secondary | ICD-10-CM | POA: Diagnosis not present

## 2020-11-20 DIAGNOSIS — H35033 Hypertensive retinopathy, bilateral: Secondary | ICD-10-CM

## 2020-11-20 DIAGNOSIS — H3562 Retinal hemorrhage, left eye: Secondary | ICD-10-CM

## 2020-11-20 DIAGNOSIS — H3581 Retinal edema: Secondary | ICD-10-CM

## 2020-11-20 DIAGNOSIS — H3563 Retinal hemorrhage, bilateral: Secondary | ICD-10-CM

## 2020-11-20 DIAGNOSIS — I10 Essential (primary) hypertension: Secondary | ICD-10-CM

## 2020-11-20 DIAGNOSIS — H353221 Exudative age-related macular degeneration, left eye, with active choroidal neovascularization: Secondary | ICD-10-CM

## 2020-11-20 MED ORDER — PREDNISOLONE ACETATE 1 % OP SUSP: 1.0000 [drp] | Freq: Four times a day (QID) | OPHTHALMIC | 1 refills | Status: DC

## 2020-11-20 MED ORDER — BEVACIZUMAB CHEMO INJECTION 1.25MG/0.05ML SYRINGE FOR KALEIDOSCOPE
1.2500 mg | INTRAVITREAL | Status: AC | PRN
Start: 2020-11-20 — End: 2020-11-20
  Administered 2020-11-20: 1.25 mg via INTRAVITREAL

## 2020-11-20 MED ORDER — AFLIBERCEPT 2MG/0.05ML IZ SOLN FOR KALEIDOSCOPE
2.0000 mg | INTRAVITREAL | Status: AC | PRN
  Administered 2020-11-20: 2 mg via INTRAVITREAL

## 2020-12-15 NOTE — Progress Notes (Signed)
[facility] - Clinic Note  12/18/2020     CHIEF COMPLAINT Patient presents for Retina Follow Up    HISTORY OF PRESENT ILLNESS: Tracey Rosario is a 87 y.o. female who presents to the clinic today for:   HPI     Retina Follow Up   Patient presents with  Wet AMD.  In both eyes.  This started years ago.  Severity is moderate.  Duration of 4 weeks.  Since onset it is stable.  I, the attending physician,  performed the HPI with the patient and updated documentation appropriately.        Comments   87 y/o female pt here for 4 wk f/u for exu ARMD OU.  No change in [place] OU.  Denies pain, FOL, floaters.  PF and Prolensa BID OS.  Needs refill on Prolensa.      Last edited by Rennis Chris, MD on 12/18/2020  1:42 PM.     Pt states vision is stable, still using PF and Prolensa BID OS  Referring physician: Courtney Paris, NP [address] [place],  [place] [address]  HISTORICAL INFORMATION:   Selected notes from the MEDICAL RECORD NUMBER Referred by Dr. Baker Pierini for retinal eval   CURRENT MEDICATIONS: Current Outpatient Medications (Ophthalmic Drugs)  Medication Sig   Bromfenac Sodium (PROLENSA) 0.07 % SOLN Place 1 drop into the left eye 4 (four) times daily.   polyvinyl alcohol (LIQUIFILM TEARS) 1.4 % ophthalmic solution Place 1 drop into both eyes as needed for dry eyes.   prednisoLONE acetate (PRED FORTE) 1 % ophthalmic suspension Place 1 drop into the left eye in the morning and at bedtime.   RESTASIS 0.05 % ophthalmic emulsion    prednisoLONE acetate (PRED FORTE) 1 % ophthalmic suspension Place 1 drop into the left eye 4 (four) times daily. (Patient not taking: Reported on 12/18/2020)   No current facility-administered medications for this visit. (Ophthalmic Drugs)   Current Outpatient Medications (Other)  Medication Sig   ALPRAZolam (XANAX) 0.25 MG tablet    amLODipine (NORVASC) 10 MG tablet Take 1 tablet by mouth daily.   amLODipine  (NORVASC) 5 MG tablet Take 5 mg by mouth daily.   aspirin EC 81 MG tablet Take 1 tablet (81 mg total) by mouth daily.   Biotin 1000 MCG tablet Take by mouth.   cephALEXin (KEFLEX) 500 MG capsule    Cholecalciferol (VITAMIN D3) 125 MCG (5000 UT) TABS Take 5,000 Units by mouth daily.    citalopram (CELEXA) 20 MG tablet Take 20 mg by mouth daily.   COVID-19 mRNA vaccine, Pfizer, 30 MCG/0.3ML injection    donepezil (ARICEPT) 10 MG tablet Take 10 mg by mouth daily.   estradiol (ESTRACE) 0.1 MG/GM vaginal cream    ferrous gluconate (FERGON) 324 MG tablet Take 324 mg by mouth daily.   gabapentin (NEURONTIN) 100 MG capsule Take 200 mg by mouth 2 (two) times daily.   gabapentin (NEURONTIN) 300 MG capsule    levothyroxine (SYNTHROID) 50 MCG tablet Take 50 mcg by mouth daily before breakfast.   levothyroxine (SYNTHROID) 75 MCG tablet Take 75 mcg by mouth daily.   lisinopril (PRINIVIL,ZESTRIL) 30 MG tablet TAKE 1 TABLET DAILY (NEED AN APPOINTMENT)   lisinopril (ZESTRIL) 10 MG tablet Take 10 mg by mouth daily.   meloxicam (MOBIC) 7.5 MG tablet Take 7.5 mg by mouth daily.    methocarbamol (ROBAXIN) 500 MG tablet Take 1 tablet (500 mg total) by mouth 2 (two) times daily  as needed.   Multiple Vitamins-Minerals (CENTRUM SILVER) tablet Take 1 tablet by mouth daily.   NONFORMULARY OR COMPOUNDED ITEM Peripheral Neuropathy Cream: Bupivacaine 1%, Doxepin 3%, Gabapentin 6%, Pentoxifylline 3%, Topiramate 1% Order faxed to [facility]   nystatin (MYCOSTATIN) 100000 UNIT/ML suspension    omeprazole (PRILOSEC) 20 MG capsule TAKE 1 CAPSULE DAILY   oxybutynin (DITROPAN-XL) 5 MG 24 hr tablet Take 5 mg by mouth daily.   pravastatin (PRAVACHOL) 20 MG tablet    predniSONE (STERAPRED UNI-PAK 21 TAB) 5 MG (21) TBPK tablet    Probiotic Product (PROBIOTIC PO) Take 1 tablet by mouth daily.   No current facility-administered medications for this visit. (Other)   REVIEW OF SYSTEMS: ROS   Positive for:  Gastrointestinal, Neurological, Genitourinary, Cardiovascular, Eyes Negative for: Constitutional, Skin, Musculoskeletal, HENT, Endocrine, Respiratory, Psychiatric, Allergic/Imm, Heme/Lymph Last edited by Celine Mans, COA on 12/18/2020  1:11 PM.     ALLERGIES Allergies  Allergen Reactions   Codeine Other (See Comments)    Makes her crazy   PAST MEDICAL HISTORY Past Medical History:  Diagnosis Date   Acid reflux    Anemia    Arthropathy    Bradycardia    beta blocker reduced in 2021 due to this   CKD (chronic kidney disease), stage III (HCC)    Cognitive decline    referred to neurologist   Depression 10/07/2013   daughter didn't know anything about this   Dizziness    Frequent PVCs    GERD (gastroesophageal reflux disease) 05/04/2013   Hip fx (HCC)    r hip, bilat pubis rami   Hyperlipemia    Hypertension    Hypertensive retinopathy    OU   Hypothyroidism    Insomnia    Macular degeneration    Exu OS   Multilevel degenerative disc disease    Palpitations 05/04/2013   saw cardiologist in 2015, he deleted A. Fib from the record as per note not documentation to support   Premature atrial contractions    PSVT (paroxysmal supraventricular tachycardia) (HCC)    a. short runs by monitor in 2021.   Short-term memory loss    Vitamin D deficiency    Past Surgical History:  Procedure Laterality Date   ABDOMINAL HYSTERECTOMY  1971   BLADDER SURGERY  2010   Broken Pelvis  2015   C-EYE SURGERY PROCEDURE Bilateral    zaldivar   CATARACT EXTRACTION Bilateral    Done in [place]   EYE SURGERY Bilateral    Cat Sx   HIP PINNING,CANNULATED Right 10/07/2013   Procedure: CANNULATED screws right hip;  Surgeon: Kathryne Hitch, MD;  Location: [facility] OR;  Service: Orthopedics;  Laterality: Right;   FAMILY HISTORY Family History  Problem Relation Age of Onset   Prostate cancer Father    Heart attack Sister    Cancer Brother    Cancer Sister    Heart attack Sister    Cancer  Sister    Stroke Daughter    Dementia Neg Hx    Alzheimer's disease Neg Hx    SOCIAL HISTORY Social History   Tobacco Use   Smoking status: Former    Types: Cigarettes    Quit date: 08/02/1970    Years since quitting: 50.4   Smokeless tobacco: Never   Tobacco comments:    "smoked very little"  Vaping Use   Vaping Use: Never used  Substance Use Topics   Alcohol use: Never    Alcohol/week: 0.0 standard drinks  Drug use: Never       OPHTHALMIC EXAM:  Base Eye Exam     Visual Acuity (Snellen - Linear)       Right Left   Dist [place] 20/25 +2 20/25 -2   Dist ph [place] NI NI         Tonometry (Tonopen, 1:20 PM)       Right Left   Pressure 9 11         Pupils       Dark Light Shape React APD   Right 3 2 Round Minimal None   Left 3 2 Round Minimal None         Visual Fields (Counting fingers)       Left Right    Full Full         Extraocular Movement       Right Left    Full, Ortho Full, Ortho         Neuro/Psych     Oriented x3: Yes   Mood/Affect: Normal         Dilation     Both eyes: 1.0% Mydriacyl, 2.5% Phenylephrine @ 1:20 PM           Slit Lamp and Fundus Exam     Slit Lamp Exam       Right Left   Lids/Lashes Dermatochalasis - upper lid Dermatochalasis - upper lid   Conjunctiva/Sclera White and quiet White and quiet   Cornea Arcus, 1-2+ Punctate epithelial erosions, mild EBMD and corneal haze Arcus, 2-3+ Punctate epithelial erosions, EBMD, irregular epi   Anterior Chamber Deep and quiet Deep and quiet   Iris Round and dilated Round and moderately dilated to 20mm   Lens PC IOL in good position with open PC, anterior capsular phimosis PC IOL in good position with open PC   Anterior Vitreous Vitreous syneresis, Posterior vitreous detachment Vitreous syneresis; PVD; vitreous condensations         Fundus Exam       Right Left   Disc Mild temporal Pallor, Peripapillary atrophy, Sharp rim, Compact mild tilt, 2+ temporal Pallor,  Peripapillary atrophy, Compact, Sharp rim   C/D Ratio 0.1 0.0   Macula Flat, Blunted foveal reflex, mild Epiretinal membrane, drusen, RPE mottling and clumping, No heme or edema Flat, Blunted foveal reflex, RPE mottling and clumping, Epiretinal membrane, stable improvement in central cystic changes, No heme   Vessels attenuated, Tortuous attenuated, mild tortuousity   Periphery bullous IT schisis cavity from 0700-0830 extending posteriorly almost to arcades; otherwise attached; mild interval improvement in [facility] and exudation under schisis cavities, focal pigmented CR scar at 0630 temporal sub-retinal hemorrhage improved - now white with surrounding pigment clumping;  No SRF -- completely resolved, focal, punctate IRH - improved, shallow schisis IT quad            IMAGING AND PROCEDURES  Imaging and Procedures for @TODAY @  OCT, Retina - OU - Both Eyes       Right Eye Quality was good. Central Foveal Thickness: 334. Progression has been stable. Findings include epiretinal membrane, normal foveal contour, no IRF, no SRF, retinal drusen , pigment epithelial detachment, macular pucker (stable ERM; bullous schisis IT quad caught on widefield).   Left Eye Quality was good. Central Foveal Thickness: 312. Progression has been stable. Findings include epiretinal membrane, retinal drusen , preretinal fibrosis, abnormal foveal contour, no SRF, intraretinal fluid (Stable improvement in central cystic changes, persistent ERM with PRF).   Notes *Images captured and  stored on drive  Diagnosis / Impression:  OD: Stable ERM & retinal drusen OS: stable improvement in central cystic changes, persistent ERM with PRF  Clinical management:  See below  Abbreviations: NFP - Normal foveal profile. CME - cystoid macular edema. PED - pigment epithelial detachment. IRF - intraretinal fluid. SRF - subretinal fluid. EZ - ellipsoid zone. ERM - epiretinal membrane. ORA - outer retinal atrophy. ORT - outer retinal  tubulation. SRHM - subretinal hyper-reflective material      Color Fundus Photography Optos - OU - Both Eyes       Right Eye Progression has been stable. Disc findings include normal observations. Macula : retinal pigment epithelium abnormalities. Vessels : attenuated, tortuous vessels. Periphery : RPE abnormality.   Left Eye Progression has been stable. Disc findings include normal observations. Macula : normal observations. Vessels : attenuated. Periphery : RPE abnormality, hemorrhage (improvement in sub retinal hemorrhage temporally (0200) -- SRH now white and pigment clumping surrounding).   Notes **Images stored on drive**  Impression: OD: IT schisis -- stable, but now with +heme -- PEHCR OS: PEHCR with stably improved sub retinal hemorrhage temporally (0200)       Intravitreal Injection, Pharmacologic Agent - OD - Right Eye       Time Out 12/18/2020. 1:48 PM. Confirmed correct patient, procedure, site, and patient consented.   Anesthesia Topical anesthesia was used. Anesthetic medications included Lidocaine 2%, Proparacaine 0.5%.   Procedure Preparation included 5% betadine to ocular surface, eyelid speculum. A (32g) needle was used.   Injection: 1.25 mg Bevacizumab 1.25mg /0.73ml   Route: Intravitreal, Site: Right Eye   NDC: [id number], Lot: [id number] , Expiration date: 02/09/2021, Waste: 0 mL   Post-op Post injection exam found visual acuity of at least counting fingers. The patient tolerated the procedure well. There were no complications. The patient received written and verbal post procedure care education. Post injection medications were not given.            ASSESSMENT/PLAN:    ICD-10-CM   1. Exudative age-related macular degeneration of both eyes with active choroidal neovascularization (HCC)  H35.3231 OCT, Retina - OU - Both Eyes    Color Fundus Photography Optos - OU - Both Eyes    Intravitreal Injection, Pharmacologic Agent - OD - Right Eye     Bevacizumab (AVASTIN) SOLN 1.25 mg    2. Subretinal hemorrhage of both eyes  H35.63     3. Serous retinal detachment of left eye  H33.22     4. 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 Essential hypertension  I10     7. Hypertensive retinopathy of both eyes  H35.033     8. Pseudophakia of both eyes  Z96.1      1-3. 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 Pearletha Furl, Ocular Oncologist, on 9.22.2020 -- no tumor on exam or b-scan  - s/p IVA OS #1 (08.05.20) for subretinal hemorrhage, peripheral CNV  - s/p IVA OD #1 (11.18.22)  - s/p IVE OS #1 (09.02.20) -  sample, #2 (10.12.20), #3 (11.11.20), #4 (12.09.20), #5 (1.20.21), #6 (02.17.21), #7 (03.19.21), #8 (04.30.21), #9 (06.16.21), #10 (08.11.21), #11 (12.01.21), #12 (01.26.22), #13 (03.30.22), #14 (06.01.22), #15 (08.10.22), #16 (09.23.22), #17 (11.18.22)  - today BCVA improved to 20/25 OU  - exam shows OD: interval improvement in [facility] and exudates IT periphery; OS: stable improvement in subretinal heme and shallow SRF OS  - OCT shows stable improvement in central cystic changes/IRF OS  - dec Prolensa and PF to Qdaily OS  - recommend IVA OD #2 today, 12.16.22, with f/u in 4 weeks for probable IVA OU  - pt wishes to proceed with injection  - RBA of procedure discussed, questions answered  - see procedure note  - IVA OD informed consent obtained and signed, 11.18.22  - [employer] informed consent form signed and scanned on 01.20.2021  - [email or web address] Benefits  investigation initiated 09.02.2020 -- approved for 2021  - f/u in 4 wks, sooner prn, OCT/DFE/optos colors, possible injections  4. Epiretinal membrane, OU.  - mild ERM OU  - asymptomatic, no metamorphopsia  - no indication for surgery at this time  - monitor for now  5. Retinoschisis OD -- stable ?less prominent  - bullous schisis cavity, IT quadrant, from 0700-0830, -- ?less posterior extension  - no associated RT/RD but now with [facility] and exudates -- improving  - discussed findings and prognosis  - recommend monitoring for now  - if progresses posteriorly, will consider intervention  6,7. Hypertensive retinopathy OU  - discussed importance of tight BP control  - monitor   8. Pseudophakia OU  - s/p CE/IOL OU  - beautiful surgery, doing well  - monitor   Ophthalmic Meds Ordered this visit:  Meds ordered this encounter  Medications   Bevacizumab (AVASTIN) SOLN 1.25 mg     Return in about 4 weeks (around 01/15/2021) for ex ARMD OU, Dilated Exam, OCT, Possible Injxn.  There are no Patient Instructions on file for this visit.   Explained the diagnoses, plan, and follow up with the patient and they expressed understanding.  Patient expressed understanding of the importance of proper follow up care.  This document serves as a record of services personally performed by Karie Chimera, MD, PhD. It was created on their behalf by Joni Reining, an ophthalmic technician. The creation of this record is the provider's dictation and/or activities during the visit.    Electronically signed by: Joni Reining COA, 12/18/20  4:09 PM  This document serves as a record of services personally performed by Karie Chimera, MD, PhD. It was created on their behalf by Glee Arvin. Manson Passey, OA an ophthalmic technician. The creation of this record is the provider's dictation and/or activities during the visit.    Electronically signed by: Glee Arvin. Manson Passey, [place] 12.16.2022 4:09 PM  Karie Chimera, M.D.,  Ph.D. Diseases & Surgery of the Retina and Vitreous [facility] 12/18/2020  I have reviewed the above documentation for accuracy and completeness, and I agree with the above. Karie Chimera, M.D., Ph.D. 12/18/20 4:12 PM   Abbreviations: M myopia (nearsighted); A astigmatism; H hyperopia (farsighted); P presbyopia; Mrx spectacle prescription;  CTL contact lenses; OD right eye; OS left eye; OU both eyes  XT exotropia; ET esotropia; PEK punctate epithelial keratitis; PEE punctate epithelial erosions; DES dry eye syndrome; MGD meibomian gland dysfunction; ATs artificial tears; PFAT's preservative free artificial tears; NSC nuclear sclerotic cataract; PSC posterior subcapsular cataract; ERM epi-retinal membrane; PVD posterior vitreous detachment; RD  retinal detachment; DM diabetes mellitus; DR diabetic retinopathy; NPDR non-proliferative diabetic retinopathy; PDR proliferative diabetic retinopathy; CSME clinically significant macular edema; DME diabetic macular edema; dbh dot blot hemorrhages; CWS cotton wool spot; POAG primary open angle glaucoma; C/D cup-to-disc ratio; HVF humphrey visual field; GVF goldmann visual field; OCT optical coherence tomography; IOP intraocular pressure; BRVO Branch retinal vein occlusion; CRVO central retinal vein occlusion; CRAO central retinal artery occlusion; BRAO branch retinal artery occlusion; RT retinal tear; SB scleral buckle; PPV pars plana vitrectomy; VH Vitreous hemorrhage; PRP panretinal laser photocoagulation; IVK intravitreal kenalog; VMT vitreomacular traction; MH Macular hole;  NVD neovascularization of the disc; NVE neovascularization elsewhere; AREDS age related eye disease study; ARMD age related macular degeneration; POAG primary open angle glaucoma; EBMD epithelial/anterior basement membrane dystrophy; ACIOL anterior chamber intraocular lens; IOL intraocular lens; PCIOL posterior chamber intraocular lens; Phaco/IOL phacoemulsification  with intraocular lens placement; PRK photorefractive keratectomy; LASIK laser assisted in situ keratomileusis; HTN hypertension; DM diabetes mellitus; COPD chronic obstructive pulmonary disease

## 2020-12-18 ENCOUNTER — Ambulatory Visit (INDEPENDENT_AMBULATORY_CARE_PROVIDER_SITE_OTHER): Payer: Medicare Other | Admitting: Ophthalmology

## 2020-12-18 ENCOUNTER — Other Ambulatory Visit: Payer: Self-pay

## 2020-12-18 ENCOUNTER — Encounter (INDEPENDENT_AMBULATORY_CARE_PROVIDER_SITE_OTHER): Payer: Self-pay | Admitting: Ophthalmology

## 2020-12-18 DIAGNOSIS — H33101 Unspecified retinoschisis, right eye: Secondary | ICD-10-CM | POA: Diagnosis not present

## 2020-12-18 DIAGNOSIS — H3563 Retinal hemorrhage, bilateral: Secondary | ICD-10-CM | POA: Diagnosis not present

## 2020-12-18 DIAGNOSIS — H353231 Exudative age-related macular degeneration, bilateral, with active choroidal neovascularization: Secondary | ICD-10-CM

## 2020-12-18 DIAGNOSIS — H3322 Serous retinal detachment, left eye: Secondary | ICD-10-CM | POA: Diagnosis not present

## 2020-12-18 DIAGNOSIS — Z961 Presence of intraocular lens: Secondary | ICD-10-CM

## 2020-12-18 DIAGNOSIS — H35373 Puckering of macula, bilateral: Secondary | ICD-10-CM | POA: Diagnosis not present

## 2020-12-18 DIAGNOSIS — H3581 Retinal edema: Secondary | ICD-10-CM

## 2020-12-18 DIAGNOSIS — H35033 Hypertensive retinopathy, bilateral: Secondary | ICD-10-CM

## 2020-12-18 DIAGNOSIS — I10 Essential (primary) hypertension: Secondary | ICD-10-CM

## 2020-12-18 MED ORDER — BEVACIZUMAB CHEMO INJECTION 1.25MG/0.05ML SYRINGE FOR KALEIDOSCOPE
1.2500 mg | INTRAVITREAL | Status: AC | PRN
Start: 2020-12-18 — End: 2020-12-18
  Administered 2020-12-18: 1.25 mg via INTRAVITREAL

## 2021-01-14 NOTE — Progress Notes (Signed)
[facility] - Clinic Note  01/15/2021     CHIEF COMPLAINT Patient presents for Retina Follow Up  HISTORY OF PRESENT ILLNESS: Tracey Rosario is a 88 y.o. female who presents to the clinic today for:   HPI     Retina Follow Up   Patient presents with  Wet AMD.  In both eyes.  This started years ago.  Severity is moderate.  Duration of 4 weeks.  Since onset it is stable.  I, the attending physician,  performed the HPI with the patient and updated documentation appropriately.        Comments   88 y/o female pt here for 4 wk f/u for exu ARMD OU.  No change in [place] OU noticed.  Denies pain, FOL, floaters.  Prolensa and PF QD OS.      Last edited by Rennis Chris, MD on 01/15/2021  2:14 PM.      Pt states vision is stable  Referring physician: Courtney Paris, NP [address] [place],  [place] [address]  HISTORICAL INFORMATION:   Selected notes from the MEDICAL RECORD NUMBER Referred by Dr. Baker Pierini for retinal eval   CURRENT MEDICATIONS: Current Outpatient Medications (Ophthalmic Drugs)  Medication Sig   Bromfenac Sodium (PROLENSA) 0.07 % SOLN Place 1 drop into the left eye 4 (four) times daily.   polyvinyl alcohol (LIQUIFILM TEARS) 1.4 % ophthalmic solution Place 1 drop into both eyes as needed for dry eyes.   prednisoLONE acetate (PRED FORTE) 1 % ophthalmic suspension Place 1 drop into the left eye in the morning and at bedtime.   prednisoLONE acetate (PRED FORTE) 1 % ophthalmic suspension Place 1 drop into the left eye 4 (four) times daily. (Patient not taking: Reported on 12/18/2020)   RESTASIS 0.05 % ophthalmic emulsion    No current facility-administered medications for this visit. (Ophthalmic Drugs)   Current Outpatient Medications (Other)  Medication Sig   ALPRAZolam (XANAX) 0.25 MG tablet    amLODipine (NORVASC) 10 MG tablet Take 1 tablet by mouth daily.   amLODipine (NORVASC) 5 MG tablet Take 5 mg by mouth daily.   aspirin EC 81  MG tablet Take 1 tablet (81 mg total) by mouth daily.   Biotin 1000 MCG tablet Take by mouth.   cephALEXin (KEFLEX) 500 MG capsule    Cholecalciferol (VITAMIN D3) 125 MCG (5000 UT) TABS Take 5,000 Units by mouth daily.    citalopram (CELEXA) 20 MG tablet Take 20 mg by mouth daily.   COVID-19 mRNA vaccine, Pfizer, 30 MCG/0.3ML injection    donepezil (ARICEPT) 10 MG tablet Take 10 mg by mouth daily.   estradiol (ESTRACE) 0.1 MG/GM vaginal cream    ferrous gluconate (FERGON) 324 MG tablet Take 324 mg by mouth daily.   gabapentin (NEURONTIN) 100 MG capsule Take 200 mg by mouth 2 (two) times daily.   gabapentin (NEURONTIN) 300 MG capsule    levothyroxine (SYNTHROID) 50 MCG tablet Take 50 mcg by mouth daily before breakfast.   levothyroxine (SYNTHROID) 75 MCG tablet Take 75 mcg by mouth daily.   lisinopril (PRINIVIL,ZESTRIL) 30 MG tablet TAKE 1 TABLET DAILY (NEED AN APPOINTMENT)   lisinopril (ZESTRIL) 10 MG tablet Take 10 mg by mouth daily.   meloxicam (MOBIC) 7.5 MG tablet Take 7.5 mg by mouth daily.    methocarbamol (ROBAXIN) 500 MG tablet Take 1 tablet (500 mg total) by mouth 2 (two) times daily as needed.   Multiple Vitamins-Minerals (CENTRUM SILVER) tablet Take 1 tablet  by mouth daily.   NONFORMULARY OR COMPOUNDED ITEM Peripheral Neuropathy Cream: Bupivacaine 1%, Doxepin 3%, Gabapentin 6%, Pentoxifylline 3%, Topiramate 1% Order faxed to [facility]   nystatin (MYCOSTATIN) 100000 UNIT/ML suspension    omeprazole (PRILOSEC) 20 MG capsule TAKE 1 CAPSULE DAILY   oxybutynin (DITROPAN-XL) 5 MG 24 hr tablet Take 5 mg by mouth daily.   pravastatin (PRAVACHOL) 20 MG tablet    predniSONE (STERAPRED UNI-PAK 21 TAB) 5 MG (21) TBPK tablet    Probiotic Product (PROBIOTIC PO) Take 1 tablet by mouth daily.   No current facility-administered medications for this visit. (Other)   REVIEW OF SYSTEMS: ROS   Positive for: Gastrointestinal, Neurological, Genitourinary, Musculoskeletal,  Cardiovascular, Eyes Negative for: Constitutional, Skin, HENT, Endocrine, Respiratory, Psychiatric, Allergic/Imm, Heme/Lymph Last edited by Celine Mans, COA on 01/15/2021  1:10 PM.      ALLERGIES Allergies  Allergen Reactions   Codeine Other (See Comments)    Makes her crazy   PAST MEDICAL HISTORY Past Medical History:  Diagnosis Date   Acid reflux    Anemia    Arthropathy    Bradycardia    beta blocker reduced in 2021 due to this   CKD (chronic kidney disease), stage III (HCC)    Cognitive decline    referred to neurologist   Depression 10/07/2013   daughter didn't know anything about this   Dizziness    Frequent PVCs    GERD (gastroesophageal reflux disease) 05/04/2013   Hip fx (HCC)    r hip, bilat pubis rami   Hyperlipemia    Hypertension    Hypertensive retinopathy    OU   Hypothyroidism    Insomnia    Macular degeneration    Exu OS   Multilevel degenerative disc disease    Palpitations 05/04/2013   saw cardiologist in 2015, he deleted A. Fib from the record as per note not documentation to support   Premature atrial contractions    PSVT (paroxysmal supraventricular tachycardia) (HCC)    a. short runs by monitor in 2021.   Short-term memory loss    Vitamin D deficiency    Past Surgical History:  Procedure Laterality Date   ABDOMINAL HYSTERECTOMY  1971   BLADDER SURGERY  2010   Broken Pelvis  2015   C-EYE SURGERY PROCEDURE Bilateral    zaldivar   CATARACT EXTRACTION Bilateral    Done in [place]   EYE SURGERY Bilateral    Cat Sx   HIP PINNING,CANNULATED Right 10/07/2013   Procedure: CANNULATED screws right hip;  Surgeon: Kathryne Hitch, MD;  Location: [facility] OR;  Service: Orthopedics;  Laterality: Right;   FAMILY HISTORY Family History  Problem Relation Age of Onset   Prostate cancer Father    Heart attack Sister    Cancer Brother    Cancer Sister    Heart attack Sister    Cancer Sister    Stroke Daughter    Dementia Neg Hx    Alzheimer's  disease Neg Hx    SOCIAL HISTORY Social History   Tobacco Use   Smoking status: Former    Types: Cigarettes    Quit date: 08/02/1970    Years since quitting: 50.4   Smokeless tobacco: Never   Tobacco comments:    "smoked very little"  Vaping Use   Vaping Use: Never used  Substance Use Topics   Alcohol use: Never    Alcohol/week: 0.0 standard drinks   Drug use: Never       OPHTHALMIC EXAM:  Base Eye Exam     Visual Acuity (Snellen - Linear)       Right Left   Dist [place] 20/25 20/40 -2   Dist ph [place] NI 20/30 -2         Tonometry (Tonopen, 1:13 PM)       Right Left   Pressure 10 10         Pupils       Dark Light Shape React APD   Right 3 2 Round Minimal None   Left 3 2 Round Minimal None         Visual Fields (Counting fingers)       Left Right    Full Full         Extraocular Movement       Right Left    Full, Ortho Full, Ortho         Neuro/Psych     Oriented x3: Yes   Mood/Affect: Normal         Dilation     Both eyes: 1.0% Mydriacyl, 2.5% Phenylephrine @ 1:13 PM           Slit Lamp and Fundus Exam     Slit Lamp Exam       Right Left   Lids/Lashes Dermatochalasis - upper lid Dermatochalasis - upper lid   Conjunctiva/Sclera White and quiet White and quiet   Cornea Arcus, 1-2+ Punctate epithelial erosions, mild EBMD and corneal haze Arcus, 2-3+ Punctate epithelial erosions, EBMD, irregular epi   Anterior Chamber Deep and quiet Deep and quiet   Iris Round and dilated Round and moderately dilated to 5mm   Lens PC IOL in good position with open PC, anterior capsular phimosis PC IOL in good position with open PC   Anterior Vitreous Vitreous syneresis, Posterior vitreous detachment Vitreous syneresis; PVD; vitreous condensations         Fundus Exam       Right Left   Disc Mild temporal Pallor, Peripapillary atrophy, Sharp rim, Compact mild tilt, 2+ temporal Pallor, Peripapillary atrophy, Compact, Sharp rim   C/D Ratio 0.1  0.0   Macula Flat, Blunted foveal reflex, mild Epiretinal membrane, drusen, RPE mottling and clumping, No heme or edema Flat, Blunted foveal reflex, RPE mottling and clumping, Epiretinal membrane, stable improvement in central cystic changes, No heme   Vessels attenuated, Tortuous attenuated, mild tortuousity   Periphery bullous IT schisis cavity from 0700-0830 extending posteriorly almost to arcades; otherwise attached; mild interval improvement in [facility] and exudation under schisis cavities, focal pigmented CR scar at 0630 temporal sub-retinal hemorrhage improved - now white with surrounding pigment clumping;  No SRF -- completely resolved, focal, punctate IRH - improved, shallow schisis IT quad, No heme            IMAGING AND PROCEDURES  Imaging and Procedures for @TODAY @  OCT, Retina - OU - Both Eyes       Right Eye Quality was good. Central Foveal Thickness: 334. Progression has been stable. Findings include epiretinal membrane, normal foveal contour, no IRF, no SRF, retinal drusen , pigment epithelial detachment, macular pucker (stable ERM; bullous schisis IT quad caught on widefield --not imaged today).   Left Eye Quality was good. Central Foveal Thickness: 317. Progression has been stable. Findings include epiretinal membrane, retinal drusen , preretinal fibrosis, abnormal foveal contour, no SRF, intraretinal fluid (Stable improvement in central cystic changes, persistent ERM with PRF).   Notes *Images captured and stored on drive  Diagnosis / Impression:  OD: Stable ERM & retinal drusen OS: stable improvement in central cystic changes, persistent ERM with PRF  Clinical management:  See below  Abbreviations: NFP - Normal foveal profile. CME - cystoid macular edema. PED - pigment epithelial detachment. IRF - intraretinal fluid. SRF - subretinal fluid. EZ - ellipsoid zone. ERM - epiretinal membrane. ORA - outer retinal atrophy. ORT - outer retinal tubulation. SRHM - subretinal  hyper-reflective material      Color Fundus Photography Optos - OU - Both Eyes       Right Eye Progression has improved. Disc findings include normal observations. Macula : retinal pigment epithelium abnormalities. Vessels : attenuated, tortuous vessels. Periphery : RPE abnormality, hemorrhage, exudates (Peripheral subretinal hemes IT periphery -- surrounding exudates improving).   Left Eye Progression has been stable. Disc findings include normal observations. Macula : normal observations. Vessels : attenuated. Periphery : RPE abnormality, hemorrhage (improvement in sub retinal hemorrhage temporally (0200) -- SRH now white and pigment clumping surrounding).   Notes **Images stored on drive**  Impression: OD: IT schisis -- stable, persistent +heme -- PEHCR --  surrounding exudates improving OS: PEHCR with stably improved sub retinal hemorrhage temporally (0200)       Intravitreal Injection, Pharmacologic Agent - OD - Right Eye       Time Out 01/15/2021. 2:00 PM. Confirmed correct patient, procedure, site, and patient consented.   Anesthesia Topical anesthesia was used. Anesthetic medications included Lidocaine 2%, Proparacaine 0.5%.   Procedure Preparation included 5% betadine to ocular surface, eyelid speculum. A (32g) needle was used.   Injection: 1.25 mg Bevacizumab 1.25mg /0.48ml   Route: Intravitreal, Site: Right Eye   NDC: [id number], Lot: [id number], Expiration date: 02/11/2021, Waste: 0 mL   Post-op Post injection exam found visual acuity of at least counting fingers. The patient tolerated the procedure well. There were no complications. The patient received written and verbal post procedure care education. Post injection medications were not given.            ASSESSMENT/PLAN:    ICD-10-CM   1. Exudative age-related macular degeneration of both eyes with active choroidal neovascularization (HCC)  H35.3231 OCT, Retina - OU - Both Eyes    Intravitreal Injection,  Pharmacologic Agent - OD - Right Eye    Bevacizumab (AVASTIN) SOLN 1.25 mg    2. Subretinal hemorrhage of both eyes  H35.63 OCT, Retina - OU - Both Eyes    Color Fundus Photography Optos - OU - Both Eyes    3. Serous retinal detachment of left eye  H33.22     4. 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 Essential hypertension  I10     7. Hypertensive retinopathy of both eyes  H35.033     8. Pseudophakia of both eyes  Z96.1      1-3. 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 Pearletha Furl, Ocular Oncologist, on 9.22.2020 -- no tumor on exam or b-scan  - s/p IVA OS #1 (08.05.20) for subretinal hemorrhage, peripheral CNV  - s/p  IVA OD #1 (11.18.22), #2 (12.16.22)  - s/p IVE OS #1 (09.02.20) - sample, #2 (10.12.20), #3 (11.11.20), #4 (12.09.20), #5 (1.20.21), #6 (02.17.21), #7 (03.19.21), #8 (04.30.21), #9 (06.16.21), #10 (08.11.21), #11 (12.01.21), #12 (01.26.22), #13 (03.30.22), #14 (06.01.22), #15 (08.10.22), #16 (09.23.22), #17 (11.18.22)  - today BCVA 20/25 OD - stable; OS 20/30 - decreased  - exam shows OD: interval improvement in [facility] and exudates IT periphery; OS: stable improvement in subretinal heme and shallow SRF OS  - OCT shows stable improvement in central cystic changes/IRF OS  - dec Prolensa and PF to Qdaily OS  - recommend IVA OD #3 today, 01.13.23  - pt wishes to proceed with injection  - RBA of procedure discussed, questions answered   - see procedure note  - IVA OD informed consent obtained  and signed, 11.18.22  - [employer] informed consent form signed and scanned on 01.20.2021  - [id number] Benefits investigation initiated 09.02.2020 -- approved for 2021  - f/u in 4 wks, sooner prn, OCT/DFE/optos colors, possible injections  4. Epiretinal membrane, OU.  - mild ERM OU  - asymptomatic, no metamorphopsia  - no indication for surgery at this time  - monitor for now  5. Retinoschisis OD -- stable ?less prominent  - bullous schisis cavity, IT quadrant, from 0700-0830, -- ?less posterior extension  - no associated RT/RD but now with [facility] and exudates -- improving   - discussed findings and prognosis  - recommend monitoring for now  - if progresses posteriorly, will consider intervention  6,7. Hypertensive retinopathy OU  - discussed importance of tight BP control  - monitor   8. Pseudophakia OU  - s/p CE/IOL OU  - beautiful surgery, doing well  - monitor   Ophthalmic Meds Ordered this visit:  Meds ordered this encounter  Medications   Bevacizumab (AVASTIN) SOLN 1.25 mg     Return in about 4 weeks (around 02/12/2021) for f/u exu ARMD OU, DFE, OCT.  There are no Patient Instructions on file for this visit.   Explained the diagnoses, plan, and follow up with the patient and they expressed understanding.  Patient expressed understanding of the importance of proper follow up care.  This document serves as a record of services personally performed by Karie Chimera, MD, PhD. It was created on their behalf by Joni Reining, an ophthalmic technician. The creation of this record is the provider's dictation and/or activities during the visit.    Electronically signed by: Joni Reining COA, 01/15/21  2:17 PM  This document serves as a record of services personally performed by Karie Chimera, MD, PhD. It was created on their behalf by Glee Arvin. Manson Passey, OA an ophthalmic technician. The creation of this record is the provider's dictation and/or activities during the visit.     Electronically signed by: Glee Arvin. Manson Passey, [place] 01.13.2023 2:17 PM  Karie Chimera, M.D., Ph.D. Diseases & Surgery of the Retina and Vitreous [facility] 01/15/2021  I have reviewed the above documentation for accuracy and completeness, and I agree with the above. Karie Chimera, M.D., Ph.D. 01/15/21 2:19 PM   Abbreviations: M myopia (nearsighted); A astigmatism; H hyperopia (farsighted); P presbyopia; Mrx spectacle prescription;  CTL contact lenses; OD right eye; OS left eye; OU both eyes  XT exotropia; ET esotropia; PEK punctate epithelial keratitis; PEE punctate epithelial erosions; DES dry eye syndrome; MGD meibomian gland dysfunction; ATs artificial tears; PFAT's preservative free artificial tears; NSC nuclear sclerotic cataract; PSC posterior subcapsular  cataract; ERM epi-retinal membrane; PVD posterior vitreous detachment; RD retinal detachment; DM diabetes mellitus; DR diabetic retinopathy; NPDR non-proliferative diabetic retinopathy; PDR proliferative diabetic retinopathy; CSME clinically significant macular edema; DME diabetic macular edema; dbh dot blot hemorrhages; CWS cotton wool spot; POAG primary open angle glaucoma; C/D cup-to-disc ratio; HVF humphrey visual field; GVF goldmann visual field; OCT optical coherence tomography; IOP intraocular pressure; BRVO Branch retinal vein occlusion; CRVO central retinal vein occlusion; CRAO central retinal artery occlusion; BRAO branch retinal artery occlusion; RT retinal tear; SB scleral buckle; PPV pars plana vitrectomy; VH Vitreous hemorrhage; PRP panretinal laser photocoagulation; IVK intravitreal kenalog; VMT vitreomacular traction; MH Macular hole;  NVD neovascularization of the disc; NVE neovascularization elsewhere; AREDS age related eye disease study; ARMD age related macular degeneration; POAG primary open angle glaucoma; EBMD epithelial/anterior basement membrane dystrophy; ACIOL anterior chamber intraocular lens;  IOL intraocular lens; PCIOL posterior chamber intraocular lens; Phaco/IOL phacoemulsification with intraocular lens placement; PRK photorefractive keratectomy; LASIK laser assisted in situ keratomileusis; HTN hypertension; DM diabetes mellitus; COPD chronic obstructive pulmonary disease

## 2021-01-15 ENCOUNTER — Encounter (INDEPENDENT_AMBULATORY_CARE_PROVIDER_SITE_OTHER): Payer: Self-pay | Admitting: Ophthalmology

## 2021-01-15 ENCOUNTER — Other Ambulatory Visit: Payer: Self-pay

## 2021-01-15 ENCOUNTER — Ambulatory Visit (INDEPENDENT_AMBULATORY_CARE_PROVIDER_SITE_OTHER): Payer: Medicare Other | Admitting: Ophthalmology

## 2021-01-15 DIAGNOSIS — H35373 Puckering of macula, bilateral: Secondary | ICD-10-CM | POA: Diagnosis not present

## 2021-01-15 DIAGNOSIS — H3322 Serous retinal detachment, left eye: Secondary | ICD-10-CM | POA: Diagnosis not present

## 2021-01-15 DIAGNOSIS — H35033 Hypertensive retinopathy, bilateral: Secondary | ICD-10-CM | POA: Diagnosis not present

## 2021-01-15 DIAGNOSIS — Z961 Presence of intraocular lens: Secondary | ICD-10-CM

## 2021-01-15 DIAGNOSIS — I10 Essential (primary) hypertension: Secondary | ICD-10-CM | POA: Diagnosis not present

## 2021-01-15 DIAGNOSIS — H353231 Exudative age-related macular degeneration, bilateral, with active choroidal neovascularization: Secondary | ICD-10-CM | POA: Diagnosis not present

## 2021-01-15 DIAGNOSIS — H3563 Retinal hemorrhage, bilateral: Secondary | ICD-10-CM | POA: Diagnosis not present

## 2021-01-15 DIAGNOSIS — H33101 Unspecified retinoschisis, right eye: Secondary | ICD-10-CM

## 2021-01-15 MED ORDER — BEVACIZUMAB CHEMO INJECTION 1.25MG/0.05ML SYRINGE FOR KALEIDOSCOPE
1.2500 mg | INTRAVITREAL | Status: AC | PRN
  Administered 2021-01-15: 1.25 mg via INTRAVITREAL

## 2021-02-10 NOTE — Progress Notes (Signed)
[facility] Note  02/12/2021     CHIEF COMPLAINT Patient presents for Retina Follow Up  HISTORY OF PRESENT ILLNESS: Tracey Rosario is a 88 y.o. female who presents to the clinic today for:   HPI     Retina Follow Up   Patient presents with  Wet AMD.  In both eyes.  Severity is moderate.  Duration of 4 weeks.  Since onset it is stable.  I, the attending physician,  performed the HPI with the patient and updated documentation appropriately.        Comments   Pt here for 4 wk ret f/u exu ARMD OU. Pt states vision is the same, no change. No ocular pain or discomfort.       Last edited by Bernarda Caffey, MD on 02/12/2021  3:50 PM.    Pt states vision is about the same  Referring physician: Simona Huh, NP [place],  [place] [id number]  HISTORICAL INFORMATION:   Selected notes from the MEDICAL RECORD NUMBER Referred by Dr. Quentin Ore for retinal eval   CURRENT MEDICATIONS: Current Outpatient Medications (Ophthalmic Drugs)  Medication Sig   Bromfenac Sodium (PROLENSA) 0.07 % SOLN Place 1 drop into the left eye 4 (four) times daily.   polyvinyl alcohol (LIQUIFILM TEARS) 1.4 % ophthalmic solution Place 1 drop into both eyes as needed for dry eyes.   prednisoLONE acetate (PRED FORTE) 1 % ophthalmic suspension Place 1 drop into the left eye in the morning and at bedtime.   RESTASIS 0.05 % ophthalmic emulsion    prednisoLONE acetate (PRED FORTE) 1 % ophthalmic suspension Place 1 drop into the left eye 4 (four) times daily. (Patient not taking: Reported on 12/18/2020)   No current facility-administered medications for this visit. (Ophthalmic Drugs)   Current Outpatient Medications (Other)  Medication Sig   ALPRAZolam (XANAX) 0.25 MG tablet    amLODipine (NORVASC) 10 MG tablet Take 1 tablet by mouth daily.   amLODipine (NORVASC) 5 MG tablet Take 5 mg by mouth daily.   aspirin EC 81 MG tablet Take 1 tablet (81 mg total) by  mouth daily.   Biotin 1000 MCG tablet Take by mouth.   cephALEXin (KEFLEX) 500 MG capsule    Cholecalciferol (VITAMIN D3) 125 MCG (5000 UT) TABS Take 5,000 Units by mouth daily.    citalopram (CELEXA) 20 MG tablet Take 20 mg by mouth daily.   COVID-19 mRNA vaccine, Pfizer, 30 MCG/0.3ML injection    donepezil (ARICEPT) 10 MG tablet Take 10 mg by mouth daily.   estradiol (ESTRACE) 0.1 MG/GM vaginal cream    ferrous gluconate (FERGON) 324 MG tablet Take 324 mg by mouth daily.   gabapentin (NEURONTIN) 100 MG capsule Take 200 mg by mouth 2 (two) times daily.   gabapentin (NEURONTIN) 300 MG capsule    levothyroxine (SYNTHROID) 50 MCG tablet Take 50 mcg by mouth daily before breakfast.   levothyroxine (SYNTHROID) 75 MCG tablet Take 75 mcg by mouth daily.   lisinopril (PRINIVIL,ZESTRIL) 30 MG tablet TAKE 1 TABLET DAILY (NEED AN APPOINTMENT)   lisinopril (ZESTRIL) 10 MG tablet Take 10 mg by mouth daily.   meloxicam (MOBIC) 7.5 MG tablet Take 7.5 mg by mouth daily.    methocarbamol (ROBAXIN) 500 MG tablet Take 1 tablet (500 mg total) by mouth 2 (two) times daily as needed.   Multiple Vitamins-Minerals (CENTRUM SILVER) tablet Take 1 tablet by mouth daily.   NONFORMULARY OR COMPOUNDED ITEM Peripheral Neuropathy Cream:  Bupivacaine 1%, Doxepin 3%, Gabapentin 6%, Pentoxifylline 3%, Topiramate 1% Order faxed to [facility]   nystatin (MYCOSTATIN) 100000 UNIT/ML suspension    omeprazole (PRILOSEC) 20 MG capsule TAKE 1 CAPSULE DAILY   oxybutynin (DITROPAN-XL) 5 MG 24 hr tablet Take 5 mg by mouth daily.   pravastatin (PRAVACHOL) 20 MG tablet    predniSONE (STERAPRED UNI-PAK 21 TAB) 5 MG (21) TBPK tablet    Probiotic Product (PROBIOTIC PO) Take 1 tablet by mouth daily.   No current facility-administered medications for this visit. (Other)   REVIEW OF SYSTEMS: ROS   Positive for: Gastrointestinal, Neurological, Genitourinary, Musculoskeletal, Cardiovascular, Eyes Negative for: Constitutional,  Skin, HENT, Endocrine, Respiratory, Psychiatric, Allergic/Imm, Heme/Lymph Last edited by Kingsley Spittle, COT on 02/12/2021 12:59 PM.     ALLERGIES Allergies  Allergen Reactions   Codeine Other (See Comments)    Makes her crazy   PAST MEDICAL HISTORY Past Medical History:  Diagnosis Date   Acid reflux    Anemia    Arthropathy    Bradycardia    beta blocker reduced in 2021 due to this   CKD (chronic kidney disease), stage III ([place])    Cognitive decline    referred to neurologist   Depression 10/07/2013   daughter didn't know anything about this   Dizziness    Frequent PVCs    GERD (gastroesophageal reflux disease) 05/04/2013   Hip fx ([place])    r hip, bilat pubis rami   Hyperlipemia    Hypertension    Hypertensive retinopathy    OU   Hypothyroidism    Insomnia    Macular degeneration    Exu OS   Multilevel degenerative disc disease    Palpitations 05/04/2013   saw cardiologist in 2015, he deleted A. Fib from the record as per note not documentation to support   Premature atrial contractions    PSVT (paroxysmal supraventricular tachycardia) ([place])    a. short runs by monitor in 2021.   Short-term memory loss    Vitamin D deficiency    Past Surgical History:  Procedure Laterality Date   ABDOMINAL HYSTERECTOMY  1971   BLADDER SURGERY  2010   Broken Pelvis  2015   C-EYE SURGERY PROCEDURE Bilateral    [facility]   CATARACT EXTRACTION Bilateral    Done in [place] Bilateral    Cat Sx   HIP PINNING,CANNULATED Right 10/07/2013   Procedure: CANNULATED screws right hip;  Surgeon: Mcarthur Rossetti, MD;  Location: [facility];  Service: Orthopedics;  Laterality: Right;   FAMILY HISTORY Family History  Problem Relation Age of Onset   Prostate cancer Father    Heart attack Sister    Cancer Brother    Cancer Sister    Heart attack Sister    Cancer Sister    Stroke Daughter    Dementia Neg Hx    Alzheimer's disease Neg Hx    SOCIAL HISTORY Social History    Tobacco Use   Smoking status: Former    Types: Cigarettes    Quit date: 08/02/1970    Years since quitting: 50.5   Smokeless tobacco: Never   Tobacco comments:    "smoked very little"  Vaping Use   Vaping Use: Never used  Substance Use Topics   Alcohol use: Never    Alcohol/week: 0.0 standard drinks   Drug use: Never       OPHTHALMIC EXAM:  Base Eye Exam     Visual Acuity (Snellen - Linear)  Right Left   Dist [place] 20/25 20/70   Dist ph [place] NI NI         Tonometry (Tonopen, 1:11 PM)       Right Left   Pressure 13 14         Pupils       Dark Light Shape React APD   Right 3 2 Round Brisk None   Left 3 2 Round Brisk None         Visual Fields (Counting fingers)       Left Right    Full Full         Extraocular Movement       Right Left    Full, Ortho Full, Ortho         Neuro/Psych     Oriented x3: Yes   Mood/Affect: Normal         Dilation     Both eyes: 1.0% Mydriacyl, 2.5% Phenylephrine @ 1:11 PM           Slit Lamp and Fundus Exam     Slit Lamp Exam       Right Left   Lids/Lashes Dermatochalasis - upper lid Dermatochalasis - upper lid   Conjunctiva/Sclera White and quiet White and quiet   Cornea Arcus, 1-2+ Punctate epithelial erosions, mild EBMD and corneal haze Arcus, 2-3+ Punctate epithelial erosions, EBMD, irregular epi   Anterior Chamber Deep and quiet Deep and quiet   Iris Round and dilated Round and moderately dilated to 57mm   Lens PC IOL in good position with open PC, anterior capsular phimosis PC IOL in good position with open PC   Anterior Vitreous Vitreous syneresis, Posterior vitreous detachment Vitreous syneresis; PVD; vitreous condensations         Fundus Exam       Right Left   Disc Mild temporal Pallor, Peripapillary atrophy, Sharp rim, Compact mild tilt, 2+ temporal Pallor, Peripapillary atrophy, Compact, Sharp rim   C/D Ratio 0.1 0.0   Macula Flat, Blunted foveal reflex, mild Epiretinal  membrane, drusen, RPE mottling and clumping, No heme or edema Flat, Blunted foveal reflex, RPE mottling and clumping, Epiretinal membrane, interval re-development of central cystic changes, No heme   Vessels attenuated, Tortuous attenuated, mild tortuousity   Periphery bullous IT schisis cavity from 0700-0830 extending posteriorly almost to arcades; otherwise attached; interval improvement in [facility] and exudation under schisis cavities (SRH turning white, exudation decreasing), focal pigmented CR scar at 0630 temporal sub-retinal hemorrhage improved - now white with surrounding pigment clumping;  No SRF -- completely resolved, focal, punctate IRH - improved, shallow schisis IT quad, No heme           Refraction     Manifest Refraction       Sphere Cylinder Axis Dist [place]   Right       Left [place] +1.00 180 20/50-1            IMAGING AND PROCEDURES  Imaging and Procedures for $RemoveBefore'@TODAY'[id number]$ @  OCT, Retina - OU - Both Eyes       Right Eye Quality was good. Central Foveal Thickness: 332. Progression has been stable. Findings include epiretinal membrane, normal foveal contour, no IRF, no SRF, retinal drusen , pigment epithelial detachment, macular pucker (stable ERM; bullous schisis with PEDs within IT quad caught on widefield).   Left Eye Quality was good. Central Foveal Thickness: 461. Progression has worsened. Findings include epiretinal membrane, retinal drusen , preretinal fibrosis, abnormal foveal contour, no SRF,  intraretinal fluid (Interval re-development of central cystic changes/edema, persistent ERM with PRF).   Notes *Images captured and stored on drive  Diagnosis / Impression:  OD: stable ERM; bullous schisis with PEDs within IT quad caught on widefield OS: Interval re-development of central cystic changes/edema, persistent ERM with PRF  Clinical management:  See below  Abbreviations: NFP - Normal foveal profile. CME - cystoid macular edema. PED - pigment epithelial detachment.  IRF - intraretinal fluid. SRF - subretinal fluid. EZ - ellipsoid zone. ERM - epiretinal membrane. ORA - outer retinal atrophy. ORT - outer retinal tubulation. SRHM - subretinal hyper-reflective material      Intravitreal Injection, Pharmacologic Agent - OD - Right Eye       Time Out 02/12/2021. 1:57 PM. Confirmed correct patient, procedure, site, and patient consented.   Anesthesia Topical anesthesia was used. Anesthetic medications included Lidocaine 2%, Proparacaine 0.5%.   Procedure Preparation included 5% betadine to ocular surface, eyelid speculum. A (32g) needle was used.   Injection: 1.25 mg Bevacizumab 1.25mg /0.86ml   Route: Intravitreal, Site: Right Eye   NDC: H061816, Lot: [id number] , Expiration date: 03/17/2021   Post-op Post injection exam found visual acuity of at least counting fingers. The patient tolerated the procedure well. There were no complications. The patient received written and verbal post procedure care education. Post injection medications were not given.      Intravitreal Injection, Pharmacologic Agent - OS - Left Eye       Time Out 02/12/2021. 1:58 PM. Confirmed correct patient, procedure, site, and patient consented.   Anesthesia Topical anesthesia was used. Anesthetic medications included Proparacaine 0.5%, Lidocaine 2%.   Procedure Preparation included 5% betadine to ocular surface, eyelid speculum. A (32g) needle was used.   Injection: 2 mg aflibercept 2 MG/0.05ML   Route: Intravitreal, Site: Left Eye   NDC: 15/10/2021, Lot: [id number], Expiration date: 01/02/2022, Waste: 0.05 mL   Post-op Post injection exam found visual acuity of at least counting fingers. The patient tolerated the procedure well. There were no complications. The patient received written and verbal post procedure care education. Post injection medications were not given.            ASSESSMENT/PLAN:    ICD-10-CM   1. Exudative age-related macular  degeneration of both eyes with active choroidal neovascularization (HCC)  H35.3231 OCT, Retina - OU - Both Eyes    Intravitreal Injection, Pharmacologic Agent - OD - Right Eye    Intravitreal Injection, Pharmacologic Agent - OS - Left Eye    aflibercept (EYLEA) SOLN 2 mg    Bevacizumab (AVASTIN) SOLN 1.25 mg    2. Subretinal hemorrhage of both eyes  H35.63     3. Serous retinal detachment of left eye  H33.22     4. 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 Essential hypertension  I10     7. Hypertensive retinopathy of both eyes  H35.033     8. Pseudophakia of both eyes  Z96.1      1-3. Exudative age related macular degeneration / PECHR OU  - interval conversion to exu ARMD OD noted on 11.18.22 exam -- new SRH and exudates IT periphery  - initially presented with symptomatic floaters OS  - initial exam showed large peripheral temporal subretinal hemorrhage OS -- spanning 2-430 -- also mild vitreous opacities and condensations that were causing symptomatic floaters  - B-scan (9.18.20) showed hyperreflective, ill-defined mass, 0300 periphery OS  -  was seen by Dr. Daralene Milch, Ocular Oncologist, on 9.22.2020 -- no tumor on exam or b-scan  - s/p IVA OS #1 (08.05.20) for subretinal hemorrhage, peripheral CNV  - s/p IVA OD #1 (11.18.22), #2 (12.16.22), #3 (01.13.23)  - s/p IVE OS #1 (09.02.20) - sample, #2 (10.12.20), #3 (11.11.20), #4 (12.09.20), #5 (1.20.21), #6 (02.17.21), #7 (03.19.21), #8 (04.30.21), #9 (06.16.21), #10 (08.11.21), #11 (12.01.21), #12 (01.26.22), #13 (03.30.22), #14 (06.01.22), #15 (08.10.22), #16 (09.23.22), #17 (11.18.22)  - today BCVA 20/25 OD - stable; OS 20/70 - decreased from 20/30  - exam shows improving peripheral SRH OD and interval redevelopment of central edema OS  - OCT shows OD: stable ERM; bullous schisis with PEDs within, IT quad caught on widefield; OS: Interval re-development of central cystic changes/edema,  persistent ERM with PRF  - increase Prolensa and PF to QID OS for possible CME component  - recommend IVA OD #4 and IVE OS #18 today, 02.10.23  - pt wishes to proceed with injection  - RBA of procedure discussed, questions answered   - see procedure note  - IVA OD informed consent obtained and signed, 11.18.22  - Eylea informed consent form signed and scanned on 01.20.2021  - Eylea[employer] Benefits investigation initiated 09.02.2020 -- approved for 2021  - f/u in 5-6 wks, sooner prn, OCT/DFE/optos colors, possible injections  4. Epiretinal membrane, OU.  - mild ERM OU  - asymptomatic, no metamorphopsia  - no indication for surgery at this time  - monitor for now  5. Retinoschisis OD -- stable  - bullous schisis cavity, IT quadrant, from 0700-0830, -- ?less posterior extension  - no associated RT/RD but now with [facility] and exudates -- improving   - discussed findings and prognosis  - monitor  6,7. Hypertensive retinopathy OU  - discussed importance of tight BP control  - monitor   8. Pseudophakia OU  - s/p CE/IOL OU  - beautiful surgery, doing well  - monitor   Ophthalmic Meds Ordered this visit:  Meds ordered this encounter  Medications   aflibercept (EYLEA) SOLN 2 mg   Bevacizumab (AVASTIN) SOLN 1.25 mg     Return for f/u 5-6 weeks, exu ARMD OU, DFE, OCT.  There are no Patient Instructions on file for this visit.   Explained the diagnoses, plan, and follow up with the patient and they expressed understanding.  Patient expressed understanding of the importance of proper follow up care.  This document serves as a record of services personally performed by Gardiner Sleeper, MD, PhD. It was created on their behalf by Leonie Douglas, an ophthalmic technician. The creation of this record is the provider's dictation and/or activities during the visit.    Electronically signed by: Leonie Douglas COA, 02/12/21  3:51 PM  This document serves as a record of services personally performed by  Gardiner Sleeper, MD, PhD. It was created on their behalf by San Jetty. Owens Shark, OA an ophthalmic technician. The creation of this record is the provider's dictation and/or activities during the visit.    Electronically signed by: San Jetty. Owens Shark, [place] 02.10.2023 3:51 PM  Gardiner Sleeper, M.D., Ph.D. Diseases & Surgery of the Retina and Vitreous [facility]  I have reviewed the above documentation for accuracy and completeness, and I agree with the above. Gardiner Sleeper, M.D., Ph.D. 02/12/21 3:54 PM   Abbreviations: M myopia (nearsighted); A astigmatism; H hyperopia (farsighted); P presbyopia; Mrx spectacle prescription;  CTL contact lenses; OD right eye; OS  left eye; OU both eyes  XT exotropia; ET esotropia; PEK punctate epithelial keratitis; PEE punctate epithelial erosions; DES dry eye syndrome; MGD meibomian gland dysfunction; ATs artificial tears; PFAT's preservative free artificial tears; [place] nuclear sclerotic cataract; PSC posterior subcapsular cataract; ERM epi-retinal membrane; PVD posterior vitreous detachment; RD retinal detachment; DM diabetes mellitus; DR diabetic retinopathy; NPDR non-proliferative diabetic retinopathy; PDR proliferative diabetic retinopathy; CSME clinically significant macular edema; DME diabetic macular edema; dbh dot blot hemorrhages; CWS cotton wool spot; POAG primary open angle glaucoma; C/D cup-to-disc ratio; HVF humphrey visual field; GVF goldmann visual field; OCT optical coherence tomography; IOP intraocular pressure; BRVO Branch retinal vein occlusion; CRVO central retinal vein occlusion; CRAO central retinal artery occlusion; BRAO branch retinal artery occlusion; RT retinal tear; SB scleral buckle; PPV pars plana vitrectomy; VH Vitreous hemorrhage; PRP panretinal laser photocoagulation; IVK intravitreal kenalog; VMT vitreomacular traction; MH Macular hole;  NVD neovascularization of the disc; NVE neovascularization elsewhere; AREDS age related  eye disease study; ARMD age related macular degeneration; POAG primary open angle glaucoma; EBMD epithelial/anterior basement membrane dystrophy; ACIOL anterior chamber intraocular lens; IOL intraocular lens; PCIOL posterior chamber intraocular lens; Phaco/IOL phacoemulsification with intraocular lens placement; [place] photorefractive keratectomy; LASIK laser assisted in situ keratomileusis; HTN hypertension; DM diabetes mellitus; COPD chronic obstructive pulmonary disease

## 2021-02-12 ENCOUNTER — Encounter (INDEPENDENT_AMBULATORY_CARE_PROVIDER_SITE_OTHER): Payer: Self-pay | Admitting: Ophthalmology

## 2021-02-12 ENCOUNTER — Other Ambulatory Visit: Payer: Self-pay

## 2021-02-12 ENCOUNTER — Ambulatory Visit (INDEPENDENT_AMBULATORY_CARE_PROVIDER_SITE_OTHER): Payer: Medicare Other | Admitting: Ophthalmology

## 2021-02-12 DIAGNOSIS — H35373 Puckering of macula, bilateral: Secondary | ICD-10-CM

## 2021-02-12 DIAGNOSIS — I10 Essential (primary) hypertension: Secondary | ICD-10-CM

## 2021-02-12 DIAGNOSIS — H3563 Retinal hemorrhage, bilateral: Secondary | ICD-10-CM | POA: Diagnosis not present

## 2021-02-12 DIAGNOSIS — H3322 Serous retinal detachment, left eye: Secondary | ICD-10-CM

## 2021-02-12 DIAGNOSIS — H35033 Hypertensive retinopathy, bilateral: Secondary | ICD-10-CM | POA: Diagnosis not present

## 2021-02-12 DIAGNOSIS — H353231 Exudative age-related macular degeneration, bilateral, with active choroidal neovascularization: Secondary | ICD-10-CM

## 2021-02-12 DIAGNOSIS — H33101 Unspecified retinoschisis, right eye: Secondary | ICD-10-CM

## 2021-02-12 DIAGNOSIS — Z961 Presence of intraocular lens: Secondary | ICD-10-CM

## 2021-02-12 MED ORDER — BEVACIZUMAB CHEMO INJECTION 1.25MG/0.05ML SYRINGE FOR KALEIDOSCOPE
1.2500 mg | INTRAVITREAL | Status: AC | PRN
  Administered 2021-02-12: 1.25 mg via INTRAVITREAL

## 2021-02-12 MED ORDER — AFLIBERCEPT 2MG/0.05ML IZ SOLN FOR KALEIDOSCOPE
2.0000 mg | INTRAVITREAL | Status: AC | PRN
  Administered 2021-02-12: 2 mg via INTRAVITREAL

## 2021-02-15 ENCOUNTER — Other Ambulatory Visit (INDEPENDENT_AMBULATORY_CARE_PROVIDER_SITE_OTHER): Payer: Self-pay

## 2021-02-15 MED ORDER — PREDNISOLONE ACETATE 1 % OP SUSP: 1.0000 [drp] | Freq: Four times a day (QID) | OPHTHALMIC | 1 refills | Status: DC

## 2021-03-18 ENCOUNTER — Other Ambulatory Visit (INDEPENDENT_AMBULATORY_CARE_PROVIDER_SITE_OTHER): Payer: Self-pay | Admitting: Ophthalmology

## 2021-03-22 ENCOUNTER — Other Ambulatory Visit (INDEPENDENT_AMBULATORY_CARE_PROVIDER_SITE_OTHER): Payer: Self-pay | Admitting: Ophthalmology

## 2021-03-23 NOTE — Progress Notes (Signed)
?[facility] - Clinic Note ? ?03/26/2021 ? ?  ? ?CHIEF COMPLAINT ?Patient presents for Retina Follow Up ? ?HISTORY OF PRESENT ILLNESS: ?Tracey Rosario is a 88 y.o. female who presents to the clinic today for:  ? ?HPI   ? ? Retina Follow Up   ?Patient presents with  Wet AMD.  In both eyes.  Duration of 6 weeks.  Since onset it is stable.  I, the attending physician,  performed the HPI with the patient and updated documentation appropriately. ? ?  ?  ? ? Comments   ?6 week follow up Exu ARMD OU-  No changes since last visit.  Vision appears stable OU.  Taking Prednisolone and Prolensa QID OS. ? ?  ?  ?Last edited by Rennis Chris, MD on 03/26/2021 11:35 PM.  ?  ? ?Pt states vision is about the same ? ?Referring physician: ?Courtney Paris, NP ?[address] ?[place],  [place] [address] ? ?HISTORICAL INFORMATION:  ? ?Selected notes from the MEDICAL RECORD NUMBER ?Referred by Dr. Baker Pierini for retinal eval  ? ?CURRENT MEDICATIONS: ?Current Outpatient Medications (Ophthalmic Drugs)  ?Medication Sig  ? polyvinyl alcohol (LIQUIFILM TEARS) 1.4 % ophthalmic solution Place 1 drop into both eyes as needed for dry eyes.  ? prednisoLONE acetate (PRED FORTE) 1 % ophthalmic suspension Place 1 drop into the left eye 4 (four) times daily.  ? Bromfenac Sodium (PROLENSA) 0.07 % SOLN Place 1 drop into the left eye 4 (four) times daily.  ? prednisoLONE acetate (PRED FORTE) 1 % ophthalmic suspension Place 1 drop into the left eye in the morning and at bedtime.  ? RESTASIS 0.05 % ophthalmic emulsion  (Patient not taking: Reported on 03/26/2021)  ? ?No current facility-administered medications for this visit. (Ophthalmic Drugs)  ? ?Current Outpatient Medications (Other)  ?Medication Sig  ? ALPRAZolam (XANAX) 0.25 MG tablet   ? amLODipine (NORVASC) 10 MG tablet Take 1 tablet by mouth daily.  ? amLODipine (NORVASC) 5 MG tablet Take 5 mg by mouth daily.  ? aspirin EC 81 MG tablet Take 1 tablet (81 mg total) by mouth  daily.  ? Biotin 1000 MCG tablet Take by mouth.  ? Cholecalciferol (VITAMIN D3) 125 MCG (5000 UT) TABS Take 5,000 Units by mouth daily.   ? citalopram (CELEXA) 20 MG tablet Take 20 mg by mouth daily.  ? donepezil (ARICEPT) 10 MG tablet Take 10 mg by mouth daily.  ? estradiol (ESTRACE) 0.1 MG/GM vaginal cream   ? ferrous gluconate (FERGON) 324 MG tablet Take 324 mg by mouth daily.  ? gabapentin (NEURONTIN) 100 MG capsule Take 200 mg by mouth 2 (two) times daily.  ? gabapentin (NEURONTIN) 300 MG capsule   ? levothyroxine (SYNTHROID) 50 MCG tablet Take 50 mcg by mouth daily before breakfast.  ? levothyroxine (SYNTHROID) 75 MCG tablet Take 75 mcg by mouth daily.  ? lisinopril (PRINIVIL,ZESTRIL) 30 MG tablet TAKE 1 TABLET DAILY (NEED AN APPOINTMENT)  ? lisinopril (ZESTRIL) 10 MG tablet Take 10 mg by mouth daily.  ? meloxicam (MOBIC) 7.5 MG tablet Take 7.5 mg by mouth daily.   ? methocarbamol (ROBAXIN) 500 MG tablet Take 1 tablet (500 mg total) by mouth 2 (two) times daily as needed.  ? Multiple Vitamins-Minerals (CENTRUM SILVER) tablet Take 1 tablet by mouth daily.  ? NONFORMULARY OR COMPOUNDED ITEM Peripheral Neuropathy Cream: ?Bupivacaine 1%, Doxepin 3%, Gabapentin 6%, Pentoxifylline 3%, Topiramate 1% ?Order fax[facility] to [facility]  ? nystatin (MYCOSTATIN) 100000 UNIT/ML  suspension   ? omeprazole (PRILOSEC) 20 MG capsule TAKE 1 CAPSULE DAILY  ? oxybutynin (DITROPAN-XL) 5 MG 24 hr tablet Take 5 mg by mouth daily.  ? pravastatin (PRAVACHOL) 20 MG tablet   ? Probiotic Product (PROBIOTIC PO) Take 1 tablet by mouth daily.  ? cephALEXin (KEFLEX) 500 MG capsule  (Patient not taking: Reported on 03/26/2021)  ? COVID-19 mRNA vaccine, Pfizer, 30 MCG/0.3ML injection   ? predniSONE (STERAPRED UNI-PAK 21 TAB) 5 MG (21) TBPK tablet  (Patient not taking: Reported on 03/26/2021)  ? ?No current facility-administered medications for this visit. (Other)  ? ?REVIEW OF SYSTEMS: ?ROS   ?Positive for: Gastrointestinal, Neurological,  Genitourinary, Musculoskeletal, Cardiovascular, Eyes ?Negative for: Constitutional, Skin, HENT, Endocrine, Respiratory, Psychiatric, Allergic/Imm, Heme/Lymph ?Last edited by Joni Reining, COA on 03/26/2021  1:28 PM.  ?  ? ? ?ALLERGIES ?Allergies  ?Allergen Reactions  ? Codeine Other (See Comments)  ?  Makes her crazy  ? ?PAST MEDICAL HISTORY ?Past Medical History:  ?Diagnosis Date  ? Acid reflux   ? Anemia   ? Arthropathy   ? Bradycardia   ? beta blocker reduced in 2021 due to this  ? CKD (chronic kidney disease), stage III (HCC)   ? Cognitive decline   ? referred to neurologist  ? Depression 10/07/2013  ? daughter didn't know anything about this  ? Dizziness   ? Frequent PVCs   ? GERD (gastroesophageal reflux disease) 05/04/2013  ? Hip fx (HCC)   ? r hip, bilat pubis rami  ? Hyperlipemia   ? Hypertension   ? Hypertensive retinopathy   ? OU  ? Hypothyroidism   ? Insomnia   ? Macular degeneration   ? Exu OS  ? Multilevel degenerative disc disease   ? Palpitations 05/04/2013  ? saw cardiologist in 2015, he deleted A. Fib from the record as per note not documentation to support  ? Premature atrial contractions   ? PSVT (paroxysmal supraventricular tachycardia) (HCC)   ? a. short runs by monitor in 2021.  ? Short-term memory loss   ? Vitamin D deficiency   ? ?Past Surgical History:  ?Procedure Laterality Date  ? ABDOMINAL HYSTERECTOMY  1971  ? BLADDER SURGERY  2010  ? Broken Pelvis  2015  ? C-EYE SURGERY PROCEDURE Bilateral   ? zaldivar  ? CATARACT EXTRACTION Bilateral   ? [place] in [place]  ? EYE SURGERY Bilateral   ? Cat Sx  ? HIP PINNING,CANNULATED Right 10/07/2013  ? Procedure: CANNULATED screws right hip;  Surgeon: Kathryne Hitch, MD;  Location: [facility] OR;  Service: Orthopedics;  Laterality: Right;  ? ?FAMILY HISTORY ?Family History  ?Problem Relation Age of Onset  ? Prostate cancer Father   ? Heart attack Sister   ? Cancer Brother   ? Cancer Sister   ? Heart attack Sister   ? Cancer Sister   ? Stroke Daughter   ?  Dementia Neg Hx   ? Alzheimer's disease Neg Hx   ? ?SOCIAL HISTORY ?Social History  ? ?Tobacco Use  ? Smoking status: Former  ?  Types: Cigarettes  ?  Quit date: 08/02/1970  ?  Years since quitting: 50.6  ? Smokeless tobacco: Never  ? Tobacco comments:  ?  "smoked very little"  ?Vaping Use  ? Vaping Use: Never used  ?Substance Use Topics  ? Alcohol use: Never  ?  Alcohol/week: 0.0 standard drinks  ? Drug use: Never  ?  ? ?  ?OPHTHALMIC EXAM: ? ?Base Eye Exam   ? ?  Visual Acuity (Snellen - Linear)   ? ?   Right Left  ? Dist [place] 20/25 20/40  ? Dist ph [place] unable unable  ?Pt states hands are too shaky today to do PH ? ?  ?  ? ? Tonometry (Tonopen, 2:42 PM)   ? ?   Right Left  ? Pressure 19 19  ? ?  ?  ? ? Pupils   ? ?   Dark Light Shape React APD  ? Right 3 2 Round Brisk None  ? Left 3 2 Round Brisk None  ? ?  ?  ? ? Visual Fields   ? ?   Left Right  ?  Full Full  ? ?  ?  ? ? Extraocular Movement   ? ?   Right Left  ?  Full Full  ? ?  ?  ? ? Neuro/Psych   ? ? Oriented x3: Yes  ? Mood/Affect: Normal  ? ?  ?  ? ? Dilation   ? ? Both eyes: 1.0% Mydriacyl, 2.5% Phenylephrine @ 2:42 PM  ? ?  ?  ? ?  ? ?Slit Lamp and Fundus Exam   ? ? Slit Lamp Exam   ? ?   Right Left  ? Lids/Lashes Dermatochalasis - upper lid Dermatochalasis - upper lid  ? Conjunctiva/Sclera White and quiet White and quiet  ? Cornea Arcus, 1-2+ Punctate epithelial erosions, mild EBMD and corneal haze Arcus, 2-3+ Punctate epithelial erosions, EBMD, irregular epi  ? Anterior Chamber Deep and quiet Deep and quiet  ? Iris Round and dilated Round and moderately dilated to 5mm  ? Lens PC IOL in good position with open PC, anterior capsular phimosis PC IOL in good position with open PC  ? Anterior Vitreous Vitreous syneresis, Posterior vitreous detachment Vitreous syneresis; PVD; vitreous condensations  ? ?  ?  ? ? Fundus Exam   ? ?   Right Left  ? Disc Mild temporal Pallor, Peripapillary atrophy, Sharp rim, Compact mild tilt, 2+ temporal Pallor, Peripapillary  atrophy, Compact, Sharp rim  ? C/D Ratio 0.1 0.0  ? Macula Flat, Blunted foveal reflex, mild Epiretinal membrane, drusen, RPE mottling and clumping, No heme or edema Flat, Blunted foveal reflex, RPE mottling and c

## 2021-03-26 ENCOUNTER — Other Ambulatory Visit: Payer: Self-pay

## 2021-03-26 ENCOUNTER — Ambulatory Visit (INDEPENDENT_AMBULATORY_CARE_PROVIDER_SITE_OTHER): Payer: Medicare Other | Admitting: Ophthalmology

## 2021-03-26 ENCOUNTER — Encounter (INDEPENDENT_AMBULATORY_CARE_PROVIDER_SITE_OTHER): Payer: Self-pay | Admitting: Ophthalmology

## 2021-03-26 DIAGNOSIS — H33101 Unspecified retinoschisis, right eye: Secondary | ICD-10-CM

## 2021-03-26 DIAGNOSIS — H353231 Exudative age-related macular degeneration, bilateral, with active choroidal neovascularization: Secondary | ICD-10-CM

## 2021-03-26 DIAGNOSIS — I10 Essential (primary) hypertension: Secondary | ICD-10-CM | POA: Diagnosis not present

## 2021-03-26 DIAGNOSIS — H3322 Serous retinal detachment, left eye: Secondary | ICD-10-CM

## 2021-03-26 DIAGNOSIS — Z961 Presence of intraocular lens: Secondary | ICD-10-CM

## 2021-03-26 DIAGNOSIS — H35373 Puckering of macula, bilateral: Secondary | ICD-10-CM | POA: Diagnosis not present

## 2021-03-26 DIAGNOSIS — H35033 Hypertensive retinopathy, bilateral: Secondary | ICD-10-CM | POA: Diagnosis not present

## 2021-03-26 DIAGNOSIS — H3563 Retinal hemorrhage, bilateral: Secondary | ICD-10-CM

## 2021-03-26 MED ORDER — AFLIBERCEPT 2MG/0.05ML IZ SOLN FOR KALEIDOSCOPE
2.0000 mg | INTRAVITREAL | Status: AC | PRN
  Administered 2021-03-26: 2 mg via INTRAVITREAL

## 2021-03-26 MED ORDER — BEVACIZUMAB CHEMO INJECTION 1.25MG/0.05ML SYRINGE FOR KALEIDOSCOPE
1.2500 mg | INTRAVITREAL | Status: AC | PRN
  Administered 2021-03-26: 1.25 mg via INTRAVITREAL

## 2021-03-26 MED ORDER — PROLENSA 0.07 % OP SOLN: 1.0000 [drp] | Freq: Four times a day (QID) | OPHTHALMIC | 3 refills | Status: DC

## 2021-05-04 NOTE — Progress Notes (Signed)
?[facility] - Clinic Note ? ?05/10/2021 ? ?  ? ?CHIEF COMPLAINT ?Patient presents for Retina Follow Up ? ?HISTORY OF PRESENT ILLNESS: ?Tracey Rosario is a 88 y.o. female who presents to the clinic today for:  ? ?HPI   ? ? Retina Follow Up   ?Patient presents with  Wet AMD.  In both eyes.  This started 6 weeks ago.  I, the attending physician,  performed the HPI with the patient and updated documentation appropriately. ? ?  ?  ? ? Comments   ?Patient here for 6 weeks retina follow up for exu ARMD OU. Patient states vision good. No difference. No eye pain.  ? ?  ?  ?Last edited by Rennis Chris, MD on 05/11/2021  4:28 PM.  ?  ? ? ?Pt states vision is about the same ? ?Referring physician: ?Courtney Paris, NP ?[address] ?[place],  [place] [address] ? ?HISTORICAL INFORMATION:  ? ?Selected notes from the MEDICAL RECORD NUMBER ?Referred by Dr. Baker Pierini for retinal eval  ? ?CURRENT MEDICATIONS: ?Current Outpatient Medications (Ophthalmic Drugs)  ?Medication Sig  ? Bromfenac Sodium (PROLENSA) 0.07 % SOLN Place 1 drop into the left eye 4 (four) times daily.  ? polyvinyl alcohol (LIQUIFILM TEARS) 1.4 % ophthalmic solution Place 1 drop into both eyes as needed for dry eyes.  ? prednisoLONE acetate (PRED FORTE) 1 % ophthalmic suspension Place 1 drop into the left eye in the morning and at bedtime.  ? prednisoLONE acetate (PRED FORTE) 1 % ophthalmic suspension Place 1 drop into the left eye 4 (four) times daily.  ? RESTASIS 0.05 % ophthalmic emulsion  (Patient not taking: Reported on 03/26/2021)  ? ?No current facility-administered medications for this visit. (Ophthalmic Drugs)  ? ?Current Outpatient Medications (Other)  ?Medication Sig  ? ALPRAZolam (XANAX) 0.25 MG tablet   ? amLODipine (NORVASC) 10 MG tablet Take 1 tablet by mouth daily.  ? amLODipine (NORVASC) 5 MG tablet Take 5 mg by mouth daily.  ? aspirin EC 81 MG tablet Take 1 tablet (81 mg total) by mouth daily.  ? Biotin 1000 MCG tablet  Take by mouth.  ? Cholecalciferol (VITAMIN D3) 125 MCG (5000 UT) TABS Take 5,000 Units by mouth daily.   ? citalopram (CELEXA) 20 MG tablet Take 20 mg by mouth daily.  ? COVID-19 mRNA vaccine, Pfizer, 30 MCG/0.3ML injection   ? donepezil (ARICEPT) 10 MG tablet Take 10 mg by mouth daily.  ? estradiol (ESTRACE) 0.1 MG/GM vaginal cream   ? ferrous gluconate (FERGON) 324 MG tablet Take 324 mg by mouth daily.  ? gabapentin (NEURONTIN) 100 MG capsule Take 200 mg by mouth 2 (two) times daily.  ? gabapentin (NEURONTIN) 300 MG capsule   ? levothyroxine (SYNTHROID) 50 MCG tablet Take 50 mcg by mouth daily before breakfast.  ? levothyroxine (SYNTHROID) 75 MCG tablet Take 75 mcg by mouth daily.  ? lisinopril (PRINIVIL,ZESTRIL) 30 MG tablet TAKE 1 TABLET DAILY (NEED AN APPOINTMENT)  ? lisinopril (ZESTRIL) 10 MG tablet Take 10 mg by mouth daily.  ? meloxicam (MOBIC) 7.5 MG tablet Take 7.5 mg by mouth daily.   ? methocarbamol (ROBAXIN) 500 MG tablet Take 1 tablet (500 mg total) by mouth 2 (two) times daily as needed.  ? Multiple Vitamins-Minerals (CENTRUM SILVER) tablet Take 1 tablet by mouth daily.  ? NONFORMULARY OR COMPOUNDED ITEM Peripheral Neuropathy Cream: ?Bupivacaine 1%, Doxepin 3%, Gabapentin 6%, Pentoxifylline 3%, Topiramate 1% ?Order faxed to [facility]  ?  nystatin (MYCOSTATIN) 100000 UNIT/ML suspension   ? omeprazole (PRILOSEC) 20 MG capsule TAKE 1 CAPSULE DAILY  ? oxybutynin (DITROPAN-XL) 5 MG 24 hr tablet Take 5 mg by mouth daily.  ? pravastatin (PRAVACHOL) 20 MG tablet   ? Probiotic Product (PROBIOTIC PO) Take 1 tablet by mouth daily.  ? cephALEXin (KEFLEX) 500 MG capsule  (Patient not taking: Reported on 03/26/2021)  ? predniSONE (STERAPRED UNI-PAK 21 TAB) 5 MG (21) TBPK tablet  (Patient not taking: Reported on 03/26/2021)  ? ?No current facility-administered medications for this visit. (Other)  ? ?REVIEW OF SYSTEMS: ?ROS   ?Positive for: Gastrointestinal, Neurological, Genitourinary, Musculoskeletal,  Cardiovascular, Eyes ?Negative for: Constitutional, Skin, HENT, Endocrine, Respiratory, Psychiatric, Allergic/Imm, Heme/Lymph ?Last edited by Laddie Aquaslarke, Rebecca S, COA on 05/10/2021  1:39 PM.  ?  ? ?ALLERGIES ?Allergies  ?Allergen Reactions  ? Codeine Other (See Comments)  ?  Makes her crazy  ? ?PAST MEDICAL HISTORY ?Past Medical History:  ?Diagnosis Date  ? Acid reflux   ? Anemia   ? Arthropathy   ? Bradycardia   ? beta blocker reduced in 2021 due to this  ? CKD (chronic kidney disease), stage III (HCC)   ? Cognitive decline   ? referred to neurologist  ? Depression 10/07/2013  ? daughter didn't know anything about this  ? Dizziness   ? Frequent PVCs   ? GERD (gastroesophageal reflux disease) 05/04/2013  ? Hip fx (HCC)   ? r hip, bilat pubis rami  ? Hyperlipemia   ? Hypertension   ? Hypertensive retinopathy   ? OU  ? Hypothyroidism   ? Insomnia   ? Macular degeneration   ? Exu OS  ? Multilevel degenerative disc disease   ? Palpitations 05/04/2013  ? saw cardiologist in 2015, he deleted A. Fib from the record as per note not documentation to support  ? Premature atrial contractions   ? PSVT (paroxysmal supraventricular tachycardia) (HCC)   ? a. short runs by monitor in 2021.  ? Short-term memory loss   ? Vitamin D deficiency   ? ?Past Surgical History:  ?Procedure Laterality Date  ? ABDOMINAL HYSTERECTOMY  1971  ? BLADDER SURGERY  2010  ? Broken Pelvis  2015  ? C-EYE SURGERY PROCEDURE Bilateral   ? zaldivar  ? CATARACT EXTRACTION Bilateral   ? [place] in [place]  ? EYE SURGERY Bilateral   ? Cat Sx  ? HIP PINNING,CANNULATED Right 10/07/2013  ? Procedure: CANNULATED screws right hip;  Surgeon: Kathryne Hitchhristopher Y Blackman, MD;  [facility];  Service: Orthopedics;  Laterality: Right;  ? ?FAMILY HISTORY ?Family History  ?Problem Relation Age of Onset  ? Prostate cancer Father   ? Heart attack Sister   ? Cancer Brother   ? Cancer Sister   ? Heart attack Sister   ? Cancer Sister   ? Stroke Daughter   ? Dementia Neg Hx   ? Alzheimer's  disease Neg Hx   ? ?SOCIAL HISTORY ?Social History  ? ?Tobacco Use  ? Smoking status: Former  ?  Types: Cigarettes  ?  Quit date: 08/02/1970  ?  Years since quitting: 50.8  ? Smokeless tobacco: Never  ? Tobacco comments:  ?  "smoked very little"  ?Vaping Use  ? Vaping Use: Never used  ?Substance Use Topics  ? Alcohol use: Never  ?  Alcohol/week: 0.0 standard drinks  ? Drug use: Never  ?  ? ?  ?OPHTHALMIC EXAM: ? ?Base Eye Exam   ? ? Visual Acuity (  Snellen - Linear)   ? ?   Right Left  ? Dist [place] 20/25 -2 20/30 -1  ? Dist ph [place] NI NI  ? ?  ?  ? ? Tonometry (Tonopen, 1:36 PM)   ? ?   Right Left  ? Pressure 12 10  ? ?  ?  ? ? Pupils   ? ?   Dark Light Shape React APD  ? Right 2 1 Round Brisk None  ? Left 2 1 Round Brisk None  ? ?  ?  ? ? Visual Fields (Counting fingers)   ? ?   Left Right  ?  Full Full  ? ?  ?  ? ? Extraocular Movement   ? ?   Right Left  ?  Full, Ortho Full, Ortho  ? ?  ?  ? ? Neuro/Psych   ? ? Oriented x3: Yes  ? Mood/Affect: Normal  ? ?  ?  ? ? Dilation   ? ? Both eyes: 1.0% Mydriacyl, 2.5% Phenylephrine @ 1:36 PM  ? ?  ?  ? ?  ? ?Slit Lamp and Fundus Exam   ? ? Slit Lamp Exam   ? ?   Right Left  ? Lids/Lashes Dermatochalasis - upper lid Dermatochalasis - upper lid  ? Conjunctiva/Sclera White and quiet White and quiet  ? Cornea Arcus, 1-2+ Punctate epithelial erosions, mild EBMD and corneal haze Arcus, 2-3+ Punctate epithelial erosions, EBMD, irregular epi  ? Anterior Chamber Deep and quiet Deep and quiet  ? Iris Round and dilated Round and moderately dilated to 23mm  ? Lens PC IOL in good position with open PC, anterior capsular phimosis PC IOL in good position with open PC  ? Anterior Vitreous Vitreous syneresis, Posterior vitreous detachment Vitreous syneresis; PVD; vitreous condensations  ? ?  ?  ? ? Fundus Exam   ? ?   Right Left  ? Disc Mild temporal Pallor, Peripapillary atrophy, Sharp rim, Compact mild tilt, 2+ temporal Pallor, Peripapillary atrophy, Compact, Sharp rim  ? C/D Ratio 0.1 0.0  ?  Macula Flat, Blunted foveal reflex, mild Epiretinal membrane, drusen, RPE mottling and clumping, No heme or edema Flat, Blunted foveal reflex, RPE mottling and clumping, Epiretinal membrane, stable improvement

## 2021-05-10 ENCOUNTER — Ambulatory Visit (INDEPENDENT_AMBULATORY_CARE_PROVIDER_SITE_OTHER): Payer: Medicare Other | Admitting: Ophthalmology

## 2021-05-10 ENCOUNTER — Encounter (INDEPENDENT_AMBULATORY_CARE_PROVIDER_SITE_OTHER): Payer: Self-pay | Admitting: Ophthalmology

## 2021-05-10 DIAGNOSIS — H353231 Exudative age-related macular degeneration, bilateral, with active choroidal neovascularization: Secondary | ICD-10-CM | POA: Diagnosis not present

## 2021-05-10 DIAGNOSIS — H3563 Retinal hemorrhage, bilateral: Secondary | ICD-10-CM | POA: Diagnosis not present

## 2021-05-10 DIAGNOSIS — I10 Essential (primary) hypertension: Secondary | ICD-10-CM

## 2021-05-10 DIAGNOSIS — H3322 Serous retinal detachment, left eye: Secondary | ICD-10-CM

## 2021-05-10 DIAGNOSIS — H35373 Puckering of macula, bilateral: Secondary | ICD-10-CM

## 2021-05-10 DIAGNOSIS — Z961 Presence of intraocular lens: Secondary | ICD-10-CM

## 2021-05-10 DIAGNOSIS — H35033 Hypertensive retinopathy, bilateral: Secondary | ICD-10-CM

## 2021-05-10 DIAGNOSIS — H33101 Unspecified retinoschisis, right eye: Secondary | ICD-10-CM

## 2021-05-11 ENCOUNTER — Encounter (INDEPENDENT_AMBULATORY_CARE_PROVIDER_SITE_OTHER): Payer: Self-pay | Admitting: Ophthalmology

## 2021-05-11 MED ORDER — AFLIBERCEPT 2MG/0.05ML IZ SOLN FOR KALEIDOSCOPE
2.0000 mg | INTRAVITREAL | Status: AC | PRN
  Administered 2021-05-11: 2 mg via INTRAVITREAL

## 2021-06-13 IMAGING — CT CT HEAD W/O CM
3 of 4 series · 15 of 47 positions shown, 18 images · non-contrast
Comparison: 04/22/2019

CLINICAL DATA: Headache, syncope

EXAM:
CT HEAD WITHOUT CONTRAST
TECHNIQUE: Contiguous axial images were obtained from the base of the skull
through the vertex without intravenous contrast.

[Series 4: head 2.0 h70h · axial · 0.44mm/px · z∈[-115,+19]mm · 9 of 85 slices shown, 12 images]
[im 9/85  brain]
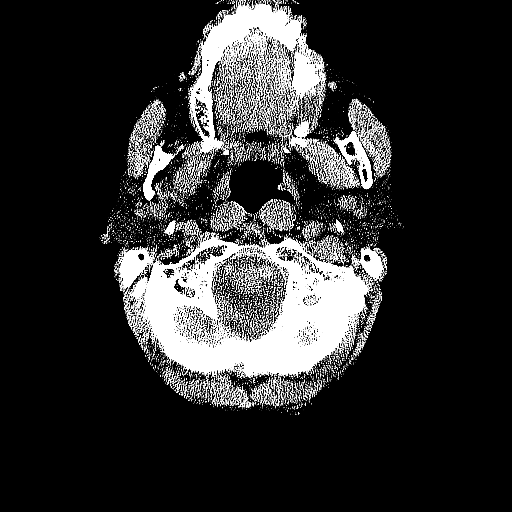
[im 9/85  bone]
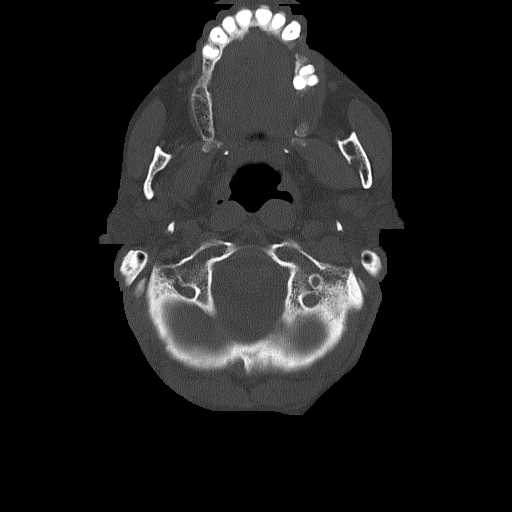
[im 17/85  brain]
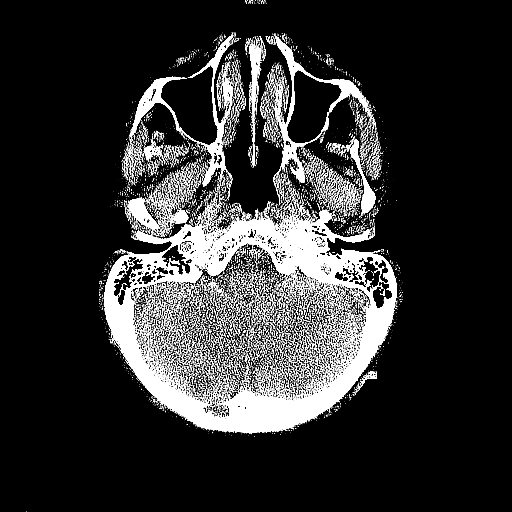
[im 26/85  brain]
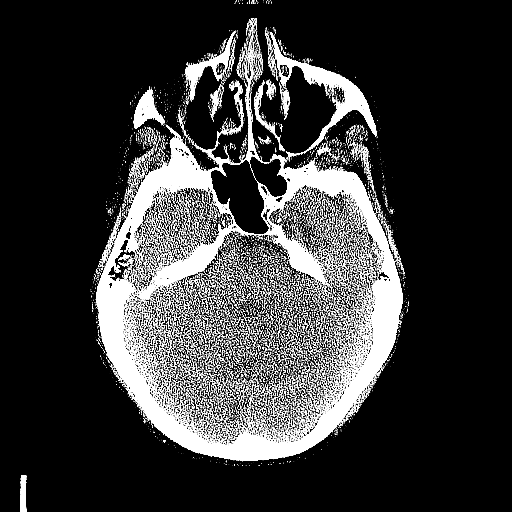
[im 34/85  brain]
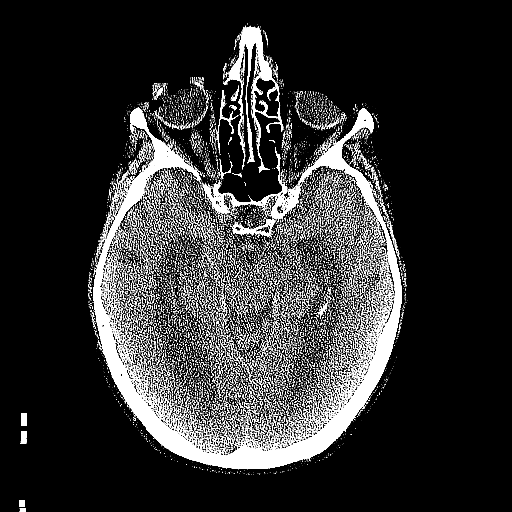
[im 43/85  brain]
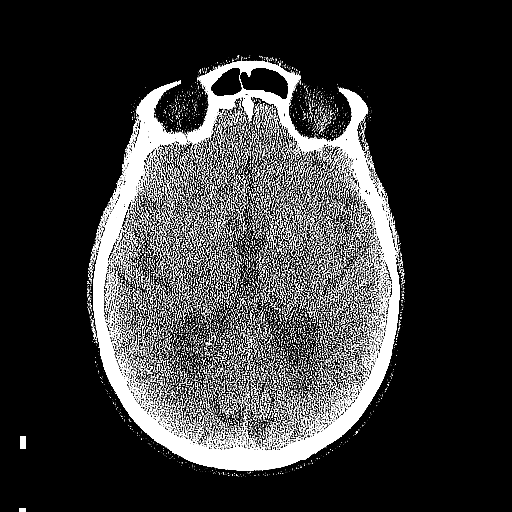
[im 43/85  bone]
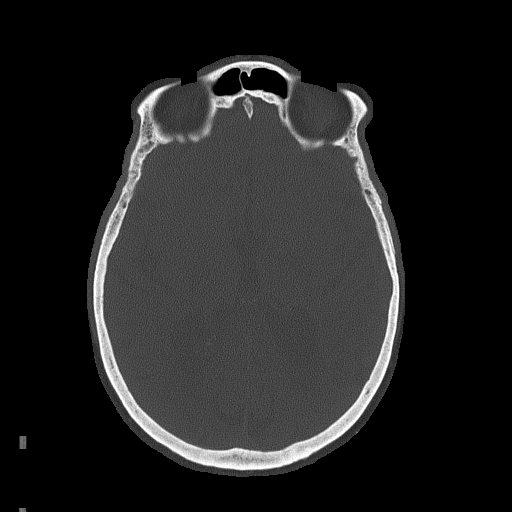
[im 51/85  brain]
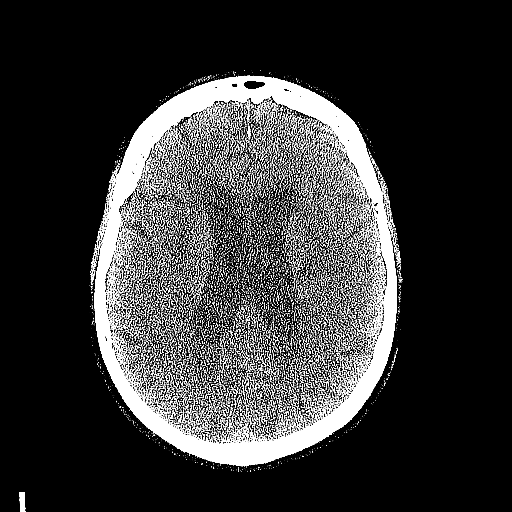
[im 59/85  brain]
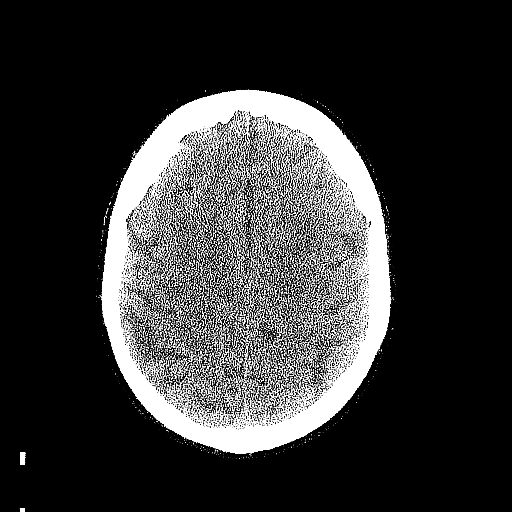
[im 68/85  brain]
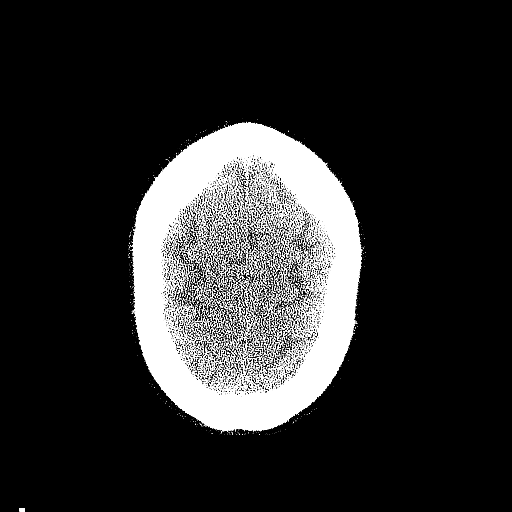
[im 76/85  brain]
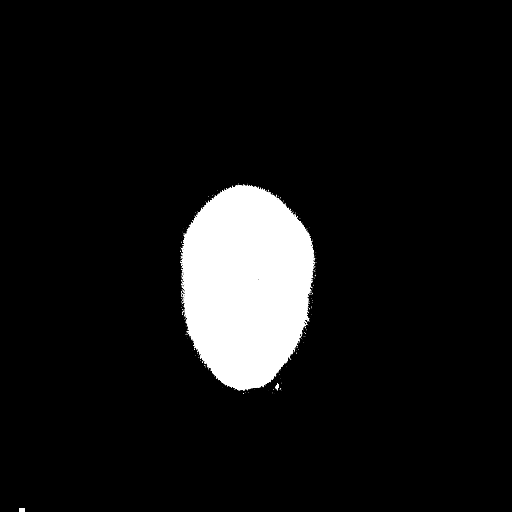
[im 76/85  bone]
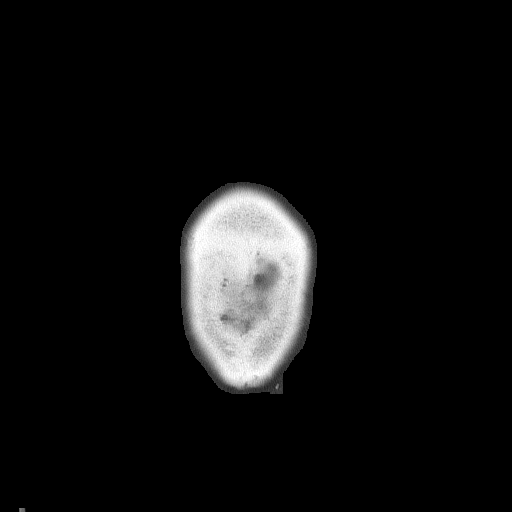

[Series 5: head 3.0 mpr cor · coronal · 0.32mm/px · 3 of 67 slices shown]
[im 26/67  brain]
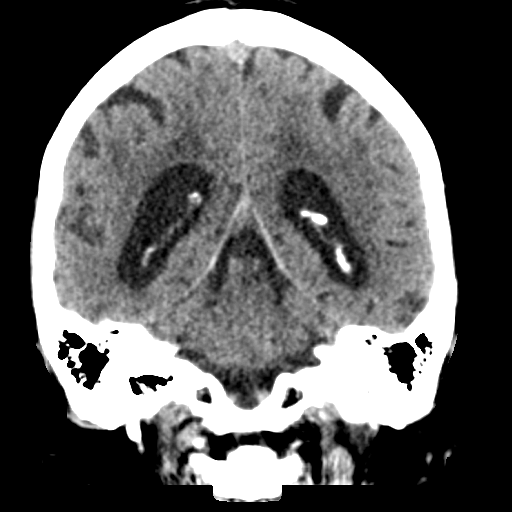
[im 31/67  brain]
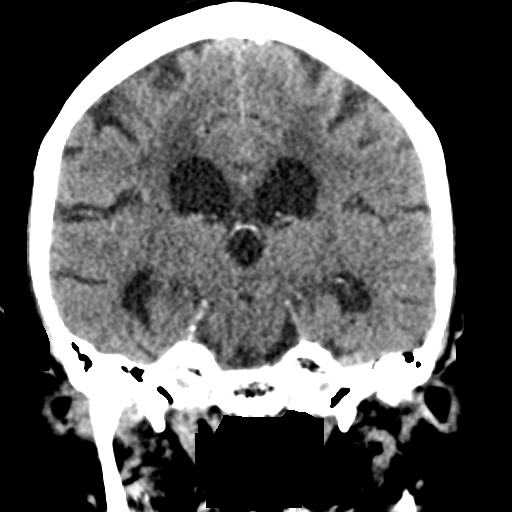
[im 36/67  brain]
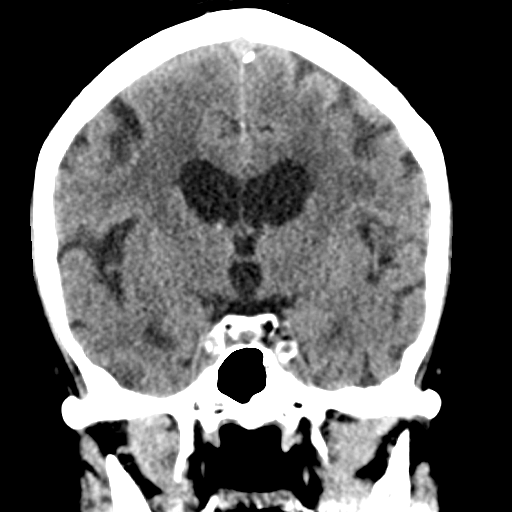

[Series 6: head 3.0 mpr sag · sagittal · 0.33mm/px · 3 of 49 slices shown]
[im 17/49  brain]
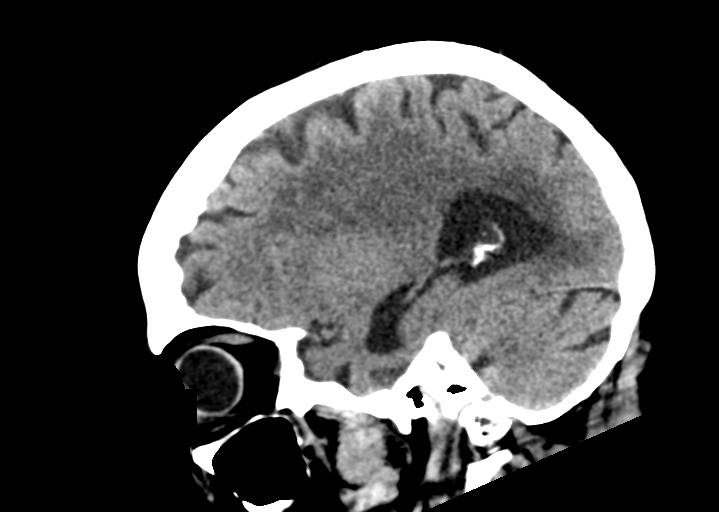
[im 25/49  brain]
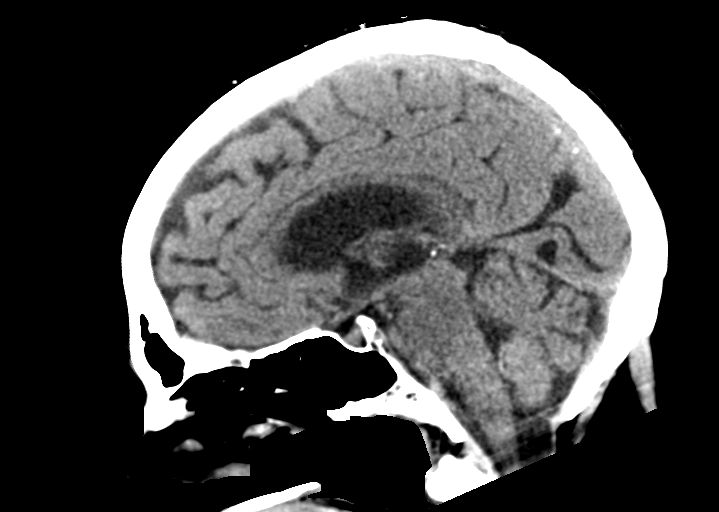
[im 33/49  brain]
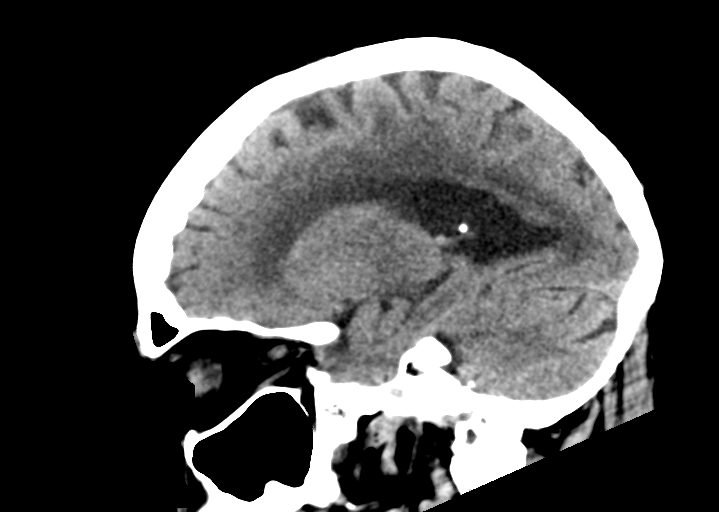

[15 of 47 positions shown; findings below may reference images not displayed]

FINDINGS: Brain: Stable chronic small-vessel ischemic changes throughout the
periventricular white matter. No signs of acute infarct or
hemorrhage. Lateral ventricles and midline structures are
unremarkable. No acute extra-axial fluid collections. No mass
effect.

Vascular: No hyperdense vessel or unexpected calcification.

Skull: Normal. Negative for fracture or focal lesion.

Sinuses/Orbits: No acute finding.

Other: Stable calcified subcutaneous lesion within the left
occipital scalp, benign in appearance.
IMPRESSION: 1. Stable chronic small vessel ischemic changes within the white
matter. No acute intracranial process.

## 2021-06-18 NOTE — Progress Notes (Signed)
[facility] - Clinic Note  06/23/2021     CHIEF COMPLAINT Patient presents for Retina Follow Up  HISTORY OF PRESENT ILLNESS: Tracey Rosario is a 88 y.o. female who presents to the clinic today for:   HPI     Retina Follow Up   Patient presents with  Wet AMD (IVE OD #1 & IVE OS #20 (05.08.23)).  In both eyes.  This started months ago.  Duration of 6 weeks.  Since onset it is stable.  I, the attending physician,  performed the HPI with the patient and updated documentation appropriately.        Comments   Patient states that the vision is still the same. She is using Prolensa and PF OS QID.  She states that she can see floaters but no flashes. Her blood pressure is under control with medication per patient.      Last edited by Rennis Chris, MD on 06/23/2021  4:52 PM.     Referring physician: Courtney Paris, NP [address] [place],  [place] [address]  HISTORICAL INFORMATION:   Selected notes from the MEDICAL RECORD NUMBER Referred by Dr. Baker Pierini for retinal eval   CURRENT MEDICATIONS: Current Outpatient Medications (Ophthalmic Drugs)  Medication Sig   polyvinyl alcohol (LIQUIFILM TEARS) 1.4 % ophthalmic solution Place 1 drop into both eyes as needed for dry eyes.   prednisoLONE acetate (PRED FORTE) 1 % ophthalmic suspension Place 1 drop into the left eye 4 (four) times daily.   Bromfenac Sodium (PROLENSA) 0.07 % SOLN Place 1 drop into the left eye 3 (three) times daily.   prednisoLONE acetate (PRED FORTE) 1 % ophthalmic suspension Place 1 drop into the left eye 3 (three) times daily.   RESTASIS 0.05 % ophthalmic emulsion  (Patient not taking: Reported on 03/26/2021)   No current facility-administered medications for this visit. (Ophthalmic Drugs)   Current Outpatient Medications (Other)  Medication Sig   ALPRAZolam (XANAX) 0.25 MG tablet    amLODipine (NORVASC) 10 MG tablet Take 1 tablet by mouth daily.   amLODipine (NORVASC) 5 MG  tablet Take 5 mg by mouth daily.   aspirin EC 81 MG tablet Take 1 tablet (81 mg total) by mouth daily.   Biotin 1000 MCG tablet Take by mouth.   Cholecalciferol (VITAMIN D3) 125 MCG (5000 UT) TABS Take 5,000 Units by mouth daily.    citalopram (CELEXA) 20 MG tablet Take 20 mg by mouth daily.   COVID-19 mRNA vaccine, Pfizer, 30 MCG/0.3ML injection    donepezil (ARICEPT) 10 MG tablet Take 10 mg by mouth daily.   estradiol (ESTRACE) 0.1 MG/GM vaginal cream    ferrous gluconate (FERGON) 324 MG tablet Take 324 mg by mouth daily.   gabapentin (NEURONTIN) 100 MG capsule Take 200 mg by mouth 2 (two) times daily.   gabapentin (NEURONTIN) 300 MG capsule    levothyroxine (SYNTHROID) 50 MCG tablet Take 50 mcg by mouth daily before breakfast.   levothyroxine (SYNTHROID) 75 MCG tablet Take 75 mcg by mouth daily.   lisinopril (PRINIVIL,ZESTRIL) 30 MG tablet TAKE 1 TABLET DAILY (NEED AN APPOINTMENT)   lisinopril (ZESTRIL) 10 MG tablet Take 10 mg by mouth daily.   meloxicam (MOBIC) 7.5 MG tablet Take 7.5 mg by mouth daily.    methocarbamol (ROBAXIN) 500 MG tablet Take 1 tablet (500 mg total) by mouth 2 (two) times daily as needed.   Multiple Vitamins-Minerals (CENTRUM SILVER) tablet Take 1 tablet by mouth daily.   NONFORMULARY  OR COMPOUNDED ITEM Peripheral Neuropathy Cream: Bupivacaine 1%, Doxepin 3%, Gabapentin 6%, Pentoxifylline 3%, Topiramate 1% Order faxed to [facility]   nystatin (MYCOSTATIN) 100000 UNIT/ML suspension    omeprazole (PRILOSEC) 20 MG capsule TAKE 1 CAPSULE DAILY   oxybutynin (DITROPAN-XL) 5 MG 24 hr tablet Take 5 mg by mouth daily.   pravastatin (PRAVACHOL) 20 MG tablet    Probiotic Product (PROBIOTIC PO) Take 1 tablet by mouth daily.   cephALEXin (KEFLEX) 500 MG capsule  (Patient not taking: Reported on 03/26/2021)   predniSONE (STERAPRED UNI-PAK 21 TAB) 5 MG (21) TBPK tablet  (Patient not taking: Reported on 03/26/2021)   No current facility-administered medications for this  visit. (Other)   REVIEW OF SYSTEMS: ROS   Positive for: Gastrointestinal, Neurological, Genitourinary, Musculoskeletal, Cardiovascular, Eyes Negative for: Constitutional, Skin, HENT, Endocrine, Respiratory, Psychiatric, Allergic/Imm, Heme/Lymph Last edited by Annie Paras, COT on 06/23/2021  1:31 PM.     ALLERGIES Allergies  Allergen Reactions   Codeine Other (See Comments)    Makes her crazy   PAST MEDICAL HISTORY Past Medical History:  Diagnosis Date   Acid reflux    Anemia    Arthropathy    Bradycardia    beta blocker reduced in 2021 due to this   CKD (chronic kidney disease), stage III ([place])    Cognitive decline    referred to neurologist   Depression 10/07/2013   daughter didn't know anything about this   Dizziness    Frequent PVCs    GERD (gastroesophageal reflux disease) 05/04/2013   Hip fx ([place])    r hip, bilat pubis rami   Hyperlipemia    Hypertension    Hypertensive retinopathy    OU   Hypothyroidism    Insomnia    Macular degeneration    Exu OS   Multilevel degenerative disc disease    Palpitations 05/04/2013   saw cardiologist in 2015, he deleted A. Fib from the record as per note not documentation to support   Premature atrial contractions    PSVT (paroxysmal supraventricular tachycardia) ([place])    a. short runs by monitor in 2021.   Short-term memory loss    Vitamin D deficiency    Past Surgical History:  Procedure Laterality Date   ABDOMINAL HYSTERECTOMY  1971   BLADDER SURGERY  2010   Broken Pelvis  2015   C-EYE SURGERY PROCEDURE Bilateral    [facility]   CATARACT EXTRACTION Bilateral    Done in [place] Bilateral    Cat Sx   HIP PINNING,CANNULATED Right 10/07/2013   Procedure: CANNULATED screws right hip;  Surgeon: Mcarthur Rossetti, MD;  Location: [facility];  Service: Orthopedics;  Laterality: Right;   FAMILY HISTORY Family History  Problem Relation Age of Onset   Prostate cancer Father    Heart attack Sister     Cancer Brother    Cancer Sister    Heart attack Sister    Cancer Sister    Stroke Daughter    Dementia Neg Hx    Alzheimer's disease Neg Hx    SOCIAL HISTORY Social History   Tobacco Use   Smoking status: Former    Types: Cigarettes    Quit date: 08/02/1970    Years since quitting: 50.9   Smokeless tobacco: Never   Tobacco comments:    "smoked very little"  Vaping Use   Vaping Use: Never used  Substance Use Topics   Alcohol use: Never    Alcohol/week: 0.0 standard drinks  of alcohol   Drug use: Never       OPHTHALMIC EXAM:  Base Eye Exam     Visual Acuity (Snellen - Linear)       Right Left   Dist [place] 20/25 +2 20/30 +2         Tonometry (Tonopen, 1:37 PM)       Right Left   Pressure 13 13         Pupils       Pupils Dark Light Shape React APD   Right PERRL 2 1 Round Brisk None   Left PERRL 2 1 Round Brisk None         Visual Fields       Left Right    Full Full         Extraocular Movement       Right Left    Full, Ortho Full, Ortho         Neuro/Psych     Oriented x3: Yes   Mood/Affect: Normal         Dilation     Both eyes: 1.0% Mydriacyl, 2.5% Phenylephrine @ 1:35 PM           Slit Lamp and Fundus Exam     Slit Lamp Exam       Right Left   Lids/Lashes Dermatochalasis - upper lid Dermatochalasis - upper lid   Conjunctiva/Sclera White and quiet White and quiet   Cornea Arcus, 1-2+ Punctate epithelial erosions, mild EBMD and corneal haze Arcus, 2-3+ Punctate epithelial erosions, EBMD, irregular epi   Anterior Chamber Deep and quiet Deep and quiet   Iris Round and dilated Round and moderately dilated to 46mm   Lens PC IOL in good position with open PC, anterior capsular phimosis PC IOL in good position with open PC   Anterior Vitreous Vitreous syneresis, Posterior vitreous detachment Vitreous syneresis; PVD; vitreous condensations         Fundus Exam       Right Left   Disc Mild temporal Pallor, Peripapillary  atrophy, Sharp rim, Compact mild tilt, 2+ temporal Pallor, Peripapillary atrophy, Compact, Sharp rim   C/D Ratio 0.1 0.0   Macula Flat, Blunted foveal reflex, mild Epiretinal membrane, drusen, RPE mottling and clumping, No heme or edema Flat, Blunted foveal reflex, RPE mottling and clumping, Epiretinal membrane, stable improvement central cystic changes, No heme   Vessels attenuated, mild tortuosity attenuated, mild tortuosity   Periphery IT schisis cavity from 0700-0830; otherwise attached; interval improvement in [facility] and exudation under schisis cavities (SRH turning white, exudation decreasing), focal pigmented CR scar at 0630; no red heme temporal sub-retinal hemorrhage improved - now white with surrounding pigment clumping;  No SRF -- completely resolved, focal, punctate IRH - improved, shallow schisis IT quad, No heme            IMAGING AND PROCEDURES  Imaging and Procedures for @TODAY @  OCT, Retina - OU - Both Eyes       Right Eye Quality was good. Central Foveal Thickness: 334. Progression has been stable. Findings include normal foveal contour, no IRF, no SRF, retinal drusen , epiretinal membrane, macular pucker, pigment epithelial detachment (stable ERM; bullous schisis with PEDs within IT quad caught on widefield -- persistent).   Left Eye Quality was good. Central Foveal Thickness: 311. Progression has been stable. Findings include no SRF, abnormal foveal contour, retinal drusen , epiretinal membrane, intraretinal fluid, preretinal fibrosis (stable improvement in central cystic changes/edema, persistent ERM with PRF).  Notes *Images captured and stored on drive  Diagnosis / Impression:  OD: stable ERM; bullous schisis with PEDs within IT quad caught on widefield -- persistent OS: stable improvement in central cystic changes/edema, persistent ERM with PRF  Clinical management:  See below  Abbreviations: NFP - Normal foveal profile. CME - cystoid macular edema. PED -  pigment epithelial detachment. IRF - intraretinal fluid. SRF - subretinal fluid. EZ - ellipsoid zone. ERM - epiretinal membrane. ORA - outer retinal atrophy. ORT - outer retinal tubulation. SRHM - subretinal hyper-reflective material      Intravitreal Injection, Pharmacologic Agent - OD - Right Eye       Time Out 06/23/2021. 2:35 PM. Confirmed correct patient, procedure, site, and patient consented.   Anesthesia Topical anesthesia was used. Anesthetic medications included Lidocaine 2%, Proparacaine 0.5%.   Procedure Preparation included 5% betadine to ocular surface, eyelid speculum. A (32g) needle was used.   Injection: 2 mg aflibercept 2 MG/0.05ML   Route: Intravitreal, Site: Right Eye   NDC: A3590391, Lot: [id number], Expiration date: 04/03/2022, Waste: 0 mL   Post-op Post injection exam found visual acuity of at least counting fingers. The patient tolerated the procedure well. There were no complications. The patient received written and verbal post procedure care education. Post injection medications were not given.      Intravitreal Injection, Pharmacologic Agent - OS - Left Eye       Time Out 06/23/2021. 2:36 PM. Confirmed correct patient, procedure, site, and patient consented.   Anesthesia Topical anesthesia was used. Anesthetic medications included Lidocaine 2%, Proparacaine 0.5%.   Procedure Preparation included 5% betadine to ocular surface, eyelid speculum. A (32g) needle was used.   Injection: 2 mg aflibercept 2 MG/0.05ML   Route: Intravitreal, Site: Left Eye   NDC: A3590391, Lot: [id number], Expiration date: 04/02/2022, Waste: 0 mL   Post-op Post injection exam found visual acuity of at least counting fingers. The patient tolerated the procedure well. There were no complications. The patient received written and verbal post procedure care education. Post injection medications were not given.      Color Fundus Photography Optos - OU - Both Eyes        Right Eye Progression has improved. Disc findings include normal observations. Macula : retinal pigment epithelium abnormalities. Vessels : tortuous vessels, attenuated. Periphery : exudates, hemorrhage, RPE abnormality (Peripheral subretinal hemes IT periphery -- improved, surrounding exudates improved).   Left Eye Progression has been stable. Disc findings include normal observations. Macula : normal observations. Vessels : attenuated. Periphery : hemorrhage, RPE abnormality (Stable improvement in sub retinal hemorrhage temporally (0200) -- SRH now white and pigment clumping surrounding).   Notes **Images stored on drive**  Impression: OD: IT schisis -- Peripheral subretinal hemes IT periphery -- improved, surrounding exudates improved -- PECHR OS: PEHCR with stably improved sub retinal hemorrhage temporally (0200)             ASSESSMENT/PLAN:    ICD-10-CM   1. Exudative age-related macular degeneration of both eyes with active choroidal neovascularization (HCC)  H35.3231 OCT, Retina - OU - Both Eyes    Intravitreal Injection, Pharmacologic Agent - OD - Right Eye    Intravitreal Injection, Pharmacologic Agent - OS - Left Eye    aflibercept (EYLEA) SOLN 2 mg    aflibercept (EYLEA) SOLN 2 mg    2. Subretinal hemorrhage of both eyes  H35.63 Color Fundus Photography Optos - OU - Both Eyes    3. Serous retinal detachment of left eye  H33.22     4. 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 Essential hypertension  I10     7. Hypertensive retinopathy of both eyes  H35.033     8. Pseudophakia of both eyes  Z96.1      1-3. Exudative age related macular degeneration / PECHR OU  - interval conversion to exu ARMD OD noted on 11.18.22 exam -- new SRH and exudates IT periphery  - initially presented with symptomatic floaters OS  - initial exam showed large peripheral temporal subretinal hemorrhage OS -- spanning 2-430 -- also mild  vitreous opacities and condensations that were causing symptomatic floaters  - B-scan (9.18.20) showed hyperreflective, ill-defined mass, 0300 periphery OS  - was seen by Dr. Daralene Milch, Ocular Oncologist, on 9.22.2020 -- no tumor on exam or b-scan  - s/p IVA OS #1 (08.05.20) for subretinal hemorrhage, peripheral CNV  - s/p IVA OD #1 (11.18.22), #2 (12.16.22), #3 (01.13.23), #4 (02.10.23), #5 (03.24.23)  - s/p IVE OS #1 (09.02.20) - sample, #2 (10.12.20), #3 (11.11.20), #4 (12.09.20), #5 (1.20.21), #6 (02.17.21), #7 (03.19.21), #8 (04.30.21), #9 (06.16.21), #10 (08.11.21), #11 (12.01.21), #12 (01.26.22), #13 (03.30.22), #14 (06.01.22), #15 (08.10.22), #16 (09.23.22), #17 (11.18.22), #18 (02.10.23), #19 (03.24.23), #20 (05.08.23)  - s/p IVE OD #1 (05.08.23)   - today BCVA 20/25 OD - stable; OS 20/30 - improved from 20/40  - exam shows improving peripheral SRH OD and stable improvement in central edema OS  - OCT shows OD: stable ERM; retinoschisis with PEDs within IT quad caught on widefield -- persistent; OS: stable improvement in central cystic changes/edema, persistent ERM with PRF at 6 wks  - continue Prolensa and PF to QID OS for possible CME component -- decrease to TID   - recommend IVE OD #2 and IVE OS #21 today, 06.21.23 w/ ext f/u to 8 wks  - pt wishes to proceed with injections  - RBA of procedure discussed, questions answered   - see procedure note  - IVE OU informed consent obtained and signed, 05.08.23  - [id number] Benefits investigation initiated 09.02.2020 -- approved for 2023  - f/u 8 weeks, sooner prn, OCT/DFE/OPTOS COLORS OU, possible injections  4. Epiretinal membrane, OU.  - mild ERM OU  - asymptomatic, no metamorphopsia  - no indication for surgery at this time  - monitor  5. Retinoschisis OD -- stable  - bullous schisis cavity, IT quadrant, from 0700-0830, -- ?less posterior extension  - no associated RT/RD but now with [facility] and exudates -- improving   - discussed findings  and prognosis  - monitor  6,7. Hypertensive retinopathy OU  - discussed importance of tight BP control  - monitor  8. Pseudophakia OU  - s/p CE/IOL OU  - beautiful surgery, doing well  - monitor  Ophthalmic Meds Ordered this visit:  Meds ordered this encounter  Medications   Bromfenac Sodium (PROLENSA) 0.07 % SOLN    Sig: Place 1 drop into the left eye 3 (three) times daily.    Dispense:  6 mL    Refill:  5   prednisoLONE acetate (PRED FORTE) 1 % ophthalmic suspension    Sig: Place 1 drop into the left eye 3 (three) times daily.    Dispense:  10 mL    Refill:  5   aflibercept (EYLEA) SOLN 2 mg   aflibercept (EYLEA) SOLN 2 mg     Return in about 8 weeks (  around 08/18/2021) for f/u exu ARMD OU, DFE, OCT, OPTOS COLORS OU.  There are no Patient Instructions on file for this visit.   Explained the diagnoses, plan, and follow up with the patient and they expressed understanding.  Patient expressed understanding of the importance of proper follow up care.  This document serves as a record of services personally performed by Karie Chimera, MD, PhD. It was created on their behalf by De Blanch, an ophthalmic technician. The creation of this record is the provider's dictation and/or activities during the visit.    Electronically signed by: De Blanch, OA, 06/23/21  4:56 PM  This document serves as a record of services personally performed by Karie Chimera, MD, PhD. It was created on their behalf by Glee Arvin. Manson Passey, OA an ophthalmic technician. The creation of this record is the provider's dictation and/or activities during the visit.    Electronically signed by: Glee Arvin. Manson Passey, [place] 06.21.2023 4:56 PM  Karie Chimera, M.D., Ph.D. Diseases & Surgery of the Retina and Vitreous [facility]  I have reviewed the above documentation for accuracy and completeness, and I agree with the above. Karie Chimera, M.D., Ph.D. 06/23/21 4:56  PM  Abbreviations: M myopia (nearsighted); A astigmatism; H hyperopia (farsighted); P presbyopia; Mrx spectacle prescription;  CTL contact lenses; OD right eye; OS left eye; OU both eyes  XT exotropia; ET esotropia; PEK punctate epithelial keratitis; PEE punctate epithelial erosions; DES dry eye syndrome; MGD meibomian gland dysfunction; ATs artificial tears; PFAT's preservative free artificial tears; NSC nuclear sclerotic cataract; PSC posterior subcapsular cataract; ERM epi-retinal membrane; PVD posterior vitreous detachment; RD retinal detachment; DM diabetes mellitus; DR diabetic retinopathy; NPDR non-proliferative diabetic retinopathy; PDR proliferative diabetic retinopathy; CSME clinically significant macular edema; DME diabetic macular edema; dbh dot blot hemorrhages; CWS cotton wool spot; POAG primary open angle glaucoma; C/D cup-to-disc ratio; HVF humphrey visual field; GVF goldmann visual field; OCT optical coherence tomography; IOP intraocular pressure; BRVO Branch retinal vein occlusion; CRVO central retinal vein occlusion; CRAO central retinal artery occlusion; BRAO branch retinal artery occlusion; RT retinal tear; SB scleral buckle; PPV pars plana vitrectomy; VH Vitreous hemorrhage; PRP panretinal laser photocoagulation; IVK intravitreal kenalog; VMT vitreomacular traction; MH Macular hole;  NVD neovascularization of the disc; NVE neovascularization elsewhere; AREDS age related eye disease study; ARMD age related macular degeneration; POAG primary open angle glaucoma; EBMD epithelial/anterior basement membrane dystrophy; ACIOL anterior chamber intraocular lens; IOL intraocular lens; PCIOL posterior chamber intraocular lens; Phaco/IOL phacoemulsification with intraocular lens placement; PRK photorefractive keratectomy; LASIK laser assisted in situ keratomileusis; HTN hypertension; DM diabetes mellitus; COPD chronic obstructive pulmonary disease

## 2021-06-23 ENCOUNTER — Encounter (INDEPENDENT_AMBULATORY_CARE_PROVIDER_SITE_OTHER): Payer: Self-pay | Admitting: Ophthalmology

## 2021-06-23 ENCOUNTER — Ambulatory Visit (INDEPENDENT_AMBULATORY_CARE_PROVIDER_SITE_OTHER): Payer: Medicare Other | Admitting: Ophthalmology

## 2021-06-23 DIAGNOSIS — I10 Essential (primary) hypertension: Secondary | ICD-10-CM | POA: Diagnosis not present

## 2021-06-23 DIAGNOSIS — H353231 Exudative age-related macular degeneration, bilateral, with active choroidal neovascularization: Secondary | ICD-10-CM | POA: Diagnosis not present

## 2021-06-23 DIAGNOSIS — H3563 Retinal hemorrhage, bilateral: Secondary | ICD-10-CM | POA: Diagnosis not present

## 2021-06-23 DIAGNOSIS — H3322 Serous retinal detachment, left eye: Secondary | ICD-10-CM

## 2021-06-23 DIAGNOSIS — H35373 Puckering of macula, bilateral: Secondary | ICD-10-CM

## 2021-06-23 DIAGNOSIS — H33101 Unspecified retinoschisis, right eye: Secondary | ICD-10-CM

## 2021-06-23 DIAGNOSIS — H35033 Hypertensive retinopathy, bilateral: Secondary | ICD-10-CM

## 2021-06-23 DIAGNOSIS — Z961 Presence of intraocular lens: Secondary | ICD-10-CM

## 2021-06-23 MED ORDER — PREDNISOLONE ACETATE 1 % OP SUSP
1.0000 [drp] | Freq: Three times a day (TID) | OPHTHALMIC | 5 refills | Status: DC
Start: 2021-06-23 — End: 2022-09-23

## 2021-06-23 MED ORDER — PROLENSA 0.07 % OP SOLN: 1.0000 [drp] | Freq: Three times a day (TID) | OPHTHALMIC | 5 refills | Status: DC

## 2021-06-23 MED ORDER — AFLIBERCEPT 2MG/0.05ML IZ SOLN FOR KALEIDOSCOPE
2.0000 mg | INTRAVITREAL | Status: AC | PRN
  Administered 2021-06-23: 2 mg via INTRAVITREAL

## 2021-08-10 NOTE — Progress Notes (Deleted)
[facility] - Clinic Note  08/18/2021     CHIEF COMPLAINT Patient presents for No chief complaint on file.  HISTORY OF PRESENT ILLNESS: Tracey Rosario is a 88 y.o. female who presents to the clinic today for:     Referring physician: Courtney Paris, NP [address] [place],  [place] [address]  HISTORICAL INFORMATION:   Selected notes from the MEDICAL RECORD NUMBER Referred by Dr. Baker Pierini for retinal eval   CURRENT MEDICATIONS: Current Outpatient Medications (Ophthalmic Drugs)  Medication Sig   Bromfenac Sodium (PROLENSA) 0.07 % SOLN Place 1 drop into the left eye 3 (three) times daily.   polyvinyl alcohol (LIQUIFILM TEARS) 1.4 % ophthalmic solution Place 1 drop into both eyes as needed for dry eyes.   prednisoLONE acetate (PRED FORTE) 1 % ophthalmic suspension Place 1 drop into the left eye 4 (four) times daily.   prednisoLONE acetate (PRED FORTE) 1 % ophthalmic suspension Place 1 drop into the left eye 3 (three) times daily.   RESTASIS 0.05 % ophthalmic emulsion  (Patient not taking: Reported on 03/26/2021)   No current facility-administered medications for this visit. (Ophthalmic Drugs)   Current Outpatient Medications (Other)  Medication Sig   ALPRAZolam (XANAX) 0.25 MG tablet    amLODipine (NORVASC) 10 MG tablet Take 1 tablet by mouth daily.   amLODipine (NORVASC) 5 MG tablet Take 5 mg by mouth daily.   aspirin EC 81 MG tablet Take 1 tablet (81 mg total) by mouth daily.   Biotin 1000 MCG tablet Take by mouth.   cephALEXin (KEFLEX) 500 MG capsule  (Patient not taking: Reported on 03/26/2021)   Cholecalciferol (VITAMIN D3) 125 MCG (5000 UT) TABS Take 5,000 Units by mouth daily.    citalopram (CELEXA) 20 MG tablet Take 20 mg by mouth daily.   COVID-19 mRNA vaccine, Pfizer, 30 MCG/0.3ML injection    donepezil (ARICEPT) 10 MG tablet Take 10 mg by mouth daily.   estradiol (ESTRACE) 0.1 MG/GM vaginal cream    ferrous gluconate (FERGON) 324  MG tablet Take 324 mg by mouth daily.   gabapentin (NEURONTIN) 100 MG capsule Take 200 mg by mouth 2 (two) times daily.   gabapentin (NEURONTIN) 300 MG capsule    levothyroxine (SYNTHROID) 50 MCG tablet Take 50 mcg by mouth daily before breakfast.   levothyroxine (SYNTHROID) 75 MCG tablet Take 75 mcg by mouth daily.   lisinopril (PRINIVIL,ZESTRIL) 30 MG tablet TAKE 1 TABLET DAILY (NEED AN APPOINTMENT)   lisinopril (ZESTRIL) 10 MG tablet Take 10 mg by mouth daily.   meloxicam (MOBIC) 7.5 MG tablet Take 7.5 mg by mouth daily.    methocarbamol (ROBAXIN) 500 MG tablet Take 1 tablet (500 mg total) by mouth 2 (two) times daily as needed.   Multiple Vitamins-Minerals (CENTRUM SILVER) tablet Take 1 tablet by mouth daily.   NONFORMULARY OR COMPOUNDED ITEM Peripheral Neuropathy Cream: Bupivacaine 1%, Doxepin 3%, Gabapentin 6%, Pentoxifylline 3%, Topiramate 1% Order faxed to [facility]   nystatin (MYCOSTATIN) 100000 UNIT/ML suspension    omeprazole (PRILOSEC) 20 MG capsule TAKE 1 CAPSULE DAILY   oxybutynin (DITROPAN-XL) 5 MG 24 hr tablet Take 5 mg by mouth daily.   pravastatin (PRAVACHOL) 20 MG tablet    predniSONE (STERAPRED UNI-PAK 21 TAB) 5 MG (21) TBPK tablet  (Patient not taking: Reported on 03/26/2021)   Probiotic Product (PROBIOTIC PO) Take 1 tablet by mouth daily.   No current facility-administered medications for this visit. (Other)   REVIEW OF SYSTEMS:  ALLERGIES Allergies  Allergen Reactions   Codeine Other (See Comments)    Makes her crazy   PAST MEDICAL HISTORY Past Medical History:  Diagnosis Date   Acid reflux    Anemia    Arthropathy    Bradycardia    beta blocker reduced in 2021 due to this   CKD (chronic kidney disease), stage III (HCC)    Cognitive decline    referred to neurologist   Depression 10/07/2013   daughter didn't know anything about this   Dizziness    Frequent PVCs    GERD (gastroesophageal reflux disease) 05/04/2013   Hip fx (HCC)    r hip,  bilat pubis rami   Hyperlipemia    Hypertension    Hypertensive retinopathy    OU   Hypothyroidism    Insomnia    Macular degeneration    Exu OS   Multilevel degenerative disc disease    Palpitations 05/04/2013   saw cardiologist in 2015, he deleted A. Fib from the record as per note not documentation to support   Premature atrial contractions    PSVT (paroxysmal supraventricular tachycardia) (HCC)    a. short runs by monitor in 2021.   Short-term memory loss    Vitamin D deficiency    Past Surgical History:  Procedure Laterality Date   ABDOMINAL HYSTERECTOMY  1971   BLADDER SURGERY  2010   Broken Pelvis  2015   C-EYE SURGERY PROCEDURE Bilateral    zaldivar   CATARACT EXTRACTION Bilateral    Done in [place]   EYE SURGERY Bilateral    Cat Sx   HIP PINNING,CANNULATED Right 10/07/2013   Procedure: CANNULATED screws right hip;  Surgeon: Kathryne Hitch, MD;  [facility];  Service: Orthopedics;  Laterality: Right;   FAMILY HISTORY Family History  Problem Relation Age of Onset   Prostate cancer Father    Heart attack Sister    Cancer Brother    Cancer Sister    Heart attack Sister    Cancer Sister    Stroke Daughter    Dementia Neg Hx    Alzheimer's disease Neg Hx    SOCIAL HISTORY Social History   Tobacco Use   Smoking status: Former    Types: Cigarettes    Quit date: 08/02/1970    Years since quitting: 51.0   Smokeless tobacco: Never   Tobacco comments:    "smoked very little"  Vaping Use   Vaping Use: Never used  Substance Use Topics   Alcohol use: Never    Alcohol/week: 0.0 standard drinks of alcohol   Drug use: Never       OPHTHALMIC EXAM:  Not recorded     IMAGING AND PROCEDURES  Imaging and Procedures for @TODAY @          ASSESSMENT/PLAN:  No diagnosis found.  1-3. 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 05-04-1979, Ocular Oncologist, on 9.22.2020 -- no tumor on exam or b-scan  - s/p IVA OS #1 (08.05.20) for subretinal hemorrhage, peripheral CNV  - s/p IVA OD #1 (11.18.22), #2 (12.16.22), #3 (01.13.23), #  4 (02.10.23), #5 (03.24.23)  - s/p IVE OS #1 (09.02.20) - sample, #2 (10.12.20), #3 (11.11.20), #4 (12.09.20), #5 (1.20.21), #6 (02.17.21), #7 (03.19.21), #8 (04.30.21), #9 (06.16.21), #10 (08.11.21), #11 (12.01.21), #12 (01.26.22), #13 (03.30.22), #14 (06.01.22), #15 (08.10.22), #16 (09.23.22), #17 (11.18.22), #18 (02.10.23), #19 (03.24.23), #20 (05.08.23), #21 (06.21.23)  - s/p IVE OD #1 (05.08.23), #2 (06.21.23)  - today BCVA 20/25 OD - stable; OS 20/30 - improved from 20/40  - exam shows improving peripheral SRH OD and stable improvement in central edema OS  - OCT shows OD: stable ERM; retinoschisis with PEDs within IT quad caught on widefield -- persistent; OS: stable improvement in central cystic changes/edema, persistent ERM with PRF at 6 wks  - continue Prolensa and PF to QID OS for possible CME component -- decrease to TID   - recommend IVE OD #3 and IVE OS #22 today, 08.16.23 w/ ext f/u to 8 wks  - pt wishes to proceed with injections  - RBA of procedure discussed, questions answered   - see procedure note  - IVE OU informed consent obtained and signed, 05.08.23  - [id number] investigation initiated 09.02.2020 -- approved for 2023  - f/u 8 weeks, sooner prn, OCT/DFE/OPTOS COLORS OU, possible injections  4. Epiretinal membrane, OU.  - mild ERM OU  - asymptomatic, no metamorphopsia  - no indication for surgery at this time  - monitor  5. Retinoschisis OD --  stable  - bullous schisis cavity, IT quadrant, from 0700-0830, -- ?less posterior extension  - no associated RT/RD but now with [facility] and exudates -- improving   - discussed findings and prognosis  - monitor  6,7. Hypertensive retinopathy OU  - discussed importance of tight BP control  - monitor  8. Pseudophakia OU  - s/p CE/IOL OU  - beautiful surgery, doing well  - monitor  Ophthalmic Meds Ordered this visit:  No orders of the defined types were placed in this encounter.    No follow-ups on file.  There are no Patient Instructions on file for this visit.   Explained the diagnoses, plan, and follow up with the patient and they expressed understanding.  Patient expressed understanding of the importance of proper follow up care.  This document serves as a record of services personally performed by Karie Chimera, MD, PhD. It was created on their behalf by De Blanch, an ophthalmic technician. The creation of this record is the provider's dictation and/or activities during the visit.    Electronically signed by: De Blanch, OA, 08/10/21  10:07 AM   Karie Chimera, M.D., Ph.D. Diseases & Surgery of the Retina and Vitreous [facility]   Abbreviations: M myopia (nearsighted); A astigmatism; H hyperopia (farsighted); P presbyopia; Mrx spectacle prescription;  CTL contact lenses; OD right eye; OS left eye; OU both eyes  XT exotropia; ET esotropia; PEK punctate epithelial keratitis; PEE punctate epithelial erosions; DES dry eye syndrome; MGD meibomian gland dysfunction; ATs artificial tears; PFAT's preservative free artificial tears; NSC nuclear sclerotic cataract; PSC posterior subcapsular cataract; ERM epi-retinal membrane; PVD posterior vitreous detachment; RD retinal detachment; DM diabetes mellitus; DR diabetic retinopathy; NPDR non-proliferative diabetic retinopathy; PDR proliferative diabetic retinopathy; CSME clinically significant macular  edema; DME diabetic macular edema; dbh dot blot hemorrhages; CWS cotton wool spot; POAG primary open angle glaucoma; C/D cup-to-disc ratio; HVF humphrey visual field; GVF goldmann visual field; OCT optical coherence tomography; IOP intraocular pressure; BRVO Branch retinal vein occlusion; CRVO central retinal vein occlusion;  CRAO central retinal artery occlusion; BRAO branch retinal artery occlusion; RT retinal tear; SB scleral buckle; PPV pars plana vitrectomy; VH Vitreous hemorrhage; PRP panretinal laser photocoagulation; IVK intravitreal kenalog; VMT vitreomacular traction; MH Macular hole;  NVD neovascularization of the disc; NVE neovascularization elsewhere; AREDS age related eye disease study; ARMD age related macular degeneration; POAG primary open angle glaucoma; EBMD epithelial/anterior basement membrane dystrophy; ACIOL anterior chamber intraocular lens; IOL intraocular lens; PCIOL posterior chamber intraocular lens; Phaco/IOL phacoemulsification with intraocular lens placement; [place] photorefractive keratectomy; LASIK laser assisted in situ keratomileusis; HTN hypertension; DM diabetes mellitus; COPD chronic obstructive pulmonary disease

## 2021-08-18 ENCOUNTER — Encounter (INDEPENDENT_AMBULATORY_CARE_PROVIDER_SITE_OTHER): Payer: Self-pay | Admitting: Ophthalmology

## 2021-08-18 ENCOUNTER — Ambulatory Visit (INDEPENDENT_AMBULATORY_CARE_PROVIDER_SITE_OTHER): Payer: Medicare Other | Admitting: Ophthalmology

## 2021-08-18 DIAGNOSIS — H35033 Hypertensive retinopathy, bilateral: Secondary | ICD-10-CM

## 2021-08-18 DIAGNOSIS — H3322 Serous retinal detachment, left eye: Secondary | ICD-10-CM

## 2021-08-18 DIAGNOSIS — Z961 Presence of intraocular lens: Secondary | ICD-10-CM

## 2021-08-18 DIAGNOSIS — H3563 Retinal hemorrhage, bilateral: Secondary | ICD-10-CM

## 2021-08-18 DIAGNOSIS — H353231 Exudative age-related macular degeneration, bilateral, with active choroidal neovascularization: Secondary | ICD-10-CM

## 2021-08-18 DIAGNOSIS — H35373 Puckering of macula, bilateral: Secondary | ICD-10-CM | POA: Diagnosis not present

## 2021-08-18 DIAGNOSIS — H33101 Unspecified retinoschisis, right eye: Secondary | ICD-10-CM

## 2021-08-18 DIAGNOSIS — I10 Essential (primary) hypertension: Secondary | ICD-10-CM | POA: Diagnosis not present

## 2021-08-18 MED ORDER — AFLIBERCEPT 2MG/0.05ML IZ SOLN FOR KALEIDOSCOPE
2.0000 mg | INTRAVITREAL | Status: AC | PRN
  Administered 2021-08-18: 2 mg via INTRAVITREAL

## 2021-08-18 NOTE — Progress Notes (Signed)
[facility] - Clinic Note  08/18/2021     CHIEF COMPLAINT Patient presents for Retina Follow Up  HISTORY OF PRESENT ILLNESS: Tracey Rosario is a 88 y.o. female who presents to the clinic today for:   HPI     Retina Follow Up   Patient presents with  Wet AMD.  In both eyes.  This started 8 weeks ago.  I, the attending physician,  performed the HPI with the patient and updated documentation appropriately.        Comments   Patient here for 8 weeks retina follow up for exu ARMD OU. Patient states vision doing alright. No eye pain.       Last edited by Rennis ChrisZamora, Sianne Tejada, MD on 08/18/2021  4:43 PM.      Referring physician: Courtney ParisMcCoy, Rachel, NP [address] [place] [id number]  HISTORICAL INFORMATION:   Selected notes from the MEDICAL RECORD NUMBER Referred by Dr. Baker Pierinihris Weaver for retinal eval   CURRENT MEDICATIONS: Current Outpatient Medications (Ophthalmic Drugs)  Medication Sig   Bromfenac Sodium (PROLENSA) 0.07 % SOLN Place 1 drop into the left eye 3 (three) times daily.   polyvinyl alcohol (LIQUIFILM TEARS) 1.4 % ophthalmic solution Place 1 drop into both eyes as needed for dry eyes.   prednisoLONE acetate (PRED FORTE) 1 % ophthalmic suspension Place 1 drop into the left eye 4 (four) times daily.   prednisoLONE acetate (PRED FORTE) 1 % ophthalmic suspension Place 1 drop into the left eye 3 (three) times daily.   RESTASIS 0.05 % ophthalmic emulsion  (Patient not taking: Reported on 03/26/2021)   No current facility-administered medications for this visit. (Ophthalmic Drugs)   Current Outpatient Medications (Other)  Medication Sig   ALPRAZolam (XANAX) 0.25 MG tablet    amLODipine (NORVASC) 10 MG tablet Take 1 tablet by mouth daily.   amLODipine (NORVASC) 5 MG tablet Take 5 mg by mouth daily.   aspirin EC 81 MG tablet Take 1 tablet (81 mg total) by mouth daily.   Biotin 1000 MCG tablet Take by mouth.   Cholecalciferol (VITAMIN D3) 125  MCG (5000 UT) TABS Take 5,000 Units by mouth daily.    citalopram (CELEXA) 20 MG tablet Take 20 mg by mouth daily.   COVID-19 mRNA vaccine, Pfizer, 30 MCG/0.3ML injection    donepezil (ARICEPT) 10 MG tablet Take 10 mg by mouth daily.   estradiol (ESTRACE) 0.1 MG/GM vaginal cream    ferrous gluconate (FERGON) 324 MG tablet Take 324 mg by mouth daily.   gabapentin (NEURONTIN) 100 MG capsule Take 200 mg by mouth 2 (two) times daily.   gabapentin (NEURONTIN) 300 MG capsule    levothyroxine (SYNTHROID) 50 MCG tablet Take 50 mcg by mouth daily before breakfast.   levothyroxine (SYNTHROID) 75 MCG tablet Take 75 mcg by mouth daily.   lisinopril (PRINIVIL,ZESTRIL) 30 MG tablet TAKE 1 TABLET DAILY (NEED AN APPOINTMENT)   lisinopril (ZESTRIL) 10 MG tablet Take 10 mg by mouth daily.   meloxicam (MOBIC) 7.5 MG tablet Take 7.5 mg by mouth daily.    methocarbamol (ROBAXIN) 500 MG tablet Take 1 tablet (500 mg total) by mouth 2 (two) times daily as needed.   Multiple Vitamins-Minerals (CENTRUM SILVER) tablet Take 1 tablet by mouth daily.   NONFORMULARY OR COMPOUNDED ITEM Peripheral Neuropathy Cream: Bupivacaine 1%, Doxepin 3%, Gabapentin 6%, Pentoxifylline 3%, Topiramate 1% Order faxed to [facility]   nystatin (MYCOSTATIN) 100000 UNIT/ML suspension    omeprazole (PRILOSEC) 20  MG capsule TAKE 1 CAPSULE DAILY   oxybutynin (DITROPAN-XL) 5 MG 24 hr tablet Take 5 mg by mouth daily.   pravastatin (PRAVACHOL) 20 MG tablet    Probiotic Product (PROBIOTIC PO) Take 1 tablet by mouth daily.   cephALEXin (KEFLEX) 500 MG capsule  (Patient not taking: Reported on 03/26/2021)   predniSONE (STERAPRED UNI-PAK 21 TAB) 5 MG (21) TBPK tablet  (Patient not taking: Reported on 03/26/2021)   No current facility-administered medications for this visit. (Other)   REVIEW OF SYSTEMS: ROS   Positive for: Gastrointestinal, Neurological, Genitourinary, Musculoskeletal, Cardiovascular, Eyes Negative for: Constitutional, Skin,  HENT, Endocrine, Respiratory, Psychiatric, Allergic/Imm, Heme/Lymph Last edited by Laddie Aquas, COA on 08/18/2021  1:29 PM.      ALLERGIES Allergies  Allergen Reactions   Codeine Other (See Comments)    Makes her crazy   PAST MEDICAL HISTORY Past Medical History:  Diagnosis Date   Acid reflux    Anemia    Arthropathy    Bradycardia    beta blocker reduced in 2021 due to this   CKD (chronic kidney disease), stage III (HCC)    Cognitive decline    referred to neurologist   Depression 10/07/2013   daughter didn't know anything about this   Dizziness    Frequent PVCs    GERD (gastroesophageal reflux disease) 05/04/2013   Hip fx (HCC)    r hip, bilat pubis rami   Hyperlipemia    Hypertension    Hypertensive retinopathy    OU   Hypothyroidism    Insomnia    Macular degeneration    Exu OS   Multilevel degenerative disc disease    Palpitations 05/04/2013   saw cardiologist in 2015, he deleted A. Fib from the record as per note not documentation to support   Premature atrial contractions    PSVT (paroxysmal supraventricular tachycardia) (HCC)    a. short runs by monitor in 2021.   Short-term memory loss    Vitamin D deficiency    Past Surgical History:  Procedure Laterality Date   ABDOMINAL HYSTERECTOMY  1971   BLADDER SURGERY  2010   Broken Pelvis  2015   C-EYE SURGERY PROCEDURE Bilateral    zaldivar   CATARACT EXTRACTION Bilateral    [place] in [place]   EYE SURGERY Bilateral    Cat Sx   HIP PINNING,CANNULATED Right 10/07/2013   Procedure: CANNULATED screws right hip;  Surgeon: Kathryne Hitch, MD;  Location: [facility] OR;  Service: Orthopedics;  Laterality: Right;   FAMILY HISTORY Family History  Problem Relation Age of Onset   Prostate cancer Father    Heart attack Sister    Cancer Brother    Cancer Sister    Heart attack Sister    Cancer Sister    Stroke Daughter    Dementia Neg Hx    Alzheimer's disease Neg Hx    SOCIAL HISTORY Social History    Tobacco Use   Smoking status: Former    Types: Cigarettes    Quit date: 08/02/1970    Years since quitting: 51.0   Smokeless tobacco: Never   Tobacco comments:    "smoked very little"  Vaping Use   Vaping Use: Never used  Substance Use Topics   Alcohol use: Never    Alcohol/week: 0.0 standard drinks of alcohol   Drug use: Never       OPHTHALMIC EXAM:  Base Eye Exam     Visual Acuity (Snellen - Linear)  Right Left   Dist [place] 20/25 -2 20/30 -2   Dist ph [place] NI NI         Tonometry (Tonopen, 1:27 PM)       Right Left   Pressure 09 11         Pupils       Dark Light Shape React APD   Right 2 1 Round Brisk None   Left 2 1 Round Brisk None         Visual Fields (Counting fingers)       Left Right    Full Full         Extraocular Movement       Right Left    Full, Ortho Full, Ortho         Neuro/Psych     Oriented x3: Yes   Mood/Affect: Normal         Dilation     Both eyes: 1.0% Mydriacyl, 2.5% Phenylephrine @ 1:27 PM           Slit Lamp and Fundus Exam     Slit Lamp Exam       Right Left   Lids/Lashes Dermatochalasis - upper lid Dermatochalasis - upper lid   Conjunctiva/Sclera White and quiet White and quiet   Cornea Arcus, 1-2+ Punctate epithelial erosions, mild EBMD and corneal haze Arcus, 2-3+ Punctate epithelial erosions, EBMD, irregular epi   Anterior Chamber Deep and quiet Deep and quiet   Iris Round and dilated Round and moderately dilated to 67mm   Lens PC IOL in good position with open PC, anterior capsular phimosis PC IOL in good position with open PC   Anterior Vitreous Vitreous syneresis, Posterior vitreous detachment Vitreous syneresis; PVD; vitreous condensations         Fundus Exam       Right Left   Disc Mild temporal Pallor, Peripapillary atrophy, Sharp rim, Compact mild tilt, 2+ temporal Pallor, Peripapillary atrophy, Compact, Sharp rim   C/D Ratio 0.1 0.0   Macula Flat, Blunted foveal reflex, mild  Epiretinal membrane, drusen, RPE mottling and clumping, No heme or edema Flat, Blunted foveal reflex, RPE mottling and clumping, Epiretinal membrane, stable improvement central cystic changes, No heme   Vessels attenuated, Tortuous attenuated, mild tortuosity   Periphery IT schisis cavity from 0700-0830; otherwise attached; interval improvement in [facility] and exudation under schisis cavities (SRH turning white, exudation decreasing), focal pigmented CR scar at 0630; no red heme temporal sub-retinal hemorrhage improved - now white with surrounding pigment clumping;  No SRF -- completely resolved, focal, punctate IRH - improved, shallow schisis IT quad, No heme            IMAGING AND PROCEDURES  Imaging and Procedures for @TODAY @  OCT, Retina - OU - Both Eyes       Right Eye Quality was good. Central Foveal Thickness: 350. Progression has been stable. Findings include no IRF, no SRF, abnormal foveal contour, retinal drusen , epiretinal membrane, macular pucker, pigment epithelial detachment (stable ERM with interval blunting of foveal contour, bullous schisis with PEDs within IT quad caught on widefield -- persistent).   Left Eye Quality was good. Central Foveal Thickness: 311. Progression has been stable. Findings include no IRF, no SRF, abnormal foveal contour, retinal drusen , epiretinal membrane, preretinal fibrosis (stable improvement in central cystic changes/edema, persistent ERM with PRF).   Notes *Images captured and stored on drive  Diagnosis / Impression:  OD: stable ERM with interval blunting of foveal contour;  bullous schisis with PEDs within, IT quad, caught on widefield -- persistent OS: stable improvement in central cystic changes/edema, persistent ERM with PRF  Clinical management:  See below  Abbreviations: NFP - Normal foveal profile. CME - cystoid macular edema. PED - pigment epithelial detachment. IRF - intraretinal fluid. SRF - subretinal fluid. EZ - ellipsoid zone.  ERM - epiretinal membrane. ORA - outer retinal atrophy. ORT - outer retinal tubulation. SRHM - subretinal hyper-reflective material      Color Fundus Photography Optos - OU - Both Eyes       Right Eye Progression has improved. Disc findings include normal observations. Macula : retinal pigment epithelium abnormalities. Vessels : tortuous vessels, attenuated. Periphery : exudates, hemorrhage, RPE abnormality (Peripheral subretinal hemes IT periphery -- improved, surrounding exudates improved).   Left Eye Progression has been stable. Disc findings include normal observations. Macula : normal observations. Vessels : attenuated. Periphery : hemorrhage, RPE abnormality (Stable improvement in sub retinal hemorrhage temporally (0200) -- SRH now white and pigment clumping surrounding).   Notes **Images stored on drive**  Impression: OD: IT schisis -- Peripheral subretinal hemes IT periphery -- improved, surrounding exudates improved -- PECHR OS: PEHCR with stably improved sub retinal hemorrhage temporally (0200)       Intravitreal Injection, Pharmacologic Agent - OD - Right Eye       Time Out 08/18/2021. 3:13 PM. Confirmed correct patient, procedure, site, and patient consented.   Anesthesia Topical anesthesia was used. Anesthetic medications included Lidocaine 2%, Proparacaine 0.5%.   Procedure Preparation included 5% betadine to ocular surface, eyelid speculum. A (32g) needle was used.   Injection: 2 mg aflibercept 2 MG/0.05ML   Route: Intravitreal, Site: Right Eye   NDC: L6038910, Lot: [id number], Expiration date: 05/03/2022, Waste: 0 mL   Post-op Post injection exam found visual acuity of at least counting fingers. The patient tolerated the procedure well. There were no complications. The patient received written and verbal post procedure care education. Post injection medications were not given.      Intravitreal Injection, Pharmacologic Agent - OS - Left Eye        Time Out 08/18/2021. 3:13 PM. Confirmed correct patient, procedure, site, and patient consented.   Anesthesia Topical anesthesia was used. Anesthetic medications included Lidocaine 2%, Proparacaine 0.5%.   Procedure Preparation included 5% betadine to ocular surface, eyelid speculum. A (32g) needle was used.   Injection: 2 mg aflibercept 2 MG/0.05ML   Route: Intravitreal, Site: Left Eye   NDC: L6038910, Lot: [id number], Expiration date: 05/03/2022, Waste: 0 mL   Post-op Post injection exam found visual acuity of at least counting fingers. The patient tolerated the procedure well. There were no complications. The patient received written and verbal post procedure care education. Post injection medications were not given.            ASSESSMENT/PLAN:    ICD-10-CM   1. Exudative age-related macular degeneration of both eyes with active choroidal neovascularization (HCC)  H35.3231 OCT, Retina - OU - Both Eyes    Intravitreal Injection, Pharmacologic Agent - OD - Right Eye    Intravitreal Injection, Pharmacologic Agent - OS - Left Eye    aflibercept (EYLEA) SOLN 2 mg    aflibercept (EYLEA) SOLN 2 mg    2. Subretinal hemorrhage of both eyes  H35.63 Color Fundus Photography Optos - OU - Both Eyes    3. Serous retinal detachment of left eye  H33.22     4. 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 Essential hypertension  I10     7. Hypertensive retinopathy of both eyes  H35.033     8. Pseudophakia of both eyes  Z96.1      1-3. Exudative age related macular degeneration / PECHR OU  - interval conversion to exu ARMD OD noted on 11.18.22 exam -- new SRH and exudates IT periphery  - initially presented with symptomatic floaters OS  - initial exam showed large peripheral temporal subretinal hemorrhage OS -- spanning 2-430 -- also mild vitreous opacities and condensations that were  causing symptomatic floaters  - B-scan (9.18.20) showed hyperreflective, ill-defined mass, 0300 periphery OS  - was seen by Dr. Pearletha Furl, Ocular Oncologist, on 9.22.2020 -- no tumor on exam or b-scan  - s/p IVA OS #1 (08.05.20) for subretinal hemorrhage, peripheral CNV  - s/p IVA OD #1 (11.18.22), #2 (12.16.22), #3 (01.13.23), #4 (02.10.23), #5 (03.24.23)  - s/p IVE OS #1 (09.02.20) - sample, #2 (10.12.20), #3 (11.11.20), #4 (12.09.20), #5 (1.20.21), #6 (02.17.21), #7 (03.19.21), #8 (04.30.21), #9 (06.16.21), #10 (08.11.21), #11 (12.01.21), #12 (01.26.22), #13 (03.30.22), #14 (06.01.22), #15 (08.10.22), #16 (09.23.22), #17 (11.18.22), #18 (02.10.23), #19 (03.24.23), #20 (05.08.23), #21 (06.21.23)  - s/p IVE OD #1 (05.08.23), #2 (06.21.23)  - today BCVA 20/25 OD - stable; OS 20/30 -- stable  - exam shows improving peripheral SRH OD and stable improvement in central edema OS  - OCT shows OD: stable ERM with blunting of foveal contour; retinoschisis with PEDs within IT quad caught on widefield -- persistent; OS: stable improvement in central cystic changes/edema, persistent ERM with PRF at 8 wks  - continue Prolensa and PF to TID OS for possible CME component -- decrease to BID   - recommend IVE OD #3 and IVE OS #22 today, 08.16.23 w/ ext f/u to 10 wks  - pt wishes to proceed with injections  - RBA of procedure discussed, questions answered   - see procedure note  - IVE OU informed consent obtained and signed, 05.08.23  - Eylea[employer] Benefits investigation initiated 09.02.2020 -- approved for 2023  - f/u 10 weeks, sooner prn, OCT/DFE/OPTOS COLORS OU, possible injections  4. Epiretinal membrane, OU.  - mild ERM OU  - asymptomatic, no metamorphopsia  - no indication for surgery at this time  - monitor  5. Retinoschisis OD -- stable  - bullous schisis cavity, IT quadrant, from 0700-0830, -- ?less posterior extension  - no associated RT/RD but now with [facility] and exudates -- improving   - discussed  findings and prognosis  - monitor  6,7. Hypertensive retinopathy OU  - discussed importance of tight BP control  - monitor  8. Pseudophakia OU  - s/p CE/IOL OU  - beautiful surgery, doing well  - monitor  Ophthalmic Meds Ordered this visit:  Meds ordered this encounter  Medications   aflibercept (EYLEA) SOLN 2 mg   aflibercept (EYLEA) SOLN 2 mg     Return in about 10 weeks (around 10/27/2021) for f/u exu ARMD OU, DFE, OCT.  There are no Patient Instructions on file for this visit.   Explained the diagnoses, plan, and follow up with the patient and they expressed understanding.  Patient expressed understanding of the importance of proper follow up care.  This document serves as a record of services personally performed by Karie Chimera, MD, PhD. It was created on their behalf by De Blanch, an ophthalmic technician. The  creation of this record is the provider's dictation and/or activities during the visit.    Electronically signed by: De Blanch, OA, 08/18/21  4:45 PM   Karie Chimera, M.D., Ph.D. Diseases & Surgery of the Retina and Vitreous [facility]  I have reviewed the above documentation for accuracy and completeness, and I agree with the above. Karie Chimera, M.D., Ph.D. 08/18/21 4:47 PM   Abbreviations: M myopia (nearsighted); A astigmatism; H hyperopia (farsighted); P presbyopia; Mrx spectacle prescription;  CTL contact lenses; OD right eye; OS left eye; OU both eyes  XT exotropia; ET esotropia; PEK punctate epithelial keratitis; PEE punctate epithelial erosions; DES dry eye syndrome; MGD meibomian gland dysfunction; ATs artificial tears; PFAT's preservative free artificial tears; NSC nuclear sclerotic cataract; PSC posterior subcapsular cataract; ERM epi-retinal membrane; PVD posterior vitreous detachment; RD retinal detachment; DM diabetes mellitus; DR diabetic retinopathy; NPDR non-proliferative diabetic retinopathy; PDR  proliferative diabetic retinopathy; CSME clinically significant macular edema; DME diabetic macular edema; dbh dot blot hemorrhages; CWS cotton wool spot; POAG primary open angle glaucoma; C/D cup-to-disc ratio; HVF humphrey visual field; GVF goldmann visual field; OCT optical coherence tomography; IOP intraocular pressure; BRVO Branch retinal vein occlusion; CRVO central retinal vein occlusion; CRAO central retinal artery occlusion; BRAO branch retinal artery occlusion; RT retinal tear; SB scleral buckle; PPV pars plana vitrectomy; VH Vitreous hemorrhage; PRP panretinal laser photocoagulation; IVK intravitreal kenalog; VMT vitreomacular traction; MH Macular hole;  NVD neovascularization of the disc; NVE neovascularization elsewhere; AREDS age related eye disease study; ARMD age related macular degeneration; POAG primary open angle glaucoma; EBMD epithelial/anterior basement membrane dystrophy; ACIOL anterior chamber intraocular lens; IOL intraocular lens; PCIOL posterior chamber intraocular lens; Phaco/IOL phacoemulsification with intraocular lens placement; PRK photorefractive keratectomy; LASIK laser assisted in situ keratomileusis; HTN hypertension; DM diabetes mellitus; COPD chronic obstructive pulmonary disease

## 2021-10-21 NOTE — Progress Notes (Signed)
[facility] - Clinic Note  10/27/2021     CHIEF COMPLAINT Patient presents for Retina Follow Up  HISTORY OF PRESENT ILLNESS: Tracey Rosario is a 88 y.o. female who presents to the clinic today for:   HPI     Retina Follow Up   Patient presents with  Wet AMD.  In both eyes.  This started months ago.  Duration of 10 weeks.  Since onset it is stable.  I, the attending physician,  performed the HPI with the patient and updated documentation appropriately.        Comments   Patient denies noticing any vision changes at this time.       Last edited by Rennis ChrisZamora, Kejon Feild, MD on 10/27/2021  5:13 PM.     Referring physician: Courtney ParisMcCoy, Rachel, NP [address] [place] [id number]  HISTORICAL INFORMATION:   Selected notes from the MEDICAL RECORD NUMBER Referred by Dr. Baker Pierinihris Weaver for retinal eval   CURRENT MEDICATIONS: Current Outpatient Medications (Ophthalmic Drugs)  Medication Sig   Bromfenac Sodium (PROLENSA) 0.07 % SOLN Place 1 drop into the left eye 3 (three) times daily.   polyvinyl alcohol (LIQUIFILM TEARS) 1.4 % ophthalmic solution Place 1 drop into both eyes as needed for dry eyes.   prednisoLONE acetate (PRED FORTE) 1 % ophthalmic suspension Place 1 drop into the left eye 4 (four) times daily.   prednisoLONE acetate (PRED FORTE) 1 % ophthalmic suspension Place 1 drop into the left eye 3 (three) times daily.   RESTASIS 0.05 % ophthalmic emulsion  (Patient not taking: Reported on 03/26/2021)   No current facility-administered medications for this visit. (Ophthalmic Drugs)   Current Outpatient Medications (Other)  Medication Sig   ALPRAZolam (XANAX) 0.25 MG tablet    amLODipine (NORVASC) 10 MG tablet Take 1 tablet by mouth daily.   amLODipine (NORVASC) 5 MG tablet Take 5 mg by mouth daily.   aspirin EC 81 MG tablet Take 1 tablet (81 mg total) by mouth daily.   Biotin 1000 MCG tablet Take by mouth.   cephALEXin (KEFLEX) 500 MG capsule   (Patient not taking: Reported on 03/26/2021)   Cholecalciferol (VITAMIN D3) 125 MCG (5000 UT) TABS Take 5,000 Units by mouth daily.    citalopram (CELEXA) 20 MG tablet Take 20 mg by mouth daily.   COVID-19 mRNA vaccine, Pfizer, 30 MCG/0.3ML injection    donepezil (ARICEPT) 10 MG tablet Take 10 mg by mouth daily.   estradiol (ESTRACE) 0.1 MG/GM vaginal cream    ferrous gluconate (FERGON) 324 MG tablet Take 324 mg by mouth daily.   gabapentin (NEURONTIN) 100 MG capsule Take 200 mg by mouth 2 (two) times daily.   gabapentin (NEURONTIN) 300 MG capsule    levothyroxine (SYNTHROID) 50 MCG tablet Take 50 mcg by mouth daily before breakfast.   levothyroxine (SYNTHROID) 75 MCG tablet Take 75 mcg by mouth daily.   lisinopril (PRINIVIL,ZESTRIL) 30 MG tablet TAKE 1 TABLET DAILY (NEED AN APPOINTMENT)   lisinopril (ZESTRIL) 10 MG tablet Take 10 mg by mouth daily.   meloxicam (MOBIC) 7.5 MG tablet Take 7.5 mg by mouth daily.    methocarbamol (ROBAXIN) 500 MG tablet Take 1 tablet (500 mg total) by mouth 2 (two) times daily as needed.   Multiple Vitamins-Minerals (CENTRUM SILVER) tablet Take 1 tablet by mouth daily.   NONFORMULARY OR COMPOUNDED ITEM Peripheral Neuropathy Cream: Bupivacaine 1%, Doxepin 3%, Gabapentin 6%, Pentoxifylline 3%, Topiramate 1% [facility]  nystatin (MYCOSTATIN) 100000 UNIT/ML suspension    omeprazole (PRILOSEC) 20 MG capsule TAKE 1 CAPSULE DAILY   oxybutynin (DITROPAN-XL) 5 MG 24 hr tablet Take 5 mg by mouth daily.   pravastatin (PRAVACHOL) 20 MG tablet    predniSONE (STERAPRED UNI-PAK 21 TAB) 5 MG (21) TBPK tablet  (Patient not taking: Reported on 03/26/2021)   Probiotic Product (PROBIOTIC PO) Take 1 tablet by mouth daily.   No current facility-administered medications for this visit. (Other)   REVIEW OF SYSTEMS: ROS   Positive for: Gastrointestinal, Neurological, Genitourinary, Musculoskeletal, Cardiovascular, Eyes Negative for: Constitutional, Skin,  HENT, Endocrine, Respiratory, Psychiatric, Allergic/Imm, Heme/Lymph Last edited by Julieanne Cotton, COT on 10/27/2021  1:12 PM.     ALLERGIES Allergies  Allergen Reactions   Codeine Other (See Comments)    Makes her crazy   PAST MEDICAL HISTORY Past Medical History:  Diagnosis Date   Acid reflux    Anemia    Arthropathy    Bradycardia    beta blocker reduced in 2021 due to this   CKD (chronic kidney disease), stage III (HCC)    Cognitive decline    referred to neurologist   Depression 10/07/2013   daughter didn't know anything about this   Dizziness    Frequent PVCs    GERD (gastroesophageal reflux disease) 05/04/2013   Hip fx (HCC)    r hip, bilat pubis rami   Hyperlipemia    Hypertension    Hypertensive retinopathy    OU   Hypothyroidism    Insomnia    Macular degeneration    Exu OS   Multilevel degenerative disc disease    Palpitations 05/04/2013   saw cardiologist in 2015, he deleted A. Fib from the record as per note not documentation to support   Premature atrial contractions    PSVT (paroxysmal supraventricular tachycardia)    a. short runs by monitor in 2021.   Short-term memory loss    Vitamin D deficiency    Past Surgical History:  Procedure Laterality Date   ABDOMINAL HYSTERECTOMY  1971   BLADDER SURGERY  2010   Broken Pelvis  2015   C-EYE SURGERY PROCEDURE Bilateral    zaldivar   CATARACT EXTRACTION B[place]    [place]   EYE SURGERY Bilateral    Cat Sx   HIP PINNING,CANNULATED Right 10/07/2013   Procedure: CANNULATED screws right hip;  Surgeon: Kathryne Hitch, MD;  Location: [facility] OR;  Service: Orthopedics;  Laterality: Right;   FAMILY HISTORY Family History  Problem Relation Age of Onset   Prostate cancer Father    Heart attack Sister    Cancer Brother    Cancer Sister    Heart attack Sister    Cancer Sister    Stroke Daughter    Dementia Neg Hx    Alzheimer's disease Neg Hx    SOCIAL HISTORY Social History    Tobacco Use   Smoking status: Former    Types: Cigarettes    Quit date: 08/02/1970    Years since quitting: 51.2   Smokeless tobacco: Never   Tobacco comments:    "smoked very little"  Vaping Use   Vaping Use: Never used  Substance Use Topics   Alcohol use: Never    Alcohol/week: 0.0 standard drinks of alcohol   Drug use: Never       OPHTHALMIC EXAM:  Base Eye Exam     Visual Acuity (Snellen - Linear)       Right Left  Dist [place] 20/25 +1 20/40 +1   Dist ph Fidelity  NI         Tonometry (Tonopen, 1:19 PM)       Right Left   Pressure 12 12         Pupils       Dark Light Shape React APD   Right 2 1 Round Slow None   Left 2 1 Round Slow None         Visual Fields       Left Right    Full Full         Extraocular Movement       Right Left    Full, Ortho Full, Ortho         Neuro/Psych     Oriented x3: Yes   Mood/Affect: Normal         Dilation     Both eyes: 1.0% Mydriacyl, 2.5% Phenylephrine @ 1:12 PM           Slit Lamp and Fundus Exam     Slit Lamp Exam       Right Left   Lids/Lashes Dermatochalasis - upper lid Dermatochalasis - upper lid   Conjunctiva/Sclera White and quiet White and quiet   Cornea Arcus, 1-2+ Punctate epithelial erosions, mild EBMD and corneal haze Arcus, 2-3+ Punctate epithelial erosions, EBMD, irregular epi   Anterior Chamber Deep and quiet Deep and quiet   Iris Round and dilated Round and moderately dilated to 12mm   Lens PC IOL in good position with open PC, anterior capsular phimosis PC IOL in good position with open PC   Anterior Vitreous Vitreous syneresis, Posterior vitreous detachment Vitreous syneresis; PVD; vitreous condensations         Fundus Exam       Right Left   Disc Mild temporal Pallor, Peripapillary atrophy, Sharp rim, Compact mild tilt, 2+ temporal Pallor, Peripapillary atrophy, Compact, Sharp rim   C/D Ratio 0.1 0.0   Macula Flat, Blunted foveal reflex, mild Epiretinal membrane,  drusen, RPE mottling and clumping, No heme or edema Flat, Blunted foveal reflex, RPE mottling and clumping, Epiretinal membrane, mild interval increase in central cystic changes, No heme   Vessels attenuated, Tortuous attenuated, mild tortuosity   Periphery IT schisis cavity from 0700-0830; otherwise attached; stable improvement in [facility] and exudation under schisis cavities [facility] not red and pigmenting in), exudation resolved, focal pigmented CR scar at 0630; no red heme temporal sub-retinal hemorrhage improved - now white with surrounding pigment clumping;  No SRF -- completely resolved, focal, punctate IRH - improved, shallow schisis IT quad, No heme           Refraction     Manifest Refraction   Unable to improve with refraction           IMAGING AND PROCEDURES  Imaging and Procedures for @TODAY @  OCT, Retina - OU - Both Eyes       Right Eye Quality was good. Central Foveal Thickness: 342. Progression has been stable. 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   Left Eye Quality was good. Central Foveal Thickness: 339. Progression has worsened. Findings include no SRF, abnormal foveal contour, retinal drusen , epiretinal membrane, intraretinal fluid, preretinal fibrosis (Mild interval increase in central cystic changes/edema, persistent ERM with PRF).   Notes *  Images captured and stored on drive  Diagnosis / Impression:  OD: stable ERM with blunted foveal contour, bullous schisis with PEDs within IT quad caught on widefield -- persistent OS: Mild interval increase in central cystic changes/edema, persistent ERM with PRF  Clinical management:  See below  Abbreviations: NFP - Normal foveal profile. CME - cystoid macular edema. PED - pigment epithelial detachment.  IRF - intraretinal fluid. SRF - subretinal fluid. EZ - ellipsoid zone. ERM - epiretinal membrane. ORA - outer retinal atrophy. ORT - outer retinal tubulation. SRHM - subretinal hyper-reflective material      Color Fundus Photography Optos - OU - Both Eyes       Right Eye Progression has been stable. Disc findings include normal observations. Macula : retinal pigment epithelium abnormalities. Vessels : tortuous vessels, attenuated. Periphery : exudates, hemorrhage, RPE abnormality (Peripheral subretinal hemes IT periphery -- improved, surrounding exudates improved).   Left Eye Progression has been stable. Disc findings include normal observations. Macula : normal observations. Vessels : attenuated. Periphery : hemorrhage, RPE abnormality (Stable improvement in sub retinal hemorrhage temporally (0200) -- SRH now white and pigment clumping surrounding).   Notes **Images stored on drive**  Impression: OD: IT schisis -- Peripheral subretinal hemes IT periphery -- improved, surrounding exudates improved -- PECHR OS: PEHCR with stably improved sub retinal hemorrhage temporally (0200)       Intravitreal Injection, Pharmacologic Agent - OS - Left Eye       Time Out 10/27/2021. 2:14 PM. Confirmed correct patient, procedure, site, and patient consented.   Anesthesia Topical anesthesia was used. Anesthetic medications included Lidocaine 2%, Proparacaine 0.5%.   Procedure Preparation included 5% betadine to ocular surface, eyelid speculum. A (32g) needle was used.   Injection: 2 mg aflibercept 2 MG/0.05ML   Route: Intravitreal, Site: Left Eye   NDC: [id number], Lot: [id number], Expiration date: 02/03/2023, Waste: 0 mL   Post-op Post injection exam found visual acuity of at least counting fingers. The patient tolerated the procedure well. There were no complications. The patient received written and verbal post procedure care education. Post injection medications were not given.             ASSESSMENT/PLAN:    ICD-10-CM   1. Exudative age-related macular degeneration of both eyes with active choroidal neovascularization (HCC)  H35.3231 OCT, Retina - OU - Both Eyes    Color Fundus Photography Optos - OU - Both Eyes    Intravitreal Injection, Pharmacologic Agent - OS - Left Eye    aflibercept (EYLEA) SOLN 2 mg    2. Subretinal hemorrhage of both eyes  H35.63 Color Fundus Photography Optos - OU - Both Eyes    3. Serous retinal detachment of left eye  H33.22 Color Fundus Photography Optos - OU - Both Eyes    4. 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 Essential hypertension  I10     7. Hypertensive retinopathy of both eyes  H35.033     8. Pseudophakia of both eyes  Z96.1       1-3. Exudative age related macular degeneration / PECHR OU  - interval conversion to exu ARMD OD noted on 11.18.22 exam -- new SRH and exudates IT periphery  - initially presented with symptomatic floaters OS  - initial exam showed large peripheral temporal subretinal hemorrhage OS -- spanning 2-430 -- also mild vitreous opacities and condensations that were causing symptomatic floaters  -  B-scan (9.18.20) showed hyperreflective, ill-defined mass, 0300 periphery OS  - was seen by Dr. Pearletha Furl, Ocular Oncologist, on 9.22.2020 -- no tumor on exam or b-scan  - s/p IVA OS #1 (08.05.20) for subretinal hemorrhage, peripheral CNV  - s/p IVA OD #1 (11.18.22), #2 (12.16.22), #3 (01.13.23), #4 (02.10.23), #5 (03.24.23)  - s/p IVE OS #1 (09.02.20) - sample, #2 (10.12.20), #3 (11.11.20), #4 (12.09.20), #5 (1.20.21), #6 (02.17.21), #7 (03.19.21), #8 (04.30.21), #9 (06.16.21), #10 (08.11.21), #11 (12.01.21), #12 (01.26.22), #13 (03.30.22), #14 (06.01.22), #15 (08.10.22), #16 (09.23.22), #17 (11.18.22), #18 (02.10.23), #19 (03.24.23), #20 (05.08.23), #21 (06.21.23), #22 (08.16.23)  - s/p IVE OD #1 (05.08.23), #2 (06.21.23), #3 (08.16.23)  - today BCVA 20/25  OD - stable; OS 20/40 -- decreased  - exam shows improved peripheral SRH OD and stable improvement in central edema OS  - OCT shows OD: stable ERM with blunted foveal contour, bullous schisis with PEDs within IT quad caught on widefield -- persistent; OS: Mild interval increase in central cystic changes/edema, persistent ERM with PRF at 10 weeks  - continue Prolensa and PF BID OS for possible CME component  - recommend IVE OS #23 today,10.25.23 w/ f/u back to 8 wks  - will hold OD today  - pt in agreement  - RBA of procedure discussed, questions answered   - see procedure note  - IVE OU informed consent obtained and signed, 05.08.23  - Eyle[employer] Benefits investigation initiated 09.02.2020 -- approved for 2023  - f/u 8 weeks, sooner prn, OCT/DFE/OPTOS COLORS OU, possible injections  4. Epiretinal membrane, OU.  - mild ERM OU  - asymptomatic, no metamorphopsia  - no indication for surgery at this time  - monitor  5. Retinoschisis OD -- stable  - bullous schisis cavity, IT quadrant, from 0700-0830, -- ?less posterior extension  - no associated RT/RD but now with [facility] and exudates -- improving   - discussed findings and prognosis  - monitor  6,7. Hypertensive retinopathy OU  - discussed importance of tight BP control  - monitor  8. Pseudophakia OU  - s/p CE/IOL OU  - beautiful surgery, doing well  - monitor  Ophthalmic Meds Ordered this visit:  Meds ordered this encounter  Medications   aflibercept (EYLEA) SOLN 2 mg     Return in about 8 weeks (around 12/22/2021) for f/u exu ARMD OU, DFE, OCT.  There are no Patient Instructions on file for this visit.   Explained the diagnoses, plan, and follow up with the patient and they expressed understanding.  Patient expressed understanding of the importance of proper follow up care.  This document serves as a record of services personally performed by Karie Chimera, MD, PhD. It was created on their behalf by De Blanch, an  ophthalmic technician. The creation of this record is the provider's dictation and/or activities during the visit.    Electronically signed by: De Blanch, OA, 10/27/21  11:12 PM  This document serves as a record of services personally performed by Karie Chimera, MD, PhD. It was created on their behalf by Glee Arvin. Manson Passey, OA an ophthalmic technician. The creation of this record is the provider's dictation and/or activities during the visit.    Electronically signed by: Glee Arvin. [place], [place] 10.25.2023 11:12 PM  Karie Chimera, M.D., Ph.D. Diseases & Surgery of the Retina and Vitreous [facility]  I have reviewed the above documentation for accuracy and completeness, and I agree with the above. Karie Chimera,  M.D., Ph.D. 10/27/21 11:14 PM   Abbreviations: M myopia (nearsighted); A astigmatism; H hyperopia (farsighted); P presbyopia; Mrx spectacle prescription;  CTL contact lenses; OD right eye; OS left eye; OU both eyes  XT exotropia; ET esotropia; PEK punctate epithelial keratitis; PEE punctate epithelial erosions; DES dry eye syndrome; MGD meibomian gland dysfunction; ATs artificial tears; PFAT's preservative free artificial tears; [place] nuclear sclerotic cataract; PSC posterior subcapsular cataract; ERM epi-retinal membrane; PVD posterior vitreous detachment; RD retinal detachment; DM diabetes mellitus; DR diabetic retinopathy; NPDR non-proliferative diabetic retinopathy; PDR proliferative diabetic retinopathy; CSME clinically significant macular edema; DME diabetic macular edema; dbh dot blot hemorrhages; CWS cotton wool spot; POAG primary open angle glaucoma; C/D cup-to-disc ratio; HVF humphrey visual field; GVF goldmann visual field; OCT optical coherence tomography; IOP intraocular pressure; BRVO Branch retinal vein occlusion; CRVO central retinal vein occlusion; CRAO central retinal artery occlusion; BRAO branch retinal artery occlusion; RT retinal tear; SB  scleral buckle; PPV pars plana vitrectomy; VH Vitreous hemorrhage; PRP panretinal laser photocoagulation; IVK intravitreal kenalog; VMT vitreomacular traction; MH Macular hole;  NVD neovascularization of the disc; NVE neovascularization elsewhere; AREDS age related eye disease study; ARMD age related macular degeneration; POAG primary open angle glaucoma; EBMD epithelial/anterior basement membrane dystrophy; ACIOL anterior chamber intraocular lens; IOL intraocular lens; PCIOL posterior chamber intraocular lens; Phaco/IOL phacoemulsification with intraocular lens placement; [place] photorefractive keratectomy; LASIK laser assisted in situ keratomileusis; HTN hypertension; DM diabetes mellitus; COPD chronic obstructive pulmonary disease

## 2021-10-27 ENCOUNTER — Ambulatory Visit (INDEPENDENT_AMBULATORY_CARE_PROVIDER_SITE_OTHER): Payer: Medicare Other | Admitting: Ophthalmology

## 2021-10-27 ENCOUNTER — Encounter (INDEPENDENT_AMBULATORY_CARE_PROVIDER_SITE_OTHER): Payer: Self-pay | Admitting: Ophthalmology

## 2021-10-27 DIAGNOSIS — H3563 Retinal hemorrhage, bilateral: Secondary | ICD-10-CM

## 2021-10-27 DIAGNOSIS — H3322 Serous retinal detachment, left eye: Secondary | ICD-10-CM | POA: Diagnosis not present

## 2021-10-27 DIAGNOSIS — H353231 Exudative age-related macular degeneration, bilateral, with active choroidal neovascularization: Secondary | ICD-10-CM

## 2021-10-27 DIAGNOSIS — Z961 Presence of intraocular lens: Secondary | ICD-10-CM

## 2021-10-27 DIAGNOSIS — H35033 Hypertensive retinopathy, bilateral: Secondary | ICD-10-CM

## 2021-10-27 DIAGNOSIS — H35373 Puckering of macula, bilateral: Secondary | ICD-10-CM

## 2021-10-27 DIAGNOSIS — H33101 Unspecified retinoschisis, right eye: Secondary | ICD-10-CM

## 2021-10-27 DIAGNOSIS — I10 Essential (primary) hypertension: Secondary | ICD-10-CM

## 2021-10-27 MED ORDER — AFLIBERCEPT 2MG/0.05ML IZ SOLN FOR KALEIDOSCOPE
2.0000 mg | INTRAVITREAL | Status: AC | PRN
  Administered 2021-10-27: 2 mg via INTRAVITREAL

## 2021-11-17 ENCOUNTER — Encounter: Payer: Self-pay | Admitting: Physician Assistant

## 2021-11-17 ENCOUNTER — Ambulatory Visit (INDEPENDENT_AMBULATORY_CARE_PROVIDER_SITE_OTHER): Payer: Medicare Other | Admitting: Physician Assistant

## 2021-11-17 VITALS — BP 111/55 | HR 69 | Resp 18 | Ht 71.0 in | Wt 137.0 lb

## 2021-11-17 DIAGNOSIS — R413 Other amnesia: Secondary | ICD-10-CM | POA: Diagnosis not present

## 2021-11-17 MED ORDER — MEMANTINE HCL 5 MG PO TABS: 5.0000 mg | ORAL_TABLET | Freq: Every evening | ORAL | 11 refills | Status: DC

## 2021-11-17 NOTE — Progress Notes (Addendum)
Assessment/Plan:    The patient is seen in neurologic consultation at the request of Simona Huh, NP for the evaluation of memory.  Tracey Rosario is a very pleasant 88 y.o. year old RH female with  a history of hypertension, hyperlipidemia, GERD, iron deficiency anemia, hypothyroidism, bradycardia, anxiety, depression seen today for evaluation of memory loss dating back to 2015, seen at our office in 2017 at which time she was  diagnosed with mild cognitive impairment, with initial MoCA of  25/30. In today's visit, she presented with worsening of memory. MoCA today is  8/30. Despite being fairly independent, and being able to complete many of her ADLs,memory impairment is noted.     Mild dementia    Continue donepezil 10 mg  as per PCP, monitor her heart rate, in view of noted history of bradycardia Start memantine 5 mg at night, side effects discussed  Recommend good control of cardiovascular risk factors.    Folllow up in  3 months    Subjective:    The patient is accompanied by her daughter who supplements the history.    How long did patient have memory difficulties? She has memory difficulties dating back to 2015, and had neurological evaluation in 2017 and in 2021, with diagnosis of MCI, but her memory has been worse for the last 6 months-  year, when she has become very repetitive as per daughter's reports. She may forget recent conversations or people's names but not worse than on her first visit in 2017. She is still able to do ADLS. She likes to watch TV , does not do crossword puzzles or word finding repeats oneself? Endorsed, within 5 minutes 6-7 times a day  Disoriented when walking into a room?  Patient denies   Leaving objects in unusual places?  Patient denies   Patient lives alone Ambulates  with difficulty?   Patient denies   Recent falls?  Patient denies   Any head injuries?  Patient denies   History of seizures?   Patient denies   Wandering behavior?   Patient denies   Patient drives?   Patient no longer drives, does not leave the neighborhood, only goes to the mailbox and back Any mood changes ?  denies Any depression?:  Patient denies   Hallucinations?  Patient denies   Paranoia?  Patient denies   Patient reports that sleeps well without vivid dreams, REM behavior or sleepwalking    History of sleep apnea?  Patient denies   Any hygiene concerns?  Patient denies   Independent of bathing and dressing?  Endorsed  Does the patient needs help with medications? Patient  in charge  Who is in charge of the finances? Daughter  is in charge   Any changes in appetite? Lost sense of taste, or smell for  the last year . Lost 10 lbs in 1 months, not drinking enough water  Patient have trouble swallowing? Patient denies   Does the patient cook? Endorsed   Any kitchen accidents such as leaving the stove on? Patient denies   Any headaches?  Patient denies   Double vision? Patient denies   Any focal numbness or tingling?  Patient denies   Chronic back pain Patient denies   Unilateral weakness?  Patient denies   Any tremors?  Patient denies   Any history of anosmia?  Patient denies   Any incontinence of urine? Endorsed, uses diapers Any bowel dysfunction?   Patient denies   History of heavy alcohol intake?  Patient denies   History of heavy tobacco use?  Patient denies   Family history of dementia?  Negative for  Alzheimer's disease       Allergies  Allergen Reactions   Codeine Other (See Comments)    Makes her crazy    Current Outpatient Medications  Medication Instructions   ALPRAZolam (XANAX) 0.25 MG tablet No dose, route, or frequency recorded.   amLODipine (NORVASC) 10 MG tablet 1 tablet, Oral, Daily   amLODipine (NORVASC) 5 mg, Daily   aspirin EC 81 mg, Oral, Daily   Biotin 1000 MCG tablet Oral   Bromfenac Sodium (PROLENSA) 0.07 % SOLN 1 drop, Left Eye, 3 times daily   cephALEXin (KEFLEX) 500 MG capsule No dose, route, or  frequency recorded.   citalopram (CELEXA) 20 mg, Oral, Daily   COVID-19 mRNA vaccine, Pfizer, 30 MCG/0.3ML injection No dose, route, or frequency recorded.   donepezil (ARICEPT) 10 mg, Oral, Daily   estradiol (ESTRACE) 0.1 MG/GM vaginal cream No dose, route, or frequency recorded.   ferrous gluconate (FERGON) 324 mg, Oral, Daily   gabapentin (NEURONTIN) 300 MG capsule No dose, route, or frequency recorded.   gabapentin (NEURONTIN) 200 mg, Oral, 2 times daily   levothyroxine (SYNTHROID) 50 mcg, Oral, Daily before breakfast   levothyroxine (SYNTHROID) 75 mcg, Oral, Daily   lisinopril (PRINIVIL,ZESTRIL) 30 MG tablet TAKE 1 TABLET DAILY (NEED AN APPOINTMENT)   lisinopril (ZESTRIL) 10 mg, Oral, Daily   meloxicam (MOBIC) 7.5 mg, Oral, Daily   memantine (NAMENDA) 5 mg, Oral, Nightly   Multiple Vitamins-Minerals (CENTRUM SILVER) tablet 1 tablet, Oral, Daily   NONFORMULARY OR COMPOUNDED ITEM Peripheral Neuropathy Cream:<BR>Bupivacaine 1%, Doxepin 3%, Gabapentin 6%, Pentoxifylline 3%, Topiramate 1%<BR>Order faxed to [facility]   nystatin (MYCOSTATIN) 100000 UNIT/ML suspension No dose, route, or frequency recorded.   omeprazole (PRILOSEC) 20 MG capsule TAKE 1 CAPSULE DAILY   oxybutynin (DITROPAN-XL) 5 mg, Oral, Daily   polyvinyl alcohol (LIQUIFILM TEARS) 1.4 % ophthalmic solution 1 drop, Both Eyes, As needed   pravastatin (PRAVACHOL) 20 MG tablet No dose, route, or frequency recorded.   prednisoLONE acetate (PRED FORTE) 1 % ophthalmic suspension 1 drop, Left Eye, 4 times daily   prednisoLONE acetate (PRED FORTE) 1 % ophthalmic suspension 1 drop, Left Eye, 3 times daily   predniSONE (STERAPRED UNI-PAK 21 TAB) 5 MG (21) TBPK tablet No dose, route, or frequency recorded.   Probiotic Product (PROBIOTIC PO) 1 tablet, Oral, Daily   RESTASIS 0.05 % ophthalmic emulsion No dose, route, or frequency recorded.   Vitamin D3 5,000 Units, Oral, Daily     VITALS:   Vitals:   11/17/21 1327  BP: (!)  111/55  Pulse: 69  Resp: 18  SpO2: 97%  Weight: 137 lb (62.1 kg)  Height: 5\' 11"  (1.803 m)      02/22/2017   11:41 AM 02/02/2017    1:39 PM 09/22/2015    7:42 AM 05/08/2015    1:02 PM  Depression screen PHQ 2/9  Decreased Interest 0 0 0 0  Down, Depressed, Hopeless 0 0 0 0  PHQ - 2 Score 0 0 0 0  Altered sleeping  1    Tired, decreased energy  1    Change in appetite  0    Feeling bad or failure about yourself   0    Trouble concentrating  0    Moving slowly or fidgety/restless  0    Suicidal thoughts  0    PHQ-9 Score  2  PHYSICAL EXAM   HEENT:  Normocephalic, atraumatic. The mucous membranes are moist. The superficial temporal arteries are without ropiness or tenderness. Cardiovascular: Regular rate and rhythm. Lungs: Clear to auscultation bilaterally. Neck: There are no carotid bruits noted bilaterally.  NEUROLOGICAL:    11/17/2021    1:00 PM 10/13/2015    3:00 PM  Montreal Cognitive Assessment   Visuospatial/ Executive (0/5) 0 5  Naming (0/3) 2 2  Attention: Read list of digits (0/2) 2 2  Attention: Read list of letters (0/1) 1 1  Attention: Serial 7 subtraction starting at 100 (0/3) 0 1  Language: Repeat phrase (0/2) 1 2  Language : Fluency (0/1) 0 0  Abstraction (0/2) 0 2  Delayed Recall (0/5) 0 4  Orientation (0/6) 2 5  Total 8 24  Adjusted Score (based on education) 8 25       11/12/2018    2:02 PM 02/22/2017    2:07 PM 04/07/2015    1:28 PM  MMSE - Mini Mental State Exam  Orientation to time 3 5 5   Orientation to Place 4 5 4   Registration 3 3 3   Attention/ Calculation 5 1 5   Recall 3 1 3   Language- name 2 objects 2 2 2   Language- repeat 0 1 1  Language- follow 3 step command 3 3 2   Language- read & follow direction 1 1 1   Write a sentence 0 1 1  Copy design 1 0 1  Total score 25 23 28      Orientation:  Alert and oriented to person, place and not to date. No aphasia or dysarthria. Fund of knowledge is reduced. Recent and remote memory  impaired  Attention and concentration are reduced.  Able to name objects and repeat phrases. Delayed recall  0/5 Cranial nerves: There is good facial symmetry. Extraocular muscles are intact and visual fields are full to confrontational testing. Speech is fluent and clear. Soft palate rises symmetrically and there is no tongue deviation. Hearing is intact to conversational tone. Tone: Tone is good throughout. Sensation: Sensation is intact to light touch and pinprick throughout. Vibration is intact at the bilateral big toe.  There is no sensory dermatomal level identified. Coordination: The patient has no difficulty with RAM's or FNF bilaterally. Normal finger to nose  Motor: Strength is 5/5 in the bilateral upper and lower extremities. There is no pronator drift. There are no fasciculations noted. Tremors: L essential tremor,mild B postural tremor. No cogwheel DTR's: Deep tendon reflexes are 2/4 at the bilateral biceps, triceps, brachioradialis, patella and achilles.  Plantar responses are downgoing bilaterally. Gait and Station: The patient is able to ambulate without difficulty. The patient is able to ambulate in a tandem fashion, mild postural instability   Thank you for allowing [place] the opportunity to participate in the care of this nice patient. Please do not hesitate to contact us for any questions or concerns.   Total time spent on today's visit was 60 minutes dedicated to this patient today, preparing to see patient, examining the patient, ordering tests and/or medications and counseling the patient, documenting clinical information in the EHR or other health record, independently interpreting results and communicating results to the patient/family, discussing treatment and goals, answering patient's questions and coordinating care.  Cc:  Simona Huh, NP  Sharene Butters 11/17/2021 6:15 PM

## 2021-11-17 NOTE — Patient Instructions (Addendum)
It was a pleasure to see you today at our office.   Recommendations:  Follow up in 3  months Continue donepezil 10 mg daily. Side effects were discussed  Start Memantine 5 mg night. Side effects were discussed   Whom to call:  Memory  decline, memory medications: Call our office [phone]   For psychiatric meds, mood meds: Please have your primary care physician manage these medications.   Counseling regarding caregiver distress, including caregiver depression, anxiety and issues regarding community resources, adult day care programs, adult living facilities, or memory care questions:   Feel free to contact Misty Lisabeth Register, Social Worker at [phone]   For assessment of decision of mental capacity and competency:  Call Dr. Erick Blinks, geriatric psychiatrist at [phone]  For guidance in geriatric dementia issues please call [facility] [phone]  For guidance regarding [facility] and if placement were needed at the facility, contact Sidney Ace, Social Worker tel: [phone]  If you have any severe symptoms of a stroke, or other severe issues such as confusion,severe chills or fever, etc call 911 or go to the ER as you may need to be evaluated further   Feel free to visit Facebook page " [email or web address]" for tips of how to care for people with memory problems.      RECOMMENDATIONS FOR ALL PATIENTS WITH MEMORY PROBLEMS: 1. Continue to exercise (Recommend 30 minutes of walking everyday, or 3 hours every week) 2. Increase social interactions - continue going to [place] and enjoy social gatherings with friends and family 3. Eat healthy, avoid fried foods and eat more fruits and vegetables 4. Maintain adequate blood pressure, blood sugar, and blood cholesterol level. Reducing the risk of stroke and cardiovascular disease also helps promoting better memory. 5. Avoid stressful situations. Live a simple life and avoid aggravations. Organize  your time and prepare for the next day in anticipation. 6. Sleep well, avoid any interruptions of sleep and avoid any distractions in the bedroom that may interfere with adequate sleep quality 7. Avoid sugar, avoid sweets as there is a strong link between excessive sugar intake, diabetes, and cognitive impairment We discussed the Mediterranean diet, which has been shown to help patients reduce the risk of progressive memory disorders and reduces cardiovascular risk. This includes eating fish, eat fruits and green leafy vegetables, nuts like almonds and hazelnuts, walnuts, and also use olive oil. Avoid fast foods and fried foods as much as possible. Avoid sweets and sugar as sugar use has been linked to worsening of memory function.  There is always a concern of gradual progression of memory problems. If this is the case, then we may need to adjust level of care according to patient needs. Support, both to the patient and caregiver, should then be put into place.    The Alzheimer's Association is here all day, every day for people facing Alzheimer's disease through our free 24/7 Helpline: [phone]. The Helpline provides reliable information and support to all those who need assistance, such as individuals living with memory loss, Alzheimer's or other dementia, caregivers, health care professionals and the public.  Our highly trained and knowledgeable staff can help you with: Understanding memory loss, dementia and Alzheimer's  Medications and other treatment options  General information about aging and brain health  Skills to provide quality care and to find the best care from professionals  Legal, financial and living-arrangement decisions Our Helpline also features: Confidential care consultation provided by master's level clinicians who can help  with decision-making support, crisis assistance and education on issues families face every day  Help in a caller's preferred language using our  translation service that features more than 200 languages and dialects  Referrals to local community programs, services and ongoing support     FALL PRECAUTIONS: Be cautious when walking. Scan the area for obstacles that may increase the risk of trips and falls. When getting up in the mornings, sit up at the edge of the bed for a few minutes before getting out of bed. Consider elevating the bed at the head end to avoid drop of blood pressure when getting up. Walk always in a well-lit room (use night lights in the walls). Avoid area rugs or power cords from appliances in the middle of the walkways. Use a walker or a cane if necessary and consider physical therapy for balance exercise. Get your eyesight checked regularly.  FINANCIAL OVERSIGHT: Supervision, especially oversight when making financial decisions or transactions is also recommended.  HOME SAFETY: Consider the safety of the kitchen when operating appliances like stoves, microwave oven, and blender. Consider having supervision and share cooking responsibilities until no longer able to participate in those. Accidents with firearms and other hazards in the house should be identified and addressed as well.   ABILITY TO BE LEFT ALONE: If patient is unable to contact 911 operator, consider using LifeLine, or when the need is there, arrange for someone to stay with patients. Smoking is a fire hazard, consider supervision or cessation. Risk of wandering should be assessed by caregiver and if detected at any point, supervision and safe proof recommendations should be instituted.  MEDICATION SUPERVISION: Inability to self-administer medication needs to be constantly addressed. Implement a mechanism to ensure safe administration of the medications.   DRIVING: Regarding driving, in patients with progressive memory problems, driving will be impaired. We advise to have someone else do the driving if trouble finding directions or if minor accidents are  reported. Independent driving assessment is available to determine safety of driving.   If you are interested in the driving assessment, you can contact the following:  [employer] in [place] [phone]  [facility] [phone]  [facility] [phone] or [phone]      Mediterranean Diet A Mediterranean diet refers to food and lifestyle choices that are based on the traditions of countries located on the [employer]. This way of eating has been shown to help prevent certain conditions and improve outcomes for people who have chronic diseases, like kidney disease and heart disease. What are tips for following this plan? Lifestyle  Cook and eat meals together with your family, when possible. Drink enough fluid to keep your urine clear or pale yellow. Be physically active every day. This includes: Aerobic exercise like running or swimming. Leisure activities like gardening, walking, or housework. Get 7-8 hours of sleep each night. If recommended by your health care provider, drink red wine in moderation. This means 1 glass a day for nonpregnant women and 2 glasses a day for men. A glass of wine equals 5 oz (150 mL). Reading food labels  Check the serving size of packaged foods. For foods such as rice and pasta, the serving size refers to the amount of cooked product, not dry. Check the total fat in packaged foods. Avoid foods that have saturated fat or trans fats. Check the ingredients list for added sugars, such as corn syrup. Shopping  At the grocery store, buy most  of your food from the areas near the walls of the store. This includes: Fresh fruits and vegetables (produce). Grains, beans, nuts, and seeds. Some of these may be available in unpackaged forms or large amounts (in bulk). Fresh seafood. Poultry and eggs. Low-fat dairy products. Buy whole ingredients instead of prepackaged  foods. Buy fresh fruits and vegetables in-season from local farmers markets. Buy frozen fruits and vegetables in resealable bags. If you do not have access to quality fresh seafood, buy precooked frozen shrimp or canned fish, such as tuna, salmon, or sardines. Buy small amounts of raw or cooked vegetables, salads, or olives from the deli or salad bar at your store. Stock your pantry so you always have certain foods on hand, such as olive oil, canned tuna, canned tomatoes, rice, pasta, and beans. Cooking  Cook foods with extra-virgin olive oil instead of using butter or other vegetable oils. Have meat as a side dish, and have vegetables or grains as your main dish. This means having meat in small portions or adding small amounts of meat to foods like pasta or stew. Use beans or vegetables instead of meat in common dishes like chili or lasagna. Experiment with different cooking methods. Try roasting or broiling vegetables instead of steaming or sauteing them. Add frozen vegetables to soups, stews, pasta, or rice. Add nuts or seeds for added healthy fat at each meal. You can add these to yogurt, salads, or vegetable dishes. Marinate fish or vegetables using olive oil, lemon juice, garlic, and fresh herbs. Meal planning  Plan to eat 1 vegetarian meal one day each week. Try to work up to 2 vegetarian meals, if possible. Eat seafood 2 or more times a week. Have healthy snacks readily available, such as: Vegetable sticks with hummus. Greek yogurt. Fruit and nut trail mix. Eat balanced meals throughout the week. This includes: Fruit: 2-3 servings a day Vegetables: 4-5 servings a day Low-fat dairy: 2 servings a day Fish, poultry, or lean meat: 1 serving a day Beans and legumes: 2 or more servings a week Nuts and seeds: 1-2 servings a day Whole grains: 6-8 servings a day Extra-virgin olive oil: 3-4 servings a day Limit red meat and sweets to only a few servings a month What are my food  choices? Mediterranean diet Recommended Grains: Whole-grain pasta. Brown rice. Bulgar wheat. Polenta. Couscous. Whole-wheat bread. [place]. Vegetables: Artichokes. Beets. Broccoli. Cabbage. Carrots. Eggplant. Green beans. Chard. Kale. Spinach. Onions. Leeks. Peas. Squash. Tomatoes. Peppers. Radishes. Fruits: Apples. Apricots. Avocado. Berries. Bananas. Cherries. Dates. Figs. Grapes. Lemons. Melon. Oranges. Peaches. Plums. Pomegranate. Meats and other protein foods: Beans. Almonds. Sunflower seeds. Pine nuts. Peanuts. [place]. Salmon. Scallops. Shrimp. [place]. Tilapia. Clams. Oysters. Eggs. Dairy: Low-fat milk. Cheese. Greek yogurt. Beverages: Water. Red wine. Herbal tea. Fats and oils: Extra virgin olive oil. Avocado oil. Grape seed oil. Sweets and desserts: [place] yogurt with honey. Baked apples. Poached pears. Trail mix. Seasoning and other foods: Basil. Cilantro. Coriander. Cumin. Mint. Parsley. Sage. Rosemary. Tarragon. Garlic. Oregano. Thyme. Pepper. Balsalmic vinegar. Tahini. Hummus. Tomato sauce. Olives. Mushrooms. Limit these Grains: Prepackaged pasta or rice dishes. Prepackaged cereal with added sugar. Vegetables: Deep fried potatoes (french fries). Fruits: Fruit canned in syrup. Meats and other protein foods: Beef. Pork. Lamb. Poultry with skin. Hot dogs. Berniece Salines. Dairy: Ice cream. Sour cream. Whole milk. Beverages: Juice. Sugar-sweetened soft drinks. Beer. Liquor and spirits. Fats and oils: Butter. Canola oil. Vegetable oil. Beef fat (tallow). Lard. Sweets and desserts: Cookies. Cakes. Pies. Candy. Seasoning and other  foods: Mayonnaise. Premade sauces and marinades. The items listed may not be a complete list. Talk with your dietitian about what dietary choices are right for you. Summary The Mediterranean diet includes both food and lifestyle choices. Eat a variety of fresh fruits and vegetables, beans, nuts, seeds, and whole grains. Limit the amount of red meat and sweets that  you eat. Talk with your health care provider about whether it is safe for you to drink red wine in moderation. This means 1 glass a day for nonpregnant women and 2 glasses a day for men. A glass of wine equals 5 oz (150 mL). This information is not intended to replace advice given to you by your health care provider. Make sure you discuss any questions you have with your health care provider. Document Released: 08/13/2015 Document Revised: 09/15/2015 Document Reviewed: 08/13/2015 Elsevier Interactive Patient Education  2017 [employer].

## 2021-12-03 ENCOUNTER — Telehealth: Payer: Self-pay | Admitting: Physician Assistant

## 2021-12-03 NOTE — Telephone Encounter (Signed)
Pt's daughter called in wanting to see if Huntley Dec had a chance to review the CT she brought in to see if they can get a diagnosis for the patient. She is going to be needing to get some care for her and will need the diagnosis.

## 2021-12-06 NOTE — Telephone Encounter (Signed)
Pt's daughter called in again wanting to follow up if the pt has been given a diagnosis yet. I let her know Huntley Dec was going to review the disc and someone will give her a call.

## 2021-12-06 NOTE — Telephone Encounter (Signed)
Trying to get help in the home, needs clarity in the Diagnosis , please try again to open CD, she is awaiting another call back thank you

## 2021-12-06 NOTE — Telephone Encounter (Signed)
Daughter Darl Pikes is going to bring packet by to fill out

## 2021-12-10 ENCOUNTER — Other Ambulatory Visit: Payer: Self-pay | Admitting: Physician Assistant

## 2021-12-16 NOTE — Progress Notes (Signed)
[facility] Note  12/22/2021     CHIEF COMPLAINT Patient presents for Retina Follow Up  HISTORY OF PRESENT ILLNESS: Tracey Rosario is a 88 y.o. female who presents to the clinic today for:   HPI     Retina Follow Up   Patient presents with  Wet AMD.  In both eyes.  Severity is moderate.  Duration of 8 weeks.  Since onset it is stable.  I, the attending physician,  performed the HPI with the patient and updated documentation appropriately.        Comments   Pt here for 8 wk ret f/u for exu ARMD OU. Pt states VA is the same, no changes.       Last edited by Bernarda Caffey, MD on 12/22/2021  5:37 PM.    Pt states vision is stable, she is still using PF and Prolensa BID  Referring physician: Simona Huh, NP [place],  [place] [address]  HISTORICAL INFORMATION:   Selected notes from the MEDICAL RECORD NUMBER Referred by Dr. Quentin Ore for retinal eval   CURRENT MEDICATIONS: No current facility-administered medications for this visit. (Ophthalmic Drugs)   No current outpatient medications on file. (Ophthalmic Drugs)   Facility-Administered Medications Ordered in Other Visits (Ophthalmic Drugs)  Medication Route   cycloSPORINE (RESTASIS) 0.05 % ophthalmic emulsion 1 drop Both Eyes   No current facility-administered medications for this visit. (Other)   No current outpatient medications on file. (Other)   Facility-Administered Medications Ordered in Other Visits (Other)  Medication Route   acetaminophen (TYLENOL) tablet 650 mg Oral   Or   acetaminophen (TYLENOL) suppository 650 mg Rectal   acidophilus (RISAQUAD) capsule 1 capsule Oral   Ampicillin-Sulbactam (UNASYN) 3 g in sodium chloride 0.9 % 100 mL IVPB Intravenous   aspirin EC tablet 81 mg Oral   azithromycin (ZITHROMAX) tablet 500 mg Oral   cholecalciferol (VITAMIN D3) 25 MCG (1000 UNIT) tablet 5,000 Units Oral   citalopram (CELEXA) tablet 20 mg Oral    donepezil (ARICEPT) tablet 10 mg Oral   enoxaparin (LOVENOX) injection 30 mg Subcutaneous   hydrALAZINE (APRESOLINE) injection 10 mg Intravenous   lactated ringers infusion Intravenous   levothyroxine (SYNTHROID) tablet 75 mcg Oral   memantine (NAMENDA) tablet 5 mg Oral   ondansetron (ZOFRAN) tablet 4 mg Oral   Or   ondansetron (ZOFRAN) injection 4 mg Intravenous   oxybutynin (DITROPAN-XL) 24 hr tablet 5 mg Oral   pantoprazole (PROTONIX) EC tablet 40 mg Oral   polyethylene glycol (MIRALAX / GLYCOLAX) packet 17 g Oral   pravastatin (PRAVACHOL) tablet 10 mg Oral   sodium chloride flush (NS) 0.9 % injection 3 mL Intravenous   REVIEW OF SYSTEMS: ROS   Positive for: Gastrointestinal, Neurological, Genitourinary, Musculoskeletal, Cardiovascular, Eyes Negative for: Constitutional, Skin, HENT, Endocrine, Respiratory, Psychiatric, Allergic/Imm, Heme/Lymph Last edited by Kingsley Spittle, COT on 12/22/2021  1:06 PM.      ALLERGIES Allergies  Allergen Reactions   Codeine Other (See Comments)    Altered mental status   PAST MEDICAL HISTORY Past Medical History:  Diagnosis Date   Acid reflux    Anemia    Arthropathy    Bradycardia    beta blocker reduced in 2021 due to this   CKD (chronic kidney disease), stage III (HCC)    Cognitive decline    referred to neurologist   Depression 10/07/2013   daughter didn't know anything about this   Dizziness  Frequent PVCs    GERD (gastroesophageal reflux disease) 05/04/2013   Hip fx ([place])    r hip, bilat pubis rami   Hyperlipemia    Hypertension    Hypertensive retinopathy    OU   Hypothyroidism    Insomnia    Macular degeneration    Exu OS   Multilevel degenerative disc disease    Palpitations 05/04/2013   saw cardiologist in 2015, he deleted A. Fib from the record as per note not documentation to support   Premature atrial contractions    PSVT (paroxysmal supraventricular tachycardia)    a. short runs by monitor in 2021.    Short-term memory loss    Vitamin D deficiency    Past Surgical History:  Procedure Laterality Date   ABDOMINAL HYSTERECTOMY  1971   BLADDER SURGERY  2010   Broken Pelvis  2015   C-EYE SURGERY PROCEDURE Bilateral    zaldivar   CATARACT EXTRACTION Bilateral    Done in [place] Bilateral    Cat Sx   HIP PINNING,CANNULATED Right 10/07/2013   Procedure: CANNULATED screws right hip;  Surgeon: Mcarthur Rossetti, MD;  Location: [facility];  Service: Orthopedics;  Laterality: Right;   FAMILY HISTORY Family History  Problem Relation Age of Onset   Prostate cancer Father    Heart attack Sister    Cancer Brother    Cancer Sister    Heart attack Sister    Cancer Sister    Stroke Daughter    Dementia Neg Hx    Alzheimer's disease Neg Hx    SOCIAL HISTORY Social History   Tobacco Use   Smoking status: Former    Types: Cigarettes    Quit date: 08/02/1970    Years since quitting: 51.4   Smokeless tobacco: Never   Tobacco comments:    "smoked very little"  Vaping Use   Vaping Use: Never used  Substance Use Topics   Alcohol use: Never    Alcohol/week: 0.0 standard drinks of alcohol   Drug use: Never       OPHTHALMIC EXAM:  Base Eye Exam     Visual Acuity (Snellen - Linear)       Right Left   Dist [place] 20/30 20/70   Dist ph [place] NI 20/40 -3         Tonometry (Tonopen, 1:13 PM)       Right Left   Pressure 11 13         Pupils       Pupils Dark Light Shape React APD   Right PERRL 2 1 Round Slow None   Left PERRL 2 1 Round Slow None         Visual Fields (Counting fingers)       Left Right    Full Full         Extraocular Movement       Right Left    Full, Ortho Full, Ortho         Neuro/Psych     Oriented x3: Yes   Mood/Affect: Normal         Dilation     Both eyes: 1.0% Mydriacyl, 2.5% Phenylephrine @ 1:14 PM           Slit Lamp and Fundus Exam     Slit Lamp Exam       Right Left   Lids/Lashes Dermatochalasis -  upper lid Dermatochalasis - upper lid   Conjunctiva/Sclera White and quiet White  and quiet   Cornea Arcus, 1-2+ Punctate epithelial erosions, mild EBMD and corneal haze Arcus, 2-3+ Punctate epithelial erosions, EBMD, irregular epi   Anterior Chamber Deep and quiet Deep and quiet   Iris Round and dilated Round and moderately dilated to 15m   Lens PC IOL in good position with open PC, anterior capsular phimosis PC IOL in good position with open PC   Anterior Vitreous Vitreous syneresis, Posterior vitreous detachment Vitreous syneresis; PVD; vitreous condensations         Fundus Exam       Right Left   Disc Mild temporal Pallor, Peripapillary atrophy, Sharp rim, Compact mild tilt, 2+ temporal Pallor, Peripapillary atrophy, Compact, Sharp rim   C/D Ratio 0.1 0.0   Macula Flat, Blunted foveal reflex, mild Epiretinal membrane, drusen, RPE mottling and clumping, No heme or edema Flat, Blunted foveal reflex, RPE mottling and clumping, Epiretinal membrane, mild interval improvement in central cystic changes, No heme   Vessels attenuated, mild tortuosity attenuated, mild tortuosity   Periphery IT schisis cavity from 0700-0830; otherwise attached; stable improvement in [facility] exud[facility] under schisis cavities ([facility]not red and pigmenting in), exudation resolved, focal pigmented CR scar at 0630; no red heme temporal sub-retinal hemorrhage improved - now white with surrounding pigment clumping;  No SRF -- completely resolved, focal, punctate IRH - improved, shallow schisis IT quad, No heme            IMAGING AND PROCEDURES  Imaging and Procedures for _0 @  OCT, Retina - OU - Both Eyes       Right Eye Quality was good. Central Foveal Thickness: 355. Progression has been stable. Findings include no IRF, no SRF, abnormal foveal contour, retinal drusen , epiretinal membrane, macular pucker, pigment epithelial detachment (stable ERM with blunted foveal contour, bullous schisis with PEDs within IT  quad caught on widefield -- persistent).   Left Eye Quality was good. Central Foveal Thickness: 326. Progression has improved. Findings include no SRF, abnormal foveal contour, retinal drusen , epiretinal membrane, intraretinal fluid, preretinal fibrosis (Mild interval improvement in central cystic changes/edema -- just trace cystic changes remain, persistent ERM with PRF).   Notes *Images captured and stored on drive  Diagnosis / Impression:  OD: stable ERM with blunted foveal contour, bullous schisis with PEDs within IT quad caught on widefield -- persistent OS: Mild interval improvement in central cystic changes/edema -- just trace cystic changes remain, persistent ERM with PRF  Clinical management:  See below  Abbreviations: NFP - Normal foveal profile. CME - cystoid macular edema. PED - pigment epithelial detachment. IRF - intraretinal fluid. SRF - subretinal fluid. EZ - ellipsoid zone. ERM - epiretinal membrane. ORA - outer retinal atrophy. ORT - outer retinal tubulation. SRHM - subretinal hyper-reflective material      Color Fundus Photography Optos - OU - Both Eyes       Right Eye Progression has been stable. Disc findings include normal observations. Macula : retinal pigment epithelium abnormalities. Vessels : tortuous vessels, attenuated. Periphery : exudates, hemorrhage, RPE abnormality (Peripheral subretinal hemes IT periphery -- improved, surrounding exudates improved).   Left Eye Progression has been stable. Disc findings include normal observations. Macula : normal observations. Vessels : attenuated. Periphery : hemorrhage, RPE abnormality (Stable improvement in sub retinal hemorrhage temporally (0200) -- SRH now white and pigment clumping surrounding).   Notes **Images stored on drive**  Impression: OD: IT schisis -- Peripheral subretinal hemes IT periphery -- improved, surrounding exudates improved -- PECHR OS: PEHCR  with stably improved sub retinal hemorrhage  temporally (0200)       Intravitreal Injection, Pharmacologic Agent - OS - Left Eye       Time Out 12/22/2021. 2:01 PM. Confirmed correct patient, procedure, site, and patient consented.   Anesthesia Topical anesthesia was used. Anesthetic medications included Lidocaine 2%, Proparacaine 0.5%.   Procedure Preparation included 5% betadine to ocular surface, eyelid speculum. A (32g) needle was used.   Injection: 2 mg aflibercept 2 MG/0.05ML   Route: Intravitreal, Site: Left Eye   NDC: [id number], Lot: [id number], Expiration date: 03/03/2023, Waste: 0 mL   Post-op Post injection exam found visual acuity of at least counting fingers. The patient tolerated the procedure well. There were no complications. The patient received written and verbal post procedure care education. Post injection medications were not given.            ASSESSMENT/PLAN:    ICD-10-CM   1. Exudative age-related macular degeneration of both eyes with active choroidal neovascularization (HCC)  H35.3231 OCT, Retina - OU - Both Eyes    Intravitreal Injection, Pharmacologic Agent - OS - Left Eye    aflibercept (EYLEA) SOLN 2 mg    2. Subretinal hemorrhage of both eyes  H35.63 Color Fundus Photography Optos - OU - Both Eyes    3. Serous retinal detachment of left eye  H33.22     4. 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 Essential hypertension  I10     7. Hypertensive retinopathy of both eyes  H35.033     8. Pseudophakia of both eyes  Z96.1      1-3. Exudative age related macular degeneration / PECHR OU  - interval conversion to exu ARMD OD noted on 11.18.22 exam -- new SRH and exudates IT periphery  - initially presented with symptomatic floaters OS  - initial exam showed large peripheral temporal subretinal hemorrhage OS -- spanning 2-430 -- also mild vitreous opacities and condensations that were causing symptomatic floaters  - B-scan (9.18.20)  showed hyperreflective, ill-defined mass, 0300 periphery OS  - was seen by Dr. Daralene Milch, Ocular Oncologist, on 9.22.2020 -- no tumor on exam or b-scan  - s/p IVA OS #1 (08.05.20) for subretinal hemorrhage, peripheral CNV  - s/p IVA OD #1 (11.18.22), #2 (12.16.22), #3 (01.13.23), #4 (02.10.23), #5 (03.24.23)  - s/p IVE OS #1 (09.02.20) - sample, #2 (10.12.20), #3 (11.11.20), #4 (12.09.20), #5 (1.20.21), #6 (02.17.21), #7 (03.19.21), #8 (04.30.21), #9 (06.16.21), #10 (08.11.21), #11 (12.01.21), #12 (01.26.22), #13 (03.30.22), #14 (06.01.22), #15 (08.10.22), #16 (09.23.22), #17 (11.18.22), #18 (02.10.23), #19 (03.24.23), #20 (05.08.23), #21 (06.21.23), #22 (08.16.23), #23 (10.25.23)  - s/p IVE OD #1 (05.08.23), #2 (06.21.23), #3 (08.16.23)  - today BCVA 20/25 OD - stable; OS 20/40 -- stable  - exam shows improved peripheral SRH OD and stable improvement in central edema OS  - OCT shows OD: stable ERM with blunted foveal contour, bullous schisis with PEDs within IT quad caught on widefield -- persistent; OS: Mild interval improvement in central cystic changes/edema -- just trace cystic changes remain, persistent ERM with PRF at 8 weeks  - continue Prolensa and PF BID OS for possible CME component  - recommend IVE OS #24 today,12.20.23 w/ f/u at 8 wks  - will hold OD today  - pt in agreement  - RBA of procedure discussed, questions answered   - see procedure note  - IVE OU informed  consent obtained and signed, 05.08.23  - [employer]yle[employer] Benefits investigation initiated 09.02.2020 -- approved for 2023  - f/u 8 weeks, sooner prn, OCT/DFE/OPTOS COLORS OU, possible injections  4. Epiretinal membrane, OU.  - mild ERM OU  - asymptomatic, no metamorphopsia  - no indication for surgery at this time  - monitor  5. Retinoschisis OD -- stable  - bullous schisis cavity, IT quadrant, from 0700-0830, -- ?less posterior extension  - no associated RT/RD but now with [facility] and exudates -- improving   - discussed  findings and prognosis  - monitor  6,7. Hypertensive retinopathy OU  - discussed importance of tight BP control  - monitor  8. Pseudophakia OU  - s/p CE/IOL OU  - beautiful surgery, doing well  - monitor  Ophthalmic Meds Ordered this visit:  Meds ordered this encounter  Medications   aflibercept (EYLEA) SOLN 2 mg     Return in about 8 weeks (around 02/16/2022) for f/u exu ARMD OU, DFE, OCT.  There are no Patient Instructions on file for this visit.   Explained the diagnoses, plan, and follow up with the patient and they expressed understanding.  Patient expressed understanding of the importance of proper follow up care.  This document serves as a record of services personally performed by Gardiner Sleeper, MD, PhD. It was created on their behalf by Orvan Falconer, an ophthalmic technician. The creation of this record is the provider's dictation and/or activities during the visit.    Electronically signed by: Orvan Falconer, OA, 12/24/21  1:23 AM  This document serves as a record of services personally performed by Gardiner Sleeper, MD, PhD. It was created on their behalf by San Jetty. Owens Shark, OA an ophthalmic technician. The creation of this record is the provider's dictation and/or activities during the visit.    Electronically signed by: San Jetty. Owens Shark, [place] 12.20.2023 1:23 AM  Gardiner Sleeper, M.D., Ph.D. Diseases & Surgery of the Retina and Vitreous [facility]  I have reviewed the above documentation for accuracy and completeness, and I agree with the above. Gardiner Sleeper, M.D., Ph.D. 12/24/21 1:26 AM  Abbreviations: M myopia (nearsighted); A astigmatism; H hyperopia (farsighted); P presbyopia; Mrx spectacle prescription;  CTL contact lenses; OD right eye; OS left eye; OU both eyes  XT exotropia; ET esotropia; PEK punctate epithelial keratitis; PEE punctate epithelial erosions; DES dry eye syndrome; MGD meibomian gland dysfunction; ATs artificial  tears; PFAT's preservative free artificial tears; [place]ole nuclear sclerotic cataract; PSC posterior subcapsular cataract; ERM epi-retinal membrane; PVD posterior vitreous detachment; RD retinal detachment; DM diabetes mellitus; DR diabetic retinopathy; NPDR non-proliferative diabetic retinopathy; PDR proliferative diabetic retinopathy; CSME clinically significant macular edema; DME diabetic macular edema; dbh dot blot hemorrhages; CWS cotton wool spot; POAG primary open angle glaucoma; C/D cup-to-disc ratio; HVF humphrey visual field; GVF goldmann visual field; OCT optical coherence tomography; IOP intraocular pressure; BRVO Branch retinal vein occlusion; CRVO central retinal vein occlusion; CRAO central retinal artery occlusion; BRAO branch retinal artery occlusion; RT retinal tear; SB scleral buckle; PPV pars plana vitrectomy; VH Vitreous hemorrhage; PRP panretinal laser photocoagulation; IVK intravitreal kenalog; VMT vitreomacular traction; MH Macular hole;  NVD neovascularization of the disc; NVE neovascularization elsewhere; AREDS age related eye disease study; ARMD age related macular degeneration; POAG primary open angle glaucoma; EBMD epithelial/anterior basement membrane dystrophy; ACIOL anterior chamber intraocular lens; IOL intraocular lens; PCIOL posterior chamber intraocular lens; Phaco/IOL phacoemulsification with intraocular lens placement; PRK photorefractive keratectomy; LASIK laser assisted in  situ keratomileusis; HTN hypertension; DM diabetes mellitus; COPD chronic obstructive pulmonary disease 

## 2021-12-22 ENCOUNTER — Encounter (INDEPENDENT_AMBULATORY_CARE_PROVIDER_SITE_OTHER): Payer: Self-pay | Admitting: Ophthalmology

## 2021-12-22 ENCOUNTER — Ambulatory Visit (INDEPENDENT_AMBULATORY_CARE_PROVIDER_SITE_OTHER): Payer: Medicare Other | Admitting: Ophthalmology

## 2021-12-22 DIAGNOSIS — H353231 Exudative age-related macular degeneration, bilateral, with active choroidal neovascularization: Secondary | ICD-10-CM | POA: Diagnosis not present

## 2021-12-22 DIAGNOSIS — H3563 Retinal hemorrhage, bilateral: Secondary | ICD-10-CM | POA: Diagnosis not present

## 2021-12-22 DIAGNOSIS — Z961 Presence of intraocular lens: Secondary | ICD-10-CM

## 2021-12-22 DIAGNOSIS — H3322 Serous retinal detachment, left eye: Secondary | ICD-10-CM | POA: Diagnosis not present

## 2021-12-22 DIAGNOSIS — I10 Essential (primary) hypertension: Secondary | ICD-10-CM

## 2021-12-22 DIAGNOSIS — H33101 Unspecified retinoschisis, right eye: Secondary | ICD-10-CM

## 2021-12-22 DIAGNOSIS — H35373 Puckering of macula, bilateral: Secondary | ICD-10-CM | POA: Diagnosis not present

## 2021-12-22 DIAGNOSIS — H35033 Hypertensive retinopathy, bilateral: Secondary | ICD-10-CM

## 2021-12-22 MED ORDER — AFLIBERCEPT 2MG/0.05ML IZ SOLN FOR KALEIDOSCOPE
2.0000 mg | INTRAVITREAL | Status: AC | PRN
  Administered 2021-12-22: 2 mg via INTRAVITREAL

## 2021-12-23 ENCOUNTER — Encounter (HOSPITAL_COMMUNITY): Payer: Self-pay

## 2021-12-23 ENCOUNTER — Inpatient Hospital Stay (HOSPITAL_COMMUNITY)
Admission: EM | Admit: 2021-12-23 | Discharge: 2021-12-25 | DRG: 193 | Disposition: A | Discharge status: Home Health | Payer: Medicare Other | Attending: Internal Medicine | Admitting: Internal Medicine

## 2021-12-23 ENCOUNTER — Other Ambulatory Visit: Payer: Self-pay

## 2021-12-23 ENCOUNTER — Emergency Department (HOSPITAL_COMMUNITY): Payer: Medicare Other

## 2021-12-23 DIAGNOSIS — D631 Anemia in chronic kidney disease: Secondary | ICD-10-CM | POA: Diagnosis present

## 2021-12-23 DIAGNOSIS — Z8249 Family history of ischemic heart disease and other diseases of the circulatory system: Secondary | ICD-10-CM | POA: Diagnosis not present

## 2021-12-23 DIAGNOSIS — G928 Other toxic encephalopathy: Secondary | ICD-10-CM | POA: Diagnosis present

## 2021-12-23 DIAGNOSIS — Z9841 Cataract extraction status, right eye: Secondary | ICD-10-CM | POA: Diagnosis not present

## 2021-12-23 DIAGNOSIS — E039 Hypothyroidism, unspecified: Secondary | ICD-10-CM | POA: Diagnosis present

## 2021-12-23 DIAGNOSIS — R413 Other amnesia: Secondary | ICD-10-CM | POA: Diagnosis present

## 2021-12-23 DIAGNOSIS — G4700 Insomnia, unspecified: Secondary | ICD-10-CM | POA: Diagnosis present

## 2021-12-23 DIAGNOSIS — Z66 Do not resuscitate: Secondary | ICD-10-CM | POA: Diagnosis present

## 2021-12-23 DIAGNOSIS — I129 Hypertensive chronic kidney disease with stage 1 through stage 4 chronic kidney disease, or unspecified chronic kidney disease: Secondary | ICD-10-CM | POA: Diagnosis present

## 2021-12-23 DIAGNOSIS — I10 Essential (primary) hypertension: Secondary | ICD-10-CM | POA: Diagnosis present

## 2021-12-23 DIAGNOSIS — J189 Pneumonia, unspecified organism: Secondary | ICD-10-CM | POA: Diagnosis not present

## 2021-12-23 DIAGNOSIS — Z1152 Encounter for screening for COVID-19: Secondary | ICD-10-CM | POA: Diagnosis not present

## 2021-12-23 DIAGNOSIS — Z9842 Cataract extraction status, left eye: Secondary | ICD-10-CM | POA: Diagnosis not present

## 2021-12-23 DIAGNOSIS — N1831 Chronic kidney disease, stage 3a: Secondary | ICD-10-CM | POA: Diagnosis present

## 2021-12-23 DIAGNOSIS — Z885 Allergy status to narcotic agent status: Secondary | ICD-10-CM | POA: Diagnosis not present

## 2021-12-23 DIAGNOSIS — Z79899 Other long term (current) drug therapy: Secondary | ICD-10-CM | POA: Diagnosis not present

## 2021-12-23 DIAGNOSIS — Z79890 Hormone replacement therapy (postmenopausal): Secondary | ICD-10-CM | POA: Diagnosis not present

## 2021-12-23 DIAGNOSIS — F325 Major depressive disorder, single episode, in full remission: Secondary | ICD-10-CM | POA: Diagnosis present

## 2021-12-23 DIAGNOSIS — Z823 Family history of stroke: Secondary | ICD-10-CM

## 2021-12-23 DIAGNOSIS — F03A4 Unspecified dementia, mild, with anxiety: Secondary | ICD-10-CM | POA: Diagnosis present

## 2021-12-23 DIAGNOSIS — R251 Tremor, unspecified: Secondary | ICD-10-CM | POA: Diagnosis present

## 2021-12-23 DIAGNOSIS — E860 Dehydration: Secondary | ICD-10-CM | POA: Diagnosis present

## 2021-12-23 DIAGNOSIS — E785 Hyperlipidemia, unspecified: Secondary | ICD-10-CM | POA: Diagnosis present

## 2021-12-23 DIAGNOSIS — G929 Unspecified toxic encephalopathy: Secondary | ICD-10-CM

## 2021-12-23 DIAGNOSIS — Z87891 Personal history of nicotine dependence: Secondary | ICD-10-CM | POA: Diagnosis not present

## 2021-12-23 DIAGNOSIS — K219 Gastro-esophageal reflux disease without esophagitis: Secondary | ICD-10-CM | POA: Diagnosis present

## 2021-12-23 LAB — CBC WITH DIFFERENTIAL/PLATELET
Abs Immature Granulocytes: 0.04 10*3/uL (ref 0.00–0.07)
Basophils Absolute: 0 10*3/uL (ref 0.0–0.1)
Basophils Relative: 0 %
Eosinophils Absolute: 0 10*3/uL (ref 0.0–0.5)
Eosinophils Relative: 0 %
HCT: 33.9 % — ABNORMAL LOW (ref 36.0–46.0)
Hemoglobin: 11.5 g/dL — ABNORMAL LOW (ref 12.0–15.0)
Immature Granulocytes: 0 %
Lymphocytes Relative: 3 %
Lymphs Abs: 0.3 10*3/uL — ABNORMAL LOW (ref 0.7–4.0)
MCH: 29.7 pg (ref 26.0–34.0)
MCHC: 33.9 g/dL (ref 30.0–36.0)
MCV: 87.6 fL (ref 80.0–100.0)
Monocytes Absolute: 0.6 10*3/uL (ref 0.1–1.0)
Monocytes Relative: 6 %
Neutro Abs: 9.5 10*3/uL — ABNORMAL HIGH (ref 1.7–7.7)
Neutrophils Relative %: 91 %
Platelets: 187 10*3/uL (ref 150–400)
RBC: 3.87 MIL/uL (ref 3.87–5.11)
RDW: 13.4 % (ref 11.5–15.5)
WBC: 10.4 10*3/uL (ref 4.0–10.5)
nRBC: 0 % (ref 0.0–0.2)

## 2021-12-23 LAB — CREATININE, SERUM
Creatinine, Ser: 1.15 mg/dL — ABNORMAL HIGH (ref 0.44–1.00)
GFR, Estimated: 44 mL/min — ABNORMAL LOW (ref >60)

## 2021-12-23 LAB — RESP PANEL BY RT-PCR (RSV, FLU A&B, COVID)  RVPGX2
Influenza A by PCR: NEGATIVE
Influenza B by PCR: NEGATIVE
Resp Syncytial Virus by PCR: NEGATIVE
SARS Coronavirus 2 by RT PCR: NEGATIVE

## 2021-12-23 LAB — COMPREHENSIVE METABOLIC PANEL
ALT: 15 U/L (ref 0–44)
AST: 31 U/L (ref 15–41)
Albumin: 3.4 g/dL — ABNORMAL LOW (ref 3.5–5.0)
Alkaline Phosphatase: 34 U/L — ABNORMAL LOW (ref 38–126)
Anion gap: 9 (ref 5–15)
BUN: 23 mg/dL (ref 8–23)
CO2: 23 mmol/L (ref 22–32)
Calcium: 9.1 mg/dL (ref 8.9–10.3)
Chloride: 104 mmol/L (ref 98–111)
Creatinine, Ser: 1.07 mg/dL — ABNORMAL HIGH (ref 0.44–1.00)
GFR, Estimated: 48 mL/min — ABNORMAL LOW (ref >60)
Glucose, Bld: 128 mg/dL — ABNORMAL HIGH (ref 70–99)
Potassium: 4 mmol/L (ref 3.5–5.1)
Sodium: 136 mmol/L (ref 135–145)
Total Bilirubin: 0.4 mg/dL (ref 0.3–1.2)
Total Protein: 5.9 g/dL — ABNORMAL LOW (ref 6.5–8.1)

## 2021-12-23 LAB — LACTIC ACID, PLASMA
Lactic Acid, Venous: 2.7 mmol/L (ref 0.5–1.9)
Lactic Acid, Venous: 5 mmol/L (ref 0.5–1.9)

## 2021-12-23 LAB — CBG MONITORING, ED: Glucose-Capillary: 138 mg/dL — ABNORMAL HIGH (ref 70–99)

## 2021-12-23 LAB — CBC
HCT: 36.9 % (ref 36.0–46.0)
Hemoglobin: 12 g/dL (ref 12.0–15.0)
MCH: 29.3 pg (ref 26.0–34.0)
MCHC: 32.5 g/dL (ref 30.0–36.0)
MCV: 90 fL (ref 80.0–100.0)
Platelets: 212 10*3/uL (ref 150–400)
RBC: 4.1 MIL/uL (ref 3.87–5.11)
RDW: 13.6 % (ref 11.5–15.5)
WBC: 15.8 10*3/uL — ABNORMAL HIGH (ref 4.0–10.5)
nRBC: 0 % (ref 0.0–0.2)

## 2021-12-23 LAB — PROCALCITONIN: Procalcitonin: 3.69 ng/mL

## 2021-12-23 LAB — PROTIME-INR
INR: 1.1 (ref 0.8–1.2)
Prothrombin Time: 14.2 seconds (ref 11.4–15.2)

## 2021-12-23 LAB — APTT: aPTT: 30 seconds (ref 24–36)

## 2021-12-23 LAB — TSH: TSH: 1.254 u[IU]/mL (ref 0.350–4.500)

## 2021-12-23 MED ORDER — POLYETHYLENE GLYCOL 3350 17 G PO PACK: 17.0000 g | PACK | Freq: Every day | ORAL | Status: DC | PRN

## 2021-12-23 MED ORDER — LACTATED RINGERS IV BOLUS
500.0000 mL | Freq: Once | INTRAVENOUS | Status: AC
  Administered 2021-12-23: 500 mL via INTRAVENOUS

## 2021-12-23 MED ORDER — CITALOPRAM HYDROBROMIDE 20 MG PO TABS
20.0000 mg | ORAL_TABLET | Freq: Every day | ORAL | Status: DC
  Administered 2021-12-23 – 2021-12-25 (×3): 20 mg via ORAL
  Filled 2021-12-23: qty 2
  Filled 2021-12-23 (×2): qty 1

## 2021-12-23 MED ORDER — ASPIRIN 81 MG PO TBEC
81.0000 mg | DELAYED_RELEASE_TABLET | Freq: Every day | ORAL | Status: DC
  Administered 2021-12-23 – 2021-12-25 (×3): 81 mg via ORAL
  Filled 2021-12-23 (×3): qty 1

## 2021-12-23 MED ORDER — LACTATED RINGERS IV SOLN: INTRAVENOUS | Status: DC

## 2021-12-23 MED ORDER — MEMANTINE HCL 10 MG PO TABS
5.0000 mg | ORAL_TABLET | Freq: Every day | ORAL | Status: DC
  Administered 2021-12-23 – 2021-12-25 (×3): 5 mg via ORAL
  Filled 2021-12-23 (×3): qty 1

## 2021-12-23 MED ORDER — SODIUM CHLORIDE 0.9% FLUSH
3.0000 mL | Freq: Two times a day (BID) | INTRAVENOUS | Status: DC
  Administered 2021-12-24 – 2021-12-25 (×3): 3 mL via INTRAVENOUS

## 2021-12-23 MED ORDER — LEVOTHYROXINE SODIUM 25 MCG PO TABS: 50.0000 ug | ORAL_TABLET | Freq: Every day | ORAL | Status: DC

## 2021-12-23 MED ORDER — ENOXAPARIN SODIUM 30 MG/0.3ML IJ SOSY
30.0000 mg | PREFILLED_SYRINGE | INTRAMUSCULAR | Status: DC
  Administered 2021-12-23 – 2021-12-24 (×2): 30 mg via SUBCUTANEOUS
  Filled 2021-12-23 (×2): qty 0.3

## 2021-12-23 MED ORDER — ONDANSETRON HCL 4 MG/2ML IJ SOLN: 4.0000 mg | Freq: Four times a day (QID) | INTRAMUSCULAR | Status: DC | PRN

## 2021-12-23 MED ORDER — PRAVASTATIN SODIUM 10 MG PO TABS
10.0000 mg | ORAL_TABLET | Freq: Every day | ORAL | Status: DC
  Administered 2021-12-24 – 2021-12-25 (×2): 10 mg via ORAL
  Filled 2021-12-23 (×2): qty 1

## 2021-12-23 MED ORDER — LEVOTHYROXINE SODIUM 75 MCG PO TABS
75.0000 ug | ORAL_TABLET | Freq: Every day | ORAL | Status: DC
  Administered 2021-12-24 – 2021-12-25 (×2): 75 ug via ORAL
  Filled 2021-12-23 (×3): qty 1

## 2021-12-23 MED ORDER — ACETAMINOPHEN 325 MG PO TABS
325.0000 mg | ORAL_TABLET | Freq: Once | ORAL | Status: AC
  Administered 2021-12-23: 325 mg via ORAL
  Filled 2021-12-23: qty 1

## 2021-12-23 MED ORDER — CYCLOSPORINE 0.05 % OP EMUL
1.0000 [drp] | Freq: Two times a day (BID) | OPHTHALMIC | Status: DC
  Filled 2021-12-23 (×4): qty 30

## 2021-12-23 MED ORDER — DONEPEZIL HCL 10 MG PO TABS
10.0000 mg | ORAL_TABLET | Freq: Every day | ORAL | Status: DC
  Administered 2021-12-23 – 2021-12-25 (×3): 10 mg via ORAL
  Filled 2021-12-23 (×3): qty 1

## 2021-12-23 MED ORDER — ACETAMINOPHEN 325 MG PO TABS: 650.0000 mg | ORAL_TABLET | Freq: Four times a day (QID) | ORAL | Status: DC | PRN

## 2021-12-23 MED ORDER — ONDANSETRON HCL 4 MG PO TABS: 4.0000 mg | ORAL_TABLET | Freq: Four times a day (QID) | ORAL | Status: DC | PRN

## 2021-12-23 MED ORDER — OXYBUTYNIN CHLORIDE ER 5 MG PO TB24
5.0000 mg | ORAL_TABLET | Freq: Every day | ORAL | Status: DC
  Administered 2021-12-23 – 2021-12-25 (×3): 5 mg via ORAL
  Filled 2021-12-23 (×3): qty 1

## 2021-12-23 MED ORDER — ACETAMINOPHEN 650 MG RE SUPP: 650.0000 mg | Freq: Four times a day (QID) | RECTAL | Status: DC | PRN

## 2021-12-23 MED ORDER — AZITHROMYCIN 500 MG PO TABS
500.0000 mg | ORAL_TABLET | Freq: Every day | ORAL | Status: DC
  Administered 2021-12-24 – 2021-12-25 (×2): 500 mg via ORAL
  Filled 2021-12-23 (×2): qty 1

## 2021-12-23 MED ORDER — SODIUM CHLORIDE 0.9 % IV SOLN
500.0000 mg | Freq: Once | INTRAVENOUS | Status: AC
  Administered 2021-12-23: 500 mg via INTRAVENOUS
  Filled 2021-12-23: qty 5

## 2021-12-23 MED ORDER — VITAMIN D 25 MCG (1000 UNIT) PO TABS
5000.0000 [IU] | ORAL_TABLET | Freq: Every day | ORAL | Status: DC
  Administered 2021-12-23 – 2021-12-25 (×3): 5000 [IU] via ORAL
  Filled 2021-12-23 (×3): qty 5

## 2021-12-23 MED ORDER — HYDRALAZINE HCL 20 MG/ML IJ SOLN: 10.0000 mg | Freq: Four times a day (QID) | INTRAMUSCULAR | Status: DC | PRN

## 2021-12-23 MED ORDER — SODIUM CHLORIDE 0.9 % IV SOLN
3.0000 g | Freq: Four times a day (QID) | INTRAVENOUS | Status: DC
  Administered 2021-12-23 – 2021-12-25 (×8): 3 g via INTRAVENOUS
  Filled 2021-12-23 (×8): qty 8

## 2021-12-23 MED ORDER — RISAQUAD PO CAPS
1.0000 | ORAL_CAPSULE | Freq: Every day | ORAL | Status: DC
  Administered 2021-12-24 – 2021-12-25 (×2): 1 via ORAL
  Filled 2021-12-23 (×3): qty 1

## 2021-12-23 MED ORDER — SODIUM CHLORIDE 0.9 % IV SOLN
1.0000 g | Freq: Once | INTRAVENOUS | Status: AC
  Administered 2021-12-23: 1 g via INTRAVENOUS
  Filled 2021-12-23: qty 10

## 2021-12-23 MED ORDER — PANTOPRAZOLE SODIUM 40 MG PO TBEC
40.0000 mg | DELAYED_RELEASE_TABLET | Freq: Every day | ORAL | Status: DC
  Administered 2021-12-23 – 2021-12-25 (×3): 40 mg via ORAL
  Filled 2021-12-23 (×3): qty 1

## 2021-12-23 NOTE — ED Triage Notes (Signed)
Pt arrived to ED via EMS from home w/ c/o fever, generalized weakness, fatigue. Pt normally ambulatory, but unable to get out of bed this morning. Recent diagnosis of dementia, pt A&Ox4 w/ EMS. EMS reports tremors are baseline. Pt's family reports LKW 1800 yesterday and that pt has slurred speech, but pt says she has a dry mouth.   EMS VS: BP 138/50, HR 76, RR 24, 98% RA, CBG 140, Cap 38, T 100.4  EMS interventions: 20g L AC, 650mg  tylenol, NS bolus

## 2021-12-23 NOTE — Progress Notes (Signed)
Pharmacy Antibiotic Note  Tracey Rosario is a 88 y.o. female admitted on 12/23/2021 with  aspiration pneumonia .  Pharmacy has been consulted for Unasyn dosing.  Plan: Start Unasyn 3g Q6H.  Follow culture data for de-escalation.  Monitor renal function for dose adjustments as indicated.    Height: 5\' 11"  (180.3 cm) Weight: 60.8 kg (134 lb) IBW/kg (Calculated) : 70.8  Temp (24hrs), Avg:100.4 F (38 C), Min:98.4 F (36.9 C), Max:102.3 F (39.1 C)  Recent Labs  Lab 12/23/21 1153 12/23/21 1225  WBC 10.4  --   CREATININE 1.07*  --   LATICACIDVEN  --  2.7*    Estimated Creatinine Clearance: 31.5 mL/min (A) (by C-G formula based on SCr of 1.07 mg/dL (H)).    Allergies  Allergen Reactions   Codeine Other (See Comments)    Altered mental status    Thank you for allowing pharmacy to be a part of this patient's care.  12/25/21, PharmD, BCCCP  12/23/2021 3:40 PM

## 2021-12-23 NOTE — ED Notes (Signed)
Pt well appearing upon transfer to floor. 

## 2021-12-23 NOTE — H&P (Signed)
TRH H&P   Patient Demographics:    Tracey Rosario, is a 88 y.o. female  MRN: [id number]   DOB - Aug 13, 1926  Admit Date - 12/23/2021  Outpatient Primary MD for the patient is Courtney Paris, NP     Patient coming from: Home  Chief Complaint  Patient presents with   Flu like symptoms      HPI:    Tracey Rosario  is a 88 y.o. female, with mild dementia, depression and anxiety, GERD, anemia of chronic disease, sinus bradycardia due to beta-blocker, who lives at home with close supervision by her daughter who lives close to her was brought in by EMS after she was found confused and febrile at home.  She was brought to the ER where she was diagnosed with toxic encephalopathy due to community-acquired pneumonia and I was called to admit the patient.    Surprisingly besides mild confusion and fatigue patient is relatively symptom-free, she denies any headache, no chest or abdominal pain no shortness of breath no dysuria no blood in stool or urine and no focal weakness.    Review of systems:       A full 10 point Review of Systems was done, except as stated above, all other Review of Systems were negative.   With Past History of the following :    Past Medical History:  Diagnosis Date   Acid reflux    Anemia    Arthropathy    Bradycardia    beta blocker reduced in 2021 due to this   CKD (chronic kidney disease), stage III (HCC)    Cognitive decline    referred to neurologist   Depression 10/07/2013   daughter didn't know anything about this   Dizziness    Frequent PVCs    GERD (gastroesophageal reflux disease) 05/04/2013   Hip fx (HCC)    r hip, bilat pubis rami   Hyperlipemia    Hypertension     Hypertensive retinopathy    OU   Hypothyroidism    Insomnia    Macular degeneration    Exu OS   Multilevel degenerative disc disease    Palpitations 05/04/2013   saw cardiologist in 2015, he deleted A. Fib from the record as per note not documentation to support   Premature atrial contractions    PSVT (paroxysmal supraventricular tachycardia)  a. short runs by monitor in 2021.   Short-term memory loss    Vitamin D deficiency       Past Surgical History:  Procedure Laterality Date   ABDOMINAL HYSTERECTOMY  1971   BLADDER SURGERY  2010   Broken Pelvis  2015   C-EYE SURGERY PROCEDURE Bilateral    zaldivar   CATARACT EXTRACTION Bilateral    Done in [place]   EYE SURGERY Bilateral    Cat Sx   HIP PINNING,CANNULATED Right 10/07/2013   Procedure: CANNULATED screws right hip;  Surgeon: Kathryne Hitch, MD;  Location: [facility] OR;  Service: Orthopedics;  Laterality: Right;      Social History:     Social History   Tobacco Use   Smoking status: Former    Types: Cigarettes    Quit date: 08/02/1970    Years since quitting: 51.4   Smokeless tobacco: Never   Tobacco comments:    "smoked very little"  Substance Use Topics   Alcohol use: Never    Alcohol/week: 0.0 standard drinks of alcohol         Family History :     Family History  Problem Relation Age of Onset   Prostate cancer Father    Heart attack Sister    Cancer Brother    Cancer Sister    Heart attack Sister    Cancer Sister    Stroke Daughter    Dementia Neg Hx    Alzheimer's disease Neg Hx         Home Medications:   Prior to Admission medications   Medication Sig Start Date End Date Taking? Authorizing Provider  amLODipine (NORVASC) 10 MG tablet Take 10 mg by mouth daily. 12/05/19  Yes [provider]  aspirin EC 81 MG tablet Take 1 tablet (81 mg total) by mouth daily. Patient taking differently: Take 81 mg by mouth at bedtime. 06/06/19  Yes Dunn, Dayna N, PA-C  Bromfenac Sodium  (PROLENSA) 0.07 % SOLN Place 1 drop into the left eye 3 (three) times daily. Patient taking differently: Place 1 drop into the left eye in the morning and at bedtime. 06/23/21  Yes Rennis Chris, MD  Cholecalciferol (VITAMIN D3) 125 MCG (5000 UT) TABS Take 5,000 Units by mouth daily.    Yes [provider]  citalopram (CELEXA) 20 MG tablet Take 20 mg by mouth daily. 04/29/19  Yes [provider]  donepezil (ARICEPT) 23 MG TABS tablet Take 23 mg by mouth daily. 11/22/21  Yes [provider]  ferrous gluconate (FERGON) 324 MG tablet Take 324 mg by mouth daily. 04/29/19  Yes [provider]  gabapentin (NEURONTIN) 300 MG capsule Take 300 mg by mouth 3 (three) times daily. 06/05/20  Yes [provider]  levothyroxine (SYNTHROID) 75 MCG tablet Take 75 mcg by mouth daily. 03/04/20  Yes [provider]  lisinopril (ZESTRIL) 10 MG tablet Take 10 mg by mouth daily. 12/02/20  Yes [provider]  memantine (NAMENDA) 5 MG tablet TAKE 1 TABLET BY MOUTH EVERYDAY AT BEDTIME Patient taking differently: Take 5 mg by mouth daily. 12/10/21  Yes Marcos Eke, PA-C  omeprazole (PRILOSEC) 20 MG capsule TAKE 1 CAPSULE DAILY Patient taking differently: Take 20 mg by mouth daily. 10/20/17  Yes Kriste Basque R, DO  oxybutynin (DITROPAN-XL) 10 MG 24 hr tablet Take 10 mg by mouth daily. 10/25/21  Yes [provider]  oxybutynin (DITROPAN-XL) 5 MG 24 hr tablet Take 5 mg by mouth daily. 12/14/20  Yes [provider]  polyvinyl alcohol (LIQUIFILM TEARS) 1.4 % ophthalmic solution Place 1 drop into both eyes as needed for dry eyes.   Yes [provider]  prednisoLONE acetate (PRED FORTE) 1 % ophthalmic suspension Place 1 drop into the left eye 3 (three) times daily. Patient taking differently: Place 1 drop into the left eye in the morning and at bedtime. 06/23/21  Yes Rennis ChrisZamora, Brian, MD  lisinopril (PRINIVIL,ZESTRIL) 30 MG tablet TAKE 1 TABLET DAILY  (NEED AN APPOINTMENT) Patient not taking: Reported on 12/23/2021 03/09/18   Terressa KoyanagiKim, Hannah R, DO     Allergies:     Allergies  Allergen Reactions   Codeine Other (See Comments)    Altered mental status     Physical Exam:   Vitals  Blood pressure (!) 111/44, pulse 62, temperature 98.4 F (36.9 C), temperature source Oral, resp. rate (!) 22, height 5\' 11"  (1.803 m), weight 60.8 kg, SpO2 95 %.   1. General elderly Caucasian female lying in hospital bed appears slightly tired, sleeping but easily arousable,  2. Normal affect and insight, Not Suicidal or Homicidal, Awake Alert,   3. No F.N deficits, ALL C.Nerves Intact, Strength 5/5 all 4 extremities, Sensation intact all 4 extremities, Plantars down going.  4. Ears and Eyes appear Normal, Conjunctivae clear, PERRLA. Moist Oral Mucosa.  5. Supple Neck, No JVD, No cervical lymphadenopathy appriciated, No Carotid Bruits.  6. Symmetrical Chest wall movement, Good air movement bilaterally, CTAB.  7. RRR, No Gallops, Rubs or Murmurs, No Parasternal Heave.  8. Positive Bowel Sounds, Abdomen Soft, No tenderness, No organomegaly appriciated,No rebound -guarding or rigidity.  9.  No Cyanosis, Normal Skin Turgor, No Skin Rash or Bruise.  10. Good muscle tone,  joints appear normal , no effusions, Normal ROM.  11. No Palpable Lymph Nodes in Neck or Axillae      Data Review:   Recent Labs  Lab 12/23/21 1153  WBC 10.4  HGB 11.5*  HCT 33.9*  PLT 187  MCV 87.6  MCH 29.7  MCHC 33.9  RDW 13.4  LYMPHSABS 0.3*  MONOABS 0.6  EOSABS 0.0  BASOSABS 0.0    Recent Labs  Lab 12/23/21 1153 12/23/21 1225  NA 136  --   K 4.0  --   CL 104  --   CO2 23  --   ANIONGAP 9  --   GLUCOSE 128*  --   BUN 23  --   CREATININE 1.07*  --   AST 31  --   ALT 15  --   ALKPHOS 34*  --   BILITOT 0.4  --   ALBUMIN 3.4*  --   LATICACIDVEN  --  2.7*  INR 1.1  --   CALCIUM 9.1  --       Recent Labs  Lab 12/23/21 1153 12/23/21 1225   LATICACIDVEN  --  2.7*  INR 1.1  --   CALCIUM 9.1  --     Recent Labs  Lab 12/23/21 1153 12/23/21 1225  WBC 10.4  --   PLT 187  --   LATICACIDVEN  --  2.7*  CREATININE 1.07*  --     Urinalysis    Component Value Date/Time   COLORURINE YELLOW 04/24/2019 1627   APPEARANCEUR HAZY (A) 04/24/2019 1627   LABSPEC 1.021 04/24/2019 1627   PHURINE 5.0 04/24/2019 1627   GLUCOSEU NEGATIVE 04/24/2019 1627   HGBUR NEGATIVE 04/24/2019 1627   BILIRUBINUR NEGATIVE 04/24/2019 1627   BILIRUBINUR 1+ 07/29/2013 1138  KETONESUR NEGATIVE 04/24/2019 1627   PROTEINUR 30 (A) 04/24/2019 1627   UROBILINOGEN 0.2 10/07/2013 1331   NITRITE NEGATIVE 04/24/2019 1627   LEUKOCYTESUR NEGATIVE 04/24/2019 1627      Imaging Results:    DG Chest 2 View  Result Date: 12/23/2021 CLINICAL DATA:  Fever EXAM: CHEST - 2 VIEW COMPARISON:  04/24/19 CXR FINDINGS: No pleural effusion. No pneumothorax. Redemonstrated are upper lung predominant pleural calcifications, not significantly changed compared to prior exam. Compared to prior exam there is a new focal consolidation in the right upper lung. Cardiac and mediastinal contours are unchanged. Visualized upper abdomen is unremarkable. Vertebral body heights are maintained. IMPRESSION: New focal consolidation in the right upper lung, concerning for pneumonia. Recommend follow-up chest radiograph in 4-6 weeks to ensure resolution. Electronically Signed   By: Lorenza Cambridge M.D.   On: 12/23/2021 13:27   My personal review of EKG: Rhythm NSR, 62 bpm with no acute changes   Assessment & Plan:   Toxic encephalopathy due to community-acquired pneumonia.  Infiltrate seems to be on the right side hence ruling out aspiration will be important, no history suggestive of the same though, for now we will obtain blood cultures, trend inflammatory markers, soft diet with feeding assistance aspiration precautions, speech evaluation.  Empiric IV antibiotics.  Encouraged to sit up in  chair in the daytime use I-S and flutter valve for pulmonary toiletry.  Continue supportive care.  At risk for delirium, for now no headache or focal deficits.  Elevation of lactic acid is due to dehydration, does not appear toxic no sepsis.  2.  Early dementia and some anxiety.  Home medications continued, again at risk for delirium explained clearly to patient's daughter, minimize narcotics and benzodiazepines if she gets confused use Haldol/Seroquel.  3.  GERD.  Placed on PPI.  4.  Hypertension.  Currently appears slightly dehydrated hold Norvasc and hydrate with IV fluids.  5.  Dyslipidemia.  On statin continue.    DVT Prophylaxis Heparin   AM Labs Ordered, also please review Full Orders  Family Communication: Admission, patients condition and plan of care including tests being ordered have been discussed with the patient and daughter who indicate understanding and agree with the plan and Code Status.  Code Status DNR  Likely DC to  TBD  Condition GUARDED    Consults called: None    Admission status: Inpt    Time spent in minutes : 35  Signature  -    Susa Raring M.D on 12/23/2021 at 3:24 PM   -  To page go to [email or web address]

## 2021-12-23 NOTE — ED Provider Notes (Signed)
[facility] EMERGENCY DEPARTMENT Provider Note   CSN: [id number] Arrival date & time: 12/23/21  1138     History  Chief Complaint  Patient presents with   Flu like symptoms    Tracey Rosario is a 88 y.o. female.  HPI 88 year old female presents via EMS for altered mental status.  History is initially from EMS and later the daughter.  Patient was by herself.  Found to be confused today and so EMS was called for potential stroke.  They found her to have a fever of 100.4 and her mental status seemed okay so no code stroke was called.  They gave her 650 mg Tylenol just prior to arrival.  Patient feels generally weak and states that her mouth is dry.  Otherwise, no complaints.  Later, I talked to the daughter who arrived.  Patient lives by herself.  She has some mild dementia.  At her baseline yesterday when she last talked to her.  She called her to wake her up for her appointment today and she seemed confused.  Was still confused when she tried to talk to the camera in her house.  Now the patient seems at baseline but it appears that she has a fever.  Otherwise the patient has not appeared ill to the daughter.  Home Medications Prior to Admission medications   Medication Sig Start Date End Date Taking? Authorizing Provider  amLODipine (NORVASC) 10 MG tablet Take 10 mg by mouth daily. 12/05/19  Yes [provider]  aspirin EC 81 MG tablet Take 1 tablet (81 mg total) by mouth daily. Patient taking differently: Take 81 mg by mouth at bedtime. 06/06/19  Yes Dunn, Dayna N, PA-C  Bromfenac Sodium (PROLENSA) 0.07 % SOLN Place 1 drop into the left eye 3 (three) times daily. Patient taking differently: Place 1 drop into the left eye in the morning and at bedtime. 06/23/21  Yes Rennis Chris, MD  Cholecalciferol (VITAMIN D3) 125 MCG (5000 UT) TABS Take 5,000 Units by mouth daily.    Yes [provider]  citalopram (CELEXA) 20 MG tablet Take 20 mg by  mouth daily. 04/29/19  Yes [provider]  donepezil (ARICEPT) 23 MG TABS tablet Take 23 mg by mouth daily. 11/22/21  Yes [provider]  ferrous gluconate (FERGON) 324 MG tablet Take 324 mg by mouth daily. 04/29/19  Yes [provider]  gabapentin (NEURONTIN) 300 MG capsule Take 300 mg by mouth 3 (three) times daily. 06/05/20  Yes [provider]  levothyroxine (SYNTHROID) 75 MCG tablet Take 75 mcg by mouth daily. 03/04/20  Yes [provider]  lisinopril (ZESTRIL) 10 MG tablet Take 10 mg by mouth daily. 12/02/20  Yes [provider]  memantine (NAMENDA) 5 MG tablet TAKE 1 TABLET BY MOUTH EVERYDAY AT BEDTIME Patient taking differently: Take 5 mg by mouth daily. 12/10/21  Yes Marcos Eke, PA-C  omeprazole (PRILOSEC) 20 MG capsule TAKE 1 CAPSULE DAILY Patient taking differently: Take 20 mg by mouth daily. 10/20/17  Yes Kriste Basque R, DO  oxybutynin (DITROPAN-XL) 10 MG 24 hr tablet Take 10 mg by mouth daily. 10/25/21  Yes [provider]  oxybutynin (DITROPAN-XL) 5 MG 24 hr tablet Take 5 mg by mouth daily. 12/14/20  Yes [provider]  polyvinyl alcohol (LIQUIFILM TEARS) 1.4 % ophthalmic solution Place 1 drop into both eyes as needed for dry eyes.   Yes [provider]  prednisoLONE acetate (PRED FORTE) 1 % ophthalmic suspension Place  1 drop into the left eye 3 (three) times daily. Patient taking differently: Place 1 drop into the left eye in the morning and at bedtime. 06/23/21  Yes Rennis ChrisZamora, Brian, MD  lisinopril (PRINIVIL,ZESTRIL) 30 MG tablet TAKE 1 TABLET DAILY (NEED AN APPOINTMENT) Patient not taking: Reported on 12/23/2021 03/09/18   Terressa KoyanagiKim, Hannah R, DO      Allergies    Codeine    Review of Systems   Review of Systems  HENT:  Negative for sore throat.   Respiratory:  Negative for cough and shortness of breath.   Cardiovascular:  Negative for chest pain.  Genitourinary:  Negative for dysuria.  Neurological:   Positive for weakness. Negative for headaches.    Physical Exam Updated Vital Signs BP (!) 111/44   Pulse 62   Temp 98.4 F (36.9 C) (Oral)   Resp (!) 22   Ht 5\' 11"  (1.803 m)   Wt 60.8 kg   SpO2 95%   BMI 18.69 kg/m  Physical Exam Vitals and nursing note reviewed.  Constitutional:      Appearance: She is well-developed.  HENT:     Head: Normocephalic and atraumatic.  Eyes:     Extraocular Movements: Extraocular movements intact.  Cardiovascular:     Rate and Rhythm: Normal rate and regular rhythm.     Heart sounds: Normal heart sounds.  Pulmonary:     Effort: Pulmonary effort is normal.     Breath sounds: Normal breath sounds.  Abdominal:     Palpations: Abdomen is soft.     Tenderness: There is no abdominal tenderness.  Musculoskeletal:     Cervical back: Normal range of motion. No rigidity.  Skin:    General: Skin is warm and dry.  Neurological:     Mental Status: She is alert.     Comments: Equal strength in all 4 extremities.  No slurred speech or facial droop.  She is alert and oriented to person, place, month but is disoriented to the year and day of the week.     ED Results / Procedures / Treatments   Labs (all labs ordered are listed, but only abnormal results are displayed) Labs Reviewed  LACTIC ACID, PLASMA - Abnormal; Notable for the following components:      Result Value   Lactic Acid, Venous 2.7 (*)    All other components within normal limits  COMPREHENSIVE METABOLIC PANEL - Abnormal; Notable for the following components:   Glucose, Bld 128 (*)    Creatinine, Ser 1.07 (*)    Total Protein 5.9 (*)    Albumin 3.4 (*)    Alkaline Phosphatase 34 (*)    GFR, Estimated 48 (*)    All other components within normal limits  CBC WITH DIFFERENTIAL/PLATELET - Abnormal; Notable for the following components:   Hemoglobin 11.5 (*)    HCT 33.9 (*)    Neutro Abs 9.5 (*)    Lymphs Abs 0.3 (*)    All other components within normal limits  CBG MONITORING,  ED - Abnormal; Notable for the following components:   Glucose-Capillary 138 (*)    All other components within normal limits  RESP PANEL BY RT-PCR (RSV, FLU A&B, COVID)  RVPGX2  CULTURE, BLOOD (ROUTINE X 2)  CULTURE, BLOOD (ROUTINE X 2)  URINE CULTURE  MRSA NEXT GEN BY PCR, NASAL  PROTIME-INR  APTT  LACTIC ACID, PLASMA  URINALYSIS, ROUTINE W REFLEX MICROSCOPIC  TSH    EKG EKG Interpretation  Date/Time:  Thursday December 23 2021 12:17:21 EST Ventricular Rate:  62 PR Interval:  208 QRS Duration: 80 QT Interval:  429 QTC Calculation: 436 R Axis:   63 Text Interpretation: Sinus rhythm Abnormal R-wave progression, early transition Confirmed by Pricilla Loveless [id number]) on 12/23/2021 12:45:47 PM  Radiology DG Chest 2 View  Result Date: 12/23/2021 CLINICAL DATA:  Fever EXAM: CHEST - 2 VIEW COMPARISON:  04/24/19 CXR FINDINGS: No pleural effusion. No pneumothorax. Redemonstrated are upper lung predominant pleural calcifications, not significantly changed compared to prior exam. Compared to prior exam there is a new focal consolidation in the right upper lung. Cardiac and mediastinal contours are unchanged. Visualized upper abdomen is unremarkable. Vertebral body heights are maintained. IMPRESSION: New focal consolidation in the right upper lung, concerning for pneumonia. Recommend follow-up chest radiograph in 4-6 weeks to ensure resolution. Electronically Signed   By: Lorenza Cambridge M.D.   On: 12/23/2021 13:27   Intravitreal Injection, Pharmacologic Agent - OS - Left Eye  Result Date: 12/22/2021 Time Out 12/22/2021. 2:01 PM. Confirmed correct patient, procedure, site, and patient consented. Anesthesia Topical anesthesia was used. Anesthetic medications included Lidocaine 2%, Proparacaine 0.5%. Procedure Preparation included 5% betadine to ocular surface, eyelid speculum. A (32g) needle was used. Injection: 2 mg aflibercept 2 MG/0.05ML   Route: Intravitreal, Site: Left Eye   NDC:  L6038910, Lot: [id number], Expiration date: 03/03/2023, Waste: 0 mL Post-op Post injection exam found visual acuity of at least counting fingers. The patient tolerated the procedure well. There were no complications. The patient received written and verbal post procedure care education. Post injection medications were not given.   Color Fundus Photography Optos - OU - Both Eyes  Result Date: 12/22/2021 Right Eye Progression has been stable. Disc findings include normal observations. Macula : retinal pigment epithelium abnormalities. Vessels : tortuous vessels, attenuated. Periphery : exudates, hemorrhage, RPE abnormality (Peripheral subretinal hemes IT periphery -- improved, surrounding exudates improved). Left Eye Progression has been stable. Disc findings include normal observations. Macula : normal observations. Vessels : attenuated. Periphery : hemorrhage, RPE abnormality (Stable improvement in sub retinal hemorrhage temporally (0200) -- SRH now white and pigment clumping surrounding). Notes **Images stored on drive** Impression: OD: IT schisis -- Peripheral subretinal hemes IT periphery -- improved, surrounding exudates improved -- PECHR OS: PEHCR with stably improved sub retinal hemorrhage temporally (0200)  OCT, Retina - OU - Both Eyes  Result Date: 12/22/2021 Right Eye Quality was good. Central Foveal Thickness: 355. Progression has been stable. 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 Left Eye Quality was good. Central Foveal Thickness: 326. Progression has improved. Findings include no SRF, abnormal foveal contour, retinal drusen , epiretinal membrane, intraretinal fluid, preretinal fibrosis (Mild interval improvement in central cystic changes/edema -- just trace cystic changes  remain, persistent ERM with PRF). 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 CME - cystoid macular edema. PED - pigment epithelial detachment. IRF - intraretinal fluid. SRF - subretinal fluid. EZ - ellipsoid zone. ERM - epiretinal membrane. ORA - outer retinal atrophy. ORT - outer retinal tubulation. SRHM -  subretinal hyper-reflective material    Procedures Procedures    Medications Ordered in ED Medications  cefTRIAXone (ROCEPHIN) 1 g in sodium chloride 0.9 % 100 mL IVPB (1 g Intravenous New Bag/Given 12/23/21 1522)  azithromycin (ZITHROMAX) 500 mg in sodium chloride 0.9 % 250 mL IVPB (has no administration in time range)  lactated ringers infusion ( Intravenous New Bag/Given 12/23/21 1517)  aspirin EC tablet 81 mg (has no administration in time range)  cholecalciferol (VITAMIN D3) 25 MCG (1000 UNIT) tablet 5,000 Units (has no administration in time range)  citalopram (CELEXA) tablet 20 mg (has no administration in time range)  donepezil (ARICEPT) tablet 10 mg (has no administration in time range)  levothyroxine (SYNTHROID) tablet 75 mcg (has no administration in time range)  memantine (NAMENDA) tablet 5 mg (has no administration in time range)  pravastatin (PRAVACHOL) tablet 10 mg (has no administration in time range)  cycloSPORINE (RESTASIS) 0.05 % ophthalmic emulsion 1 drop (has no administration in time range)  acidophilus (RISAQUAD) capsule 1 capsule (has no administration in time range)  oxybutynin (DITROPAN-XL) 24 hr tablet 5 mg (has no administration in time range)  pantoprazole (PROTONIX) EC tablet 40 mg (has no administration in time range)   hydrALAZINE (APRESOLINE) injection 10 mg (has no administration in time range)  lactated ringers bolus 500 mL ( Intravenous Stopped 12/23/21 1352)  acetaminophen (TYLENOL) tablet 325 mg (325 mg Oral Given 12/23/21 1247)  lactated ringers bolus 500 mL (500 mLs Intravenous New Bag/Given 12/23/21 1516)    ED Course/ Medical Decision Making/ A&P                           Medical Decision Making Amount and/or Complexity of Data Reviewed Labs: ordered.    Details: Lactate 2.7, white blood cell count is upper limits of normal but with a left shift. Radiology: ordered and independent interpretation performed.    Details: Right upper lobe pneumonia. ECG/medicine tests: ordered and independent interpretation performed.    Details: No acute ischemia.  Risk OTC drugs. Decision regarding hospitalization.   Patient's fever appears to be coming from pneumonia.  She is not hypoxic but does live alone and appears to be a little more confused than baseline or at least confused at times.  Her lactate is 2.7.  She was given some IV fluids.  Given all this, I think she will need admission and at least observation.  Started on Rocephin and azithromycin.  Discussed case with Dr. Thedore Mins who will admit.        Final Clinical Impression(s) / ED Diagnoses Final diagnoses:  Community acquired pneumonia of right upper lobe of lung    Rx / DC Orders ED Discharge Orders     None         Pricilla Loveless, MD 12/23/21 1523

## 2021-12-23 NOTE — ED Notes (Signed)
Bladder scan showed 0mL.  

## 2021-12-24 ENCOUNTER — Encounter (INDEPENDENT_AMBULATORY_CARE_PROVIDER_SITE_OTHER): Payer: Self-pay | Admitting: Ophthalmology

## 2021-12-24 ENCOUNTER — Inpatient Hospital Stay (HOSPITAL_COMMUNITY): Payer: Medicare Other

## 2021-12-24 DIAGNOSIS — G929 Unspecified toxic encephalopathy: Secondary | ICD-10-CM | POA: Diagnosis not present

## 2021-12-24 LAB — CBC WITH DIFFERENTIAL/PLATELET
Abs Immature Granulocytes: 0.07 10*3/uL (ref 0.00–0.07)
Basophils Absolute: 0 10*3/uL (ref 0.0–0.1)
Basophils Relative: 0 %
Eosinophils Absolute: 0 10*3/uL (ref 0.0–0.5)
Eosinophils Relative: 0 %
HCT: 28.1 % — ABNORMAL LOW (ref 36.0–46.0)
Hemoglobin: 9.6 g/dL — ABNORMAL LOW (ref 12.0–15.0)
Immature Granulocytes: 1 %
Lymphocytes Relative: 9 %
Lymphs Abs: 1.1 10*3/uL (ref 0.7–4.0)
MCH: 30 pg (ref 26.0–34.0)
MCHC: 34.2 g/dL (ref 30.0–36.0)
MCV: 87.8 fL (ref 80.0–100.0)
Monocytes Absolute: 0.7 10*3/uL (ref 0.1–1.0)
Monocytes Relative: 6 %
Neutro Abs: 10 10*3/uL — ABNORMAL HIGH (ref 1.7–7.7)
Neutrophils Relative %: 84 %
Platelets: 139 10*3/uL — ABNORMAL LOW (ref 150–400)
RBC: 3.2 MIL/uL — ABNORMAL LOW (ref 3.87–5.11)
RDW: 13.5 % (ref 11.5–15.5)
WBC: 11.9 10*3/uL — ABNORMAL HIGH (ref 4.0–10.5)
nRBC: 0 % (ref 0.0–0.2)

## 2021-12-24 LAB — COMPREHENSIVE METABOLIC PANEL
ALT: 15 U/L (ref 0–44)
AST: 20 U/L (ref 15–41)
Albumin: 2.6 g/dL — ABNORMAL LOW (ref 3.5–5.0)
Alkaline Phosphatase: 31 U/L — ABNORMAL LOW (ref 38–126)
Anion gap: 7 (ref 5–15)
BUN: 19 mg/dL (ref 8–23)
CO2: 24 mmol/L (ref 22–32)
Calcium: 8.7 mg/dL — ABNORMAL LOW (ref 8.9–10.3)
Chloride: 106 mmol/L (ref 98–111)
Creatinine, Ser: 0.99 mg/dL (ref 0.44–1.00)
GFR, Estimated: 53 mL/min — ABNORMAL LOW (ref >60)
Glucose, Bld: 95 mg/dL (ref 70–99)
Potassium: 3.9 mmol/L (ref 3.5–5.1)
Sodium: 137 mmol/L (ref 135–145)
Total Bilirubin: 0.6 mg/dL (ref 0.3–1.2)
Total Protein: 4.9 g/dL — ABNORMAL LOW (ref 6.5–8.1)

## 2021-12-24 LAB — PROCALCITONIN: Procalcitonin: 4.07 ng/mL

## 2021-12-24 LAB — BRAIN NATRIURETIC PEPTIDE: B Natriuretic Peptide: 470.9 pg/mL — ABNORMAL HIGH (ref 0.0–100.0)

## 2021-12-24 LAB — MAGNESIUM: Magnesium: 1.7 mg/dL (ref 1.7–2.4)

## 2021-12-24 MED ORDER — AMLODIPINE BESYLATE 10 MG PO TABS
10.0000 mg | ORAL_TABLET | Freq: Every day | ORAL | Status: DC
  Administered 2021-12-24 – 2021-12-25 (×2): 10 mg via ORAL
  Filled 2021-12-24: qty 1

## 2021-12-24 MED ORDER — ENSURE ENLIVE PO LIQD: 237.0000 mL | Freq: Two times a day (BID) | ORAL | Status: DC

## 2021-12-24 MED ORDER — MAGNESIUM SULFATE IN D5W 1-5 GM/100ML-% IV SOLN
1.0000 g | Freq: Once | INTRAVENOUS | Status: AC
  Administered 2021-12-24: 1 g via INTRAVENOUS
  Filled 2021-12-24: qty 100

## 2021-12-24 MED ORDER — ADULT MULTIVITAMIN W/MINERALS CH
1.0000 | ORAL_TABLET | Freq: Every day | ORAL | Status: DC
  Administered 2021-12-24 – 2021-12-25 (×2): 1 via ORAL
  Filled 2021-12-24 (×2): qty 1

## 2021-12-24 NOTE — Evaluation (Signed)
Occupational Therapy Evaluation Patient Details Name: Tracey Rosario MRN: [id number] DOB: 1926/12/09 Today's Date: 12/24/2021   History of Present Illness 88 yo admitted 12/21 febrile with confusion, encephalopathy and CAP. PMhx; mild dementia, depression, anxiety, anemia, Rt hip fx, HLD, bradycardia   Clinical Impression   Pt admitted with the above diagnoses and presents with below problem list. Pt will benefit from continued acute OT to address the below listed deficits and maximize independence with basic ADLs prior to d/c home. At baseline, pt is mod I with basic ADLs. She lives alone; family lives close by and assist as needed. Pt currently needs up to min guard assist with functional transfers/mobility. Daughter and granddaughter present and involved throughout session.       Recommendations for follow up therapy are one component of a multi-disciplinary discharge planning process, led by the attending physician.  Recommendations may be updated based on patient status, additional functional criteria and insurance authorization.   Follow Up Recommendations  Home health OT     Assistance Recommended at Discharge Set up Supervision/Assistance (for shower transfers, etc)  Patient can return home with the following Assist for transportation;Assistance with cooking/housework;A little help with bathing/dressing/bathroom    Functional Status Assessment  Patient has had a recent decline in their functional status and demonstrates the ability to make significant improvements in function in a reasonable and predictable amount of time.  Equipment Recommendations  None recommended by OT    Recommendations for Other Services       Precautions / Restrictions Precautions Precautions: Fall Restrictions Weight Bearing Restrictions: No      Mobility Bed Mobility               General bed mobility comments: up in chair at start and end of session    Transfers Overall  transfer level: Needs assistance Equipment used: Rolling walker (2 wheels), None Transfers: Sit to/from Stand Sit to Stand: Min guard           General transfer comment: to/from recliner. walker used to stand at start of session, no AD used to sit at end of session.      Balance Overall balance assessment: Needs assistance   Sitting balance-Leahy Scale: Fair     Standing balance support: Bilateral upper extremity supported, Reliant on assistive device for balance Standing balance-Leahy Scale: Poor Standing balance comment: tendency to reach for at least UE support in standing                           ADL either performed or assessed with clinical judgement   ADL Overall ADL's : Needs assistance/impaired Eating/Feeding: Set up;Sitting   Grooming: Set up;Sitting;Standing;Min guard   Upper Body Bathing: Set up;Sitting   Lower Body Bathing: Min guard;Sit to/from stand   Upper Body Dressing : Set up;Sitting   Lower Body Dressing: Min guard;Sit to/from stand   Toilet Transfer: Min guard;Ambulation   Toileting- Clothing Manipulation and Hygiene: Min guard;Sit to/from stand   Tub/ Shower Transfer: Min guard;Ambulation;Shower seat   Functional mobility during ADLs: Min guard;Rolling walker (2 wheels) (vs no AD in the room) General ADL Comments: Pt completed functional mobility to simulate walking around living space, functional transfers. Used walker in the hall, no AD in the room though pt noted to reach for intermittent single UE support (has cane and walker at home but did not consistently use PTA)     Vision Baseline Vision/History: 6 Macular Degeneration  Perception     Praxis      Pertinent Vitals/Pain Pain Assessment Pain Assessment: No/denies pain     Hand Dominance Right   Extremity/Trunk Assessment Upper Extremity Assessment Upper Extremity Assessment: Generalized weakness;LUE deficits/detail;RUE deficits/detail RUE Deficits /  Details: mild tremors at rest at baseline LUE Deficits / Details: tremors in LUE more than RUE at rest   Lower Extremity Assessment Lower Extremity Assessment: Defer to PT evaluation   Cervical / Trunk Assessment Cervical / Trunk Assessment: Kyphotic   Communication Communication Communication: No difficulties   Cognition Arousal/Alertness: Awake/alert                                           General Comments  Daughter and grandaughter present and involoved throughout session.    Exercises     Shoulder Instructions      Home Living Family/patient expects to be discharged to:: Private residence Living Arrangements: Alone Available Help at Discharge: Family;Available PRN/intermittently Type of Home: House Home Access: Level entry     Home Layout: One level     Bathroom Shower/Tub: Chief Strategy Officer: Standard     Home Equipment: Cane - single point   Additional Comments: pt with difficulty recalling stairs and DME into home      Prior Functioning/Environment Prior Level of Function : Independent/Modified Independent;Driving             Mobility Comments: pt states she doesnt' use AD and drives to daughter's house and nearby ADLs Comments: reports she has assist for cleaning        OT Problem List: Decreased strength;Decreased activity tolerance;Impaired balance (sitting and/or standing);Decreased knowledge of use of DME or AE;Decreased knowledge of precautions      OT Treatment/Interventions: Self-care/ADL training;DME and/or AE instruction;Therapeutic activities;Patient/family education;Balance training    OT Goals(Current goals can be found in the care plan section) Acute Rehab OT Goals Patient Stated Goal: home, prefers not to use AD for mobility OT Goal Formulation: With patient/family Time For Goal Achievement: 01/07/22 Potential to Achieve Goals: Good ADL Goals Pt Will Perform Upper Body Dressing: with modified  independence;sitting Pt Will Perform Lower Body Dressing: with modified independence;sit to/from stand Pt Will Transfer to Toilet: with modified independence;ambulating Pt Will Perform Toileting - Clothing Manipulation and hygiene: with modified independence;sit to/from stand Pt Will Perform Tub/Shower Transfer: with supervision;ambulating;shower seat;rolling walker  OT Frequency: Min 2X/week    Co-evaluation              AM-PAC OT "6 Clicks" Daily Activity     Outcome Measure Help from another person eating meals?: None Help from another person taking care of personal grooming?: None Help from another person toileting, which includes using toliet, bedpan, or urinal?: None Help from another person bathing (including washing, rinsing, drying)?: A Little Help from another person to put on and taking off regular upper body clothing?: None Help from another person to put on and taking off regular lower body clothing?: A Little 6 Click Score: 22   End of Session Equipment Utilized During Treatment: Rolling walker (2 wheels);Other (comment) (vs none) Nurse Communication: Other (comment) (nurse present throughout session)  Activity Tolerance: Patient tolerated treatment well;Patient limited by fatigue Patient left: in chair;with call bell/phone within reach;with chair alarm set;with family/visitor present  OT Visit Diagnosis: Unsteadiness on feet (R26.81);Muscle weakness (generalized) (M62.81)  Time: 2440-1027 OT Time Calculation (min): 20 min Charges:  OT General Charges $OT Visit: 1 Visit OT Evaluation $OT Eval Low Complexity: 1 Low  Raynald Kemp, OT Acute Rehabilitation Services Office: [phone]   Pilar Grammes 12/24/2021, 10:50 AM

## 2021-12-24 NOTE — Progress Notes (Signed)
Initial Nutrition Assessment  DOCUMENTATION CODES:   Non-severe (moderate) malnutrition in context of chronic illness  INTERVENTION:  Ensure Plus High Protein po BID, each supplement provides 350 kcal and 20 grams of protein (chocolate) Magic cup TID with meals, each supplement provides 290 kcal and 9 grams of protein (chocolate) MVI with minerals daily  NUTRITION DIAGNOSIS:   Moderate Malnutrition related to chronic illness (dementia) as evidenced by mild fat depletion, moderate fat depletion, moderate muscle depletion.   GOAL:   Patient will meet greater than or equal to 90% of their needs  MONITOR:   PO intake, Supplement acceptance, Labs, Weight trends  REASON FOR ASSESSMENT:   Malnutrition Screening Tool    ASSESSMENT:   Pt admitted from home with flu-like symptoms, noted to have toxic encephalopathy secondary to CAP. PMH significant for mild dementia, depression, anxiety, GERD, anemia of chronic disease, sinus bradycardia.  Pt sitting in chair with her daughter and grandddaughter present at bedside who assisted to provide nutrition related history. They report that she has not been eating well at home. Pt typically sleeps in late, therefore eating a late breakfast which consists of eggs and bacon. For dinner she eats something small such as a pork chop or spaghetti. She also drinks 1 boost high protein daily at home which her family brought to provide during admission.   Pt reports that her taste has been declining within the last year but she can taste sweet and salty items.   This morning they report that she ate really well for breakfast, in addition she also drank a Boost High Protein family brought for her and a Christmas Tree Little Debbie cake.   Meal completions: 12/22: 75% breakfast  They report her weight has gradually been declining over time. She used to weigh 160 lbs and now weighs about 132 lbs. They deny that this has been a rapid change.    Unfortunately, there is limited documentation of weights on file to review within the last year. Between 11/15-12/21 she is noted to have had a weight loss of 2.1% which is not clinically significant for time frame.   Medications: acidophilus, Vitamin D3, protonix  Labs: reviewed  NUTRITION - FOCUSED PHYSICAL EXAM:  Flowsheet Row Most Recent Value  Orbital Region Moderate depletion  Upper Arm Region Moderate depletion  Thoracic and Lumbar Region Mild depletion  Buccal Region Mild depletion  Temple Region Mild depletion  Clavicle Bone Region Moderate depletion  Clavicle and Acromion Bone Region Severe depletion  Scapular Bone Region Moderate depletion  Dorsal Hand Moderate depletion  Patellar Region Moderate depletion  Anterior Thigh Region Moderate depletion  Posterior Calf Region Mild depletion  Edema (RD Assessment) None  Hair Reviewed  Eyes Reviewed  Mouth Reviewed  Skin Reviewed  Nails Reviewed       Diet Order:   Diet Order             DIET SOFT Fluid consistency: Thin  Diet effective now                   EDUCATION NEEDS:   Education needs have been addressed  Skin:  Skin Assessment: Reviewed RN Assessment  Last BM:  12/21 (type 6/7 large)  Height:   Ht Readings from Last 1 Encounters:  12/23/21 5\' 11"  (1.803 m)    Weight:   Wt Readings from Last 1 Encounters:  12/23/21 60.8 kg   BMI:  Body mass index is 18.69 kg/m.  Estimated Nutritional Needs:   Kcal:  4967-5916  Protein:  70-85g  Fluid:  1.4-1.6L  Drusilla Kanner, RDN, LDN Clinical Nutrition

## 2021-12-24 NOTE — Evaluation (Signed)
Clinical/Bedside Swallow Evaluation Patient Details  Name: Tracey Rosario MRN: [id number] Date of Birth: Oct 17, 1926  Today's Date: 12/24/2021 Time: SLP Start Time (ACUTE ONLY): 1045 SLP Stop Time (ACUTE ONLY): 1100 SLP Time Calculation (min) (ACUTE ONLY): 15 min  Past Medical History:  Past Medical History:  Diagnosis Date   Acid reflux    Anemia    Arthropathy    Bradycardia    beta blocker reduced in 2021 due to this   CKD (chronic kidney disease), stage III (HCC)    Cognitive decline    referred to neurologist   Depression 10/07/2013   daughter didn't know anything about this   Dizziness    Frequent PVCs    GERD (gastroesophageal reflux disease) 05/04/2013   Hip fx (HCC)    r hip, bilat pubis rami   Hyperlipemia    Hypertension    Hypertensive retinopathy    OU   Hypothyroidism    Insomnia    Macular degeneration    Exu OS   Multilevel degenerative disc disease    Palpitations 05/04/2013   saw cardiologist in 2015, he deleted A. Fib from the record as per note not documentation to support   Premature atrial contractions    PSVT (paroxysmal supraventricular tachycardia)    a. short runs by monitor in 2021.   Short-term memory loss    Vitamin D deficiency    Past Surgical History:  Past Surgical History:  Procedure Laterality Date   ABDOMINAL HYSTERECTOMY  1971   BLADDER SURGERY  2010   Broken Pelvis  2015   C-EYE SURGERY PROCEDURE Bilateral    zaldivar   CATARACT EXTRACTION Bilateral    Done in [place]   EYE SURGERY Bilateral    Cat Sx   HIP PINNING,CANNULATED Right 10/07/2013   Procedure: CANNULATED screws right hip;  Surgeon: Kathryne Hitch, MD;  Location: [facility] OR;  Service: Orthopedics;  Laterality: Right;   HPI:  88yo female admitted 12/23/21 with flu symptoms, AMS, with confusion. PMH: mild dementia, depression/anxiety, GERD, CKD3, HLD, HTN, hypothyroid, macular degeneration    Assessment / Plan / Recommendation  Clinical Impression   Pt seen at bedside for assessment of swallow function and safety. She reports no difficulty swallowing prior to admit. Pt presents with adequate natural dentition. CN exam is unremarkable. Pt presents with functional swallow. No obvious oral issues or overt s/s aspiration following any consistency given. Pt was encouraged to notify PCP if lungs do not clear in a timely manner, swallowing difficulties begin to occur, or PNA becomes recurrent. No further ST intervention recommended at this time. SLP Visit Diagnosis: Dysphagia, unspecified (R13.10)    Aspiration Risk  Mild aspiration risk    Diet Recommendation Dysphagia 3 (Mech soft);Thin liquid   Liquid Administration via: Cup;Spoon Medication Administration: Whole meds with liquid Supervision: Patient able to self feed Compensations: Minimize environmental distractions;Small sips/bites;Slow rate Postural Changes: Seated upright at 90 degrees;Remain upright for at least 30 minutes after po intake    Other  Recommendations Oral Care Recommendations: Oral care BID    Recommendations for follow up therapy are one component of a multi-disciplinary discharge planning process, led by the attending physician.  Recommendations may be updated based on patient status, additional functional criteria and insurance authorization.      Prognosis Prognosis for Safe Diet Advancement: Good      Swallow Study   General Date of Onset: 12/23/21 HPI: 88yo female admitted 12/23/21 with flu symptoms, AMS, with confusion. PMH: mild dementia, depression/anxiety,  GERD, CKD3, HLD, HTN, hypothyroid, macular degeneration Type of Study: Bedside Swallow Evaluation Previous Swallow Assessment: none found Diet Prior to this Study: Thin liquids;Dysphagia 3 (soft) Temperature Spikes Noted: No Respiratory Status: Room air History of Recent Intubation: No Behavior/Cognition: Alert;Cooperative;Pleasant mood Oral Cavity Assessment: Within Functional Limits Oral Care  Completed by SLP: No Oral Cavity - Dentition: Adequate natural dentition Vision: Functional for self-feeding Self-Feeding Abilities: Able to feed self Patient Positioning: Upright in chair Baseline Vocal Quality: Normal Volitional Cough: Strong Volitional Swallow: Able to elicit    Oral/Motor/Sensory Function Overall Oral Motor/Sensory Function: Within functional limits   Ice Chips Ice chips: Not tested   Thin Liquid Thin Liquid: Within functional limits Presentation: Straw    Nectar Thick Nectar Thick Liquid: Not tested   Honey Thick Honey Thick Liquid: Not tested   Puree Puree: Within functional limits Presentation: Spoon;Self Fed   Solid     Solid: Within functional limits Presentation: [place] [facility], [facility], [place] Speech Language Pathologist Office: [phone]  Shonna Chock 12/24/2021,11:08 AM

## 2021-12-24 NOTE — Evaluation (Signed)
Physical Therapy Evaluation Patient Details Name: Tracey Rosario MRN: [id number] DOB: [birth year] Today's Date: 12/24/2021  History of Present Illness  88 yo admitted 12/21 febrile with confusion, encephalopathy and CAP. PMhx; mild dementia, depression, anxiety, anemia, Rt hip fx, HLD, bradycardia  Clinical Impression  Pt pleasant with decreased awareness of day. Pt with impaired balance and required min assist for transfers and gait. Pt normally lives alone with daughter near by. If family able to provide supervision and assist home with family would be appropriate with possible need for ALF long term. If family unable to assist ST-SNF recommended to maximize function and decrease fall risk. Pt with decreased balance, cognition, strength and function who will benefit from acute therapy to maximize mobility, safety and independence.         Recommendations for follow up therapy are one component of a multi-disciplinary discharge planning process, led by the attending physician.  Recommendations may be updated based on patient status, additional functional criteria and insurance authorization.  Follow Up Recommendations Home health PT      Assistance Recommended at Discharge Frequent or constant Supervision/Assistance  Patient can return home with the following  A little help with walking and/or transfers;Assistance with cooking/housework;Direct supervision/assist for medications management;Assist for transportation;Direct supervision/assist for financial management    Equipment Recommendations Rolling walker (2 wheels)  Recommendations for Other Services       Functional Status Assessment Patient has had a recent decline in their functional status and demonstrates the ability to make significant improvements in function in a reasonable and predictable amount of time.     Precautions / Restrictions Precautions Precautions: Fall      Mobility  Bed Mobility Overal bed  mobility: Needs Assistance Bed Mobility: Supine to Sit     Supine to sit: Min guard     General bed mobility comments: increased time with use of rail, guarding once trunk elevated as pt with posterior bias and increased time to scoot to EOB    Transfers Overall transfer level: Needs assistance   Transfers: Sit to/from Stand Sit to Stand: Min assist           General transfer comment: min assist to rise and stabilize with posterior bias    Ambulation/Gait Ambulation/Gait assistance: Min guard Gait Distance (Feet): 350 Feet Assistive device: Rolling walker (2 wheels) Gait Pattern/deviations: Step-through pattern, Decreased stride length   Gait velocity interpretation: <1.8 ft/sec, indicate of risk for recurrent falls   General Gait Details: cues for direction, proximity to RW and safety. Significantly improved balance with bil UE support  Stairs            Wheelchair Mobility    Modified Rankin (Stroke Patients Only)       Balance Overall balance assessment: Needs assistance   Sitting balance-Leahy Scale: Poor Sitting balance - Comments: guarding due to posterior bias Postural control: Posterior lean Standing balance support: Bilateral upper extremity supported, Reliant on assistive device for balance Standing balance-Leahy Scale: Poor                               Pertinent Vitals/Pain Pain Assessment Pain Assessment: No/denies pain    Home Living Family/patient expects to be discharged to:: Private residence Living Arrangements: Alone Available Help at Discharge: Family;Available PRN/intermittently Type of Home: House Home Access: Level entry       Home Layout: One level Home Equipment: Cane - single point Additional Comments: pt with difficulty  recalling stairs and DME into home    Prior Function Prior Level of Function : Independent/Modified Independent;Driving             Mobility Comments: pt states she doesnt' use AD  and drives to daughter's house and nearby ADLs Comments: reports she has assist for cleaning     Hand Dominance        Extremity/Trunk Assessment   Upper Extremity Assessment Upper Extremity Assessment: Generalized weakness    Lower Extremity Assessment Lower Extremity Assessment: Generalized weakness    Cervical / Trunk Assessment Cervical / Trunk Assessment: Kyphotic  Communication   Communication: No difficulties  Cognition Arousal/Alertness: Awake/alert Behavior During Therapy: WFL for tasks assessed/performed Overall Cognitive Status: Impaired/Different from baseline Area of Impairment: Orientation, Memory, Safety/judgement                 Orientation Level: Disoriented to, Time   Memory: Decreased short-term memory   Safety/Judgement: Decreased awareness of deficits              General Comments      Exercises     Assessment/Plan    PT Assessment Patient needs continued PT services  PT Problem List Decreased strength;Decreased mobility;Decreased safety awareness;Decreased activity tolerance;Decreased cognition;Decreased balance;Decreased knowledge of use of DME       PT Treatment Interventions Gait training;Therapeutic exercise;Patient/family education;DME instruction;Therapeutic activities;Cognitive remediation;Balance training;Functional mobility training    PT Goals (Current goals can be found in the Care Plan section)  Acute Rehab PT Goals Patient Stated Goal: return home PT Goal Formulation: With patient Time For Goal Achievement: 01/07/22 Potential to Achieve Goals: Good    Frequency Min 3X/week     Co-evaluation               AM-PAC PT "6 Clicks" Mobility  Outcome Measure Help needed turning from your back to your side while in a flat bed without using bedrails?: A Little Help needed moving from lying on your back to sitting on the side of a flat bed without using bedrails?: A Little Help needed moving to and from a bed  to a chair (including a wheelchair)?: A Little Help needed standing up from a chair using your arms (e.g., wheelchair or bedside chair)?: A Little Help needed to walk in hospital room?: A Little Help needed climbing 3-5 steps with a railing? : A Lot 6 Click Score: 17    End of Session Equipment Utilized During Treatment: Gait belt Activity Tolerance: Patient tolerated treatment well Patient left: in chair;with call bell/phone within reach;with chair alarm set Nurse Communication: Mobility status PT Visit Diagnosis: Other abnormalities of gait and mobility (R26.89);Muscle weakness (generalized) (M62.81)    Time: 0725-0750 PT Time Calculation (min) (ACUTE ONLY): 25 min   Charges:   PT Evaluation $PT Eval Moderate Complexity: 1 Mod PT Treatments $Therapeutic Activity: 8-22 mins        Bayard Males, PT Acute Rehabilitation Services Office: [phone]   Sandy Salaam Ramzey Petrovic 12/24/2021, 9:10 AM

## 2021-12-24 NOTE — Progress Notes (Signed)
PROGRESS NOTE                                                                                                                                                                                                             Patient Demographics:    Tracey Rosario, is a 88 y.o. female, DOB - 06/01/1926, SEG:[id number]  Outpatient Primary MD for the patient is Courtney Paris, NP    LOS - 1  Admit date - 12/23/2021    Chief Complaint  Patient presents with   Flu like symptoms       Brief Narrative (HPI from H&P)   88 y.o. female, with mild dementia, depression and anxiety, GERD, anemia of chronic disease, sinus bradycardia due to beta-blocker, who lives at home with close supervision by her daughter who lives close to her was brought in by EMS after she was found confused and febrile at home.  She was brought to the ER where she was diagnosed with toxic encephalopathy due to community-acquired pneumonia and I was called to admit the patient.    Subjective:    [name] today has, No headache, No chest pain, No abdominal pain - No Nausea, No new weakness tingling or numbness, proved cough and shortness of breath   Assessment  & Plan :   Toxic encephalopathy due to community-acquired pneumonia.  Infiltrate seems to be on the right side hence ruling out aspiration will be important, no history suggestive of the same though, for now we will obtain blood cultures, trend inflammatory markers, soft diet with feeding assistance aspiration precautions, speech evaluation.  Placed on empiric antibiotics, encouraged to stay in chair in the daytime use I-S and flutter valve for pulmonary toiletry.  Much improved this morning.  Delirium has improved.  Mentation close to normal.  Of note no headache with CT head being stable.  PT OT and speech eval today.  If stable likely home 12/25/2021.   2.  Early dementia and some anxiety.  Home  medications continued, again at risk for delirium explained clearly to patient's daughter, minimize narcotics and benzodiazepines if she gets confused use Haldol/Seroquel.   3.  GERD.  Placed on PPI.   4.  Hypertension.  Pressure much better after IV fluids start Norvasc.   5.  Dyslipidemia.  On statin continue.  Condition - Extremely Guarded  Family Communication  :  daughter susan daily  Code Status :  DNR  Consults  :    PUD Prophylaxis : PPI   Procedures  :            Disposition Plan  :    Status is: Inpatient  DVT Prophylaxis  :    enoxaparin (LOVENOX) injection 30 mg Start: 12/23/21 1530   Lab Results  Component Value Date   PLT 139 (L) 12/24/2021    Diet :  Diet Order             DIET SOFT Fluid consistency: Thin  Diet effective now                    Inpatient Medications  Scheduled Meds:  acidophilus  1 capsule Oral Daily   aspirin EC  81 mg Oral Daily   azithromycin  500 mg Oral Daily   cholecalciferol  5,000 Units Oral Daily   citalopram  20 mg Oral Daily   cycloSPORINE  1 drop Both Eyes BID   donepezil  10 mg Oral Daily   enoxaparin (LOVENOX) injection  30 mg Subcutaneous Q24H   levothyroxine  75 mcg Oral Daily   memantine  5 mg Oral Daily   oxybutynin  5 mg Oral Daily   pantoprazole  40 mg Oral Daily   pravastatin  10 mg Oral Daily   sodium chloride flush  3 mL Intravenous Q12H   Continuous Infusions:  ampicillin-sulbactam (UNASYN) IV Stopped (12/24/21 0552)   lactated ringers 75 mL/hr at 12/24/21 0600   magnesium sulfate bolus IVPB     PRN Meds:.acetaminophen **OR** acetaminophen, hydrALAZINE, ondansetron **OR** ondansetron (ZOFRAN) IV, polyethylene glycol    Objective:   Vitals:   12/23/21 1926 12/23/21 2349 12/24/21 0418 12/24/21 0730  BP: (!) 144/54 (!) 110/53 116/61 (!) 131/56  Pulse: (!) 57 (!) 59 60 (!) 59  Resp: 16 17 17 16   Temp: 97.7 F (36.5 C) 97.9 F (36.6 C) 97.9 F (36.6 C) 98.1 F (36.7 C)   TempSrc: Oral Oral Oral Oral  SpO2: 96% 95% 94% 94%  Weight:      Height:        Wt Readings from Last 3 Encounters:  12/23/21 60.8 kg  11/17/21 62.1 kg  06/06/19 67.9 kg     Intake/Output Summary (Last 24 hours) at 12/24/2021 0919 Last data filed at 12/24/2021 0600 Gross per 24 hour  Intake 2477.07 ml  Output 350 ml  Net 2127.07 ml     Physical Exam  Awake Alert, No new F.N deficits, Normal affect [place].AT,PERRAL Supple Neck, No JVD,   Symmetrical Chest wall movement, Good air movement bilaterally, CTAB RRR,No Gallops,Rubs or new Murmurs,  +ve B.Sounds, Abd Soft, No tenderness,   No Cyanosis, Clubbing or edema      Data Review:    Recent Labs  Lab 12/23/21 1153 12/23/21 1902 12/24/21 0303  WBC 10.4 15.8* 11.9*  HGB 11.5* 12.0 9.6*  HCT 33.9* 36.9 28.1*  PLT 187 212 139*  MCV 87.6 90.0 87.8  MCH 29.7 29.3 30.0  MCHC 33.9 32.5 34.2  RDW 13.4 13.6 13.5  LYMPHSABS 0.3*  --  1.1  MONOABS 0.6  --  0.7  EOSABS 0.0  --  0.0  BASOSABS 0.0  --  0.0    Recent Labs  Lab 12/23/21 1153 12/23/21 1225 12/23/21 1902 12/23/21 1908 12/24/21 0303 12/24/21 0305  NA 136  --   --   --  137  --   K 4.0  --   --   --  3.9  --   CL 104  --   --   --  106  --   CO2 23  --   --   --  24  --   ANIONGAP 9  --   --   --  7  --   GLUCOSE 128*  --   --   --  95  --   BUN 23  --   --   --  19  --   CREATININE 1.07*  --  1.15*  --  0.99  --   AST 31  --   --   --  20  --   ALT 15  --   --   --  15  --   ALKPHOS 34*  --   --   --  31*  --   BILITOT 0.4  --   --   --  0.6  --   ALBUMIN 3.4*  --   --   --  2.6*  --   PROCALCITON  --   --  3.69  --  4.07  --   LATICACIDVEN  --  2.7*  --  5.0*  --   --   INR 1.1  --   --   --   --   --   TSH  --   --  1.254  --   --   --   BNP  --   --   --   --   --  470.9*  MG  --   --   --   --  1.7  --   CALCIUM 9.1  --   --   --  8.7*  --       Radiology Reports DG Chest Port 1 View  Result Date: 12/24/2021 CLINICAL DATA:   Shortness of breath EXAM: PORTABLE CHEST 1 VIEW COMPARISON:  Yesterday FINDINGS: Unchanged right upper lobe infiltrate. As before, follow-up is recommended to ensure clearance. Biapical calcified pleural plaques. Normal heart size. IMPRESSION: Unchanged right upper lobe infiltrate. Electronically Signed   By: Jorje Guild M.D.   On: 12/24/2021 06:59   DG Chest 2 View  Result Date: 12/23/2021 CLINICAL DATA:  Fever EXAM: CHEST - 2 VIEW COMPARISON:  04/24/19 CXR FINDINGS: No pleural effusion. No pneumothorax. Redemonstrated are upper lung predominant pleural calcifications, not significantly changed compared to prior exam. Compared to prior exam there is a new focal consolidation in the right upper lung. Cardiac and mediastinal contours are unchanged. Visualized upper abdomen is unremarkable. Vertebral body heights are maintained. IMPRESSION: New focal consolidation in the right upper lung, concerning for pneumonia. Recommend follow-up chest radiograph in 4-6 weeks to ensure resolution. Electronically Signed   By: Marin Roberts M.D.   On: 12/23/2021 13:27   Intravitreal Injection, Pharmacologic Agent - OS - Left Eye  Result Date: 12/22/2021 Time Out 12/22/2021. 2:01 PM. Confirmed correct patient, procedure, site, and patient consented. Anesthesia Topical anesthesia was used. Anesthetic medications included Lidocaine 2%, Proparacaine 0.5%. Procedure Preparation included 5% betadine to ocular surface, eyelid speculum. A (32g) needle was used. Injection: 2 mg aflibercept 2 MG/0.05ML   Route: Intravitreal, Site: Left Eye   NDC: [id number], Lot: [id number], Expiration date: 03/03/2023, Waste: 0 mL Post-op Post injection exam found visual acuity of at least counting fingers. The patient tolerated the procedure well. There were no complications. The patient  received written and verbal post procedure care education. Post injection medications were not given.   Color Fundus Photography Optos - OU - Both  Eyes  Result Date: 12/22/2021 Right Eye Progression has been stable. Disc findings include normal observations. Macula : retinal pigment epithelium abnormalities. Vessels : tortuous vessels, attenuated. Periphery : exudates, hemorrhage, RPE abnormality (Peripheral subretinal hemes IT periphery -- improved, surrounding exudates improved). Left Eye Progression has been stable. Disc findings include normal observations. Macula : normal observations. Vessels : attenuated. Periphery : hemorrhage, RPE abnormality (Stable improvement in sub retinal hemorrhage temporally (0200) -- SRH now white and pigment clumping surrounding). Notes **Images stored on drive** Impression: OD: IT schisis -- Peripheral subretinal hemes IT periphery -- improved, surrounding exudates improved -- PECHR OS: PEHCR with stably improved sub retinal hemorrhage temporally (0200)  OCT, Retina - OU - Both Eyes  Result Date: 12/22/2021 Right Eye Quality was good. Central Foveal Thickness: 355. Progression has been stable. 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 Left Eye Quality was good. Central Foveal Thickness: 326. Progression has improved. Findings include no SRF, abnormal foveal contour, retinal drusen , epiretinal membrane, intraretinal fluid, preretinal fibrosis (Mild interval improvement in central cystic changes/edema -- just trace cystic changes remain, persistent ERM with PRF). 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 CME - cystoid macular edema. PED - pigment epithelial detachment. IRF - intraretinal fluid. SRF - subretinal fluid. EZ - ellipsoid zone. ERM - epiretinal membrane. ORA - outer retinal atrophy. ORT - outer retinal tubulation. SRHM - subretinal hyper-reflective material      Signature  -   Lala Lund M.D on 12/24/2021 at 9:19 AM   -  To page go to [email or web address]

## 2021-12-25 DIAGNOSIS — G929 Unspecified toxic encephalopathy: Secondary | ICD-10-CM | POA: Diagnosis not present

## 2021-12-25 MED ORDER — AZITHROMYCIN 250 MG PO TABS: 250.0000 mg | ORAL_TABLET | Freq: Every day | ORAL | 0 refills | Status: DC

## 2021-12-25 MED ORDER — ALBUTEROL SULFATE HFA 108 (90 BASE) MCG/ACT IN AERS: 2.0000 | INHALATION_SPRAY | Freq: Four times a day (QID) | RESPIRATORY_TRACT | 0 refills | Status: AC | PRN

## 2021-12-25 MED ORDER — AMOXICILLIN-POT CLAVULANATE 875-125 MG PO TABS: 1.0000 | ORAL_TABLET | Freq: Two times a day (BID) | ORAL | 0 refills | Status: DC

## 2021-12-25 NOTE — Progress Notes (Signed)
Patient was found trying to go out of the side door at the end of the hallway. Action taken to move patient to another room that was not close to the exit doors. Patient was compliant but really confused. Daughter arrived and was told of the room change and why. Bed alarms set and MD notified. Patient discharged today with daughter Tracey Rosario and supplied discharge instructions. Patient going home with home health services in place.

## 2021-12-25 NOTE — Discharge Instructions (Addendum)
Follow with Primary MD Courtney Paris, NP in 7 days   Get CBC, CMP, 2 view Chest X ray -  checked next visit with your primary MD    Activity: As tolerated with Full fall precautions use walker/cane & assistance as needed  Disposition Home   Diet: Heart Health soft consistency diet  Special Instructions: If you have smoked or chewed Tobacco  in the last 2 yrs please stop smoking, stop any regular Alcohol  and or any Recreational drug use.  On your next visit with your primary care physician please Get Medicines reviewed and adjusted.  Please request your Prim.MD to go over all Hospital Tests and Procedure/Radiological results at the follow up, please get all Hospital records sent to your Prim MD by signing hospital release before you go home.  If you experience worsening of your admission symptoms, develop shortness of breath, life threatening emergency, suicidal or homicidal thoughts you must seek medical attention immediately by calling 911 or calling your MD immediately  if symptoms less severe.  You Must read complete instructions/literature along with all the possible adverse reactions/side effects for all the Medicines you take and that have been prescribed to you. Take any new Medicines after you have completely understood and accpet all the possible adverse reactions/side effects.

## 2021-12-25 NOTE — Discharge Summary (Signed)
Tracey Rosario ZOX:[id number] DOB: 03-10-1926 DOA: 12/23/2021  PCP: Courtney Paris, NP  Admit date: 12/23/2021  Discharge date: 12/25/2021  Admitted From: Home   Disposition:  Home   Recommendations for Outpatient Follow-up:   Follow up with PCP in 1-2 weeks  PCP Please obtain BMP/CBC, 2 view CXR in 1week,  (see Discharge instructions)   PCP Please follow up on the following pending results:    Home Health: PT, OT   Equipment/Devices: as below  Consultations: None  Discharge Condition: Stable    CODE STATUS: Full    Diet Recommendation: Heart Healthy     Chief Complaint  Patient presents with   Flu like symptoms     Brief history of present illness from the day of admission and additional interim summary    88 y.o. female, with mild dementia, depression and anxiety, GERD, anemia of chronic disease, sinus bradycardia due to beta-blocker, who lives at home with close supervision by her daughter who lives close to her was brought in by EMS after she was found confused and febrile at home.  She was brought to the ER where she was diagnosed with toxic encephalopathy due to community-acquired pneumonia and I was called to admit the patient.                                                                   Hospital Course   Toxic encephalopathy due to community-acquired pneumonia.  Infiltrate seems to be on the right side hence ruling out aspiration will be important, no history suggestive of the same though, for now we will obtain blood cultures, trend inflammatory markers, soft diet with feeding assistance aspiration precautions, speech evaluation.  Placed on empiric antibiotics, encouraged to stay in chair in the daytime use I-S and flutter valve for pulmonary toiletry.  With antibiotics and supportive  care her mentation is back to normal, she is stable on room air and completely symptom-free.  CT head was unremarkable she never had any headache or focal deficits.  Now back to her baseline will be discharged home on p.o. antibiotics with outpatient follow-up with PCP within 7 to 10 days.   2.  Early dementia and some anxiety.  Home medications continued, no encephalopathy here.   3.  GERD.  Placed on PPI.   4.  Hypertension.  Stable blood pressure continue home regimen.   5.  Dyslipidemia.  On statin continue.     Discharge diagnosis     Principal Problem:   Toxic encephalopathy Active Problems:   Major depressive disorder with single episode, in full remission [facility])   Essential hypertension   Tremor   Esophageal reflux   Memory impairment   Age-related memory disorder    Discharge instructions    Discharge Instructions  Discharge instructions   Complete by: As directed    Follow with Primary MD Courtney ParisMcCoy, Rachel, NP in 7 days   Get CBC, CMP, 2 view Chest X ray -  checked next visit with your primary MD    Activity: As tolerated with Full fall precautions use walker/cane & assistance as needed  Disposition Home   Diet: Heart Health soft consistency diet  Special Instructions: If you have smoked or chewed Tobacco  in the last 2 yrs please stop smoking, stop any regular Alcohol  and or any Recreational drug use.  On your next visit with your primary care physician please Get Medicines reviewed and adjusted.  Please request your Prim.MD to go over all Hospital Tests and Procedure/Radiological results at the follow up, please get all Hospital records sent to your Prim MD by signing hospital release before you go home.  If you experience worsening of your admission symptoms, develop shortness of breath, life threatening emergency, suicidal or homicidal thoughts you must seek medical attention immediately by calling 911 or calling your MD immediately  if symptoms less  severe.  You Must read complete instructions/literature along with all the possible adverse reactions/side effects for all the Medicines you take and that have been prescribed to you. Take any new Medicines after you have completely understood and accpet all the possible adverse reactions/side effects.   Increase activity slowly   Complete by: As directed        Discharge Medications   Allergies as of 12/25/2021       Reactions   Codeine Other (See Comments)   Altered mental status        Medication List     TAKE these medications    albuterol 108 (90 Base) MCG/ACT inhaler Commonly known as: VENTOLIN HFA Inhale 2 puffs into the lungs every 6 (six) hours as needed for wheezing or shortness of breath.   amLODipine 10 MG tablet Commonly known as: NORVASC Take 10 mg by mouth daily.   amoxicillin-clavulanate 875-125 MG tablet Commonly known as: AUGMENTIN Take 1 tablet by mouth 2 (two) times daily.   aspirin EC 81 MG tablet Take 1 tablet (81 mg total) by mouth daily. What changed: when to take this   azithromycin 250 MG tablet Commonly known as: ZITHROMAX Take 1 tablet (250 mg total) by mouth daily.   citalopram 20 MG tablet Commonly known as: CELEXA Take 20 mg by mouth daily.   donepezil 23 MG Tabs tablet Commonly known as: ARICEPT Take 23 mg by mouth daily.   ferrous gluconate 324 MG tablet Commonly known as: FERGON Take 324 mg by mouth daily.   gabapentin 300 MG capsule Commonly known as: NEURONTIN Take 300 mg by mouth 3 (three) times daily.   levothyroxine 75 MCG tablet Commonly known as: SYNTHROID Take 75 mcg by mouth daily.   lisinopril 10 MG tablet Commonly known as: ZESTRIL Take 10 mg by mouth daily. What changed: Another medication with the same name was removed. Continue taking this medication, and follow the directions you see here.   memantine 5 MG tablet Commonly known as: NAMENDA TAKE 1 TABLET BY MOUTH EVERYDAY AT BEDTIME What changed:  See the new instructions.   omeprazole 20 MG capsule Commonly known as: PRILOSEC TAKE 1 CAPSULE DAILY   oxybutynin 5 MG 24 hr tablet Commonly known as: DITROPAN-XL Take 5 mg by mouth daily.   oxybutynin 10 MG 24 hr tablet Commonly known as: DITROPAN-XL Take 10 mg by mouth daily.  polyvinyl alcohol 1.4 % ophthalmic solution Commonly known as: LIQUIFILM TEARS Place 1 drop into both eyes as needed for dry eyes.   prednisoLONE acetate 1 % ophthalmic suspension Commonly known as: PRED FORTE Place 1 drop into the left eye 3 (three) times daily. What changed: when to take this   Prolensa 0.07 % Soln Generic drug: Bromfenac Sodium Place 1 drop into the left eye 3 (three) times daily. What changed: when to take this   Vitamin D3 125 MCG (5000 UT) Tabs Take 5,000 Units by mouth daily.               Durable Medical Equipment  (From admission, onward)           Start     Ordered   12/25/21 0847  For home use only DME Walker rolling  Once       Comments: 5 wheel  Question Answer Comment  Walker: With 5 Inch Wheels   Patient needs a walker to treat with the following condition Weakness      12/25/21 0846             Follow-up Information     Courtney Paris, NP. Schedule an appointment as soon as possible for a visit in 1 week(s).   Specialty: Nurse Practitioner Contact information: [address] [place] [address] [phone]                 Major procedures and Radiology Reports - PLEASE review detailed and final reports thoroughly  -       DG Chest Port 1 View  Result Date: 12/24/2021 CLINICAL DATA:  Shortness of breath EXAM: PORTABLE CHEST 1 VIEW COMPARISON:  Yesterday FINDINGS: Unchanged right upper lobe infiltrate. As before, follow-up is recommended to ensure clearance. Biapical calcified pleural plaques. Normal heart size. IMPRESSION: Unchanged right upper lobe infiltrate. Electronically Signed   By: Tiburcio Pea M.D.   On:  12/24/2021 06:59   DG Chest 2 View  Result Date: 12/23/2021 CLINICAL DATA:  Fever EXAM: CHEST - 2 VIEW COMPARISON:  04/24/19 CXR FINDINGS: No pleural effusion. No pneumothorax. Redemonstrated are upper lung predominant pleural calcifications, not significantly changed compared to prior exam. Compared to prior exam there is a new focal consolidation in the right upper lung. Cardiac and mediastinal contours are unchanged. Visualized upper abdomen is unremarkable. Vertebral body heights are maintained. IMPRESSION: New focal consolidation in the right upper lung, concerning for pneumonia. Recommend follow-up chest radiograph in 4-6 weeks to ensure resolution. Electronically Signed   By: Lorenza Cambridge M.D.   On: 12/23/2021 13:27   Intravitreal Injection, Pharmacologic Agent - OS - Left Eye  Result Date: 12/22/2021 Time Out 12/22/2021. 2:01 PM. Confirmed correct patient, procedure, site, and patient consented. Anesthesia Topical anesthesia was used. Anesthetic medications included Lidocaine 2%, Proparacaine 0.5%. Procedure Preparation included 5% betadine to ocular surface, eyelid speculum. A (32g) needle was used. Injection: 2 mg aflibercept 2 MG/0.05ML   Route: Intravitreal, Site: Left Eye   NDC: [id number], Lot: [id number], Expiration date: 03/03/2023, Waste: 0 mL Post-op Post injection exam found visual acuity of at least counting fingers. The patient tolerated the procedure well. There were no complications. The patient received written and verbal post procedure care education. Post injection medications were not given.   Color Fundus Photography Optos - OU - Both Eyes  Result Date: 12/22/2021 Right Eye Progression has been stable. Disc findings include normal observations. Macula : retinal pigment epithelium abnormalities. Vessels : tortuous vessels, attenuated. Periphery :  exudates, hemorrhage, RPE abnormality (Peripheral subretinal hemes IT periphery -- improved, surrounding exudates improved). Left  Eye Progression has been stable. Disc findings include normal observations. Macula : normal observations. Vessels : attenuated. Periphery : hemorrhage, RPE abnormality (Stable improvement in sub retinal hemorrhage temporally (0200) -- SRH now white and pigment clumping surrounding). Notes **Images stored on drive** Impression: OD: IT schisis -- Peripheral subretinal hemes IT periphery -- improved, surrounding exudates improved -- PECHR OS: PEHCR with stably improved sub retinal hemorrhage temporally (0200)  OCT, Retina - OU - Both Eyes  Result Date: 12/22/2021 Right Eye Quality was good. Central Foveal Thickness: 355. Progression has been stable. 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 Left Eye Quality was good. Central Foveal Thickness: 326. Progression has improved. Findings include no SRF, abnormal foveal contour, retinal drusen , epiretinal membrane, intraretinal fluid, preretinal fibrosis (Mild interval improvement in central cystic changes/edema -- just trace cystic changes remain, persistent ERM with PRF). 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 CME - cystoid macular edema. PED - pigment epithelial detachment. IRF - intraretinal fluid. SRF - subretinal fluid. EZ - ellipsoid zone. ERM - epiretinal membrane. ORA - outer retinal atrophy. ORT - outer retinal tubulation. SRHM - subretinal hyper-reflective material    Micro Results    Recent Results  (from the past 240 hour(s))  Resp panel by RT-PCR (RSV, Flu A&B, Covid) Anterior Nasal Swab     Status: None   Collection Time: 12/23/21 11:54 AM   Specimen: Anterior Nasal Swab  Result Value Ref Range Status   SARS Coronavirus 2 by RT PCR NEGATIVE NEGATIVE Final    Comment: (NOTE) SARS-CoV-2 target nucleic acids are NOT DETECTED.  The SARS-CoV-2 RNA is generally detectable in upper respiratory specimens during the acute phase of infection. The lowest concentration of SARS-CoV-2 viral copies this assay can detect is 138 copies/mL. A negative result does not preclude SARS-Cov-2 infection and should not be used as the sole basis for treatment or other patient management decisions. A negative result may occur with  improper specimen collection/handling, submission of specimen other than nasopharyngeal swab, presence of viral mutation(s) within the areas targeted by this assay, and inadequate number of viral copies(<138 copies/mL). A negative result must be combined with clinical observations, patient history, and epidemiological information. The expected result is Negative.  Fact Sheet for Patients:  BloggerCourse.com  Fact Sheet for Healthcare Providers:  SeriousBroker.it  This test is no t yet approved or cleared by the [place] FDA and  has been authorized for detection and/or diagnosis of SARS-CoV-2 by FDA under an Emergency Use Authorization (EUA). This EUA will remain  in effect (meaning this test can be used) for the duration of the COVID-19 declaration under Section 564(b)(1) of the Act, 21 U.S.C.section 360bbb-3(b)(1), unless the authorization is terminated  or revoked sooner.       Influenza A by PCR NEGATIVE NEGATIVE Final   Influenza B by PCR NEGATIVE NEGATIVE Final    Comment: (NOTE) The Xpert Xpress SARS-CoV-2/FLU/RSV plus assay is intended as an  aid in the diagnosis of influenza from Nasopharyngeal swab  specimens and should not be used as a sole basis for treatment. Nasal washings and aspirates are unacceptable for Xpert Xpress SARS-CoV-2/FLU/RSV testing.  Fact Sheet for Patients: BloggerCourse.com  Fact Sheet for Healthcare Providers: SeriousBroker.it  This test is not yet approved or cleared by the [place] FDA and has been authorized for detection and/or diagnosis of SARS-CoV-2 by FDA under an Emergency Use Authorization (EUA). This EUA will remain in effect (meaning this test can be used) for the duration of the COVID-19 declaration under Section 564(b)(1) of the Act, 21 U.S.C. section 360bbb-3(b)(1), unless the authorization is terminated or revoked.     Resp Syncytial Virus by PCR NEGATIVE NEGATIVE Final    Comment: (NOTE) Fact Sheet for Patients: BloggerCourse.com  Fact Sheet for Healthcare Providers: SeriousBroker.it  This test is not yet approved or cleared by the [place] FDA and has been authorized for detection and/or diagnosis of SARS-CoV-2 by FDA under an Emergency Use Authorization (EUA). This EUA will remain in effect (meaning this test can be used) for the duration of the COVID-19 declaration under Section 564(b)(1) of the Act, 21 U.S.C. section 360bbb-3(b)(1), unless the authorization is terminated or revoked.  Performed at [facility], [address], [place], [place] [address]   Blood Culture (routine x 2)     Status: None (Preliminary result)   Collection Time: 12/23/21 11:58 AM   Specimen: BLOOD RIGHT HAND  Result Value Ref Range Status   Specimen Description BLOOD RIGHT HAND  Final   Special Requests   Final    BOTTLES DRAWN AEROBIC AND ANAEROBIC Blood Culture adequate volume   Culture   Final    NO GROWTH < 24 HOURS Performed at [facility], [address], [place], [place] [address]    Report Status PENDING  Incomplete   Blood Culture (routine x 2)     Status: None (Preliminary result)   Collection Time: 12/23/21 12:20 PM   Specimen: BLOOD RIGHT WRIST  Result Value Ref Range Status   Specimen Description BLOOD RIGHT WRIST  Final   Special Requests   Final    BOTTLES DRAWN AEROBIC AND ANAEROBIC Blood Culture adequate volume   Culture   Final    NO GROWTH < 24 HOURS Performed at [facility], [address] [place], [place] [address]    Report Status PENDING  Incomplete    Today   Subjective    Everli Rother today has no headache,no chest abdominal pain,no new weakness tingling or numbness, feels much better wants to go home today.    Objective   Blood pressure (!) 149/54, pulse 65, temperature 97.9 F (36.6 C), temperature source Oral, resp. rate 17, height 5\' 11"  (1.803 m), weight 60.8 kg, SpO2 91 %.   Intake/Output Summary (Last 24 hours) at 12/25/2021 0859 Last data filed at 12/25/2021 12/27/2021 Gross per 24 hour  Intake 980 ml  Output 1000 ml  Net -20 ml    Exam  Awake Alert, No new F.N deficits,    [place].AT,PERRAL Supple Neck,   Symmetrical Chest wall movement, Good air movement bilaterally, CTAB RRR,No Gallops,   +ve B.Sounds, Abd Soft, Non tender,  No Cyanosis, Clubbing or edema    Data Review   Recent Labs  Lab 12/23/21 1153 12/23/21 1902 12/24/21 0303  WBC 10.4 15.8* 11.9*  HGB 11.5* 12.0 9.6*  HCT 33.9* 36.9 28.1*  PLT 187 212 139*  MCV  87.6 90.0 87.8  MCH 29.7 29.3 30.0  MCHC 33.9 32.5 34.2  RDW 13.4 13.6 13.5  LYMPHSABS 0.3*  --  1.1  MONOABS 0.6  --  0.7  EOSABS 0.0  --  0.0  BASOSABS 0.0  --  0.0    Recent Labs  Lab 12/23/21 1153 12/23/21 1225 12/23/21 1902 12/23/21 1908 12/24/21 0303 12/24/21 0305  NA 136  --   --   --  137  --   K 4.0  --   --   --  3.9  --   CL 104  --   --   --  106  --   CO2 23  --   --   --  24  --   ANIONGAP 9  --   --   --  7  --   GLUCOSE 128*  --   --   --  95  --   BUN 23  --   --   --  19  --   CREATININE  1.07*  --  1.15*  --  0.99  --   AST 31  --   --   --  20  --   ALT 15  --   --   --  15  --   ALKPHOS 34*  --   --   --  31*  --   BILITOT 0.4  --   --   --  0.6  --   ALBUMIN 3.4*  --   --   --  2.6*  --   PROCALCITON  --   --  3.69  --  4.07  --   LATICACIDVEN  --  2.7*  --  5.0*  --   --   INR 1.1  --   --   --   --   --   TSH  --   --  1.254  --   --   --   BNP  --   --   --   --   --  470.9*  MG  --   --   --   --  1.7  --   CALCIUM 9.1  --   --   --  8.7*  --      Total Time in preparing paper work, data evaluation and todays exam - 35 minutes  Signature  -    Susa Raring M.D on 12/25/2021 at 8:59 AM   -  To page go to [email or web address]

## 2021-12-25 NOTE — TOC Transition Note (Signed)
Transition of Care [facility]) - CM/SW Discharge Note   Patient Details  Name: Tracey Rosario MRN: [id number] Date of Birth: 1926-01-28  Transition of Care [facility]) CM/SW Contact:  Lawerance Sabal, RN Phone Number: 12/25/2021, 9:18 AM   Clinical Narrative:     Sherron Monday w patient's daughter who manages her care. She confirms patient has home assistance through [employer] 3x week for 2 hours. She is agreeable to add skilled services, defers choice of provider to CM. Referral accepted through [facility] who will call Darl Pikes to set up appointments. Darl Pikes confirms that she has all needed DME at home and that she will be able to provide her ride home.    Cooke,[name] (Daughter) [phone]   Final next level of care: Home w Home Health Services Barriers to Discharge: No Barriers Identified   Patient Goals and CMS Choice CMS Medicare.gov Compare Post Acute Care list provided to:: Other (Comment Required) Choice offered to / list presented to : Adult Children  Discharge Placement                         Discharge Plan and Services Additional resources added to the After Visit Summary for                            [facility] Arranged: PT, OT [facility] Agency: [facility] Date [facility] Agency Contacted: 12/25/21 Time HH Agency Contacted: [phone] Representative spoke with at [facility] Agency: Amy  Social Determinants of Health (SDOH) Interventions SDOH Screenings   Food Insecurity: No Food Insecurity (12/23/2021)  Housing: Low Risk  (12/23/2021)  Transportation Needs: No Transportation Needs (12/23/2021)  Utilities: Not At Risk (12/23/2021)  Tobacco Use: Medium Risk (12/24/2021)     Readmission Risk Interventions     No data to display

## 2021-12-28 LAB — CULTURE, BLOOD (ROUTINE X 2)
Culture: NO GROWTH
Culture: NO GROWTH
Special Requests: ADEQUATE
Special Requests: ADEQUATE

## 2022-01-05 ENCOUNTER — Emergency Department (HOSPITAL_COMMUNITY): Payer: Medicare Other

## 2022-01-05 ENCOUNTER — Inpatient Hospital Stay (HOSPITAL_COMMUNITY)
Admission: EM | Admit: 2022-01-05 | Discharge: 2022-01-14 | DRG: 871 | Disposition: A | Discharge status: Skilled Nursing Facility | Payer: Medicare Other | Attending: Internal Medicine | Admitting: Internal Medicine

## 2022-01-05 ENCOUNTER — Other Ambulatory Visit: Payer: Self-pay

## 2022-01-05 DIAGNOSIS — Z1152 Encounter for screening for COVID-19: Secondary | ICD-10-CM

## 2022-01-05 DIAGNOSIS — F32A Depression, unspecified: Secondary | ICD-10-CM | POA: Diagnosis present

## 2022-01-05 DIAGNOSIS — K219 Gastro-esophageal reflux disease without esophagitis: Secondary | ICD-10-CM | POA: Diagnosis present

## 2022-01-05 DIAGNOSIS — J189 Pneumonia, unspecified organism: Secondary | ICD-10-CM

## 2022-01-05 DIAGNOSIS — Z87891 Personal history of nicotine dependence: Secondary | ICD-10-CM

## 2022-01-05 DIAGNOSIS — J121 Respiratory syncytial virus pneumonia: Secondary | ICD-10-CM | POA: Diagnosis present

## 2022-01-05 DIAGNOSIS — Z7189 Other specified counseling: Secondary | ICD-10-CM

## 2022-01-05 DIAGNOSIS — I5033 Acute on chronic diastolic (congestive) heart failure: Secondary | ICD-10-CM | POA: Diagnosis not present

## 2022-01-05 DIAGNOSIS — I130 Hypertensive heart and chronic kidney disease with heart failure and stage 1 through stage 4 chronic kidney disease, or unspecified chronic kidney disease: Secondary | ICD-10-CM | POA: Diagnosis present

## 2022-01-05 DIAGNOSIS — R6520 Severe sepsis without septic shock: Secondary | ICD-10-CM | POA: Diagnosis present

## 2022-01-05 DIAGNOSIS — E785 Hyperlipidemia, unspecified: Secondary | ICD-10-CM | POA: Diagnosis present

## 2022-01-05 DIAGNOSIS — R4589 Other symptoms and signs involving emotional state: Secondary | ICD-10-CM | POA: Diagnosis not present

## 2022-01-05 DIAGNOSIS — I2489 Other forms of acute ischemic heart disease: Secondary | ICD-10-CM | POA: Diagnosis not present

## 2022-01-05 DIAGNOSIS — G4700 Insomnia, unspecified: Secondary | ICD-10-CM | POA: Diagnosis present

## 2022-01-05 DIAGNOSIS — R0602 Shortness of breath: Secondary | ICD-10-CM | POA: Diagnosis not present

## 2022-01-05 DIAGNOSIS — N1832 Chronic kidney disease, stage 3b: Secondary | ICD-10-CM | POA: Diagnosis present

## 2022-01-05 DIAGNOSIS — E039 Hypothyroidism, unspecified: Secondary | ICD-10-CM | POA: Diagnosis present

## 2022-01-05 DIAGNOSIS — F03A4 Unspecified dementia, mild, with anxiety: Secondary | ICD-10-CM | POA: Diagnosis present

## 2022-01-05 DIAGNOSIS — Z515 Encounter for palliative care: Secondary | ICD-10-CM

## 2022-01-05 DIAGNOSIS — A419 Sepsis, unspecified organism: Secondary | ICD-10-CM | POA: Diagnosis present

## 2022-01-05 DIAGNOSIS — E559 Vitamin D deficiency, unspecified: Secondary | ICD-10-CM | POA: Diagnosis present

## 2022-01-05 DIAGNOSIS — J9601 Acute respiratory failure with hypoxia: Secondary | ICD-10-CM | POA: Diagnosis present

## 2022-01-05 DIAGNOSIS — B338 Other specified viral diseases: Secondary | ICD-10-CM | POA: Diagnosis not present

## 2022-01-05 DIAGNOSIS — R451 Restlessness and agitation: Secondary | ICD-10-CM | POA: Diagnosis not present

## 2022-01-05 DIAGNOSIS — Z8249 Family history of ischemic heart disease and other diseases of the circulatory system: Secondary | ICD-10-CM | POA: Diagnosis not present

## 2022-01-05 DIAGNOSIS — F03A3 Unspecified dementia, mild, with mood disturbance: Secondary | ICD-10-CM | POA: Diagnosis present

## 2022-01-05 DIAGNOSIS — Z79899 Other long term (current) drug therapy: Secondary | ICD-10-CM

## 2022-01-05 DIAGNOSIS — Z66 Do not resuscitate: Secondary | ICD-10-CM | POA: Diagnosis present

## 2022-01-05 DIAGNOSIS — R7989 Other specified abnormal findings of blood chemistry: Secondary | ICD-10-CM | POA: Diagnosis not present

## 2022-01-05 DIAGNOSIS — Z885 Allergy status to narcotic agent status: Secondary | ICD-10-CM

## 2022-01-05 DIAGNOSIS — Z823 Family history of stroke: Secondary | ICD-10-CM | POA: Diagnosis not present

## 2022-01-05 DIAGNOSIS — D631 Anemia in chronic kidney disease: Secondary | ICD-10-CM | POA: Diagnosis present

## 2022-01-05 DIAGNOSIS — E876 Hypokalemia: Secondary | ICD-10-CM | POA: Diagnosis not present

## 2022-01-05 LAB — URINALYSIS, ROUTINE W REFLEX MICROSCOPIC
Bilirubin Urine: NEGATIVE
Glucose, UA: NEGATIVE mg/dL
Hgb urine dipstick: NEGATIVE
Ketones, ur: NEGATIVE mg/dL
Leukocytes,Ua: NEGATIVE
Nitrite: NEGATIVE
Protein, ur: 100 mg/dL — AB
Specific Gravity, Urine: 1.026 (ref 1.005–1.030)
pH: 5 (ref 5.0–8.0)

## 2022-01-05 LAB — STREP PNEUMONIAE URINARY ANTIGEN: Strep Pneumo Urinary Antigen: NEGATIVE

## 2022-01-05 LAB — CBC
HCT: 34.8 % — ABNORMAL LOW (ref 36.0–46.0)
Hemoglobin: 11.4 g/dL — ABNORMAL LOW (ref 12.0–15.0)
MCH: 29.1 pg (ref 26.0–34.0)
MCHC: 32.8 g/dL (ref 30.0–36.0)
MCV: 88.8 fL (ref 80.0–100.0)
Platelets: 270 10*3/uL (ref 150–400)
RBC: 3.92 MIL/uL (ref 3.87–5.11)
RDW: 14.1 % (ref 11.5–15.5)
WBC: 6.6 10*3/uL (ref 4.0–10.5)
nRBC: 0 % (ref 0.0–0.2)

## 2022-01-05 LAB — RESP PANEL BY RT-PCR (RSV, FLU A&B, COVID)  RVPGX2
Influenza A by PCR: NEGATIVE
Influenza B by PCR: NEGATIVE
Resp Syncytial Virus by PCR: POSITIVE — AB
SARS Coronavirus 2 by RT PCR: NEGATIVE

## 2022-01-05 LAB — CK: Total CK: 89 U/L (ref 38–234)

## 2022-01-05 LAB — COMPREHENSIVE METABOLIC PANEL
ALT: 14 U/L (ref 0–44)
AST: 23 U/L (ref 15–41)
Albumin: 3.6 g/dL (ref 3.5–5.0)
Alkaline Phosphatase: 35 U/L — ABNORMAL LOW (ref 38–126)
Anion gap: 11 (ref 5–15)
BUN: 19 mg/dL (ref 8–23)
CO2: 27 mmol/L (ref 22–32)
Calcium: 8.7 mg/dL — ABNORMAL LOW (ref 8.9–10.3)
Chloride: 97 mmol/L — ABNORMAL LOW (ref 98–111)
Creatinine, Ser: 1.06 mg/dL — ABNORMAL HIGH (ref 0.44–1.00)
GFR, Estimated: 49 mL/min — ABNORMAL LOW (ref >60)
Glucose, Bld: 97 mg/dL (ref 70–99)
Potassium: 4.2 mmol/L (ref 3.5–5.1)
Sodium: 135 mmol/L (ref 135–145)
Total Bilirubin: 0.5 mg/dL (ref 0.3–1.2)
Total Protein: 7.4 g/dL (ref 6.5–8.1)

## 2022-01-05 LAB — TROPONIN I (HIGH SENSITIVITY)
Troponin I (High Sensitivity): 152 ng/L (ref <18)
Troponin I (High Sensitivity): 134 ng/L (ref <18)
Troponin I (High Sensitivity): 125 ng/L (ref <18)

## 2022-01-05 LAB — PROTIME-INR
INR: 1.1 (ref 0.8–1.2)
Prothrombin Time: 13.9 seconds (ref 11.4–15.2)

## 2022-01-05 LAB — LACTIC ACID, PLASMA: Lactic Acid, Venous: 1.1 mmol/L (ref 0.5–1.9)

## 2022-01-05 LAB — APTT: aPTT: 22 seconds — ABNORMAL LOW (ref 24–36)

## 2022-01-05 LAB — CBG MONITORING, ED: Glucose-Capillary: 99 mg/dL (ref 70–99)

## 2022-01-05 MED ORDER — LEVOTHYROXINE SODIUM 75 MCG PO TABS
75.0000 ug | ORAL_TABLET | Freq: Every day | ORAL | Status: DC
  Administered 2022-01-06 – 2022-01-14 (×8): 75 ug via ORAL
  Filled 2022-01-05 (×8): qty 1

## 2022-01-05 MED ORDER — ENOXAPARIN SODIUM 40 MG/0.4ML IJ SOSY
40.0000 mg | PREFILLED_SYRINGE | INTRAMUSCULAR | Status: DC
  Administered 2022-01-05 – 2022-01-13 (×9): 40 mg via SUBCUTANEOUS
  Filled 2022-01-05 (×9): qty 0.4

## 2022-01-05 MED ORDER — GABAPENTIN 300 MG PO CAPS
300.0000 mg | ORAL_CAPSULE | Freq: Three times a day (TID) | ORAL | Status: DC
  Administered 2022-01-05 – 2022-01-14 (×13): 300 mg via ORAL
  Filled 2022-01-05 (×16): qty 1

## 2022-01-05 MED ORDER — VANCOMYCIN HCL 1250 MG/250ML IV SOLN
1250.0000 mg | Freq: Once | INTRAVENOUS | Status: AC
  Administered 2022-01-05: 1250 mg via INTRAVENOUS
  Filled 2022-01-05: qty 250

## 2022-01-05 MED ORDER — ONDANSETRON HCL 4 MG PO TABS: 4.0000 mg | ORAL_TABLET | Freq: Four times a day (QID) | ORAL | Status: DC | PRN

## 2022-01-05 MED ORDER — ALBUTEROL SULFATE (2.5 MG/3ML) 0.083% IN NEBU
2.5000 mg | INHALATION_SOLUTION | Freq: Four times a day (QID) | RESPIRATORY_TRACT | Status: DC | PRN
  Administered 2022-01-05 – 2022-01-09 (×2): 2.5 mg via RESPIRATORY_TRACT
  Filled 2022-01-05 (×2): qty 3

## 2022-01-05 MED ORDER — LACTATED RINGERS IV BOLUS (SEPSIS)
1000.0000 mL | Freq: Once | INTRAVENOUS | Status: AC
  Administered 2022-01-05: 1000 mL via INTRAVENOUS

## 2022-01-05 MED ORDER — LACTATED RINGERS IV SOLN: INTRAVENOUS | Status: AC

## 2022-01-05 MED ORDER — OXYBUTYNIN CHLORIDE ER 5 MG PO TB24: 5.0000 mg | ORAL_TABLET | Freq: Every day | ORAL | Status: DC

## 2022-01-05 MED ORDER — ACETAMINOPHEN 650 MG RE SUPP: 650.0000 mg | Freq: Four times a day (QID) | RECTAL | Status: DC | PRN

## 2022-01-05 MED ORDER — VANCOMYCIN HCL IN DEXTROSE 1-5 GM/200ML-% IV SOLN: 1000.0000 mg | Freq: Once | INTRAVENOUS | Status: DC

## 2022-01-05 MED ORDER — SODIUM CHLORIDE 0.9 % IV SOLN
2.0000 g | Freq: Once | INTRAVENOUS | Status: AC
  Administered 2022-01-05: 2 g via INTRAVENOUS
  Filled 2022-01-05: qty 12.5

## 2022-01-05 MED ORDER — MEMANTINE HCL 10 MG PO TABS
5.0000 mg | ORAL_TABLET | Freq: Every evening | ORAL | Status: DC
  Administered 2022-01-05 – 2022-01-13 (×5): 5 mg via ORAL
  Filled 2022-01-05 (×5): qty 1

## 2022-01-05 MED ORDER — PANTOPRAZOLE SODIUM 40 MG PO TBEC
40.0000 mg | DELAYED_RELEASE_TABLET | Freq: Every day | ORAL | Status: DC
  Administered 2022-01-05 – 2022-01-14 (×6): 40 mg via ORAL
  Filled 2022-01-05 (×8): qty 1

## 2022-01-05 MED ORDER — ASPIRIN 81 MG PO TBEC
81.0000 mg | DELAYED_RELEASE_TABLET | Freq: Every day | ORAL | Status: DC
  Administered 2022-01-05 – 2022-01-13 (×4): 81 mg via ORAL
  Filled 2022-01-05 (×4): qty 1

## 2022-01-05 MED ORDER — SODIUM CHLORIDE 0.9 % IV SOLN
2.0000 g | Freq: Two times a day (BID) | INTRAVENOUS | Status: DC
  Administered 2022-01-06 – 2022-01-12 (×13): 2 g via INTRAVENOUS
  Filled 2022-01-05 (×13): qty 12.5

## 2022-01-05 MED ORDER — GUAIFENESIN-DM 100-10 MG/5ML PO SYRP
5.0000 mL | ORAL_SOLUTION | ORAL | Status: DC | PRN
  Administered 2022-01-05 – 2022-01-11 (×3): 5 mL via ORAL
  Filled 2022-01-05 (×3): qty 10

## 2022-01-05 MED ORDER — VANCOMYCIN HCL 1250 MG/250ML IV SOLN
1250.0000 mg | INTRAVENOUS | Status: DC
  Administered 2022-01-07: 1250 mg via INTRAVENOUS
  Filled 2022-01-05: qty 250

## 2022-01-05 MED ORDER — ACETAMINOPHEN 500 MG PO TABS
1000.0000 mg | ORAL_TABLET | Freq: Once | ORAL | Status: AC
  Administered 2022-01-05: 1000 mg via ORAL
  Filled 2022-01-05: qty 2

## 2022-01-05 MED ORDER — ACETAMINOPHEN 325 MG PO TABS
650.0000 mg | ORAL_TABLET | Freq: Four times a day (QID) | ORAL | Status: DC | PRN
  Administered 2022-01-05 – 2022-01-06 (×2): 650 mg via ORAL
  Filled 2022-01-05 (×2): qty 2

## 2022-01-05 MED ORDER — OXYBUTYNIN CHLORIDE ER 5 MG PO TB24
10.0000 mg | ORAL_TABLET | Freq: Every day | ORAL | Status: DC
  Administered 2022-01-05 – 2022-01-14 (×6): 10 mg via ORAL
  Filled 2022-01-05 (×3): qty 2
  Filled 2022-01-05: qty 1
  Filled 2022-01-05 (×2): qty 2
  Filled 2022-01-05: qty 1
  Filled 2022-01-05: qty 2

## 2022-01-05 MED ORDER — IPRATROPIUM-ALBUTEROL 0.5-2.5 (3) MG/3ML IN SOLN
3.0000 mL | Freq: Four times a day (QID) | RESPIRATORY_TRACT | Status: DC
  Administered 2022-01-05 – 2022-01-06 (×4): 3 mL via RESPIRATORY_TRACT
  Filled 2022-01-05 (×5): qty 3

## 2022-01-05 MED ORDER — ONDANSETRON HCL 4 MG/2ML IJ SOLN: 4.0000 mg | Freq: Four times a day (QID) | INTRAMUSCULAR | Status: DC | PRN

## 2022-01-05 MED ORDER — IPRATROPIUM BROMIDE 0.02 % IN SOLN
0.5000 mg | Freq: Four times a day (QID) | RESPIRATORY_TRACT | Status: DC
  Administered 2022-01-05: 0.5 mg via RESPIRATORY_TRACT
  Filled 2022-01-05: qty 2.5

## 2022-01-05 MED ORDER — POLYVINYL ALCOHOL 1.4 % OP SOLN: 1.0000 [drp] | OPHTHALMIC | Status: DC | PRN

## 2022-01-05 MED ORDER — SODIUM CHLORIDE 0.9 % IV SOLN: INTRAVENOUS | Status: DC

## 2022-01-05 MED ORDER — CITALOPRAM HYDROBROMIDE 20 MG PO TABS
20.0000 mg | ORAL_TABLET | Freq: Every day | ORAL | Status: DC
  Administered 2022-01-05 – 2022-01-14 (×6): 20 mg via ORAL
  Filled 2022-01-05: qty 1
  Filled 2022-01-05 (×2): qty 2
  Filled 2022-01-05 (×5): qty 1

## 2022-01-05 MED ORDER — ALBUTEROL SULFATE HFA 108 (90 BASE) MCG/ACT IN AERS: 2.0000 | INHALATION_SPRAY | Freq: Four times a day (QID) | RESPIRATORY_TRACT | Status: DC | PRN

## 2022-01-05 MED ORDER — FERROUS GLUCONATE 324 (38 FE) MG PO TABS
324.0000 mg | ORAL_TABLET | Freq: Every day | ORAL | Status: DC
  Administered 2022-01-07 – 2022-01-14 (×5): 324 mg via ORAL
  Filled 2022-01-05 (×9): qty 1

## 2022-01-05 MED ORDER — ALBUTEROL SULFATE (2.5 MG/3ML) 0.083% IN NEBU
2.5000 mg | INHALATION_SOLUTION | Freq: Four times a day (QID) | RESPIRATORY_TRACT | Status: DC
  Administered 2022-01-05: 2.5 mg via RESPIRATORY_TRACT
  Filled 2022-01-05: qty 3

## 2022-01-05 MED ORDER — DONEPEZIL HCL 23 MG PO TABS
23.0000 mg | ORAL_TABLET | Freq: Every day | ORAL | Status: DC
  Administered 2022-01-05 – 2022-01-14 (×6): 23 mg via ORAL
  Filled 2022-01-05 (×10): qty 1

## 2022-01-05 NOTE — Sepsis Progress Note (Signed)
Code Sepsis protocol being monitored by [employer]. 

## 2022-01-05 NOTE — H&P (Addendum)
History and Physical    Tracey Rosario WNU:[id number] DOB: 1926/11/09 DOA: 01/05/2022  PCP: Courtney Paris, NP   Patient coming from: home, lives alone Chief Complaint  Patient presents with   Weakness   HPI: 89 year old female with history of dementia anxiety/depression, GERD, HLD, hypertension, anemia of chronic disease, sinus bradycardia due to beta-blocker recent admission 12/21 - 12/23 for toxic encephalopathy due to Community acquired pneumonia who lives alone brought to the ED with complaint of generalized weakness, fever and hypoxic 83% on EMS arrival, fever of 102.5.  Patient has been still on bed and too weak to get out of bed, has been exposed to RSV with family members. In the ED, vitals: Fever order 2.5 BP 140s to 160s, needing 5 L nasal cannula oxygen. Labs: Stable CBC, bmp/lactic acid> fairly normal.Troponin/blood culture/respiratory virus panel pending. Chest x-ray new patchy infiltrate seen in the right middle and right lower lung suggestive of multifocal pneumonia, small right pleural effusion, infiltrate in the right upper lobe has not changed significantly. Patient appeared to be confused with generalized weakness. Code sepsis was activated: Vancomycin, cefepime and fluid bolus was ordered. Admission was requested. She is on 6l HFNC. She appears comfortable and denies any chest pain shortness of breath, chills, nausea or vomiting Her daughter is at bedside.   Assessment/Plan Principal Problem:   Severe sepsis (HCC)  Multifocal pneumonia possible healthcare associated Acute hypoxic respiratory failure Severe sepsis POA: Chest x-ray with multifocal pneumonia RML and RLL, lactic acid blood work fairly stable.  Follow-up COVID/flu, check respiratory virus panel.  Continue prednisone antibiotics vancomycin/cefepime, pulmonary toileting, IV fluid hydration, supplemental oxygen. Covid/RSV/Flu is still pending- Dr Manus Gunning to follow. Check resp  culture/strep/legionella ag.  Mild Dementia: She is pleasant,alert awake.  Continue Namenda/Aricept. Anxiety/depression: Mood is stable.  Continue home Celexa slacks Neurontin GERD: Continue PPI HLD: Resume statin. Check CK Hypothyroidism: Resume thyroid Hypertension: BP stable will hold home lisinopril/Amlodipine Anemia of chronic disease:hb stable  Generalized weakness: PTOT  eval as able Tested + for RSV + trops 152- no chest pain EKG ok. Check serial trop and echo.  Body mass index is 18.45 kg/m.   Severity of Illness: The appropriate patient status for this patient is INPATIENT. Inpatient status is judged to be reasonable and necessary in order to provide the required intensity of service to ensure the patient's safety. The patient's presenting symptoms, physical exam findings, and initial radiographic and laboratory data in the context of their chronic comorbidities is felt to place them at high risk for further clinical deterioration. Furthermore, it is not anticipated that the patient will be medically stable for discharge from the hospital within 2 midnights of admission.   * I certify that at the point of admission it is my clinical judgment that the patient will require inpatient hospital care spanning beyond 2 midnights from the point of admission due to high intensity of service, high risk for further deterioration and high frequency of surveillance required.*   DVT prophylaxis: enoxaparin (LOVENOX) injection 40 mg Start: 01/05/22 2200 SCDs Start: 01/05/22 1622 Code Status:   Code Status: Full Code  Family Communication: Admission, patients condition and plan of care including tests being ordered have been discussed with the patient and her daughter who indicate understanding and agree with the plan and Code Status.  Consults called:  none  Review of Systems: All systems were reviewed and were negative except as mentioned in HPI above. Negative for focal weakness Negative  for chest pain Negative for shortness  of breath  Past Medical History:  Diagnosis Date   Acid reflux    Anemia    Arthropathy    Bradycardia    beta blocker reduced in 2021 due to this   CKD (chronic kidney disease), stage III (HCC)    Cognitive decline    referred to neurologist   Depression 10/07/2013   daughter didn't know anything about this   Dizziness    Frequent PVCs    GERD (gastroesophageal reflux disease) 05/04/2013   Hip fx ([facility])    r hip, bilat pubis rami   Hyperlipemia    Hypertension    Hypertensive retinopathy    OU   Hypothyroidism    Insomnia    Macular degeneration    Exu OS   Multilevel degenerative disc disease    Palpitations 05/04/2013   saw cardiologist in 2015, he deleted A. Fib from the record as per note not documentation to support   Premature atrial contractions    PSVT (paroxysmal supraventricular tachycardia)    a. short runs by monitor in 2021.   Short-term memory loss    Vitamin D deficiency     Past Surgical History:  Procedure Laterality Date   ABDOMINAL HYSTERECTOMY  1971   BLADDER SURGERY  2010   Broken Pelvis  2015   C-EYE SURGERY PROCEDURE Bilateral    [facility]   CATARACT EXTRACTION Bilateral    Done in [place] Bilateral    Cat Sx   HIP PINNING,CANNULATED Right 10/07/2013   Procedure: CANNULATED screws right hip;  Surgeon: Mcarthur Rossetti, MD;  Location: [facility];  Service: Orthopedics;  Laterality: Right;     reports that she quit smoking about 51 years ago. Her smoking use included cigarettes. She has never used smokeless tobacco. She reports that she does not drink alcohol and does not use drugs.  Allergies  Allergen Reactions   Codeine Other (See Comments)    Altered mental status    Family History  Problem Relation Age of Onset   Prostate cancer Father    Heart attack Sister    Cancer Brother    Cancer Sister    Heart attack Sister    Cancer Sister    Stroke Daughter    Dementia Neg Hx     Alzheimer's disease Neg Hx      Prior to Admission medications   Medication Sig Start Date End Date Taking? Authorizing Provider  albuterol (VENTOLIN HFA) 108 (90 Base) MCG/ACT inhaler Inhale 2 puffs into the lungs every 6 (six) hours as needed for wheezing or shortness of breath. 12/25/21   Thurnell Lose, MD  amLODipine (NORVASC) 10 MG tablet Take 10 mg by mouth daily. 12/05/19   [provider]  amoxicillin-clavulanate (AUGMENTIN) 875-125 MG tablet Take 1 tablet by mouth 2 (two) times daily. 12/25/21   Thurnell Lose, MD  aspirin EC 81 MG tablet Take 1 tablet (81 mg total) by mouth daily. Patient taking differently: Take 81 mg by mouth at bedtime. 06/06/19   Dunn, Nedra Hai, PA-C  azithromycin (ZITHROMAX) 250 MG tablet Take 1 tablet (250 mg total) by mouth daily. 12/25/21   Thurnell Lose, MD  Bromfenac Sodium (PROLENSA) 0.07 % SOLN Place 1 drop into the left eye 3 (three) times daily. Patient taking differently: Place 1 drop into the left eye in the morning and at bedtime. 06/23/21   Bernarda Caffey, MD  Cholecalciferol (VITAMIN D3) 125 MCG (5000 UT) TABS Take 5,000 Units by  mouth daily.     [provider]  citalopram (CELEXA) 20 MG tablet Take 20 mg by mouth daily. 04/29/19   [provider]  donepezil (ARICEPT) 23 MG TABS tablet Take 23 mg by mouth daily. 11/22/21   [provider]  ferrous gluconate (FERGON) 324 MG tablet Take 324 mg by mouth daily. 04/29/19   [provider]  gabapentin (NEURONTIN) 300 MG capsule Take 300 mg by mouth 3 (three) times daily. 06/05/20   [provider]  levothyroxine (SYNTHROID) 75 MCG tablet Take 75 mcg by mouth daily. 03/04/20   [provider]  lisinopril (ZESTRIL) 10 MG tablet Take 10 mg by mouth daily. 12/02/20   [provider]  memantine (NAMENDA) 5 MG tablet TAKE 1 TABLET BY MOUTH EVERYDAY AT BEDTIME Patient taking differently: Take 5 mg by mouth daily. 12/10/21   Marcos Eke,  PA-C  omeprazole (PRILOSEC) 20 MG capsule TAKE 1 CAPSULE DAILY Patient taking differently: Take 20 mg by mouth daily. 10/20/17   [name], DO  oxybutynin (DITROPAN-XL) 10 MG 24 hr tablet Take 10 mg by mouth daily. 10/25/21   [provider]  oxybutynin (DITROPAN-XL) 5 MG 24 hr tablet Take 5 mg by mouth daily. 12/14/20   [provider]  polyvinyl alcohol (LIQUIFILM TEARS) 1.4 % ophthalmic solution Place 1 drop into both eyes as needed for dry eyes.    [provider]  prednisoLONE acetate (PRED FORTE) 1 % ophthalmic suspension Place 1 drop into the left eye 3 (three) times daily. Patient taking differently: Place 1 drop into the left eye in the morning and at bedtime. 06/23/21   Rennis Chris, MD    Physical Exam: Vitals:   01/05/22 1515 01/05/22 1530 01/05/22 1600 01/05/22 1618  BP: (!) 165/56 (!) 152/55 (!) 116/106   Pulse: 66 70 60   Resp: (!) 23 (!) 29 (!) 21   Temp:    98.7 F (37.1 C)  TempSrc:    Oral  SpO2: 92% 95% 96%   Weight:      Height:        General exam: AAO, pleasant, not in distress, on 6l [place] HEENT:Oral mucosa moist, Ear/Nose WNL grossly, dentition normal. Respiratory system: bilaterally clear, mild crackles on right lungs,no use of accessory muscle Cardiovascular system: S1 & S2 +, No JVD,. Gastrointestinal system: Abdomen soft, NT,ND, BS+ Nervous System:Alert, awake, moving extremities and grossly nonfocal Extremities: No edema, distal peripheral pulses palpable.  Skin: No rashes,no icterus. MSK: Normal muscle bulk,tone, power   Labs on Admission: I have personally reviewed following labs and imaging studies  CBC: Recent Labs  Lab 01/05/22 1301  WBC 6.6  HGB 11.4*  HCT 34.8*  MCV 88.8  PLT 270   Basic Metabolic Panel: Recent Labs  Lab 01/05/22 1301  NA 135  K 4.2  CL 97*  CO2 27  GLUCOSE 97  BUN 19  CREATININE 1.06*  CALCIUM 8.7*   GFR: Estimated Creatinine Clearance: 31.4 mL/min (A) (by C-G formula based  on SCr of 1.06 mg/dL (H)). Liver Function Tests: Recent Labs  Lab 01/05/22 1301  AST 23  ALT 14  ALKPHOS 35*  BILITOT 0.5  PROT 7.4  ALBUMIN 3.6   No results for input(s): "LIPASE", "AMYLASE" in the last 168 hours. No results for input(s): "AMMONIA" in the last 168 hours. Coagulation Profile: Recent Labs  Lab 01/05/22 1341  INR 1.1   Cardiac Enzymes: No results for input(s): "CKTOTAL", "CKMB", "CKMBINDEX", "TROPONINI" in the  last 168 hours. BNP (last 3 results) No results for input(s): "PROBNP" in the last 8760 hours. HbA1C: No results for input(s): "HGBA1C" in the last 72 hours. CBG: Recent Labs  Lab 01/05/22 1447  GLUCAP 99   Lipid Profile: No results for input(s): "CHOL", "HDL", "LDLCALC", "TRIG", "CHOLHDL", "LDLDIRECT" in the last 72 hours. Thyroid Function Tests: No results for input(s): "TSH", "T4TOTAL", "FREET4", "T3FREE", "THYROIDAB" in the last 72 hours. Anemia Panel: No results for input(s): "VITAMINB12", "FOLATE", "FERRITIN", "TIBC", "IRON", "RETICCTPCT" in the last 72 hours. Urine analysis:    Component Value Date/Time   COLORURINE YELLOW 04/24/2019 1627   APPEARANCEUR HAZY (A) 04/24/2019 1627   LABSPEC 1.021 04/24/2019 1627   PHURINE 5.0 04/24/2019 1627   GLUCOSEU NEGATIVE 04/24/2019 1627   HGBUR NEGATIVE 04/24/2019 1627   BILIRUBINUR NEGATIVE 04/24/2019 1627   BILIRUBINUR 1+ 07/29/2013 1138   KETONESUR NEGATIVE 04/24/2019 1627   PROTEINUR 30 (A) 04/24/2019 1627   UROBILINOGEN 0.2 10/07/2013 1331   NITRITE NEGATIVE 04/24/2019 1627   LEUKOCYTESUR NEGATIVE 04/24/2019 1627    Radiological Exams on Admission: DG Chest 2 View  Result Date: 01/05/2022 CLINICAL DATA:  Shortness of breath, fever EXAM: CHEST - 2 VIEW COMPARISON:  Previous studies including the examination done on 12/24/2021 FINDINGS: Transverse diameter of heart is increased. There is interval appearance of new patchy infiltrates in right mid and right lower lung fields. There is interval  appearance of small right pleural effusion. Infiltrate in right upper lung fields has not changed. Extensive pleural calcifications and pleural thickening are noted in both apices. Left lung shows no new focal infiltrates. IMPRESSION: New patchy infiltrates are seen in right mid and right lower lung fields suggesting multifocal pneumonia. Small right pleural effusion. Infiltrate in right upper lobe has not changed significantly. Electronically Signed   By: Elmer Picker M.D.   On: 01/05/2022 13:43    Antonieta Pert MD [facility]  If 7PM-7AM, please contact night-coverage [email or web address]  01/05/2022, 4:24 PM

## 2022-01-05 NOTE — ED Triage Notes (Addendum)
Pt BIB EMS from home w/ c/o generalized weakness and fever. Pt had similar episode a few weeks ago. Pt noted to be 81% on room air on ems arrival, 87% room air in triage. Pt not normally on o2

## 2022-01-05 NOTE — Sepsis Progress Note (Signed)
Secure chat to paramedic assigned to pt regarding need to administer fluid bolus, draw lactic acid and blood cx, and start antibiotics.

## 2022-01-05 NOTE — ED Provider Notes (Signed)
[facility] DEPT Provider Note   CSN: [id number] Arrival date & time: 01/05/22  1253     History  Chief Complaint  Patient presents with   Weakness    Tracey Rosario is a 89 y.o. female.  Level 5 caveat for dementia.  Patient brought in by EMS with generalized weakness, fatigue and fever.  She was recently admitted to the hospital December 21 to 23 with pneumonia and finished antibiotics.  Patient lives alone.  On arrival today her patient's daughter found patient still in bed and too weak to get out of bed.  Patient does not know why she is here.  She denies any headache, chest pain, shortness of breath, nausea or vomiting.  Found to be hypoxic to 80% on EMS arrival with fever 102.5.  Does not wear oxygen at home.  No history of COPD or asthma.  She is still coughing.  Denies any headache.  Denies any chest pain.  Denies any vomiting, diarrhea, pain with urination or blood in the urine.  Has been exposed to RSV with family members.  Patient unable to give any history.  The history is provided by the patient and a relative. The history is limited by the condition of the patient.  Weakness      Home Medications Prior to Admission medications   Medication Sig Start Date End Date Taking? Authorizing Provider  albuterol (VENTOLIN HFA) 108 (90 Base) MCG/ACT inhaler Inhale 2 puffs into the lungs every 6 (six) hours as needed for wheezing or shortness of breath. 12/25/21   Thurnell Lose, MD  amLODipine (NORVASC) 10 MG tablet Take 10 mg by mouth daily. 12/05/19   [provider]  amoxicillin-clavulanate (AUGMENTIN) 875-125 MG tablet Take 1 tablet by mouth 2 (two) times daily. 12/25/21   Thurnell Lose, MD  aspirin EC 81 MG tablet Take 1 tablet (81 mg total) by mouth daily. Patient taking differently: Take 81 mg by mouth at bedtime. 06/06/19   Dunn, Nedra Hai, PA-C  azithromycin (ZITHROMAX) 250 MG tablet Take 1 tablet (250 mg total) by  mouth daily. 12/25/21   Thurnell Lose, MD  Bromfenac Sodium (PROLENSA) 0.07 % SOLN Place 1 drop into the left eye 3 (three) times daily. Patient taking differently: Place 1 drop into the left eye in the morning and at bedtime. 06/23/21   Bernarda Caffey, MD  Cholecalciferol (VITAMIN D3) 125 MCG (5000 UT) TABS Take 5,000 Units by mouth daily.     [provider]  citalopram (CELEXA) 20 MG tablet Take 20 mg by mouth daily. 04/29/19   [provider]  donepezil (ARICEPT) 23 MG TABS tablet Take 23 mg by mouth daily. 11/22/21   [provider]  ferrous gluconate (FERGON) 324 MG tablet Take 324 mg by mouth daily. 04/29/19   [provider]  gabapentin (NEURONTIN) 300 MG capsule Take 300 mg by mouth 3 (three) times daily. 06/05/20   [provider]  levothyroxine (SYNTHROID) 75 MCG tablet Take 75 mcg by mouth daily. 03/04/20   [provider]  lisinopril (ZESTRIL) 10 MG tablet Take 10 mg by mouth daily. 12/02/20   [provider]  memantine (NAMENDA) 5 MG tablet TAKE 1 TABLET BY MOUTH EVERYDAY AT BEDTIME Patient taking differently: Take 5 mg by mouth daily. 12/10/21   Rondel Jumbo, PA-C  omeprazole (PRILOSEC) 20 MG capsule TAKE 1 CAPSULE DAILY Patient taking differently: Take 20 mg by mouth daily. 10/20/17   Lucretia Kern, DO  oxybutynin (DITROPAN-XL) 10 MG 24 hr tablet Take 10 mg by mouth daily. 10/25/21   [provider]  oxybutynin (DITROPAN-XL) 5 MG 24 hr tablet Take 5 mg by mouth daily. 12/14/20   [provider]  polyvinyl alcohol (LIQUIFILM TEARS) 1.4 % ophthalmic solution Place 1 drop into both eyes as needed for dry eyes.    [provider]  prednisoLONE acetate (PRED FORTE) 1 % ophthalmic suspension Place 1 drop into the left eye 3 (three) times daily. Patient taking differently: Place 1 drop into the left eye in the morning and at bedtime. 06/23/21   Rennis Chris, MD      Allergies    Codeine    Review  of Systems   Review of Systems  Unable to perform ROS: Dementia  Neurological:  Positive for weakness.    Physical Exam Updated Vital Signs BP (!) 146/53   Pulse 66   Temp (!) 102.5 F (39.2 C) (Oral)   Resp 16   Ht 5\' 11"  (1.803 m)   Wt 60 kg   SpO2 95%   BMI 18.45 kg/m  Physical Exam Vitals and nursing note reviewed.  Constitutional:      General: She is not in acute distress.    Appearance: She is well-developed. She is ill-appearing.  HENT:     Head: Normocephalic and atraumatic.     Mouth/Throat:     Pharynx: No oropharyngeal exudate.  Eyes:     Conjunctiva/sclera: Conjunctivae normal.     Pupils: Pupils are equal, round, and reactive to light.  Neck:     Comments: No meningismus. Cardiovascular:     Rate and Rhythm: Normal rate and regular rhythm.     Heart sounds: Normal heart sounds. No murmur heard. Pulmonary:     Effort: Pulmonary effort is normal. No respiratory distress.     Breath sounds: Normal breath sounds.     Comments: Diminished breath sounds bilaterally Chest:     Chest wall: No tenderness.  Abdominal:     Palpations: Abdomen is soft.     Tenderness: There is no abdominal tenderness. There is no guarding or rebound.  Musculoskeletal:        General: No tenderness. Normal range of motion.     Cervical back: Normal range of motion and neck supple.  Skin:    General: Skin is warm.  Neurological:     Mental Status: She is alert.     Cranial Nerves: No cranial nerve deficit.     Motor: No abnormal muscle tone.     Coordination: Coordination normal.     Comments:  5/5 strength throughout. CN 2-12 intact.Equal grip strength.  Oriented to person and place.    Psychiatric:        Behavior: Behavior normal.     ED Results / Procedures / Treatments   Labs (all labs ordered are listed, but only abnormal results are displayed) Labs Reviewed  CBC - Abnormal; Notable for the following components:      Result Value   Hemoglobin 11.4 (*)    HCT  34.8 (*)    All other components within normal limits  COMPREHENSIVE METABOLIC PANEL - Abnormal; Notable for the following components:   Chloride 97 (*)    Creatinine, Ser 1.06 (*)    Calcium 8.7 (*)    Alkaline Phosphatase 35 (*)    GFR, Estimated 49 (*)    All other components within normal limits  APTT - Abnormal; Notable for the following components:  aPTT 22 (*)    All other components within normal limits  RESP PANEL BY RT-PCR (RSV, FLU A&B, COVID)  RVPGX2  CULTURE, BLOOD (ROUTINE X 2)  CULTURE, BLOOD (ROUTINE X 2)  URINE CULTURE  MRSA NEXT GEN BY PCR, NASAL  EXPECTORATED SPUTUM ASSESSMENT W GRAM STAIN, RFLX TO RESP C  LACTIC ACID, PLASMA  PROTIME-INR  URINALYSIS, ROUTINE W REFLEX MICROSCOPIC  LACTIC ACID, PLASMA  CK  LEGIONELLA PNEUMOPHILA SEROGP 1 UR AG  STREP PNEUMONIAE URINARY ANTIGEN  CBG MONITORING, ED  CBG MONITORING, ED  TROPONIN I (HIGH SENSITIVITY)  TROPONIN I (HIGH SENSITIVITY)    EKG EKG Interpretation  Date/Time:  Wednesday January 05 2022 13:15:38 EST Ventricular Rate:  63 PR Interval:  160 QRS Duration: 111 QT Interval:  520 QTC Calculation: 533 R Axis:   59 Text Interpretation: Sinus or ectopic atrial rhythm Atrial premature complexes Prolonged QT interval No significant change was found Confirmed by Ezequiel Essex [phone]) on 01/05/2022 1:31:17 PM  Radiology DG Chest 2 View  Result Date: 01/05/2022 CLINICAL DATA:  Shortness of breath, fever EXAM: CHEST - 2 VIEW COMPARISON:  Previous studies including the examination done on 12/24/2021 FINDINGS: Transverse diameter of heart is increased. There is interval appearance of new patchy infiltrates in right mid and right lower lung fields. There is interval appearance of small right pleural effusion. Infiltrate in right upper lung fields has not changed. Extensive pleural calcifications and pleural thickening are noted in both apices. Left lung shows no new focal infiltrates. IMPRESSION: New patchy  infiltrates are seen in right mid and right lower lung fields suggesting multifocal pneumonia. Small right pleural effusion. Infiltrate in right upper lobe has not changed significantly. Electronically Signed   By: Elmer Picker M.D.   On: 01/05/2022 13:43    Procedures .Critical Care  Performed by: Ezequiel Essex, MD Authorized by: Ezequiel Essex, MD   Critical care provider statement:    Critical care time (minutes):  60   Critical care time was exclusive of:  Separately billable procedures and treating other patients   Critical care was necessary to treat or prevent imminent or life-threatening deterioration of the following conditions:  Respiratory failure and sepsis   Critical care was time spent personally by me on the following activities:  Development of treatment plan with patient or surrogate, discussions with consultants, evaluation of patient's response to treatment, examination of patient, ordering and review of laboratory studies, ordering and review of radiographic studies, ordering and performing treatments and interventions, pulse oximetry, re-evaluation of patient's condition, review of old charts and obtaining history from patient or surrogate   I assumed direction of critical care for this patient from another provider in my specialty: no     Care discussed with: admitting provider       Medications Ordered in ED Medications  acetaminophen (TYLENOL) tablet 1,000 mg (has no administration in time range)  lactated ringers infusion (has no administration in time range)  lactated ringers bolus 1,000 mL (has no administration in time range)  vancomycin (VANCOCIN) IVPB 1000 mg/200 mL premix (has no administration in time range)  ceFEPIme (MAXIPIME) 2 g in sodium chloride 0.9 % 100 mL IVPB (has no administration in time range)    ED Course/ Medical Decision Making/ A&P                           Medical Decision Making Amount and/or Complexity of Data  Reviewed Labs: ordered. Decision-making details  documented in ED Course. Radiology: ordered and independent interpretation performed. Decision-making details documented in ED Course. ECG/medicine tests: ordered and independent interpretation performed. Decision-making details documented in ED Course.  Risk Prescription drug management. Decision regarding hospitalization.  Altered mental status, generalized weakness, cough, fever and hypoxia.  Code sepsis activated on arrival.  Patient requiring 5 L of oxygen and does not normally wear oxygen.  She is given IV fluids and antibiotics after cultures are obtained  X-ray today is concerning for right-sided multifocal pneumonia.  Results reviewed interpreted by me.  Patient remains hypoxic on 6 L nasal cannula oxygen.  Discussed with respiratory therapy will escalate to high flow nasal cannula at 10 L.  O2 saturations has improved to the high 90s.  Chest x-ray can today for right-sided multifocal pneumonia.  Patient started on broad-spectrum antibiotics after IV fluids and cultures obtained.  She denies any pain.  Lactate is normal.  Patient does not require 30 mL/kg as she has not hypotensive and does not have any lactic acidosis  Will require admission for likely hypoxic respiratory failure and sepsis from pneumonia.  Work of breathing has improved on high flow nasal cannula oxygen.  COVID and flu test are pending.  Admission discussed with Dr. Jonathon Bellows.          Final Clinical Impression(s) / ED Diagnoses Final diagnoses:  Acute hypoxic respiratory failure (HCC)  Sepsis with acute hypoxic respiratory failure without septic shock, due to unspecified organism [facility])    Rx / DC Orders ED Discharge Orders     None         Tyshaun Vinzant, Jeannett Senior, MD 01/05/22 1657

## 2022-01-05 NOTE — Hospital Course (Addendum)
89 year old female with history of dementia anxiety/depression, GERD, HLD, hypertension, anemia of chronic disease, sinus bradycardia due to beta-blocker recent admission 12/21 - 12/23 for toxic encephalopathy due to Community acquired pneumonia who lives alone brought to the ED with complaint of generalized weakness, fever and hypoxic 83% on EMS arrival, fever of 102.5.  Patient has been still on bed and too weak to get out of bed, has been exposed to RSV with family members. In the ED, vitals: Fever order 2.5 BP 140s to 160s, needing 5 L nasal cannula oxygen. Labs: Stable CBC, bmp/lactic acid> fairly normal.Troponin/blood culture/respiratory virus panel pending. Chest x-ray new patchy infiltrate seen in the right middle and right lower lung suggestive of multifocal pneumonia, small right pleural effusion, infiltrate in the right upper lobe has not changed significantly. Patient appeared to be confused with generalized weakness. Code sepsis was activated: Vancomycin, cefepime and fluid bolus was ordered.Admission was requested, she is on 6l HFNC on admission. Since admission tested positive for RSV and positive troponin level fluctuating mostly flat, no chest pain

## 2022-01-05 NOTE — Progress Notes (Signed)
Pharmacy Antibiotic Note  Tracey Rosario is a 89 y.o. female admitted on 01/05/2022 with pneumonia, code sepsis.  Pharmacy has been consulted for vancomycin and cefepime dosing.  Plan: Cefepime 2g IV q12h Vancomycin 1250mg  IV q12h for eAUC 474, SCr 1.06, Vd 0.72 Check vancomycin levels at steady state, goal AUC 400-550 Follow up renal function & cultures  Height: 5\' 11"  (180.3 cm) Weight: 60 kg (132 lb 4.4 oz) IBW/kg (Calculated) : 70.8  Temp (24hrs), Avg:100.1 F (37.8 C), Min:98.7 F (37.1 C), Max:102.5 F (39.2 C)  Recent Labs  Lab 01/05/22 1301 01/05/22 1341  WBC 6.6  --   CREATININE 1.06*  --   LATICACIDVEN  --  1.1    Estimated Creatinine Clearance: 31.4 mL/min (A) (by C-G formula based on SCr of 1.06 mg/dL (H)).    Allergies  Allergen Reactions   Codeine Other (See Comments)    Altered mental status    Antimicrobials this admission: 1/3 Vanc >> 1/3 Cefepime >>  Dose adjustments this admission:  Microbiology results: 1/3 BCx: 1/3 UCx: 1/3 Resp panel: 1/3 MRSA PCR:  Thank you for allowing pharmacy to be a part of this patient's care.  Peggyann Juba, PharmD, BCPS Pharmacy: [phone] 01/05/2022 4:27 PM

## 2022-01-05 NOTE — Progress Notes (Signed)
A consult was received from an ED physician for vanc/cefepime per pharmacy dosing.  The patient's profile has been reviewed for ht/wt/allergies/indication/available labs.   A one time order has been placed for vanc 1250mg  and cefepime 2g.  Further antibiotics/pharmacy consults should be ordered by admitting physician if indicated.                       Thank you, Kara Mead 01/05/2022  1:50 PM

## 2022-01-05 NOTE — ED Provider Triage Note (Signed)
Emergency Medicine Provider Triage Evaluation Note  Tracey Rosario , a 89 y.o. female  was evaluated in triage.  Patient lives at home alone.  Does not require oxygen.  Over the past 24 hours she has been very weak, developed fevers and has not been able to take care of herself like she usually can.  Questionable RSV exposure over the holidays.     Patient satting in the upper 80s on room air, does not usually require oxygen.  Per EMS saturations dropped to the low 80s on room air PTA   Review of Systems  Positive:  Negative:   Physical Exam  There were no vitals taken for this visit. Gen:   Awake, no distress   Resp:  Normal effort  MSK:   Moves extremities without difficulty  Other:  Clear  Medical Decision Making  Medically screening exam initiated at 1:01 PM.  Appropriate orders placed.  Edson Snowball was informed that the remainder of the evaluation will be completed by another provider, this initial triage assessment does not replace that evaluation, and the importance of remaining in the ED until their evaluation is complete.     Rhae Hammock, PA-C 01/06/22 1718

## 2022-01-06 ENCOUNTER — Other Ambulatory Visit: Payer: Self-pay

## 2022-01-06 ENCOUNTER — Inpatient Hospital Stay (HOSPITAL_COMMUNITY): Payer: Medicare Other

## 2022-01-06 ENCOUNTER — Encounter (HOSPITAL_COMMUNITY): Payer: Self-pay | Admitting: Internal Medicine

## 2022-01-06 DIAGNOSIS — Z7189 Other specified counseling: Secondary | ICD-10-CM

## 2022-01-06 DIAGNOSIS — J9601 Acute respiratory failure with hypoxia: Secondary | ICD-10-CM

## 2022-01-06 DIAGNOSIS — R451 Restlessness and agitation: Secondary | ICD-10-CM | POA: Diagnosis not present

## 2022-01-06 DIAGNOSIS — A419 Sepsis, unspecified organism: Secondary | ICD-10-CM | POA: Diagnosis not present

## 2022-01-06 DIAGNOSIS — R0602 Shortness of breath: Secondary | ICD-10-CM

## 2022-01-06 DIAGNOSIS — R7989 Other specified abnormal findings of blood chemistry: Secondary | ICD-10-CM

## 2022-01-06 DIAGNOSIS — Z66 Do not resuscitate: Secondary | ICD-10-CM

## 2022-01-06 DIAGNOSIS — Z515 Encounter for palliative care: Secondary | ICD-10-CM

## 2022-01-06 DIAGNOSIS — I2489 Other forms of acute ischemic heart disease: Secondary | ICD-10-CM

## 2022-01-06 DIAGNOSIS — R6520 Severe sepsis without septic shock: Secondary | ICD-10-CM | POA: Diagnosis not present

## 2022-01-06 DIAGNOSIS — Z79899 Other long term (current) drug therapy: Secondary | ICD-10-CM

## 2022-01-06 DIAGNOSIS — R4589 Other symptoms and signs involving emotional state: Secondary | ICD-10-CM

## 2022-01-06 LAB — ECHOCARDIOGRAM COMPLETE
AR max vel: 2.75 cm2
AV Area VTI: 2.26 cm2
AV Area mean vel: 2.7 cm2
AV Mean grad: 7 mmHg
AV Peak grad: 13.5 mmHg
Ao pk vel: 1.84 m/s
Area-P 1/2: 3.37 cm2
Calc EF: 69.6 %
Height: 71 in
S' Lateral: 2.7 cm
Single Plane A2C EF: 70.2 %
Single Plane A4C EF: 68.5 %
Weight: 2116.42 oz

## 2022-01-06 LAB — MRSA NEXT GEN BY PCR, NASAL: MRSA by PCR Next Gen: NOT DETECTED

## 2022-01-06 LAB — LEGIONELLA PNEUMOPHILA SEROGP 1 UR AG: L. pneumophila Serogp 1 Ur Ag: NEGATIVE

## 2022-01-06 LAB — BASIC METABOLIC PANEL
Anion gap: 7 (ref 5–15)
BUN: 21 mg/dL (ref 8–23)
CO2: 25 mmol/L (ref 22–32)
Calcium: 7.8 mg/dL — ABNORMAL LOW (ref 8.9–10.3)
Chloride: 99 mmol/L (ref 98–111)
Creatinine, Ser: 1.03 mg/dL — ABNORMAL HIGH (ref 0.44–1.00)
GFR, Estimated: 51 mL/min — ABNORMAL LOW (ref >60)
Glucose, Bld: 119 mg/dL — ABNORMAL HIGH (ref 70–99)
Potassium: 3.6 mmol/L (ref 3.5–5.1)
Sodium: 131 mmol/L — ABNORMAL LOW (ref 135–145)

## 2022-01-06 LAB — URINE CULTURE

## 2022-01-06 LAB — TROPONIN I (HIGH SENSITIVITY): Troponin I (High Sensitivity): 174 ng/L (ref <18)

## 2022-01-06 LAB — CBC
HCT: 27.7 % — ABNORMAL LOW (ref 36.0–46.0)
Hemoglobin: 9 g/dL — ABNORMAL LOW (ref 12.0–15.0)
MCH: 28.8 pg (ref 26.0–34.0)
MCHC: 32.5 g/dL (ref 30.0–36.0)
MCV: 88.5 fL (ref 80.0–100.0)
Platelets: 200 10*3/uL (ref 150–400)
RBC: 3.13 MIL/uL — ABNORMAL LOW (ref 3.87–5.11)
RDW: 14 % (ref 11.5–15.5)
WBC: 6.7 10*3/uL (ref 4.0–10.5)
nRBC: 0 % (ref 0.0–0.2)

## 2022-01-06 LAB — BRAIN NATRIURETIC PEPTIDE: B Natriuretic Peptide: 443.3 pg/mL — ABNORMAL HIGH (ref 0.0–100.0)

## 2022-01-06 MED ORDER — LORAZEPAM 2 MG/ML IJ SOLN
0.5000 mg | INTRAMUSCULAR | Status: DC | PRN
  Administered 2022-01-06 – 2022-01-07 (×5): 0.5 mg via INTRAVENOUS
  Filled 2022-01-06 (×5): qty 1

## 2022-01-06 MED ORDER — FUROSEMIDE 10 MG/ML IJ SOLN
20.0000 mg | Freq: Every day | INTRAMUSCULAR | Status: DC
  Administered 2022-01-06 – 2022-01-14 (×9): 20 mg via INTRAVENOUS
  Filled 2022-01-06 (×3): qty 2
  Filled 2022-01-06: qty 4
  Filled 2022-01-06 (×5): qty 2

## 2022-01-06 MED ORDER — HYDROMORPHONE HCL 1 MG/ML IJ SOLN: 0.2000 mg | INTRAMUSCULAR | Status: DC | PRN

## 2022-01-06 MED ORDER — FUROSEMIDE 10 MG/ML IJ SOLN
20.0000 mg | Freq: Once | INTRAMUSCULAR | Status: AC
  Administered 2022-01-06: 20 mg via INTRAVENOUS
  Filled 2022-01-06: qty 4

## 2022-01-06 MED ORDER — HYDROMORPHONE HCL 1 MG/ML IJ SOLN
0.2000 mg | INTRAMUSCULAR | Status: DC | PRN
  Administered 2022-01-06 – 2022-01-07 (×5): 0.2 mg via INTRAVENOUS
  Filled 2022-01-06 (×3): qty 0.5
  Filled 2022-01-06: qty 1
  Filled 2022-01-06: qty 0.5

## 2022-01-06 MED ORDER — IPRATROPIUM-ALBUTEROL 0.5-2.5 (3) MG/3ML IN SOLN
3.0000 mL | Freq: Four times a day (QID) | RESPIRATORY_TRACT | Status: DC
  Administered 2022-01-07 – 2022-01-09 (×8): 3 mL via RESPIRATORY_TRACT
  Filled 2022-01-06 (×8): qty 3

## 2022-01-06 MED ORDER — PERFLUTREN LIPID MICROSPHERE
1.0000 mL | INTRAVENOUS | Status: AC | PRN
  Administered 2022-01-06: 3 mL via INTRAVENOUS

## 2022-01-06 MED ORDER — ACETAMINOPHEN 160 MG/5ML PO SOLN
650.0000 mg | Freq: Four times a day (QID) | ORAL | Status: DC | PRN
  Administered 2022-01-06 – 2022-01-07 (×2): 650 mg via ORAL
  Filled 2022-01-06 (×2): qty 20.3

## 2022-01-06 MED ORDER — LORAZEPAM 2 MG/ML PO CONC
0.5000 mg | ORAL | Status: DC | PRN
  Filled 2022-01-06: qty 0.25

## 2022-01-06 NOTE — Progress Notes (Signed)
PROGRESS NOTE Takeila Thayne  RJJ:[id number] DOB: 1926/08/05 DOA: 01/05/2022 PCP: Courtney Paris, NP  Brief Narrative/Hospital Course: 89 year old female with history of dementia anxiety/depression, GERD, HLD, hypertension, anemia of chronic disease, sinus bradycardia due to beta-blocker recent admission 12/21 - 12/23 for toxic encephalopathy due to Community acquired pneumonia who lives alone brought to the ED with complaint of generalized weakness, fever and hypoxic 83% on EMS arrival, fever of 102.5.  Patient has been still on bed and too weak to get out of bed, has been exposed to RSV with family members. In the ED, vitals: Fever order 2.5 BP 140s to 160s, needing 5 L nasal cannula oxygen. Labs: Stable CBC, bmp/lactic acid> fairly normal.Troponin/blood culture/respiratory virus panel pending. Chest x-ray new patchy infiltrate seen in the right middle and right lower lung suggestive of multifocal pneumonia, small right pleural effusion, infiltrate in the right upper lobe has not changed significantly. Patient appeared to be confused with generalized weakness. Code sepsis was activated: Vancomycin, cefepime and fluid bolus was ordered.Admission was requested, she is on 6l HFNC on admission. Since admission tested positive for RSV and positive troponin level fluctuating mostly flat, no chest pain    Subjective: Seen and examined this morning patient is hard of hearing but appears comfortable daughter at the bedside. Overnight labs reviewed hemoglobin drifting 9.0 g from 11.4, stable renal function troponin 152>134-125-174. She has been placed on 10 L and subsequently nonrebreather mask overnight Chest x-ray BNP ordered this morning> shows worsening opacity in the right lung Later on patient was anxious and shaky> and palliative care saw the patient  Assessment and Plan: Principal Problem:   Severe sepsis [facility]) Active Problems:   Palliative care encounter   Acute hypoxic respiratory  failure (HCC)   Goals of care, counseling/discussion   DNR (do not resuscitate)   Shortness of breath   High risk medication use   Agitation   Sepsis with acute hypoxic respiratory failure without septic shock (HCC)   Need for emotional support   Counseling and coordination of care   Multifocal pneumonia possible healthcare associated Acute hypoxic respiratory failure Severe sepsis POA Acute RSV infection: Chest x-ray with multifocal pneumonia RML and RLL, lactic acid blood work fairly stable.  Vital stable although patient has been more hypoxic overnight.  Tested positive for RSV.  Will follow chest x-ray, continue broad-spectrum antibiotics vancomycin/cefepime, follow-up culture data urine antigen sputum culture.  Chest x-ray shows worsening opacity in the right lung.  With worsening respiratory status overall prognosis does not appear bright -I shared my concern with patient's daughter at the bedside encouraged to discuss DNR, if worsens further she would benefit with hospice evaluation. PMT consulted. Will do Lasix x 1.  Later on palliative care meeting was held and patient changed to DNR.  Patient BNP is elevated at 443 will continue on diuretics as well Recent Labs  Lab 01/05/22 1301 01/05/22 1341 01/06/22 0211  WBC 6.6  --  6.7  LATICACIDVEN  --  1.1  --     Demand ischemia with elevated troponin EKG no acute ST-T wave changes.  Echocardiogram obtained showed EF 65 to 70%, G2 DD, no RWMA significant calcification at the noncoronary cusp, mitral valve normal.   Acute on chronic diastolic CHF with grade 2 DD.  BNP elevated 443 continue gentle Lasix as blood pressure tolerates.  Mild Dementia:Continue Namenda/Aricept. Anxiety/depression: Mood stable,Continue home Celexa slacks Neurontin GERD: Continue PPI HLD: cont statin. stable CK Hypothyroidism: cont thyroid Hypertension: BP at this time well-controlled,  continue holding lisinopril/Amlodipine Anemia of chronic disease:hb  stable> drifting downward monitor Generalized weakness: PTOT  eval as able Goals of care currently full code overall prognosis does not appear bright palliative care consult has been requested.  Patient with worsening pneumonia appears severely frail more hypoxic, she is at risk of decompensation, communicated with patient's daughter at the bedside encouraged for DNR.  She is going to call her niece and other family members, she is leaning towards DNR and understands overall poor prognosis.  DVT prophylaxis: enoxaparin (LOVENOX) injection 40 mg Start: 01/05/22 2200 SCDs Start: 01/05/22 1622 Code Status:   Code Status: DNR Family Communication: plan of care discussed with patient/daughter at bedside. Patient status is: Inpatient because of worsening respiratory status Level of care: Progressive   Dispo: The patient is from: Home            Anticipated disposition: TBD Objective: Vitals last 24 hrs: Vitals:   01/06/22 0752 01/06/22 0801 01/06/22 0951 01/06/22 1000  BP:  (!) 137/45  (!) 149/87  Pulse:  (!) 58  69  Resp:    (!) 21  Temp:   99.4 F (37.4 C)   TempSrc:   Oral   SpO2: 92% 93%  (!) 89%  Weight:      Height:       Weight change:   Physical Examination: General exam: alert awake, hard of hearing, elderly pleasant frail, older than stated age HEENT:Oral mucosa moist, Ear/Nose WNL grossly Respiratory system: bilaterally diminished breath sounds worse on right lung, mild use of accessory muscle on nonrebreather Cardiovascular system: S1 & S2 +, No JVD. Gastrointestinal system: Abdomen soft,NT,ND, BS+ Nervous System:Alert, awake. Extremities: LE edema neg,distal peripheral pulses palpable.  Skin: No rashes,no icterus. MSK: small muscle bulk,tone, power  Medications reviewed:  Scheduled Meds:  aspirin EC  81 mg Oral QHS   citalopram  20 mg Oral Daily   donepezil  23 mg Oral Daily   enoxaparin (LOVENOX) injection  40 mg Subcutaneous Q24H   ferrous gluconate  324 mg Oral  Daily   gabapentin  300 mg Oral TID   ipratropium-albuterol  3 mL Nebulization Q6H   levothyroxine  75 mcg Oral Q0600   memantine  5 mg Oral QPM   oxybutynin  10 mg Oral Daily   pantoprazole  40 mg Oral Daily   Continuous Infusions:  ceFEPime (MAXIPIME) IV Stopped (01/06/22 0414)   [START ON 01/07/2022] vancomycin        Diet Order             DIET SOFT Room service appropriate? Yes; Fluid consistency: Thin  Diet effective now                  Intake/Output Summary (Last 24 hours) at 01/06/2022 1301 Last data filed at 01/05/2022 1801 Gross per 24 hour  Intake 1412.87 ml  Output --  Net 1412.87 ml   Net IO Since Admission: 1,412.87 mL [01/06/22 1301]  Wt Readings from Last 3 Encounters:  01/05/22 60 kg  12/23/21 60.8 kg  11/17/21 62.1 kg     Unresulted Labs (From admission, onward)     Start     Ordered   01/12/22 0500  Creatinine, serum  (enoxaparin (LOVENOX)    CrCl >/= 30 ml/min)  Weekly,   R     Comments: while on enoxaparin therapy    01/05/22 1621   01/06/22 0500  Basic metabolic panel  Daily,   R      01/05/22  1642   01/06/22 0500  CBC  Daily,   R      01/05/22 1642   01/05/22 1644  Expectorated Sputum Assessment w Gram Stain, Rflx to Resp Cult  Once,   R        01/05/22 1643   01/05/22 1644  Legionella Pneumophila Serogp 1 Ur Ag  Once,   R        01/05/22 1643   01/05/22 1341  Urine Culture  (Septic presentation on arrival (screening labs, nursing and treatment orders for obvious sepsis))  ONCE - URGENT,   URGENT       Question:  Indication  Answer:  Dysuria   01/05/22 1341          Data Reviewed: I have personally reviewed following labs and imaging studies CBC: Recent Labs  Lab 01/05/22 1301 01/06/22 0211  WBC 6.6 6.7  HGB 11.4* 9.0*  HCT 34.8* 27.7*  MCV 88.8 88.5  PLT 270 200   Basic Metabolic Panel: Recent Labs  Lab 01/05/22 1301 01/06/22 0211  NA 135 131*  K 4.2 3.6  CL 97* 99  CO2 27 25  GLUCOSE 97 119*  BUN 19 21   CREATININE 1.06* 1.03*  CALCIUM 8.7* 7.8*   GFR: Estimated Creatinine Clearance: 32.3 mL/min (A) (by C-G formula based on SCr of 1.03 mg/dL (H)). Liver Function Tests: Recent Labs  Lab 01/05/22 1301  AST 23  ALT 14  ALKPHOS 35*  BILITOT 0.5  PROT 7.4  ALBUMIN 3.6  Coagulation Profile: Recent Labs  Lab 01/05/22 1341  INR 1.1   Recent Labs  Lab 01/05/22 1447  GLUCAP 99   Recent Labs  Lab 01/05/22 1341  LATICACIDVEN 1.1   Recent Results (from the past 240 hour(s))  Resp panel by RT-PCR (RSV, Flu A&B, Covid) Anterior Nasal Swab     Status: Abnormal   Collection Time: 01/05/22  1:01 PM   Specimen: Anterior Nasal Swab  Result Value Ref Range Status   SARS Coronavirus 2 by RT PCR NEGATIVE NEGATIVE Final    Comment: (NOTE) SARS-CoV-2 target nucleic acids are NOT DETECTED.  The SARS-CoV-2 RNA is generally detectable in upper respiratory specimens during the acute phase of infection. The lowest concentration of SARS-CoV-2 viral copies this assay can detect is 138 copies/mL. A negative result does not preclude SARS-Cov-2 infection and should not be used as the sole basis for treatment or other patient management decisions. A negative result may occur with  improper specimen collection/handling, submission of specimen other than nasopharyngeal swab, presence of viral mutation(s) within the areas targeted by this assay, and inadequate number of viral copies(<138 copies/mL). A negative result must be combined with clinical observations, patient history, and epidemiological information. The expected result is Negative.  Fact Sheet for Patients:  BloggerCourse.comhttps://www.fda.gov/media/152166/download  Fact Sheet for Healthcare Providers:  SeriousBroker.ithttps://www.fda.gov/media/152162/download  This test is no t yet approved or cleared by the [place] FDA and  has been authorized for detection and/or diagnosis of SARS-CoV-2 by FDA under an Emergency Use Authorization (EUA). This EUA will  remain  in effect (meaning this test can be used) for the duration of the COVID-19 declaration under Section 564(b)(1) of the Act, 21 U.S.C.section 360bbb-3(b)(1), unless the authorization is terminated  or revoked sooner.       Influenza A by PCR NEGATIVE NEGATIVE Final   Influenza B by PCR NEGATIVE NEGATIVE Final    Comment: (NOTE) The Xpert Xpress SARS-CoV-2/FLU/RSV plus assay is intended as an aid in the  diagnosis of influenza from Nasopharyngeal swab specimens and should not be used as a sole basis for treatment. Nasal washings and aspirates are unacceptable for Xpert Xpress SARS-CoV-2/FLU/RSV testing.  Fact Sheet for Patients: BloggerCourse.comhttps://www.fda.gov/media/152166/download  Fact Sheet for Healthcare Providers: SeriousBroker.ithttps://www.fda.gov/media/152162/download  This test is not yet approved or cleared by the [place] FDA and has been authorized for detection and/or diagnosis of SARS-CoV-2 by FDA under an Emergency Use Authorization (EUA). This EUA will remain in effect (meaning this test can be used) for the duration of the COVID-19 declaration under Section 564(b)(1) of the Act, 21 U.S.C. section 360bbb-3(b)(1), unless the authorization is terminated or revoked.     Resp Syncytial Virus by PCR POSITIVE (A) NEGATIVE Final    Comment: (NOTE) Fact Sheet for Patients: BloggerCourse.comhttps://www.fda.gov/media/152166/download  Fact Sheet for Healthcare Providers: SeriousBroker.ithttps://www.fda.gov/media/152162/download  This test is not yet approved or cleared by the [place] FDA and has been authorized for detection and/or diagnosis of SARS-CoV-2 by FDA under an Emergency Use Authorization (EUA). This EUA will remain in effect (meaning this test can be used) for the duration of the COVID-19 declaration under Section 564(b)(1) of the Act, 21 U.S.C. section 360bbb-3(b)(1), unless the authorization is terminated or revoked.  Performed at [facility], [address] [place], [place] [address]   Blood Culture (routine x 2)     Status: None (Preliminary result)   Collection Time: 01/05/22  1:41 PM   Specimen: BLOOD  Result Value Ref Range Status   Specimen Description   Final    BLOOD SITE NOT SPECIFIED Performed at [facility], [address] [place], [place] [address]    Special Requests   Final    BOTTLES DRAWN AEROBIC AND ANAEROBIC Blood Culture results may not be optimal due to an inadequate volume of blood received in culture bottles Performed at [facility], [address] [place], [place] [address]    Culture   Final    NO GROWTH < 24 HOURS Performed at [facility], [address] [place], [place] [address]    Report Status PENDING  Incomplete  Blood Culture (routine x 2)     Status: None (Preliminary result)   Collection Time: 01/05/22  3:29 PM   Specimen: BLOOD  Result Value Ref Range Status   Specimen Description   Final    BLOOD LEFT ANTECUBITAL Performed at [facility], [address] [place], [place] [address]    Special Requests   Final    BOTTLES DRAWN AEROBIC AND ANAEROBIC Blood Culture adequate volume Performed at [facility], [address] [place], [place] [address]    Culture   Final    NO GROWTH < 24 HOURS Performed at [facility], [address] [place], [place] [address]    Report Status PENDING  Incomplete  MRSA Next Gen by PCR, Nasal     Status: None   Collection Time: 01/05/22 11:49 PM  Result Value Ref Range Status   MRSA by PCR Next Gen NOT DETECTED NOT DETECTED Final    Comment: (NOTE) The GeneXpert MRSA Assay (FDA approved for NASAL specimens only), is one component of a comprehensive MRSA colonization surveillance program. It is not intended to diagnose MRSA infection nor to guide or monitor treatment for MRSA infections. Test performance is not FDA approved in patients less than 87 years old. Performed at [facility], [address] [place], [place] [address]  Antimicrobials: Anti-infectives (From admission, onward)    Start     Dose/Rate Route Frequency Ordered Stop   01/07/22 1600  vancomycin (VANCOREADY) IVPB 1250 mg/250 mL        1,250 mg 166.7 mL/hr over 90 Minutes Intravenous Every 48 hours 01/05/22 1630     01/06/22 0400  ceFEPIme (MAXIPIME) 2 g in sodium chloride 0.9 % 100 mL IVPB        2 g 200 mL/hr over 30 Minutes Intravenous Every 12 hours 01/05/22 1631     01/05/22 1400  vancomycin (VANCOREADY) IVPB 1250 mg/250 mL        1,250 mg 166.7 mL/hr over 90 Minutes Intravenous  Once 01/05/22 1351 01/05/22 1801   01/05/22 1345  vancomycin (VANCOCIN) IVPB 1000 mg/200 mL premix  Status:  Discontinued        1,000 mg 200 mL/hr over 60 Minutes Intravenous  Once 01/05/22 1341 01/05/22 1351   01/05/22 1345  ceFEPIme (MAXIPIME) 2 g in sodium chloride 0.9 % 100 mL IVPB        2 g 200 mL/hr over 30 Minutes Intravenous  Once 01/05/22 1341 01/05/22 1610      Culture/Microbiology    Component Value Date/Time   SDES  01/05/2022 1529    BLOOD LEFT ANTECUBITAL Performed at [facility], [place] [address], [place], [place] [address]    SPECREQUEST  01/05/2022 1529    BOTTLES DRAWN AEROBIC AND ANAEROBIC Blood Culture adequate volume Performed at [facility] [address], [place], [place] [address]    CULT  01/05/2022 1529    NO GROWTH < 24 HOURS Performed at [facility] [address] [place], [place] [address]    REPTSTATUS PENDING 01/05/2022 1529  Radiology Studies: ECHOCARDIOGRAM COMPLETE  Result Date: 01/06/2022    ECHOCARDIOGRAM REPORT   Patient Name:   ANJELITA SHEAHAN Boening Date of Exam: 01/06/2022 Medical Rec #:  [id number]                 Height:       71.0 in Accession #:    [id number]                Weight:       132.3 lb Date of Birth:  1926/04/19                 BSA:          1.769 m Patient Age:    87 years                   BP:           137/45 mmHg Patient Gender: F                         HR:           61 bpm. Exam Location:  Inpatient Procedure: 2D Echo, Cardiac Doppler, Color Doppler and Intracardiac            Opacification Agent Indications:    Elevated Troponin  History:        Patient has prior history of Echocardiogram examinations, most                 recent 05/29/2019. Arrythmias:Sinus bradycardia due to                 beta-blocker; Risk Factors:Dyslipidemia and Hypertension.  Chronic anemia. Acute hypoxic respiratory failure, pneumonia.                 Chronic kidney disease.  Sonographer:    Darlina Sicilian RDCS Referring Phys: [id number] [facility] IMPRESSIONS  1. Left ventricular ejection fraction, by estimation, is 65 to 70%. The left ventricle has normal function. The left ventricle has no regional wall motion abnormalities. There is mild left ventricular hypertrophy. Left ventricular diastolic parameters are consistent with Grade II diastolic dysfunction (pseudonormalization). Elevated left atrial pressure.  2. Right ventricular systolic function is normal. The right ventricular size is normal. There is mildly elevated pulmonary artery systolic pressure. The estimated right ventricular systolic pressure is 02.7 mmHg.  3. Left atrial size was mildly dilated.  4. Right atrial size was mildly dilated.  5. The mitral valve is normal in structure. Mild mitral valve regurgitation.  6. Significant calcification at the non-coronary cusp. . The aortic valve was not well visualized. Aortic valve regurgitation is trivial. Aortic valve sclerosis/calcification is present, without any evidence of aortic stenosis. FINDINGS  Left Ventricle: Left ventricular ejection fraction, by estimation, is 65 to 70%. The left ventricle has normal function. The left ventricle has no regional wall motion abnormalities. Definity contrast agent was given IV to delineate the left ventricular  endocardial borders. The left  ventricular internal cavity size was normal in size. There is mild left ventricular hypertrophy. Left ventricular diastolic parameters are consistent with Grade II diastolic dysfunction (pseudonormalization). Elevated left atrial pressure. Right Ventricle: The right ventricular size is normal. Right vetricular wall thickness was not well visualized. Right ventricular systolic function is normal. There is mildly elevated pulmonary artery systolic pressure. The tricuspid regurgitant velocity  is 2.91 m/s, and with an assumed right atrial pressure of 3 mmHg, the estimated right ventricular systolic pressure is 25.3 mmHg. Left Atrium: Left atrial size was mildly dilated. Right Atrium: Right atrial size was mildly dilated. Pericardium: Trivial pericardial effusion is present. Mitral Valve: The mitral valve is normal in structure. Mild mitral valve regurgitation. Tricuspid Valve: The tricuspid valve is normal in structure. Tricuspid valve regurgitation is mild . No evidence of tricuspid stenosis. Aortic Valve: Significant calcification at the non-coronary cusp. The aortic valve was not well visualized. Aortic valve regurgitation is trivial. Aortic valve sclerosis/calcification is present, without any evidence of aortic stenosis. Aortic valve mean  gradient measures 7.0 mmHg. Aortic valve peak gradient measures 13.5 mmHg. Aortic valve area, by VTI measures 2.26 cm. Pulmonic Valve: The pulmonic valve was grossly normal. Pulmonic valve regurgitation is not visualized. No evidence of pulmonic stenosis. Aorta: The aortic root and ascending aorta are structurally normal, with no evidence of dilitation. IAS/Shunts: The interatrial septum was not well visualized.  LEFT VENTRICLE PLAX 2D LVIDd:         4.30 cm     Diastology LVIDs:         2.70 cm     LV e' medial:    7.29 cm/s LV PW:         1.00 cm     LV E/e' medial:  18.7 LV IVS:        1.20 cm     LV e' lateral:   8.49 cm/s LVOT diam:     2.10 cm     LV E/e' lateral: 16.0 LV  SV:         99 LV SV Index:   56 LVOT Area:     3.46 cm  LV Volumes (MOD) LV vol  d, MOD A2C: 93.6 ml LV vol d, MOD A4C: 87.1 ml LV vol s, MOD A2C: 27.9 ml LV vol s, MOD A4C: 27.4 ml LV SV MOD A2C:     65.7 ml LV SV MOD A4C:     87.1 ml LV SV MOD BP:      63.7 ml RIGHT VENTRICLE RV S prime:     17.40 cm/s TAPSE (M-mode): 2.6 cm LEFT ATRIUM             Index        RIGHT ATRIUM           Index LA diam:        4.00 cm 2.26 cm/m   RA Area:     21.80 cm LA Vol (A2C):   58.0 ml 32.79 ml/m  RA Volume:   62.10 ml  35.11 ml/m LA Vol (A4C):   62.3 ml 35.22 ml/m LA Biplane Vol: 62.3 ml 35.22 ml/m  AORTIC VALVE AV Area (Vmax):    2.75 cm AV Area (Vmean):   2.70 cm AV Area (VTI):     2.26 cm AV Vmax:           184.00 cm/s AV Vmean:          122.000 cm/s AV VTI:            0.438 m AV Peak Grad:      13.5 mmHg AV Mean Grad:      7.0 mmHg LVOT Vmax:         146.00 cm/s LVOT Vmean:        95.000 cm/s LVOT VTI:          0.286 m LVOT/AV VTI ratio: 0.65  AORTA Ao Root diam: 2.80 cm Ao Asc diam:  3.10 cm MITRAL VALVE                TRICUSPID VALVE MV Area (PHT): 3.37 cm     TR Peak grad:   33.9 mmHg MV Decel Time: 225 msec     TR Vmax:        291.00 cm/s MV E velocity: 136.00 cm/s MV A velocity: 74.40 cm/s   SHUNTS MV E/A ratio:  1.83         Systemic VTI:  0.29 m                             Systemic Diam: 2.10 cm Epifanio Lesches MD Electronically signed by Epifanio Lesches MD Signature Date/Time: 01/06/2022/10:35:43 AM    Final    DG Chest Port 1 View  Result Date: 01/06/2022 CLINICAL DATA:  Pneumonia. EXAM: PORTABLE CHEST 1 VIEW COMPARISON:  January 05, 2022. FINDINGS: Stable cardiomegaly. Stable pleural calcifications are noted. No acute abnormality seen in the left lung. Increased right upper and lower lobe opacities are noted concerning for pneumonia. Bony thorax is unremarkable. IMPRESSION: Increased right lung opacities are noted concerning for pneumonia. Electronically Signed   By: Lupita Raider M.D.   On:  01/06/2022 09:21   DG Chest 2 View  Result Date: 01/05/2022 CLINICAL DATA:  Shortness of breath, fever EXAM: CHEST - 2 VIEW COMPARISON:  Previous studies including the examination done on 12/24/2021 FINDINGS: Transverse diameter of heart is increased. There is interval appearance of new patchy infiltrates in right mid and right lower lung fields. There is interval appearance of small right pleural effusion. Infiltrate in right upper lung fields has not changed. Extensive  pleural calcifications and pleural thickening are noted in both apices. Left lung shows no new focal infiltrates. IMPRESSION: New patchy infiltrates are seen in right mid and right lower lung fields suggesting multifocal pneumonia. Small right pleural effusion. Infiltrate in right upper lobe has not changed significantly. Electronically Signed   By: Ernie Avena M.D.   On: 01/05/2022 13:43     LOS: 1 day   Lanae Boast, MD [facility]  01/06/2022, 1:01 PM

## 2022-01-06 NOTE — ED Notes (Signed)
Pt unable to have oral temp checked due to oxygen requirements. Pt does not want rectal temp

## 2022-01-06 NOTE — Consult Note (Addendum)
Consultation Note Date: 01/06/2022   Patient Name: Tracey Rosario  DOB: 03/19/1926  MRN: [id number]  Age / Sex: 89 y.o., female   PCP: Simona Huh, NP Referring Physician: Antonieta Pert, MD  Reason for Consultation: Establishing goals of care     Chief Complaint/History of Present Illness:   Patient is a 89 year old female with a past medical history of dementia, anxiety/depression, GERD, hyperlipidemia, hypertension, anemia of chronic disease, and recent admission 12/21-23 for toxic encephalopathy due to CAP who was admitted on 01/05/2022 for management of weakness, fever, and hypoxia.  As per EMR review, patient's family reported that patient had been too weak to get out of bed and had been exposed to RSV with family members.  Since admission, patient receiving workup for acute hypoxic respiratory failure in the setting of multifocal pneumonia healthcare associated due to recent admission and RSV positive.  Palliative medicine team consulted to assist with complex medical decision-making.  Discussed care with hospitalist prior to seeing patient.   Presented to bedside. Patient seen laying in bed with uncontrollable tremors. Patient denied being cold, just anxious. Patient's daughter at bedside, Tracey Rosario. Introduced myself and the palliative medicine team. Though patient able to participate in some conversation, she is working very hard to breath and O2 saturation is decreasing into low 80's. Daughter updated about patient's worsening status yesterday. Recently hospitalization for PNA. Discussed concerns on how critically ill patient is currently. Daughter acknowledged this. Discuss medical pathways for care moving forward. Discussed continuing with appropriate medical interventions at this time such as antibiotics. Discussed concern that if patient's respiratory status continues to worsen to the point her heart stops or she stops breathing, interventions such as CPR and intubation would  likely not return patient to good quality of life and instead care harm. Daughter agreeing with code status change to DNR/DNI.  Also discussed adding medications for dyspnea and anxiety. Discussed risks and benefits of medications and that patient may become sleepy. Daughter agreeing with use of these medications for comfort.  Also discussed that if patient respiratory status continues to worsen, will need to consider transitioning to full comfort care. At this time, allowing time though will continue conversations based on patient's status. Daughter agreeing with this plan.  Tracey Rosario reports she is patient's HCPOA. Her sister is patient's only other child and she is bedridden. Tracey's has been communicating with niece instead.   All questions answered at that time. Thanked her for allowing for discussions today.  Updated care team regarding conversations.   Primary Diagnoses  Present on Admission:  Severe sepsis ([place])   Palliative Review of Systems: Patient anxious with tremors and greatly increased work of breathing.   Past Medical History:  Diagnosis Date   Acid reflux    Anemia    Arthropathy    Bradycardia    beta blocker reduced in 2021 due to this   CKD (chronic kidney disease), stage III (HCC)    Cognitive decline    referred to neurologist   Depression 10/07/2013   daughter didn't know anything about this   Dizziness    Frequent PVCs    GERD (gastroesophageal reflux disease) 05/04/2013   Hip fx ([place])    r hip, bilat pubis rami   Hyperlipemia    Hypertension    Hypertensive retinopathy    OU   Hypothyroidism    Insomnia    Macular degeneration    Exu OS   Multilevel degenerative disc disease    Palpitations 05/04/2013  saw cardiologist in 2015, he deleted A. Fib from the record as per note not documentation to support   Premature atrial contractions    PSVT (paroxysmal supraventricular tachycardia)    a. short runs by monitor in 2021.   Short-term memory loss     Vitamin D deficiency    Social History   Socioeconomic History   Marital status: Widowed    Spouse name: Not on file   Number of children: 2   Years of education: 49   Highest education level: High school graduate  Occupational History   Occupation: Retired  Tobacco Use   Smoking status: Former    Types: Cigarettes    Quit date: 08/02/1970    Years since quitting: 51.4   Smokeless tobacco: Never   Tobacco comments:    "smoked very little"  Vaping Use   Vaping Use: Never used  Substance and Sexual Activity   Alcohol use: Never    Alcohol/week: 0.0 standard drinks of alcohol   Drug use: Never   Sexual activity: Not Currently  Other Topics Concern   Not on file  Social History Narrative   Work or School: none      Home Situation: lives alone, daughter lives about 5 minutes away. One daughter lives across the street (bedridden)      Spiritual Beliefs: [employer]      Lifestyle: no regular exercise, diet is ok      3 grand children and 3 great grands; 2 1/2 and 3 months       Caffeine: 1-2 cups of coffee/day   Right handed   retired   International aid/development worker of [employer] Strain: Not on file  Food Insecurity: No Food Insecurity (12/23/2021)   Hunger Vital Sign    Worried About Running Out of Food in the Last Year: Never true    Ran Out of Food in the Last Year: Never true  Transportation Needs: No Transportation Needs (12/23/2021)   PRAPARE - Administrator, Civil Service (Medical): No    Lack of Transportation (Non-Medical): No  Physical Activity: Not on file  Stress: Not on file  Social Connections: Not on file   Family History  Problem Relation Age of Onset   Prostate cancer Father    Heart attack Sister    Cancer Brother    Cancer Sister    Heart attack Sister    Cancer Sister    Stroke Daughter    Dementia Neg Hx    Alzheimer's disease Neg Hx    Scheduled Meds:  aspirin EC  81 mg Oral QHS   citalopram  20 mg Oral Daily    donepezil  23 mg Oral Daily   enoxaparin (LOVENOX) injection  40 mg Subcutaneous Q24H   ferrous gluconate  324 mg Oral Daily   furosemide  20 mg Intravenous Once   gabapentin  300 mg Oral TID   ipratropium-albuterol  3 mL Nebulization Q6H   levothyroxine  75 mcg Oral Q0600   memantine  5 mg Oral QPM   oxybutynin  10 mg Oral Daily   pantoprazole  40 mg Oral Daily   Continuous Infusions:  ceFEPime (MAXIPIME) IV Stopped (01/06/22 0414)   [START ON 01/07/2022] vancomycin     PRN Meds:.acetaminophen **OR** acetaminophen, albuterol, guaiFENesin-dextromethorphan, ondansetron **OR** ondansetron (ZOFRAN) IV, perflutren lipid microspheres (DEFINITY) IV suspension, polyvinyl alcohol Allergies  Allergen Reactions   Codeine Other (See Comments)    Altered mental status   CBC:  Component Value Date/Time   WBC 6.7 01/06/2022 0211   HGB 9.0 (L) 01/06/2022 0211   HCT 27.7 (L) 01/06/2022 0211   PLT 200 01/06/2022 0211   MCV 88.5 01/06/2022 0211   NEUTROABS 10.0 (H) 12/24/2021 0303   LYMPHSABS 1.1 12/24/2021 0303   MONOABS 0.7 12/24/2021 0303   EOSABS 0.0 12/24/2021 0303   BASOSABS 0.0 12/24/2021 0303   Comprehensive Metabolic Panel:    Component Value Date/Time   NA 131 (L) 01/06/2022 0211   K 3.6 01/06/2022 0211   CL 99 01/06/2022 0211   CO2 25 01/06/2022 0211   BUN 21 01/06/2022 0211   CREATININE 1.03 (H) 01/06/2022 0211   GLUCOSE 119 (H) 01/06/2022 0211   CALCIUM 7.8 (L) 01/06/2022 0211   AST 23 01/05/2022 1301   ALT 14 01/05/2022 1301   ALKPHOS 35 (L) 01/05/2022 1301   BILITOT 0.5 01/05/2022 1301   PROT 7.4 01/05/2022 1301   ALBUMIN 3.6 01/05/2022 1301    Physical Exam: Vital Signs: BP (!) 149/87   Pulse 69   Temp 99.4 F (37.4 C) (Oral)   Resp (!) 21   Ht 5\' 11"  (1.803 m)   Wt 60 kg   SpO2 (!) 89%   BMI 18.45 kg/m  SpO2: SpO2: (!) 89 % O2 Device: O2 Device: NRB O2 Flow Rate: O2 Flow Rate (L/min): 10 L/min Intake/output summary:  Intake/Output Summary (Last 24  hours) at 01/06/2022 1043 Last data filed at 01/05/2022 1801 Gross per 24 hour  Intake 1412.87 ml  Output --  Net 1412.87 ml   LBM:   Baseline Weight: Weight: 60 kg Most recent weight: Weight: 60 kg  General: awake, interactive, minimally able to participate in convo due to medical status, frail Eyes: no drainage noted HENT: dry mucous membranes, on non-rebreather Cardiovascular: RRR Respiratory:  increased work of breathing noted on nonrebreather and accessory muscle use Abdomen: not distended Extremities: muscle wasting present in all extremities  Skin: ecchymosis on UE Neuro: awake, interactive, unable to fully participate in conversation  Psych: anxious          Palliative Performance Scale: 30%               Additional Data Reviewed: Recent Labs    01/05/22 1301 01/06/22 0211  WBC 6.6 6.7  HGB 11.4* 9.0*  PLT 270 200  NA 135 131*  BUN 19 21  CREATININE 1.06* 1.03*    Imaging: ECHOCARDIOGRAM COMPLETE    ECHOCARDIOGRAM REPORT       Patient Name:   Filiberto PinksNANCY CLIBORNE Hand Date of Exam: 01/06/2022 Medical Rec #:  [id number]                 Height:       71.0 in Accession #:    [id number]                Weight:       132.3 lb Date of Birth:  [birth year]                 BSA:          1.769 m Patient Age:    89 years                  BP:           137/45 mmHg Patient Gender: F                         HR:  61 bpm. Exam Location:  Inpatient  Procedure: 2D Echo, Cardiac Doppler, Color Doppler and Intracardiac            Opacification Agent  Indications:    Elevated Troponin   History:        Patient has prior history of Echocardiogram examinations, most                 recent 05/29/2019. Arrythmias:Sinus bradycardia due to                 beta-blocker; Risk Factors:Dyslipidemia and Hypertension.                 Chronic anemia. Acute hypoxic respiratory failure, pneumonia.                 Chronic kidney disease.   Sonographer:    Darlina Sicilian  RDCS Referring Phys: [id number] [place]  IMPRESSIONS   1. Left ventricular ejection fraction, by estimation, is 65 to 70%. The left ventricle has normal function. The left ventricle has no regional wall motion abnormalities. There is mild left ventricular hypertrophy. Left ventricular diastolic parameters  are consistent with Grade II diastolic dysfunction (pseudonormalization). Elevated left atrial pressure.  2. Right ventricular systolic function is normal. The right ventricular size is normal. There is mildly elevated pulmonary artery systolic pressure. The estimated right ventricular systolic pressure is 67.6 mmHg.  3. Left atrial size was mildly dilated.  4. Right atrial size was mildly dilated.  5. The mitral valve is normal in structure. Mild mitral valve regurgitation.  6. Significant calcification at the non-coronary cusp. . The aortic valve was not well visualized. Aortic valve regurgitation is trivial. Aortic valve sclerosis/calcification is present, without any evidence of aortic stenosis.  FINDINGS  Left Ventricle: Left ventricular ejection fraction, by estimation, is 65 to 70%. The left ventricle has normal function. The left ventricle has no regional wall motion abnormalities. Definity contrast agent was given IV to delineate the left ventricular  endocardial borders. The left ventricular internal cavity size was normal in size. There is mild left ventricular hypertrophy. Left ventricular diastolic parameters are consistent with Grade II diastolic dysfunction (pseudonormalization). Elevated left  atrial pressure.  Right Ventricle: The right ventricular size is normal. Right vetricular wall thickness was not well visualized. Right ventricular systolic function is normal. There is mildly elevated pulmonary artery systolic pressure. The tricuspid regurgitant velocity  is 2.91 m/s, and with an assumed right atrial pressure of 3 mmHg, the estimated right ventricular systolic pressure is  19.5 mmHg.  Left Atrium: Left atrial size was mildly dilated.  Right Atrium: Right atrial size was mildly dilated.  Pericardium: Trivial pericardial effusion is present.  Mitral Valve: The mitral valve is normal in structure. Mild mitral valve regurgitation.  Tricuspid Valve: The tricuspid valve is normal in structure. Tricuspid valve regurgitation is mild . No evidence of tricuspid stenosis.  Aortic Valve: Significant calcification at the non-coronary cusp. The aortic valve was not well visualized. Aortic valve regurgitation is trivial. Aortic valve sclerosis/calcification is present, without any evidence of aortic stenosis. Aortic valve mean  gradient measures 7.0 mmHg. Aortic valve peak gradient measures 13.5 mmHg. Aortic valve area, by VTI measures 2.26 cm.  Pulmonic Valve: The pulmonic valve was grossly normal. Pulmonic valve regurgitation is not visualized. No evidence of pulmonic stenosis.  Aorta: The aortic root and ascending aorta are structurally normal, with no evidence of dilitation.  IAS/Shunts: The interatrial septum was not well visualized.    LEFT VENTRICLE  PLAX 2D LVIDd:         4.30 cm     Diastology LVIDs:         2.70 cm     LV e' medial:    7.29 cm/s LV PW:         1.00 cm     LV E/e' medial:  18.7 LV IVS:        1.20 cm     LV e' lateral:   8.49 cm/s LVOT diam:     2.10 cm     LV E/e' lateral: 16.0 LV SV:         99 LV SV Index:   56 LVOT Area:     3.46 cm   LV Volumes (MOD) LV vol d, MOD A2C: 93.6 ml LV vol d, MOD A4C: 87.1 ml LV vol s, MOD A2C: 27.9 ml LV vol s, MOD A4C: 27.4 ml LV SV MOD A2C:     65.7 ml LV SV MOD A4C:     87.1 ml LV SV MOD BP:      63.7 ml  RIGHT VENTRICLE RV S prime:     17.40 cm/s TAPSE (M-mode): 2.6 cm  LEFT ATRIUM             Index        RIGHT ATRIUM           Index LA diam:        4.00 cm 2.26 cm/m   RA Area:     21.80 cm LA Vol (A2C):   58.0 ml 32.79 ml/m  RA Volume:   62.10 ml  35.11 ml/m LA Vol (A4C):   62.3 ml  35.22 ml/m LA Biplane Vol: 62.3 ml 35.22 ml/m  AORTIC VALVE AV Area (Vmax):    2.75 cm AV Area (Vmean):   2.70 cm AV Area (VTI):     2.26 cm AV Vmax:           184.00 cm/s AV Vmean:          122.000 cm/s AV VTI:            0.438 m AV Peak Grad:      13.5 mmHg AV Mean Grad:      7.0 mmHg LVOT Vmax:         146.00 cm/s LVOT Vmean:        95.000 cm/s LVOT VTI:          0.286 m LVOT/AV VTI ratio: 0.65   AORTA Ao Root diam: 2.80 cm Ao Asc diam:  3.10 cm  MITRAL VALVE                TRICUSPID VALVE MV Area (PHT): 3.37 cm     TR Peak grad:   33.9 mmHg MV Decel Time: 225 msec     TR Vmax:        291.00 cm/s MV E velocity: 136.00 cm/s MV A velocity: 74.40 cm/s   SHUNTS MV E/A ratio:  1.83         Systemic VTI:  0.29 m                             Systemic Diam: 2.10 cm  Epifanio Lesches MD Electronically signed by Epifanio Lesches MD Signature Date/Time: 01/06/2022/10:35:43 AM      Final   DG Chest Port 1 View CLINICAL DATA:  Pneumonia.  EXAM: PORTABLE CHEST 1 VIEW  COMPARISON:  January 05, 2022.  FINDINGS: Stable cardiomegaly. Stable pleural calcifications are noted. No acute abnormality seen in the left lung. Increased right upper and lower lobe opacities are noted concerning for pneumonia. Bony thorax is unremarkable.  IMPRESSION: Increased right lung opacities are noted concerning for pneumonia.  Electronically Signed   By: Lupita RaiderJames  Green Jr M.D.   On: 01/06/2022 09:21    I personally reviewed recent imaging.   Palliative Care Assessment and Plan Summary of Established Goals of Care and Medical Treatment Preferences   Patient is a 89 year old female with a past medical history of dementia, anxiety/depression, GERD, hyperlipidemia, hypertension, anemia of chronic disease, and recent admission 12/21-23 for toxic encephalopathy due to CAP who was admitted on 01/05/2022 for management of weakness, fever, and hypoxia.  As per EMR review, patient's family  reported that patient had been too weak to get out of bed and had been exposed to RSV with family members.  Since admission, patient receiving workup for acute hypoxic respiratory failure in the setting of multifocal pneumonia healthcare associated due to recent admission and RSV positive.  Palliative medicine team consulted to assist with complex medical decision-making.  # Complex medical decision making/goals of care  -Discussion held at bedside with patient and daughter, Tracey Rosario, present as well as described above in HPI. Tracey Rosario reports she is HCPOA. Patient has 2 daughter though other daughter is bedridden due to own medical conditions.    -Continuing with appropriate medical management at this time, such as antibiotics, to allow time for patient to recover as able. If patient's respiratory status continues to worsen, discussed may need to consider transitioning to full comfort focused care. Daughter agreeing with this plan and also changing code status to DNR/DNI at this time.   -  Code Status: DNR   # Symptom management  -Pain/Dyspnea, acute in setting of PNA and RSV   No on opioids previously    -Start IV dilaudid 0.2mg  q1hr prn. Continue to adjust based on patient's symptom burden. If needing frequent dosing, may need to consider continuous infusion.     -Using dilaudid at this time based on lab review and symptom burden. Did consider morphine though do not want to cloud Aes picture with tremors already present.     -Agitation/Anxiety, acute in setting of PNA with RSV   -Start Ativan 0.5mg  SL (due to national shortage) q4hrs prn.   # Psycho-social/Spiritual Support:  - Support System: daughters; daughter Tracey Rosario is HCPOA as per report  # Discharge Planning:  TBD  Thank you for allowing the palliative care team to participate in the care Tracey Rosario.  Alvester MorinLauren Chadley Dziedzic, DO Palliative Care Provider PMT # [phone]  If patient remains symptomatic despite  maximum doses, please call PMT at [phone] between 0700 and 1900. Outside of these hours, please call attending, as PMT does not have night coverage.  Addendum: Informed by RN patient's niece at bedside and has questions regarding care.  Presented to bedside to further discuss care with her as well as Tracey PikesSusan who was still present at bedside.  Explained again continuing appropriate medical interventions at this time to see how patient does.  Again spent time explaining with regards to CODE STATUS that should patient be sick enough that her heart stops or she stops breathing, that is death and interventions such as chest compressions and intubation is not going to reverse the process and allow patient to return to a good quality of life.  Patient was  fairly functional before these bouts with medical illness.  Discussed that is why it is appropriate to continue with medical interventions at this time that we will assist patient get back to her prior quality of life and not cause detriment to it.  Also spent time reviewing use of Dilaudid and Ativan to assist with symptom management.  Discussed that patient is being started at very low doses due to to no recent exposure to opioids or benzodiazepines.  Splane to weighing risk and benefits of medications and hope is that it will allow patient to calm down enough that her respiratory rate will decrease so her lungs can focus on healing and not working so hard.  After much discussion, niece agreeing with plan.  Tracey Pikes continues to agree with this plan and notes that patient actually appears less agitated and more comfortable than she did earlier since she has now received the Ativan.  Empathized with nieces difficult situation.  Niece tearful during conversation noting that her grandmother was fine over a week ago.  Acknowledged this and that no matter your age, multiple insults to her body on top of known medical conditions can be overwhelming.  Patient's body is  leading the way at this time with all appropriate medical support ordered.  Noted palliative medicine will continue to follow for discussions.  Additional time spent on this interaction was 15 minutes. Spent time reviewing medical care plan with family and providing emotional support.

## 2022-01-06 NOTE — Progress Notes (Signed)
  Daily Progress Note   Patient Name: Tracey Rosario       Date: 01/06/2022 DOB: 06-Jul-1926  Age: 89 y.o. MRN#: [id number] Attending Physician: Antonieta Pert, MD Primary Care Physician: Simona Huh, NP Admit Date: 01/05/2022 Length of Stay: 1 day  Checked on patient multiple times during days to assist with symptom management.  Ordered as needed liquid Tylenol as well.  Patient having fever up to 104.90F.  Checked on patient later in afternoon after administration of Dilaudid.  Patient's respiratory rate improved.  Multiple family members at bedside including [name] and nieces.  Answered questions regarding medical care.  Family agreed that patient appeared more relaxed and her breathing looked easier after receiving Dilaudid.  Will continue on current doses of Ativan and Dilaudid.  Noted further adjustments can be made based on patient's symptom burden.  Family stated patient did get bed assignment and should be moving to her room soon.  All question answered at that time.  Palliative medicine team will continue to follow along during patient's medical course.   Chelsea Aus, DO Palliative Care Provider PMT # [phone]

## 2022-01-06 NOTE — Progress Notes (Signed)
  Echocardiogram 2D Echocardiogram has been performed.  Darlina Sicilian M 01/06/2022, 10:32 AM

## 2022-01-06 NOTE — Progress Notes (Signed)
  Echocardiogram 2D Echocardiogram has been performed.  Tracey Rosario M 01/06/2022, 9:46 AM

## 2022-01-07 ENCOUNTER — Inpatient Hospital Stay (HOSPITAL_COMMUNITY): Payer: Medicare Other

## 2022-01-07 DIAGNOSIS — B338 Other specified viral diseases: Secondary | ICD-10-CM

## 2022-01-07 DIAGNOSIS — R451 Restlessness and agitation: Secondary | ICD-10-CM | POA: Diagnosis not present

## 2022-01-07 DIAGNOSIS — R6520 Severe sepsis without septic shock: Secondary | ICD-10-CM | POA: Diagnosis not present

## 2022-01-07 DIAGNOSIS — J9601 Acute respiratory failure with hypoxia: Secondary | ICD-10-CM | POA: Diagnosis not present

## 2022-01-07 DIAGNOSIS — Z7189 Other specified counseling: Secondary | ICD-10-CM | POA: Diagnosis not present

## 2022-01-07 DIAGNOSIS — Z79899 Other long term (current) drug therapy: Secondary | ICD-10-CM

## 2022-01-07 DIAGNOSIS — J189 Pneumonia, unspecified organism: Secondary | ICD-10-CM

## 2022-01-07 DIAGNOSIS — Z515 Encounter for palliative care: Secondary | ICD-10-CM | POA: Diagnosis not present

## 2022-01-07 DIAGNOSIS — A419 Sepsis, unspecified organism: Secondary | ICD-10-CM | POA: Diagnosis not present

## 2022-01-07 LAB — BASIC METABOLIC PANEL
Anion gap: 10 (ref 5–15)
BUN: 19 mg/dL (ref 8–23)
CO2: 25 mmol/L (ref 22–32)
Calcium: 7.9 mg/dL — ABNORMAL LOW (ref 8.9–10.3)
Chloride: 100 mmol/L (ref 98–111)
Creatinine, Ser: 0.99 mg/dL (ref 0.44–1.00)
GFR, Estimated: 53 mL/min — ABNORMAL LOW (ref >60)
Glucose, Bld: 115 mg/dL — ABNORMAL HIGH (ref 70–99)
Potassium: 4.1 mmol/L (ref 3.5–5.1)
Sodium: 135 mmol/L (ref 135–145)

## 2022-01-07 LAB — CBC
HCT: 29.9 % — ABNORMAL LOW (ref 36.0–46.0)
Hemoglobin: 9.2 g/dL — ABNORMAL LOW (ref 12.0–15.0)
MCH: 28.6 pg (ref 26.0–34.0)
MCHC: 30.8 g/dL (ref 30.0–36.0)
MCV: 92.9 fL (ref 80.0–100.0)
Platelets: 159 10*3/uL (ref 150–400)
RBC: 3.22 MIL/uL — ABNORMAL LOW (ref 3.87–5.11)
RDW: 14.1 % (ref 11.5–15.5)
WBC: 6.4 10*3/uL (ref 4.0–10.5)
nRBC: 0 % (ref 0.0–0.2)

## 2022-01-07 MED ORDER — ACETAMINOPHEN 650 MG RE SUPP
650.0000 mg | Freq: Four times a day (QID) | RECTAL | Status: DC | PRN
  Administered 2022-01-07 – 2022-01-09 (×3): 650 mg via RECTAL
  Filled 2022-01-07 (×2): qty 1

## 2022-01-07 MED ORDER — ACETAMINOPHEN 160 MG/5ML PO SOLN: 650.0000 mg | Freq: Four times a day (QID) | ORAL | Status: DC | PRN

## 2022-01-07 MED ORDER — ACETAMINOPHEN 650 MG RE SUPP
RECTAL | Status: AC
  Filled 2022-01-07: qty 1

## 2022-01-07 MED ORDER — SODIUM CHLORIDE 0.9 % IV SOLN
INTRAVENOUS | Status: DC | PRN
  Administered 2022-01-07: 250 mL via INTRAVENOUS

## 2022-01-07 MED ORDER — LORAZEPAM 2 MG/ML IJ SOLN
1.0000 mg | INTRAMUSCULAR | Status: DC | PRN
  Administered 2022-01-07 – 2022-01-11 (×7): 1 mg via INTRAVENOUS
  Filled 2022-01-07 (×8): qty 1

## 2022-01-07 MED ORDER — HYDROMORPHONE HCL 1 MG/ML IJ SOLN
0.5000 mg | INTRAMUSCULAR | Status: DC | PRN
  Administered 2022-01-07 – 2022-01-11 (×12): 0.5 mg via INTRAVENOUS
  Filled 2022-01-07 (×12): qty 0.5

## 2022-01-07 MED ORDER — HYDROMORPHONE HCL 1 MG/ML IJ SOLN: 0.4000 mg | INTRAMUSCULAR | Status: DC | PRN

## 2022-01-07 NOTE — Progress Notes (Signed)
Patient observed having moderate to severe tremors  in the upper and lower extremities- Intermittently. On call APP is aware of the shakiness/tremors the patient is experiencing. Patient HR increases to 160s non sustain during episode of tremor/shakiness. No c/o chest pain.  No orders given.Prns medications given.

## 2022-01-07 NOTE — Progress Notes (Signed)
PROGRESS NOTE Davy Faught  DUK:[id number] DOB: 1926-05-29 DOA: 01/05/2022 PCP: Simona Huh, NP  Brief Narrative/Hospital Course: 89 year old female with history of dementia anxiety/depression, GERD, HLD, hypertension, anemia of chronic disease, sinus bradycardia due to beta-blocker recent admission 12/21 - 12/23 for toxic encephalopathy due to Community acquired pneumonia who lives alone brought to the ED with complaint of generalized weakness, fever and hypoxic 83% on EMS arrival, fever of 102.5.  Patient has been still on bed and too weak to get out of bed, has been exposed to RSV with family members. In the ED, vitals: Fever order 2.5 BP 140s to 160s, needing 5 L nasal cannula oxygen. Labs: Stable CBC, bmp/lactic acid> fairly normal.Troponin/blood culture/respiratory virus panel pending. Chest x-ray new patchy infiltrate seen in the right middle and right lower lung suggestive of multifocal pneumonia, small right pleural effusion, infiltrate in the right upper lobe has not changed significantly. Patient appeared to be confused with generalized weakness. Code sepsis was activated: Vancomycin, cefepime and fluid bolus was ordered.Admission was requested, she is on 6l HFNC on admission. Since admission tested positive for RSV and positive troponin level fluctuating mostly flat, no chest pain Patient has further worsening of her pneumonia based on chest x-ray somewhat shaky, deconditioned weak.  Palliative care was consulted.   Subjective:  Seen and examined this morning overnight she also needed nonrebreather and 15 L high flow nasal cannula, respiratory chest removed nonrebreather after nebulizer now on 15 L HFNC  At times she gets shaking per daughter at the bedside.  Tmax 102.1 earlier this morning at 4 am Labs reviewed and stable hemoglobin renal function Obtained chest x-ray showed a stable right lung opacities  Assessment and Plan: Principal Problem:   Severe sepsis  ([place]) Active Problems:   Palliative care encounter   Acute hypoxic respiratory failure (HCC)   Goals of care, counseling/discussion   DNR (do not resuscitate)   Shortness of breath   High risk medication use   Agitation   Sepsis with acute hypoxic respiratory failure without septic shock ([place])   Need for emotional support   Counseling and coordination of care   Multifocal pneumonia possible healthcare associated Acute hypoxic respiratory failure Severe sepsis POA Acute RSV infection: Chest x-ray with multifocal pneumonia RML and RLL, lactic acid blood work fairly stable.  However patient continues to be hypoxic initially needing significant amount of oxygen.  On NRB and HFNC this morning off NRB.  Palliative care following closely on Dilaudid for symptom management.  Continuing broad-spectrum antibiotics : vancomycin/cefepime, follow-up culture data urine antigen sputum culture.  Monitor chest x-ray intermittently.  Overall prognosis does not appear bright.  Monitor respiratory status continue Lasix 20 mg daily.  Continue supplemental oxygen. Recent Labs  Lab 01/05/22 1301 01/05/22 1341 01/06/22 0211 01/07/22 0458  WBC 6.6  --  6.7 6.4  LATICACIDVEN  --  1.1  --   --      Demand ischemia with elevated troponin EKG no acute ST-T wave changes.  Echocardiogram obtained showed EF 65 to 70%, G2 DD, no RWMA significant calcification at the noncoronary cusp, mitral valve normal.    Acute on chronic diastolic CHF with grade 2 DD.  BNP elevated 443.  Will continue gentle IV Lasix monitor intake output Daily weight  Mild Dementia:Continue Namenda/Aricept. Anxiety/depression: Mood stable.  Continue medication for symptom management per palliative care. GERD: Continue PPI HLD: cont statin. stable CK Hypothyroidism: cont synthyroid Hypertension: BP is stable, continue holding lisinopril/Amlodipine Anemia of chronic disease:hb stable>  drifting downward monitor Generalized weakness: PTOT   eval as able  Goals of care currently DNR: overall prognosis does not appear bright palliative care consulted and following closely. Patient with worsening pneumonia appears severely frail more hypoxic, she is at risk of decompensation, communicated with patient's daughter at the bedside > she understand that if patient continues to worsen hospice will  be recommended.  For now continue full support.    DVT prophylaxis: enoxaparin (LOVENOX) injection 40 mg Start: 01/05/22 2200 SCDs Start: 01/05/22 1622 Code Status:   Code Status: DNR Family Communication: plan of care discussed with patient/daughter at bedside. Patient status is: Inpatient because of worsening respiratory status Level of care: Progressive   Dispo: The patient is from: Home            Anticipated disposition: TBD Objective: Vitals last 24 hrs: Vitals:   01/07/22 0154 01/07/22 0358 01/07/22 0451 01/07/22 0655  BP: (!) 147/89 (!) 155/36 (!) 123/38 115/85  Pulse: 77 86 64 61  Resp: (!) 21  19 (!) 22  Temp: 98.4 F (36.9 C) (!) 102.1 F (38.9 C) 98.3 F (36.8 C) 98.4 F (36.9 C)  TempSrc: Oral Oral Axillary Axillary  SpO2: 95% (!) 86% 96% 98%  Weight:      Height:       Weight change:   Physical Examination: General exam: AA hard of hearing, ill looking, not in distress HEENT:Oral mucosa moist, Ear/Nose WNL grossly, dentition normal. Respiratory system: bilaterally diminished BS,no use of accessory muscle Cardiovascular system: S1 & S2 +, regular rate. Gastrointestinal system: Abdomen soft, NT,ND,BS+ Nervous System: Elderly and frail. Extremities: LE ankle edema neg, lower extremities warm Skin: No rashes,no icterus. MSK: small muscle bulk,tone, power  Medications reviewed:  Scheduled Meds:  aspirin EC  81 mg Oral QHS   citalopram  20 mg Oral Daily   donepezil  23 mg Oral Daily   enoxaparin (LOVENOX) injection  40 mg Subcutaneous Q24H   ferrous gluconate  324 mg Oral Daily   furosemide  20 mg Intravenous  Daily   gabapentin  300 mg Oral TID   ipratropium-albuterol  3 mL Nebulization QID   levothyroxine  75 mcg Oral Q0600   memantine  5 mg Oral QPM   oxybutynin  10 mg Oral Daily   pantoprazole  40 mg Oral Daily   Continuous Infusions:  sodium chloride 250 mL (01/07/22 0349)   ceFEPime (MAXIPIME) IV 2 g (01/07/22 0351)   vancomycin        Diet Order             DIET SOFT Room service appropriate? Yes; Fluid consistency: Thin  Diet effective now                  Intake/Output Summary (Last 24 hours) at 01/07/2022 1046 Last data filed at 01/07/2022 0700 Gross per 24 hour  Intake 300.32 ml  Output 800 ml  Net -499.68 ml    Net IO Since Admission: 913.19 mL [01/07/22 1046]  Wt Readings from Last 3 Encounters:  01/05/22 60 kg  12/23/21 60.8 kg  11/17/21 62.1 kg     Unresulted Labs (From admission, onward)     Start     Ordered   01/12/22 0500  Creatinine, serum  (enoxaparin (LOVENOX)    CrCl >/= 30 ml/min)  Weekly,   R     Comments: while on enoxaparin therapy    01/05/22 1621   01/06/22 0500  Basic metabolic panel  Daily,  R      01/05/22 1642   01/06/22 0500  CBC  Daily,   R      01/05/22 1642   01/05/22 1644  Expectorated Sputum Assessment w Gram Stain, Rflx to Resp Cult  Once,   R        01/05/22 1643          Data Reviewed: I have personally reviewed following labs and imaging studies CBC: Recent Labs  Lab 01/05/22 1301 01/06/22 0211 01/07/22 0458  WBC 6.6 6.7 6.4  HGB 11.4* 9.0* 9.2*  HCT 34.8* 27.7* 29.9*  MCV 88.8 88.5 92.9  PLT 270 200 159    Basic Metabolic Panel: Recent Labs  Lab 01/05/22 1301 01/06/22 0211 01/07/22 0458  NA 135 131* 135  K 4.2 3.6 4.1  CL 97* 99 100  CO2 27 25 25   GLUCOSE 97 119* 115*  BUN 19 21 19   CREATININE 1.06* 1.03* 0.99  CALCIUM 8.7* 7.8* 7.9*    GFR: Estimated Creatinine Clearance: 33.6 mL/min (by C-G formula based on SCr of 0.99 mg/dL). Liver Function Tests: Recent Labs  Lab 01/05/22 1301  AST 23   ALT 14  ALKPHOS 35*  BILITOT 0.5  PROT 7.4  ALBUMIN 3.6   Coagulation Profile: Recent Labs  Lab 01/05/22 1341  INR 1.1    Recent Labs  Lab 01/05/22 1447  GLUCAP 99    Recent Labs  Lab 01/05/22 1341  LATICACIDVEN 1.1    Recent Results (from the past 240 hour(s))  Resp panel by RT-PCR (RSV, Flu A&B, Covid) Anterior Nasal Swab     Status: Abnormal   Collection Time: 01/05/22  1:01 PM   Specimen: Anterior Nasal Swab  Result Value Ref Range Status   SARS Coronavirus 2 by RT PCR NEGATIVE NEGATIVE Final    Comment: (NOTE) SARS-CoV-2 target nucleic acids are NOT DETECTED.  The SARS-CoV-2 RNA is generally detectable in upper respiratory specimens during the acute phase of infection. The lowest concentration of SARS-CoV-2 viral copies this assay can detect is 138 copies/mL. A negative result does not preclude SARS-Cov-2 infection and should not be used as the sole basis for treatment or other patient management decisions. A negative result may occur with  improper specimen collection/handling, submission of specimen other than nasopharyngeal swab, presence of viral mutation(s) within the areas targeted by this assay, and inadequate number of viral copies(<138 copies/mL). A negative result must be combined with clinical observations, patient history, and epidemiological information. The expected result is Negative.  Fact Sheet for Patients:  BloggerCourse.comhttps://www.fda.gov/media/152166/download  Fact Sheet for Healthcare Providers:  SeriousBroker.ithttps://www.fda.gov/media/152162/download  This test is no t yet approved or cleared by the [place] FDA and  has been authorized for detection and/or diagnosis of SARS-CoV-2 by FDA under an Emergency Use Authorization (EUA). This EUA will remain  in effect (meaning this test can be used) for the duration of the COVID-19 declaration under Section 564(b)(1) of the Act, 21 U.S.C.section 360bbb-3(b)(1), unless the authorization is terminated  or  revoked sooner.       Influenza A by PCR NEGATIVE NEGATIVE Final   Influenza B by PCR NEGATIVE NEGATIVE Final    Comment: (NOTE) The Xpert Xpress SARS-CoV-2/FLU/RSV plus assay is intended as an aid in the diagnosis of influenza from Nasopharyngeal swab specimens and should not be used as a sole basis for treatment. Nasal washings and aspirates are unacceptable for Xpert Xpress SARS-CoV-2/FLU/RSV testing.  Fact Sheet for Patients: BloggerCourse.comhttps://www.fda.gov/media/152166/download  Fact Sheet for Healthcare Providers: SeriousBroker.ithttps://www.fda.gov/media/152162/download  This test is not yet approved or cleared by the [place] and has been authorized for detection and/or diagnosis of SARS-CoV-2 by FDA under an Emergency Use Authorization (EUA). This EUA will remain in effect (meaning this test can be used) for the duration of the COVID-19 declaration under Section 564(b)(1) of the Act, 21 U.S.C. section 360bbb-3(b)(1), unless the authorization is terminated or revoked.     Resp Syncytial Virus by PCR POSITIVE (A) NEGATIVE Final    Comment: (NOTE) Fact Sheet for Patients: BloggerCourse.com  Fact Sheet for Healthcare Providers: SeriousBroker.it  This test is not yet approved or cleared by the [place] FDA and has been authorized for detection and/or diagnosis of SARS-CoV-2 by FDA under an Emergency Use Authorization (EUA). This EUA will remain in effect (meaning this test can be used) for the duration of the COVID-19 declaration under Section 564(b)(1) of the Act, 21 U.S.C. section 360bbb-3(b)(1), unless the authorization is terminated or revoked.  Performed at [facility], [address] [place], [place] [address]   Blood Culture (routine x 2)     Status: None (Preliminary result)   Collection Time: 01/05/22  1:41 PM   Specimen: BLOOD  Result Value Ref Range Status   Specimen Description   Final    BLOOD  SITE NOT SPECIFIED Performed at [facility], [address], [place], [place] [address]    Special Requests   Final    BOTTLES DRAWN AEROBIC AND ANAEROBIC Blood Culture results may not be optimal due to an inadequate volume of blood received in culture bottles Performed at [facility], [address] [place], [place] [address]    Culture   Final    NO GROWTH < 24 HOURS Performed at [facility], [address] [place], [place] [address]    Report Status PENDING  Incomplete  Blood Culture (routine x 2)     Status: None (Preliminary result)   Collection Time: 01/05/22  3:29 PM   Specimen: BLOOD  Result Value Ref Range Status   Specimen Description   Final    BLOOD LEFT ANTECUBITAL Performed at [facility], [address], [place], [place] [address]    Special Requests   Final    BOTTLES DRAWN AEROBIC AND ANAEROBIC Blood Culture adequate volume Performed at [facility], [address] [place], [place] [address]    Culture   Final    NO GROWTH < 24 HOURS Performed at [facility], [address], [place], [place] [address]    Report Status PENDING  Incomplete  Urine Culture     Status: Abnormal   Collection Time: 01/05/22  6:27 PM   Specimen: In/Out Cath Urine  Result Value Ref Range Status   Specimen Description   Final    IN/OUT CATH URINE Performed at [facility], [address], [place], [place] [address]    Special Requests   Final    NONE Performed at [facility], [address] [place], [place] [address]    Culture MULTIPLE SPECIES PRESENT, SUGGEST RECOLLECTION (A)  Final   Report Status 01/06/2022 FINAL  Final  MRSA Next Gen by PCR, Nasal     Status: None   Collection Time: 01/05/22 11:49 PM  Result Value Ref Range Status   MRSA by PCR Next Gen NOT DETECTED NOT DETECTED Final    Comment: (NOTE) The GeneXpert MRSA Assay (FDA approved for  NASAL  specimens only), is one component of a comprehensive MRSA colonization surveillance program. It is not intended to diagnose MRSA infection nor to guide or monitor treatment for MRSA infections. Test performance is not FDA approved in patients less than 87 years old. Performed at [facility], [address] [place], [place] [address]     Antimicrobials: Anti-infectives (From admission, onward)    Start     Dose/Rate Route Frequency Ordered Stop   01/07/22 1600  vancomycin (VANCOREADY) IVPB 1250 mg/250 mL        1,250 mg 166.7 mL/hr over 90 Minutes Intravenous Every 48 hours 01/05/22 1630     01/06/22 0400  ceFEPIme (MAXIPIME) 2 g in sodium chloride 0.9 % 100 mL IVPB        2 g 200 mL/hr over 30 Minutes Intravenous Every 12 hours 01/05/22 1631     01/05/22 1400  vancomycin (VANCOREADY) IVPB 1250 mg/250 mL        1,250 mg 166.7 mL/hr over 90 Minutes Intravenous  Once 01/05/22 1351 01/05/22 1801   01/05/22 1345  vancomycin (VANCOCIN) IVPB 1000 mg/200 mL premix  Status:  Discontinued        1,000 mg 200 mL/hr over 60 Minutes Intravenous  Once 01/05/22 1341 01/05/22 1351   01/05/22 1345  ceFEPIme (MAXIPIME) 2 g in sodium chloride 0.9 % 100 mL IVPB        2 g 200 mL/hr over 30 Minutes Intravenous  Once 01/05/22 1341 01/05/22 1610      Culture/Microbiology    Component Value Date/Time   SDES  01/05/2022 1827    IN/OUT CATH URINE Performed at [facility], [address] [place], [place] [address]    SPECREQUEST  01/05/2022 1827    NONE Performed at [facility], [address] [place], [place] [address]    CULT MULTIPLE SPECIES PRESENT, SUGGEST RECOLLECTION (A) 01/05/2022 1827   REPTSTATUS 01/06/2022 FINAL 01/05/2022 1827  Radiology Studies: [facility] Chest Port 1 View  Result Date: 01/07/2022 CLINICAL DATA:  Shortness of breath. EXAM: PORTABLE CHEST 1 VIEW COMPARISON:  January 06, 2022. FINDINGS: Stable cardiomegaly. Stable  calcified bilateral pleural plaques. Stable right lung opacities concerning for pneumonia. Bony thorax is unremarkable. IMPRESSION: Stable right lung opacities as described above. Electronically Signed   By: Lupita Raider M.D.   On: 01/07/2022 09:05   ECHOCARDIOGRAM COMPLETE  Result Date: 01/06/2022    ECHOCARDIOGRAM REPORT   Patient Name:   JERRIAH INES Date of Exam: 01/06/2022 Medical Rec #:  [id number]                 Height:       71.0 in Accession #:    [id number]                Weight:       132.3 lb Date of Birth:  Jun 06, 1926                 BSA:          1.769 m Patient Age:    89 years                  BP:           137/45 mmHg Patient Gender: F                         HR:  61 bpm. Exam Location:  Inpatient Procedure: 2D Echo, Cardiac Doppler, Color Doppler and Intracardiac            Opacification Agent Indications:    Elevated Troponin  History:        Patient has prior history of Echocardiogram examinations, most                 recent 05/29/2019. Arrythmias:Sinus bradycardia due to                 beta-blocker; Risk Factors:Dyslipidemia and Hypertension.                 Chronic anemia. Acute hypoxic respiratory failure, pneumonia.                 Chronic kidney disease.  Sonographer:    Leta Jungling RDCS Referring Phys: [id number] Jiovanna Frei IMPRESSIONS  1. Left ventricular ejection fraction, by estimation, is 65 to 70%. The left ventricle has normal function. The left ventricle has no regional wall motion abnormalities. There is mild left ventricular hypertrophy. Left ventricular diastolic parameters are consistent with Grade II diastolic dysfunction (pseudonormalization). Elevated left atrial pressure.  2. Right ventricular systolic function is normal. The right ventricular size is normal. There is mildly elevated pulmonary artery systolic pressure. The estimated right ventricular systolic pressure is 36.9 mmHg.  3. Left atrial size was mildly dilated.  4. Right atrial size was  mildly dilated.  5. The mitral valve is normal in structure. Mild mitral valve regurgitation.  6. Significant calcification at the non-coronary cusp. . The aortic valve was not well visualized. Aortic valve regurgitation is trivial. Aortic valve sclerosis/calcification is present, without any evidence of aortic stenosis. FINDINGS  Left Ventricle: Left ventricular ejection fraction, by estimation, is 65 to 70%. The left ventricle has normal function. The left ventricle has no regional wall motion abnormalities. Definity contrast agent was given IV to delineate the left ventricular  endocardial borders. The left ventricular internal cavity size was normal in size. There is mild left ventricular hypertrophy. Left ventricular diastolic parameters are consistent with Grade II diastolic dysfunction (pseudonormalization). Elevated left atrial pressure. Right Ventricle: The right ventricular size is normal. Right vetricular wall thickness was not well visualized. Right ventricular systolic function is normal. There is mildly elevated pulmonary artery systolic pressure. The tricuspid regurgitant velocity  is 2.91 m/s, and with an assumed right atrial pressure of 3 mmHg, the estimated right ventricular systolic pressure is 36.9 mmHg. Left Atrium: Left atrial size was mildly dilated. Right Atrium: Right atrial size was mildly dilated. Pericardium: Trivial pericardial effusion is present. Mitral Valve: The mitral valve is normal in structure. Mild mitral valve regurgitation. Tricuspid Valve: The tricuspid valve is normal in structure. Tricuspid valve regurgitation is mild . No evidence of tricuspid stenosis. Aortic Valve: Significant calcification at the non-coronary cusp. The aortic valve was not well visualized. Aortic valve regurgitation is trivial. Aortic valve sclerosis/calcification is present, without any evidence of aortic stenosis. Aortic valve mean  gradient measures 7.0 mmHg. Aortic valve peak gradient measures 13.5  mmHg. Aortic valve area, by VTI measures 2.26 cm. Pulmonic Valve: The pulmonic valve was grossly normal. Pulmonic valve regurgitation is not visualized. No evidence of pulmonic stenosis. Aorta: The aortic root and ascending aorta are structurally normal, with no evidence of dilitation. IAS/Shunts: The interatrial septum was not well visualized.  LEFT VENTRICLE PLAX 2D LVIDd:         4.30 cm     Diastology LVIDs:  2.70 cm     LV e' medial:    7.29 cm/s LV PW:         1.00 cm     LV E/e' medial:  18.7 LV IVS:        1.20 cm     LV e' lateral:   8.49 cm/s LVOT diam:     2.10 cm     LV E/e' lateral: 16.0 LV SV:         99 LV SV Index:   56 LVOT Area:     3.46 cm  LV Volumes (MOD) LV vol d, MOD A2C: 93.6 ml LV vol d, MOD A4C: 87.1 ml LV vol s, MOD A2C: 27.9 ml LV vol s, MOD A4C: 27.4 ml LV SV MOD A2C:     65.7 ml LV SV MOD A4C:     87.1 ml LV SV MOD BP:      63.7 ml RIGHT VENTRICLE RV S prime:     17.40 cm/s TAPSE (M-mode): 2.6 cm LEFT ATRIUM             Index        RIGHT ATRIUM           Index LA diam:        4.00 cm 2.26 cm/m   RA Area:     21.80 cm LA Vol (A2C):   58.0 ml 32.79 ml/m  RA Volume:   62.10 ml  35.11 ml/m LA Vol (A4C):   62.3 ml 35.22 ml/m LA Biplane Vol: 62.3 ml 35.22 ml/m  AORTIC VALVE AV Area (Vmax):    2.75 cm AV Area (Vmean):   2.70 cm AV Area (VTI):     2.26 cm AV Vmax:           184.00 cm/s AV Vmean:          122.000 cm/s AV VTI:            0.438 m AV Peak Grad:      13.5 mmHg AV Mean Grad:      7.0 mmHg LVOT Vmax:         146.00 cm/s LVOT Vmean:        95.000 cm/s LVOT VTI:          0.286 m LVOT/AV VTI ratio: 0.65  AORTA Ao Root diam: 2.80 cm Ao Asc diam:  3.10 cm MITRAL VALVE                TRICUSPID VALVE MV Area (PHT): 3.37 cm     TR Peak grad:   33.9 mmHg MV Decel Time: 225 msec     TR Vmax:        291.00 cm/s MV E velocity: 136.00 cm/s MV A velocity: 74.40 cm/s   SHUNTS MV E/A ratio:  1.83         Systemic VTI:  0.29 m                             Systemic Diam: 2.10 cm  Epifanio Lesches MD Electronically signed by Epifanio Lesches MD Signature Date/Time: 01/06/2022/10:35:43 AM    Final    DG Chest Port 1 View  Result Date: 01/06/2022 CLINICAL DATA:  Pneumonia. EXAM: PORTABLE CHEST 1 VIEW COMPARISON:  January 05, 2022. FINDINGS: Stable cardiomegaly. Stable pleural calcifications are noted. No acute abnormality seen in the left lung. Increased right upper and lower lobe opacities are noted concerning for  pneumonia. Bony thorax is unremarkable. IMPRESSION: Increased right lung opacities are noted concerning for pneumonia. Electronically Signed   By: Marijo Conception M.D.   On: 01/06/2022 09:21   DG Chest 2 View  Result Date: 01/05/2022 CLINICAL DATA:  Shortness of breath, fever EXAM: CHEST - 2 VIEW COMPARISON:  Previous studies including the examination done on 12/24/2021 FINDINGS: Transverse diameter of heart is increased. There is interval appearance of new patchy infiltrates in right mid and right lower lung fields. There is interval appearance of small right pleural effusion. Infiltrate in right upper lung fields has not changed. Extensive pleural calcifications and pleural thickening are noted in both apices. Left lung shows no new focal infiltrates. IMPRESSION: New patchy infiltrates are seen in right mid and right lower lung fields suggesting multifocal pneumonia. Small right pleural effusion. Infiltrate in right upper lobe has not changed significantly. Electronically Signed   By: Elmer Picker M.D.   On: 01/05/2022 13:43     LOS: 2 days   Antonieta Pert, MD [facility]  01/07/2022, 10:46 AM

## 2022-01-07 NOTE — Progress Notes (Deleted)
  Daily Progress Note   Patient Name: Tracey Rosario       Date: 01/07/2022 DOB: 13-Nov-1926  Age: 89 y.o. MRN#: [id number] Attending Physician: Antonieta Pert, MD Primary Care Physician: Simona Huh, NP Admit Date: 01/05/2022 Length of Stay: 2 days   Upon EMR review, patient did not require boluses of medication after increase in basal dosing yesterday. Checked in with bedside RN today regarding patient's status this morning. Patient is unresponsive and so ordered discontinuation of Grill as symptoms being managed by medications. Informed by RN patient's TOD was 1015.    Chelsea Aus, DO Palliative Care Provider PMT # [phone]

## 2022-01-07 NOTE — Progress Notes (Signed)
   01/07/22 0358  Assess: MEWS Score  Temp (!) 102.1 F (38.9 C)  BP (!) 155/36  MAP (mmHg) (!) 58  Pulse Rate 86  Level of Consciousness Alert  SpO2 (!) 86 %  O2 Device HFNC  O2 Flow Rate (L/min) 15 L/min  Assess: MEWS Score  MEWS Temp 2  MEWS Systolic 0  MEWS Pulse 0  MEWS RR 1  MEWS LOC 0  MEWS Score 3  MEWS Score Color Yellow  Assess: if the MEWS score is Yellow or Red  Were vital signs taken at a resting state? Yes  Focused Assessment No change from prior assessment  Does the patient meet 2 or more of the SIRS criteria? Yes  Does the patient have a confirmed or suspected source of infection? Yes  Provider and Rapid Response Notified? Yes  MEWS guidelines implemented *See Row Information* Yes  Treat  MEWS Interventions Administered scheduled meds/treatments;Escalated (See documentation below);Administered prn meds/treatments  Pain Scale PAINAD  Breathing 1  Negative Vocalization 1  Facial Expression 0  Body Language 1  Consolability 0  PAINAD Score 3  Take Vital Signs  Increase Vital Sign Frequency  Yellow: Q 2hr X 2 then Q 4hr X 2, if remains yellow, continue Q 4hrs  Escalate  MEWS: Escalate Yellow: discuss with charge nurse/RN and consider discussing with provider and RRT  Notify: Charge Nurse/RN  Name of Charge Nurse/RN Notified Lauren  Date Charge Nurse/RN Notified 01/07/22  Time Charge Nurse/RN Notified 0410  Provider Notification  Provider Name/Title Zebedee Iba, Loni Muse  Date Provider Notified 01/07/22  Time Provider Notified [phone]  Method of Notification Page  Notification Reason Change in status  Provider response No new orders  Date of Provider Response 01/07/22  Time of Provider Response  (0430)  Notify: Rapid Response  Name of Rapid Response RN Notified Erin , RN  Date Rapid Response Notified 01/07/22  Time Rapid Response Notified 0455  Assess: SIRS CRITERIA  SIRS Temperature  1  SIRS Pulse 0  SIRS Respirations  1  SIRS WBC 0  SIRS Score Sum  2

## 2022-01-07 NOTE — Progress Notes (Addendum)
Daily Progress Note   Patient Name: Tracey Rosario       Date: 01/07/2022 DOB: 11/15/1926  Age: 89 y.o. MRN#: [id number] Attending Physician: Antonieta Pert, MD Primary Care Physician: Simona Huh, NP Admit Date: 01/05/2022 Length of Stay: 2 days  Reason for Consultation/Follow-up: Establishing goals of care and symptom management  Subjective:   CC: Patient much more awake this morning and feels that her breathing is doing better.  Following up regarding complex medical decision making and symptom management.  Subjective:  Tensive review of EMR prior to seeing patient.  As per EMR review, patient received IV Dilaudid 0.2 mg x 4 doses over the past 24 hours.  Patient also received IV Ativan 0.5 mg x 4 doses over the past 24 hours.  Patient did receive Tylenol again this morning for temperature of 102.5 F. Patient remains on high flow nasal cannula at 15 L/min.  Nonrebreather has been removed at this time based on patient maintaining O2 saturation.  Also reviewed patient's echo imaging which showed EF of 60-70% and changes associated with aging.  Presented to bedside to check on patient this morning.  Patient's daughter Tracey Rosario was present at bedside.  Reintroduced myself as a member of the palliative medicine team.  Patient initially sleeping comfortably though easily awakened.  Tracey Rosario noted that patient had tremors overnight again and was occasion which helped.  She is a new patient had received Tylenol as well for fever elevation this morning.  Tracey Rosario noted that patient much more interactive today though still falling asleep easily.  Tracey Rosario agreed that patient "looks better" than she did yesterday. Tracey Rosario also feels the dilaudid and ativan and helping with patient's management at their current doses.  Will agree that patient's work of breathing has greatly improved as compared to yesterday.  Discussed continuing current appropriate medical management.  Continuing as needed medications to assist  with respiratory management.  Again discussed that time will tell how patient's body is going to respond knowing she has pneumonia and RSV.  Taking things a day at a time.  Patient herself is able to state that her breathing feels better today.  Is unable to state her location and did not know she was in the hospital.  When inquiring how to make patient's day little better, she asked if she could go home.  Tracey Rosario admitted she knew her mother would say this.  And knowledge patient's wish to go home and also that she is still ill and is requiring treatment in the hospital for pneumonia.   Spent time answering questions as able.  Noted palliative medicine team will continue to follow along.  Review of Systems Feels breathing has improved. Objective:   Vital Signs:  BP 115/85 (BP Location: Left Arm)   Pulse 61   Temp 98.4 F (36.9 C) (Axillary)   Resp (!) 22   Ht 5\' 11"  (1.803 m)   Wt 60 kg   SpO2 98%   BMI 18.45 kg/m   Physical Exam: General: awake, alert, interactive, NAD eyes: no drainage noted Cardiovascular: RRR Respiratory: No increased work of breathing noted, not in respiratory distress, on HCNF Abdomen: not distended Extremities: muscle wasting present in all extremities  Skin: ecchymosis on UE Neuro: awake, interactive, pleasant, oriented to self not location Psych: calm  Imaging:  I personally reviewed recent imaging.   Assessment & Plan:   Assessment: Patient is a 89 year old female with a past medical history of dementia, anxiety/depression, GERD, hyperlipidemia, hypertension, anemia of chronic disease,  and recent admission 12/21-23 for toxic encephalopathy due to CAP who was admitted on 01/05/2022 for management of weakness, fever, and hypoxia.  As per EMR review, patient's family reported that patient had been too weak to get out of bed and had been exposed to RSV with family members.  Since admission, patient receiving workup for acute hypoxic respiratory failure in  the setting of multifocal pneumonia healthcare associated due to recent admission and RSV positive.  Palliative medicine team consulted to assist with complex medical decision-making.   Recommendations/Plan: # Complex medical decision making/goals of care:    -Discussion held with patient and daughter, Tracey Rosario, as described above in HPI. Tracey Rosario reported she is HCPOA. Patient has 2 daughter though other daughter is bedridden due to own medical conditions.  Again discussed continuing with appropriate medical management at this time, such as antibiotics, to allow time for patient to recover as able. Daughter agreeing with this plan.   -  Code Status: DNR   -Pain/Dyspnea, acute in setting of PNA and RSV                               -Continue IV dilaudid 0.2mg  q1hr prn. Continue to adjust based on patient's symptom burden.                   -Agitation/Anxiety, acute in setting of PNA with RSV                               -Continue Ativan 0.5mg  SL (due to national shortage) q4hrs prn. Continue to adjust based on patient's symptom burden.    # Psycho-social/Spiritual Support:  - Support System: daughters; daughter Tracey Rosario is HCPOA as per report  # Discharge Planning: TBD  Discussed with: patient, daughter- Tracey Rosario  Thank you for allowing the palliative care team to participate in the care [facility].  Chelsea Aus, DO Palliative Care Provider PMT # [phone]  If patient remains symptomatic despite maximum doses, please call PMT at [phone] between 0700 and 1900. Outside of these hours, please call attending, as PMT does not have night coverage.  UPDATE 1400: Informed by RN patient's symptoms no longer responding to doses of Ativan and dilaudid. Patient has increased work of breathing and tremors. Will increase IV Ativan and IV dilaudid to see if can achieve better symptom response. Will continue to follow and adjust accordingly.

## 2022-01-08 DIAGNOSIS — J9601 Acute respiratory failure with hypoxia: Secondary | ICD-10-CM | POA: Diagnosis not present

## 2022-01-08 DIAGNOSIS — Z515 Encounter for palliative care: Secondary | ICD-10-CM | POA: Diagnosis not present

## 2022-01-08 DIAGNOSIS — R4589 Other symptoms and signs involving emotional state: Secondary | ICD-10-CM

## 2022-01-08 DIAGNOSIS — Z7189 Other specified counseling: Secondary | ICD-10-CM | POA: Diagnosis not present

## 2022-01-08 DIAGNOSIS — R6520 Severe sepsis without septic shock: Secondary | ICD-10-CM | POA: Diagnosis not present

## 2022-01-08 DIAGNOSIS — A419 Sepsis, unspecified organism: Secondary | ICD-10-CM | POA: Diagnosis not present

## 2022-01-08 DIAGNOSIS — R451 Restlessness and agitation: Secondary | ICD-10-CM | POA: Diagnosis not present

## 2022-01-08 LAB — CBC
HCT: 30.7 % — ABNORMAL LOW (ref 36.0–46.0)
Hemoglobin: 9.6 g/dL — ABNORMAL LOW (ref 12.0–15.0)
MCH: 28.6 pg (ref 26.0–34.0)
MCHC: 31.3 g/dL (ref 30.0–36.0)
MCV: 91.4 fL (ref 80.0–100.0)
Platelets: 254 K/uL (ref 150–400)
RBC: 3.36 MIL/uL — ABNORMAL LOW (ref 3.87–5.11)
RDW: 14.2 % (ref 11.5–15.5)
WBC: 5.7 K/uL (ref 4.0–10.5)
nRBC: 0 % (ref 0.0–0.2)

## 2022-01-08 LAB — BASIC METABOLIC PANEL WITH GFR
Anion gap: 10 (ref 5–15)
BUN: 28 mg/dL — ABNORMAL HIGH (ref 8–23)
CO2: 29 mmol/L (ref 22–32)
Calcium: 8.7 mg/dL — ABNORMAL LOW (ref 8.9–10.3)
Chloride: 100 mmol/L (ref 98–111)
Creatinine, Ser: 0.97 mg/dL (ref 0.44–1.00)
GFR, Estimated: 54 mL/min — ABNORMAL LOW
Glucose, Bld: 112 mg/dL — ABNORMAL HIGH (ref 70–99)
Potassium: 3.9 mmol/L (ref 3.5–5.1)
Sodium: 139 mmol/L (ref 135–145)

## 2022-01-08 NOTE — Progress Notes (Signed)
PROGRESS NOTE    Tracey Rosario  ZRA:[id number] DOB: 1926/04/14 DOA: 01/05/2022 PCP: Simona Huh, NP   Brief Narrative:  89 year old female with history of dementia anxiety/depression, GERD, HLD, hypertension, anemia of chronic disease, sinus bradycardia due to beta-blocker recent admission 12/21 - 12/23 for toxic encephalopathy due to Community acquired pneumonia who lives alone brought to the ED with complaint of generalized weakness, fever and hypoxic 83% on EMS arrival, fever of 102.5.  Patient has been still on bed and too weak to get out of bed, has been exposed to RSV with family members. In the ED, vitals: Fever order 2.5 BP 140s to 160s, needing 5 L nasal cannula oxygen. Labs: Stable CBC, bmp/lactic acid> fairly normal. Chest x-ray new patchy infiltrate seen in the right middle and right lower lung suggestive of multifocal pneumonia, small right pleural effusion, infiltrate in the right upper lobe has not changed significantly. Patient appeared to be confused with generalized weakness. Code sepsis was activated: Vancomycin, cefepime and fluid bolus was ordered.Admission was requested, she is on 6l HFNC on admission. Since admission tested positive for RSV and positive troponin level fluctuating mostly flat, no chest pain Patient has further worsening of her pneumonia based on chest x-ray somewhat shaky, deconditioned weak.  Palliative care was consulted.    Assessment & Plan:   Acute hypoxemic respiratory failure and severe sepsis in the setting of multifocal pneumonia and RSV infection: -Chest x-ray with multifocal pneumonia RML and RLL, lactic acid blood work fairly stable.   - Continuing broad-spectrum antibiotics : vancomycin/cefepime -Blood culture negative till date, Legionella and strep pneumo antigen both negative.  Monitor chest x-ray intermittently.  - Palliative care following closely on Dilaudid for symptom management.  Overall prognosis  - Monitor respiratory  status continue Lasix 20 mg daily.  Continue supplemental oxygen.  Demand ischemia  -with elevated troponin EKG no acute ST-T wave changes.  Echocardiogram obtained showed EF 65 to 70%, G2 DD, no RWMA significant calcification at the noncoronary cusp, mitral valve normal.   -Patient denies chest pain   Acute on chronic diastolic CHF with grade 2 DD.   -BNP elevated 443.  Will continue gentle IV Lasix monitor intake output Daily weight   Mild Dementia:Continue Namenda/Aricept.  Anxiety/depression: Mood stable.  Continue medication for symptom management per palliative care.  GERD: Continue PPI  HLD: cont statin. stable CK  Hypothyroidism: cont synthyroid  Hypertension: BP is stable, continue holding lisinopril/Amlodipine  Anemia of chronic disease:hb stable> drifting downward monitor  Generalized weakness: PTOT  eval as able  CKD stage IIIb: At baseline.  Continue to monitor   Goals of care currently DNR: overall prognosis. palliative care consulted and following closely. Patient with worsening pneumonia appears severely frail more hypoxic, she is at risk of decompensation, communicated with patient's daughter at the bedside, she understand that if patient continues to worsen hospice will  be recommended.    DVT prophylaxis: Lovenox Code Status: DNR Family Communication: Patient's daughter present at bedside.  Plan of care discussed with patient in length and she verbalized understanding and agreed with it. Disposition Plan: To be determined  Consultants:  None  Procedures:  None  Antimicrobials:  Cefepime Vancomycin  Status is: Inpatient   Subjective: Patient seen and examined.  Daughter at the bedside.  Continues to require high flow nasal cannula.  Patient continues to get shaking episode.  Tmax 103.  No acute events overnight.  Daughter is aware of patient's overall poor prognosis.  Objective: Vitals:  01/08/22 0223 01/08/22 0427 01/08/22 0734 01/08/22 0909   BP:  (!) 125/49  (!) 153/66  Pulse:  (!) 58  79  Resp:  20 20 19   Temp: 98 F (36.7 C) 98 F (36.7 C)  (!) 97.3 F (36.3 C)  TempSrc: Axillary Rectal  Oral  SpO2:  96% 96% 93%  Weight:      Height:        Intake/Output Summary (Last 24 hours) at 01/08/2022 1059 Last data filed at 01/08/2022 1030 Gross per 24 hour  Intake 550.2 ml  Output 1100 ml  Net -549.8 ml   Filed Weights   01/05/22 1305  Weight: 60 kg    Examination:  General exam: Elderly female lying comfortably on the bed, appears weak, sick, hard of hearing.  On high flow nasal cannula Respiratory system: Clear to auscultation. Respiratory effort normal.  No wheezing, rhonchi or crackles. Cardiovascular system: S1 & S2 heard, RRR. No JVD, murmurs, rubs, gallops or clicks. No pedal edema. Gastrointestinal system: Abdomen is nondistended, soft and nontender. No organomegaly or masses felt. Normal bowel sounds heard. Central nervous system: Sleepy but arousable, able to recognize her daughter at bedside    Data Reviewed: I have personally reviewed following labs and imaging studies  CBC: Recent Labs  Lab 01/05/22 1301 01/06/22 0211 01/07/22 0458 01/08/22 0547  WBC 6.6 6.7 6.4 5.7  HGB 11.4* 9.0* 9.2* 9.6*  HCT 34.8* 27.7* 29.9* 30.7*  MCV 88.8 88.5 92.9 91.4  PLT 270 200 159 254   Basic Metabolic Panel: Recent Labs  Lab 01/05/22 1301 01/06/22 0211 01/07/22 0458 01/08/22 0547  NA 135 131* 135 139  K 4.2 3.6 4.1 3.9  CL 97* 99 100 100  CO2 27 25 25 29   GLUCOSE 97 119* 115* 112*  BUN 19 21 19  28*  CREATININE 1.06* 1.03* 0.99 0.97  CALCIUM 8.7* 7.8* 7.9* 8.7*   GFR: Estimated Creatinine Clearance: 34.3 mL/min (by C-G formula based on SCr of 0.97 mg/dL). Liver Function Tests: Recent Labs  Lab 01/05/22 1301  AST 23  ALT 14  ALKPHOS 35*  BILITOT 0.5  PROT 7.4  ALBUMIN 3.6   No results for input(s): "LIPASE", "AMYLASE" in the last 168 hours. No results for input(s): "AMMONIA" in the last 168  hours. Coagulation Profile: Recent Labs  Lab 01/05/22 1341  INR 1.1   Cardiac Enzymes: Recent Labs  Lab 01/05/22 1644  CKTOTAL 89   BNP (last 3 results) No results for input(s): "PROBNP" in the last 8760 hours. HbA1C: No results for input(s): "HGBA1C" in the last 72 hours. CBG: Recent Labs  Lab 01/05/22 1447  GLUCAP 99   Lipid Profile: No results for input(s): "CHOL", "HDL", "LDLCALC", "TRIG", "CHOLHDL", "LDLDIRECT" in the last 72 hours. Thyroid Function Tests: No results for input(s): "TSH", "T4TOTAL", "FREET4", "T3FREE", "THYROIDAB" in the last 72 hours. Anemia Panel: No results for input(s): "VITAMINB12", "FOLATE", "FERRITIN", "TIBC", "IRON", "RETICCTPCT" in the last 72 hours. Sepsis Labs: Recent Labs  Lab 01/05/22 1341  LATICACIDVEN 1.1    Recent Results (from the past 240 hour(s))  Resp panel by RT-PCR (RSV, Flu A&B, Covid) Anterior Nasal Swab     Status: Abnormal   Collection Time: 01/05/22  1:01 PM   Specimen: Anterior Nasal Swab  Result Value Ref Range Status   SARS Coronavirus 2 by RT PCR NEGATIVE NEGATIVE Final    Comment: (NOTE) SARS-CoV-2 target nucleic acids are NOT DETECTED.  The SARS-CoV-2 RNA is generally detectable in upper respiratory specimens  during the acute phase of infection. The lowest concentration of SARS-CoV-2 viral copies this assay can detect is 138 copies/mL. A negative result does not preclude SARS-Cov-2 infection and should not be used as the sole basis for treatment or other patient management decisions. A negative result may occur with  improper specimen collection/handling, submission of specimen other than nasopharyngeal swab, presence of viral mutation(s) within the areas targeted by this assay, and inadequate number of viral copies(<138 copies/mL). A negative result must be combined with clinical observations, patient history, and epidemiological information. The expected result is Negative.  Fact Sheet for Patients:   BloggerCourse.com  Fact Sheet for Healthcare Providers:  SeriousBroker.it  This test is no t yet approved or cleared by the [place] FDA and  has been authorized for detection and/or diagnosis of SARS-CoV-2 by FDA under an Emergency Use Authorization (EUA). This EUA will remain  in effect (meaning this test can be used) for the duration of the COVID-19 declaration under Section 564(b)(1) of the Act, 21 U.S.C.section 360bbb-3(b)(1), unless the authorization is terminated  or revoked sooner.       Influenza A by PCR NEGATIVE NEGATIVE Final   Influenza B by PCR NEGATIVE NEGATIVE Final    Comment: (NOTE) The Xpert Xpress SARS-CoV-2/FLU/RSV plus assay is intended as an aid in the diagnosis of influenza from Nasopharyngeal swab specimens and should not be used as a sole basis for treatment. Nasal washings and aspirates are unacceptable for Xpert Xpress SARS-CoV-2/FLU/RSV testing.  Fact Sheet for Patients: BloggerCourse.com  Fact Sheet for Healthcare Providers: SeriousBroker.it  This test is not yet approved or cleared by the [place] FDA and has been authorized for detection and/or diagnosis of SARS-CoV-2 by FDA under an Emergency Use Authorization (EUA). This EUA will remain in effect (meaning this test can be used) for the duration of the COVID-19 declaration under Section 564(b)(1) of the Act, 21 U.S.C. section 360bbb-3(b)(1), unless the authorization is terminated or revoked.     Resp Syncytial Virus by PCR POSITIVE (A) NEGATIVE Final    Comment: (NOTE) Fact Sheet for Patients: BloggerCourse.com  Fact Sheet for Healthcare Providers: SeriousBroker.it  This test is not yet approved or cleared by the [place] FDA and has been authorized for detection and/or diagnosis of SARS-CoV-2 by FDA under an Emergency Use  Authorization (EUA). This EUA will remain in effect (meaning this test can be used) for the duration of the COVID-19 declaration under Section 564(b)(1) of the Act, 21 U.S.C. section 360bbb-3(b)(1), unless the authorization is terminated or revoked.  Performed at [facility], [address], [place], [place] [address]   Blood Culture (routine x 2)     Status: None (Preliminary result)   Collection Time: 01/05/22  1:41 PM   Specimen: BLOOD  Result Value Ref Range Status   Specimen Description   Final    BLOOD SITE NOT SPECIFIED Performed at [facility], [address], [place], [place] [address]    Special Requests   Final    BOTTLES DRAWN AEROBIC AND ANAEROBIC Blood Culture results may not be optimal due to an inadequate volume of blood received in culture bottles Performed at [facility], [address], [place], [place] [address]    Culture   Final    NO GROWTH 2 DAYS Performed at [facility], [address], [place], [place] [address]    Report Status PENDING  Incomplete  Blood Culture (routine x 2)  Status: None (Preliminary result)   Collection Time: 01/05/22  3:29 PM   Specimen: BLOOD  Result Value Ref Range Status   Specimen Description   Final    BLOOD LEFT ANTECUBITAL Performed at [facility], [address] [place] [address] [place], [place] [address]    Special Requests   Final    BOTTLES DRAWN AEROBIC AND ANAEROBIC Blood Culture adequate volume Performed at [facility], [address] [place], [place] [address]    Culture   Final    NO GROWTH 2 DAYS Performed at [facility], [address], [place], [place] [address]    Report Status PENDING  Incomplete  Urine Culture     Status: Abnormal   Collection Time: 01/05/22  6:27 PM   Specimen: In/Out Cath Urine  Result Value Ref Range Status   Specimen Description   Final    IN/OUT CATH URINE Performed at [facility], [address], [place], [place] [address]    Special Requests   Final    NONE Performed at [facility], [address] [place], [place] [address]    Culture MULTIPLE SPECIES PRESENT, SUGGEST RECOLLECTION (A)  Final   Report Status 01/06/2022 FINAL  Final  MRSA Next Gen by PCR, Nasal     Status: None   Collection Time: 01/05/22 11:49 PM  Result Value Ref Range Status   MRSA by PCR Next Gen NOT DETECTED NOT DETECTED Final    Comment: (NOTE) The GeneXpert MRSA Assay (FDA approved for NASAL specimens only), is one component of a comprehensive MRSA colonization surveillance program. It is not intended to diagnose MRSA infection nor to guide or monitor treatment for MRSA infections. Test performance is not FDA approved in patients less than 72 years old. Performed at [facility], [address] [place], [place] [address]       Radiology Studies: [facility] Chest Port 1 View  Result Date: 01/07/2022 CLINICAL DATA:  Shortness of breath. EXAM: PORTABLE CHEST 1 VIEW COMPARISON:  January 06, 2022. FINDINGS: Stable cardiomegaly. Stable calcified bilateral pleural plaques. Stable right lung opacities concerning for pneumonia. Bony thorax is unremarkable. IMPRESSION: Stable right lung opacities as described above. Electronically Signed   By: Lupita Raider M.D.   On: 01/07/2022 09:05    Scheduled Meds:  aspirin EC  81 mg Oral QHS   citalopram  20 mg Oral Daily   donepezil  23 mg Oral Daily   enoxaparin (LOVENOX) injection  40 mg Subcutaneous Q24H   ferrous gluconate  324 mg Oral Daily   furosemide  20 mg Intravenous Daily   gabapentin  300 mg Oral TID   ipratropium-albuterol  3 mL Nebulization QID   levothyroxine  75 mcg Oral Q0600   memantine  5 mg Oral QPM   oxybutynin  10 mg Oral Daily   pantoprazole  40 mg Oral Daily   Continuous Infusions:  sodium chloride 250 mL (01/07/22 0349)   ceFEPime (MAXIPIME) IV 2 g (01/08/22 0427)   vancomycin  Stopped (01/07/22 1904)     LOS: 3 days   Time spent: 35 minutes   Tracey Lievanos Estill Cotta, MD [facility]  If 7PM-7AM, please contact night-coverage [email or web address] 01/08/2022, 10:59 AM

## 2022-01-08 NOTE — Plan of Care (Signed)
  Problem: Clinical Measurements: Goal: Ability to maintain a body temperature in the normal range will improve Outcome: Progressing   

## 2022-01-08 NOTE — Progress Notes (Signed)
Pharmacy Antibiotic Note  Tracey Rosario is a 89 y.o. female admitted on 01/05/2022 with pneumonia, code sepsis.  Pharmacy has been consulted for cefepime dosing.  De-escalated off Vancomycin with negative MRSA PCR result.    Plan: Continue Cefepime 2g IV q12h Follow up renal function & cultures  Height: 5\' 11"  (180.3 cm) Weight: 60 kg (132 lb 4.4 oz) IBW/kg (Calculated) : 70.8  Temp (24hrs), Avg:99.4 F (37.4 C), Min:97.3 F (36.3 C), Max:103 F (39.4 C)  Recent Labs  Lab 01/05/22 1301 01/05/22 1341 01/06/22 0211 01/07/22 0458 01/08/22 0547  WBC 6.6  --  6.7 6.4 5.7  CREATININE 1.06*  --  1.03* 0.99 0.97  LATICACIDVEN  --  1.1  --   --   --      Estimated Creatinine Clearance: 34.3 mL/min (by C-G formula based on SCr of 0.97 mg/dL).    Allergies  Allergen Reactions   Codeine Other (See Comments)    Altered mental status    Antimicrobials this admission: 1/3 Vanc >> 1/6 1/3 Cefepime >>  Dose adjustments this admission:  Microbiology results: 1/3 BCx: ngtd 1/3 UCx: mult species 1/3 Resp panel:  +RSV 1/3 MRSA PCR:  not detected  Thank you for allowing pharmacy to be a part of this patient's care.  Gretta Arab PharmD, BCPS [facility] main pharmacy [phone] 01/08/2022 1:08 PM

## 2022-01-08 NOTE — Progress Notes (Signed)
Daily Progress Note   Patient Name: Tracey Rosario       Date: 01/08/2022 DOB: Mar 07, 1926  Age: 89 y.o. MRN#: [id number] Attending Physician: Ollen Bowl, MD Primary Care Physician: Courtney Paris, NP Admit Date: 01/05/2022 Length of Stay: 3 days  Reason for Consultation/Follow-up: Establishing goals of care and symptom management  Subjective:   CC: Patient much more awake this morning and feels that her breathing is doing better.  Following up regarding complex medical decision making and symptom management.  Subjective:  Extensive review of EMR prior to seeing patient.  As per EMR review, patient received IV Dilaudid 0.2mg  x 1 dose and 0.5 mg x 3 doses over the past 24 hours.  Patient also received IV Ativan 0.5mg  x1 and 1 mg x 3 doses over the past 24 hours.  Patient received Tylenol suppository yesterday evening. Patient remains on high flow nasal cannula at 15 L/min with nonrebreather.    Discussed care with bedside RN for updates prior to seeing patient.  Presented to bedside to check on patient this morning.  Patient sleeping comfortably after receiving medications for symptom management.  Patient's daughter, Tracey Rosario, and 2 nieces at bedside.  We discussed medical updates for today.  Family able to describe that overnight patient actually awakened and was very interactive and wanting to drink Ensure and eat.  Today again though patient having tremors and increased work of breathing still requiring Ativan and Dilaudid for symptom management which makes her sleepy.  Spent extensive time discussing how hospitalizations can affect geriatric patients.  Also discussed the idea of delirium and delirium precautions that the family could help support to help keep patient's day/night cycle as normal as possible.  Family inquired about scheduling Ativan and Dilaudid and we again discussed weighing risk and benefits of medications and that those medications can make the patient sleepy and  less interactive.  At this time we will continue as needed dosing to hopefully allow for periods of wakefulness where patient can interact and eat.  Family also expressed concerns about patient's oral intake which I agreed with.  Also took time to explain that when someone is acutely ill, or our bodies do not have the drive to eat and drink like they normally do.  Also expressed that interventions like feeding tubes can make a person, especially a geriatric person, more confused and cause more pain and risk of infection rather than benefit.  Noted encouraging oral intake with family is best.  Discussed patient can sip on ensures when able as those are good sources of nutrition in patient's case.  Family acknowledged this.  Also discussed how when someone is sick in the bed for even a day he can take 3 days or more to recover physically.  Expressed patient has been seriously ill and again while we hope for recovery, she may not recover back to who she was before all this.  Noted the importance of continuing discussions and if patient does not continue to make small steps and improvement, will need to address how to make patient most comfortable moving forward.  Family remains hopeful for patient's improvement.  All questions answered as able.  Informed them that this medical provider would not be present tomorrow though my colleague would be present to continue palliative medicine team support.  Thanked family for allowing me to visit with them today.  Review of Systems Feels breathing has improved. Objective:   Vital Signs:  BP (!) 153/66 (BP Location: Right Arm)  Pulse 79   Temp (!) 97.3 F (36.3 C) (Oral)   Resp 19   Ht 5\' 11"  (1.803 m)   Wt 60 kg   SpO2 93%   BMI 18.45 kg/m   Physical Exam: General: sleeping, frail, on HFNC and nonrebreather Cardiovascular: RRR Respiratory: No increased work of breathing noted, on HCNF and nonrebreather Abdomen: not distended Extremities: muscle wasting  present in all extremities  Skin: ecchymosis on UE Neuro: sleeping  Imaging:  I personally reviewed recent imaging.   Assessment & Plan:   Assessment: Patient is a 89 year old female with a past medical history of dementia, anxiety/depression, GERD, hyperlipidemia, hypertension, anemia of chronic disease, and recent admission 12/21-23 for toxic encephalopathy due to CAP who was admitted on 01/05/2022 for management of weakness, fever, and hypoxia.  As per EMR review, patient's family reported that patient had been too weak to get out of bed and had been exposed to RSV with family members.  Since admission, patient receiving workup for acute hypoxic respiratory failure in the setting of multifocal pneumonia healthcare associated due to recent admission and RSV positive.  Palliative medicine team consulted to assist with complex medical decision-making.   Recommendations/Plan: # Complex medical decision making/goals of care:    -Again had discussion held with patient and daughter, Tracey Rosario, as described above in HPI. Tracey Rosario reported she is HCPOA. Patient has 2 daughter though other daughter is bedridden due to own medical conditions.  Essentially patient under time limited trial to determine if her body will be able to overcome this acute medical illness. Will continue with appropriate medical management at this time, such as antibiotics and O2 support, to allow time for patient to recover as able. Did discuss today that a feeding tube would only cause patient discomfort, increase risk of infection/delirium, and decrease quality of life by removing joy of food; do not recommended. Should patient deteriorate medically, will need to discuss possible transition to comfort focused care. Have expressed this multiple times to daughter and nieces. They remain hopeful for improvement.   -  Code Status: DNR  #Symptom Management   Have continued to discuss risks/benefits of these medications listed and  why continuing with prn dosing instead of continuous dosing.    -Pain/Dyspnea, acute in setting of PNA and RSV                               -Continue IV dilaudid 0.5mg  q1hr prn. Continue to adjust based on patient's symptom burden.                   -Agitation/Anxiety, acute in setting of PNA with RSV                               -Continue Ativan 1mg  IV q4hrs prn. Continue to adjust based on patient's symptom burden.    # Psycho-social/Spiritual Support:  - Support System: daughters; daughter Tracey Rosario is HCPOA as per report  # Discharge Planning: TBD  Discussed with: bedside RN, daughter- Tracey Rosario, nieces  Thank you for allowing the palliative care team to participate in the care [employer].  Chelsea Aus, DO Palliative Care Provider PMT # [phone]  If patient remains symptomatic despite maximum doses, please call PMT at [phone] between 0700 and 1900. Outside of these hours, please call attending, as PMT does not have night coverage.  This  provider spent a total of 51 minutes providing patient's care.  Includes review of EMR, discussing care with other staff members involved in patient's medical care, obtaining relevant history and information from patient and/or patient's family, and personal review of imaging and lab work. Greater than 50% of the time was spent counseling and coordinating care related to the above assessment and plan.

## 2022-01-09 DIAGNOSIS — A419 Sepsis, unspecified organism: Secondary | ICD-10-CM | POA: Diagnosis not present

## 2022-01-09 DIAGNOSIS — R6520 Severe sepsis without septic shock: Secondary | ICD-10-CM | POA: Diagnosis not present

## 2022-01-09 LAB — CBC
HCT: 28.5 % — ABNORMAL LOW (ref 36.0–46.0)
Hemoglobin: 9.2 g/dL — ABNORMAL LOW (ref 12.0–15.0)
MCH: 28.8 pg (ref 26.0–34.0)
MCHC: 32.3 g/dL (ref 30.0–36.0)
MCV: 89.3 fL (ref 80.0–100.0)
Platelets: 295 10*3/uL (ref 150–400)
RBC: 3.19 MIL/uL — ABNORMAL LOW (ref 3.87–5.11)
RDW: 14.2 % (ref 11.5–15.5)
WBC: 7.3 10*3/uL (ref 4.0–10.5)
nRBC: 0 % (ref 0.0–0.2)

## 2022-01-09 LAB — BASIC METABOLIC PANEL
Anion gap: 7 (ref 5–15)
BUN: 30 mg/dL — ABNORMAL HIGH (ref 8–23)
CO2: 28 mmol/L (ref 22–32)
Calcium: 8.5 mg/dL — ABNORMAL LOW (ref 8.9–10.3)
Chloride: 102 mmol/L (ref 98–111)
Creatinine, Ser: 0.81 mg/dL (ref 0.44–1.00)
GFR, Estimated: >60 mL/min (ref >60)
Glucose, Bld: 118 mg/dL — ABNORMAL HIGH (ref 70–99)
Potassium: 3.5 mmol/L (ref 3.5–5.1)
Sodium: 137 mmol/L (ref 135–145)

## 2022-01-09 LAB — MAGNESIUM: Magnesium: 2 mg/dL (ref 1.7–2.4)

## 2022-01-09 MED ORDER — IPRATROPIUM-ALBUTEROL 0.5-2.5 (3) MG/3ML IN SOLN
3.0000 mL | Freq: Three times a day (TID) | RESPIRATORY_TRACT | Status: DC
  Administered 2022-01-09 – 2022-01-10 (×2): 3 mL via RESPIRATORY_TRACT
  Filled 2022-01-09 (×2): qty 3

## 2022-01-09 NOTE — Progress Notes (Signed)
   01/09/22 2019  Vitals  ECG Heart Rate 89  Resp (!) 22  Level of Consciousness  Level of Consciousness Alert  PAINAD (Pain Assessment in Advanced Dementia)  Breathing 1  Negative Vocalization 1  Facial Expression 2  Body Language 2  Consolability 2  PAINAD Score 8  MEWS Score  MEWS Temp 0  MEWS Systolic 0  MEWS Pulse 0  MEWS RR 1  MEWS LOC 0  MEWS Score 1  MEWS Score Color Green     With family at bedside, patient is now scoring PAINAD of 8, visibly upset. Will administer PRN dose of medications.

## 2022-01-09 NOTE — Plan of Care (Signed)
  Problem: Clinical Measurements: Goal: Respiratory complications will improve Outcome: Progressing   

## 2022-01-09 NOTE — Progress Notes (Signed)
PROGRESS NOTE    Tracey Rosario  [id number] DOB: [birth year] DOA: 01/05/2022 PCP: Simona Huh, NP   Brief Narrative:  89 year old female with history of dementia anxiety/depression, GERD, HLD, hypertension, anemia of chronic disease, sinus bradycardia due to beta-blocker recent admission 12/21 - 12/23 for toxic encephalopathy due to Community acquired pneumonia who lives alone brought to the ED with complaint of generalized weakness, fever and hypoxic 83% on EMS arrival, fever of 102.5.  Patient has been still on bed and too weak to get out of bed, has been exposed to RSV with family members. In the ED, vitals: Fever order 2.5 BP 140s to 160s, needing 5 L nasal cannula oxygen. Labs: Stable CBC, bmp/lactic acid> fairly normal. Chest x-ray new patchy infiltrate seen in the right middle and right lower lung suggestive of multifocal pneumonia, small right pleural effusion, infiltrate in the right upper lobe has not changed significantly. Patient appeared to be confused with generalized weakness. Code sepsis was activated: Vancomycin, cefepime and fluid bolus was ordered.Admission was requested, she is on 6l HFNC on admission. Since admission tested positive for RSV and positive troponin level fluctuating mostly flat, no chest pain Patient has further worsening of her pneumonia based on chest x-ray somewhat shaky, deconditioned weak.  Palliative care was consulted.    Assessment & Plan:   Acute hypoxemic respiratory failure and severe sepsis in the setting of multifocal pneumonia and RSV infection: -Chest x-ray with multifocal pneumonia RML and RLL, lactic acid blood work fairly stable.   -On admission: Started on cefepime and vancomycin.  De-escalate off vancomycin with negative MRSA PCR on 01/08/2022 -Blood culture negative till date, Legionella and strep pneumo antigen both negative.  Monitor chest x-ray intermittently.  - Palliative care following closely on Dilaudid and Ativan for  symptom management.  Overall prognosis  - Monitor respiratory status continue Lasix 20 mg daily.  Continue supplemental oxygen.  Demand ischemia  -with elevated troponin EKG no acute ST-T wave changes.  Echocardiogram obtained showed EF 65 to 70%, G2 DD, no RWMA significant calcification at the noncoronary cusp, mitral valve normal.   -Patient denies chest pain   Acute on chronic diastolic CHF with grade 2 DD.   -BNP elevated 443.  Will continue gentle IV Lasix monitor intake output Daily weight   Mild Dementia:Continue Namenda/Aricept.  Anxiety/depression: Mood stable.  Continue medication for symptom management per palliative care.  GERD: Continue PPI  HLD: cont statin. stable CK  Hypothyroidism: cont synthyroid  Hypertension: BP is stable, continue holding lisinopril/Amlodipine  Anemia of chronic disease: Hemoglobin remained stable  Generalized weakness: PTOT  eval as able  CKD stage IIIb: At baseline.  Continue to monitor   Goals of care currently DNR: overall prognosis. palliative care consulted and following closely. Patient with worsening pneumonia appears severely frail more hypoxic, she is at risk of decompensation, communicated with patient's daughter at the bedside on 01/08/2022, she understand that if patient continues to worsen hospice will  be recommended.    DVT prophylaxis: Lovenox Code Status: DNR Family Communication: None present at bedside.  Plan of care discussed with patient in length and she verbalized understanding and agreed with it. Disposition Plan: To be determined  Consultants:  None  Procedures:  None  Antimicrobials:  Cefepime Vancomycin discontinued on 1/6  Status is: Inpatient   Subjective: Patient seen and examined.  Getting nebs treatment.  Hard of hearing.  Denies any complaint.  Tmax: 101.  No acute events overnight.  No family present at bedside  Objective: Vitals:   01/08/22 2144 01/09/22 0605 01/09/22 0818 01/09/22 1013  BP:  (!) 130/48 (!) 159/54  (!) 142/51  Pulse: 66 79  66  Resp: 18 20  20   Temp: 98.1 F (36.7 C) (!) 101 F (38.3 C)  98.5 F (36.9 C)  TempSrc: Oral Oral  Oral  SpO2: 95% 95% 97% 98%  Weight:      Height:        Intake/Output Summary (Last 24 hours) at 01/09/2022 1059 Last data filed at 01/09/2022 0600 Gross per 24 hour  Intake 200 ml  Output --  Net 200 ml    Filed Weights   01/05/22 1305  Weight: 60 kg    Examination:  General exam: Elderly female lying comfortably on the bed, appears weak, sick, hard of hearing.  On high flow nasal cannula.  Getting nebulizer treatment Respiratory system: Diminished breath sound noted.  No wheezing, rhonchi or crackles Cardiovascular system: S1 & S2 heard, RRR. No JVD, murmurs, rubs, gallops or clicks. No pedal edema. Gastrointestinal system: Abdomen is nondistended, soft and nontender. No organomegaly or masses felt. Normal bowel sounds heard. Central nervous system: Alert, following commands  Data Reviewed: I have personally reviewed following labs and imaging studies  CBC: Recent Labs  Lab 01/05/22 1301 01/06/22 0211 01/07/22 0458 01/08/22 0547 01/09/22 0539  WBC 6.6 6.7 6.4 5.7 7.3  HGB 11.4* 9.0* 9.2* 9.6* 9.2*  HCT 34.8* 27.7* 29.9* 30.7* 28.5*  MCV 88.8 88.5 92.9 91.4 89.3  PLT 270 200 159 254 295    Basic Metabolic Panel: Recent Labs  Lab 01/05/22 1301 01/06/22 0211 01/07/22 0458 01/08/22 0547 01/09/22 0539  NA 135 131* 135 139 137  K 4.2 3.6 4.1 3.9 3.5  CL 97* 99 100 100 102  CO2 27 25 25 29 28   GLUCOSE 97 119* 115* 112* 118*  BUN 19 21 19  28* 30*  CREATININE 1.06* 1.03* 0.99 0.97 0.81  CALCIUM 8.7* 7.8* 7.9* 8.7* 8.5*  MG  --   --   --   --  2.0    GFR: Estimated Creatinine Clearance: 41.1 mL/min (by C-G formula based on SCr of 0.81 mg/dL). Liver Function Tests: Recent Labs  Lab 01/05/22 1301  AST 23  ALT 14  ALKPHOS 35*  BILITOT 0.5  PROT 7.4  ALBUMIN 3.6    No results for input(s): "LIPASE",  "AMYLASE" in the last 168 hours. No results for input(s): "AMMONIA" in the last 168 hours. Coagulation Profile: Recent Labs  Lab 01/05/22 1341  INR 1.1    Cardiac Enzymes: Recent Labs  Lab 01/05/22 1644  CKTOTAL 89    BNP (last 3 results) No results for input(s): "PROBNP" in the last 8760 hours. HbA1C: No results for input(s): "HGBA1C" in the last 72 hours. CBG: Recent Labs  Lab 01/05/22 1447  GLUCAP 99    Lipid Profile: No results for input(s): "CHOL", "HDL", "LDLCALC", "TRIG", "CHOLHDL", "LDLDIRECT" in the last 72 hours. Thyroid Function Tests: No results for input(s): "TSH", "T4TOTAL", "FREET4", "T3FREE", "THYROIDAB" in the last 72 hours. Anemia Panel: No results for input(s): "VITAMINB12", "FOLATE", "FERRITIN", "TIBC", "IRON", "RETICCTPCT" in the last 72 hours. Sepsis Labs: Recent Labs  Lab 01/05/22 1341  LATICACIDVEN 1.1     Recent Results (from the past 240 hour(s))  Resp panel by RT-PCR (RSV, Flu A&B, Covid) Anterior Nasal Swab     Status: Abnormal   Collection Time: 01/05/22  1:01 PM   Specimen: Anterior Nasal Swab  Result Value Ref  Range Status   SARS Coronavirus 2 by RT PCR NEGATIVE NEGATIVE Final    Comment: (NOTE) SARS-CoV-2 target nucleic acids are NOT DETECTED.  The SARS-CoV-2 RNA is generally detectable in upper respiratory specimens during the acute phase of infection. The lowest concentration of SARS-CoV-2 viral copies this assay can detect is 138 copies/mL. A negative result does not preclude SARS-Cov-2 infection and should not be used as the sole basis for treatment or other patient management decisions. A negative result may occur with  improper specimen collection/handling, submission of specimen other than nasopharyngeal swab, presence of viral mutation(s) within the areas targeted by this assay, and inadequate number of viral copies(<138 copies/mL). A negative result must be combined with clinical observations, patient history, and  epidemiological information. The expected result is Negative.  Fact Sheet for Patients:  EntrepreneurPulse.com.au  Fact Sheet for Healthcare Providers:  IncredibleEmployment.be  This test is no t yet approved or cleared by the [place] FDA and  has been authorized for detection and/or diagnosis of SARS-CoV-2 by FDA under an Emergency Use Authorization (EUA). This EUA will remain  in effect (meaning this test can be used) for the duration of the COVID-19 declaration under Section 564(b)(1) of the Act, 21 U.S.C.section 360bbb-3(b)(1), unless the authorization is terminated  or revoked sooner.       Influenza A by PCR NEGATIVE NEGATIVE Final   Influenza B by PCR NEGATIVE NEGATIVE Final    Comment: (NOTE) The Xpert Xpress SARS-CoV-2/FLU/RSV plus assay is intended as an aid in the diagnosis of influenza from Nasopharyngeal swab specimens and should not be used as a sole basis for treatment. Nasal washings and aspirates are unacceptable for Xpert Xpress SARS-CoV-2/FLU/RSV testing.  Fact Sheet for Patients: EntrepreneurPulse.com.au  Fact Sheet for Healthcare Providers: IncredibleEmployment.be  This test is not yet approved or cleared by the [place] FDA and has been authorized for detection and/or diagnosis of SARS-CoV-2 by FDA under an Emergency Use Authorization (EUA). This EUA will remain in effect (meaning this test can be used) for the duration of the COVID-19 declaration under Section 564(b)(1) of the Act, 21 U.S.C. section 360bbb-3(b)(1), unless the authorization is terminated or revoked.     Resp Syncytial Virus by PCR POSITIVE (A) NEGATIVE Final    Comment: (NOTE) Fact Sheet for Patients: EntrepreneurPulse.com.au  Fact Sheet for Healthcare Providers: IncredibleEmployment.be  This test is not yet approved or cleared by the [place] FDA and has  been authorized for detection and/or diagnosis of SARS-CoV-2 by FDA under an Emergency Use Authorization (EUA). This EUA will remain in effect (meaning this test can be used) for the duration of the COVID-19 declaration under Section 564(b)(1) of the Act, 21 U.S.C. section 360bbb-3(b)(1), unless the authorization is terminated or revoked.  Performed at [facility], [place] [address] [place], [place] [address]   Blood Culture (routine x 2)     Status: None (Preliminary result)   Collection Time: 01/05/22  1:41 PM   Specimen: BLOOD  Result Value Ref Range Status   Specimen Description   Final    BLOOD SITE NOT SPECIFIED Performed at [facility] [address] [place] [address], [place], [place] [address]    Special Requests   Final    BOTTLES DRAWN AEROBIC AND ANAEROBIC Blood Culture results may not be optimal due to an inadequate volume of blood received in culture bottles Performed at [facility] [address] [place], [place] [address]    Culture   Final  NO GROWTH 2 DAYS Performed at [facility], [place] [address], [place], [place] [address]    Report Status PENDING  Incomplete  Blood Culture (routine x 2)     Status: None (Preliminary result)   Collection Time: 01/05/22  3:29 PM   Specimen: BLOOD  Result Value Ref Range Status   Specimen Description   Final    BLOOD LEFT ANTECUBITAL Performed at [facility] [address] [place], [place] [address]    Special Requests   Final    BOTTLES DRAWN AEROBIC AND ANAEROBIC Blood Culture adequate volume Performed at [facility] [address], [place], [place] [address]    Culture   Final    NO GROWTH 2 DAYS Performed at [facility] [address], [place], [place] [address]    Report Status PENDING  Incomplete  Urine Culture     Status: Abnormal   Collection Time: 01/05/22  6:27 PM   Specimen: In/Out Cath Urine  Result Value Ref Range  Status   Specimen Description   Final    IN/OUT CATH URINE Performed at [facility] [address], [place], [place] [address]    Special Requests   Final    NONE Performed at [facility], [place] [address], [place], [place] [address]    Culture MULTIPLE SPECIES PRESENT, SUGGEST RECOLLECTION (A)  Final   Report Status 01/06/2022 FINAL  Final  MRSA Next Gen by PCR, Nasal     Status: None   Collection Time: 01/05/22 11:49 PM  Result Value Ref Range Status   MRSA by PCR Next Gen NOT DETECTED NOT DETECTED Final    Comment: (NOTE) The GeneXpert MRSA Assay (FDA approved for NASAL specimens only), is one component of a comprehensive MRSA colonization surveillance program. It is not intended to diagnose MRSA infection nor to guide or monitor treatment for MRSA infections. Test performance is not FDA approved in patients less than 56 years old. Performed at [facility], [place] [address], [place], [place] [address]       Radiology Studies: No results found.  Scheduled Meds:  aspirin EC  81 mg Oral QHS   citalopram  20 mg Oral Daily   donepezil  23 mg Oral Daily   enoxaparin (LOVENOX) injection  40 mg Subcutaneous Q24H   ferrous gluconate  324 mg Oral Daily   furosemide  20 mg Intravenous Daily   gabapentin  300 mg Oral TID   ipratropium-albuterol  3 mL Nebulization QID   levothyroxine  75 mcg Oral Q0600   memantine  5 mg Oral QPM   oxybutynin  10 mg Oral Daily   pantoprazole  40 mg Oral Daily   Continuous Infusions:  sodium chloride 250 mL (01/07/22 0349)   ceFEPime (MAXIPIME) IV 2 g (01/09/22 0316)     LOS: 4 days   Time spent: 35 minutes   Kodi Guerrera Loann Quill, MD [facility]  If 7PM-7AM, please contact night-coverage [email or web address] 01/09/2022, 10:59 AM

## 2022-01-10 DIAGNOSIS — Z7189 Other specified counseling: Secondary | ICD-10-CM | POA: Diagnosis not present

## 2022-01-10 DIAGNOSIS — J9601 Acute respiratory failure with hypoxia: Secondary | ICD-10-CM | POA: Diagnosis not present

## 2022-01-10 DIAGNOSIS — A419 Sepsis, unspecified organism: Secondary | ICD-10-CM | POA: Diagnosis not present

## 2022-01-10 DIAGNOSIS — Z66 Do not resuscitate: Secondary | ICD-10-CM | POA: Diagnosis not present

## 2022-01-10 LAB — CBC
HCT: 27.5 % — ABNORMAL LOW (ref 36.0–46.0)
Hemoglobin: 8.8 g/dL — ABNORMAL LOW (ref 12.0–15.0)
MCH: 28.8 pg (ref 26.0–34.0)
MCHC: 32 g/dL (ref 30.0–36.0)
MCV: 89.9 fL (ref 80.0–100.0)
Platelets: 306 10*3/uL (ref 150–400)
RBC: 3.06 MIL/uL — ABNORMAL LOW (ref 3.87–5.11)
RDW: 14.1 % (ref 11.5–15.5)
WBC: 5.8 10*3/uL (ref 4.0–10.5)
nRBC: 0 % (ref 0.0–0.2)

## 2022-01-10 LAB — CULTURE, BLOOD (ROUTINE X 2)
Culture: NO GROWTH
Culture: NO GROWTH
Special Requests: ADEQUATE

## 2022-01-10 LAB — BASIC METABOLIC PANEL
Anion gap: 8 (ref 5–15)
BUN: 32 mg/dL — ABNORMAL HIGH (ref 8–23)
CO2: 28 mmol/L (ref 22–32)
Calcium: 8.5 mg/dL — ABNORMAL LOW (ref 8.9–10.3)
Chloride: 103 mmol/L (ref 98–111)
Creatinine, Ser: 0.87 mg/dL (ref 0.44–1.00)
GFR, Estimated: >60 mL/min (ref >60)
Glucose, Bld: 96 mg/dL (ref 70–99)
Potassium: 3.3 mmol/L — ABNORMAL LOW (ref 3.5–5.1)
Sodium: 139 mmol/L (ref 135–145)

## 2022-01-10 MED ORDER — POTASSIUM CHLORIDE CRYS ER 20 MEQ PO TBCR: 40.0000 meq | EXTENDED_RELEASE_TABLET | Freq: Once | ORAL | Status: DC

## 2022-01-10 NOTE — Progress Notes (Signed)
  Daily Progress Note   Patient Name: Tracey Rosario       Date: 01/10/2022 DOB: 07/17/1926  Age: 89 y.o. MRN#: [id number] Attending Physician: Flora Lipps, MD Primary Care Physician: Simona Huh, NP Admit Date: 01/05/2022 Length of Stay: 5 days  Reason for Consultation/Follow-up: Establishing goals of care and symptom management  Subjective:   EL:FYBOFBP comfortably, no distress, no family at bedside Subjective: Review of Systems Feels breathing has improved. Objective:   Vital Signs:  BP (!) 139/44 (BP Location: Left Arm)   Pulse 84   Temp 98.3 F (36.8 C) (Oral)   Resp 17   Ht 5\' 11"  (1.803 m)   Wt 60 kg   SpO2 98%   BMI 18.45 kg/m   Physical Exam: General: sleeping, frail, on nasal cannula Cardiovascular: RRR Respiratory: No increased work of breathing noted Abdomen: not distended Extremities: muscle wasting present in all extremities  Skin: ecchymosis on UE Neuro: sleeping  Imaging:  I personally reviewed recent imaging.   Assessment & Plan:   Assessment: No significant changes or improvement today. Recommendations/Plan: Continue with plan of care outlined in 1/7 Palliative Care note. Maintain current symptom management, no changes today.  If patient remains symptomatic despite maximum doses, please call PMT at [phone] between 0700 and 1900. Outside of these hours, please call attending, as PMT does not have night coverage.  This provider spent a total of 20 minutes providing patient's care.  Includes review of EMR, discussing care with other staff members involved in patient's medical care, obtaining relevant history and information from patient and/or patient's family, and personal review of imaging and lab work. Greater than 50% of the time was spent counseling and coordinating care related to the above assessment and plan.

## 2022-01-10 NOTE — Progress Notes (Signed)
PROGRESS NOTE    Tracey Rosario  XFG:[id number] DOB: 1926/07/28 DOA: 01/05/2022 PCP: Courtney Paris, NP   Brief Narrative:   89 years old female with past medical history of dementia, anxiety, depression, GERD, hyperlipidemia, hypertension, anemia of chronic disease, sinus bradycardia patient pneumonia admitted he did end of December presented to the hospital with generalized weakness fever.  Patient lives by herself at home.  EMS was called in and was noted to be hypoxic with pulse ox of 83% on room air with a fever of 102.5 F.  She was very weak to get out of the bed and had been exposed to family members with RSV.  In the ED patient was febrile tachycardic and was requiring 5 L of oxygen by nasal cannula.  Chest x-ray showed new patchy infiltrate in the right middle and lower lobe suggestive of  multifocal pneumonia with small right pleural effusion.  She also appeared to be confused.  Code sepsis was initiated in the ED and patient received vancomycin and cefepime fluid bolus.  She was initially on 6 L of high flow nasal cannula.  Patient was then admitted hospital for further evaluation and treatment.    Assessment & Plan:   Acute hypoxemic respiratory failure and severe sepsis in the setting of multifocal pneumonia and RSV infection:  Was initially on cefepime and vancomycin.  At this time vancomycin has been de-escalated.  Will consider 7-day course of cefepime.  MRSA PCR was negative.  Blood cultures negative so far.  Legionella and strep urinary antigen was negative palliative care on board.  On Dilaudid and Ativan for symptomatic management.  Overall prognosis is guarded.  On Lasix 20 mg daily.  Continue supplemental oxygen.  Currently on 6 L of nasal cannula oxygen.  Blood cultures negative in 5 days.  Demand ischemia  Had elevated troponins.  EKG nonacute.  2D echocardiogram obtained showed EF 65 to 70%, G2 DD, no RWMA significant calcification at the noncoronary cusp, mitral  valve normal.    Mild hypokalemia.  Will replenish with orally.  Check levels in AM.   Acute on chronic diastolic CHF with grade 2 diastolic dysfunction. -BNP elevated 443.  On Lasix 20 mg IV daily.   Mild Dementia: Continue Namenda/Aricept.  Anxiety/depression: On Ativan at this time.    GERD: Continue Protonix.  Hyperlipidemia: Continue statins.  Hypothyroidism: cont synthyroid  Hypertension: BP is stable, continue holding lisinopril/Amlodipine  Anemia of chronic disease: Hemoglobin remained stable  Generalized weakness: PTOT evaluation when stable.  CKD stage IIIb: At baseline.  Creatinine of 0.8.   Goals of care currently DNR: overall prognosis poor, guarded.  Palliative care on board.  Patient with pneumonia requiring high flow oxygen with high risk of decompensation.  If continues to deteriorate will will need to discuss about further goals of care/hospice.  DVT prophylaxis: Lovenox subcu  Code Status: DNR  Family Communication:  Spoke with the patient's daughter at bedside  Disposition Plan: To be determined at this time.  PT evaluation pending.  Consultants:  Palliative care  Procedures:  None  Antimicrobials:  Cefepime IV  Vancomycin discontinued on 01/08/21  Status is: Inpatient   Subjective: Today, patient was seen and examined at bedside.  Patient complains of cough and shortness of breath and feels sleepy.   Objective: Vitals:   01/10/22 0100 01/10/22 0435 01/10/22 0800 01/10/22 1318  BP:  (!) 139/44  (!) 153/130  Pulse:    64  Resp: 20 17  17   Temp:  98.3  F (36.8 C)  97.7 F (36.5 C)  TempSrc:  Oral  Oral  SpO2:  97% 98% 92%  Weight:      Height:        Intake/Output Summary (Last 24 hours) at 01/10/2022 1421 Last data filed at 01/10/2022 0200 Gross per 24 hour  Intake 100 ml  Output 1000 ml  Net -900 ml    Filed Weights   01/05/22 1305  Weight: 60 kg    Physical examination: Body mass index is 18.45 kg/m.  General: Thinly  built built, not in obvious distress, on high flow nasal cannula, hard of hearing, ears deconditioned, elderly female HENT:   No scleral pallor or icterus noted. Oral mucosa is moist.  Chest:    Diminished breath sounds bilaterally.  Coarse breath sounds noted bilaterally. CVS: S1 &S2 heard. No murmur.  Regular rate and rhythm. Abdomen: Soft, nontender, nondistended.  Bowel sounds are heard.   Extremities: No cyanosis, clubbing or edema.  Peripheral pulses are palpable. Psych: Alert, awake and Communicative, follows commands CNS:  No cranial nerve deficits.  Moves all extremities, generalized weakness noted Skin: Warm and dry.  No rashes noted.   Data Reviewed:  I have personally reviewed the following labs and imaging studies.  CBC: Recent Labs  Lab 01/06/22 0211 01/07/22 0458 01/08/22 0547 01/09/22 0539 01/10/22 0553  WBC 6.7 6.4 5.7 7.3 5.8  HGB 9.0* 9.2* 9.6* 9.2* 8.8*  HCT 27.7* 29.9* 30.7* 28.5* 27.5*  MCV 88.5 92.9 91.4 89.3 89.9  PLT 200 159 254 295 306    Basic Metabolic Panel: Recent Labs  Lab 01/06/22 0211 01/07/22 0458 01/08/22 0547 01/09/22 0539 01/10/22 0553  NA 131* 135 139 137 139  K 3.6 4.1 3.9 3.5 3.3*  CL 99 100 100 102 103  CO2 25 25 29 28 28   GLUCOSE 119* 115* 112* 118* 96  BUN 21 19 28* 30* 32*  CREATININE 1.03* 0.99 0.97 0.81 0.87  CALCIUM 7.8* 7.9* 8.7* 8.5* 8.5*  MG  --   --   --  2.0  --     GFR: Estimated Creatinine Clearance: 38.3 mL/min (by C-G formula based on SCr of 0.87 mg/dL). Liver Function Tests: Recent Labs  Lab 01/05/22 1301  AST 23  ALT 14  ALKPHOS 35*  BILITOT 0.5  PROT 7.4  ALBUMIN 3.6    No results for input(s): "LIPASE", "AMYLASE" in the last 168 hours. No results for input(s): "AMMONIA" in the last 168 hours. Coagulation Profile: Recent Labs  Lab 01/05/22 1341  INR 1.1    Cardiac Enzymes: Recent Labs  Lab 01/05/22 1644  CKTOTAL 89    BNP (last 3 results) No results for input(s): "PROBNP" in the  last 8760 hours. HbA1C: No results for input(s): "HGBA1C" in the last 72 hours. CBG: Recent Labs  Lab 01/05/22 1447  GLUCAP 99    Lipid Profile: No results for input(s): "CHOL", "HDL", "LDLCALC", "TRIG", "CHOLHDL", "LDLDIRECT" in the last 72 hours. Thyroid Function Tests: No results for input(s): "TSH", "T4TOTAL", "FREET4", "T3FREE", "THYROIDAB" in the last 72 hours. Anemia Panel: No results for input(s): "VITAMINB12", "FOLATE", "FERRITIN", "TIBC", "IRON", "RETICCTPCT" in the last 72 hours. Sepsis Labs: Recent Labs  Lab 01/05/22 1341  LATICACIDVEN 1.1     Recent Results (from the past 240 hour(s))  Resp panel by RT-PCR (RSV, Flu A&B, Covid) Anterior Nasal Swab     Status: Abnormal   Collection Time: 01/05/22  1:01 PM   Specimen: Anterior Nasal Swab  Result  Value Ref Range Status   SARS Coronavirus 2 by RT PCR NEGATIVE NEGATIVE Final    Comment: (NOTE) SARS-CoV-2 target nucleic acids are NOT DETECTED.  The SARS-CoV-2 RNA is generally detectable in upper respiratory specimens during the acute phase of infection. The lowest concentration of SARS-CoV-2 viral copies this assay can detect is 138 copies/mL. A negative result does not preclude SARS-Cov-2 infection and should not be used as the sole basis for treatment or other patient management decisions. A negative result may occur with  improper specimen collection/handling, submission of specimen other than nasopharyngeal swab, presence of viral mutation(s) within the areas targeted by this assay, and inadequate number of viral copies(<138 copies/mL). A negative result must be combined with clinical observations, patient history, and epidemiological information. The expected result is Negative.  Fact Sheet for Patients:  EntrepreneurPulse.com.au  Fact Sheet for Healthcare Providers:  IncredibleEmployment.be  This test is no t yet approved or cleared by the [place] FDA and  has  been authorized for detection and/or diagnosis of SARS-CoV-2 by FDA under an Emergency Use Authorization (EUA). This EUA will remain  in effect (meaning this test can be used) for the duration of the COVID-19 declaration under Section 564(b)(1) of the Act, 21 U.S.C.section 360bbb-3(b)(1), unless the authorization is terminated  or revoked sooner.       Influenza A by PCR NEGATIVE NEGATIVE Final   Influenza B by PCR NEGATIVE NEGATIVE Final    Comment: (NOTE) The Xpert Xpress SARS-CoV-2/FLU/RSV plus assay is intended as an aid in the diagnosis of influenza from Nasopharyngeal swab specimens and should not be used as a sole basis for treatment. Nasal washings and aspirates are unacceptable for Xpert Xpress SARS-CoV-2/FLU/RSV testing.  Fact Sheet for Patients: EntrepreneurPulse.com.au  Fact Sheet for Healthcare Providers: IncredibleEmployment.be  This test is not yet approved or cleared by the [place] FDA and has been authorized for detection and/or diagnosis of SARS-CoV-2 by FDA under an Emergency Use Authorization (EUA). This EUA will remain in effect (meaning this test can be used) for the duration of the COVID-19 declaration under Section 564(b)(1) of the Act, 21 U.S.C. section 360bbb-3(b)(1), unless the authorization is terminated or revoked.     Resp Syncytial Virus by PCR POSITIVE (A) NEGATIVE Final    Comment: (NOTE) Fact Sheet for Patients: EntrepreneurPulse.com.au  Fact Sheet for Healthcare Providers: IncredibleEmployment.be  This test is not yet approved or cleared by the [place] FDA and has been authorized for detection and/or diagnosis of SARS-CoV-2 by FDA under an Emergency Use Authorization (EUA). This EUA will remain in effect (meaning this test can be used) for the duration of the COVID-19 declaration under Section 564(b)(1) of the Act, 21 U.S.C. section 360bbb-3(b)(1), unless  the authorization is terminated or revoked.  Performed at [facility], [place] [address], [place], [place] [address]   Blood Culture (routine x 2)     Status: None   Collection Time: 01/05/22  1:41 PM   Specimen: BLOOD  Result Value Ref Range Status   Specimen Description   Final    BLOOD SITE NOT SPECIFIED Performed at [facility] [address] [place], [place] [address]    Special Requests   Final    BOTTLES DRAWN AEROBIC AND ANAEROBIC Blood Culture results may not be optimal due to an inadequate volume of blood received in culture bottles Performed at [facility] [address] [place], [place] [address]    Culture   Final  NO GROWTH 5 DAYS Performed at [facility], [address] [place], [place] [address]    Report Status 01/10/2022 FINAL  Final  Blood Culture (routine x 2)     Status: None   Collection Time: 01/05/22  3:29 PM   Specimen: BLOOD  Result Value Ref Range Status   Specimen Description   Final    BLOOD LEFT ANTECUBITAL Performed at [facility], [address], [place], [place] [address]    Special Requests   Final    BOTTLES DRAWN AEROBIC AND ANAEROBIC Blood Culture adequate volume Performed at [facility], [address], [place], [place] [address]    Culture   Final    NO GROWTH 5 DAYS Performed at [facility], [address] [place], [place] [address]    Report Status 01/10/2022 FINAL  Final  Urine Culture     Status: Abnormal   Collection Time: 01/05/22  6:27 PM   Specimen: In/Out Cath Urine  Result Value Ref Range Status   Specimen Description   Final    IN/OUT CATH URINE Performed at [facility], [address] [place], [place] [address]    Special Requests   Final    NONE Performed at [facility], [address], [place], [place] [address]    Culture MULTIPLE SPECIES PRESENT, SUGGEST RECOLLECTION  (A)  Final   Report Status 01/06/2022 FINAL  Final  MRSA Next Gen by PCR, Nasal     Status: None   Collection Time: 01/05/22 11:49 PM  Result Value Ref Range Status   MRSA by PCR Next Gen NOT DETECTED NOT DETECTED Final    Comment: (NOTE) The GeneXpert MRSA Assay (FDA approved for NASAL specimens only), is one component of a comprehensive MRSA colonization surveillance program. It is not intended to diagnose MRSA infection nor to guide or monitor treatment for MRSA infections. Test performance is not FDA approved in patients less than 20 years old. Performed at [facility], [address] [place], [place] [address]       Radiology Studies: No results found.  Scheduled Meds:  aspirin EC  81 mg Oral QHS   citalopram  20 mg Oral Daily   donepezil  23 mg Oral Daily   enoxaparin (LOVENOX) injection  40 mg Subcutaneous Q24H   ferrous gluconate  324 mg Oral Daily   furosemide  20 mg Intravenous Daily   gabapentin  300 mg Oral TID   levothyroxine  75 mcg Oral Q0600   memantine  5 mg Oral QPM   oxybutynin  10 mg Oral Daily   pantoprazole  40 mg Oral Daily   potassium chloride  40 mEq Oral Once   Continuous Infusions:  sodium chloride 250 mL (01/07/22 0349)   ceFEPime (MAXIPIME) IV Stopped (01/10/22 1354)     LOS: 5 days    Joycelyn Das, MD [facility] If 7PM-7AM, please contact night-coverage [email or web address] 01/10/2022, 2:21 PM

## 2022-01-10 NOTE — Plan of Care (Signed)
  Problem: Education: Goal: Knowledge of General Education information will improve Description: Including pain rating scale, medication(s)/side effects and non-pharmacologic comfort measures Outcome: Not Progressing   Problem: Health Behavior/Discharge Planning: Goal: Ability to manage health-related needs will improve Outcome: Not Progressing   

## 2022-01-11 DIAGNOSIS — Z66 Do not resuscitate: Secondary | ICD-10-CM | POA: Diagnosis not present

## 2022-01-11 DIAGNOSIS — Z7189 Other specified counseling: Secondary | ICD-10-CM | POA: Diagnosis not present

## 2022-01-11 DIAGNOSIS — J9601 Acute respiratory failure with hypoxia: Secondary | ICD-10-CM | POA: Diagnosis not present

## 2022-01-11 DIAGNOSIS — A419 Sepsis, unspecified organism: Secondary | ICD-10-CM | POA: Diagnosis not present

## 2022-01-11 MED ORDER — AMLODIPINE BESYLATE 10 MG PO TABS
10.0000 mg | ORAL_TABLET | Freq: Every day | ORAL | Status: DC
  Administered 2022-01-11 – 2022-01-14 (×4): 10 mg via ORAL
  Filled 2022-01-11 (×4): qty 1

## 2022-01-11 MED ORDER — LORAZEPAM 2 MG/ML IJ SOLN: 0.5000 mg | Freq: Four times a day (QID) | INTRAMUSCULAR | Status: DC | PRN

## 2022-01-11 MED ORDER — POTASSIUM CHLORIDE CRYS ER 20 MEQ PO TBCR
40.0000 meq | EXTENDED_RELEASE_TABLET | Freq: Once | ORAL | Status: AC
  Administered 2022-01-11: 40 meq via ORAL
  Filled 2022-01-11: qty 2

## 2022-01-11 MED ORDER — HYDROMORPHONE HCL 1 MG/ML IJ SOLN: 0.5000 mg | INTRAMUSCULAR | Status: DC | PRN

## 2022-01-11 NOTE — Progress Notes (Signed)
Pharmacy Antibiotic Note  Tracey Rosario is a 89 y.o. female admitted on 01/05/2022 with pneumonia, code sepsis.  Pharmacy has been consulted for cefepime dosing.  De-escalated off Vancomycin with negative MRSA PCR result.    Today, 01/11/2022 Day #7 Cefepime - Afebrile - WBC 5.8 yesterday - SCr improved 0.87, CrCl 38 ml/min  Plan: Continue Cefepime 2g IV q12h Follow up renal function & cultures Recommend 7 day course - today would be last day  Height: 5\' 11"  (180.3 cm) Weight: 60 kg (132 lb 4.4 oz) IBW/kg (Calculated) : 70.8  Temp (24hrs), Avg:98.3 F (36.8 C), Min:97.7 F (36.5 C), Max:98.8 F (37.1 C)  Recent Labs  Lab 01/05/22 1341 01/06/22 0211 01/07/22 0458 01/08/22 0547 01/09/22 0539 01/10/22 0553  WBC  --  6.7 6.4 5.7 7.3 5.8  CREATININE  --  1.03* 0.99 0.97 0.81 0.87  LATICACIDVEN 1.1  --   --   --   --   --      Estimated Creatinine Clearance: 38.3 mL/min (by C-G formula based on SCr of 0.87 mg/dL).    Allergies  Allergen Reactions   Codeine Other (See Comments)    Altered mental status    Antimicrobials this admission: 1/3 Vanc >> 1/6 1/3 Cefepime >>   Dose adjustments this admission:   Microbiology results: 1/3 BCx: NGF 1/3 UCx: multiple sp 1/3 Resp panel: positive for RSV  1/3 MRSA PCR: negative  1/3 Urine strep Ag: neg 1/3 Urine legionella Ag: neg  Thank you for allowing pharmacy to be a part of this patient's care.  Peggyann Juba, PharmD, BCPS [facility] main pharmacy [phone] 01/11/2022 7:19 AM

## 2022-01-11 NOTE — Progress Notes (Signed)
Weaning Oxygen Patient down to 2l [place] and oxygen saturation dropping to 87-88%. Oxygen turned to 3l [place] with improvement of oxygen saturation 91%

## 2022-01-11 NOTE — Progress Notes (Signed)
PROGRESS NOTE    Tracey Rosario  KYH:[id number] DOB: September 21, 1926 DOA: 01/05/2022 PCP: Courtney Paris, NP   Brief Narrative:   89 years old female with past medical history of dementia, anxiety, depression, GERD, hyperlipidemia, hypertension, anemia of chronic disease, sinus bradycardia, recent pneumonia who was admitted  end of December presented to the hospital with generalized weakness, fever.  Patient lives by herself at home.  EMS was called in and was noted to be hypoxic with pulse ox of 83% on room air with a fever of 102.5 F.  She was very weak to get out of the bed and had been exposed to family members with RSV.  In the ED, patient was febrile, tachycardic and was requiring 5 L of oxygen by nasal cannula.  Chest x-ray showed new patchy infiltrate in the right middle and lower lobe suggestive of  multifocal pneumonia with small right pleural effusion.  She also appeared to be confused.  Code sepsis was initiated in the ED and patient received vancomycin and cefepime fluid bolus.  She was initially on 6 L of high flow nasal cannula.  Patient was then admitted hospital for further evaluation and treatment.    Assessment & Plan:   Acute hypoxemic respiratory failure and severe sepsis in the setting of multifocal pneumonia and RSV infection: Was initially on cefepime and vancomycin.  Vancomycin was de-escalated and has completed 7-day course of cefepime at this time. MRSA PCR was negative.  Blood cultures negative  Legionella and strep urinary antigen was negative. Palliative care on board care..  On Dilaudid and Ativan for symptomatic management but will decrease the doses and frequency..  Overall prognosis is guarded.  On Lasix 20 mg IV daily.  Still on 4 L of oxygen by nasal cannula.  Will continue to wean as able.  Will add strict intake and output charting Daily weights.  Demand ischemia  Had elevated troponins.  EKG nonacute.  2D echocardiogram obtained showed EF 65 to 70%, G2 DD,  no RWMA significant calcification at the noncoronary cusp, mitral valve normal.  No mention of further chest pain.  Mild hypokalemia.  Replenished orally.  Check levels in AM.   Acute on chronic diastolic CHF with grade 2 diastolic dysfunction. -BNP elevated 443.  On Lasix 20 mg IV daily.   Mild Dementia: Continue Namenda/Aricept.  Anxiety/depression: On Ativan at this time.    GERD: Continue Protonix.  Hyperlipidemia: Continue statins.  Hypothyroidism: cont synthyroid  Hypertension: Lisinopril and amlodipine on hold.  Will resume amlodipine today.  Anemia of chronic disease: Hemoglobin remained stable.  Latest hemoglobin of 8.8.  Generalized weakness: PT, evaluation pending.  CKD stage IIIb: At baseline.  Creatinine of 0.8.   Goals of care currently DNR: overall prognosis poor, guarded.  Palliative care on board.  Patient with pneumonia requiring high flow oxygen with high risk of decompensation.  If continues to deteriorate will will need to discuss about further goals of care/hospice.  DVT prophylaxis: Lovenox subcu  Code Status: DNR  Family Communication:  I again spoke with the patient's daughter at bedside on 01/11/22  Disposition Plan: To be determined at this time.  PT evaluation pending.  Will likely need skilled nursing facility placement.  Consultants:  Palliative care  Procedures:  None  Antimicrobials:  Currently off.  Status is: Inpatient   Subjective: Today, patient was seen and examined at bedside.  Appears little sleepy from Dilaudid.  Patient's daughter at bedside.  Patient denies any nausea vomiting fever chills.  Hard of hearing.  Has mild cough.  Still on 4 L of oxygen by nasal cannula.  Objective: Vitals:   01/10/22 1318 01/10/22 2034 01/11/22 0609 01/11/22 0754  BP: (!) 153/130 (!) 148/81 (!) 150/60 (!) 134/55  Pulse: 64 94 67 73  Resp: 17 (!) 28  20  Temp: 97.7 F (36.5 C) 98.8 F (37.1 C) 98.3 F (36.8 C)   TempSrc: Oral Oral Oral    SpO2: 92% 95% 93% 93%  Weight:      Height:        Intake/Output Summary (Last 24 hours) at 01/11/2022 1106 Last data filed at 01/11/2022 0314 Gross per 24 hour  Intake 220 ml  Output 0 ml  Net 220 ml    Filed Weights   01/05/22 1305  Weight: 60 kg    Physical examination:  Body mass index is 18.45 kg/m.   General: Hard of hearing, elderly female appears deconditioned and weak, not in obvious distress, on 4 L of oxygen by nasal cannula.  HENT:   No scleral pallor or icterus noted. Oral mucosa is moist.  Chest:    Diminished breath sounds bilaterally.   CVS: S1 &S2 heard. No murmur.  Regular rate and rhythm. Abdomen: Soft, nontender, nondistended.  Bowel sounds are heard.   Extremities: No cyanosis, clubbing or edema.  Peripheral pulses are palpable. Psych: Alert, awake and Communicative, hard of hearing, follows commands,  CNS:  No cranial nerve deficits.  Moves all extremities, generalized weakness noted. Skin: Warm and dry.  No rashes noted.   Data Reviewed:  I have personally reviewed the following labs and imaging studies.  CBC: Recent Labs  Lab 01/06/22 0211 01/07/22 0458 01/08/22 0547 01/09/22 0539 01/10/22 0553  WBC 6.7 6.4 5.7 7.3 5.8  HGB 9.0* 9.2* 9.6* 9.2* 8.8*  HCT 27.7* 29.9* 30.7* 28.5* 27.5*  MCV 88.5 92.9 91.4 89.3 89.9  PLT 200 159 254 295 306    Basic Metabolic Panel: Recent Labs  Lab 01/06/22 0211 01/07/22 0458 01/08/22 0547 01/09/22 0539 01/10/22 0553  NA 131* 135 139 137 139  K 3.6 4.1 3.9 3.5 3.3*  CL 99 100 100 102 103  CO2 25 25 29 28 28   GLUCOSE 119* 115* 112* 118* 96  BUN 21 19 28* 30* 32*  CREATININE 1.03* 0.99 0.97 0.81 0.87  CALCIUM 7.8* 7.9* 8.7* 8.5* 8.5*  MG  --   --   --  2.0  --     GFR: Estimated Creatinine Clearance: 38.3 mL/min (by C-G formula based on SCr of 0.87 mg/dL). Liver Function Tests: Recent Labs  Lab 01/05/22 1301  AST 23  ALT 14  ALKPHOS 35*  BILITOT 0.5  PROT 7.4  ALBUMIN 3.6    No  results for input(s): "LIPASE", "AMYLASE" in the last 168 hours. No results for input(s): "AMMONIA" in the last 168 hours. Coagulation Profile: Recent Labs  Lab 01/05/22 1341  INR 1.1    Cardiac Enzymes: Recent Labs  Lab 01/05/22 1644  CKTOTAL 89    BNP (last 3 results) No results for input(s): "PROBNP" in the last 8760 hours. HbA1C: No results for input(s): "HGBA1C" in the last 72 hours. CBG: Recent Labs  Lab 01/05/22 1447  GLUCAP 99    Lipid Profile: No results for input(s): "CHOL", "HDL", "LDLCALC", "TRIG", "CHOLHDL", "LDLDIRECT" in the last 72 hours. Thyroid Function Tests: No results for input(s): "TSH", "T4TOTAL", "FREET4", "T3FREE", "THYROIDAB" in the last 72 hours. Anemia Panel: No results for input(s): "VITAMINB12", "FOLATE", "  FERRITIN", "TIBC", "IRON", "RETICCTPCT" in the last 72 hours. Sepsis Labs: Recent Labs  Lab 01/05/22 1341  LATICACIDVEN 1.1     Recent Results (from the past 240 hour(s))  Resp panel by RT-PCR (RSV, Flu A&B, Covid) Anterior Nasal Swab     Status: Abnormal   Collection Time: 01/05/22  1:01 PM   Specimen: Anterior Nasal Swab  Result Value Ref Range Status   SARS Coronavirus 2 by RT PCR NEGATIVE NEGATIVE Final    Comment: (NOTE) SARS-CoV-2 target nucleic acids are NOT DETECTED.  The SARS-CoV-2 RNA is generally detectable in upper respiratory specimens during the acute phase of infection. The lowest concentration of SARS-CoV-2 viral copies this assay can detect is 138 copies/mL. A negative result does not preclude SARS-Cov-2 infection and should not be used as the sole basis for treatment or other patient management decisions. A negative result may occur with  improper specimen collection/handling, submission of specimen other than nasopharyngeal swab, presence of viral mutation(s) within the areas targeted by this assay, and inadequate number of viral copies(<138 copies/mL). A negative result must be combined with clinical  observations, patient history, and epidemiological information. The expected result is Negative.  Fact Sheet for Patients:  EntrepreneurPulse.com.au  Fact Sheet for Healthcare Providers:  IncredibleEmployment.be  This test is no t yet approved or cleared by the [place] FDA and  has been authorized for detection and/or diagnosis of SARS-CoV-2 by FDA under an Emergency Use Authorization (EUA). This EUA will remain  in effect (meaning this test can be used) for the duration of the COVID-19 declaration under Section 564(b)(1) of the Act, 21 U.S.C.section 360bbb-3(b)(1), unless the authorization is terminated  or revoked sooner.       Influenza A by PCR NEGATIVE NEGATIVE Final   Influenza B by PCR NEGATIVE NEGATIVE Final    Comment: (NOTE) The Xpert Xpress SARS-CoV-2/FLU/RSV plus assay is intended as an aid in the diagnosis of influenza from Nasopharyngeal swab specimens and should not be used as a sole basis for treatment. Nasal washings and aspirates are unacceptable for Xpert Xpress SARS-CoV-2/FLU/RSV testing.  Fact Sheet for Patients: EntrepreneurPulse.com.au  Fact Sheet for Healthcare Providers: IncredibleEmployment.be  This test is not yet approved or cleared by the [place] FDA and has been authorized for detection and/or diagnosis of SARS-CoV-2 by FDA under an Emergency Use Authorization (EUA). This EUA will remain in effect (meaning this test can be used) for the duration of the COVID-19 declaration under Section 564(b)(1) of the Act, 21 U.S.C. section 360bbb-3(b)(1), unless the authorization is terminated or revoked.     Resp Syncytial Virus by PCR POSITIVE (A) NEGATIVE Final    Comment: (NOTE) Fact Sheet for Patients: EntrepreneurPulse.com.au  Fact Sheet for Healthcare Providers: IncredibleEmployment.be  This test is not yet approved or cleared  by the [place] FDA and has been authorized for detection and/or diagnosis of SARS-CoV-2 by FDA under an Emergency Use Authorization (EUA). This EUA will remain in effect (meaning this test can be used) for the duration of the COVID-19 declaration under Section 564(b)(1) of the Act, 21 U.S.C. section 360bbb-3(b)(1), unless the authorization is terminated or revoked.  Performed at [facility], [place] [address], [place], [place] [address]   Blood Culture (routine x 2)     Status: None   Collection Time: 01/05/22  1:41 PM   Specimen: BLOOD  Result Value Ref Range Status   Specimen Description   Final    BLOOD SITE NOT SPECIFIED Performed at [facility], [address] [place], [place] [address]    Special Requests   Final    BOTTLES DRAWN AEROBIC AND ANAEROBIC Blood Culture results may not be optimal due to an inadequate volume of blood received in culture bottles Performed at [facility], [address] [place], [place] [address]    Culture   Final    NO GROWTH 5 DAYS Performed at [facility], [address] [place], [place] [address]    Report Status 01/10/2022 FINAL  Final  Blood Culture (routine x 2)     Status: None   Collection Time: 01/05/22  3:29 PM   Specimen: BLOOD  Result Value Ref Range Status   Specimen Description   Final    BLOOD LEFT ANTECUBITAL Performed at [facility], [address] [place], [place] [address]    Special Requests   Final    BOTTLES DRAWN AEROBIC AND ANAEROBIC Blood Culture adequate volume Performed at [facility], [address], [place], [place] [address]    Culture   Final    NO GROWTH 5 DAYS Performed at [facility], [address] [place], [place] [address]    Report Status 01/10/2022 FINAL  Final  Urine Culture     Status: Abnormal   Collection Time: 01/05/22  6:27 PM   Specimen: In/Out Cath Urine  Result Value Ref Range  Status   Specimen Description   Final    IN/OUT CATH URINE Performed at [facility], [address] [place], [place] [address]    Special Requests   Final    NONE Performed at [facility], [address] [place], [place] [address]    Culture MULTIPLE SPECIES PRESENT, SUGGEST RECOLLECTION (A)  Final   Report Status 01/06/2022 FINAL  Final  MRSA Next Gen by PCR, Nasal     Status: None   Collection Time: 01/05/22 11:49 PM  Result Value Ref Range Status   MRSA by PCR Next Gen NOT DETECTED NOT DETECTED Final    Comment: (NOTE) The GeneXpert MRSA Assay (FDA approved for NASAL specimens only), is one component of a comprehensive MRSA colonization surveillance program. It is not intended to diagnose MRSA infection nor to guide or monitor treatment for MRSA infections. Test performance is not FDA approved in patients less than 20 years old. Performed at [facility], [address] [place], [place] [address]       Radiology Studies: No results found.  Scheduled Meds:  aspirin EC  81 mg Oral QHS   citalopram  20 mg Oral Daily   donepezil  23 mg Oral Daily   enoxaparin (LOVENOX) injection  40 mg Subcutaneous Q24H   ferrous gluconate  324 mg Oral Daily   furosemide  20 mg Intravenous Daily   gabapentin  300 mg Oral TID   levothyroxine  75 mcg Oral Q0600   memantine  5 mg Oral QPM   oxybutynin  10 mg Oral Daily   pantoprazole  40 mg Oral Daily   Continuous Infusions:  sodium chloride 250 mL (01/07/22 0349)   ceFEPime (MAXIPIME) IV 2 g (01/11/22 0314)     LOS: 6 days    Joycelyn Das, MD [facility] If 7PM-7AM, please contact night-coverage [email or web address] 01/11/2022, 11:06 AM

## 2022-01-11 NOTE — Evaluation (Signed)
Physical Therapy Evaluation Patient Details Name: Tracey Rosario MRN: [id number] DOB: 28-Feb-1926 Today's Date: 01/11/2022  History of Present Illness  89 yo female admitted with severe sepsis, acute respiratory failure, + RSV, Pna. Hx of mild dementia, depression, anxiety, anemia, R hip fx, bradycardia, toxice encephalopathy  Clinical Impression  On eval, pt required Max A for mobility. She sat EOB for ~ 2 minutes before becoming fatigued. O2 88% on 4L with activity during session. Pt presents with general weakness, decreased activity tolerance, and impaired gait/balance. Pt exhibits a significant decline from her baseline, both cognitively and physically. Pt's daughter was present to provide history and PLOF. Tracey Rosario required repeated multimodal cueing for all mobility tasks. She is very weak. PT recommendation is for ST SNF rehab at this time. Will plan to follow pt during this hospital stay.      Recommendations for follow up therapy are one component of a multi-disciplinary discharge planning process, led by the attending physician.  Recommendations may be updated based on patient status, additional functional criteria and insurance authorization.  Follow Up Recommendations Skilled nursing-short term rehab (<3 hours/day) Can patient physically be transported by private vehicle: No    Assistance Recommended at Discharge Frequent or constant Supervision/Assistance  Patient can return home with the following  Two people to help with walking and/or transfers;Two people to help with bathing/dressing/bathroom;Assistance with cooking/housework;Assist for transportation;Help with stairs or ramp for entrance;Assistance with feeding;Direct supervision/assist for financial management    Equipment Recommendations None recommended by PT  Recommendations for Other Services       Functional Status Assessment Patient has had a recent decline in their functional status and/or demonstrates limited  ability to make significant improvements in function in a reasonable and predictable amount of time     Precautions / Restrictions Precautions Precautions: Fall Precaution Comments: monitor O2 Restrictions Weight Bearing Restrictions: No      Mobility  Bed Mobility Overal bed mobility: Needs Assistance Bed Mobility: Rolling, Supine to Sit, Sit to Supine Rolling: Mod assist   Supine to sit: Max assist Sit to supine: Max assist   General bed mobility comments: Assist required for all mobility. Repeated multimodal cueing required. ? hearing deficits in addition to cognitive deficits. Pt sat EOB for a total of ~2 minutes before fatigued-Min-Mod A for static sitting balance with pt propping L elbow on HOB    Transfers                   General transfer comment: NT-pt too weak    Ambulation/Gait                  Stairs            Wheelchair Mobility    Modified Rankin (Stroke Patients Only)       Balance Overall balance assessment: Needs assistance Sitting-balance support: Bilateral upper extremity supported, Feet supported Sitting balance-Leahy Scale: Poor                                       Pertinent Vitals/Pain Pain Assessment Pain Assessment: Faces Faces Pain Scale: No hurt    Home Living Family/patient expects to be discharged to:: Unsure Living Arrangements: Alone Available Help at Discharge: Family;Available PRN/intermittently Type of Home: House Home Access: Level entry       Home Layout: One level Home Equipment: Cane - single point      Prior  Function Prior Level of Function : Independent/Modified Independent             Mobility Comments: ambulatory without a device ADLs Comments: assist for cleaning, no longer driving, bathing/dressing mod ind     Hand Dominance        Extremity/Trunk Assessment   Upper Extremity Assessment Upper Extremity Assessment: Generalized weakness (3/5 throughout. pt  is weak.)    Lower Extremity Assessment Lower Extremity Assessment: Generalized weakness (2/5 throughout with MMT; pt is weak)    Cervical / Trunk Assessment Cervical / Trunk Assessment: Kyphotic  Communication   Communication: HOH  Cognition Arousal/Alertness: Awake/alert, Lethargic Behavior During Therapy: Flat affect Overall Cognitive Status: Impaired/Different from baseline Area of Impairment: Orientation, Memory, Safety/judgement, Problem solving, Following commands                 Orientation Level: Disoriented to, Time, Place, Situation   Memory: Decreased short-term memory Following Commands: Follows one step commands inconsistently Safety/Judgement: Decreased awareness of deficits   Problem Solving: Slow processing, Decreased initiation, Difficulty sequencing, Requires verbal cues, Requires tactile cues          General Comments      Exercises General Exercises - Upper Extremity Shoulder Flexion: AROM, Both, 5 reps, Supine General Exercises - Lower Extremity Ankle Circles/Pumps: AROM, Both, 5 reps, Supine Heel Slides: AROM, Both, 5 reps, Supine   Assessment/Plan    PT Assessment Patient needs continued PT services  PT Problem List Decreased strength;Decreased activity tolerance;Decreased balance;Decreased mobility;Decreased knowledge of use of DME;Decreased cognition       PT Treatment Interventions Gait training;Therapeutic exercise;Patient/family education;DME instruction;Therapeutic activities;Cognitive remediation;Balance training;Functional mobility training    PT Goals (Current goals can be found in the Care Plan section)  Acute Rehab PT Goals Patient Stated Goal: to get better (per daughter); pt unable to state PT Goal Formulation: Patient unable to participate in goal setting Time For Goal Achievement: 01/25/22 Potential to Achieve Goals: Fair    Frequency Min 2X/week     Co-evaluation               AM-PAC PT "6 Clicks"  Mobility  Outcome Measure Help needed turning from your back to your side while in a flat bed without using bedrails?: Total Help needed moving from lying on your back to sitting on the side of a flat bed without using bedrails?: Total Help needed moving to and from a bed to a chair (including a wheelchair)?: Total Help needed standing up from a chair using your arms (e.g., wheelchair or bedside chair)?: Total Help needed to walk in hospital room?: Total Help needed climbing 3-5 steps with a railing? : Total 6 Click Score: 6    End of Session Equipment Utilized During Treatment: Gait belt Activity Tolerance: Patient limited by fatigue Patient left: in bed;with call bell/phone within reach;with family/visitor present;with bed alarm set   PT Visit Diagnosis: Muscle weakness (generalized) (M62.81);Adult, failure to thrive (R62.7);History of falling (Z91.81)    Time: 6283-1517 PT Time Calculation (min) (ACUTE ONLY): 28 min   Charges:   PT Evaluation $PT Eval Moderate Complexity: 1 Mod PT Treatments $Therapeutic Activity: 8-22 mins           Faye Ramsay, PT Acute Rehabilitation  Office: [phone]

## 2022-01-12 DIAGNOSIS — Z66 Do not resuscitate: Secondary | ICD-10-CM | POA: Diagnosis not present

## 2022-01-12 DIAGNOSIS — A419 Sepsis, unspecified organism: Secondary | ICD-10-CM | POA: Diagnosis not present

## 2022-01-12 DIAGNOSIS — J9601 Acute respiratory failure with hypoxia: Secondary | ICD-10-CM | POA: Diagnosis not present

## 2022-01-12 DIAGNOSIS — Z7189 Other specified counseling: Secondary | ICD-10-CM | POA: Diagnosis not present

## 2022-01-12 DIAGNOSIS — E876 Hypokalemia: Secondary | ICD-10-CM | POA: Insufficient documentation

## 2022-01-12 LAB — BASIC METABOLIC PANEL
Anion gap: 10 (ref 5–15)
BUN: 35 mg/dL — ABNORMAL HIGH (ref 8–23)
CO2: 27 mmol/L (ref 22–32)
Calcium: 8.6 mg/dL — ABNORMAL LOW (ref 8.9–10.3)
Chloride: 103 mmol/L (ref 98–111)
Creatinine, Ser: 0.95 mg/dL (ref 0.44–1.00)
GFR, Estimated: 56 mL/min — ABNORMAL LOW (ref >60)
Glucose, Bld: 99 mg/dL (ref 70–99)
Potassium: 3.2 mmol/L — ABNORMAL LOW (ref 3.5–5.1)
Sodium: 140 mmol/L (ref 135–145)

## 2022-01-12 LAB — CBC
HCT: 30.4 % — ABNORMAL LOW (ref 36.0–46.0)
Hemoglobin: 9.7 g/dL — ABNORMAL LOW (ref 12.0–15.0)
MCH: 28.8 pg (ref 26.0–34.0)
MCHC: 31.9 g/dL (ref 30.0–36.0)
MCV: 90.2 fL (ref 80.0–100.0)
Platelets: 421 10*3/uL — ABNORMAL HIGH (ref 150–400)
RBC: 3.37 MIL/uL — ABNORMAL LOW (ref 3.87–5.11)
RDW: 14.2 % (ref 11.5–15.5)
WBC: 6.7 10*3/uL (ref 4.0–10.5)
nRBC: 0 % (ref 0.0–0.2)

## 2022-01-12 LAB — MAGNESIUM: Magnesium: 2 mg/dL (ref 1.7–2.4)

## 2022-01-12 MED ORDER — GERHARDT'S BUTT CREAM
TOPICAL_CREAM | Freq: Every day | CUTANEOUS | Status: DC
  Filled 2022-01-12: qty 1

## 2022-01-12 MED ORDER — POTASSIUM CHLORIDE CRYS ER 20 MEQ PO TBCR
40.0000 meq | EXTENDED_RELEASE_TABLET | Freq: Once | ORAL | Status: AC
  Administered 2022-01-12: 40 meq via ORAL
  Filled 2022-01-12: qty 2

## 2022-01-12 NOTE — TOC Initial Note (Signed)
Transition of Care [facility]) - Initial/Assessment Note    Patient Details  Name: Tracey Rosario MRN: [id number] Date of Birth: 06-Nov-1926  Transition of [facility]) CM/SW Contact:    Dessa Phi, RN Phone Number: 01/12/2022, 1:08 PM  Clinical Narrative:PT recc [facility] SNf;[name](dtr) agrees to SNF-has gone to [facility] in past(preferred on fl2)-await bed offers.                   Expected Discharge Plan: Skilled Nursing Facility Barriers to Discharge: Continued Medical Work up   Patient Goals and CMS Choice Patient states their goals for this hospitalization and ongoing recovery are::  (Rehab) CMS Medicare.gov Compare Post Acute Care list provided to:: Patient Represenative (must comment) ([name](dtr)) Choice offered to / list presented to : Adult [place] ownership interest in [facility].provided to:: Adult Children    Expected Discharge Plan and Services   Discharge Planning Services: CM Consult Post Acute Care Choice: [place] arrangements for the past 2 months: Single Family Home                                      Prior Living Arrangements/Services Living arrangements for the past 2 months: Single Family Home Lives with:: Adult Children   Do you feel safe going back to the place where you live?: Yes      Need for Family Participation in Patient Care: Yes (Comment) Care giver support system in place?: Yes (comment)   Criminal Activity/Legal Involvement Pertinent to Current Situation/Hospitalization: No - Comment as needed  Activities of Daily Living Home Assistive Devices/Equipment: None ADL Screening (condition at time of admission) Patient's cognitive ability adequate to safely complete daily activities?: No Is the patient deaf or have difficulty hearing?: No Does the patient have difficulty seeing, even when wearing glasses/contacts?: No Does the patient have difficulty concentrating, remembering, or making  decisions?: Yes Patient able to express need for assistance with ADLs?: Yes Does the patient have difficulty dressing or bathing?: Yes Independently performs ADLs?: No Communication: Needs assistance Is this a change from baseline?: Change from baseline, expected to last <3 days Dressing (OT): Needs assistance Is this a change from baseline?: Change from baseline, expected to last <3days Grooming: Needs assistance Is this a change from baseline?: Change from baseline, expected to last <3 days Feeding: Needs assistance, Dependent Is this a change from baseline?: Change from baseline, expected to last <3 days Bathing: Needs assistance, Dependent Is this a change from baseline?: Change from baseline, expected to last <3 days Toileting: Needs assistance, Dependent Is this a change from baseline?: Change from baseline, expected to last >3days In/Out Bed: Needs assistance, Dependent Is this a change from baseline?: Change from baseline, expected to last <3 days Walks in Home: Needs assistance, Dependent Is this a change from baseline?: Change from baseline, expected to last <3 days Does the patient have difficulty walking or climbing stairs?: Yes Weakness of Legs: Both Weakness of Arms/Hands: Both  Permission Sought/Granted Permission sought to share information with : Case Manager Permission granted to share information with : Yes, Verbal Permission Granted  Share Information with NAME:  (Case manager)           Emotional Assessment Appearance:: Appears stated age Attitude/Demeanor/Rapport: Gracious Affect (typically observed): Accepting Orientation: : Oriented to Self, Oriented to Place, Oriented to Situation Alcohol / Substance Use: Not Applicable Psych Involvement: No (comment)  Admission  diagnosis:  Severe sepsis ([place]) [A41.9, R65.20] Sepsis with acute hypoxic respiratory failure without septic shock, due to unspecified organism ([place]) [A41.9, R65.20, J96.01] Acute hypoxic  respiratory failure ([place]) [J96.01] Patient Active Problem List   Diagnosis Date Noted   Hypokalemia 01/12/2022   Medication management 01/07/2022   Pneumonia of right lung due to infectious organism 01/07/2022   RSV infection 01/07/2022   Palliative care encounter 01/06/2022   Acute hypoxic respiratory failure ([place]) 01/06/2022   Goals of care, counseling/discussion 01/06/2022   DNR (do not resuscitate) 01/06/2022   Shortness of breath 01/06/2022   High risk medication use 01/06/2022   Agitation 01/06/2022   Sepsis with acute hypoxic respiratory failure without septic shock ([place]) 01/06/2022   Need for emotional support 01/06/2022   Counseling and coordination of care 01/06/2022   Severe sepsis ([place]) 01/05/2022   Toxic encephalopathy 12/23/2021   Bradycardia 04/22/2019   Age-related memory disorder 11/12/2018   Other osteoporosis without current pathological fracture 01/28/2016   Memory impairment 10/15/2015   Esophageal reflux 06/11/2015   Small vessel disease ([place]) 04/30/2015   Essential hypertension 04/07/2015   Tremor 04/07/2015   Major depressive disorder with single episode, in full remission ([place]) 03/30/2015   Absolute anemia 03/30/2015   Asymptomatic PVCs 08/01/2013   Hyperlipidemia 06/04/2013   PCP:  Simona Huh, NP Pharmacy:   [id number] [address], [place] [address] Phone: [phone] Fax: [phone]     Social Determinants of Health ([place]) Social History: [place]: No Food Insecurity (01/06/2022)  Housing: Low Risk  (01/06/2022)  Transportation Needs: No Transportation Needs (01/06/2022)  Utilities: Not At Risk (01/06/2022)  Tobacco Use: Medium Risk (01/06/2022)   SDOH Interventions:     Readmission Risk Interventions     No data to display

## 2022-01-12 NOTE — NC FL2 (Signed)
[place] LEVEL OF CARE FORM     IDENTIFICATION  Patient Name: Tracey Rosario Birthdate: March 12, 1926 Sex: female Admission Date (Current Location): 01/05/2022  [employer] [facility] and [place] Number:  Herbalist and Address:  [facility],  [place], [place]      Provider Number: [id number]  Attending Physician Name and Address:  Flora Lipps, MD  Relative Name and Phone Number:   Clayborn Bigness [phone])    Current Level of Care: Hospital Recommended Level of Care: [place] Prior Approval Number:    Date Approved/Denied:   PASRR Number:  [id number])  Discharge Plan: SNF    Current Diagnoses: Patient Active Problem List   Diagnosis Date Noted   Hypokalemia 01/12/2022   Medication management 01/07/2022   Pneumonia of right lung due to infectious organism 01/07/2022   RSV infection 01/07/2022   Palliative care encounter 01/06/2022   Acute hypoxic respiratory failure ([place]) 01/06/2022   Goals of care, counseling/discussion 01/06/2022   DNR (do not resuscitate) 01/06/2022   Shortness of breath 01/06/2022   High risk medication use 01/06/2022   Agitation 01/06/2022   Sepsis with acute hypoxic respiratory failure without septic shock ([place]) 01/06/2022   Need for emotional support 01/06/2022   Counseling and coordination of care 01/06/2022   Severe sepsis ([place]) 01/05/2022   Toxic encephalopathy 12/23/2021   Bradycardia 04/22/2019   Age-related memory disorder 11/12/2018   Other osteoporosis without current pathological fracture 01/28/2016   Memory impairment 10/15/2015   Esophageal reflux 06/11/2015   Small vessel disease ([place]) 04/30/2015   Essential hypertension 04/07/2015   Tremor 04/07/2015   Major depressive disorder with single episode, in full remission ([place]) 03/30/2015   Absolute anemia 03/30/2015   Asymptomatic PVCs 08/01/2013   Hyperlipidemia 06/04/2013    Orientation  RESPIRATION BLADDER Height & Weight     Self, Situation, Place  O2 Continent Weight: 61.8 kg Height:  5\' 11"  (180.3 cm)  BEHAVIORAL SYMPTOMS/MOOD NEUROLOGICAL BOWEL NUTRITION STATUS      Continent Diet (soft)  AMBULATORY STATUS COMMUNICATION OF NEEDS Skin   Limited Assist Verbally                         Personal Care Assistance Level of Assistance  Bathing, Feeding, Dressing   Feeding assistance: Limited assistance Dressing Assistance: Limited assistance     Functional Limitations Info  Sight, Hearing, Speech Sight Info: Adequate Hearing Info: Adequate Speech Info: Adequate    SPECIAL CARE FACTORS FREQUENCY  PT (By licensed PT), OT (By licensed OT)     PT Frequency:  (5x week) OT Frequency:  (5x week)            Contractures Contractures Info: Not present    Additional Factors Info  Code Status, Allergies Code Status Info:  (DNR) Allergies Info:  (Codeine)           Current Medications (01/12/2022):  This is the current hospital active medication list Current Facility-Administered Medications  Medication Dose Route Frequency Provider Last Rate Last Admin   0.9 %  sodium chloride infusion   Intravenous PRN Antonieta Pert, MD 10 mL/hr at 01/07/22 0349 250 mL at 01/07/22 0349   acetaminophen (TYLENOL) suppository 650 mg  650 mg Rectal Q6H PRN Raenette Rover, NP   650 mg at 01/09/22 1287   Or   acetaminophen (TYLENOL) 160 MG/5ML solution 650 mg  650 mg Oral Q6H PRN Raenette Rover, NP  albuterol (PROVENTIL) (2.5 MG/3ML) 0.083% nebulizer solution 2.5 mg  2.5 mg Nebulization Q6H PRN Kc, Ramesh, MD   2.5 mg at 01/09/22 1415   amLODipine (NORVASC) tablet 10 mg  10 mg Oral Daily Pokhrel, Laxman, MD   10 mg at 01/12/22 1025   aspirin EC tablet 81 mg  81 mg Oral QHS Kc, Maren Beach, MD   81 mg at 01/06/22 2143   ceFEPIme (MAXIPIME) 2 g in sodium chloride 0.9 % 100 mL IVPB  2 g Intravenous Q12H Emiliano Dyer, RPH 200 mL/hr at 01/12/22 0501 2 g at 01/12/22 0501    citalopram (CELEXA) tablet 20 mg  20 mg Oral Daily Kc, Ramesh, MD   20 mg at 01/12/22 1025   donepezil (ARICEPT) tablet 23 mg  23 mg Oral Daily Kc, Ramesh, MD   23 mg at 01/12/22 1025   enoxaparin (LOVENOX) injection 40 mg  40 mg Subcutaneous Q24H Kc, Ramesh, MD   40 mg at 01/11/22 2130   ferrous gluconate (FERGON) tablet 324 mg  324 mg Oral Daily Kc, Maren Beach, MD   324 mg at 01/12/22 1025   furosemide (LASIX) injection 20 mg  20 mg Intravenous Daily Kc, Maren Beach, MD   20 mg at 01/12/22 1025   gabapentin (NEURONTIN) capsule 300 mg  300 mg Oral TID Antonieta Pert, MD   300 mg at 01/12/22 1025   Gerhardt's butt cream   Topical Daily Pokhrel, Laxman, MD       guaiFENesin-dextromethorphan (ROBITUSSIN DM) 100-10 MG/5ML syrup 5 mL  5 mL Oral Q4H PRN Triadhosp, Wladmits, MD   5 mL at 01/11/22 0630   HYDROmorphone (DILAUDID) injection 0.5 mg  0.5 mg Intravenous Q4H PRN Pokhrel, Laxman, MD       levothyroxine (SYNTHROID) tablet 75 mcg  75 mcg Oral Q0600 Kc, Maren Beach, MD   75 mcg at 01/12/22 0514   LORazepam (ATIVAN) injection 0.5 mg  0.5 mg Intravenous Q6H PRN Pokhrel, Laxman, MD       memantine (NAMENDA) tablet 5 mg  5 mg Oral QPM Kc, Ramesh, MD   5 mg at 01/11/22 1657   ondansetron (ZOFRAN) tablet 4 mg  4 mg Oral Q6H PRN Kc, Ramesh, MD       Or   ondansetron (ZOFRAN) injection 4 mg  4 mg Intravenous Q6H PRN Kc, Ramesh, MD       oxybutynin (DITROPAN-XL) 24 hr tablet 10 mg  10 mg Oral Daily Kc, Ramesh, MD   10 mg at 01/12/22 1025   pantoprazole (PROTONIX) EC tablet 40 mg  40 mg Oral Daily Kc, Ramesh, MD   40 mg at 01/12/22 1025   polyvinyl alcohol (LIQUIFILM TEARS) 1.4 % ophthalmic solution 1 drop  1 drop Both Eyes PRN Antonieta Pert, MD         Discharge Medications: Please see discharge summary for a list of discharge medications.  Relevant Imaging Results:  Relevant Lab Results:   Additional Information  ([id number])  Tracey Rosario, Tracey Pulse, RN

## 2022-01-12 NOTE — Progress Notes (Signed)
PROGRESS NOTE    Tracey Rosario  WGY:[id number] DOB: 05/01/1926 DOA: 01/05/2022 PCP: Courtney Paris, NP   Brief Narrative:   89 years old female with past medical history of dementia, anxiety, depression, GERD, hyperlipidemia, hypertension, anemia of chronic disease, sinus bradycardia, recent pneumonia who was admitted  end of December presented to the hospital with generalized weakness, fever.  Patient lives by herself at home.  EMS was called in and was noted to be hypoxic with pulse ox of 83% on room air with a fever of 102.5 F.  She was very weak to get out of the bed and had been exposed to family members with RSV.  In the ED, patient was febrile, tachycardic and was requiring 5 L of oxygen by nasal cannula.  Chest x-ray showed new patchy infiltrate in the right middle and lower lobe suggestive of  multifocal pneumonia with small right pleural effusion.  She also appeared to be confused.  Code sepsis was initiated in the ED and patient received vancomycin and cefepime fluid bolus.  She was initially on 6 L of high flow nasal cannula.  Patient was then admitted hospital for further evaluation and treatment.    Assessment & Plan:   Acute hypoxemic respiratory failure and severe sepsis in the setting of multifocal pneumonia and RSV infection: Was initially on cefepime and vancomycin.  Has completed course of antibiotic.  MRSA PCR was negative.  Blood cultures negative  Legionella and strep urinary antigen was negative. Palliative care on board care. On Dilaudid and Ativan for symptomatic management but will decrease the doses and frequency due to somnolence.  Overall prognosis is guarded.  On Lasix 20 mg IV daily.  Still on 4 L of oxygen by nasal cannula.  Will continue to wean as able.  Continue strict intake and output charting Daily weights.  Demand ischemia  Had elevated troponins.  EKG nonacute.  2D echocardiogram obtained showed EF 65 to 70%, G2 DD, no RWMA significant  calcification at the noncoronary cusp, mitral valve normal.  No mention of further chest pain.  Mild hypokalemia.  Potassium of 3.2 today.  Will replace orally.  Continue to monitor.   Acute on chronic diastolic CHF with grade 2 diastolic dysfunction. -BNP elevated 443.  On Lasix 20 mg IV daily.  Will change to oral likely in 1 to 2 days.   Mild Dementia: Continue Namenda/Aricept.  Anxiety/depression: On Ativan at this time.    GERD: Continue Protonix.  Hyperlipidemia: Continue statins.  Hypothyroidism: continue synthyroid  Hypertension: Lisinopril and amlodipine on hold.  Has been restarted on amlodipine.  Anemia of chronic disease: Hemoglobin remained stable.  Latest hemoglobin of 9.7.  Generalized weakness: PT evaluation recommended skilled nursing facility placement.  CKD stage IIIb: At baseline.  Creatinine of 0.8.    Goals of care currently DNR: overall prognosis poor, guarded.  Palliative care on board.  Patient with pneumonia requiring high flow oxygen with high risk of decompensation.  If continues to deteriorate will will need to discuss about further goals of care/hospice.  DVT prophylaxis: Lovenox subcu  Code Status: DNR  Family Communication:  I  spoke with the patient's daughter at bedside on 01/12/22  Disposition Plan:  Physical therapy has recommended skilled nursing facility placement.  Will consider skilled facility with supplemental oxygen on discharge.  Consultants:  Palliative care  Procedures:  None  Antimicrobials:  None  Status is: Inpatient   Subjective: Today, was seen and examined at bedside.  More alert awake as  per the patient's daughter at bedside.  Denies any pain, nausea or vomiting.  On 4-5 L of oxygen still.  Hard of hearing.   Objective: Vitals:   01/12/22 0014 01/12/22 0500 01/12/22 0505 01/12/22 1148  BP: (!) 117/51  (!) 128/53 (!) 109/59  Pulse: 78  71 76  Resp: 20   16  Temp: 98 F (36.7 C)  98.1 F (36.7 C) 99 F (37.2  C)  TempSrc: Oral   Oral  SpO2: 93%  90% 94%  Weight:  61.8 kg    Height:        Intake/Output Summary (Last 24 hours) at 01/12/2022 1359 Last data filed at 01/12/2022 0501 Gross per 24 hour  Intake 150 ml  Output 500 ml  Net -350 ml    Filed Weights   01/05/22 1305 01/12/22 0500  Weight: 60 kg 61.8 kg    Physical examination:  Body mass index is 19 kg/m.   General: Hard of hearing, alert awake Communicative, appears weak and deconditioned.  Elderly female.  On 5 L of oxygen by nasal cannula.  Thinly built. Distress, on 4 L of oxygen by nasal cannula.  HENT:   No scleral pallor or icterus noted. Oral mucosa is moist.  Chest: Diminished breath sounds bilaterally.  No obvious wheezes or crackles noted. CVS: S1 &S2 heard. No murmur.  Regular rate and rhythm. Abdomen: Soft, nontender, nondistended.  Bowel sounds are heard.   Extremities: No cyanosis, clubbing or edema.  Peripheral pulses are palpable. Psych: Alert, awake and Communicative, hard of hearing, follows commands, CNS:  No cranial nerve deficits.  Moves all extremities but generalized weakness noted. Skin: Warm and dry.  No rashes noted.   Data Reviewed:  I have personally reviewed the following labs and imaging studies.  CBC: Recent Labs  Lab 01/07/22 0458 01/08/22 0547 01/09/22 0539 01/10/22 0553 01/12/22 0520  WBC 6.4 5.7 7.3 5.8 6.7  HGB 9.2* 9.6* 9.2* 8.8* 9.7*  HCT 29.9* 30.7* 28.5* 27.5* 30.4*  MCV 92.9 91.4 89.3 89.9 90.2  PLT 159 254 295 306 421*    Basic Metabolic Panel: Recent Labs  Lab 01/07/22 0458 01/08/22 0547 01/09/22 0539 01/10/22 0553 01/12/22 0520  NA 135 139 137 139 140  K 4.1 3.9 3.5 3.3* 3.2*  CL 100 100 102 103 103  CO2 25 29 28 28 27   GLUCOSE 115* 112* 118* 96 99  BUN 19 28* 30* 32* 35*  CREATININE 0.99 0.97 0.81 0.87 0.95  CALCIUM 7.9* 8.7* 8.5* 8.5* 8.6*  MG  --   --  2.0  --  2.0    GFR: Estimated Creatinine Clearance: 36.1 mL/min (by C-G formula based on SCr  of 0.95 mg/dL). Liver Function Tests: No results for input(s): "AST", "ALT", "ALKPHOS", "BILITOT", "PROT", "ALBUMIN" in the last 168 hours.  No results for input(s): "LIPASE", "AMYLASE" in the last 168 hours. No results for input(s): "AMMONIA" in the last 168 hours. Coagulation Profile: No results for input(s): "INR", "PROTIME" in the last 168 hours.  Cardiac Enzymes: Recent Labs  Lab 01/05/22 1644  CKTOTAL 89    BNP (last 3 results) No results for input(s): "PROBNP" in the last 8760 hours. HbA1C: No results for input(s): "HGBA1C" in the last 72 hours. CBG: Recent Labs  Lab 01/05/22 1447  GLUCAP 99    Lipid Profile: No results for input(s): "CHOL", "HDL", "LDLCALC", "TRIG", "CHOLHDL", "LDLDIRECT" in the last 72 hours. Thyroid Function Tests: No results for input(s): "TSH", "T4TOTAL", "FREET4", "T3FREE", "THYROIDAB"  in the last 72 hours. Anemia Panel: No results for input(s): "VITAMINB12", "FOLATE", "FERRITIN", "TIBC", "IRON", "RETICCTPCT" in the last 72 hours. Sepsis Labs: No results for input(s): "PROCALCITON", "LATICACIDVEN" in the last 168 hours.   Recent Results (from the past 240 hour(s))  Resp panel by RT-PCR (RSV, Flu A&B, Covid) Anterior Nasal Swab     Status: Abnormal   Collection Time: 01/05/22  1:01 PM   Specimen: Anterior Nasal Swab  Result Value Ref Range Status   SARS Coronavirus 2 by RT PCR NEGATIVE NEGATIVE Final    Comment: (NOTE) SARS-CoV-2 target nucleic acids are NOT DETECTED.  The SARS-CoV-2 RNA is generally detectable in upper respiratory specimens during the acute phase of infection. The lowest concentration of SARS-CoV-2 viral copies this assay can detect is 138 copies/mL. A negative result does not preclude SARS-Cov-2 infection and should not be used as the sole basis for treatment or other patient management decisions. A negative result may occur with  improper specimen collection/handling, submission of specimen other than nasopharyngeal  swab, presence of viral mutation(s) within the areas targeted by this assay, and inadequate number of viral copies(<138 copies/mL). A negative result must be combined with clinical observations, patient history, and epidemiological information. The expected result is Negative.  Fact Sheet for Patients:  BloggerCourse.com  Fact Sheet for Healthcare Providers:  SeriousBroker.it  This test is no t yet approved or cleared by the [place] FDA and  has been authorized for detection and/or diagnosis of SARS-CoV-2 by FDA under an Emergency Use Authorization (EUA). This EUA will remain  in effect (meaning this test can be used) for the duration of the COVID-19 declaration under Section 564(b)(1) of the Act, 21 U.S.C.section 360bbb-3(b)(1), unless the authorization is terminated  or revoked sooner.       Influenza A by PCR NEGATIVE NEGATIVE Final   Influenza B by PCR NEGATIVE NEGATIVE Final    Comment: (NOTE) The Xpert Xpress SARS-CoV-2/FLU/RSV plus assay is intended as an aid in the diagnosis of influenza from Nasopharyngeal swab specimens and should not be used as a sole basis for treatment. Nasal washings and aspirates are unacceptable for Xpert Xpress SARS-CoV-2/FLU/RSV testing.  Fact Sheet for Patients: BloggerCourse.com  Fact Sheet for Healthcare Providers: SeriousBroker.it  This test is not yet approved or cleared by the [place] FDA and has been authorized for detection and/or diagnosis of SARS-CoV-2 by FDA under an Emergency Use Authorization (EUA). This EUA will remain in effect (meaning this test can be used) for the duration of the COVID-19 declaration under Section 564(b)(1) of the Act, 21 U.S.C. section 360bbb-3(b)(1), unless the authorization is terminated or revoked.     Resp Syncytial Virus by PCR POSITIVE (A) NEGATIVE Final    Comment: (NOTE) Fact Sheet  for Patients: BloggerCourse.com  Fact Sheet for Healthcare Providers: SeriousBroker.it  This test is not yet approved or cleared by the [place] FDA and has been authorized for detection and/or diagnosis of SARS-CoV-2 by FDA under an Emergency Use Authorization (EUA). This EUA will remain in effect (meaning this test can be used) for the duration of the COVID-19 declaration under Section 564(b)(1) of the Act, 21 U.S.C. section 360bbb-3(b)(1), unless the authorization is terminated or revoked.  Performed at [facility], [address], [place], [place] [address]   Blood Culture (routine x 2)     Status: None   Collection Time: 01/05/22  1:41 PM   Specimen: BLOOD  Result Value Ref Range Status   Specimen Description  Final    BLOOD SITE NOT SPECIFIED Performed at [facility], [place] [address], [place], [place] [address]    Special Requests   Final    BOTTLES DRAWN AEROBIC AND ANAEROBIC Blood Culture results may not be optimal due to an inadequate volume of blood received in culture bottles Performed at [place] [address], [place], [place] [address]    Culture   Final    NO GROWTH 5 DAYS Performed at [facility], [place] [address], [place], [place] [address]    Report Status 01/10/2022 FINAL  Final  Blood Culture (routine x 2)     Status: None   Collection Time: 01/05/22  3:29 PM   Specimen: BLOOD  Result Value Ref Range Status   Specimen Description   Final    BLOOD LEFT ANTECUBITAL Performed at [facility] [place] [address], [place], [place] [address]    Special Requests   Final    BOTTLES DRAWN AEROBIC AND ANAEROBIC Blood Culture adequate volume Performed at [facility] [place] [address], [place], [place] [address]    Culture   Final    NO GROWTH 5 DAYS Performed at [facility], [place] [address], [place], [place]  [address]    Report Status 01/10/2022 FINAL  Final  Urine Culture     Status: Abnormal   Collection Time: 01/05/22  6:27 PM   Specimen: In/Out Cath Urine  Result Value Ref Range Status   Specimen Description   Final    IN/OUT CATH URINE Performed at [facility] [address] [place], [place] [address]    Special Requests   Final    NONE Performed at [facility], [place] [address] [place], [place] [address]    Culture MULTIPLE SPECIES PRESENT, SUGGEST RECOLLECTION (A)  Final   Report Status 01/06/2022 FINAL  Final  MRSA Next Gen by PCR, Nasal     Status: None   Collection Time: 01/05/22 11:49 PM  Result Value Ref Range Status   MRSA by PCR Next Gen NOT DETECTED NOT DETECTED Final    Comment: (NOTE) The GeneXpert MRSA Assay (FDA approved for NASAL specimens only), is one component of a comprehensive MRSA colonization surveillance program. It is not intended to diagnose MRSA infection nor to guide or monitor treatment for MRSA infections. Test performance is not FDA approved in patients less than 4 years old. Performed at [facility], [place] [address] [place], [place] [address]       Radiology Studies: No results found.  Scheduled Meds:  amLODipine  10 mg Oral Daily   aspirin EC  81 mg Oral QHS   citalopram  20 mg Oral Daily   donepezil  23 mg Oral Daily   enoxaparin (LOVENOX) injection  40 mg Subcutaneous Q24H   ferrous gluconate  324 mg Oral Daily   furosemide  20 mg Intravenous Daily   gabapentin  300 mg Oral TID   Gerhardt's butt cream   Topical Daily   levothyroxine  75 mcg Oral Q0600   memantine  5 mg Oral QPM   oxybutynin  10 mg Oral Daily   pantoprazole  40 mg Oral Daily   Continuous Infusions:  sodium chloride 250 mL (01/07/22 0349)   ceFEPime (MAXIPIME) IV 2 g (01/12/22 0501)     LOS: 7 days    Flora Lipps, MD [facility] If 7PM-7AM, please contact  night-coverage [email or web address]  01/12/2022, 1:59 PM

## 2022-01-12 NOTE — Plan of Care (Signed)
  Problem: Respiratory: Goal: Ability to maintain adequate ventilation will improve Outcome: Progressing Goal: Ability to maintain a clear airway will improve Outcome: Progressing   Problem: Education: Goal: Knowledge of General Education information will improve Description: Including pain rating scale, medication(s)/side effects and non-pharmacologic comfort measures Outcome: Progressing   Problem: Clinical Measurements: Goal: Ability to maintain clinical measurements within normal limits will improve Outcome: Progressing

## 2022-01-13 DIAGNOSIS — J9601 Acute respiratory failure with hypoxia: Secondary | ICD-10-CM | POA: Diagnosis not present

## 2022-01-13 DIAGNOSIS — Z7189 Other specified counseling: Secondary | ICD-10-CM | POA: Diagnosis not present

## 2022-01-13 DIAGNOSIS — Z66 Do not resuscitate: Secondary | ICD-10-CM | POA: Diagnosis not present

## 2022-01-13 DIAGNOSIS — A419 Sepsis, unspecified organism: Secondary | ICD-10-CM | POA: Diagnosis not present

## 2022-01-13 LAB — BASIC METABOLIC PANEL
Anion gap: 8 (ref 5–15)
BUN: 35 mg/dL — ABNORMAL HIGH (ref 8–23)
CO2: 28 mmol/L (ref 22–32)
Calcium: 8.8 mg/dL — ABNORMAL LOW (ref 8.9–10.3)
Chloride: 100 mmol/L (ref 98–111)
Creatinine, Ser: 0.97 mg/dL (ref 0.44–1.00)
GFR, Estimated: 54 mL/min — ABNORMAL LOW (ref >60)
Glucose, Bld: 119 mg/dL — ABNORMAL HIGH (ref 70–99)
Potassium: 3.6 mmol/L (ref 3.5–5.1)
Sodium: 136 mmol/L (ref 135–145)

## 2022-01-13 NOTE — Care Management Important Message (Signed)
Important Message  Patient Details  Name: Tracey Rosario MRN: [id number] Date of Birth: 1926-01-17   Medicare Important Message Given:  Yes     Memory Argue 01/13/2022, 10:20 AM

## 2022-01-13 NOTE — Progress Notes (Signed)
Physical Therapy Treatment Patient Details Name: Tracey Rosario MRN: [id number] DOB: 08/18/1926 Today's Date: 01/13/2022   History of Present Illness 89 yo female admitted with severe sepsis, acute respiratory failure, + RSV, Pna. Hx of mild dementia, depression, anxiety, anemia, R hip fx, bradycardia, toxice encephalopathy    PT Comments    Pt tolerated increased activity level today. She pivoted from bed to recliner with +2 mod assist, pt has a posterior lean. SpO2 88-90% on 4L O2. SNF remains appropriate DC plan.    Recommendations for follow up therapy are one component of a multi-disciplinary discharge planning process, led by the attending physician.  Recommendations may be updated based on patient status, additional functional criteria and insurance authorization.  Follow Up Recommendations  Skilled nursing-short term rehab (<3 hours/day) Can patient physically be transported by private vehicle: No   Assistance Recommended at Discharge Frequent or constant Supervision/Assistance  Patient can return home with the following Two people to help with walking and/or transfers;Two people to help with bathing/dressing/bathroom;Assistance with cooking/housework;Assist for transportation;Help with stairs or ramp for entrance;Assistance with feeding;Direct supervision/assist for financial management   Equipment Recommendations  None recommended by PT    Recommendations for Other Services       Precautions / Restrictions Precautions Precautions: Fall Precaution Comments: monitor O2 Restrictions Weight Bearing Restrictions: No     Mobility  Bed Mobility Overal bed mobility: Needs Assistance       Supine to sit: Mod assist     General bed mobility comments: assist to raise trunk    Transfers Overall transfer level: Needs assistance Equipment used: 2 person hand held assist Transfers: Sit to/from Stand, Bed to chair/wheelchair/BSC Sit to Stand: Mod assist   Step  pivot transfers: +2 physical assistance, Mod assist       General transfer comment: assist to power up, assist to steady, posterior lean, pt took a few pivotal steps to recliner. No RW in room so did 2 person HHA.    Ambulation/Gait                   Stairs             Wheelchair Mobility    Modified Rankin (Stroke Patients Only)       Balance Overall balance assessment: Needs assistance Sitting-balance support: Bilateral upper extremity supported, Feet supported Sitting balance-Leahy Scale: Fair     Standing balance support: Bilateral upper extremity supported, Reliant on assistive device for balance, During functional activity Standing balance-Leahy Scale: Poor                              Cognition Arousal/Alertness: Awake/alert, Lethargic Behavior During Therapy: Flat affect Overall Cognitive Status: Impaired/Different from baseline Area of Impairment: Orientation, Memory, Safety/judgement, Problem solving, Following commands                 Orientation Level: Disoriented to, Time, Place, Situation   Memory: Decreased short-term memory Following Commands: Follows one step commands inconsistently Safety/Judgement: Decreased awareness of deficits   Problem Solving: Slow processing, Decreased initiation, Difficulty sequencing, Requires verbal cues, Requires tactile cues          Exercises      General Comments        Pertinent Vitals/Pain Pain Assessment Pain Score: 0-No pain Faces Pain Scale: No hurt Breathing: normal Negative Vocalization: none Facial Expression: smiling or inexpressive Body Language: relaxed Consolability: no need to console PAINAD Score: 0  Home Living                          Prior Function            PT Goals (current goals can now be found in the care plan section) Acute Rehab PT Goals Patient Stated Goal: to get better (per daughter); pt unable to state PT Goal Formulation:  Patient unable to participate in goal setting Time For Goal Achievement: 01/25/22 Potential to Achieve Goals: Fair Progress towards PT goals: Progressing toward goals    Frequency    Min 2X/week      PT Plan Current plan remains appropriate    Co-evaluation              AM-PAC PT "6 Clicks" Mobility   Outcome Measure  Help needed turning from your back to your side while in a flat bed without using bedrails?: A Lot Help needed moving from lying on your back to sitting on the side of a flat bed without using bedrails?: A Lot Help needed moving to and from a bed to a chair (including a wheelchair)?: A Lot Help needed standing up from a chair using your arms (e.g., wheelchair or bedside chair)?: A Lot Help needed to walk in hospital room?: Total Help needed climbing 3-5 steps with a railing? : Total 6 Click Score: 10    End of Session Equipment Utilized During Treatment: Oxygen;Gait belt Activity Tolerance: Patient limited by fatigue Patient left: in chair;with call bell/phone within reach;with family/visitor present Nurse Communication: Mobility status PT Visit Diagnosis: Muscle weakness (generalized) (M62.81);Adult, failure to thrive (R62.7);History of falling (Z91.81)     Time: 0017-4944 PT Time Calculation (min) (ACUTE ONLY): 14 min  Charges:  $Therapeutic Activity: 8-22 mins                     Blondell Reveal Kistler PT 01/13/2022  Acute Rehabilitation Services  Office [phone]

## 2022-01-13 NOTE — Progress Notes (Signed)
PROGRESS NOTE    Tracey Rosario  KVQ:[id number] DOB: 12/13/1926 DOA: 01/05/2022 PCP: Courtney Paris, NP   Brief Narrative:   89 years old female with past medical history of dementia, anxiety, depression, GERD, hyperlipidemia, hypertension, anemia of chronic disease, sinus bradycardia, recent pneumonia who was admitted  end of December presented to the hospital with generalized weakness, fever.  Patient lives by herself at home.  EMS was called in and was noted to be hypoxic with pulse ox of 83% on room air with a fever of 102.5 F.  She was very weak to get out of the bed and had been exposed to family members with RSV.  In the ED, patient was febrile, tachycardic and was requiring 5 L of oxygen by nasal cannula.  Chest x-ray showed new patchy infiltrate in the right middle and lower lobe suggestive of  multifocal pneumonia with small right pleural effusion.  She also appeared to be confused.  Code sepsis was initiated in the ED and patient received vancomycin and cefepime fluid bolus.  She was initially on 6 L of high flow nasal cannula.  Patient was then admitted hospital for further evaluation and treatment.    Assessment & Plan:   Acute hypoxemic respiratory failure and severe sepsis in the setting of multifocal pneumonia and RSV infection: Was initially on cefepime and vancomycin.  Has completed course of antibiotic.  MRSA PCR was negative.  Blood cultures negative  Legionella and strep urinary antigen  negative. Palliative care was consulted during hospitalization and was on Dilaudid and Ativan for symptomatic relief..  Overall prognosis is guarded.  On Lasix 20 mg IV daily.  Still on 4 L of oxygen by nasal cannula.  Will continue to wean as able.  Continue to monitor intake and output charting,  Demand ischemia  Had elevated troponins.  EKG was nonacute.  2D echocardiogram obtained showed EF 65 to 70%, G2 DD, no RWMA significant calcification at the noncoronary cusp, mitral valve  normal.  No mention of further chest pain.  Mild hypokalemia.  Improved after replacement.  Potassium 3.6 today.  Will continue to replenish orally.   Acute on chronic diastolic CHF with grade 2 diastolic dysfunction. -BNP elevated 443.  On Lasix 20 mg IV daily.  Will change to oral likely in 1 to 2 days.  Patient is positive balance for 182 ml  Output charting has not been done   Mild Dementia: Continue Namenda/Aricept.  Anxiety/depression: On Ativan at this time.    GERD: Continue Protonix.  Hyperlipidemia: Continue statins.  Hypothyroidism: continue synthyroid  Hypertension: Lisinopril on hold.  Has been restarted on amlodipine.  Blood pressure seems to be stable at this time.  Anemia of chronic disease: Hemoglobin remained stable.  Latest hemoglobin of 9.7.  Generalized weakness: PThas recommended skilled nursing facility placement.  CKD stage IIIb: At baseline.  Creatinine of 0.9.    Goals of care currently DNR: overall prognosis poor, guarded.  Palliative care on board.  Patient with pneumonia requiring high flow oxygen with high risk of decompensation.  If continues to deteriorate will will need to discuss about further goals of care/hospice.  At this time appears to be stable with slow improvement.  Considering skilled nursing facility.  DVT prophylaxis: Lovenox subcu  Code Status: DNR  Family Communication:  I  spoke with the patient's daughter at bedside on 01/13/22  Disposition Plan: Skilled nursing facility with oxygen supplementation.  Medically stable for disposition with supplemental oxygen, anticipating slow recovery  Consultants:  Palliative care  Procedures:  None  Antimicrobials:  None  Status is: Inpatient   Subjective: Today, patient was seen and examined at bedside.  Feels okay.  No nausea vomiting fever chills or rigor.  Has been eating okay.  Patient's daughter at bedside.  Hard of hearing.   Objective: Vitals:   01/12/22 1148 01/12/22  2034 01/13/22 0500 01/13/22 0615  BP: (!) 109/59 (!) 122/48  (!) 116/49  Pulse: 76 64  81  Resp: 16 17  20   Temp: 99 F (37.2 C) 98.7 F (37.1 C)  98.4 F (36.9 C)  TempSrc: Oral Oral  Oral  SpO2: 94% 97%  94%  Weight:   61.2 kg   Height:        Intake/Output Summary (Last 24 hours) at 01/13/2022 1226 Last data filed at 01/13/2022 1000 Gross per 24 hour  Intake 650 ml  Output 1 ml  Net 649 ml    Filed Weights   01/05/22 1305 01/12/22 0500 01/13/22 0500  Weight: 60 kg 61.8 kg 61.2 kg    Physical examination:  Body mass index is 18.82 kg/m.   General: Hard of hearing, thinly built, Communicative, weak and deconditioned, on 4 L of oxygen by nasal cannula.   HENT:   No scleral pallor or icterus noted. Oral mucosa is moist.  Chest: Diminished breath sounds bilaterally. CVS: S1 &S2 heard. No murmur.  Regular rate and rhythm. Abdomen: Soft, nontender, nondistended.  Bowel sounds are heard.   Extremities: No cyanosis, clubbing or edema.  Peripheral pulses are palpable. Psych: , alert awake and Communicative, CNS:  No cranial nerve deficits.  Generalized weakness noted. Skin: Warm and dry.  No rashes noted.   Data Reviewed:  I have personally reviewed the following labs and imaging studies.  CBC: Recent Labs  Lab 01/07/22 0458 01/08/22 0547 01/09/22 0539 01/10/22 0553 01/12/22 0520  WBC 6.4 5.7 7.3 5.8 6.7  HGB 9.2* 9.6* 9.2* 8.8* 9.7*  HCT 29.9* 30.7* 28.5* 27.5* 30.4*  MCV 92.9 91.4 89.3 89.9 90.2  PLT 159 254 295 306 421*    Basic Metabolic Panel: Recent Labs  Lab 01/08/22 0547 01/09/22 0539 01/10/22 0553 01/12/22 0520 01/13/22 1042  NA 139 137 139 140 136  K 3.9 3.5 3.3* 3.2* 3.6  CL 100 102 103 103 100  CO2 29 28 28 27 28   GLUCOSE 112* 118* 96 99 119*  BUN 28* 30* 32* 35* 35*  CREATININE 0.97 0.81 0.87 0.95 0.97  CALCIUM 8.7* 8.5* 8.5* 8.6* 8.8*  MG  --  2.0  --  2.0  --     GFR: Estimated Creatinine Clearance: 35 mL/min (by C-G formula based  on SCr of 0.97 mg/dL). Liver Function Tests: No results for input(s): "AST", "ALT", "ALKPHOS", "BILITOT", "PROT", "ALBUMIN" in the last 168 hours.  No results for input(s): "LIPASE", "AMYLASE" in the last 168 hours. No results for input(s): "AMMONIA" in the last 168 hours. Coagulation Profile: No results for input(s): "INR", "PROTIME" in the last 168 hours.  Cardiac Enzymes: No results for input(s): "CKTOTAL", "CKMB", "CKMBINDEX", "TROPONINI" in the last 168 hours.  BNP (last 3 results) No results for input(s): "PROBNP" in the last 8760 hours. HbA1C: No results for input(s): "HGBA1C" in the last 72 hours. CBG: No results for input(s): "GLUCAP" in the last 168 hours.  Lipid Profile: No results for input(s): "CHOL", "HDL", "LDLCALC", "TRIG", "CHOLHDL", "LDLDIRECT" in the last 72 hours. Thyroid Function Tests: No results for input(s): "TSH", "T4TOTAL", "FREET4", "T3FREE", "THYROIDAB"  in the last 72 hours. Anemia Panel: No results for input(s): "VITAMINB12", "FOLATE", "FERRITIN", "TIBC", "IRON", "RETICCTPCT" in the last 72 hours. Sepsis Labs: No results for input(s): "PROCALCITON", "LATICACIDVEN" in the last 168 hours.   Recent Results (from the past 240 hour(s))  Resp panel by RT-PCR (RSV, Flu A&B, Covid) Anterior Nasal Swab     Status: Abnormal   Collection Time: 01/05/22  1:01 PM   Specimen: Anterior Nasal Swab  Result Value Ref Range Status   SARS Coronavirus 2 by RT PCR NEGATIVE NEGATIVE Final    Comment: (NOTE) SARS-CoV-2 target nucleic acids are NOT DETECTED.  The SARS-CoV-2 RNA is generally detectable in upper respiratory specimens during the acute phase of infection. The lowest concentration of SARS-CoV-2 viral copies this assay can detect is 138 copies/mL. A negative result does not preclude SARS-Cov-2 infection and should not be used as the sole basis for treatment or other patient management decisions. A negative result may occur with  improper specimen  collection/handling, submission of specimen other than nasopharyngeal swab, presence of viral mutation(s) within the areas targeted by this assay, and inadequate number of viral copies(<138 copies/mL). A negative result must be combined with clinical observations, patient history, and epidemiological information. The expected result is Negative.  Fact Sheet for Patients:  EntrepreneurPulse.com.au  Fact Sheet for Healthcare Providers:  IncredibleEmployment.be  This test is no t yet approved or cleared by the [place] FDA and  has been authorized for detection and/or diagnosis of SARS-CoV-2 by FDA under an Emergency Use Authorization (EUA). This EUA will remain  in effect (meaning this test can be used) for the duration of the COVID-19 declaration under Section 564(b)(1) of the Act, 21 U.S.C.section 360bbb-3(b)(1), unless the authorization is terminated  or revoked sooner.       Influenza A by PCR NEGATIVE NEGATIVE Final   Influenza B by PCR NEGATIVE NEGATIVE Final    Comment: (NOTE) The Xpert Xpress SARS-CoV-2/FLU/RSV plus assay is intended as an aid in the diagnosis of influenza from Nasopharyngeal swab specimens and should not be used as a sole basis for treatment. Nasal washings and aspirates are unacceptable for Xpert Xpress SARS-CoV-2/FLU/RSV testing.  Fact Sheet for Patients: EntrepreneurPulse.com.au  Fact Sheet for Healthcare Providers: IncredibleEmployment.be  This test is not yet approved or cleared by the [place] FDA and has been authorized for detection and/or diagnosis of SARS-CoV-2 by FDA under an Emergency Use Authorization (EUA). This EUA will remain in effect (meaning this test can be used) for the duration of the COVID-19 declaration under Section 564(b)(1) of the Act, 21 U.S.C. section 360bbb-3(b)(1), unless the authorization is terminated or revoked.     Resp Syncytial  Virus by PCR POSITIVE (A) NEGATIVE Final    Comment: (NOTE) Fact Sheet for Patients: EntrepreneurPulse.com.au  Fact Sheet for Healthcare Providers: IncredibleEmployment.be  This test is not yet approved or cleared by the [place] FDA and has been authorized for detection and/or diagnosis of SARS-CoV-2 by FDA under an Emergency Use Authorization (EUA). This EUA will remain in effect (meaning this test can be used) for the duration of the COVID-19 declaration under Section 564(b)(1) of the Act, 21 U.S.C. section 360bbb-3(b)(1), unless the authorization is terminated or revoked.  Performed at [facility], [place] [address], [place], [place] [address]   Blood Culture (routine x 2)     Status: None   Collection Time: 01/05/22  1:41 PM   Specimen: BLOOD  Result Value Ref Range Status   Specimen Description  Final    BLOOD SITE NOT SPECIFIED Performed at [facility], [place] [address], [place], [place] [address]    Special Requests   Final    BOTTLES DRAWN AEROBIC AND ANAEROBIC Blood Culture results may not be optimal due to an inadequate volume of blood received in culture bottles Performed at [place] [address], [place], [place] [address]    Culture   Final    NO GROWTH 5 DAYS Performed at [facility], [place] [address], [place], [place] [address]    Report Status 01/10/2022 FINAL  Final  Blood Culture (routine x 2)     Status: None   Collection Time: 01/05/22  3:29 PM   Specimen: BLOOD  Result Value Ref Range Status   Specimen Description   Final    BLOOD LEFT ANTECUBITAL Performed at [facility] [address], [place], [place] [address]    Special Requests   Final    BOTTLES DRAWN AEROBIC AND ANAEROBIC Blood Culture adequate volume Performed at [facility] [address], [place], [place] [address]    Culture   Final    NO GROWTH 5  DAYS Performed at [facility], [place] [address], [place], [place] [address]    Report Status 01/10/2022 FINAL  Final  Urine Culture     Status: Abnormal   Collection Time: 01/05/22  6:27 PM   Specimen: In/Out Cath Urine  Result Value Ref Range Status   Specimen Description   Final    IN/OUT CATH URINE Performed at [place] [address] [place], [place] [address]    Special Requests   Final    NONE Performed at [facility], [place] [address] [place], [place] [address]    Culture MULTIPLE SPECIES PRESENT, SUGGEST RECOLLECTION (A)  Final   Report Status 01/06/2022 FINAL  Final  MRSA Next Gen by PCR, Nasal     Status: None   Collection Time: 01/05/22 11:49 PM  Result Value Ref Range Status   MRSA by PCR Next Gen NOT DETECTED NOT DETECTED Final    Comment: (NOTE) The GeneXpert MRSA Assay (FDA approved for NASAL specimens only), is one component of a comprehensive MRSA colonization surveillance program. It is not intended to diagnose MRSA infection nor to guide or monitor treatment for MRSA infections. Test performance is not FDA approved in patients less than 41 years old. Performed at [facility], [place] [address], [place],  [address]       Radiology Studies: No results found.  Scheduled Meds:  amLODipine  10 mg Oral Daily   aspirin EC  81 mg Oral QHS   citalopram  20 mg Oral Daily   donepezil  23 mg Oral Daily   enoxaparin (LOVENOX) injection  40 mg Subcutaneous Q24H   ferrous gluconate  324 mg Oral Daily   furosemide  20 mg Intravenous Daily   gabapentin  300 mg Oral TID   Gerhardt's butt cream   Topical Daily   levothyroxine  75 mcg Oral Q0600   memantine  5 mg Oral QPM   oxybutynin  10 mg Oral Daily   pantoprazole  40 mg Oral Daily   Continuous Infusions:  sodium chloride 250 mL (01/07/22 0349)     LOS: 8 days    Flora Lipps, MD [facility] If 7PM-7AM, please contact  night-coverage [email or web address] 01/13/2022, 12:26 PM

## 2022-01-13 NOTE — TOC Progression Note (Addendum)
Transition of Care [facility]) - Progression Note    Patient Details  Name: Tracey Rosario MRN: [id number] Date of Birth: 28-Feb-1926  Transition of Care [facility]) CM/SW Contact  Leeroy Cha, RN Phone Number: 01/13/2022, 9:28 AM  Clinical Narrative:    Will review accepted bed offers with the family: Continued Care and Services - Admitted Since 01/05/2022  Destination  Service Provider Request Status Selected Services Address Phone Fax Patient Preferred  [place] [facility] Preferred SNF  Accepted N/A [address], [place] [address] [phone] --  [facility] Preferred SNF  Accepted N/A [address] [place] [address] [phone] --  [facility]  Accepted N/A [place],  [place] [address] [phone] --  [facility] Preferred SNF  Accepted N/A [address] [place] [address] [phone] --  [facility] Preferred SNF  Accepted N/A [place], [place] [address] [phone] --  Internal Comment last updated by Dessa Phi, RN 01/12/2022 1305 Preferred has been there in the past.  [place] Preferred SNF  Accepted N/A [address], [place] [address] [phone] --  [facility]  Accepted N/A [address] [place] [address] [phone] --  [facility]  Accepted N/A [address], [place] [address] [phone] --  [facility] Preferred SNF  Accepted N/A [address], [place] [address] [phone] --  [facility]  Accepted N/A [place], [place] [address] [phone] --  [facility]  Accepted N/A [address], [place] [address] [phone] --  [facility] Preferred [facility]  Accepted N/A [address] [place]  [address] [phone]    1020/reviewed bed list with the Daughter-has chosen [facility].  Expected Discharge Plan: [place] Barriers to Discharge: Continued Medical Work up  Expected Discharge Plan and Services   Discharge Planning Services: CM Consult Post Acute Care Choice: [place] arrangements for the past 2 months: Single Family Home                                       Social Determinants of Health (SDOH) Interventions SDOH Screenings   Food Insecurity: No Food Insecurity (01/06/2022)  Housing: Low Risk  (01/06/2022)  Transportation Needs: No Transportation Needs (01/06/2022)  Utilities: Not At Risk (01/06/2022)  Tobacco Use: Medium Risk (01/06/2022)    Readmission Risk Interventions   Row Labels 01/12/2022    1:09 PM  Readmission Risk Prevention Plan   Section Header. No data exists in this row.   Transportation Screening   Complete  PCP or Specialist Appt within 3-5 Days   Complete  HRI or [place]   Complete  Social Work Consult for [place] Planning/Counseling   Complete  Palliative Care Screening   Complete  Medication Review Press photographer)   Complete

## 2022-01-13 NOTE — Plan of Care (Signed)
  Problem: Clinical Measurements: Goal: Ability to maintain a body temperature in the normal range will improve 01/13/2022 0455 by Fransisco Hertz, RN Outcome: Progressing 01/13/2022 0455 by Fransisco Hertz, RN Outcome: Progressing   Problem: Respiratory: Goal: Ability to maintain adequate ventilation will improve 01/13/2022 0455 by Fransisco Hertz, RN Outcome: Progressing 01/13/2022 0455 by Fransisco Hertz, RN Outcome: Progressing Goal: Ability to maintain a clear airway will improve 01/13/2022 0455 by Fransisco Hertz, RN Outcome: Progressing 01/13/2022 0455 by Fransisco Hertz, RN Outcome: Progressing   Problem: Clinical Measurements: Goal: Ability to maintain clinical measurements within normal limits will improve 01/13/2022 0455 by Fransisco Hertz, RN Outcome: Progressing 01/13/2022 0455 by Fransisco Hertz, RN Outcome: Progressing Goal: Will remain free from infection 01/13/2022 0455 by Fransisco Hertz, RN Outcome: Progressing 01/13/2022 0455 by Fransisco Hertz, RN Outcome: Progressing Goal: Diagnostic test results will improve 01/13/2022 0455 by Fransisco Hertz, RN Outcome: Progressing 01/13/2022 0455 by Fransisco Hertz, RN Outcome: Progressing Goal: Respiratory complications will improve 01/13/2022 0455 by Fransisco Hertz, RN Outcome: Progressing 01/13/2022 0455 by Fransisco Hertz, RN Outcome: Progressing Goal: Cardiovascular complication will be avoided 01/13/2022 0455 by Fransisco Hertz, RN Outcome: Progressing 01/13/2022 0455 by Fransisco Hertz, RN Outcome: Progressing   Problem: Activity: Goal: Risk for activity intolerance will decrease 01/13/2022 0455 by Fransisco Hertz, RN Outcome: Progressing 01/13/2022 0455 by Fransisco Hertz, RN Outcome: Progressing   Problem: Nutrition: Goal: Adequate nutrition will be maintained 01/13/2022 0455 by Fransisco Hertz, RN Outcome: Progressing 01/13/2022 0455 by Fransisco Hertz, RN Outcome: Progressing   Problem: Coping: Goal: Level of anxiety will decrease 01/13/2022 0455 by Fransisco Hertz, RN Outcome:  Progressing 01/13/2022 0455 by Fransisco Hertz, RN Outcome: Progressing   Problem: Elimination: Goal: Will not experience complications related to bowel motility 01/13/2022 0455 by Fransisco Hertz, RN Outcome: Progressing 01/13/2022 0455 by Fransisco Hertz, RN Outcome: Progressing Goal: Will not experience complications related to urinary retention 01/13/2022 0455 by Fransisco Hertz, RN Outcome: Progressing 01/13/2022 0455 by Fransisco Hertz, RN Outcome: Progressing   Problem: Pain Managment: Goal: General experience of comfort will improve 01/13/2022 0455 by Fransisco Hertz, RN Outcome: Progressing 01/13/2022 0455 by Fransisco Hertz, RN Outcome: Progressing   Problem: Safety: Goal: Ability to remain free from injury will improve 01/13/2022 0455 by Fransisco Hertz, RN Outcome: Progressing 01/13/2022 0455 by Fransisco Hertz, RN Outcome: Progressing   Problem: Skin Integrity: Goal: Risk for impaired skin integrity will decrease 01/13/2022 0455 by Fransisco Hertz, RN Outcome: Progressing 01/13/2022 0455 by Fransisco Hertz, RN Outcome: Progressing   Problem: Activity: Goal: Ability to tolerate increased activity will improve 01/13/2022 0455 by Fransisco Hertz, RN Outcome: Not Progressing 01/13/2022 0455 by Fransisco Hertz, RN Outcome: Progressing   Problem: Education: Goal: Knowledge of General Education information will improve Description: Including pain rating scale, medication(s)/side effects and non-pharmacologic comfort measures 01/13/2022 0455 by Fransisco Hertz, RN Outcome: Not Progressing 01/13/2022 0455 by Fransisco Hertz, RN Outcome: Progressing   Problem: Health Behavior/Discharge Planning: Goal: Ability to manage health-related needs will improve 01/13/2022 0455 by Fransisco Hertz, RN Outcome: Not Progressing 01/13/2022 0455 by Fransisco Hertz, RN Outcome: Progressing

## 2022-01-13 NOTE — TOC Progression Note (Addendum)
Transition of Care [facility]) - Progression Note    Patient Details  Name: Myiesha Edgar MRN: [id number] Date of Birth: 04/30/1926  Transition of Care [facility]) CM/SW Contact  Leeroy Cha, RN Phone Number: 01/13/2022, 12:40 PM  Clinical Narrative:    [facility] to see if a bed will be available tomorrow. [facility] at [place] can come on [phone]  Expected Discharge Plan: [place] Barriers to Discharge: Continued Medical Work up  Expected Discharge Plan and Services   Discharge Planning Services: CM Consult Post Acute Care Choice: [place] arrangements for the past 2 months: Single Family Home                                       Social Determinants of Health (SDOH) Interventions SDOH Screenings   Food Insecurity: No Food Insecurity (01/06/2022)  Housing: Low Risk  (01/06/2022)  Transportation Needs: No Transportation Needs (01/06/2022)  Utilities: Not At Risk (01/06/2022)  Tobacco Use: Medium Risk (01/06/2022)    Readmission Risk Interventions   Row Labels 01/12/2022    1:09 PM  Readmission Risk Prevention Plan   Section Header. No data exists in this row.   Transportation Screening   Complete  PCP or Specialist Appt within 3-5 Days   Complete  HRI or [place]   Complete  Social Work Consult for [place] Planning/Counseling   Complete  Palliative Care Screening   Complete  Medication Review Press photographer)   Complete

## 2022-01-14 DIAGNOSIS — A419 Sepsis, unspecified organism: Secondary | ICD-10-CM | POA: Diagnosis not present

## 2022-01-14 DIAGNOSIS — R451 Restlessness and agitation: Secondary | ICD-10-CM | POA: Diagnosis not present

## 2022-01-14 DIAGNOSIS — J9601 Acute respiratory failure with hypoxia: Secondary | ICD-10-CM | POA: Diagnosis not present

## 2022-01-14 DIAGNOSIS — Z7189 Other specified counseling: Secondary | ICD-10-CM | POA: Diagnosis not present

## 2022-01-14 DIAGNOSIS — E876 Hypokalemia: Secondary | ICD-10-CM

## 2022-01-14 MED ORDER — GERHARDT'S BUTT CREAM: 1.0000 | TOPICAL_CREAM | Freq: Every day | CUTANEOUS | Status: AC

## 2022-01-14 MED ORDER — FUROSEMIDE 20 MG PO TABS: 20.0000 mg | ORAL_TABLET | Freq: Every day | ORAL | Status: DC

## 2022-01-14 MED ORDER — GUAIFENESIN-DM 100-10 MG/5ML PO SYRP: 5.0000 mL | ORAL_SOLUTION | ORAL | 0 refills | Status: AC | PRN

## 2022-01-14 MED ORDER — ONDANSETRON HCL 4 MG PO TABS: 4.0000 mg | ORAL_TABLET | Freq: Four times a day (QID) | ORAL | 0 refills | Status: DC | PRN

## 2022-01-14 NOTE — Progress Notes (Signed)
Patient to be discharged today to [facility]. Report called to the facility. Daughter aware of pending discharge. Discharge AVS with packet at time of discharge

## 2022-01-14 NOTE — Discharge Summary (Signed)
Physician Discharge Summary  [place] [id number]:[id number] DOB: 24-Dec-1926 DOA: 01/05/2022  PCP: Courtney Paris, NP  Admit date: 01/05/2022 Discharge date: 01/14/2022  Admitted From: Home  Discharge disposition: Skilled nursing facility.  Recommendations for Outpatient Follow-Up:   Follow up with your primary care provider at the skilled nursing facility in 3 to 5 days. Check CBC, BMP, magnesium in the next visit Continue to wean oxygen as able at the skilled nursing facility.  Discharge Diagnosis:   Principal Problem:   Severe sepsis (HCC) Active Problems:   Palliative care encounter   Acute hypoxic respiratory failure (HCC)   Goals of care, counseling/discussion   DNR (do not resuscitate)   Shortness of breath   High risk medication use   Agitation   Sepsis with acute hypoxic respiratory failure without septic shock (HCC)   Need for emotional support   Counseling and coordination of care   Medication management   Pneumonia of right lung due to infectious organism   RSV infection   Hypokalemia   Discharge Condition: Stable and  improved  Diet recommendation: Soft diet.  Wound care: None.  Code status: DNR   History of Present Illness:   89 years old female with past medical history of dementia, anxiety, depression, GERD, hyperlipidemia, hypertension, anemia of chronic disease, sinus bradycardia, recent pneumonia who was admitted  end of December presented to the hospital with generalized weakness, fever.  Patient lives by herself at home.  EMS was called in and was noted to be hypoxic with pulse ox of 83% on room air with a fever of 102.5 F.  She was very weak to get out of the bed and had been exposed to family members with RSV.  In the ED, patient was febrile, tachycardic and was requiring 5 L of oxygen by nasal cannula.  Chest x-ray showed new patchy infiltrate in the right middle and lower lobe suggestive of  multifocal pneumonia with small right pleural  effusion.  She also appeared to be confused.  Code sepsis was initiated in the ED and patient received vancomycin and cefepime fluid bolus.  She was initially on 6 L of high flow nasal cannula.  Patient was then admitted hospital for further evaluation and treatment.      Hospital Course:   Following conditions were addressed during hospitalization as listed below,  Acute hypoxemic respiratory failure and severe sepsis in the setting of multifocal pneumonia and RSV infection: Was initially on cefepime and vancomycin.  Has completed course of antibiotic.  MRSA PCR was negative.  Blood cultures negative  Legionella and strep urinary antigen  negative. Palliative care was consulted during hospitalization.  Still on 4 L of oxygen by nasal cannula.  Will continue to wean as able.  Continue to monitor intake and output charting, continue Lasix 20 mg daily on discharge.   Demand ischemia  Had elevated troponins.  EKG was nonacute.  2D echocardiogram obtained showed EF 65 to 70%, G2 DD, no RWMA significant calcification at the noncoronary cusp, mitral valve normal.  No mention of further chest pain.   Mild hypokalemia.  Improved after replacement.  Latest potassium of 3.6.  Acute on chronic diastolic CHF with grade 2 diastolic dysfunction. -BNP elevated 443.  Patient received IV Lasix during hospitalization will be changed to oral Lasix on discharge.  Continue lisinopril.  Mild Dementia: Continue Namenda/Aricept.   Anxiety/depression: On Celexa.   GERD: Continue omeprazole.   Hypothyroidism: continue synthyroid   Hypertension: On lisinopril and amlodipine.  Anemia of chronic disease: Hemoglobin remained stable.  Latest hemoglobin of 9.7.   Generalized weakness: PT has recommended skilled nursing facility placement.   CKD stage IIIb: At baseline.  Latest creatinine of 0.9.  Disposition.  At this time, patient is stable for disposition to skilled nursing facility.  Communicated with the  patient's daughter on 01/13/2022 regarding disposition plan.  Medical Consultants:   Palliative care  Procedures:    None Subjective:   Today, patient was seen and examined at bedside.  No interval complaints reported.  No nausea vomiting fever chills or rigors.  No chest pain.  Denies overt dyspnea.  Has remained stable overnight.  Discharge Exam:   Vitals:   01/13/22 2029 01/14/22 0455  BP: (!) 142/55 (!) 127/56  Pulse: 66 64  Resp: 18 17  Temp: 98.4 F (36.9 C) 98.3 F (36.8 C)  SpO2: 97% 95%   Vitals:   01/13/22 1605 01/13/22 2029 01/14/22 0455 01/14/22 0500  BP: (!) 117/47 (!) 142/55 (!) 127/56   Pulse: 61 66 64   Resp: 18 18 17    Temp: 98.4 F (36.9 C) 98.4 F (36.9 C) 98.3 F (36.8 C)   TempSrc: Oral Oral Oral   SpO2: 98% 97% 95%   Weight:    61.1 kg  Height:        General: Alert awake, not in obvious distress, hard of hearing, thinly built, on 4 L of oxygen by nasal cannula, HENT: pupils equally reacting to light,  No scleral pallor or icterus noted. Oral mucosa is moist.  Chest: Diminished breath sounds bilaterally. CVS: S1 &S2 heard. No murmur.  Regular rate and rhythm. Abdomen: Soft, nontender, nondistended.  Bowel sounds are heard.   Extremities: No cyanosis, clubbing or edema.  Peripheral pulses are palpable. Psych: Alert, awake and oriented, normal mood CNS:  No cranial nerve deficits.  Generalized weakness noted. Skin: Warm and dry.  No rashes noted.  The results of significant diagnostics from this hospitalization (including imaging, microbiology, ancillary and laboratory) are listed below for reference.     Diagnostic Studies:   ECHOCARDIOGRAM COMPLETE  Result Date: 01/06/2022    ECHOCARDIOGRAM REPORT   Patient Name:   JAIONA KENTNER Biela Date of Exam: 01/06/2022 Medical Rec #:  [id number]                 Height:       71.0 in Accession #:    [id number]                Weight:       132.3 lb Date of Birth:  1926/06/02                 BSA:           1.769 m Patient Age:    89 years                  BP:           137/45 mmHg Patient Gender: F                         HR:           61 bpm. Exam Location:  Inpatient Procedure: 2D Echo, Cardiac Doppler, Color Doppler and Intracardiac            Opacification Agent Indications:    Elevated Troponin  History:        Patient has prior history of Echocardiogram  examinations, most                 recent 05/29/2019. Arrythmias:Sinus bradycardia due to                 beta-blocker; Risk Factors:Dyslipidemia and Hypertension.                 Chronic anemia. Acute hypoxic respiratory failure, pneumonia.                 Chronic kidney disease.  Sonographer:    Darlina Sicilian RDCS Referring Phys: [id number] [place] IMPRESSIONS  1. Left ventricular ejection fraction, by estimation, is 65 to 70%. The left ventricle has normal function. The left ventricle has no regional wall motion abnormalities. There is mild left ventricular hypertrophy. Left ventricular diastolic parameters are consistent with Grade II diastolic dysfunction (pseudonormalization). Elevated left atrial pressure.  2. Right ventricular systolic function is normal. The right ventricular size is normal. There is mildly elevated pulmonary artery systolic pressure. The estimated right ventricular systolic pressure is 14.7 mmHg.  3. Left atrial size was mildly dilated.  4. Right atrial size was mildly dilated.  5. The mitral valve is normal in structure. Mild mitral valve regurgitation.  6. Significant calcification at the non-coronary cusp. . The aortic valve was not well visualized. Aortic valve regurgitation is trivial. Aortic valve sclerosis/calcification is present, without any evidence of aortic stenosis. FINDINGS  Left Ventricle: Left ventricular ejection fraction, by estimation, is 65 to 70%. The left ventricle has normal function. The left ventricle has no regional wall motion abnormalities. Definity contrast agent was given IV to delineate the left  ventricular  endocardial borders. The left ventricular internal cavity size was normal in size. There is mild left ventricular hypertrophy. Left ventricular diastolic parameters are consistent with Grade II diastolic dysfunction (pseudonormalization). Elevated left atrial pressure. Right Ventricle: The right ventricular size is normal. Right vetricular wall thickness was not well visualized. Right ventricular systolic function is normal. There is mildly elevated pulmonary artery systolic pressure. The tricuspid regurgitant velocity  is 2.91 m/s, and with an assumed right atrial pressure of 3 mmHg, the estimated right ventricular systolic pressure is 82.9 mmHg. Left Atrium: Left atrial size was mildly dilated. Right Atrium: Right atrial size was mildly dilated. Pericardium: Trivial pericardial effusion is present. Mitral Valve: The mitral valve is normal in structure. Mild mitral valve regurgitation. Tricuspid Valve: The tricuspid valve is normal in structure. Tricuspid valve regurgitation is mild . No evidence of tricuspid stenosis. Aortic Valve: Significant calcification at the non-coronary cusp. The aortic valve was not well visualized. Aortic valve regurgitation is trivial. Aortic valve sclerosis/calcification is present, without any evidence of aortic stenosis. Aortic valve mean  gradient measures 7.0 mmHg. Aortic valve peak gradient measures 13.5 mmHg. Aortic valve area, by VTI measures 2.26 cm. Pulmonic Valve: The pulmonic valve was grossly normal. Pulmonic valve regurgitation is not visualized. No evidence of pulmonic stenosis. Aorta: The aortic root and ascending aorta are structurally normal, with no evidence of dilitation. IAS/Shunts: The interatrial septum was not well visualized.  LEFT VENTRICLE PLAX 2D LVIDd:         4.30 cm     Diastology LVIDs:         2.70 cm     LV e' medial:    7.29 cm/s LV PW:         1.00 cm     LV E/e' medial:  18.7 LV IVS:  1.20 cm     LV e' lateral:   8.49 cm/s LVOT  diam:     2.10 cm     LV E/e' lateral: 16.0 LV SV:         99 LV SV Index:   56 LVOT Area:     3.46 cm  LV Volumes (MOD) LV vol d, MOD A2C: 93.6 ml LV vol d, MOD A4C: 87.1 ml LV vol s, MOD A2C: 27.9 ml LV vol s, MOD A4C: 27.4 ml LV SV MOD A2C:     65.7 ml LV SV MOD A4C:     87.1 ml LV SV MOD BP:      63.7 ml RIGHT VENTRICLE RV S prime:     17.40 cm/s TAPSE (M-mode): 2.6 cm LEFT ATRIUM             Index        RIGHT ATRIUM           Index LA diam:        4.00 cm 2.26 cm/m   RA Area:     21.80 cm LA Vol (A2C):   58.0 ml 32.79 ml/m  RA Volume:   62.10 ml  35.11 ml/m LA Vol (A4C):   62.3 ml 35.22 ml/m LA Biplane Vol: 62.3 ml 35.22 ml/m  AORTIC VALVE AV Area (Vmax):    2.75 cm AV Area (Vmean):   2.70 cm AV Area (VTI):     2.26 cm AV Vmax:           184.00 cm/s AV Vmean:          122.000 cm/s AV VTI:            0.438 m AV Peak Grad:      13.5 mmHg AV Mean Grad:      7.0 mmHg LVOT Vmax:         146.00 cm/s LVOT Vmean:        95.000 cm/s LVOT VTI:          0.286 m LVOT/AV VTI ratio: 0.65  AORTA Ao Root diam: 2.80 cm Ao Asc diam:  3.10 cm MITRAL VALVE                TRICUSPID VALVE MV Area (PHT): 3.37 cm     TR Peak grad:   33.9 mmHg MV Decel Time: 225 msec     TR Vmax:        291.00 cm/s MV E velocity: 136.00 cm/s MV A velocity: 74.40 cm/s   SHUNTS MV E/A ratio:  1.83         Systemic VTI:  0.29 m                             Systemic Diam: 2.10 cm Oswaldo Milian MD Electronically signed by Oswaldo Milian MD Signature Date/Time: 01/06/2022/10:35:43 AM    Final    DG Chest Port 1 View  Result Date: 01/06/2022 CLINICAL DATA:  Pneumonia. EXAM: PORTABLE CHEST 1 VIEW COMPARISON:  January 05, 2022. FINDINGS: Stable cardiomegaly. Stable pleural calcifications are noted. No acute abnormality seen in the left lung. Increased right upper and lower lobe opacities are noted concerning for pneumonia. Bony thorax is unremarkable. IMPRESSION: Increased right lung opacities are noted concerning for pneumonia.  Electronically Signed   By: Marijo Conception M.D.   On: 01/06/2022 09:21   DG Chest 2 View  Result Date: 01/05/2022 CLINICAL DATA:  Shortness of breath, fever EXAM: CHEST - 2 VIEW COMPARISON:  Previous studies including the examination done on 12/24/2021 FINDINGS: Transverse diameter of heart is increased. There is interval appearance of new patchy infiltrates in right mid and right lower lung fields. There is interval appearance of small right pleural effusion. Infiltrate in right upper lung fields has not changed. Extensive pleural calcifications and pleural thickening are noted in both apices. Left lung shows no new focal infiltrates. IMPRESSION: New patchy infiltrates are seen in right mid and right lower lung fields suggesting multifocal pneumonia. Small right pleural effusion. Infiltrate in right upper lobe has not changed significantly. Electronically Signed   By: Elmer Picker M.D.   On: 01/05/2022 13:43     Labs:   Basic Metabolic Panel: Recent Labs  Lab 01/08/22 0547 01/09/22 0539 01/10/22 0553 01/12/22 0520 01/13/22 1042  NA 139 137 139 140 136  K 3.9 3.5 3.3* 3.2* 3.6  CL 100 102 103 103 100  CO2 29 28 28 27 28   GLUCOSE 112* 118* 96 99 119*  BUN 28* 30* 32* 35* 35*  CREATININE 0.97 0.81 0.87 0.95 0.97  CALCIUM 8.7* 8.5* 8.5* 8.6* 8.8*  MG  --  2.0  --  2.0  --    GFR Estimated Creatinine Clearance: 35 mL/min (by C-G formula based on SCr of 0.97 mg/dL). Liver Function Tests: No results for input(s): "AST", "ALT", "ALKPHOS", "BILITOT", "PROT", "ALBUMIN" in the last 168 hours. No results for input(s): "LIPASE", "AMYLASE" in the last 168 hours. No results for input(s): "AMMONIA" in the last 168 hours. Coagulation profile No results for input(s): "INR", "PROTIME" in the last 168 hours.  CBC: Recent Labs  Lab 01/08/22 0547 01/09/22 0539 01/10/22 0553 01/12/22 0520  WBC 5.7 7.3 5.8 6.7  HGB 9.6* 9.2* 8.8* 9.7*  HCT 30.7* 28.5* 27.5* 30.4*  MCV 91.4 89.3 89.9  90.2  PLT 254 295 306 421*   Cardiac Enzymes: No results for input(s): "CKTOTAL", "CKMB", "CKMBINDEX", "TROPONINI" in the last 168 hours. BNP: Invalid input(s): "POCBNP" CBG: No results for input(s): "GLUCAP" in the last 168 hours. D-Dimer No results for input(s): "DDIMER" in the last 72 hours. Hgb A1c No results for input(s): "HGBA1C" in the last 72 hours. Lipid Profile No results for input(s): "CHOL", "HDL", "LDLCALC", "TRIG", "CHOLHDL", "LDLDIRECT" in the last 72 hours. Thyroid function studies No results for input(s): "TSH", "T4TOTAL", "T3FREE", "THYROIDAB" in the last 72 hours.  Invalid input(s): "FREET3" Anemia work up No results for input(s): "VITAMINB12", "FOLATE", "FERRITIN", "TIBC", "IRON", "RETICCTPCT" in the last 72 hours. Microbiology Recent Results (from the past 240 hour(s))  Resp panel by RT-PCR (RSV, Flu A&B, Covid) Anterior Nasal Swab     Status: Abnormal   Collection Time: 01/05/22  1:01 PM   Specimen: Anterior Nasal Swab  Result Value Ref Range Status   SARS Coronavirus 2 by RT PCR NEGATIVE NEGATIVE Final    Comment: (NOTE) SARS-CoV-2 target nucleic acids are NOT DETECTED.  The SARS-CoV-2 RNA is generally detectable in upper respiratory specimens during the acute phase of infection. The lowest concentration of SARS-CoV-2 viral copies this assay can detect is 138 copies/mL. A negative result does not preclude SARS-Cov-2 infection and should not be used as the sole basis for treatment or other patient management decisions. A negative result may occur with  improper specimen collection/handling, submission of specimen other than nasopharyngeal swab, presence of viral mutation(s) within the areas targeted by this assay, and inadequate number of viral copies(<138 copies/mL). A negative  result must be combined with clinical observations, patient history, and epidemiological information. The expected result is Negative.  Fact Sheet for Patients:   EntrepreneurPulse.com.au  Fact Sheet for Healthcare Providers:  IncredibleEmployment.be  This test is no t yet approved or cleared by the [place] FDA and  has been authorized for detection and/or diagnosis of SARS-CoV-2 by FDA under an Emergency Use Authorization (EUA). This EUA will remain  in effect (meaning this test can be used) for the duration of the COVID-19 declaration under Section 564(b)(1) of the Act, 21 U.S.C.section 360bbb-3(b)(1), unless the authorization is terminated  or revoked sooner.       Influenza A by PCR NEGATIVE NEGATIVE Final   Influenza B by PCR NEGATIVE NEGATIVE Final    Comment: (NOTE) The Xpert Xpress SARS-CoV-2/FLU/RSV plus assay is intended as an aid in the diagnosis of influenza from Nasopharyngeal swab specimens and should not be used as a sole basis for treatment. Nasal washings and aspirates are unacceptable for Xpert Xpress SARS-CoV-2/FLU/RSV testing.  Fact Sheet for Patients: EntrepreneurPulse.com.au  Fact Sheet for Healthcare Providers: IncredibleEmployment.be  This test is not yet approved or cleared by the [place] FDA and has been authorized for detection and/or diagnosis of SARS-CoV-2 by FDA under an Emergency Use Authorization (EUA). This EUA will remain in effect (meaning this test can be used) for the duration of the COVID-19 declaration under Section 564(b)(1) of the Act, 21 U.S.C. section 360bbb-3(b)(1), unless the authorization is terminated or revoked.     Resp Syncytial Virus by PCR POSITIVE (A) NEGATIVE Final    Comment: (NOTE) Fact Sheet for Patients: EntrepreneurPulse.com.au  Fact Sheet for Healthcare Providers: IncredibleEmployment.be  This test is not yet approved or cleared by the [place] FDA and has been authorized for detection and/or diagnosis of SARS-CoV-2 by FDA under an Emergency Use  Authorization (EUA). This EUA will remain in effect (meaning this test can be used) for the duration of the COVID-19 declaration under Section 564(b)(1) of the Act, 21 U.S.C. section 360bbb-3(b)(1), unless the authorization is terminated or revoked.  Performed at [facility], [place] [address] [place], [place] [address]   Blood Culture (routine x 2)     Status: None   Collection Time: 01/05/22  1:41 PM   Specimen: BLOOD  Result Value Ref Range Status   Specimen Description   Final    BLOOD SITE NOT SPECIFIED Performed at [facility] [address], [place], [place] [address]    Special Requests   Final    BOTTLES DRAWN AEROBIC AND ANAEROBIC Blood Culture results may not be optimal due to an inadequate volume of blood received in culture bottles Performed at [place] [address], [place], [place] [address]    Culture   Final    NO GROWTH 5 DAYS Performed at [facility], [place] [address], [place], [place] [address]    Report Status 01/10/2022 FINAL  Final  Blood Culture (routine x 2)     Status: None   Collection Time: 01/05/22  3:29 PM   Specimen: BLOOD  Result Value Ref Range Status   Specimen Description   Final    BLOOD LEFT ANTECUBITAL Performed at [facility] [address] [place], [place] [address]    Special Requests   Final    BOTTLES DRAWN AEROBIC AND ANAEROBIC Blood Culture adequate volume Performed at [facility] [address], [place], [place] [address]  Culture   Final    NO GROWTH 5 DAYS Performed at [facility], [address] [place], [place] [address]    Report Status 01/10/2022 FINAL  Final  Urine Culture     Status: Abnormal   Collection Time: 01/05/22  6:27 PM   Specimen: In/Out Cath Urine  Result Value Ref Range Status   Specimen Description   Final    IN/OUT CATH URINE Performed at [facility], [address]  [place], [place] [address]    Special Requests   Final    NONE Performed at [facility], [address], [place], [place] [address]    Culture MULTIPLE SPECIES PRESENT, SUGGEST RECOLLECTION (A)  Final   Report Status 01/06/2022 FINAL  Final  MRSA Next Gen by PCR, Nasal     Status: None   Collection Time: 01/05/22 11:49 PM  Result Value Ref Range Status   MRSA by PCR Next Gen NOT DETECTED NOT DETECTED Final    Comment: (NOTE) The GeneXpert MRSA Assay (FDA approved for NASAL specimens only), is one component of a comprehensive MRSA colonization surveillance program. It is not intended to diagnose MRSA infection nor to guide or monitor treatment for MRSA infections. Test performance is not FDA approved in patients less than 74 years old. Performed at [facility], [address] [place], [place] [address]      Discharge Instructions:   Discharge Instructions     Call MD for:  persistant nausea and vomiting   Complete by: As directed    Call MD for:  severe uncontrolled pain   Complete by: As directed    Call MD for:  temperature >100.4   Complete by: As directed    Diet general   Complete by: As directed    Soft diet.   Discharge instructions   Complete by: As directed    Follow-up with your primary care provider at the skilled nursing facility in 3 to 5 days.  Check blood work at that time.  Continue oxygen on discharge.  Seek medical attention for worsening symptoms.   Increase activity slowly   Complete by: As directed       Allergies as of 01/14/2022       Reactions   Codeine Other (See Comments)   Altered mental status        Medication List     STOP taking these medications    aspirin EC 81 MG tablet   azithromycin 250 MG tablet Commonly known as: ZITHROMAX       TAKE these medications    albuterol 108 (90 Base) MCG/ACT inhaler Commonly known as: VENTOLIN HFA Inhale 2 puffs into the lungs every 6 (six) hours as  needed for wheezing or shortness of breath.   amLODipine 10 MG tablet Commonly known as: NORVASC Take 10 mg by mouth daily.   citalopram 20 MG tablet Commonly known as: CELEXA Take 20 mg by mouth daily.   donepezil 23 MG Tabs tablet Commonly known as: ARICEPT Take 23 mg by mouth daily.   ferrous gluconate 324 MG tablet Commonly known as: FERGON Take 324 mg by mouth daily with breakfast.   furosemide 20 MG tablet Commonly known as: Lasix Take 1 tablet (20 mg total) by mouth daily.   gabapentin 300 MG capsule Commonly known as: NEURONTIN Take 300 mg by mouth 3 (three) times daily as needed (for neuropathic pain).   Gerhardt's butt cream Crea Apply 1 Application  topically daily.   guaiFENesin-dextromethorphan 100-10 MG/5ML syrup Commonly known as: ROBITUSSIN DM Take 5 mLs by mouth every 4 (four) hours as needed for up to 5 days for cough (chest congestion).   levothyroxine 75 MCG tablet Commonly known as: SYNTHROID Take 75 mcg by mouth daily before breakfast.   lisinopril 10 MG tablet Commonly known as: ZESTRIL Take 10 mg by mouth daily.   memantine 5 MG tablet Commonly known as: NAMENDA TAKE 1 TABLET BY MOUTH EVERYDAY AT BEDTIME What changed: See the new instructions.   omeprazole 20 MG capsule Commonly known as: PRILOSEC TAKE 1 CAPSULE DAILY What changed: when to take this   ondansetron 4 MG tablet Commonly known as: ZOFRAN Take 1 tablet (4 mg total) by mouth every 6 (six) hours as needed for nausea or vomiting.   oxybutynin 5 MG 24 hr tablet Commonly known as: DITROPAN-XL Take 5 mg by mouth daily.   oxybutynin 10 MG 24 hr tablet Commonly known as: DITROPAN-XL Take 10 mg by mouth daily.   polyvinyl alcohol 1.4 % ophthalmic solution Commonly known as: LIQUIFILM TEARS Place 1 drop into both eyes as needed for dry eyes.   prednisoLONE acetate 1 % ophthalmic suspension Commonly known as: PRED FORTE Place 1 drop into the left eye 3 (three) times  daily. What changed: when to take this   PROBIOTIC PO Take 1 capsule by mouth daily after breakfast.   Prolensa 0.07 % Soln Generic drug: Bromfenac Sodium Place 1 drop into the left eye 3 (three) times daily. What changed: when to take this   Vitamin D3 125 MCG (5000 UT) Tabs Take 5,000 Units by mouth daily.          Time coordinating discharge: 39 minutes  Signed:  Camay Pedigo  [facility] 01/14/2022, 8:45 AM

## 2022-01-14 NOTE — Plan of Care (Signed)
  Problem: Activity: ?Goal: Ability to tolerate increased activity will improve ?Outcome: Adequate for Discharge ?  ?Problem: Clinical Measurements: ?Goal: Ability to maintain a body temperature in the normal range will improve ?Outcome: Adequate for Discharge ?  ?Problem: Respiratory: ?Goal: Ability to maintain adequate ventilation will improve ?Outcome: Adequate for Discharge ?Goal: Ability to maintain a clear airway will improve ?Outcome: Adequate for Discharge ?  ?Problem: Education: ?Goal: Knowledge of General Education information will improve ?Description: Including pain rating scale, medication(s)/side effects and non-pharmacologic comfort measures ?Outcome: Adequate for Discharge ?  ?Problem: Health Behavior/Discharge Planning: ?Goal: Ability to manage health-related needs will improve ?Outcome: Adequate for Discharge ?  ?Problem: Clinical Measurements: ?Goal: Ability to maintain clinical measurements within normal limits will improve ?Outcome: Adequate for Discharge ?Goal: Will remain free from infection ?Outcome: Adequate for Discharge ?Goal: Diagnostic test results will improve ?Outcome: Adequate for Discharge ?Goal: Respiratory complications will improve ?Outcome: Adequate for Discharge ?Goal: Cardiovascular complication will be avoided ?Outcome: Adequate for Discharge ?  ?Problem: Activity: ?Goal: Risk for activity intolerance will decrease ?Outcome: Adequate for Discharge ?  ?Problem: Nutrition: ?Goal: Adequate nutrition will be maintained ?Outcome: Adequate for Discharge ?  ?Problem: Coping: ?Goal: Level of anxiety will decrease ?Outcome: Adequate for Discharge ?  ?Problem: Elimination: ?Goal: Will not experience complications related to bowel motility ?Outcome: Adequate for Discharge ?Goal: Will not experience complications related to urinary retention ?Outcome: Adequate for Discharge ?  ?Problem: Pain Managment: ?Goal: General experience of comfort will improve ?Outcome: Adequate for Discharge ?   ?Problem: Safety: ?Goal: Ability to remain free from injury will improve ?Outcome: Adequate for Discharge ?  ?Problem: Skin Integrity: ?Goal: Risk for impaired skin integrity will decrease ?Outcome: Adequate for Discharge ?  ?

## 2022-01-14 NOTE — TOC Transition Note (Signed)
Transition of Care [facility]) - CM/SW Discharge Note   Patient Details  Name: Tracey Rosario MRN: [id number] Date of Birth: 05-07-1926  Transition of Care [facility]) CM/SW Contact:  Leeroy Cha, RN Phone Number: 01/14/2022, 11:03 AM   Clinical Narrative:    Patient dcd to go to [facility].  Ptar called at 1100 for transport. Number to call for report is [phone].  Transport packet placed at the unit clerk desk in the nursing station.    Final next level of care: Skilled Nursing Facility Barriers to Discharge: Barriers Resolved   Patient Goals and CMS Choice CMS Medicare.gov Compare Post Acute Care list provided to:: Patient Represenative (must comment) ([name](dtr)) Choice offered to / list presented to : Adult Children  Discharge Placement                         Discharge Plan and Services Additional resources added to the After Visit Summary for     Discharge Planning Services: CM Consult Post Acute Care Choice: [place]                               Social Determinants of Health (SDOH) Interventions SDOH Screenings   Food Insecurity: No Food Insecurity (01/06/2022)  Housing: Low Risk  (01/06/2022)  Transportation Needs: No Transportation Needs (01/06/2022)  Utilities: Not At Risk (01/06/2022)  Tobacco Use: Medium Risk (01/06/2022)     Readmission Risk Interventions   Row Labels 01/12/2022    1:09 PM  Readmission Risk Prevention Plan   Section Header. No data exists in this row.   Transportation Screening   Complete  PCP or Specialist Appt within 3-5 Days   Complete  HRI or [place]   Complete  Social Work Consult for [place] Planning/Counseling   Complete  Palliative Care Screening   Complete  Medication Review Press photographer)   Complete

## 2022-01-14 NOTE — TOC Progression Note (Addendum)
Transition of Care [facility]) - Progression Note    Patient Details  Name: Tracey Rosario MRN: [id number] Date of Birth: 03-09-1926  Transition of Care [facility]) CM/SW Contact  Leeroy Cha, RN Phone Number: 01/14/2022, 9:21 AM  Clinical Narrative:    [facility]-message left to call back with room number and departure time. [facility]-will text room number.  Expected Discharge Plan: [place] Barriers to Discharge: Continued Medical Work up  Expected Discharge Plan and Services   Discharge Planning Services: CM Consult Post Acute Care Choice: [place] arrangements for the past 2 months: Single Family Home Expected Discharge Date: 01/14/22                                     Social Determinants of Health (SDOH) Interventions SDOH Screenings   Food Insecurity: No Food Insecurity (01/06/2022)  Housing: Low Risk  (01/06/2022)  Transportation Needs: No Transportation Needs (01/06/2022)  Utilities: Not At Risk (01/06/2022)  Tobacco Use: Medium Risk (01/06/2022)    Readmission Risk Interventions   Row Labels 01/12/2022    1:09 PM  Readmission Risk Prevention Plan   Section Header. No data exists in this row.   Transportation Screening   Complete  PCP or Specialist Appt within 3-5 Days   Complete  HRI or [place]   Complete  Social Work Consult for [place] Planning/Counseling   Complete  Palliative Care Screening   Complete  Medication Review Press photographer)   Complete

## 2022-01-16 ENCOUNTER — Other Ambulatory Visit (INDEPENDENT_AMBULATORY_CARE_PROVIDER_SITE_OTHER): Payer: Self-pay | Admitting: Ophthalmology

## 2022-01-17 ENCOUNTER — Emergency Department (HOSPITAL_COMMUNITY): Payer: Medicare Other

## 2022-01-17 ENCOUNTER — Emergency Department (HOSPITAL_COMMUNITY)
Admission: EM | Admit: 2022-01-17 | Discharge: 2022-01-17 | Disposition: A | Discharge status: Home / Self Care | Payer: Medicare Other | Attending: Emergency Medicine | Admitting: Emergency Medicine

## 2022-01-17 DIAGNOSIS — W19XXXA Unspecified fall, initial encounter: Secondary | ICD-10-CM | POA: Diagnosis not present

## 2022-01-17 DIAGNOSIS — I129 Hypertensive chronic kidney disease with stage 1 through stage 4 chronic kidney disease, or unspecified chronic kidney disease: Secondary | ICD-10-CM | POA: Diagnosis not present

## 2022-01-17 DIAGNOSIS — S0003XA Contusion of scalp, initial encounter: Secondary | ICD-10-CM | POA: Insufficient documentation

## 2022-01-17 DIAGNOSIS — N189 Chronic kidney disease, unspecified: Secondary | ICD-10-CM | POA: Insufficient documentation

## 2022-01-17 DIAGNOSIS — S0990XA Unspecified injury of head, initial encounter: Secondary | ICD-10-CM | POA: Diagnosis present

## 2022-01-17 MED ORDER — FENTANYL CITRATE PF 50 MCG/ML IJ SOSY
25.0000 ug | PREFILLED_SYRINGE | Freq: Once | INTRAMUSCULAR | Status: AC
  Administered 2022-01-17: 25 ug via INTRAVENOUS
  Filled 2022-01-17: qty 1

## 2022-01-17 MED ORDER — ACETAMINOPHEN 325 MG PO TABS
650.0000 mg | ORAL_TABLET | Freq: Once | ORAL | Status: AC
  Administered 2022-01-17: 650 mg via ORAL
  Filled 2022-01-17: qty 2

## 2022-01-17 NOTE — ED Notes (Signed)
Ptar called 

## 2022-01-17 NOTE — Discharge Instructions (Signed)
Your imaging studies did not show any new injuries.  Take Tylenol as needed for headache and/or soreness.  Return to the emergency department for any new or worsening symptoms of concern.

## 2022-01-17 NOTE — ED Provider Notes (Signed)
[place] DEPT Provider Note   CSN: [id number] Arrival date & time: 01/17/22  1748     History  No chief complaint on file.   Tracey Rosario is a 89 y.o. female.  HPI Patient presents after a fall.  Medical history includes anemia, HLD, HTN, macular degeneration, CKD, cognitive decline, GERD.  She had a recent hospitalization for sepsis secondary to multifocal pneumonia.  She was discharged to skilled nursing facility 3 days ago.  Today, patient had a witnessed fall at her nursing facility.  This was reported as her try to get up from her wheelchair and falling forward.  She has balance issues at baseline.  She is reportedly not on anticoagulation.  Patient endorses pain throughout her neck.    Home Medications Prior to Admission medications   Medication Sig Start Date End Date Taking? Authorizing Provider  albuterol (VENTOLIN HFA) 108 (90 Base) MCG/ACT inhaler Inhale 2 puffs into the lungs every 6 (six) hours as needed for wheezing or shortness of breath. 12/25/21   Thurnell Lose, MD  amLODipine (NORVASC) 10 MG tablet Take 10 mg by mouth daily. 12/05/19   [provider]  Bromfenac Sodium (PROLENSA) 0.07 % SOLN Place 1 drop into the left eye in the morning and at bedtime. 01/17/22   Bernarda Caffey, MD  Cholecalciferol (VITAMIN D3) 125 MCG (5000 UT) TABS Take 5,000 Units by mouth daily.     [provider]  citalopram (CELEXA) 20 MG tablet Take 20 mg by mouth daily. 04/29/19   [provider]  donepezil (ARICEPT) 23 MG TABS tablet Take 23 mg by mouth daily. 11/22/21   [provider]  ferrous gluconate (FERGON) 324 MG tablet Take 324 mg by mouth daily with breakfast. 04/29/19   [provider]  furosemide (LASIX) 20 MG tablet Take 1 tablet (20 mg total) by mouth daily. 01/14/22 01/14/23  Pokhrel, Corrie Mckusick, MD  gabapentin (NEURONTIN) 300 MG capsule Take 300 mg by mouth 3 (three) times daily as needed (for  neuropathic pain). 06/05/20   [provider]  guaiFENesin-dextromethorphan (ROBITUSSIN DM) 100-10 MG/5ML syrup Take 5 mLs by mouth every 4 (four) hours as needed for up to 5 days for cough (chest congestion). 01/14/22 01/19/22  Pokhrel, Corrie Mckusick, MD  levothyroxine (SYNTHROID) 75 MCG tablet Take 75 mcg by mouth daily before breakfast. 03/04/20   [provider]  lisinopril (ZESTRIL) 10 MG tablet Take 10 mg by mouth daily. 12/02/20   [provider]  memantine (NAMENDA) 5 MG tablet TAKE 1 TABLET BY MOUTH EVERYDAY AT BEDTIME Patient taking differently: Take 5 mg by mouth at bedtime. 12/10/21   Rondel Jumbo, PA-C  Nystatin (GERHARDT'S BUTT CREAM) CREA Apply 1 Application topically daily. 01/14/22   Pokhrel, Corrie Mckusick, MD  omeprazole (PRILOSEC) 20 MG capsule TAKE 1 CAPSULE DAILY Patient taking differently: Take 20 mg by mouth daily before breakfast. 10/20/17   Lucretia Kern, DO  ondansetron (ZOFRAN) 4 MG tablet Take 1 tablet (4 mg total) by mouth every 6 (six) hours as needed for nausea or vomiting. 01/14/22   Pokhrel, Corrie Mckusick, MD  oxybutynin (DITROPAN-XL) 10 MG 24 hr tablet Take 10 mg by mouth daily. 10/25/21   [provider]  oxybutynin (DITROPAN-XL) 5 MG 24 hr tablet Take 5 mg by mouth daily. Patient not taking: Reported on 01/05/2022 12/14/20   [provider]  polyvinyl alcohol (LIQUIFILM TEARS) 1.4 % ophthalmic solution Place 1 drop into both eyes as needed for dry eyes. Patient  not taking: Reported on 01/05/2022    [provider]  prednisoLONE acetate (PRED FORTE) 1 % ophthalmic suspension Place 1 drop into the left eye 3 (three) times daily. Patient taking differently: Place 1 drop into the left eye in the morning and at bedtime. 06/23/21   Bernarda Caffey, MD  Probiotic Product (PROBIOTIC PO) Take 1 capsule by mouth daily after breakfast.    [provider]      Allergies    Codeine    Review of Systems   Review of Systems  Unable to perform  ROS: Dementia  Musculoskeletal:  Positive for neck pain.    Physical Exam Updated Vital Signs BP (!) 147/54   Pulse 65   Temp 98 F (36.7 C)   Resp 17   SpO2 96%  Physical Exam Vitals and nursing note reviewed.  Constitutional:      General: She is not in acute distress.    Appearance: She is well-developed. She is not ill-appearing, toxic-appearing or diaphoretic.  HENT:     Head: Normocephalic.     Comments: Small hematoma to left frontal scalp.  Skin is intact.    Right Ear: External ear normal.     Left Ear: External ear normal.     Nose: Nose normal.     Mouth/Throat:     Mouth: Mucous membranes are moist.  Eyes:     Extraocular Movements: Extraocular movements intact.     Conjunctiva/sclera: Conjunctivae normal.  Neck:     Comments: Cervical collar in place Cardiovascular:     Rate and Rhythm: Normal rate and regular rhythm.     Heart sounds: No murmur heard. Pulmonary:     Effort: Pulmonary effort is normal. No respiratory distress.     Breath sounds: No wheezing or rales.  Chest:     Chest wall: No tenderness.  Abdominal:     General: There is no distension.     Palpations: Abdomen is soft.     Tenderness: There is no abdominal tenderness.  Musculoskeletal:        General: No swelling, tenderness or deformity. Normal range of motion.  Skin:    General: Skin is warm and dry.     Coloration: Skin is not jaundiced or pale.  Neurological:     General: No focal deficit present.     Mental Status: She is alert. Mental status is at baseline.     Cranial Nerves: No cranial nerve deficit.     Sensory: No sensory deficit.     Motor: No weakness.  Psychiatric:        Mood and Affect: Mood is anxious.        Speech: Speech normal.        Behavior: Behavior is cooperative.     ED Results / Procedures / Treatments   Labs (all labs ordered are listed, but only abnormal results are displayed) Labs Reviewed - No data to display  EKG None  Radiology CT HEAD  WO CONTRAST  Result Date: 01/17/2022 CLINICAL DATA:  Fall, trauma EXAM: CT HEAD WITHOUT CONTRAST CT CERVICAL SPINE WITHOUT CONTRAST TECHNIQUE: Multidetector CT imaging of the head and cervical spine was performed following the standard protocol without intravenous contrast. Multiplanar CT image reconstructions of the cervical spine were also generated. RADIATION DOSE REDUCTION: This exam was performed according to the departmental dose-optimization program which includes automated exposure control, adjustment of the mA and/or kV according to patient size and/or use of iterative reconstruction technique. COMPARISON:  04/24/2019 FINDINGS: CT HEAD FINDINGS Examination of the posterior fossa is limited by motion artifact. Brain: No evidence of acute infarction, hemorrhage, hydrocephalus, extra-axial collection or mass lesion/mass effect. Periventricular and deep white matter hypodensity. Vascular: No hyperdense vessel or unexpected calcification. Skull: Normal. Negative for fracture or focal lesion. Sinuses/Orbits: No acute finding. Other: None. CT CERVICAL SPINE FINDINGS Alignment: Normal. Skull base and vertebrae: No acute fracture. No primary bone lesion or focal pathologic process. Soft tissues and spinal canal: No prevertebral fluid or swelling. No visible canal hematoma. Disc levels: Focally severe disc space height loss and osteophytosis of C6-C7 with otherwise mild disc degenerative change. Upper chest: Calcified biapical pleuroparenchymal scarring. Other: None. IMPRESSION: 1. Examination of the posterior fossa is limited by motion artifact. 2. Within this limitation, no acute intracranial pathology. Small-vessel white matter disease. 3. No fracture or static subluxation of the cervical spine. 4. Focally severe disc space height loss and osteophytosis of C6-C7 with otherwise mild disc degenerative change. Electronically Signed   By: Jearld Lesch M.D.   On: 01/17/2022 19:18   CT CERVICAL SPINE WO  CONTRAST  Result Date: 01/17/2022 CLINICAL DATA:  Fall, trauma EXAM: CT HEAD WITHOUT CONTRAST CT CERVICAL SPINE WITHOUT CONTRAST TECHNIQUE: Multidetector CT imaging of the head and cervical spine was performed following the standard protocol without intravenous contrast. Multiplanar CT image reconstructions of the cervical spine were also generated. RADIATION DOSE REDUCTION: This exam was performed according to the departmental dose-optimization program which includes automated exposure control, adjustment of the mA and/or kV according to patient size and/or use of iterative reconstruction technique. COMPARISON:  04/24/2019 FINDINGS: CT HEAD FINDINGS Examination of the posterior fossa is limited by motion artifact. Brain: No evidence of acute infarction, hemorrhage, hydrocephalus, extra-axial collection or mass lesion/mass effect. Periventricular and deep white matter hypodensity. Vascular: No hyperdense vessel or unexpected calcification. Skull: Normal. Negative for fracture or focal lesion. Sinuses/Orbits: No acute finding. Other: None. CT CERVICAL SPINE FINDINGS Alignment: Normal. Skull base and vertebrae: No acute fracture. No primary bone lesion or focal pathologic process. Soft tissues and spinal canal: No prevertebral fluid or swelling. No visible canal hematoma. Disc levels: Focally severe disc space height loss and osteophytosis of C6-C7 with otherwise mild disc degenerative change. Upper chest: Calcified biapical pleuroparenchymal scarring. Other: None. IMPRESSION: 1. Examination of the posterior fossa is limited by motion artifact. 2. Within this limitation, no acute intracranial pathology. Small-vessel white matter disease. 3. No fracture or static subluxation of the cervical spine. 4. Focally severe disc space height loss and osteophytosis of C6-C7 with otherwise mild disc degenerative change. Electronically Signed   By: Jearld Lesch M.D.   On: 01/17/2022 19:18   DG Pelvis Portable  Result Date:  01/17/2022 CLINICAL DATA:  Recent fall with pelvic pain, initial encounter EXAM: PORTABLE PELVIS 1 VIEWS COMPARISON:  10/07/2013 FINDINGS: Postsurgical changes are noted in the proximal right femur. Old healed pubic rami fractures are again noted bilaterally. Pelvic ring is otherwise intact. No soft tissue abnormality is seen. IMPRESSION: No acute abnormality noted. Electronically Signed   By: Alcide Clever M.D.   On: 01/17/2022 19:07   DG Chest Port 1 View  Result Date: 01/17/2022 CLINICAL DATA:  Witnessed fall with chest pain, initial encounter EXAM: PORTABLE CHEST 1 VIEW COMPARISON:  01/07/2022 FINDINGS: Cardiac shadow is stable. Aortic calcifications are again seen. Apical pleural plaquing is noted bilaterally stable from the prior exam. Previously seen right upper lobe infiltrate has improved significantly when compared with the prior exam although  some residual density remains. No acute bony abnormality is seen. IMPRESSION: Improved aeration in the right upper lobe although some residual infiltrate remains. No other focal abnormality is noted. Electronically Signed   By: Inez Catalina M.D.   On: 01/17/2022 19:04    Procedures Procedures    Medications Ordered in ED Medications  acetaminophen (TYLENOL) tablet 650 mg (has no administration in time range)  fentaNYL (SUBLIMAZE) injection 25 mcg (25 mcg Intravenous Given 01/17/22 1844)    ED Course/ Medical Decision Making/ A&P                             Medical Decision Making Amount and/or Complexity of Data Reviewed Radiology: ordered.  Risk Prescription drug management.   This patient presents to the ED for concern of fall, this involves an extensive number of treatment options, and is a complaint that carries with it a high risk of complications and morbidity.  The differential diagnosis includes acute injuries   Co morbidities that complicate the patient evaluation  anemia, HLD, HTN, macular degeneration, CKD, dementia,  GERD   Additional history obtained:  Additional history obtained from patient's family External records from outside source obtained and reviewed including EMR  Imaging Studies ordered:  I ordered imaging studies including x-ray of chest and pelvis, CT of head and cervical spine I independently visualized and interpreted imaging which showed no acute findings I agree with the radiologist interpretation   Cardiac Monitoring: / EKG:  The patient was maintained on a cardiac monitor.  I personally viewed and interpreted the cardiac monitored which showed an underlying rhythm of: Sinus rhythm  Problem List / ED Course / Critical interventions / Medication management  Patient presents after a witnessed fall at skilled nursing facility.  This was reported as a loss of balance that she was standing up from her wheelchair.  Patient reportedly fell forward.  She does have a small hematoma to left frontal scalp.  She endorses pain throughout her neck.  There is concern of a neck hyperextension injury.  Currently, she has intact strength and sensation throughout her extremities.  She has no areas of deformity or tenderness on exam.  Patient was kept in cervical collar.  Fentanyl was ordered for analgesia.  Imaging studies were ordered.  CT of head and cervical spine were negative for acute findings.  Cervical collar was removed.  With removal of cervical collar, neck discomfort resolved.  Patient's chest x-ray shows improved aeration from recent studies.  On reassessment, patient is resting comfortably.  She denies any discomfort at this time.  Family is at bedside.  They confirm that she is at her mental baseline.  Patient was discharged in stable condition. I ordered medication including fentanyl and Tylenol for analgesia Reevaluation of the patient after these medicines showed that the patient improved I have reviewed the patients home medicines and have made adjustments as needed   Social  Determinants of Health:  Has dementia and recent hospitalization with deconditioning.  Currently resides at rehab facility         Final Clinical Impression(s) / ED Diagnoses Final diagnoses:  Fall, initial encounter    Rx / DC Orders ED Discharge Orders     None         Godfrey Pick, MD 01/17/22 2032

## 2022-01-17 NOTE — ED Triage Notes (Signed)
Pt BIB EMS from [facility] for a witnessed fall. Pt has unsteady gait, she was attempting to stand up on her own and fell forward, hitting her head on foot of wheelchair. Now has c- collar.  3L of oxygen normally. No new onset of confusion. Denies being on blood thinners  BP 138/58 HR 64 O2 96 3L

## 2022-02-10 NOTE — Progress Notes (Shared)
[facility] Note  02/16/2022     CHIEF COMPLAINT Patient presents for No chief complaint on file.  HISTORY OF PRESENT ILLNESS: Tracey Rosario is a 89 y.o. female who presents to the clinic today for:     Referring physician: Simona Huh, NP [place],  [place] [id number]  HISTORICAL INFORMATION:   Selected notes from the MEDICAL RECORD NUMBER Referred by Dr. Quentin Ore for retinal eval   CURRENT MEDICATIONS: Current Outpatient Medications (Ophthalmic Drugs)  Medication Sig   Bromfenac Sodium (PROLENSA) 0.07 % SOLN Place 1 drop into the left eye in the morning and at bedtime.   polyvinyl alcohol (LIQUIFILM TEARS) 1.4 % ophthalmic solution Place 1 drop into both eyes as needed for dry eyes. (Patient not taking: Reported on 01/05/2022)   prednisoLONE acetate (PRED FORTE) 1 % ophthalmic suspension Place 1 drop into the left eye 3 (three) times daily. (Patient taking differently: Place 1 drop into the left eye in the morning and at bedtime.)   No current facility-administered medications for this visit. (Ophthalmic Drugs)   Current Outpatient Medications (Other)  Medication Sig   albuterol (VENTOLIN HFA) 108 (90 Base) MCG/ACT inhaler Inhale 2 puffs into the lungs every 6 (six) hours as needed for wheezing or shortness of breath.   amLODipine (NORVASC) 10 MG tablet Take 10 mg by mouth daily.   Cholecalciferol (VITAMIN D3) 125 MCG (5000 UT) TABS Take 5,000 Units by mouth daily.    citalopram (CELEXA) 20 MG tablet Take 20 mg by mouth daily.   donepezil (ARICEPT) 23 MG TABS tablet Take 23 mg by mouth daily.   ferrous gluconate (FERGON) 324 MG tablet Take 324 mg by mouth daily with breakfast.   furosemide (LASIX) 20 MG tablet Take 1 tablet (20 mg total) by mouth daily.   gabapentin (NEURONTIN) 300 MG capsule Take 300 mg by mouth 3 (three) times daily as needed (for neuropathic pain).   levothyroxine (SYNTHROID) 75 MCG tablet Take 75  mcg by mouth daily before breakfast.   lisinopril (ZESTRIL) 10 MG tablet Take 10 mg by mouth daily.   memantine (NAMENDA) 5 MG tablet TAKE 1 TABLET BY MOUTH EVERYDAY AT BEDTIME (Patient taking differently: Take 5 mg by mouth at bedtime.)   Nystatin (GERHARDT'S BUTT CREAM) CREA Apply 1 Application topically daily.   omeprazole (PRILOSEC) 20 MG capsule TAKE 1 CAPSULE DAILY (Patient taking differently: Take 20 mg by mouth daily before breakfast.)   ondansetron (ZOFRAN) 4 MG tablet Take 1 tablet (4 mg total) by mouth every 6 (six) hours as needed for nausea or vomiting.   oxybutynin (DITROPAN-XL) 10 MG 24 hr tablet Take 10 mg by mouth daily.   oxybutynin (DITROPAN-XL) 5 MG 24 hr tablet Take 5 mg by mouth daily. (Patient not taking: Reported on 01/05/2022)   Probiotic Product (PROBIOTIC PO) Take 1 capsule by mouth daily after breakfast.   No current facility-administered medications for this visit. (Other)   REVIEW OF SYSTEMS:    ALLERGIES Allergies  Allergen Reactions   Codeine Other (See Comments)    Altered mental status   PAST MEDICAL HISTORY Past Medical History:  Diagnosis Date   Acid reflux    Anemia    Arthropathy    Bradycardia    beta blocker reduced in 2021 due to this   CKD (chronic kidney disease), stage III ([place])    Cognitive decline    referred to neurologist   Depression 10/07/2013   daughter  didn't know anything about this   Dizziness    Frequent PVCs    GERD (gastroesophageal reflux disease) 05/04/2013   Hip fx ([place])    r hip, bilat pubis rami   Hyperlipemia    Hypertension    Hypertensive retinopathy    OU   Hypothyroidism    Insomnia    Macular degeneration    Exu OS   Multilevel degenerative disc disease    Palpitations 05/04/2013   saw cardiologist in 2015, he deleted A. Fib from the record as per note not documentation to support   Premature atrial contractions    PSVT (paroxysmal supraventricular tachycardia)    a. short runs by monitor in 2021.    Short-term memory loss    Vitamin D deficiency    Past Surgical History:  Procedure Laterality Date   ABDOMINAL HYSTERECTOMY  1971   BLADDER SURGERY  2010   Broken Pelvis  2015   C-EYE SURGERY PROCEDURE Bilateral    [facility]   CATARACT EXTRACTION Bilateral    Done in [place] Bilateral    Cat Sx   HIP PINNING,CANNULATED Right 10/07/2013   Procedure: CANNULATED screws right hip;  Surgeon: Mcarthur Rossetti, MD;  Location: [facility];  Service: Orthopedics;  Laterality: Right;   FAMILY HISTORY Family History  Problem Relation Age of Onset   Prostate cancer Father    Heart attack Sister    Cancer Brother    Cancer Sister    Heart attack Sister    Cancer Sister    Stroke Daughter    Dementia Neg Hx    Alzheimer's disease Neg Hx    SOCIAL HISTORY Social History   Tobacco Use   Smoking status: Former    Types: Cigarettes    Quit date: 08/02/1970    Years since quitting: 51.5   Smokeless tobacco: Never   Tobacco comments:    "smoked very little"  Vaping Use   Vaping Use: Never used  Substance Use Topics   Alcohol use: Never    Alcohol/week: 0.0 standard drinks of alcohol   Drug use: Never       OPHTHALMIC EXAM:  Not recorded     IMAGING AND PROCEDURES  Imaging and Procedures for @TODAY @          ASSESSMENT/PLAN:  No diagnosis found.  1-3. 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 Daralene Milch, Ocular Oncologist, on 9.22.2020 -- no tumor on exam or b-scan  - s/p IVA OS #1 (08.05.20) for subretinal hemorrhage,  peripheral CNV  - s/p IVA OD #1 (11.18.22), #2 (12.16.22), #3 (01.13.23), #4 (02.10.23), #5 (03.24.23)  - s/p IVE OS #1 (09.02.20) - sample, #2 (10.12.20), #3 (11.11.20), #4 (12.09.20), #5 (1.20.21), #6 (02.17.21), #7 (03.19.21), #8 (04.30.21), #9 (06.16.21), #10 (08.11.21), #11 (12.01.21), #12 (01.26.22), #13 (03.30.22), #14 (06.01.22), #15 (08.10.22), #16 (09.23.22), #17 (11.18.22), #18 (02.10.23), #19 (03.24.23), #20 (05.08.23), #21 (06.21.23), #22 (08.16.23), #23 (10.25.23), #24 (02.14.24)  - s/p IVE OD #1 (05.08.23), #2 (06.21.23), #3 (08.16.23)  - today BCVA 20/25 OD - stable; OS 20/40 -- stable  -  exam shows improved peripheral SRH OD and stable improvement in central edema OS  - OCT shows OD: stable ERM with blunted foveal contour, bullous schisis with PEDs within IT quad caught on widefield -- persistent; OS: Mild interval improvement in central cystic changes/edema -- just trace cystic changes remain, persistent ERM with PRF at 8 weeks  - continue Prolensa and PF BID OS for possible CME component  - recommend IVE OS #25 today,02.14.24 w/ f/u at 8 wks  - will hold OD today  - pt in agreement  - RBA of procedure discussed, questions answered   - see procedure note  - IVE OU informed consent obtained and signed, 05.08.23  - [employer] Benefits investigation initiated 09.02.2020 -- approved for 2023  - f/u 8 weeks, sooner prn, OCT/DFE/OPTOS COLORS OU, possible injections  4. Epiretinal membrane, OU.  - mild ERM OU  - asymptomatic, no metamorphopsia  - no indication for surgery at this time  - monitor  5. Retinoschisis OD -- stable  - bullous schisis cavity, IT quadrant, from 0700-0830, -- ?less posterior extension  - no associated RT/RD but now with [place] and exudates -- improving   - discussed findings and prognosis  - monitor  6,7. Hypertensive retinopathy OU  - discussed importance of tight BP control  - monitor  8. Pseudophakia OU  - s/p CE/IOL OU  - beautiful surgery, doing  well  - monitor  Ophthalmic Meds Ordered this visit:  No orders of the defined types were placed in this encounter.    No follow-ups on file.  There are no Patient Instructions on file for this visit.   Explained the diagnoses, plan, and follow up with the patient and they expressed understanding.  Patient expressed understanding of the importance of proper follow up care.  This document serves as a record of services personally performed by Gardiner Sleeper, MD, PhD. It was created on their behalf by Orvan Falconer, an ophthalmic technician. The creation of this record is the provider's dictation and/or activities during the visit.    Electronically signed by: Orvan Falconer, OA, 02/10/22  2:20 PM    Gardiner Sleeper, M.D., Ph.D. Diseases & Surgery of the Retina and Vitreous [facility]   Abbreviations: M myopia (nearsighted); A astigmatism; H hyperopia (farsighted); P presbyopia; Mrx spectacle prescription;  CTL contact lenses; OD right eye; OS left eye; OU both eyes  XT exotropia; ET esotropia; PEK punctate epithelial keratitis; PEE punctate epithelial erosions; DES dry eye syndrome; MGD meibomian gland dysfunction; ATs artificial tears; PFAT's preservative free artificial tears; [place] nuclear sclerotic cataract; PSC posterior subcapsular cataract; ERM epi-retinal membrane; PVD posterior vitreous detachment; RD retinal detachment; DM diabetes mellitus; DR diabetic retinopathy; NPDR non-proliferative diabetic retinopathy; PDR proliferative diabetic retinopathy; CSME clinically significant macular edema; DME diabetic macular edema; dbh dot blot hemorrhages; CWS cotton wool spot; POAG primary open angle glaucoma; C/D cup-to-disc ratio; HVF humphrey visual field; GVF goldmann visual field; OCT optical coherence tomography; IOP intraocular pressure; BRVO Branch retinal vein occlusion; CRVO central retinal vein occlusion; CRAO central retinal artery occlusion; BRAO  branch retinal artery occlusion; RT retinal tear; SB scleral buckle; PPV pars plana vitrectomy; VH Vitreous hemorrhage; PRP panretinal laser photocoagulation; IVK intravitreal kenalog; VMT vitreomacular traction; MH Macular hole;  NVD neovascularization of the disc; NVE neovascularization elsewhere; AREDS age related eye disease study; ARMD age related macular degeneration; POAG primary open angle glaucoma; EBMD epithelial/anterior basement membrane dystrophy; ACIOL anterior chamber intraocular lens; IOL intraocular lens;  PCIOL posterior chamber intraocular lens; Phaco/IOL phacoemulsification with intraocular lens placement; [place] photorefractive keratectomy; LASIK laser assisted in situ keratomileusis; HTN hypertension; DM diabetes mellitus; COPD chronic obstructive pulmonary disease

## 2022-02-16 ENCOUNTER — Encounter (INDEPENDENT_AMBULATORY_CARE_PROVIDER_SITE_OTHER): Payer: Self-pay

## 2022-02-16 ENCOUNTER — Encounter (INDEPENDENT_AMBULATORY_CARE_PROVIDER_SITE_OTHER): Payer: Medicare Other | Admitting: Ophthalmology

## 2022-02-16 DIAGNOSIS — I10 Essential (primary) hypertension: Secondary | ICD-10-CM

## 2022-02-16 DIAGNOSIS — H353231 Exudative age-related macular degeneration, bilateral, with active choroidal neovascularization: Secondary | ICD-10-CM

## 2022-02-16 DIAGNOSIS — H3563 Retinal hemorrhage, bilateral: Secondary | ICD-10-CM

## 2022-02-16 DIAGNOSIS — H35373 Puckering of macula, bilateral: Secondary | ICD-10-CM

## 2022-02-16 DIAGNOSIS — H33101 Unspecified retinoschisis, right eye: Secondary | ICD-10-CM

## 2022-02-16 DIAGNOSIS — Z961 Presence of intraocular lens: Secondary | ICD-10-CM

## 2022-02-16 DIAGNOSIS — H3322 Serous retinal detachment, left eye: Secondary | ICD-10-CM

## 2022-02-16 DIAGNOSIS — H35033 Hypertensive retinopathy, bilateral: Secondary | ICD-10-CM

## 2022-02-17 ENCOUNTER — Ambulatory Visit: Payer: Medicare Other | Admitting: Physician Assistant

## 2022-02-21 NOTE — Progress Notes (Signed)
[facility] Note  03/01/2022     CHIEF COMPLAINT Patient presents for Retina Follow Up  HISTORY OF PRESENT ILLNESS: Tracey Rosario is a 89 y.o. female who presents to the clinic today for:   HPI     Retina Follow Up   Patient presents with  Wet AMD.  In both eyes.  This started 8 weeks ago.  I, the attending physician,  performed the HPI with the patient and updated documentation appropriately.        Comments   Patient here for 8 weeks for retina follow up for exu ARMD OU OU. Patient states vision seeing enough. No eye pain. Was in hospital first of year for RSVP and pneumonia. Was in rehab till yesterday.      Last edited by Bernarda Caffey, MD on 03/01/2022 11:16 PM.    Pt is delayed to follow up from 8 weeks to 9+ weeks because she had pneumonia and RSV after Christmas and then was in SNF, pt just got out yesterday  Referring physician: Simona Huh, NP [place],  [place] [address]  HISTORICAL INFORMATION:   Selected notes from the MEDICAL RECORD NUMBER Referred by Dr. Quentin Ore for retinal eval   CURRENT MEDICATIONS: Current Outpatient Medications (Ophthalmic Drugs)  Medication Sig   Bromfenac Sodium (PROLENSA) 0.07 % SOLN Place 1 drop into the left eye in the morning and at bedtime.   polyvinyl alcohol (LIQUIFILM TEARS) 1.4 % ophthalmic solution Place 1 drop into both eyes as needed for dry eyes. (Patient not taking: Reported on 01/05/2022)   prednisoLONE acetate (PRED FORTE) 1 % ophthalmic suspension Place 1 drop into the left eye 3 (three) times daily. (Patient not taking: Reported on 03/01/2022)   No current facility-administered medications for this visit. (Ophthalmic Drugs)   Current Outpatient Medications (Other)  Medication Sig   albuterol (VENTOLIN HFA) 108 (90 Base) MCG/ACT inhaler Inhale 2 puffs into the lungs every 6 (six) hours as needed for wheezing or shortness of breath.   amLODipine (NORVASC) 10  MG tablet Take 10 mg by mouth daily.   Cholecalciferol (VITAMIN D3) 125 MCG (5000 UT) TABS Take 5,000 Units by mouth daily.    citalopram (CELEXA) 20 MG tablet Take 20 mg by mouth daily.   donepezil (ARICEPT) 23 MG TABS tablet Take 23 mg by mouth daily.   ferrous gluconate (FERGON) 324 MG tablet Take 324 mg by mouth daily with breakfast.   furosemide (LASIX) 20 MG tablet Take 1 tablet (20 mg total) by mouth daily.   gabapentin (NEURONTIN) 300 MG capsule Take 300 mg by mouth 3 (three) times daily as needed (for neuropathic pain).   levothyroxine (SYNTHROID) 75 MCG tablet Take 75 mcg by mouth daily before breakfast.   lisinopril (ZESTRIL) 10 MG tablet Take 10 mg by mouth daily.   memantine (NAMENDA) 5 MG tablet TAKE 1 TABLET BY MOUTH EVERYDAY AT BEDTIME (Patient taking differently: Take 5 mg by mouth at bedtime.)   Nystatin (GERHARDT'S BUTT CREAM) CREA Apply 1 Application topically daily.   omeprazole (PRILOSEC) 20 MG capsule TAKE 1 CAPSULE DAILY (Patient taking differently: Take 20 mg by mouth daily before breakfast.)   ondansetron (ZOFRAN) 4 MG tablet Take 1 tablet (4 mg total) by mouth every 6 (six) hours as needed for nausea or vomiting.   oxybutynin (DITROPAN-XL) 10 MG 24 hr tablet Take 10 mg by mouth daily.   Probiotic Product (PROBIOTIC PO) Take 1 capsule by mouth  daily after breakfast.   oxybutynin (DITROPAN-XL) 5 MG 24 hr tablet Take 5 mg by mouth daily. (Patient not taking: Reported on 01/05/2022)   No current facility-administered medications for this visit. (Other)   REVIEW OF SYSTEMS: ROS   Positive for: Gastrointestinal, Neurological, Genitourinary, Musculoskeletal, Cardiovascular, Eyes Negative for: Constitutional, Skin, HENT, Endocrine, Respiratory, Psychiatric, Allergic/Imm, Heme/Lymph Last edited by Theodore Demark, COA on 03/01/2022  1:54 PM.     ALLERGIES Allergies  Allergen Reactions   Codeine Other (See Comments)    Altered mental status   PAST MEDICAL HISTORY Past  Medical History:  Diagnosis Date   Acid reflux    Anemia    Arthropathy    Bradycardia    beta blocker reduced in 2021 due to this   CKD (chronic kidney disease), stage III ([place])    Cognitive decline    referred to neurologist   Depression 10/07/2013   daughter didn't know anything about this   Dizziness    Frequent PVCs    GERD (gastroesophageal reflux disease) 05/04/2013   Hip fx ([place])    r hip, bilat pubis rami   Hyperlipemia    Hypertension    Hypertensive retinopathy    OU   Hypothyroidism    Insomnia    Macular degeneration    Exu OS   Multilevel degenerative disc disease    Palpitations 05/04/2013   saw cardiologist in 2015, he deleted A. Fib from the record as per note not documentation to support   Premature atrial contractions    PSVT (paroxysmal supraventricular tachycardia)    a. short runs by monitor in 2021.   Short-term memory loss    Vitamin D deficiency    Past Surgical History:  Procedure Laterality Date   ABDOMINAL HYSTERECTOMY  1971   BLADDER SURGERY  2010   Broken Pelvis  2015   C-EYE SURGERY PROCEDURE Bilateral    [facility]al[facility]   CATARACT EXTRACTION Bilateral    Done in [place] Bilateral    Cat Sx   HIP PINNING,CANNULATED Right 10/07/2013   Procedure: CANNULATED screws right hip;  Surgeon: Mcarthur Rossetti, MD;  Location: [facility];  Service: Orthopedics;  Laterality: Right;   FAMILY HISTORY Family History  Problem Relation Age of Onset   Prostate cancer Father    Heart attack Sister    Cancer Brother    Cancer Sister    Heart attack Sister    Cancer Sister    Stroke Daughter    Dementia Neg Hx    Alzheimer's disease Neg Hx    SOCIAL HISTORY Social History   Tobacco Use   Smoking status: Former    Types: Cigarettes    Quit date: 08/02/1970    Years since quitting: 51.6   Smokeless tobacco: Never   Tobacco comments:    "smoked very little"  Vaping Use   Vaping Use: Never used  Substance Use Topics   Alcohol use:  Never    Alcohol/week: 0.0 standard drinks of alcohol   Drug use: Never       OPHTHALMIC EXAM:  Base Eye Exam     Visual Acuity (Snellen - Linear)       Right Left   Dist [place] 20/30 20/40 -1   Dist ph [place] NI NI         Tonometry (Tonopen, 1:51 PM)       Right Left   Pressure 08 05         Pupils  Dark Light Shape React APD   Right 2 1 Round Slow None   Left 2 1 Round Slow None         Visual Fields (Counting fingers)       Left Right    Full Full         Extraocular Movement       Right Left    Full, Ortho Full, Ortho         Neuro/Psych     Oriented x3: Yes   Mood/Affect: Normal         Dilation     Both eyes: 1.0% Mydriacyl, 2.5% Phenylephrine @ 1:51 PM           Slit Lamp and Fundus Exam     Slit Lamp Exam       Right Left   Lids/Lashes Dermatochalasis - upper lid Dermatochalasis - upper lid   Conjunctiva/Sclera White and quiet White and quiet   Cornea Arcus, 1-2+ Punctate epithelial erosions, mild EBMD and corneal haze Arcus, 2-3+ Punctate epithelial erosions, EBMD, irregular epi   Anterior Chamber Deep and quiet Deep and quiet   Iris Round and dilated Round and moderately dilated to 59m   Lens PC IOL in good position with open PC, anterior capsular phimosis PC IOL in good position with open PC   Anterior Vitreous Vitreous syneresis, Posterior vitreous detachment Vitreous syneresis; PVD; vitreous condensations         Fundus Exam       Right Left   Disc Mild temporal Pallor, Peripapillary atrophy, Sharp rim, Compact mild tilt, 2+ temporal Pallor, Peripapillary atrophy, Compact, Sharp rim   C/D Ratio 0.1 0.0   Macula Flat, Blunted foveal reflex, mild Epiretinal membrane, drusen, RPE mottling and clumping, No heme, interval re-development of cystic changes centrally Flat, Blunted foveal reflex, RPE mottling and clumping, Epiretinal membrane, trace cystic changes, No heme   Vessels attenuated, Tortuous attenuated, mild  tortuosity   Periphery IT schisis cavity from 0700-0830; otherwise attached; stable improvement in [facility] exudation under schisis cavities ([facility]not red and pigmenting in), exudation resolved, focal pigmented CR scar at 0630; no red heme temporal sub-retinal hemorrhage improved - now white with surrounding pigment clumping;  No SRF -- completely resolved, focal, punctate IRH - improved, shallow schisis IT quad, No heme            IMAGING AND PROCEDURES  Imaging and Procedures for '@TODAY'$ @  OCT, Retina - OU - Both Eyes       Right Eye Quality was good. Central Foveal Thickness: 398. Progression has worsened. Findings include no SRF, abnormal foveal contour, retinal drusen , epiretinal membrane, intraretinal fluid, macular pucker, pigment epithelial detachment (Mild interval increase in cystic changes centrally, stable ERM with blunted foveal contour, bullous schisis with PEDs within IT quad caught on widefield -- persistent).   Left Eye Quality was good. Central Foveal Thickness: 323. Progression has been stable. Findings include no SRF, abnormal foveal contour, retinal drusen , epiretinal membrane, intraretinal fluid, preretinal fibrosis (Persistent central cystic changes/edema -- just trace cystic changes remain, persistent ERM with PRF).   Notes *Images captured and stored on drive  Diagnosis / Impression:  OD: Mild interval increase in cystic changes centrally, stable ERM with blunted foveal contour, bullous schisis with PEDs within IT quad caught on widefield -- persistent OS: Persistent central cystic changes/edema -- just trace cystic changes remain, persistent ERM with PRF  Clinical management:  See below  Abbreviations: NFP - Normal foveal  profile. CME - cystoid macular edema. PED - pigment epithelial detachment. IRF - intraretinal fluid. SRF - subretinal fluid. EZ - ellipsoid zone. ERM - epiretinal membrane. ORA - outer retinal atrophy. ORT - outer retinal tubulation. SRHM -  subretinal hyper-reflective material      Intravitreal Injection, Pharmacologic Agent - OD - Right Eye       Time Out 03/01/2022. 3:17 PM. Confirmed correct patient, procedure, site, and patient consented.   Anesthesia Topical anesthesia was used. Anesthetic medications included Lidocaine 2%, Proparacaine 0.5%.   Procedure Preparation included 5% betadine to ocular surface, eyelid speculum. A (32g) needle was used.   Injection: 2 mg aflibercept 2 MG/0.05ML   Route: Intravitreal, Site: Right Eye   NDC: A3590391, Lot: [id number], Expiration date: 03/03/2023, Waste: 0 mL   Post-op Post injection exam found visual acuity of at least counting fingers. The patient tolerated the procedure well. There were no complications. The patient received written and verbal post procedure care education. Post injection medications were not given.      Intravitreal Injection, Pharmacologic Agent - OS - Left Eye       Time Out 03/01/2022. 3:18 PM. Confirmed correct patient, procedure, site, and patient consented.   Anesthesia Topical anesthesia was used. Anesthetic medications included Lidocaine 2%, Proparacaine 0.5%.   Procedure Preparation included 5% betadine to ocular surface, eyelid speculum. A (32g) needle was used.   Injection: 2 mg aflibercept 2 MG/0.05ML   Route: Intravitreal, Site: Left Eye   NDC: A3590391, Lot: [id number], Expiration date: 03/03/2023, Waste: 0 mL   Post-op Post injection exam found visual acuity of at least counting fingers. The patient tolerated the procedure well. There were no complications. The patient received written and verbal post procedure care education. Post injection medications were not given.            ASSESSMENT/PLAN:    ICD-10-CM   1. Exudative age-related macular degeneration of both eyes with active choroidal neovascularization (HCC)  H35.3231 OCT, Retina - OU - Both Eyes    Intravitreal Injection, Pharmacologic Agent - OD - Right  Eye    Intravitreal Injection, Pharmacologic Agent - OS - Left Eye    aflibercept (EYLEA) SOLN 2 mg    aflibercept (EYLEA) SOLN 2 mg    2. Subretinal hemorrhage of both eyes  H35.63     3. Serous retinal detachment of left eye  H33.22     4. 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 Essential hypertension  I10     7. Hypertensive retinopathy of both eyes  H35.033     8. Pseudophakia of both eyes  Z96.1       1-3. 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 Daralene Milch, Ocular Oncologist, on 9.22.2020 -- no tumor on exam or b-scan  - s/p IVA OS #1 (08.05.20) for subretinal hemorrhage, peripheral CNV  - s/p IVA OD #1 (11.18.22), #2 (12.16.22), #3 (01.13.23), #4 (02.10.23), #5 (03.24.23)  - s/p IVE OS #1 (09.02.20) - sample, #2 (10.12.20), #3 (  11.11.20), #4 (12.09.20), #5 (1.20.21), #6 (02.17.21), #7 (03.19.21), #8 (04.30.21), #9 (06.16.21), #10 (08.11.21), #11 (12.01.21), #12 (01.26.22), #13 (03.30.22), #14 (06.01.22), #15 (08.10.22), #16 (09.23.22), #17 (11.18.22), #18 (02.10.23), #19 (03.24.23), #20 (05.08.23), #21 (06.21.23), #22 (08.16.23), #23 (10.25.23), #24 (12.20.23)  - s/p IVE OD #1 (05.08.23), #2 (06.21.23), #3 (08.16.23)  - today BCVA 20/25 OD - stable; OS 20/40 -- stable  - exam shows improved peripheral SRH OD and stable improvement in central edema OS  - OCT shows OD: Mild interval increase in cystic changes centrally, stable ERM with blunted foveal contour, bullous schisis with PEDs within IT  quad caught on widefield -- persistent at 6 mos since last IVE OD; OCT OS shows: Persistent central cystic changes/edema -- just trace cystic changes remain, persistent ERM with PRF at 9+ weeks  - continue Prolensa and PF BID OS for possible CME component  - recommend IVE OD #4 and OS #25 today,02.27.24 w/ f/u at 8 wks  - pt in agreement  - RBA of procedure discussed, questions answered   - see procedure note  - IVE OU informed consent obtained and signed, 05.08.23  - [employer] Benefits investigation initiated 09.02.2020 -- approved for 2023  - f/u 8 weeks, sooner prn, OCT/DFE/OPTOS COLORS OU, possible injections  4. Epiretinal membrane, OU.  - mild ERM OU  - asymptomatic, no metamorphopsia  - no indication for surgery at this time  - continue to monitor  5. Retinoschisis OD -- stable  - bullous schisis cavity, IT quadrant, from 0700-0830, -- ?less posterior extension  - no associated RT/RD but now with [facility] and exudates -- improving   - discussed findings and prognosis  - continue to monitor  6,7. Hypertensive retinopathy OU  - discussed importance of tight BP control  - continue to monitor  8. Pseudophakia OU  - s/p CE/IOL OU  - doing well  - continue to monitor  Ophthalmic Meds Ordered this visit:  Meds ordered this encounter  Medications   aflibercept (EYLEA) SOLN 2 mg   aflibercept (EYLEA) SOLN 2 mg     Return in about 8 weeks (around 04/26/2022) for f/u exu ARMD OU, DFE, OCT.  There are no Patient Instructions on file for this visit.   Explained the diagnoses, plan, and follow up with the patient and they expressed understanding.  Patient expressed understanding of the importance of proper follow up care.  This document serves as a record of services personally performed by Gardiner Sleeper, MD, PhD. It was created on their behalf by Renaldo Reel, [place] an ophthalmic technician. The creation of this record is the provider's dictation and/or activities during the  visit.    Electronically signed by:  Renaldo Reel, COT  02.19.24 11:17 PM  This document serves as a record of services personally performed by Gardiner Sleeper, MD, PhD. It was created on their behalf by San Jetty. Owens Shark, OA an ophthalmic technician. The creation of this record is the provider's dictation and/or activities during the visit.    Electronically signed by: San Jetty. Owens Shark, [place] 02.27.2024 11:17 PM  Gardiner Sleeper, M.D., Ph.D. Diseases & Surgery of the Retina and Vitreous [facility]  I have reviewed the above documentation for accuracy and completeness, and I agree with the above. Gardiner Sleeper, M.D., Ph.D. 03/01/22 11:19 PM  Abbreviations: M myopia (nearsighted); A astigmatism; H hyperopia (farsighted); P presbyopia; Mrx spectacle prescription;  CTL contact lenses; OD right eye; OS left eye; OU both  eyes  XT exotropia; ET esotropia; PEK punctate epithelial keratitis; PEE punctate epithelial erosions; DES dry eye syndrome; MGD meibomian gland dysfunction; ATs artificial tears; PFAT's preservative free artificial tears; [place] nuclear sclerotic cataract; PSC posterior subcapsular cataract; ERM epi-retinal membrane; PVD posterior vitreous detachment; RD retinal detachment; DM diabetes mellitus; DR diabetic retinopathy; NPDR non-proliferative diabetic retinopathy; PDR proliferative diabetic retinopathy; CSME clinically significant macular edema; DME diabetic macular edema; dbh dot blot hemorrhages; CWS cotton wool spot; POAG primary open angle glaucoma; C/D cup-to-disc ratio; HVF humphrey visual field; GVF goldmann visual field; OCT optical coherence tomography; IOP intraocular pressure; BRVO Branch retinal vein occlusion; CRVO central retinal vein occlusion; CRAO central retinal artery occlusion; BRAO branch retinal artery occlusion; RT retinal tear; SB scleral buckle; PPV pars plana vitrectomy; VH Vitreous hemorrhage; PRP panretinal laser photocoagulation; IVK  intravitreal kenalog; VMT vitreomacular traction; MH Macular hole;  NVD neovascularization of the disc; NVE neovascularization elsewhere; AREDS age related eye disease study; ARMD age related macular degeneration; POAG primary open angle glaucoma; EBMD epithelial/anterior basement membrane dystrophy; ACIOL anterior chamber intraocular lens; IOL intraocular lens; PCIOL posterior chamber intraocular lens; Phaco/IOL phacoemulsification with intraocular lens placement; [place] photorefractive keratectomy; LASIK laser assisted in situ keratomileusis; HTN hypertension; DM diabetes mellitus; COPD chronic obstructive pulmonary disease

## 2022-02-25 ENCOUNTER — Encounter (INDEPENDENT_AMBULATORY_CARE_PROVIDER_SITE_OTHER): Payer: Medicare Other | Admitting: Ophthalmology

## 2022-03-01 ENCOUNTER — Ambulatory Visit (INDEPENDENT_AMBULATORY_CARE_PROVIDER_SITE_OTHER): Payer: Medicare Other | Admitting: Ophthalmology

## 2022-03-01 ENCOUNTER — Encounter (INDEPENDENT_AMBULATORY_CARE_PROVIDER_SITE_OTHER): Payer: Self-pay | Admitting: Ophthalmology

## 2022-03-01 DIAGNOSIS — H35033 Hypertensive retinopathy, bilateral: Secondary | ICD-10-CM

## 2022-03-01 DIAGNOSIS — H35373 Puckering of macula, bilateral: Secondary | ICD-10-CM | POA: Diagnosis not present

## 2022-03-01 DIAGNOSIS — H353231 Exudative age-related macular degeneration, bilateral, with active choroidal neovascularization: Secondary | ICD-10-CM | POA: Diagnosis not present

## 2022-03-01 DIAGNOSIS — Z961 Presence of intraocular lens: Secondary | ICD-10-CM

## 2022-03-01 DIAGNOSIS — H3563 Retinal hemorrhage, bilateral: Secondary | ICD-10-CM | POA: Diagnosis not present

## 2022-03-01 DIAGNOSIS — I10 Essential (primary) hypertension: Secondary | ICD-10-CM

## 2022-03-01 DIAGNOSIS — H3322 Serous retinal detachment, left eye: Secondary | ICD-10-CM

## 2022-03-01 DIAGNOSIS — H33101 Unspecified retinoschisis, right eye: Secondary | ICD-10-CM

## 2022-03-01 MED ORDER — AFLIBERCEPT 2MG/0.05ML IZ SOLN FOR KALEIDOSCOPE
2.0000 mg | INTRAVITREAL | Status: AC | PRN
  Administered 2022-03-01: 2 mg via INTRAVITREAL

## 2022-03-02 NOTE — Addendum Note (Signed)
Addended by: Gardiner Sleeper on: 03/02/2022 12:53 PM   Modules accepted: Orders

## 2022-03-16 ENCOUNTER — Ambulatory Visit: Payer: Medicare Other | Admitting: Physician Assistant

## 2022-04-21 NOTE — Progress Notes (Signed)
[facility] - Clinic Note  04/27/2022     CHIEF COMPLAINT Patient presents for Retina Follow Up  HISTORY OF PRESENT ILLNESS: Tracey Rosario is a 89 y.o. female who presents to the clinic today for:   HPI     Retina Follow Up   Patient presents with  Wet AMD.  In both eyes.  This started months ago.  Duration of 8 weeks.  I, the attending physician,  performed the HPI with the patient and updated documentation appropriately.        Comments   Patient feels that the vision is the same. She is using Pred OS TID and Prolensa OS BID.       Last edited by Rennis Chris, MD on 04/27/2022  4:11 PM.      Referring physician: Courtney Paris, NP [address] [place],  [place] [address]  HISTORICAL INFORMATION:   Selected notes from the MEDICAL RECORD NUMBER Referred by Dr. Baker Pierini for retinal eval   CURRENT MEDICATIONS: Current Outpatient Medications (Ophthalmic Drugs)  Medication Sig   Bromfenac Sodium (PROLENSA) 0.07 % SOLN Place 1 drop into the left eye in the morning and at bedtime.   polyvinyl alcohol (LIQUIFILM TEARS) 1.4 % ophthalmic solution Place 1 drop into both eyes as needed for dry eyes.   prednisoLONE acetate (PRED FORTE) 1 % ophthalmic suspension Place 1 drop into the left eye 3 (three) times daily.   No current facility-administered medications for this visit. (Ophthalmic Drugs)   Current Outpatient Medications (Other)  Medication Sig   albuterol (VENTOLIN HFA) 108 (90 Base) MCG/ACT inhaler Inhale 2 puffs into the lungs every 6 (six) hours as needed for wheezing or shortness of breath.   amLODipine (NORVASC) 10 MG tablet Take 10 mg by mouth daily.   Cholecalciferol (VITAMIN D3) 125 MCG (5000 UT) TABS Take 5,000 Units by mouth daily.    citalopram (CELEXA) 20 MG tablet Take 20 mg by mouth daily.   donepezil (ARICEPT) 23 MG TABS tablet Take 23 mg by mouth daily.   ferrous gluconate (FERGON) 324 MG tablet Take 324 mg by mouth  daily with breakfast.   furosemide (LASIX) 20 MG tablet Take 1 tablet (20 mg total) by mouth daily.   gabapentin (NEURONTIN) 300 MG capsule Take 300 mg by mouth 3 (three) times daily as needed (for neuropathic pain).   levothyroxine (SYNTHROID) 75 MCG tablet Take 75 mcg by mouth daily before breakfast.   lisinopril (ZESTRIL) 10 MG tablet Take 10 mg by mouth daily.   memantine (NAMENDA) 5 MG tablet TAKE 1 TABLET BY MOUTH EVERYDAY AT BEDTIME (Patient taking differently: Take 5 mg by mouth at bedtime.)   Nystatin (GERHARDT'S BUTT CREAM) CREA Apply 1 Application topically daily.   omeprazole (PRILOSEC) 20 MG capsule TAKE 1 CAPSULE DAILY (Patient taking differently: Take 20 mg by mouth daily before breakfast.)   ondansetron (ZOFRAN) 4 MG tablet Take 1 tablet (4 mg total) by mouth every 6 (six) hours as needed for nausea or vomiting.   oxybutynin (DITROPAN-XL) 10 MG 24 hr tablet Take 10 mg by mouth daily.   oxybutynin (DITROPAN-XL) 5 MG 24 hr tablet Take 5 mg by mouth daily.   Probiotic Product (PROBIOTIC PO) Take 1 capsule by mouth daily after breakfast.   No current facility-administered medications for this visit. (Other)   REVIEW OF SYSTEMS: ROS   Positive for: Gastrointestinal, Neurological, Genitourinary, Musculoskeletal, Cardiovascular, Eyes Negative for: Constitutional, Skin, HENT, Endocrine, Respiratory, Psychiatric, Allergic/Imm, Heme/Lymph  Last edited by Julieanne Cotton, COT on 04/27/2022 12:59 PM.      ALLERGIES Allergies  Allergen Reactions   Codeine Other (See Comments)    Altered mental status   PAST MEDICAL HISTORY Past Medical History:  Diagnosis Date   Acid reflux    Anemia    Arthropathy    Bradycardia    beta blocker reduced in 2021 due to this   CKD (chronic kidney disease), stage III    Cognitive decline    referred to neurologist   Depression 10/07/2013   daughter didn't know anything about this   Dizziness    Frequent PVCs    GERD (gastroesophageal  reflux disease) 05/04/2013   Hip fx    r hip, bilat pubis rami   Hyperlipemia    Hypertension    Hypertensive retinopathy    OU   Hypothyroidism    Insomnia    Macular degeneration    Exu OS   Multilevel degenerative disc disease    Palpitations 05/04/2013   saw cardiologist in 2015, he deleted A. Fib from the record as per note not documentation to support   Premature atrial contractions    PSVT (paroxysmal supraventricular tachycardia)    a. short runs by monitor in 2021.   Short-term memory loss    Vitamin D deficiency    Past Surgical History:  Procedure Laterality Date   ABDOMINAL HYSTERECTOMY  1971   BLADDER SURGERY  2010   Broken Pelvis  2015   C-EYE SURGERY PROCEDURE Bilateral    zaldivar   CATARACT EXTRACTION Bilateral    Done in [place]   EYE SURGERY Bilateral    Cat Sx   HIP PINNING,CANNULATED Right 10/07/2013   Procedure: CANNULATED screws right hip;  Surgeon: Kathryne Hitch, MD;  Location: [facility] OR;  Service: Orthopedics;  Laterality: Right;   FAMILY HISTORY Family History  Problem Relation Age of Onset   Prostate cancer Father    Heart attack Sister    Cancer Brother    Cancer Sister    Heart attack Sister    Cancer Sister    Stroke Daughter    Dementia Neg Hx    Alzheimer's disease Neg Hx    SOCIAL HISTORY Social History   Tobacco Use   Smoking status: Former    Types: Cigarettes    Quit date: 08/02/1970    Years since quitting: 51.7   Smokeless tobacco: Never   Tobacco comments:    "smoked very little"  Vaping Use   Vaping Use: Never used  Substance Use Topics   Alcohol use: Never    Alcohol/week: 0.0 standard drinks of alcohol   Drug use: Never       OPHTHALMIC EXAM:  Base Eye Exam     Visual Acuity (Snellen - Linear)       Right Left   Dist [place] 20/30 +1 20/50   Dist ph [place] NI NI         Tonometry (Tonopen, 1:03 PM)       Right Left   Pressure 12 12         Pupils       Dark Light Shape React APD   Right 2 1  Round Slow None   Left 2 1 Round Slow None         Visual Fields       Left Right    Full Full         Extraocular Movement  Right Left    Full, Ortho Full, Ortho         Neuro/Psych     Oriented x3: Yes   Mood/Affect: Normal         Dilation     Both eyes: 1.0% Mydriacyl, 2.5% Phenylephrine @ 12:59 PM           Slit Lamp and Fundus Exam     Slit Lamp Exam       Right Left   Lids/Lashes Dermatochalasis - upper lid Dermatochalasis - upper lid   Conjunctiva/Sclera White and quiet White and quiet   Cornea Arcus, 1-2+ Punctate epithelial erosions, mild EBMD and corneal haze Arcus, 2-3+ Punctate epithelial erosions, EBMD, irregular epi   Anterior Chamber Deep and quiet Deep and quiet   Iris Round and dilated Round and moderately dilated to 5mm   Lens PC IOL in good position with open PC, anterior capsular phimosis PC IOL in good position with open PC   Anterior Vitreous Vitreous syneresis, Posterior vitreous detachment Vitreous syneresis; PVD; vitreous condensations         Fundus Exam       Right Left   Disc Mild temporal Pallor, Peripapillary atrophy, Sharp rim, Compact mild tilt, 2+ temporal Pallor, Peripapillary atrophy, Compact, Sharp rim   C/D Ratio 0.1 0.0   Macula Flat, Blunted foveal reflex, mild Epiretinal membrane, drusen, RPE mottling and clumping, No heme, interval improvement in  cystic changes centrally Flat, Blunted foveal reflex, RPE mottling and clumping, Epiretinal membrane, trace cystic changes, No heme   Vessels attenuated, Tortuous attenuated, mild tortuosity   Periphery IT schisis cavity from 0700-0830; otherwise attached; stable improvement in [facility] and exudation under schisis cavities [facility] not red and pigmenting in), exudation resolved, focal pigmented CR scar at 0630; no red heme temporal sub-retinal hemorrhage improved - now pigmented CR atrophy;  No SRF -- completely resolved, focal, punctate IRH - improved, shallow schisis IT  quad, No heme            IMAGING AND PROCEDURES  Imaging and Procedures for @TODAY @  OCT, Retina - OU - Both Eyes       Right Eye Quality was good. Central Foveal Thickness: 343. Progression has improved. Findings include normal foveal contour, no SRF, retinal drusen , epiretinal membrane, intraretinal fluid, macular pucker, pigment epithelial detachment (interval improvement in cystic changes centrally -- resolved, stable ERM with blunted foveal contour, bullous schisis with PEDs within IT quad caught on widefield -- not imaged today).   Left Eye Quality was good. Central Foveal Thickness: 358. Progression has worsened. Findings include no SRF, abnormal foveal contour, retinal drusen , epiretinal membrane, intraretinal fluid, preretinal fibrosis (Mild interval increase in central cystic changes/edema, persistent ERM with PRF).   Notes *Images captured and stored on drive  Diagnosis / Impression:  OD: interval improvement in cystic changes centrally -- resolved, stable ERM with blunted foveal contour, bullous schisis with PEDs within IT quad caught on widefield -- not imaged today OS: Mild interval increase in central cystic changes/edema, persistent ERM with PRF  Clinical management:  See below  Abbreviations: NFP - Normal foveal profile. CME - cystoid macular edema. PED - pigment epithelial detachment. IRF - intraretinal fluid. SRF - subretinal fluid. EZ - ellipsoid zone. ERM - epiretinal membrane. ORA - outer retinal atrophy. ORT - outer retinal tubulation. SRHM - subretinal hyper-reflective material      Intravitreal Injection, Pharmacologic Agent - OS - Left Eye       Time Out  04/27/2022. 1:30 PM. Confirmed correct patient, procedure, site, and patient consented.   Anesthesia Topical anesthesia was used. Anesthetic medications included Lidocaine 2%, Proparacaine 0.5%.   Procedure Preparation included 5% betadine to ocular surface, eyelid speculum. A (32g) needle was  used.   Injection: 6 mg faricimab-svoa 6 MG/0.05ML   Route: Intravitreal, Site: Left Eye   NDC: O8010301, Lot: [id number], Expiration date: 03/02/2024, Waste: 0 mL   Post-op Post injection exam found visual acuity of at least counting fingers. The patient tolerated the procedure well. There were no complications. The patient received written and verbal post procedure care education. Post injection medications were not given.      Color Fundus Photography Optos - OU - Both Eyes       Right Eye Progression has been stable. Disc findings include normal observations. Macula : retinal pigment epithelium abnormalities. Vessels : tortuous vessels, attenuated. Periphery : exudates, hemorrhage, RPE abnormality (Peripheral subretinal hemes IT periphery -- improved, surrounding exudates improved).   Left Eye Progression has been stable. Disc findings include normal observations. Macula : normal observations. Vessels : attenuated. Periphery : hemorrhage, RPE abnormality (Stable improvement in sub retinal hemorrhage temporally (0200) -- SRH now white and pigment clumping surrounding).   Notes **Images stored on drive**  Impression: OD: IT schisis; Peripheral subretinal hemes IT periphery -- improved, surrounding exudates improved -- PECHR OS: PEHCR with stably improved sub retinal hemorrhage temporally (0200)              ASSESSMENT/PLAN:    ICD-10-CM   1. Exudative age-related macular degeneration of both eyes with active choroidal neovascularization  H35.3231 OCT, Retina - OU - Both Eyes    Intravitreal Injection, Pharmacologic Agent - OS - Left Eye    faricimab-svoa (VABYSMO) 6mg /0.4mL intravitreal injection    2. Subretinal hemorrhage of both eyes  H35.63 Color Fundus Photography Optos - OU - Both Eyes    3. Serous retinal detachment of left eye  H33.22     4. 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 Essential hypertension  I10     7. Hypertensive retinopathy of both eyes  H35.033     8. Pseudophakia of both eyes  Z96.1      1-3. 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 Pearletha Furl, Ocular Oncologist, on 9.22.2020 -- no tumor on exam or b-scan  - s/p IVA OS #1 (08.05.20) for subretinal hemorrhage, peripheral CNV  - s/p IVA OD #1 (11.18.22), #2 (12.16.22), #3 (01.13.23), #4 (02.10.23), #5 (03.24.23)  - s/p IVE OD #1 (05.08.23), #2 (06.21.23), #3 (08.16.23), #4 (02.27.24)  - s/p IVE OS #1 (09.02.20) - sample, #2 (10.12.20), #3 (11.11.20), #4 (12.09.20), #5 (1.20.21), #6 (02.17.21), #7 (03.19.21), #8 (04.30.21), #9 (06.16.21), #10 (08.11.21), #11 (12.01.21), #12 (01.26.22), #13 (03.30.22), #14 (06.01.22), #15 (08.10.22), #16 (09.23.22), #17 (11.18.22), #18 (02.10.23), #19 (03.24.23), #20 (05.08.23), #21 (06.21.23), #22 (08.16.23), #23 (10.25.23), #24 (12.20.23), #25 (02.27.24)  - today BCVA 20/30 OD - stable;  OS 20/50 -- decreased  - exam shows improved peripheral SRH OU  - OCT shows OD: interval improvement in cystic changes centrally -- resolved, stable ERM with blunted foveal contour, bullous schisis with PEDs within IT quad caught on widefield -- not imaged today; OS: Mild interval increase in central cystic changes/edema, persistent ERM with PRF at 8 wks  - continue Prolensa and PF BID OS for possible CME component  - discussed IVE resistance and potential benefits of switching medication  - recommend switching to IVV OS #1 today  ,04.24.24 w/ f/u back to 6 wks - - will hold off on intravitreal injection OD w/ working plan to treat q4-14m  - pt in agreement  - RBA of procedure discussed, questions answered   - see procedure note  - IVV informed consent obtained and signed 04.24.24  - IVE OU informed consent obtained and signed, 05.08.23  - [email or web address] Benefits investigation initiated 09.02.2020 -- approved for 2023  - f/u 6 weeks, sooner prn, OCT/DFE/OPTOS COLORS OU, possible injections  4. Epiretinal membrane, OU.  - mild ERM OU  - asymptomatic, no metamorphopsia  - no indication for surgery at this time  - continue to monitor  5. Retinoschisis OD -- stable  - schisis cavity, IT quadrant, from 0700-0830, -- ?less posterior extension ?collapsing  - no associated RT/RD but now with [facility] and exudates -- improving   - discussed findings and prognosis  - continue to monitor  6,7. Hypertensive retinopathy OU  - discussed importance of tight BP control  - continue to monitor  8. Pseudophakia OU  - s/p CE/IOL OU  - doing well  - continue to monitor  Ophthalmic Meds Ordered this visit:  Meds ordered this encounter  Medications   faricimab-svoa (VABYSMO) /0.59mL intravitreal injection     Return in about 6 weeks (around 06/08/2022) for f/u exu ARMD OU, DFE, OCT, possible injxn.  There are no Patient Instructions on file for this visit.   Explained the diagnoses, plan, and follow up with the patient and they expressed understanding.  Patient expressed understanding of the importance of proper follow up care.  This document serves as a record of services personally performed by Karie Chimera, MD, PhD. It was created on their behalf by De Blanch, an ophthalmic technician. The creation of this record is the provider's dictation and/or activities during the visit.    Electronically signed by: De Blanch, OA, 04/27/22  4:11 PM  This document serves as a record of services personally performed by  Karie Chimera, MD, PhD. It was created on their behalf by Glee Arvin. Manson Passey, OA an ophthalmic technician. The creation of this record is the provider's dictation and/or activities during the visit.    Electronically signed by: Glee Arvin. [place], [place] 04.24.2024 4:11 PM  Karie Chimera, M.D., Ph.D. Diseases & Surgery of the Retina and Vitreous [facility]  I have reviewed the above documentation for accuracy and completeness, and I agree with the above. Karie Chimera, M.D., Ph.D. 04/27/22 4:14 PM  Abbreviations: M myopia (nearsighted); A astigmatism; H hyperopia (farsighted); P presbyopia; Mrx spectacle prescription;  CTL contact lenses; OD right eye; OS left eye; OU both eyes  XT exotropia; ET esotropia; PEK punctate epithelial keratitis; PEE punctate epithelial erosions; DES dry eye syndrome; MGD meibomian gland dysfunction; ATs artificial tears; PFAT's preservative free artificial tears; NSC nuclear sclerotic cataract; PSC posterior subcapsular cataract; ERM epi-retinal membrane; PVD posterior vitreous detachment; RD retinal detachment; DM  diabetes mellitus; DR diabetic retinopathy; NPDR non-proliferative diabetic retinopathy; PDR proliferative diabetic retinopathy; CSME clinically significant macular edema; DME diabetic macular edema; dbh dot blot hemorrhages; CWS cotton wool spot; POAG primary open angle glaucoma; C/D cup-to-disc ratio; HVF humphrey visual field; GVF goldmann visual field; OCT optical coherence tomography; IOP intraocular pressure; BRVO Branch retinal vein occlusion; CRVO central retinal vein occlusion; CRAO central retinal artery occlusion; BRAO branch retinal artery occlusion; RT retinal tear; SB scleral buckle; PPV pars plana vitrectomy; VH Vitreous hemorrhage; PRP panretinal laser photocoagulation; IVK intravitreal kenalog; VMT vitreomacular traction; MH Macular hole;  NVD neovascularization of the disc; NVE neovascularization elsewhere; AREDS age related eye  disease study; ARMD age related macular degeneration; POAG primary open angle glaucoma; EBMD epithelial/anterior basement membrane dystrophy; ACIOL anterior chamber intraocular lens; IOL intraocular lens; PCIOL posterior chamber intraocular lens; Phaco/IOL phacoemulsification with intraocular lens placement; PRK photorefractive keratectomy; LASIK laser assisted in situ keratomileusis; HTN hypertension; DM diabetes mellitus; COPD chronic obstructive pulmonary disease

## 2022-04-27 ENCOUNTER — Ambulatory Visit (INDEPENDENT_AMBULATORY_CARE_PROVIDER_SITE_OTHER): Payer: Medicare Other | Admitting: Ophthalmology

## 2022-04-27 ENCOUNTER — Encounter (INDEPENDENT_AMBULATORY_CARE_PROVIDER_SITE_OTHER): Payer: Self-pay | Admitting: Ophthalmology

## 2022-04-27 DIAGNOSIS — I10 Essential (primary) hypertension: Secondary | ICD-10-CM

## 2022-04-27 DIAGNOSIS — H353231 Exudative age-related macular degeneration, bilateral, with active choroidal neovascularization: Secondary | ICD-10-CM | POA: Diagnosis not present

## 2022-04-27 DIAGNOSIS — H35373 Puckering of macula, bilateral: Secondary | ICD-10-CM | POA: Diagnosis not present

## 2022-04-27 DIAGNOSIS — H3322 Serous retinal detachment, left eye: Secondary | ICD-10-CM | POA: Diagnosis not present

## 2022-04-27 DIAGNOSIS — H3563 Retinal hemorrhage, bilateral: Secondary | ICD-10-CM

## 2022-04-27 DIAGNOSIS — H35033 Hypertensive retinopathy, bilateral: Secondary | ICD-10-CM | POA: Diagnosis not present

## 2022-04-27 DIAGNOSIS — H33101 Unspecified retinoschisis, right eye: Secondary | ICD-10-CM

## 2022-04-27 DIAGNOSIS — Z961 Presence of intraocular lens: Secondary | ICD-10-CM

## 2022-04-27 MED ORDER — FARICIMAB-SVOA 6 MG/0.05ML IZ SOLN
6.0000 mg | INTRAVITREAL | Status: AC | PRN
  Administered 2022-04-27: 6 mg via INTRAVITREAL

## 2022-06-02 NOTE — Progress Notes (Signed)
[facility] - Clinic Note  06/08/2022     CHIEF COMPLAINT Patient presents for Retina Follow Up  HISTORY OF PRESENT ILLNESS: Tracey Rosario is a 89 y.o. female who presents to the clinic today for:   HPI     Retina Follow Up   Patient presents with  Wet AMD.  In both eyes.  This started 6 weeks ago.  I, the attending physician,  performed the HPI with the patient and updated documentation appropriately.        Comments   Patient here for 6 weeks retina follow up for exu ARMD OU. Patient states vision doing good. No eye pain.       Last edited by Rennis Chris, MD on 06/08/2022  5:17 PM.       Referring physician: Courtney Paris, NP [address] [place],  [place] [address]  HISTORICAL INFORMATION:   Selected notes from the MEDICAL RECORD NUMBER Referred by Dr. Baker Pierini for retinal eval   CURRENT MEDICATIONS: Current Outpatient Medications (Ophthalmic Drugs)  Medication Sig   Bromfenac Sodium (PROLENSA) 0.07 % SOLN Place 1 drop into the left eye in the morning and at bedtime.   polyvinyl alcohol (LIQUIFILM TEARS) 1.4 % ophthalmic solution Place 1 drop into both eyes as needed for dry eyes.   prednisoLONE acetate (PRED FORTE) 1 % ophthalmic suspension Place 1 drop into the left eye 3 (three) times daily.   No current facility-administered medications for this visit. (Ophthalmic Drugs)   Current Outpatient Medications (Other)  Medication Sig   albuterol (VENTOLIN HFA) 108 (90 Base) MCG/ACT inhaler Inhale 2 puffs into the lungs every 6 (six) hours as needed for wheezing or shortness of breath.   amLODipine (NORVASC) 10 MG tablet Take 10 mg by mouth daily.   Cholecalciferol (VITAMIN D3) 125 MCG (5000 UT) TABS Take 5,000 Units by mouth daily.    citalopram (CELEXA) 20 MG tablet Take 20 mg by mouth daily.   donepezil (ARICEPT) 23 MG TABS tablet Take 23 mg by mouth daily.   ferrous gluconate (FERGON) 324 MG tablet Take 324 mg by mouth daily  with breakfast.   furosemide (LASIX) 20 MG tablet Take 1 tablet (20 mg total) by mouth daily.   gabapentin (NEURONTIN) 300 MG capsule Take 300 mg by mouth 3 (three) times daily as needed (for neuropathic pain).   levothyroxine (SYNTHROID) 75 MCG tablet Take 75 mcg by mouth daily before breakfast.   lisinopril (ZESTRIL) 10 MG tablet Take 10 mg by mouth daily.   memantine (NAMENDA) 5 MG tablet TAKE 1 TABLET BY MOUTH EVERYDAY AT BEDTIME (Patient taking differently: Take 5 mg by mouth at bedtime.)   Nystatin (GERHARDT'S BUTT CREAM) CREA Apply 1 Application topically daily.   omeprazole (PRILOSEC) 20 MG capsule TAKE 1 CAPSULE DAILY (Patient taking differently: Take 20 mg by mouth daily before breakfast.)   ondansetron (ZOFRAN) 4 MG tablet Take 1 tablet (4 mg total) by mouth every 6 (six) hours as needed for nausea or vomiting.   oxybutynin (DITROPAN-XL) 10 MG 24 hr tablet Take 10 mg by mouth daily.   oxybutynin (DITROPAN-XL) 5 MG 24 hr tablet Take 5 mg by mouth daily.   Probiotic Product (PROBIOTIC PO) Take 1 capsule by mouth daily after breakfast.   No current facility-administered medications for this visit. (Other)   REVIEW OF SYSTEMS: ROS   Positive for: Gastrointestinal, Neurological, Genitourinary, Musculoskeletal, Cardiovascular, Eyes Negative for: Constitutional, Skin, HENT, Endocrine, Respiratory, Psychiatric, Allergic/Imm, Heme/Lymph Last  edited by Laddie Aquas, COA on 06/08/2022  1:27 PM.       ALLERGIES Allergies  Allergen Reactions   Codeine Other (See Comments)    Altered mental status   PAST MEDICAL HISTORY Past Medical History:  Diagnosis Date   Acid reflux    Anemia    Arthropathy    Bradycardia    beta blocker reduced in 2021 due to this   CKD (chronic kidney disease), stage III (HCC)    Cognitive decline    referred to neurologist   Depression 10/07/2013   daughter didn't know anything about this   Dizziness    Frequent PVCs    GERD (gastroesophageal  reflux disease) 05/04/2013   Hip fx (HCC)    r hip, bilat pubis rami   Hyperlipemia    Hypertension    Hypertensive retinopathy    OU   Hypothyroidism    Insomnia    Macular degeneration    Exu OS   Multilevel degenerative disc disease    Palpitations 05/04/2013   saw cardiologist in 2015, he deleted A. Fib from the record as per note not documentation to support   Premature atrial contractions    PSVT (paroxysmal supraventricular tachycardia)    a. short runs by monitor in 2021.   Short-term memory loss    Vitamin D deficiency    Past Surgical History:  Procedure Laterality Date   ABDOMINAL HYSTERECTOMY  1971   BLADDER SURGERY  2010   Broken Pelvis  2015   C-EYE SURGERY PROCEDURE Bilateral    zaldivar   CATARACT EXTRACTION Bilateral    Done in [place]   EYE SURGERY Bilateral    Cat Sx   HIP PINNING,CANNULATED Right 10/07/2013   Procedure: CANNULATED screws right hip;  Surgeon: Kathryne Hitch, MD;  Location: [facility] OR;  Service: Orthopedics;  Laterality: Right;   FAMILY HISTORY Family History  Problem Relation Age of Onset   Prostate cancer Father    Heart attack Sister    Cancer Brother    Cancer Sister    Heart attack Sister    Cancer Sister    Stroke Daughter    Dementia Neg Hx    Alzheimer's disease Neg Hx    SOCIAL HISTORY Social History   Tobacco Use   Smoking status: Former    Types: Cigarettes    Quit date: 08/02/1970    Years since quitting: 51.8   Smokeless tobacco: Never   Tobacco comments:    "smoked very little"  Vaping Use   Vaping Use: Never used  Substance Use Topics   Alcohol use: Never    Alcohol/week: 0.0 standard drinks of alcohol   Drug use: Never       OPHTHALMIC EXAM:  Base Eye Exam     Visual Acuity (Snellen - Linear)       Right Left   Dist [place] 20/30 +1 20/50 -1   Dist ph [place] NI NI         Tonometry (Tonopen, 1:25 PM)       Right Left   Pressure 10 15         Pupils       Dark Light Shape React APD    Right 2 1 Round Slow None   Left 2 1 Round Slow None         Visual Fields (Counting fingers)       Left Right    Full Full  Extraocular Movement       Right Left    Full, Ortho Full, Ortho         Neuro/Psych     Oriented x3: Yes   Mood/Affect: Normal         Dilation     Both eyes: 1.0% Mydriacyl, 2.5% Phenylephrine @ 1:25 PM           Slit Lamp and Fundus Exam     Slit Lamp Exam       Right Left   Lids/Lashes Dermatochalasis - upper lid Dermatochalasis - upper lid   Conjunctiva/Sclera White and quiet White and quiet   Cornea Arcus, 1-2+ Punctate epithelial erosions, mild EBMD and corneal haze Arcus, 2-3+ Punctate epithelial erosions, EBMD, irregular epi   Anterior Chamber Deep and quiet Deep and quiet   Iris Round and dilated Round and moderately dilated to 5mm   Lens PC IOL in good position with open PC, anterior capsular phimosis PC IOL in good position with open PC   Anterior Vitreous Vitreous syneresis, Posterior vitreous detachment Vitreous syneresis; PVD; vitreous condensations         Fundus Exam       Right Left   Disc Mild temporal Pallor, Peripapillary atrophy, Sharp rim, Compact mild tilt, 2+ temporal Pallor, Peripapillary atrophy, Compact, Sharp rim   C/D Ratio 0.1 0.0   Macula Flat, Blunted foveal reflex, mild Epiretinal membrane, drusen, RPE mottling and clumping, No heme, trace cystic changes centrally Flat, Blunted foveal reflex, RPE mottling and clumping, Epiretinal membrane, trace cystic changes -- improved, No heme   Vessels attenuated, Tortuous attenuated, mild tortuosity   Periphery IT schisis cavity from 0700-0830; otherwise attached; stable improvement in [facility] and exudation under schisis cavities [facility] not red and pigmenting in), exudation resolved, focal pigmented CR scar at 0630; no red heme temporal sub-retinal hemorrhage improved - now pigmented CR atrophy;  No SRF -- completely resolved, shallow schisis IT quad, No heme             IMAGING AND PROCEDURES  Imaging and Procedures for @TODAY @  OCT, Retina - OU - Both Eyes       Right Eye Quality was good. Central Foveal Thickness: 343. Progression has worsened. Findings include normal foveal contour, no SRF, retinal drusen , epiretinal membrane, intraretinal fluid, macular pucker, pigment epithelial detachment (Mild cystic changes centrally -- increased, stable ERM with blunted foveal contour, bullous schisis with PEDs within IT quad caught on widefield -- not imaged today).   Left Eye Quality was good. Central Foveal Thickness: 301. Progression has improved. Findings include no SRF, abnormal foveal contour, retinal drusen , epiretinal membrane, intraretinal fluid, preretinal fibrosis (interval improvement in central cystic changes/edema and foveal contour, persistent ERM with PRF).   Notes *Images captured and stored on drive  Diagnosis / Impression:  OD: interval improvement in cystic changes centrally -- resolved, stable ERM with blunted foveal contour, bullous schisis with PEDs within IT quad caught on widefield -- not imaged today OS: interval improvement in central cystic changes/edema and foveal contour, persistent ERM with PRF  Clinical management:  See below  Abbreviations: NFP - Normal foveal profile. CME - cystoid macular edema. PED - pigment epithelial detachment. IRF - intraretinal fluid. SRF - subretinal fluid. EZ - ellipsoid zone. ERM - epiretinal membrane. ORA - outer retinal atrophy. ORT - outer retinal tubulation. SRHM - subretinal hyper-reflective material      Color Fundus Photography Optos - OU - Both Eyes  Right Eye Progression has been stable. Disc findings include normal observations. Macula : retinal pigment epithelium abnormalities. Vessels : tortuous vessels, attenuated. Periphery : exudates, hemorrhage, RPE abnormality (Peripheral subretinal hemes IT periphery -- improved, surrounding exudates improved).   Left  Eye Progression has been stable. Disc findings include normal observations. Macula : normal observations. Vessels : attenuated. Periphery : hemorrhage, RPE abnormality (Stable improvement in sub retinal hemorrhage temporally (0200) -- SRH now white and pigment clumping surrounding).   Notes **Images stored on drive**  Impression: OD: IT schisis; Peripheral subretinal hemes IT periphery -- improved, surrounding exudates improved -- PECHR OS: PEHCR with stably improved sub retinal hemorrhage temporally (0200)       Intravitreal Injection, Pharmacologic Agent - OD - Right Eye       Time Out 06/08/2022. 1:59 PM. Confirmed correct patient, procedure, site, and patient consented.   Anesthesia Topical anesthesia was used. Anesthetic medications included Lidocaine 2%, Proparacaine 0.5%.   Procedure Preparation included 5% betadine to ocular surface, eyelid speculum. A (32g) needle was used.   Injection: 2 mg aflibercept 2 MG/0.05ML   Route: Intravitreal, Site: Right Eye   NDC: L6038910, Lot: [id number], Expiration date: 05/03/2023, Waste: 0 mL   Post-op Post injection exam found visual acuity of at least counting fingers. The patient tolerated the procedure well. There were no complications. The patient received written and verbal post procedure care education. Post injection medications were not given.      Intravitreal Injection, Pharmacologic Agent - OS - Left Eye       Time Out 06/08/2022. 1:59 PM. Confirmed correct patient, procedure, site, and patient consented.   Anesthesia Topical anesthesia was used. Anesthetic medications included Lidocaine 2%, Proparacaine 0.5%.   Procedure Preparation included 5% betadine to ocular surface, eyelid speculum. A (32g) needle was used.   Injection: 6 mg faricimab-svoa 6 MG/0.05ML   Route: Intravitreal, Site: Left Eye   NDC: O8010301, Lot: [id number], Expiration date: 04/02/2024, Waste: 0 mL   Post-op Post injection exam found  visual acuity of at least counting fingers. The patient tolerated the procedure well. There were no complications. The patient received written and verbal post procedure care education. Post injection medications were not given.            ASSESSMENT/PLAN:    ICD-10-CM   1. Exudative age-related macular degeneration of both eyes with active choroidal neovascularization (HCC)  H35.3231 OCT, Retina - OU - Both Eyes    Intravitreal Injection, Pharmacologic Agent - OD - Right Eye    Intravitreal Injection, Pharmacologic Agent - OS - Left Eye    faricimab-svoa (VABYSMO) 6mg /0.79mL intravitreal injection    aflibercept (EYLEA) SOLN 2 mg    2. Subretinal hemorrhage of both eyes  H35.63 Color Fundus Photography Optos - OU - Both Eyes    3. Serous retinal detachment of left eye  H33.22     4. 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 Essential hypertension  I10     7. Hypertensive retinopathy of both eyes  H35.033     8. Pseudophakia of both eyes  Z96.1       1-3. Exudative age related macular degeneration / PECHR OU  - interval conversion to exu ARMD OD noted on 11.18.22 exam -- new SRH and exudates IT periphery  - initially presented with symptomatic floaters OS  - initial exam showed large peripheral temporal subretinal hemorrhage OS -- spanning 2-430 --  also mild vitreous opacities and condensations that were causing symptomatic floaters  - B-scan (9.18.20) showed hyperreflective, ill-defined mass, 0300 periphery OS  - was seen by Dr. Pearletha Furl, Ocular Oncologist, on 9.22.2020 -- no tumor on exam or b-scan  - s/p IVA OS #1 (08.05.20) for subretinal hemorrhage, peripheral CNV  - s/p IVA OD #1 (11.18.22), #2 (12.16.22), #3 (01.13.23), #4 (02.10.23), #5 (03.24.23)  - s/p IVE OD #1 (05.08.23), #2 (06.21.23), #3 (08.16.23), #4 (02.27.24)  - s/p IVE OS #1 (09.02.20) - sample, #2 (10.12.20), #3 (11.11.20), #4 (12.09.20), #5 (1.20.21), #6  (02.17.21), #7 (03.19.21), #8 (04.30.21), #9 (06.16.21), #10 (08.11.21), #11 (12.01.21), #12 (01.26.22), #13 (03.30.22), #14 (06.01.22), #15 (08.10.22), #16 (09.23.22), #17 (11.18.22), #18 (02.10.23), #19 (03.24.23), #20 (05.08.23), #21 (06.21.23), #22 (08.16.23), #23 (10.25.23), #24 (12.20.23), #25 (02.27.24)  - s/p IVV OS #1 (04.24.24)  - today BCVA OD 20/30; OS 20/50 -- stable OU  - exam shows improved peripheral SRH OU  - OCT shows OD: Mild cystic changes centrally -- increased, stable ERM with blunted foveal contour, bullous schisis with PEDs within IT quad caught on widefield -- not imaged today; OS: interval improvement in central cystic changes/edema and foveal contour, persistent ERM with PRF at 6 weeks  - continue Prolensa and PF BID OS for possible CME component  - recommend IVE OD #5 and IVV OS #2 today, 06.05.24 w/ f/u in 6 wks   - treating OD prn  - pt in agreement  - RBA of procedure discussed, questions answered   - see procedure note  - IVV informed consent obtained and signed 04.24.24 (OS)  - IVE OU informed consent obtained and signed, 05.08.23  - Eylea4U Benefits investigation initiated 09.02.2020 -- approved for 2023  - f/u 6 weeks, sooner prn, OCT/DFE/**OPTOS COLORS OU**, possible injections  4. Epiretinal membrane, OU.  - mild ERM OU  - asymptomatic, no metamorphopsia  - no indication for surgery at this time  - continue to monitor  5. Retinoschisis OD -- stable  - schisis cavity, IT quadrant, from 0700-0830, -- ?less posterior extension ?collapsing  - no associated RT/RD but now with [facility] and exudates -- improving   - discussed findings and prognosis  - continue to monitor  6,7. Hypertensive retinopathy OU  - discussed importance of tight BP control  - continue to monitor  8. Pseudophakia OU  - s/p CE/IOL OU  - doing well  - continue to monitor  Ophthalmic Meds Ordered this visit:  Meds ordered this encounter  Medications   faricimab-svoa (VABYSMO)  6mg /0.12mL intravitreal injection   aflibercept (EYLEA) SOLN 2 mg     Return in about 6 weeks (around 07/20/2022) for f/u exu ARMD OU, DFE, OCT.  There are no Patient Instructions on file for this visit.   Explained the diagnoses, plan, and follow up with the patient and they expressed understanding.  Patient expressed understanding of the importance of proper follow up care.  This document serves as a record of services personally performed by Karie Chimera, MD, PhD. It was created on their behalf by De Blanch, an ophthalmic technician. The creation of this record is the provider's dictation and/or activities during the visit.    Electronically signed by: De Blanch, OA, 06/08/22  5:21 PM  This document serves as a record of services personally performed by Karie Chimera, MD, PhD. It was created on their behalf by Glee Arvin. Manson Passey, OA an ophthalmic technician. The creation of this record is the provider's dictation and/or  activities during the visit.    Electronically signed by: Glee Arvin. Manson Passey, OA @TODAY @ 5:21 PM  Karie Chimera, M.D., Ph.D. Diseases & Surgery of the Retina and Vitreous [facility]  I have reviewed the above documentation for accuracy and completeness, and I agree with the above. Karie Chimera, M.D., Ph.D. 06/08/22 5:23 PM  Abbreviations: M myopia (nearsighted); A astigmatism; H hyperopia (farsighted); P presbyopia; Mrx spectacle prescription;  CTL contact lenses; OD right eye; OS left eye; OU both eyes  XT exotropia; ET esotropia; PEK punctate epithelial keratitis; PEE punctate epithelial erosions; DES dry eye syndrome; MGD meibomian gland dysfunction; ATs artificial tears; PFAT's preservative free artificial tears; NSC nuclear sclerotic cataract; PSC posterior subcapsular cataract; ERM epi-retinal membrane; PVD posterior vitreous detachment; RD retinal detachment; DM diabetes mellitus; DR diabetic retinopathy; NPDR  non-proliferative diabetic retinopathy; PDR proliferative diabetic retinopathy; CSME clinically significant macular edema; DME diabetic macular edema; dbh dot blot hemorrhages; CWS cotton wool spot; POAG primary open angle glaucoma; C/D cup-to-disc ratio; HVF humphrey visual field; GVF goldmann visual field; OCT optical coherence tomography; IOP intraocular pressure; BRVO Branch retinal vein occlusion; CRVO central retinal vein occlusion; CRAO central retinal artery occlusion; BRAO branch retinal artery occlusion; RT retinal tear; SB scleral buckle; PPV pars plana vitrectomy; VH Vitreous hemorrhage; PRP panretinal laser photocoagulation; IVK intravitreal kenalog; VMT vitreomacular traction; MH Macular hole;  NVD neovascularization of the disc; NVE neovascularization elsewhere; AREDS age related eye disease study; ARMD age related macular degeneration; POAG primary open angle glaucoma; EBMD epithelial/anterior basement membrane dystrophy; ACIOL anterior chamber intraocular lens; IOL intraocular lens; PCIOL posterior chamber intraocular lens; Phaco/IOL phacoemulsification with intraocular lens placement; PRK photorefractive keratectomy; LASIK laser assisted in situ keratomileusis; HTN hypertension; DM diabetes mellitus; COPD chronic obstructive pulmonary disease

## 2022-06-08 ENCOUNTER — Ambulatory Visit (INDEPENDENT_AMBULATORY_CARE_PROVIDER_SITE_OTHER): Payer: Medicare Other | Admitting: Ophthalmology

## 2022-06-08 ENCOUNTER — Encounter (INDEPENDENT_AMBULATORY_CARE_PROVIDER_SITE_OTHER): Payer: Self-pay | Admitting: Ophthalmology

## 2022-06-08 DIAGNOSIS — H3563 Retinal hemorrhage, bilateral: Secondary | ICD-10-CM

## 2022-06-08 DIAGNOSIS — H3322 Serous retinal detachment, left eye: Secondary | ICD-10-CM | POA: Diagnosis not present

## 2022-06-08 DIAGNOSIS — H35373 Puckering of macula, bilateral: Secondary | ICD-10-CM | POA: Diagnosis not present

## 2022-06-08 DIAGNOSIS — H353231 Exudative age-related macular degeneration, bilateral, with active choroidal neovascularization: Secondary | ICD-10-CM | POA: Diagnosis not present

## 2022-06-08 DIAGNOSIS — H35033 Hypertensive retinopathy, bilateral: Secondary | ICD-10-CM

## 2022-06-08 DIAGNOSIS — Z961 Presence of intraocular lens: Secondary | ICD-10-CM

## 2022-06-08 DIAGNOSIS — I10 Essential (primary) hypertension: Secondary | ICD-10-CM

## 2022-06-08 DIAGNOSIS — H33101 Unspecified retinoschisis, right eye: Secondary | ICD-10-CM

## 2022-06-08 MED ORDER — AFLIBERCEPT 2MG/0.05ML IZ SOLN FOR KALEIDOSCOPE
2.0000 mg | INTRAVITREAL | Status: AC | PRN
  Administered 2022-06-08: 2 mg via INTRAVITREAL

## 2022-06-08 MED ORDER — FARICIMAB-SVOA 6 MG/0.05ML IZ SOLN
6.0000 mg | INTRAVITREAL | Status: AC | PRN
  Administered 2022-06-08: 6 mg via INTRAVITREAL

## 2022-07-15 NOTE — Progress Notes (Signed)
[facility] - Clinic Note  07/20/2022     CHIEF COMPLAINT Patient presents for Retina Follow Up  HISTORY OF PRESENT ILLNESS: Tracey Rosario is a 89 y.o. female who presents to the clinic today for:   HPI     Retina Follow Up   Patient presents with  Wet AMD.  In both eyes.  This started 6 weeks ago.  I, the attending physician,  performed the HPI with the patient and updated documentation appropriately.        Comments   Patient feels that the vision is the same. She is using Prolensa OS bid and Pred OS TID.       Last edited by Rennis Chris, MD on 07/23/2022  9:58 PM.     Referring physician: Courtney Paris, NP [address] [place],  [place] [address]  HISTORICAL INFORMATION:   Selected notes from the MEDICAL RECORD NUMBER Referred by Dr. Baker Pierini for retinal eval   CURRENT MEDICATIONS: Current Outpatient Medications (Ophthalmic Drugs)  Medication Sig   Bromfenac Sodium (PROLENSA) 0.07 % SOLN Place 1 drop into the left eye in the morning and at bedtime.   polyvinyl alcohol (LIQUIFILM TEARS) 1.4 % ophthalmic solution Place 1 drop into both eyes as needed for dry eyes.   prednisoLONE acetate (PRED FORTE) 1 % ophthalmic suspension Place 1 drop into the left eye 3 (three) times daily.   No current facility-administered medications for this visit. (Ophthalmic Drugs)   Current Outpatient Medications (Other)  Medication Sig   albuterol (VENTOLIN HFA) 108 (90 Base) MCG/ACT inhaler Inhale 2 puffs into the lungs every 6 (six) hours as needed for wheezing or shortness of breath.   amLODipine (NORVASC) 10 MG tablet Take 10 mg by mouth daily.   Cholecalciferol (VITAMIN D3) 125 MCG (5000 UT) TABS Take 5,000 Units by mouth daily.    citalopram (CELEXA) 20 MG tablet Take 20 mg by mouth daily.   donepezil (ARICEPT) 23 MG TABS tablet Take 23 mg by mouth daily.   ferrous gluconate (FERGON) 324 MG tablet Take 324 mg by mouth daily with breakfast.    furosemide (LASIX) 20 MG tablet Take 1 tablet (20 mg total) by mouth daily.   gabapentin (NEURONTIN) 300 MG capsule Take 300 mg by mouth 3 (three) times daily as needed (for neuropathic pain).   levothyroxine (SYNTHROID) 75 MCG tablet Take 75 mcg by mouth daily before breakfast.   lisinopril (ZESTRIL) 10 MG tablet Take 10 mg by mouth daily.   memantine (NAMENDA) 5 MG tablet TAKE 1 TABLET BY MOUTH EVERYDAY AT BEDTIME (Patient taking differently: Take 5 mg by mouth at bedtime.)   Nystatin (GERHARDT'S BUTT CREAM) CREA Apply 1 Application topically daily.   omeprazole (PRILOSEC) 20 MG capsule TAKE 1 CAPSULE DAILY (Patient taking differently: Take 20 mg by mouth daily before breakfast.)   ondansetron (ZOFRAN) 4 MG tablet Take 1 tablet (4 mg total) by mouth every 6 (six) hours as needed for nausea or vomiting.   oxybutynin (DITROPAN-XL) 10 MG 24 hr tablet Take 10 mg by mouth daily.   oxybutynin (DITROPAN-XL) 5 MG 24 hr tablet Take 5 mg by mouth daily.   Probiotic Product (PROBIOTIC PO) Take 1 capsule by mouth daily after breakfast.   No current facility-administered medications for this visit. (Other)   REVIEW OF SYSTEMS: ROS   Positive for: Gastrointestinal, Neurological, Genitourinary, Musculoskeletal, Cardiovascular, Eyes Negative for: Constitutional, Skin, HENT, Endocrine, Respiratory, Psychiatric, Allergic/Imm, Heme/Lymph Last edited by Laurey Morale  N, COT on 07/20/2022  1:15 PM.     ALLERGIES Allergies  Allergen Reactions   Codeine Other (See Comments)    Altered mental status   PAST MEDICAL HISTORY Past Medical History:  Diagnosis Date   Acid reflux    Anemia    Arthropathy    Bradycardia    beta blocker reduced in 2021 due to this   CKD (chronic kidney disease), stage III (HCC)    Cognitive decline    referred to neurologist   Depression 10/07/2013   daughter didn't know anything about this   Dizziness    Frequent PVCs    GERD (gastroesophageal reflux disease) 05/04/2013    Hip fx (HCC)    r hip, bilat pubis rami   Hyperlipemia    Hypertension    Hypertensive retinopathy    OU   Hypothyroidism    Insomnia    Macular degeneration    Exu OS   Multilevel degenerative disc disease    Palpitations 05/04/2013   saw cardiologist in 2015, he deleted A. Fib from the record as per note not documentation to support   Premature atrial contractions    PSVT (paroxysmal supraventricular tachycardia)    a. short runs by monitor in 2021.   Short-term memory loss    Vitamin D deficiency    Past Surgical History:  Procedure Laterality Date   ABDOMINAL HYSTERECTOMY  1971   BLADDER SURGERY  2010   Broken Pelvis  2015   C-EYE SURGERY PROCEDURE Bilateral    zaldivar   CATARACT EXTRACTION Bilateral    Done in [place]   EYE SURGERY Bilateral    Cat Sx   HIP PINNING,CANNULATED Right 10/07/2013   Procedure: CANNULATED screws right hip;  Surgeon: Kathryne Hitch, MD;  Location: [facility] OR;  Service: Orthopedics;  Laterality: Right;   FAMILY HISTORY Family History  Problem Relation Age of Onset   Prostate cancer Father    Heart attack Sister    Cancer Brother    Cancer Sister    Heart attack Sister    Cancer Sister    Stroke Daughter    Dementia Neg Hx    Alzheimer's disease Neg Hx    SOCIAL HISTORY Social History   Tobacco Use   Smoking status: Former    Current packs/day: 0.00    Types: Cigarettes    Quit date: 08/02/1970    Years since quitting: 52.0   Smokeless tobacco: Never   Tobacco comments:    "smoked very little"  Vaping Use   Vaping status: Never Used  Substance Use Topics   Alcohol use: Never    Alcohol/week: 0.0 standard drinks of alcohol   Drug use: Never       OPHTHALMIC EXAM:  Base Eye Exam     Visual Acuity (Snellen - Linear)       Right Left   Dist [place] 20/25 +2 20/50   Dist ph [place] NI NI         Tonometry (Tonopen, 1:20 PM)       Right Left   Pressure 14 15         Pupils       Dark Light Shape React APD    Right 2 1 Round Slow None   Left 2 1 Round Slow None         Visual Fields       Left Right    Full Full         Extraocular Movement  Right Left    Full, Ortho Full, Ortho         Neuro/Psych     Oriented x3: Yes   Mood/Affect: Normal         Dilation     Both eyes: 1.0% Mydriacyl, 2.5% Phenylephrine @ 1:17 PM           Slit Lamp and Fundus Exam     Slit Lamp Exam       Right Left   Lids/Lashes Dermatochalasis - upper lid Dermatochalasis - upper lid   Conjunctiva/Sclera White and quiet White and quiet   Cornea Arcus, 1-2+ Punctate epithelial erosions, mild EBMD and corneal haze Arcus, 2-3+ Punctate epithelial erosions, EBMD, irregular epi   Anterior Chamber Deep and quiet Deep and quiet   Iris Round and dilated Round and moderately dilated to 5mm   Lens PC IOL in good position with open PC, anterior capsular phimosis PC IOL in good position with open PC   Anterior Vitreous Vitreous syneresis, Posterior vitreous detachment Vitreous syneresis; PVD; vitreous condensations         Fundus Exam       Right Left   Disc Mild temporal Pallor, Peripapillary atrophy, Sharp rim, Compact mild tilt, 2+ temporal Pallor, Peripapillary atrophy, Compact, Sharp rim   C/D Ratio 0.1 0.0   Macula Flat, Blunted foveal reflex, mild Epiretinal membrane, drusen, RPE mottling and clumping, No heme, trace cystic changes centrally -- improved Flat, Blunted foveal reflex, RPE mottling and clumping, Epiretinal membrane, trace cystic changes -- improved, No heme   Vessels attenuated, mild tortuosity attenuated, mild tortuosity   Periphery IT schisis cavity from 0700-0830; otherwise attached; stable improvement in [facility] and exudation under schisis cavities [facility] not red and pigmenting in), exudation resolved, focal pigmented CR scar at 0630; no red heme temporal sub-retinal hemorrhage improved - now pigmented CR atrophy;  No SRF -- completely resolved, shallow schisis IT quad, No heme             IMAGING AND PROCEDURES  Imaging and Procedures for @TODAY @  OCT, Retina - OU - Both Eyes       Right Eye Quality was good. Central Foveal Thickness: 335. Progression has improved. Findings include normal foveal contour, no IRF, no SRF, retinal drusen , epiretinal membrane, macular pucker, pigment epithelial detachment (Mild cystic changes centrally -- improved, stable ERM with blunted foveal contour, bullous schisis with PEDs within IT quad caught on widefield -- not imaged today).   Left Eye Quality was good. Central Foveal Thickness: 305. Progression has improved. Findings include normal foveal contour, no IRF, no SRF, abnormal foveal contour, retinal drusen , epiretinal membrane, preretinal fibrosis (interval improvement in central cystic changes/edema and foveal contour, persistent ERM with PRF).   Notes *Images captured and stored on drive  Diagnosis / Impression:  OD: Mild cystic changes centrally -- improved, stable ERM with blunted foveal contour, bullous schisis with PEDs within IT quad caught on widefield -- not imaged today OS: interval improvement in central cystic changes/edema and foveal contour, persistent ERM with PRF  Clinical management:  See below  Abbreviations: NFP - Normal foveal profile. CME - cystoid macular edema. PED - pigment epithelial detachment. IRF - intraretinal fluid. SRF - subretinal fluid. EZ - ellipsoid zone. ERM - epiretinal membrane. ORA - outer retinal atrophy. ORT - outer retinal tubulation. SRHM - subretinal hyper-reflective material      Intravitreal Injection, Pharmacologic Agent - OS - Left Eye       Time Out  07/20/2022. 2:26 PM. Confirmed correct patient, procedure, site, and patient consented.   Anesthesia Topical anesthesia was used. Anesthetic medications included Lidocaine 2%, Proparacaine 0.5%.   Procedure Preparation included 5% betadine to ocular surface, eyelid speculum. A (32g) needle was used.    Injection: 6 mg faricimab-svoa 6 MG/0.05ML   Route: Intravitreal, Site: Left Eye   NDC: O8010301, Lot: [id number], Expiration date: 07/02/2024, Waste: 0 mL   Post-op Post injection exam found visual acuity of at least counting fingers. The patient tolerated the procedure well. There were no complications. The patient received written and verbal post procedure care education. Post injection medications were not given.            ASSESSMENT/PLAN:    ICD-10-CM   1. Exudative age-related macular degeneration of both eyes with active choroidal neovascularization (HCC)  H35.3231 OCT, Retina - OU - Both Eyes    Intravitreal Injection, Pharmacologic Agent - OS - Left Eye    faricimab-svoa (VABYSMO) 6mg /0.76mL intravitreal injection    2. Subretinal hemorrhage of both eyes  H35.63     3. Serous retinal detachment of left eye  H33.22     4. 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 Essential hypertension  I10     7. Hypertensive retinopathy of both eyes  H35.033     8. Pseudophakia of both eyes  Z96.1        1-3. 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 Pearletha Furl, Ocular Oncologist, on 9.22.2020 -- no tumor on exam or b-scan  - s/p IVA OS #1 (08.05.20) for subretinal hemorrhage, peripheral CNV  - s/p IVA OD #1 (11.18.22), #2 (12.16.22), #3 (01.13.23), #4 (02.10.23), #5 (03.24.23)  - s/p IVE OD #1 (05.08.23), #2 (06.21.23), #3 (08.16.23), #4 (02.27.24), #5 (06.05.24)  - s/p IVE OS #1 (09.02.20) -  sample, #2 (10.12.20), #3 (11.11.20), #4 (12.09.20), #5 (1.20.21), #6 (02.17.21), #7 (03.19.21), #8 (04.30.21), #9 (06.16.21), #10 (08.11.21), #11 (12.01.21), #12 (01.26.22), #13 (03.30.22), #14 (06.01.22), #15 (08.10.22), #16 (09.23.22), #17 (11.18.22), #18 (02.10.23), #19 (03.24.23), #20 (05.08.23), #21 (06.21.23), #22 (08.16.23), #23 (10.25.23), #24 (12.20.23), #25 (02.27.24)  - s/p IVV OS #1 (04.24.24), #2 (06.05.24)  - today BCVA OD 20/25; OS 20/50 -- stable OU  - exam shows improved peripheral SRH OU  - OCT shows OD: Mild cystic changes centrally -- improved, stable ERM with blunted foveal contour, bullous schisis with PEDs within IT quad caught on widefield -- not imaged today; OS: interval improvement in central cystic changes/edema and foveal contour, persistent ERM with PRF at 6 weeks  - continue Prolensa and PF BID OS for possible CME component  - recommend IVV OS #3 today, 07.17.24 w/ f/u in 6 wks   - treating OD prn  - pt in agreement  - RBA of procedure discussed, questions answered   - see procedure note  - IVV informed consent obtained and signed 04.24.24 (OS)  - [email or web address] Benefits investigation initiated 09.02.2020 -- approved for 2024  - f/u 6 weeks, sooner prn,  OCT/DFE/**OPTOS COLORS OU**, possible injections  4. Epiretinal membrane, OU.  - mild ERM OU  - asymptomatic, no metamorphopsia  - no indication for surgery at this time  - continue to monitor  5. Retinoschisis OD -- stable  - schisis cavity, IT quadrant, from 0700-0830, -- ?less posterior extension ?collapsing  - no associated RT/RD but now with [facility] and exudates -- improving   - discussed findings and prognosis  - continue to monitor  6,7. Hypertensive retinopathy OU  - discussed importance of tight BP control  - continue to monitor  8. Pseudophakia OU  - s/p CE/IOL OU  - doing well  - continue to monitor  Ophthalmic Meds Ordered this visit:  Meds ordered this encounter  Medications   faricimab-svoa  (VABYSMO) 6mg /0.40mL intravitreal injection     Return in about 6 weeks (around 08/31/2022) for f/u exu ARMD OU, DFE, OCT.  There are no Patient Instructions on file for this visit.   Explained the diagnoses, plan, and follow up with the patient and they expressed understanding.  Patient expressed understanding of the importance of proper follow up care.  This document serves as a record of services personally performed by Karie Chimera, MD, PhD. It was created on their behalf by De Blanch, an ophthalmic technician. The creation of this record is the provider's dictation and/or activities during the visit.    Electronically signed by: De Blanch, OA, 07/23/22  9:58 PM  This document serves as a record of services personally performed by Karie Chimera, MD, PhD. It was created on their behalf by Glee Arvin. Manson Passey, OA an ophthalmic technician. The creation of this record is the provider's dictation and/or activities during the visit.    Electronically signed by: Glee Arvin. Manson Passey, OA 07/23/22 9:58 PM   Karie Chimera, M.D., Ph.D. Diseases & Surgery of the Retina and Vitreous [facility]  I have reviewed the above documentation for accuracy and completeness, and I agree with the above. Karie Chimera, M.D., Ph.D. 07/23/22 9:59 PM   Abbreviations: M myopia (nearsighted); A astigmatism; H hyperopia (farsighted); P presbyopia; Mrx spectacle prescription;  CTL contact lenses; OD right eye; OS left eye; OU both eyes  XT exotropia; ET esotropia; PEK punctate epithelial keratitis; PEE punctate epithelial erosions; DES dry eye syndrome; MGD meibomian gland dysfunction; ATs artificial tears; PFAT's preservative free artificial tears; NSC nuclear sclerotic cataract; PSC posterior subcapsular cataract; ERM epi-retinal membrane; PVD posterior vitreous detachment; RD retinal detachment; DM diabetes mellitus; DR diabetic retinopathy; NPDR non-proliferative diabetic  retinopathy; PDR proliferative diabetic retinopathy; CSME clinically significant macular edema; DME diabetic macular edema; dbh dot blot hemorrhages; CWS cotton wool spot; POAG primary open angle glaucoma; C/D cup-to-disc ratio; HVF humphrey visual field; GVF goldmann visual field; OCT optical coherence tomography; IOP intraocular pressure; BRVO Branch retinal vein occlusion; CRVO central retinal vein occlusion; CRAO central retinal artery occlusion; BRAO branch retinal artery occlusion; RT retinal tear; SB scleral buckle; PPV pars plana vitrectomy; VH Vitreous hemorrhage; PRP panretinal laser photocoagulation; IVK intravitreal kenalog; VMT vitreomacular traction; MH Macular hole;  NVD neovascularization of the disc; NVE neovascularization elsewhere; AREDS age related eye disease study; ARMD age related macular degeneration; POAG primary open angle glaucoma; EBMD epithelial/anterior basement membrane dystrophy; ACIOL anterior chamber intraocular lens; IOL intraocular lens; PCIOL posterior chamber intraocular lens; Phaco/IOL phacoemulsification with intraocular lens placement; PRK photorefractive keratectomy; LASIK laser assisted in situ keratomileusis; HTN hypertension; DM diabetes mellitus; COPD chronic obstructive pulmonary disease

## 2022-07-20 ENCOUNTER — Encounter (INDEPENDENT_AMBULATORY_CARE_PROVIDER_SITE_OTHER): Payer: Self-pay | Admitting: Ophthalmology

## 2022-07-20 ENCOUNTER — Ambulatory Visit (INDEPENDENT_AMBULATORY_CARE_PROVIDER_SITE_OTHER): Payer: Medicare Other | Admitting: Ophthalmology

## 2022-07-20 DIAGNOSIS — H3322 Serous retinal detachment, left eye: Secondary | ICD-10-CM

## 2022-07-20 DIAGNOSIS — H353231 Exudative age-related macular degeneration, bilateral, with active choroidal neovascularization: Secondary | ICD-10-CM | POA: Diagnosis not present

## 2022-07-20 DIAGNOSIS — H35373 Puckering of macula, bilateral: Secondary | ICD-10-CM | POA: Diagnosis not present

## 2022-07-20 DIAGNOSIS — I10 Essential (primary) hypertension: Secondary | ICD-10-CM

## 2022-07-20 DIAGNOSIS — Z961 Presence of intraocular lens: Secondary | ICD-10-CM

## 2022-07-20 DIAGNOSIS — H3563 Retinal hemorrhage, bilateral: Secondary | ICD-10-CM | POA: Diagnosis not present

## 2022-07-20 DIAGNOSIS — H35033 Hypertensive retinopathy, bilateral: Secondary | ICD-10-CM

## 2022-07-20 DIAGNOSIS — H33101 Unspecified retinoschisis, right eye: Secondary | ICD-10-CM

## 2022-07-20 MED ORDER — FARICIMAB-SVOA 6 MG/0.05ML IZ SOLN
6.0000 mg | INTRAVITREAL | Status: AC | PRN
  Administered 2022-07-20: 6 mg via INTRAVITREAL

## 2022-08-25 NOTE — Progress Notes (Signed)
[facility] - Clinic Note  08/31/2022     CHIEF COMPLAINT Patient presents for Retina Follow Up  HISTORY OF PRESENT ILLNESS: Tracey Rosario is a 89 y.o. female who presents to the clinic today for:   HPI     Retina Follow Up   Patient presents with  Wet AMD.  In both eyes.  This started months ago.  Duration of 6 weeks.  I, the attending physician,  performed the HPI with the patient and updated documentation appropriately.        Comments   Patient states that the vision is the same. She is using Prolensa OS BID and Pred OS TID.       Last edited by Rennis Chris, MD on 09/01/2022 12:00 PM.      Referring physician: Courtney Paris, NP [address] [place],  [place] [address]  HISTORICAL INFORMATION:   Selected notes from the MEDICAL RECORD NUMBER Referred by Dr. Baker Pierini for retinal eval   CURRENT MEDICATIONS: Current Outpatient Medications (Ophthalmic Drugs)  Medication Sig   Bromfenac Sodium (PROLENSA) 0.07 % SOLN Place 1 drop into the left eye in the morning and at bedtime.   polyvinyl alcohol (LIQUIFILM TEARS) 1.4 % ophthalmic solution Place 1 drop into both eyes as needed for dry eyes.   prednisoLONE acetate (PRED FORTE) 1 % ophthalmic suspension Place 1 drop into the left eye 3 (three) times daily.   No current facility-administered medications for this visit. (Ophthalmic Drugs)   Current Outpatient Medications (Other)  Medication Sig   albuterol (VENTOLIN HFA) 108 (90 Base) MCG/ACT inhaler Inhale 2 puffs into the lungs every 6 (six) hours as needed for wheezing or shortness of breath.   amLODipine (NORVASC) 10 MG tablet Take 10 mg by mouth daily.   Cholecalciferol (VITAMIN D3) 125 MCG (5000 UT) TABS Take 5,000 Units by mouth daily.    citalopram (CELEXA) 20 MG tablet Take 20 mg by mouth daily.   donepezil (ARICEPT) 23 MG TABS tablet Take 23 mg by mouth daily.   ferrous gluconate (FERGON) 324 MG tablet Take 324 mg by mouth  daily with breakfast.   furosemide (LASIX) 20 MG tablet Take 1 tablet (20 mg total) by mouth daily.   gabapentin (NEURONTIN) 300 MG capsule Take 300 mg by mouth 3 (three) times daily as needed (for neuropathic pain).   levothyroxine (SYNTHROID) 75 MCG tablet Take 75 mcg by mouth daily before breakfast.   lisinopril (ZESTRIL) 10 MG tablet Take 10 mg by mouth daily.   memantine (NAMENDA) 5 MG tablet TAKE 1 TABLET BY MOUTH EVERYDAY AT BEDTIME (Patient taking differently: Take 5 mg by mouth at bedtime.)   Nystatin (GERHARDT'S BUTT CREAM) CREA Apply 1 Application topically daily.   omeprazole (PRILOSEC) 20 MG capsule TAKE 1 CAPSULE DAILY (Patient taking differently: Take 20 mg by mouth daily before breakfast.)   ondansetron (ZOFRAN) 4 MG tablet Take 1 tablet (4 mg total) by mouth every 6 (six) hours as needed for nausea or vomiting.   oxybutynin (DITROPAN-XL) 10 MG 24 hr tablet Take 10 mg by mouth daily.   oxybutynin (DITROPAN-XL) 5 MG 24 hr tablet Take 5 mg by mouth daily.   Probiotic Product (PROBIOTIC PO) Take 1 capsule by mouth daily after breakfast.   No current facility-administered medications for this visit. (Other)   REVIEW OF SYSTEMS: ROS   Positive for: Gastrointestinal, Neurological, Genitourinary, Musculoskeletal, Cardiovascular, Eyes Negative for: Constitutional, Skin, HENT, Endocrine, Respiratory, Psychiatric, Allergic/Imm, Heme/Lymph Last  edited by Charlette Caffey, COT on 08/31/2022  1:06 PM.      ALLERGIES Allergies  Allergen Reactions   Codeine Other (See Comments)    Altered mental status   PAST MEDICAL HISTORY Past Medical History:  Diagnosis Date   Acid reflux    Anemia    Arthropathy    Bradycardia    beta blocker reduced in 2021 due to this   CKD (chronic kidney disease), stage III (HCC)    Cognitive decline    referred to neurologist   Depression 10/07/2013   daughter didn't know anything about this   Dizziness    Frequent PVCs    GERD  (gastroesophageal reflux disease) 05/04/2013   Hip fx (HCC)    r hip, bilat pubis rami   Hyperlipemia    Hypertension    Hypertensive retinopathy    OU   Hypothyroidism    Insomnia    Macular degeneration    Exu OS   Multilevel degenerative disc disease    Palpitations 05/04/2013   saw cardiologist in 2015, he deleted A. Fib from the record as per note not documentation to support   Premature atrial contractions    PSVT (paroxysmal supraventricular tachycardia)    a. short runs by monitor in 2021.   Short-term memory loss    Vitamin D deficiency    Past Surgical History:  Procedure Laterality Date   ABDOMINAL HYSTERECTOMY  1971   BLADDER SURGERY  2010   Broken Pelvis  2015   C-EYE SURGERY PROCEDURE Bilateral    zaldivar   CATARACT EXTRACTION Bilateral    Done in [place]   EYE SURGERY Bilateral    Cat Sx   HIP PINNING,CANNULATED Right 10/07/2013   Procedure: CANNULATED screws right hip;  Surgeon: Kathryne Hitch, MD;  Location: [facility] OR;  Service: Orthopedics;  Laterality: Right;   FAMILY HISTORY Family History  Problem Relation Age of Onset   Prostate cancer Father    Heart attack Sister    Cancer Brother    Cancer Sister    Heart attack Sister    Cancer Sister    Stroke Daughter    Dementia Neg Hx    Alzheimer's disease Neg Hx    SOCIAL HISTORY Social History   Tobacco Use   Smoking status: Former    Current packs/day: 0.00    Types: Cigarettes    Quit date: 08/02/1970    Years since quitting: 52.1   Smokeless tobacco: Never   Tobacco comments:    "smoked very little"  Vaping Use   Vaping status: Never Used  Substance Use Topics   Alcohol use: Never    Alcohol/week: 0.0 standard drinks of alcohol   Drug use: Never       OPHTHALMIC EXAM:  Base Eye Exam     Visual Acuity (Snellen - Linear)       Right Left   Dist [place] 20/25 20/50   Dist ph [place] NI NI         Tonometry (Tonopen, 1:10 PM)       Right Left   Pressure 13 15         Pupils        Dark Light Shape React APD   Right 2 1 Round Slow None   Left 2 1 Round Slow None         Visual Fields       Left Right    Full Full  Extraocular Movement       Right Left    Full, Ortho Full, Ortho         Neuro/Psych     Oriented x3: Yes   Mood/Affect: Normal         Dilation     Both eyes: 1.0% Mydriacyl, 2.5% Phenylephrine @ 1:07 PM           Slit Lamp and Fundus Exam     Slit Lamp Exam       Right Left   Lids/Lashes Dermatochalasis - upper lid Dermatochalasis - upper lid   Conjunctiva/Sclera White and quiet White and quiet   Cornea Arcus, 1-2+ Punctate epithelial erosions, mild EBMD and corneal haze Arcus, 2-3+ Punctate epithelial erosions, EBMD, irregular epi   Anterior Chamber Deep and quiet Deep and quiet   Iris Round and dilated Round and moderately dilated to 5mm   Lens PC IOL in good position with open PC, anterior capsular phimosis PC IOL in good position with open PC   Anterior Vitreous Vitreous syneresis, Posterior vitreous detachment Vitreous syneresis; PVD; vitreous condensations         Fundus Exam       Right Left   Disc Mild temporal Pallor, Peripapillary atrophy, Sharp rim, Compact mild tilt, 2+ temporal Pallor, Peripapillary atrophy, Compact, Sharp rim   C/D Ratio 0.1 0.0   Macula Flat, Blunted foveal reflex, mild Epiretinal membrane, drusen, RPE mottling and clumping, No heme, trace cystic changes centrally -- stably improved Flat, Blunted foveal reflex, RPE mottling and clumping, Epiretinal membrane, trace cystic changes, No heme   Vessels attenuated, mild tortuosity attenuated, mild tortuosity   Periphery IT schisis cavity from 0700-0830; otherwise attached; stable improvement in [facility] and exudation under schisis cavities [facility] not red and pigmenting in), exudation resolved, focal pigmented CR scar at 0630; no red heme temporal sub-retinal hemorrhage improved - now pigmented CR atrophy;  No SRF -- completely resolved,  shallow schisis IT quad, No heme            IMAGING AND PROCEDURES  Imaging and Procedures for @TODAY @  OCT, Retina - OU - Both Eyes       Right Eye Quality was good. Central Foveal Thickness: 352. Progression has been stable. Findings include normal foveal contour, no IRF, no SRF, retinal drusen , epiretinal membrane, macular pucker, pigment epithelial detachment (Mild cystic changes centrally, stable ERM with blunted foveal contour, bullous schisis with PEDs within IT quad caught on widefield).   Left Eye Quality was good. Central Foveal Thickness: 292. Progression has worsened. Findings include normal foveal contour, no SRF, abnormal foveal contour, retinal drusen , epiretinal membrane, preretinal fibrosis (interval increase in central cystic changes/edema, persistent ERM with PRF).   Notes *Images captured and stored on drive  Diagnosis / Impression:  OD: Mild cystic changes centrally, stable ERM with blunted foveal contour, bullous schisis with PEDs within IT quad caught on widefield  OS: interval increase in central cystic changes/edema, persistent ERM with PRF  Clinical management:  See below  Abbreviations: NFP - Normal foveal profile. CME - cystoid macular edema. PED - pigment epithelial detachment. IRF - intraretinal fluid. SRF - subretinal fluid. EZ - ellipsoid zone. ERM - epiretinal membrane. ORA - outer retinal atrophy. ORT - outer retinal tubulation. SRHM - subretinal hyper-reflective material      Color Fundus Photography Optos - OU - Both Eyes       Right Eye Progression has been stable. Disc findings include normal observations. Macula :  retinal pigment epithelium abnormalities. Vessels : tortuous vessels, attenuated. Periphery : exudates, hemorrhage, RPE abnormality (Peripheral subretinal hemes IT periphery -- improved, surrounding exudates improved).   Left Eye Progression has been stable. Disc findings include normal observations. Macula : normal  observations. Vessels : attenuated. Periphery : hemorrhage, RPE abnormality (Stable improvement in sub retinal hemorrhage temporally (0200) -- SRH now white and pigment clumping surrounding).   Notes **Images stored on drive**  Impression: OD: IT schisis; Peripheral subretinal hemes IT periphery --stably improved, surrounding exudates improved -- PECHR OS: PEHCR with stably improved sub retinal hemorrhage temporally (0200)       Intravitreal Injection, Pharmacologic Agent - OS - Left Eye       Time Out 08/31/2022. 2:09 PM. Confirmed correct patient, procedure, site, and patient consented.   Anesthesia Topical anesthesia was used. Anesthetic medications included Lidocaine 2%, Proparacaine 0.5%.   Procedure Preparation included 5% betadine to ocular surface, eyelid speculum. A (32g) needle was used.   Injection: 6 mg faricimab-svoa 6 MG/0.05ML   Route: Intravitreal, Site: Left Eye   NDC: O8010301, Lot: [id number], Expiration date: 09/02/2024, Waste: 0 mL   Post-op Post injection exam found visual acuity of at least counting fingers. The patient tolerated the procedure well. There were no complications. The patient received written and verbal post procedure care education. Post injection medications were not given.            ASSESSMENT/PLAN:    ICD-10-CM   1. Exudative age-related macular degeneration of both eyes with active choroidal neovascularization (HCC)  H35.3231 OCT, Retina - OU - Both Eyes    Intravitreal Injection, Pharmacologic Agent - OS - Left Eye    faricimab-svoa (VABYSMO) 6mg /0.26mL intravitreal injection    2. Subretinal hemorrhage of both eyes  H35.63 Color Fundus Photography Optos - OU - Both Eyes    3. Serous retinal detachment of left eye  H33.22     4. 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 Essential hypertension  I10     7. Hypertensive  retinopathy of both eyes  H35.033     8. Pseudophakia of both eyes  Z96.1      1-3. Exudative age related macular degeneration / PECHR OU - interval conversion to exu ARMD OD noted on 11.18.22 exam -- new SRH and exudates IT periphery  - initially presented with symptomatic floaters OS - initial exam showed large peripheral temporal subretinal hemorrhage OS -- spanning 2-430 -- also mild vitreous opacities and condensations that were causing symptomatic floaters  - B-scan (9.18.20) showed hyperreflective, ill-defined mass, 0300 periphery OS - was seen by Dr. Pearletha Furl, Ocular Oncologist, on 9.22.2020 -- no tumor on exam or b-scan  - s/p IVA OS #1 (08.05.20) for subretinal hemorrhage, peripheral CNV - s/p IVA OD #1 (11.18.22), #2 (12.16.22), #3 (01.13.23), #4 (02.10.23), #5 (03.24.23) - s/p IVE OD #1 (05.08.23), #2 (06.21.23), #3 (08.16.23), #4 (02.27.24), #5 (06.05.24) - s/p IVE OS #1 (09.02.20) - sample, #2 (10.12.20), #3 (11.11.20), #4 (12.09.20), #5 (1.20.21), #6 (02.17.21), #7 (03.19.21), #8 (04.30.21), #9 (06.16.21), #10 (08.11.21), #11 (12.01.21), #12 (01.26.22), #13 (03.30.22), #14 (06.01.22), #15 (08.10.22), #16 (09.23.22), #17 (11.18.22), #18 (02.10.23), #19 (03.24.23), #20 (05.08.23), #21 (06.21.23), #22 (08.16.23), #23 (10.25.23), #24 (12.20.23), #25 (02.27.24)  - s/p IVV OS #1 (04.24.24), #2 (06.05.24), #3 (07.17.24)  - today BCVA OD 20/25; OS 20/50 -- stable OU  -  exam shows improved peripheral SRH OU - OCT shows OD: Mild cystic changes centrally,stable ERM with blunted foveal contour, bullous schisis with PEDs within IT quad caught on widefield; OS: interval increase in central cystic changes/edema, persistent ERM with PRF at 6 weeks  - continue Prolensa and PF BID OS for possible CME component  - recommend IVV OS #4 today, 08.28.24 w/ f/u in 6 wks   - treating OD prn -- recommend holding again today  - pt in agreement  - RBA of procedure discussed, questions answered   - see  procedure note  - IVV informed consent obtained and signed 04.24.24 (OS)  - [id number] investigation initiated 09.02.2020 -- approved for 2024 - f/u 6 weeks, sooner prn, OCT/DFE/**OPTOS COLORS OU**, possible injections  4. Epiretinal membrane, OU.  - mild ERM OU  - asymptomatic, no metamorphopsia  - no indication for surgery at this time  - continue to monitor  5. Retinoschisis OD -- stable - schisis cavity, IT quadrant, from 0700-0830, -- ?less posterior extension ?collapsing  - no associated RT/RD but now with [facility] and exudates -- improving   - discussed findings and prognosis  - continue to monitor  6,7. Hypertensive retinopathy OU  - discussed importance of tight BP control  - continue to monitor  8. Pseudophakia OU  - s/p CE/IOL OU  - doing well  - continue to monitor  Ophthalmic Meds Ordered this visit:  Meds ordered this encounter  Medications   faricimab-svoa (VABYSMO) 6mg /0.70mL intravitreal injection     Return in about 6 weeks (around 10/12/2022) for f/u Ex. AMD OU, DFE, OCT, Optos color photos, Possible, IVV, OS.  There are no Patient Instructions on file for this visit.   Explained the diagnoses, plan, and follow up with the patient and they expressed understanding.  Patient expressed understanding of the importance of proper follow up care.  This document serves as a record of services personally performed by Karie Chimera, MD, PhD. It was created on their behalf by De Blanch, an ophthalmic technician. The creation of this record is the provider's dictation and/or activities during the visit.    Electronically signed by: De Blanch, OA, 09/01/22  12:02 PM  This document serves as a record of services personally performed by Karie Chimera, MD, PhD. It was created on their behalf by Laurey Morale, COT an ophthalmic technician. The creation of this record is the provider's dictation and/or activities during the visit.    Electronically  signed by:  Charlette Caffey, COT  09/01/22 12:02 PM  Karie Chimera, M.D., Ph.D. Diseases & Surgery of the Retina and Vitreous [facility]  I have reviewed the above documentation for accuracy and completeness, and I agree with the above. Karie Chimera, M.D., Ph.D. 09/01/22 12:02 PM   Abbreviations: M myopia (nearsighted); A astigmatism; H hyperopia (farsighted); P presbyopia; Mrx spectacle prescription;  CTL contact lenses; OD right eye; OS left eye; OU both eyes  XT exotropia; ET esotropia; PEK punctate epithelial keratitis; PEE punctate epithelial erosions; DES dry eye syndrome; MGD meibomian gland dysfunction; ATs artificial tears; PFAT's preservative free artificial tears; NSC nuclear sclerotic cataract; PSC posterior subcapsular cataract; ERM epi-retinal membrane; PVD posterior vitreous detachment; RD retinal detachment; DM diabetes mellitus; DR diabetic retinopathy; NPDR non-proliferative diabetic retinopathy; PDR proliferative diabetic retinopathy; CSME clinically significant macular edema; DME diabetic macular edema; dbh dot blot hemorrhages; CWS cotton wool spot; POAG primary open angle glaucoma; C/D cup-to-disc ratio; HVF humphrey  visual field; GVF goldmann visual field; OCT optical coherence tomography; IOP intraocular pressure; BRVO Branch retinal vein occlusion; CRVO central retinal vein occlusion; CRAO central retinal artery occlusion; BRAO branch retinal artery occlusion; RT retinal tear; SB scleral buckle; PPV pars plana vitrectomy; VH Vitreous hemorrhage; PRP panretinal laser photocoagulation; IVK intravitreal kenalog; VMT vitreomacular traction; MH Macular hole;  NVD neovascularization of the disc; NVE neovascularization elsewhere; AREDS age related eye disease study; ARMD age related macular degeneration; POAG primary open angle glaucoma; EBMD epithelial/anterior basement membrane dystrophy; ACIOL anterior chamber intraocular lens; IOL intraocular lens;  PCIOL posterior chamber intraocular lens; Phaco/IOL phacoemulsification with intraocular lens placement; PRK photorefractive keratectomy; LASIK laser assisted in situ keratomileusis; HTN hypertension; DM diabetes mellitus; COPD chronic obstructive pulmonary disease

## 2022-08-31 ENCOUNTER — Ambulatory Visit (INDEPENDENT_AMBULATORY_CARE_PROVIDER_SITE_OTHER): Payer: Medicare Other | Admitting: Ophthalmology

## 2022-08-31 ENCOUNTER — Encounter (INDEPENDENT_AMBULATORY_CARE_PROVIDER_SITE_OTHER): Payer: Self-pay | Admitting: Ophthalmology

## 2022-08-31 DIAGNOSIS — H3563 Retinal hemorrhage, bilateral: Secondary | ICD-10-CM | POA: Diagnosis not present

## 2022-08-31 DIAGNOSIS — H3322 Serous retinal detachment, left eye: Secondary | ICD-10-CM | POA: Diagnosis not present

## 2022-08-31 DIAGNOSIS — H35033 Hypertensive retinopathy, bilateral: Secondary | ICD-10-CM

## 2022-08-31 DIAGNOSIS — H35373 Puckering of macula, bilateral: Secondary | ICD-10-CM | POA: Diagnosis not present

## 2022-08-31 DIAGNOSIS — H33101 Unspecified retinoschisis, right eye: Secondary | ICD-10-CM

## 2022-08-31 DIAGNOSIS — I10 Essential (primary) hypertension: Secondary | ICD-10-CM

## 2022-08-31 DIAGNOSIS — H353231 Exudative age-related macular degeneration, bilateral, with active choroidal neovascularization: Secondary | ICD-10-CM

## 2022-08-31 DIAGNOSIS — Z961 Presence of intraocular lens: Secondary | ICD-10-CM

## 2022-08-31 MED ORDER — FARICIMAB-SVOA 6 MG/0.05ML IZ SOLN
6.0000 mg | INTRAVITREAL | Status: AC | PRN
  Administered 2022-08-31: 6 mg via INTRAVITREAL

## 2022-09-23 ENCOUNTER — Other Ambulatory Visit (INDEPENDENT_AMBULATORY_CARE_PROVIDER_SITE_OTHER): Payer: Self-pay

## 2022-09-23 MED ORDER — BROMFENAC SODIUM 0.07 % OP SOLN: 1.0000 [drp] | Freq: Two times a day (BID) | OPHTHALMIC | 5 refills | Status: DC

## 2022-09-23 MED ORDER — PREDNISOLONE ACETATE 1 % OP SUSP: 1.0000 [drp] | Freq: Two times a day (BID) | OPHTHALMIC | 5 refills | Status: DC

## 2022-10-12 ENCOUNTER — Encounter (INDEPENDENT_AMBULATORY_CARE_PROVIDER_SITE_OTHER): Payer: Medicare Other | Admitting: Ophthalmology

## 2022-10-12 DIAGNOSIS — H33101 Unspecified retinoschisis, right eye: Secondary | ICD-10-CM

## 2022-10-12 DIAGNOSIS — H35033 Hypertensive retinopathy, bilateral: Secondary | ICD-10-CM

## 2022-10-12 DIAGNOSIS — H3322 Serous retinal detachment, left eye: Secondary | ICD-10-CM

## 2022-10-12 DIAGNOSIS — H3563 Retinal hemorrhage, bilateral: Secondary | ICD-10-CM

## 2022-10-12 DIAGNOSIS — H353231 Exudative age-related macular degeneration, bilateral, with active choroidal neovascularization: Secondary | ICD-10-CM

## 2022-10-12 DIAGNOSIS — Z961 Presence of intraocular lens: Secondary | ICD-10-CM

## 2022-10-12 DIAGNOSIS — I10 Essential (primary) hypertension: Secondary | ICD-10-CM

## 2022-10-12 DIAGNOSIS — H35373 Puckering of macula, bilateral: Secondary | ICD-10-CM

## 2022-10-13 ENCOUNTER — Encounter (INDEPENDENT_AMBULATORY_CARE_PROVIDER_SITE_OTHER): Payer: Self-pay | Admitting: Ophthalmology

## 2022-10-13 ENCOUNTER — Ambulatory Visit (INDEPENDENT_AMBULATORY_CARE_PROVIDER_SITE_OTHER): Payer: Medicare Other | Admitting: Ophthalmology

## 2022-10-13 DIAGNOSIS — H3322 Serous retinal detachment, left eye: Secondary | ICD-10-CM

## 2022-10-13 DIAGNOSIS — I10 Essential (primary) hypertension: Secondary | ICD-10-CM

## 2022-10-13 DIAGNOSIS — Z961 Presence of intraocular lens: Secondary | ICD-10-CM

## 2022-10-13 DIAGNOSIS — H3563 Retinal hemorrhage, bilateral: Secondary | ICD-10-CM | POA: Diagnosis not present

## 2022-10-13 DIAGNOSIS — H33101 Unspecified retinoschisis, right eye: Secondary | ICD-10-CM

## 2022-10-13 DIAGNOSIS — H353231 Exudative age-related macular degeneration, bilateral, with active choroidal neovascularization: Secondary | ICD-10-CM

## 2022-10-13 DIAGNOSIS — H35373 Puckering of macula, bilateral: Secondary | ICD-10-CM

## 2022-10-13 DIAGNOSIS — H35033 Hypertensive retinopathy, bilateral: Secondary | ICD-10-CM

## 2022-10-13 MED ORDER — FARICIMAB-SVOA 6 MG/0.05ML IZ SOSY
6.0000 mg | PREFILLED_SYRINGE | INTRAVITREAL | Status: AC | PRN
Start: 2022-10-13 — End: 2022-10-13
  Administered 2022-10-13: 6 mg via INTRAVITREAL

## 2022-10-13 MED ORDER — AFLIBERCEPT 2MG/0.05ML IZ SOLN FOR KALEIDOSCOPE
2.0000 mg | INTRAVITREAL | Status: AC | PRN
Start: 2022-10-13 — End: 2022-10-13
  Administered 2022-10-13: 2 mg via INTRAVITREAL

## 2022-10-13 NOTE — Progress Notes (Signed)
[facility] - Clinic Note  10/13/2022     CHIEF COMPLAINT Patient presents for Retina Follow Up  HISTORY OF PRESENT ILLNESS: Tracey Rosario is a 89 y.o. female who presents to the clinic today for:   HPI     Retina Follow Up   Patient presents with  Wet AMD.  In both eyes.  This started 6 weeks ago.  Duration of 6 weeks.  Since onset it is stable.  I, the attending physician,  performed the HPI with the patient and updated documentation appropriately.        Comments   6 week retina follow up AMD ou and IVV OS pt is reporting no vision changes noticed she denies any flashes or floaters  pt is using PF and prolensa bid os       Last edited by Rennis Chris, MD on 10/13/2022 10:57 AM.    Pt states she can see okay   Referring physician: Courtney Paris, NP [address] [place],  [place] [address]  HISTORICAL INFORMATION:   Selected notes from the MEDICAL RECORD NUMBER Referred by Dr. Baker Pierini for retinal eval   CURRENT MEDICATIONS: Current Outpatient Medications (Ophthalmic Drugs)  Medication Sig   Bromfenac Sodium (PROLENSA) 0.07 % SOLN Place 1 drop into the left eye in the morning and at bedtime.   polyvinyl alcohol (LIQUIFILM TEARS) 1.4 % ophthalmic solution Place 1 drop into both eyes as needed for dry eyes.   prednisoLONE acetate (PRED FORTE) 1 % ophthalmic suspension Place 1 drop into the left eye in the morning and at bedtime.   No current facility-administered medications for this visit. (Ophthalmic Drugs)   Current Outpatient Medications (Other)  Medication Sig   albuterol (VENTOLIN HFA) 108 (90 Base) MCG/ACT inhaler Inhale 2 puffs into the lungs every 6 (six) hours as needed for wheezing or shortness of breath.   amLODipine (NORVASC) 10 MG tablet Take 10 mg by mouth daily.   Cholecalciferol (VITAMIN D3) 125 MCG (5000 UT) TABS Take 5,000 Units by mouth daily.    citalopram (CELEXA) 20 MG tablet Take 20 mg by mouth daily.    donepezil (ARICEPT) 23 MG TABS tablet Take 23 mg by mouth daily.   ferrous gluconate (FERGON) 324 MG tablet Take 324 mg by mouth daily with breakfast.   furosemide (LASIX) 20 MG tablet Take 1 tablet (20 mg total) by mouth daily.   gabapentin (NEURONTIN) 300 MG capsule Take 300 mg by mouth 3 (three) times daily as needed (for neuropathic pain).   levothyroxine (SYNTHROID) 75 MCG tablet Take 75 mcg by mouth daily before breakfast.   lisinopril (ZESTRIL) 10 MG tablet Take 10 mg by mouth daily.   memantine (NAMENDA) 5 MG tablet TAKE 1 TABLET BY MOUTH EVERYDAY AT BEDTIME (Patient taking differently: Take 5 mg by mouth at bedtime.)   Nystatin (GERHARDT'S BUTT CREAM) CREA Apply 1 Application topically daily.   omeprazole (PRILOSEC) 20 MG capsule TAKE 1 CAPSULE DAILY (Patient taking differently: Take 20 mg by mouth daily before breakfast.)   ondansetron (ZOFRAN) 4 MG tablet Take 1 tablet (4 mg total) by mouth every 6 (six) hours as needed for nausea or vomiting.   oxybutynin (DITROPAN-XL) 10 MG 24 hr tablet Take 10 mg by mouth daily.   oxybutynin (DITROPAN-XL) 5 MG 24 hr tablet Take 5 mg by mouth daily.   Probiotic Product (PROBIOTIC PO) Take 1 capsule by mouth daily after breakfast.   No current facility-administered medications for this  visit. (Other)   REVIEW OF SYSTEMS: ROS   Positive for: Gastrointestinal, Neurological, Genitourinary, Musculoskeletal, Cardiovascular, Eyes Negative for: Constitutional, Skin, HENT, Endocrine, Respiratory, Psychiatric, Allergic/Imm, Heme/Lymph Last edited by Etheleen Mayhew, COT on 10/13/2022  9:03 AM.       ALLERGIES Allergies  Allergen Reactions   Codeine Other (See Comments)    Altered mental status   PAST MEDICAL HISTORY Past Medical History:  Diagnosis Date   Acid reflux    Anemia    Arthropathy    Bradycardia    beta blocker reduced in 2021 due to this   CKD (chronic kidney disease), stage III (HCC)    Cognitive decline    referred to  neurologist   Depression 10/07/2013   daughter didn't know anything about this   Dizziness    Frequent PVCs    GERD (gastroesophageal reflux disease) 05/04/2013   Hip fx (HCC)    r hip, bilat pubis rami   Hyperlipemia    Hypertension    Hypertensive retinopathy    OU   Hypothyroidism    Insomnia    Macular degeneration    Exu OS   Multilevel degenerative disc disease    Palpitations 05/04/2013   saw cardiologist in 2015, he deleted A. Fib from the record as per note not documentation to support   Premature atrial contractions    PSVT (paroxysmal supraventricular tachycardia) (HCC)    a. short runs by monitor in 2021.   Short-term memory loss    Vitamin D deficiency    Past Surgical History:  Procedure Laterality Date   ABDOMINAL HYSTERECTOMY  1971   BLADDER SURGERY  2010   Broken Pelvis  2015   C-EYE SURGERY PROCEDURE Bilateral    zaldivar   CATARACT EXTRACTION Bilateral    Done in [place]   EYE SURGERY Bilateral    Cat Sx   HIP PINNING,CANNULATED Right 10/07/2013   Procedure: CANNULATED screws right hip;  Surgeon: Kathryne Hitch, MD;  Location: [facility];  Service: Orthopedics;  Laterality: Right;   FAMILY HISTORY Family History  Problem Relation Age of Onset   Prostate cancer Father    Heart attack Sister    Cancer Brother    Cancer Sister    Heart attack Sister    Cancer Sister    Stroke Daughter    Dementia Neg Hx    Alzheimer's disease Neg Hx    SOCIAL HISTORY Social History   Tobacco Use   Smoking status: Former    Current packs/day: 0.00    Types: Cigarettes    Quit date: 08/02/1970    Years since quitting: 52.2   Smokeless tobacco: Never   Tobacco comments:    "smoked very little"  Vaping Use   Vaping status: Never Used  Substance Use Topics   Alcohol use: Never    Alcohol/week: 0.0 standard drinks of alcohol   Drug use: Never       OPHTHALMIC EXAM:  Base Eye Exam     Visual Acuity (Snellen - Linear)       Right Left   Dist [place]  20/40 -2 20/60 -3   Dist ph [place] NI NI         Tonometry (Tonopen, 9:13 AM)       Right Left   Pressure 14 12         Pupils       Pupils Dark Light Shape React APD   Right PERRL 2 1 Round Brisk None  Left PERRL 2 1 Round Brisk None         Visual Fields       Left Right    Full Full         Extraocular Movement       Right Left    Full, Ortho Full, Ortho         Neuro/Psych     Oriented x3: Yes   Mood/Affect: Normal         Dilation     Both eyes: 2.5% Phenylephrine @ 9:13 AM           Slit Lamp and Fundus Exam     Slit Lamp Exam       Right Left   Lids/Lashes Dermatochalasis - upper lid Dermatochalasis - upper lid   Conjunctiva/Sclera White and quiet White and quiet   Cornea Arcus, 1+ Punctate epithelial erosions Arcus, 2-3+ Punctate epithelial erosions, EBMD, irregular epi   Anterior Chamber deep and clear deep and clear   Iris Round and moderately dilated Round and moderately dilated to 5mm, mild iridodonesis   Lens PC IOL in good position with open PC, anterior capsular phimosis PC IOL in good position with open PC   Anterior Vitreous Vitreous syneresis, Posterior vitreous detachment Vitreous syneresis; PVD; vitreous condensations         Fundus Exam       Right Left   Disc Mild temporal Pallor, Peripapillary atrophy, Sharp rim, Compact mild tilt, 2+ temporal Pallor, Peripapillary atrophy, Compact, Sharp rim   C/D Ratio 0.1 0.0   Macula Flat, Blunted foveal reflex, mild Epiretinal membrane, drusen, RPE mottling and clumping, No heme, trace cystic changes centrally -- increased Flat, Blunted foveal reflex, RPE mottling and clumping, Epiretinal membrane, trace cystic changes, No heme   Vessels attenuated, Tortuous attenuated, mild tortuosity   Periphery IT schisis cavity from 0700-0830; otherwise attached; stable improvement in [facility] and exudation under schisis cavities [facility] not red and pigmenting in), exudation resolved, focal pigmented  CR scar at 0630; no red heme temporal sub-retinal hemorrhage improved - now pigmented CR atrophy;  No SRF -- completely resolved, shallow schisis IT quad, No heme            IMAGING AND PROCEDURES  Imaging and Procedures for @TODAY @  OCT, Retina - OU - Both Eyes       Right Eye Quality was good. Central Foveal Thickness: 419. Progression has worsened. Findings include no SRF, abnormal foveal contour, retinal drusen , epiretinal membrane, intraretinal fluid, macular pucker, pigment epithelial detachment (Mild progression of ERM with interval increase in central cystic changes, bullous schisis with PEDs within IT quad caught on widefield).   Left Eye Quality was good. Central Foveal Thickness: 299. Progression has been stable. Findings include normal foveal contour, no SRF, abnormal foveal contour, retinal drusen , epiretinal membrane, preretinal fibrosis (Persistent central cystic changes/edema, persistent ERM with PRF).   Notes *Images captured and stored on drive  Diagnosis / Impression:  OD: Mild progression of ERM with interval increase in central cystic changes, bullous schisis with PEDs within IT quad caught on widefield OS: Persistent central cystic changes/edema, persistent ERM with PRF  Clinical management:  See below  Abbreviations: NFP - Normal foveal profile. CME - cystoid macular edema. PED - pigment epithelial detachment. IRF - intraretinal fluid. SRF - subretinal fluid. EZ - ellipsoid zone. ERM - epiretinal membrane. ORA - outer retinal atrophy. ORT - outer retinal tubulation. SRHM - subretinal hyper-reflective material  Intravitreal Injection, Pharmacologic Agent - OD - Right Eye       Time Out 10/13/2022. 9:52 AM. Confirmed correct patient, procedure, site, and patient consented.   Anesthesia Topical anesthesia was used. Anesthetic medications included Lidocaine 2%, Proparacaine 0.5%.   Procedure Preparation included 5% betadine to ocular surface,  eyelid speculum. A (32g) needle was used.   Injection: 2 mg aflibercept 2 MG/0.05ML   Route: Intravitreal, Site: Right Eye   NDC: L6038910, Lot: [id number], Expiration date: 11/03/2023, Waste: 0 mL   Post-op Post injection exam found visual acuity of at least counting fingers. The patient tolerated the procedure well. There were no complications. The patient received written and verbal post procedure care education. Post injection medications were not given.      Intravitreal Injection, Pharmacologic Agent - OS - Left Eye       Time Out 10/13/2022. 9:52 AM. Confirmed correct patient, procedure, site, and patient consented.   Anesthesia Topical anesthesia was used. Anesthetic medications included Lidocaine 2%, Proparacaine 0.5%.   Procedure Preparation included 5% betadine to ocular surface, eyelid speculum. A (32g) needle was used.   Injection: 6 mg faricimab-svoa 6 MG/0.05ML   Route: Intravitreal, Site: Left Eye   NDC: 09811-914-78, Lot: [id number], Expiration date: 04/02/2024, Waste: 0 mL   Post-op Post injection exam found visual acuity of at least counting fingers. The patient tolerated the procedure well. There were no complications. The patient received written and verbal post procedure care education. Post injection medications were not given.            ASSESSMENT/PLAN:    ICD-10-CM   1. Exudative age-related macular degeneration of both eyes with active choroidal neovascularization (HCC)  H35.3231 OCT, Retina - OU - Both Eyes    Intravitreal Injection, Pharmacologic Agent - OD - Right Eye    Intravitreal Injection, Pharmacologic Agent - OS - Left Eye    faricimab-svoa (VABYSMO) 6mg /0.58mL intravitreal injection    aflibercept (EYLEA) SOLN 2 mg    2. Subretinal hemorrhage of both eyes  H35.63     3. Serous retinal detachment of left eye  H33.22     4. 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 Essential  hypertension  I10     7. Hypertensive retinopathy of both eyes  H35.033     8. Pseudophakia of both eyes  Z96.1      1-3. Exudative age related macular degeneration / PECHR OU - interval conversion to exu ARMD OD noted on 11.18.22 exam -- new SRH and exudates IT periphery  - initially presented with symptomatic floaters OS - initial exam showed large peripheral temporal subretinal hemorrhage OS -- spanning 2-430 -- also mild vitreous opacities and condensations that were causing symptomatic floaters  - B-scan (9.18.20) showed hyperreflective, ill-defined mass, 0300 periphery OS - was seen by Dr. Pearletha Furl, Ocular Oncologist, on 9.22.2020 -- no tumor on exam or b-scan  - s/p IVA OS #1 (08.05.20) for subretinal hemorrhage, peripheral CNV - s/p IVA OD #1 (11.18.22), #2 (12.16.22), #3 (01.13.23), #4 (02.10.23), #5 (03.24.23) - s/p IVE OD #1 (05.08.23), #2 (06.21.23), #3 (08.16.23), #4 (02.27.24), #5 (06.05.24) - s/p IVE OS #1 (09.02.20) - sample, #2 (10.12.20), #3 (11.11.20), #4 (12.09.20), #5 (1.20.21), #6 (02.17.21), #7 (03.19.21), #8 (04.30.21), #9 (06.16.21), #10 (08.11.21), #11 (12.01.21), #12 (01.26.22), #13 (03.30.22), #14 (06.01.22), #15 (08.10.22), #16 (09.23.22), #17 (11.18.22), #18 (02.10.23), #19 (03.24.23), #20 (05.08.23), #21 (06.21.23), #22 (08.16.23), #  23 (10.25.23), #24 (12.20.23), #25 (02.27.24)  - s/p IVV OS #1 (04.24.24), #2 (06.05.24), #3 (07.17.24), #4 (08.28.24)  - today BCVA OD 20/40; OS 20/60 -- decreased OU  - exam shows improved peripheral SRH OU - OCT shows OD: Mild progression of ERM with interval increase in central cystic changes, bullous schisis with PEDs within IT quad caught on widefield; OS: Persistent central cystic changes/edema, persistent ERM with PRF at 6 weeks  - okay to stop Prolensa and PF BID OS for possible CME component -- but replace with AT's  - recommend IVE OD #26 and IVV OS #5 today, 10.10.24 w/ f/u in 6 wks   - treating OD prn   - pt in agreement  and wishes to proceed with injections OU  - RBA of procedure discussed, questions answered   - see procedure note  - IVV informed consent obtained and signed 04.24.24 (OS)  - [id number] Benefits investigation initiated 09.02.2020 -- approved for 2024 - f/u 6 weeks, sooner prn, OCT/DFE/, possible injections  4. Epiretinal membrane, OU.  - mild ERM OU  - asymptomatic, no metamorphopsia  - no indication for surgery at this time  - continue to monitor  5. Retinoschisis OD -- stable - schisis cavity, IT quadrant, from 0700-0830, -- ?less posterior extension ?collapsing  - no associated RT/RD but now with [facility] and exudates -- improving   - discussed findings and prognosis  - continue to monitor  6,7. Hypertensive retinopathy OU  - discussed importance of tight BP control  - continue to monitor  8. Pseudophakia OU  - s/p CE/IOL OU  - doing well  - continue to monitor  Ophthalmic Meds Ordered this visit:  Meds ordered this encounter  Medications   faricimab-svoa (VABYSMO) 6mg /0.58mL intravitreal injection   aflibercept (EYLEA) SOLN 2 mg     Return in about 6 weeks (around 11/24/2022) for f/u exu ARMD OU, DFE, OCT.  There are no Patient Instructions on file for this visit.   Explained the diagnoses, plan, and follow up with the patient and they expressed understanding.  Patient expressed understanding of the importance of proper follow up care.  This document serves as a record of services personally performed by Karie Chimera, MD, PhD. It was created on their behalf by Glee Arvin. Manson Passey, OA an ophthalmic technician. The creation of this record is the provider's dictation and/or activities during the visit.    Electronically signed by: Glee Arvin. Manson Passey, OA 10/13/22 11:00 AM  Karie Chimera, M.D., Ph.D. Diseases & Surgery of the Retina and Vitreous [facility]  I have reviewed the above documentation for accuracy and completeness, and I agree with the above.  Karie Chimera, M.D., Ph.D. 10/13/22 11:01 AM  Abbreviations: M myopia (nearsighted); A astigmatism; H hyperopia (farsighted); P presbyopia; Mrx spectacle prescription;  CTL contact lenses; OD right eye; OS left eye; OU both eyes  XT exotropia; ET esotropia; PEK punctate epithelial keratitis; PEE punctate epithelial erosions; DES dry eye syndrome; MGD meibomian gland dysfunction; ATs artificial tears; PFAT's preservative free artificial tears; NSC nuclear sclerotic cataract; PSC posterior subcapsular cataract; ERM epi-retinal membrane; PVD posterior vitreous detachment; RD retinal detachment; DM diabetes mellitus; DR diabetic retinopathy; NPDR non-proliferative diabetic retinopathy; PDR proliferative diabetic retinopathy; CSME clinically significant macular edema; DME diabetic macular edema; dbh dot blot hemorrhages; CWS cotton wool spot; POAG primary open angle glaucoma; C/D cup-to-disc ratio; HVF humphrey visual field; GVF goldmann visual field; OCT optical coherence tomography; IOP intraocular pressure;  BRVO Branch retinal vein occlusion; CRVO central retinal vein occlusion; CRAO central retinal artery occlusion; BRAO branch retinal artery occlusion; RT retinal tear; SB scleral buckle; PPV pars plana vitrectomy; VH Vitreous hemorrhage; PRP panretinal laser photocoagulation; IVK intravitreal kenalog; VMT vitreomacular traction; MH Macular hole;  NVD neovascularization of the disc; NVE neovascularization elsewhere; AREDS age related eye disease study; ARMD age related macular degeneration; POAG primary open angle glaucoma; EBMD epithelial/anterior basement membrane dystrophy; ACIOL anterior chamber intraocular lens; IOL intraocular lens; PCIOL posterior chamber intraocular lens; Phaco/IOL phacoemulsification with intraocular lens placement; PRK photorefractive keratectomy; LASIK laser assisted in situ keratomileusis; HTN hypertension; DM diabetes mellitus; COPD chronic obstructive pulmonary disease

## 2022-11-15 NOTE — Progress Notes (Signed)
[facility] - Clinic Note  11/23/2022     CHIEF COMPLAINT Patient presents for Retina Follow Up  HISTORY OF PRESENT ILLNESS: Tracey Rosario is a 89 y.o. female who presents to the clinic today for:   HPI     Retina Follow Up   Patient presents with  Wet AMD.  In both eyes.  This started 6 weeks ago.  I, the attending physician,  performed the HPI with the patient and updated documentation appropriately.        Comments   Patient here for 6 weeks retina follow up for exu ARMD OU. Patient states vision sees enough. No eye pain.       Last edited by Rennis Chris, MD on 11/23/2022 12:25 PM.       Referring physician: Courtney Paris, NP [address] [place],  [place] [address]  HISTORICAL INFORMATION:   Selected notes from the MEDICAL RECORD NUMBER Referred by Dr. Baker Pierini for retinal eval   CURRENT MEDICATIONS: Current Outpatient Medications (Ophthalmic Drugs)  Medication Sig   Bromfenac Sodium (PROLENSA) 0.07 % SOLN Place 1 drop into the left eye in the morning and at bedtime.   polyvinyl alcohol (LIQUIFILM TEARS) 1.4 % ophthalmic solution Place 1 drop into both eyes as needed for dry eyes.   prednisoLONE acetate (PRED FORTE) 1 % ophthalmic suspension Place 1 drop into the left eye in the morning and at bedtime.   No current facility-administered medications for this visit. (Ophthalmic Drugs)   Current Outpatient Medications (Other)  Medication Sig   albuterol (VENTOLIN HFA) 108 (90 Base) MCG/ACT inhaler Inhale 2 puffs into the lungs every 6 (six) hours as needed for wheezing or shortness of breath.   amLODipine (NORVASC) 10 MG tablet Take 10 mg by mouth daily.   Cholecalciferol (VITAMIN D3) 125 MCG (5000 UT) TABS Take 5,000 Units by mouth daily.    citalopram (CELEXA) 20 MG tablet Take 20 mg by mouth daily.   donepezil (ARICEPT) 23 MG TABS tablet Take 23 mg by mouth daily.   ferrous gluconate (FERGON) 324 MG tablet Take 324 mg by  mouth daily with breakfast.   furosemide (LASIX) 20 MG tablet Take 1 tablet (20 mg total) by mouth daily.   gabapentin (NEURONTIN) 300 MG capsule Take 300 mg by mouth 3 (three) times daily as needed (for neuropathic pain).   levothyroxine (SYNTHROID) 75 MCG tablet Take 75 mcg by mouth daily before breakfast.   lisinopril (ZESTRIL) 10 MG tablet Take 10 mg by mouth daily.   memantine (NAMENDA) 5 MG tablet TAKE 1 TABLET BY MOUTH EVERYDAY AT BEDTIME (Patient taking differently: Take 5 mg by mouth at bedtime.)   Nystatin (GERHARDT'S BUTT CREAM) CREA Apply 1 Application topically daily.   omeprazole (PRILOSEC) 20 MG capsule TAKE 1 CAPSULE DAILY (Patient taking differently: Take 20 mg by mouth daily before breakfast.)   ondansetron (ZOFRAN) 4 MG tablet Take 1 tablet (4 mg total) by mouth every 6 (six) hours as needed for nausea or vomiting.   oxybutynin (DITROPAN-XL) 10 MG 24 hr tablet Take 10 mg by mouth daily.   oxybutynin (DITROPAN-XL) 5 MG 24 hr tablet Take 5 mg by mouth daily.   Probiotic Product (PROBIOTIC PO) Take 1 capsule by mouth daily after breakfast.   No current facility-administered medications for this visit. (Other)   REVIEW OF SYSTEMS: ROS   Positive for: Gastrointestinal, Neurological, Genitourinary, Musculoskeletal, Cardiovascular, Eyes Negative for: Constitutional, Skin, HENT, Endocrine, Respiratory, Psychiatric, Allergic/Imm, Heme/Lymph  Last edited by Laddie Aquas, COA on 11/23/2022  9:03 AM.        ALLERGIES Allergies  Allergen Reactions   Codeine Other (See Comments)    Altered mental status   PAST MEDICAL HISTORY Past Medical History:  Diagnosis Date   Acid reflux    Anemia    Arthropathy    Bradycardia    beta blocker reduced in 2021 due to this   CKD (chronic kidney disease), stage III (HCC)    Cognitive decline    referred to neurologist   Depression 10/07/2013   daughter didn't know anything about this   Dizziness    Frequent PVCs    GERD  (gastroesophageal reflux disease) 05/04/2013   Hip fx (HCC)    r hip, bilat pubis rami   Hyperlipemia    Hypertension    Hypertensive retinopathy    OU   Hypothyroidism    Insomnia    Macular degeneration    Exu OS   Multilevel degenerative disc disease    Palpitations 05/04/2013   saw cardiologist in 2015, he deleted A. Fib from the record as per note not documentation to support   Premature atrial contractions    PSVT (paroxysmal supraventricular tachycardia) (HCC)    a. short runs by monitor in 2021.   Short-term memory loss    Vitamin D deficiency    Past Surgical History:  Procedure Laterality Date   ABDOMINAL HYSTERECTOMY  1971   BLADDER SURGERY  2010   Broken Pelvis  2015   C-EYE SURGERY PROCEDURE Bilateral    zaldivar   CATARACT EXTRACTION Bilateral    Done in [place]   EYE SURGERY Bilateral    Cat Sx   HIP PINNING,CANNULATED Right 10/07/2013   Procedure: CANNULATED screws right hip;  Surgeon: Kathryne Hitch, MD;  Location: [facility] OR;  Service: Orthopedics;  Laterality: Right;   FAMILY HISTORY Family History  Problem Relation Age of Onset   Prostate cancer Father    Heart attack Sister    Cancer Brother    Cancer Sister    Heart attack Sister    Cancer Sister    Stroke Daughter    Dementia Neg Hx    Alzheimer's disease Neg Hx    SOCIAL HISTORY Social History   Tobacco Use   Smoking status: Former    Current packs/day: 0.00    Types: Cigarettes    Quit date: 08/02/1970    Years since quitting: 52.3   Smokeless tobacco: Never   Tobacco comments:    "smoked very little"  Vaping Use   Vaping status: Never Used  Substance Use Topics   Alcohol use: Never    Alcohol/week: 0.0 standard drinks of alcohol   Drug use: Never       OPHTHALMIC EXAM:  Base Eye Exam     Visual Acuity (Snellen - Linear)       Right Left   Dist Marcus 20/40 -1 20/60   Dist ph Carlyss NI NI         Tonometry (Tonopen, 9:01 AM)       Right Left   Pressure 09 10          Pupils       Dark Light Shape React APD   Right 2 1 Round Brisk None   Left 2 1 Round Brisk None         Visual Fields (Counting fingers)       Left Right    Full  Full         Extraocular Movement       Right Left    Full, Ortho Full, Ortho         Neuro/Psych     Oriented x3: Yes   Mood/Affect: Normal         Dilation     Both eyes: 1.0% Mydriacyl, 2.5% Phenylephrine @ 9:01 AM           Slit Lamp and Fundus Exam     Slit Lamp Exam       Right Left   Lids/Lashes Dermatochalasis - upper lid Dermatochalasis - upper lid   Conjunctiva/Sclera White and quiet White and quiet   Cornea Arcus, 1+ Punctate epithelial erosions Arcus, 2-3+ Punctate epithelial erosions, EBMD, irregular epi   Anterior Chamber deep and clear deep and clear   Iris Round and moderately dilated Round and moderately dilated to 5mm, mild iridodonesis   Lens PC IOL in good position with open PC, anterior capsular phimosis PC IOL in good position with open PC   Anterior Vitreous Vitreous syneresis, Posterior vitreous detachment Vitreous syneresis; PVD; vitreous condensations         Fundus Exam       Right Left   Disc Mild temporal Pallor, Peripapillary atrophy, Sharp rim, Compact mild tilt, 2+ temporal Pallor, Peripapillary atrophy, Compact, Sharp rim   C/D Ratio 0.1 0.0   Macula Flat, Blunted foveal reflex, mild Epiretinal membrane, drusen, RPE mottling and clumping, No heme, trace cystic changes centrally -- improved Flat, Blunted foveal reflex, RPE mottling and clumping, Epiretinal membrane, trace cystic changes, No heme   Vessels attenuated, Tortuous attenuated, mild tortuosity   Periphery IT schisis cavity from 0700-0830 -- improved; otherwise attached; stable improvement in [facility] and exudation under schisis cavities [facility] not red and pigmenting in), exudation resolved, focal pigmented CR scar at 0630; no red heme temporal sub-retinal hemorrhage improved - now pigmented CR atrophy;   No SRF -- completely resolved, shallow schisis IT quad, No heme            IMAGING AND PROCEDURES  Imaging and Procedures for @TODAY @  OCT, Retina - OU - Both Eyes       Right Eye Quality was good. Central Foveal Thickness: 337. Progression has improved. Findings include normal foveal contour, no IRF, no SRF, retinal drusen , epiretinal membrane, macular pucker, pigment epithelial detachment (Persistent ERM with interval improvement in central cystic changes and foveal contour, bullous schisis with PEDs within IT quad caught on widefield -- not imaged today).   Left Eye Quality was good. Central Foveal Thickness: 297. Progression has been stable. Findings include normal foveal contour, no SRF, abnormal foveal contour, retinal drusen , epiretinal membrane, preretinal fibrosis (Persistent central cystic changes -- trace, persistent ERM with PRF).   Notes *Images captured and stored on drive  Diagnosis / Impression:  OD: Persistent ERM with interval improvement in central cystic changes and foveal contour, bullous schisis with PEDs within IT quad caught on widefield -- not imaged today OS: Persistent central cystic changes -- trace, persistent ERM with PRF  Clinical management:  See below  Abbreviations: NFP - Normal foveal profile. CME - cystoid macular edema. PED - pigment epithelial detachment. IRF - intraretinal fluid. SRF - subretinal fluid. EZ - ellipsoid zone. ERM - epiretinal membrane. ORA - outer retinal atrophy. ORT - outer retinal tubulation. SRHM - subretinal hyper-reflective material      Intravitreal Injection, Pharmacologic Agent - OS - Left Eye  Time Out 11/23/2022. 9:53 AM. Confirmed correct patient, procedure, site, and patient consented.   Anesthesia Topical anesthesia was used. Anesthetic medications included Lidocaine 2%, Proparacaine 0.5%.   Procedure Preparation included 5% betadine to ocular surface, eyelid speculum. A (32g) needle was used.    Injection: 6 mg faricimab-svoa 6 MG/0.05ML   Route: Intravitreal, Site: Left Eye   NDC: 16109-604-54, Lot: [id number], Expiration date: 11/03/2023, Waste: 0 mL   Post-op Post injection exam found visual acuity of at least counting fingers. The patient tolerated the procedure well. There were no complications. The patient received written and verbal post procedure care education. Post injection medications were not given.            ASSESSMENT/PLAN:    ICD-10-CM   1. Exudative age-related macular degeneration of both eyes with active choroidal neovascularization (HCC)  H35.3231 OCT, Retina - OU - Both Eyes    Intravitreal Injection, Pharmacologic Agent - OS - Left Eye    faricimab-svoa (VABYSMO) 6mg /0.75mL intravitreal injection    CANCELED: Intravitreal Injection, Pharmacologic Agent - OD - Right Eye    2. Subretinal hemorrhage of both eyes  H35.63     3. Serous retinal detachment of left eye  H33.22     4. 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 Essential hypertension  I10     7. Hypertensive retinopathy of both eyes  H35.033     8. Pseudophakia of both eyes  Z96.1      1-3. Exudative age related macular degeneration / PECHR OU - interval conversion to exu ARMD OD noted on 11.18.22 exam -- new SRH and exudates IT periphery  - initially presented with symptomatic floaters OS - initial exam showed large peripheral temporal subretinal hemorrhage OS -- spanning 2-430 -- also mild vitreous opacities and condensations that were causing symptomatic floaters  - B-scan (9.18.20) showed hyperreflective, ill-defined mass, 0300 periphery OS - was seen by Dr. Pearletha Furl, Ocular Oncologist, on 9.22.2020 -- no tumor on exam or b-scan  - s/p IVA OS #1 (08.05.20) for subretinal hemorrhage, peripheral CNV - s/p IVA OD #1 (11.18.22), #2 (12.16.22), #3 (01.13.23), #4 (02.10.23), #5 (03.24.23) - s/p IVE OD #1 (05.08.23), #2 (06.21.23), #3  (08.16.23), #4 (02.27.24), #5 (06.05.24), #6 (10.10.24) - s/p IVE OS #1 (09.02.20) - sample, #2 (10.12.20), #3 (11.11.20), #4 (12.09.20), #5 (1.20.21), #6 (02.17.21), #7 (03.19.21), #8 (04.30.21), #9 (06.16.21), #10 (08.11.21), #11 (12.01.21), #12 (01.26.22), #13 (03.30.22), #14 (06.01.22), #15 (08.10.22), #16 (09.23.22), #17 (11.18.22), #18 (02.10.23), #19 (03.24.23), #20 (05.08.23), #21 (06.21.23), #22 (08.16.23), #23 (10.25.23), #24 (12.20.23), #25 (02.27.24)  - s/p IVV OS #1 (04.24.24), #2 (06.05.24), #3 (07.17.24), #4 (08.28.24), #5 (10.10.24)  - today BCVA OD 20/40; OS 20/60 -- stable OU  - exam shows peripheral SRH stably improved OU - OCT shows OD: Persistent ERM with interval improvement in central cystic changes and foveal contour, bullous schisis with PEDs within IT quad caught on widefield -- not imaged today; OS: Persistent central cystic changes -- trace, persistent ERM with PRF at 6 weeks  - okay to stop Prolensa and PF BID OS for possible CME component -- but replace with AT's  - recommend IVV OS #6 today, 11.20.24 w/ f/u in 7 wks   - will hold tx OD -- treating OD prn (last injxns 06.05.24 and 10.10.24 -- ~4 mo interval)  - pt in agreement and wishes to proceed with injection OS  -  RBA of procedure discussed, questions answered   - see procedure note  - IVV informed consent obtained and signed 04.24.24 (OS)  - [id number] Benefits investigation initiated 09.02.2020 -- approved for 2024 - f/u 7 weeks, sooner prn, OCT -- scan through IT schisis OD, DFE, possible injections  4. Epiretinal membrane, OU.  - mild ERM OU  - asymptomatic, no metamorphopsia  - no indication for surgery at this time  - continue to monitor  5. Retinoschisis OD -- stable - schisis cavity, IT quadrant, from 0700-0830, -- ?less posterior extension ?collapsing  - no associated RT/RD but now with [facility] and exudates -- improving   - discussed findings and prognosis  - continue to monitor  6,7. Hypertensive  retinopathy OU  - discussed importance of tight BP control  - continue to monitor  8. Pseudophakia OU  - s/p CE/IOL OU  - doing well  - continue to monitor  Ophthalmic Meds Ordered this visit:  Meds ordered this encounter  Medications   faricimab-svoa (VABYSMO) 6mg /0.74mL intravitreal injection     Return in about 7 weeks (around 01/11/2023) for f/u exu ARMD OU, DFE, OCT.  There are no Patient Instructions on file for this visit.   Explained the diagnoses, plan, and follow up with the patient and they expressed understanding.  Patient expressed understanding of the importance of proper follow up care.  This document serves as a record of services personally performed by Karie Chimera, MD, PhD. It was created on their behalf by De Blanch, an ophthalmic technician. The creation of this record is the provider's dictation and/or activities during the visit.    Electronically signed by: De Blanch, OA, 11/23/22  12:27 PM  This document serves as a record of services personally performed by Karie Chimera, MD, PhD. It was created on their behalf by Glee Arvin. Manson Passey, OA an ophthalmic technician. The creation of this record is the provider's dictation and/or activities during the visit.    Electronically signed by: Glee Arvin. Manson Passey, OA 11/23/22 12:27 PM  Karie Chimera, M.D., Ph.D. Diseases & Surgery of the Retina and Vitreous [facility]  I have reviewed the above documentation for accuracy and completeness, and I agree with the above. Karie Chimera, M.D., Ph.D. 11/23/22 12:32 PM  Abbreviations: M myopia (nearsighted); A astigmatism; H hyperopia (farsighted); P presbyopia; Mrx spectacle prescription;  CTL contact lenses; OD right eye; OS left eye; OU both eyes  XT exotropia; ET esotropia; PEK punctate epithelial keratitis; PEE punctate epithelial erosions; DES dry eye syndrome; MGD meibomian gland dysfunction; ATs artificial tears; PFAT's  preservative free artificial tears; NSC nuclear sclerotic cataract; PSC posterior subcapsular cataract; ERM epi-retinal membrane; PVD posterior vitreous detachment; RD retinal detachment; DM diabetes mellitus; DR diabetic retinopathy; NPDR non-proliferative diabetic retinopathy; PDR proliferative diabetic retinopathy; CSME clinically significant macular edema; DME diabetic macular edema; dbh dot blot hemorrhages; CWS cotton wool spot; POAG primary open angle glaucoma; C/D cup-to-disc ratio; HVF humphrey visual field; GVF goldmann visual field; OCT optical coherence tomography; IOP intraocular pressure; BRVO Branch retinal vein occlusion; CRVO central retinal vein occlusion; CRAO central retinal artery occlusion; BRAO branch retinal artery occlusion; RT retinal tear; SB scleral buckle; PPV pars plana vitrectomy; VH Vitreous hemorrhage; PRP panretinal laser photocoagulation; IVK intravitreal kenalog; VMT vitreomacular traction; MH Macular hole;  NVD neovascularization of the disc; NVE neovascularization elsewhere; AREDS age related eye disease study; ARMD age related macular degeneration; POAG primary open angle glaucoma; EBMD epithelial/anterior basement membrane dystrophy;  ACIOL anterior chamber intraocular lens; IOL intraocular lens; PCIOL posterior chamber intraocular lens; Phaco/IOL phacoemulsification with intraocular lens placement; PRK photorefractive keratectomy; LASIK laser assisted in situ keratomileusis; HTN hypertension; DM diabetes mellitus; COPD chronic obstructive pulmonary disease

## 2022-11-23 ENCOUNTER — Encounter (INDEPENDENT_AMBULATORY_CARE_PROVIDER_SITE_OTHER): Payer: Self-pay | Admitting: Ophthalmology

## 2022-11-23 ENCOUNTER — Ambulatory Visit (INDEPENDENT_AMBULATORY_CARE_PROVIDER_SITE_OTHER): Payer: Medicare Other | Admitting: Ophthalmology

## 2022-11-23 DIAGNOSIS — I10 Essential (primary) hypertension: Secondary | ICD-10-CM | POA: Diagnosis not present

## 2022-11-23 DIAGNOSIS — H35033 Hypertensive retinopathy, bilateral: Secondary | ICD-10-CM | POA: Diagnosis not present

## 2022-11-23 DIAGNOSIS — H35373 Puckering of macula, bilateral: Secondary | ICD-10-CM

## 2022-11-23 DIAGNOSIS — H43813 Vitreous degeneration, bilateral: Secondary | ICD-10-CM

## 2022-11-23 DIAGNOSIS — H353231 Exudative age-related macular degeneration, bilateral, with active choroidal neovascularization: Secondary | ICD-10-CM | POA: Diagnosis not present

## 2022-11-23 DIAGNOSIS — H3563 Retinal hemorrhage, bilateral: Secondary | ICD-10-CM | POA: Diagnosis not present

## 2022-11-23 DIAGNOSIS — H3322 Serous retinal detachment, left eye: Secondary | ICD-10-CM | POA: Diagnosis not present

## 2022-11-23 DIAGNOSIS — H33101 Unspecified retinoschisis, right eye: Secondary | ICD-10-CM

## 2022-11-23 DIAGNOSIS — Z961 Presence of intraocular lens: Secondary | ICD-10-CM

## 2022-11-23 MED ORDER — FARICIMAB-SVOA 6 MG/0.05ML IZ SOSY
6.0000 mg | PREFILLED_SYRINGE | INTRAVITREAL | Status: AC | PRN
Start: 2022-11-23 — End: 2022-11-23
  Administered 2022-11-23: 6 mg via INTRAVITREAL

## 2023-01-09 NOTE — Progress Notes (Signed)
 [facility] - Clinic Note  01/11/2023     CHIEF COMPLAINT Patient presents for Retina Follow Up  HISTORY OF PRESENT ILLNESS: Tracey Rosario is a 89 y.o. female who presents to the clinic today for:   HPI     Retina Follow Up   Patient presents with  Wet AMD.  In both eyes.  This started 7 weeks ago.  Duration of 7 weeks.  I, the attending physician,  performed the HPI with the patient and updated documentation appropriately.        Comments   Patient feels the vision is the same. She is using AT's.       Last edited by Valdemar Rogue, MD on 01/11/2023 12:16 PM.     Pt states vision is stable   Referring physician: Leron Millman, NP [address] [place],  [place] [address]  HISTORICAL INFORMATION:   Selected notes from the MEDICAL RECORD NUMBER Referred by Dr. Medford Ferrier for retinal eval   CURRENT MEDICATIONS: Current Outpatient Medications (Ophthalmic Drugs)  Medication Sig   Bromfenac  Sodium (PROLENSA ) 0.07 % SOLN Place 1 drop into the left eye in the morning and at bedtime.   polyvinyl alcohol  (LIQUIFILM TEARS) 1.4 % ophthalmic solution Place 1 drop into both eyes as needed for dry eyes.   prednisoLONE  acetate (PRED FORTE ) 1 % ophthalmic suspension Place 1 drop into the left eye in the morning and at bedtime.   No current facility-administered medications for this visit. (Ophthalmic Drugs)   Current Outpatient Medications (Other)  Medication Sig   albuterol  (VENTOLIN  HFA) 108 (90 Base) MCG/ACT inhaler Inhale 2 puffs into the lungs every 6 (six) hours as needed for wheezing or shortness of breath.   amLODipine  (NORVASC ) 10 MG tablet Take 10 mg by mouth daily.   Cholecalciferol  (VITAMIN D3) 125 MCG (5000 UT) TABS Take 5,000 Units by mouth daily.    citalopram  (CELEXA ) 20 MG tablet Take 20 mg by mouth daily.   donepezil  (ARICEPT ) 23 MG TABS tablet Take 23 mg by mouth daily.   ferrous gluconate  (FERGON) 324 MG tablet Take 324 mg by  mouth daily with breakfast.   furosemide  (LASIX ) 20 MG tablet Take 1 tablet (20 mg total) by mouth daily.   gabapentin  (NEURONTIN ) 300 MG capsule Take 300 mg by mouth 3 (three) times daily as needed (for neuropathic pain).   levothyroxine  (SYNTHROID ) 75 MCG tablet Take 75 mcg by mouth daily before breakfast.   lisinopril  (ZESTRIL ) 10 MG tablet Take 10 mg by mouth daily.   memantine  (NAMENDA ) 5 MG tablet TAKE 1 TABLET BY MOUTH EVERYDAY AT BEDTIME (Patient taking differently: Take 5 mg by mouth at bedtime.)   Nystatin (GERHARDT'S BUTT CREAM) CREA Apply 1 Application topically daily.   omeprazole  (PRILOSEC) 20 MG capsule TAKE 1 CAPSULE DAILY (Patient taking differently: Take 20 mg by mouth daily before breakfast.)   ondansetron  (ZOFRAN ) 4 MG tablet Take 1 tablet (4 mg total) by mouth every 6 (six) hours as needed for nausea or vomiting.   oxybutynin  (DITROPAN -XL) 10 MG 24 hr tablet Take 10 mg by mouth daily.   oxybutynin  (DITROPAN -XL) 5 MG 24 hr tablet Take 5 mg by mouth daily.   Probiotic Product (PROBIOTIC PO) Take 1 capsule by mouth daily after breakfast.   No current facility-administered medications for this visit. (Other)   REVIEW OF SYSTEMS: ROS   Positive for: Gastrointestinal, Neurological, Genitourinary, Musculoskeletal, Cardiovascular, Eyes Negative for: Constitutional, Skin, HENT, Endocrine, Respiratory, Psychiatric, Allergic/Imm,  Heme/Lymph Last edited by Myra Wanda SAILOR, COT on 01/11/2023  8:45 AM.     ALLERGIES Allergies  Allergen Reactions   Codeine Other (See Comments)    Altered mental status   PAST MEDICAL HISTORY Past Medical History:  Diagnosis Date   Acid reflux    Anemia    Arthropathy    Bradycardia    beta blocker reduced in 2021 due to this   CKD (chronic kidney disease), stage III (HCC)    Cognitive decline    referred to neurologist   Depression 10/07/2013   daughter didn't know anything about this   Dizziness    Frequent PVCs    GERD  (gastroesophageal reflux disease) 05/04/2013   Hip fx (HCC)    r hip, bilat pubis rami   Hyperlipemia    Hypertension    Hypertensive retinopathy    OU   Hypothyroidism    Insomnia    Macular degeneration    Exu OS   Multilevel degenerative disc disease    Palpitations 05/04/2013   saw cardiologist in 2015, he deleted A. Fib from the record as per note not documentation to support   Premature atrial contractions    PSVT (paroxysmal supraventricular tachycardia) (HCC)    a. short runs by monitor in 2021.   Short-term memory loss    Vitamin D  deficiency    Past Surgical History:  Procedure Laterality Date   ABDOMINAL HYSTERECTOMY  1971   BLADDER SURGERY  2010   Broken Pelvis  2015   C-EYE SURGERY PROCEDURE Bilateral    zaldivar   CATARACT EXTRACTION Bilateral    Done in [place]    EYE SURGERY Bilateral    Cat Sx   HIP PINNING,CANNULATED Right 10/07/2013   Procedure: CANNULATED screws right hip;  Surgeon: Lonni CINDERELLA Poli, MD;  Location: [facility] OR;  Service: Orthopedics;  Laterality: Right;   FAMILY HISTORY Family History  Problem Relation Age of Onset   Prostate cancer Father    Heart attack Sister    Cancer Brother    Cancer Sister    Heart attack Sister    Cancer Sister    Stroke Daughter    Dementia Neg Hx    Alzheimer's disease Neg Hx    SOCIAL HISTORY Social History   Tobacco Use   Smoking status: Former    Current packs/day: 0.00    Types: Cigarettes    Quit date: 08/02/1970    Years since quitting: 52.4   Smokeless tobacco: Never   Tobacco comments:    smoked very little  Vaping Use   Vaping status: Never Used  Substance Use Topics   Alcohol  use: Never    Alcohol /week: 0.0 standard drinks of alcohol    Drug use: Never       OPHTHALMIC EXAM:  Base Eye Exam     Visual Acuity (Snellen - Linear)       Right Left   Dist [place] 20/40 20/60   Dist ph [place] NI NI         Tonometry (Tonopen, 8:48 AM)       Right Left   Pressure 11 11          Pupils       Dark Light Shape React APD   Right 2 1 Round Brisk None   Left 2 1 Round Brisk None         Visual Fields       Left Right    Full Full  Extraocular Movement       Right Left    Full, Ortho Full, Ortho         Neuro/Psych     Oriented x3: Yes   Mood/Affect: Normal         Dilation     Both eyes: 1.0% Mydriacyl, 2.5% Phenylephrine @ 8:46 AM           Slit Lamp and Fundus Exam     Slit Lamp Exam       Right Left   Lids/Lashes Dermatochalasis - upper lid Dermatochalasis - upper lid   Conjunctiva/Sclera White and quiet White and quiet   Cornea Arcus, 1+ Punctate epithelial erosions Arcus, 2-3+ Punctate epithelial erosions, EBMD, irregular epi   Anterior Chamber deep and clear deep and clear   Iris Round and moderately dilated Round and moderately dilated to 5mm, mild iridodonesis   Lens PC IOL in good position with open PC, anterior capsular phimosis PC IOL in good position with open PC   Anterior Vitreous Vitreous syneresis, Posterior vitreous detachment Vitreous syneresis; PVD; vitreous condensations         Fundus Exam       Right Left   Disc Mild temporal Pallor, Peripapillary atrophy, Sharp rim, Compact mild tilt, 2+ temporal Pallor, Peripapillary atrophy, Compact, Sharp rim   C/D Ratio 0.1 0.0   Macula Flat, Blunted foveal reflex, mild Epiretinal membrane, drusen, RPE mottling and clumping, No heme, trace cystic changes centrally Flat, Blunted foveal reflex, RPE mottling and clumping, Epiretinal membrane, trace cystic changes, No heme   Vessels attenuated, Tortuous attenuated, Tortuous   Periphery IT schisis cavity from 0700-0830 -- improved; otherwise attached; stable improvement in [facility] and exudation under schisis cavities [facility] not red and pigmenting in), exudation resolved, focal pigmented CR scar at 0630; no red heme temporal sub-retinal hemorrhage improved - now pigmented CR atrophy;  No SRF -- completely resolved, shallow  schisis IT quad, No heme            IMAGING AND PROCEDURES  Imaging and Procedures for @TODAY @  OCT, Retina - OU - Both Eyes       Right Eye Quality was good. Central Foveal Thickness: 361. Progression has worsened. Findings include normal foveal contour, no IRF, no SRF, retinal drusen , epiretinal membrane, macular pucker, pigment epithelial detachment (Persistent ERM with interval increase in central cystic changes, bullous schisis with PEDs within IT quad caught on widefield -- not imaged today).   Left Eye Quality was good. Central Foveal Thickness: 303. Progression has been stable. Findings include normal foveal contour, no SRF, abnormal foveal contour, retinal drusen , epiretinal membrane, preretinal fibrosis (Persistent central cystic changes -- trace, persistent ERM with PRF).   Notes *Images captured and stored on drive  Diagnosis / Impression:  OD: Persistent ERM with interval increase in central cystic changes, bullous schisis with PEDs within IT quad caught on widefield -- not imaged today OS: Persistent central cystic changes -- trace, persistent ERM with PRF  Clinical management:  See below  Abbreviations: NFP - Normal foveal profile. CME - cystoid macular edema. PED - pigment epithelial detachment. IRF - intraretinal fluid. SRF - subretinal fluid. EZ - ellipsoid zone. ERM - epiretinal membrane. ORA - outer retinal atrophy. ORT - outer retinal tubulation. SRHM - subretinal hyper-reflective material      Intravitreal Injection, Pharmacologic Agent - OD - Right Eye       Time Out 01/11/2023. 9:56 AM. Confirmed correct patient, procedure, site, and patient consented.  Anesthesia Topical anesthesia was used. Anesthetic medications included Lidocaine  2%, Proparacaine 0.5%.   Procedure Preparation included 5% betadine to ocular surface, eyelid speculum. A (32g) needle was used.   Injection: 2 mg aflibercept  2 MG/0.05ML   Route: Intravitreal, Site: Right Eye    NDC: D2246706, Lot: [id number], Expiration date: 11/03/2023, Waste: 0 mL   Post-op Post injection exam found visual acuity of at least counting fingers. The patient tolerated the procedure well. There were no complications. The patient received written and verbal post procedure care education. Post injection medications were not given.      Intravitreal Injection, Pharmacologic Agent - OS - Left Eye       Time Out 01/11/2023. 9:57 AM. Confirmed correct patient, procedure, site, and patient consented.   Anesthesia Topical anesthesia was used. Anesthetic medications included Lidocaine  2%, Proparacaine 0.5%.   Procedure Preparation included 5% betadine to ocular surface, eyelid speculum. A (32g) needle was used.   Injection: 6 mg faricimab -svoa 6 MG/0.05ML   Route: Intravitreal, Site: Left Eye   NDC: 49757-903-93, Lot: [id number], Expiration date: 05/02/2024, Waste: 0 mL   Post-op Post injection exam found visual acuity of at least counting fingers. The patient tolerated the procedure well. There were no complications. The patient received written and verbal post procedure care education. Post injection medications were not given.            ASSESSMENT/PLAN:    ICD-10-CM   1. Exudative age-related macular degeneration of both eyes with active choroidal neovascularization (HCC)  H35.3231 OCT, Retina - OU - Both Eyes    Intravitreal Injection, Pharmacologic Agent - OD - Right Eye    Intravitreal Injection, Pharmacologic Agent - OS - Left Eye    faricimab -svoa (VABYSMO ) 6mg /0.10mL intravitreal injection    aflibercept  (EYLEA ) SOLN 2 mg    2. Subretinal hemorrhage of both eyes  H35.63     3. Serous retinal detachment of left eye  H33.22     4. 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 Essential hypertension  I10     7. Hypertensive retinopathy of both eyes  H35.033     8. Pseudophakia of both eyes  Z96.1      1-3.  Exudative age related macular degeneration / PECHR OU - interval conversion to exu ARMD OD noted on 11.18.22 exam -- new SRH and exudates IT periphery  - initially presented with symptomatic floaters OS - initial exam showed large peripheral temporal subretinal hemorrhage OS -- spanning 2-430 -- also mild vitreous opacities and condensations that were causing symptomatic floaters  - B-scan (9.18.20) showed hyperreflective, ill-defined mass, 0300 periphery OS - was seen by Dr. Beatris, Ocular Oncologist, on 9.22.2020 -- no tumor on exam or b-scan  - s/p IVA OS #1 (08.05.20) for subretinal hemorrhage, peripheral CNV - s/p IVA OD #1 (11.18.22), #2 (12.16.22), #3 (01.13.23), #4 (02.10.23), #5 (03.24.23) - s/p IVE OD #1 (05.08.23), #2 (06.21.23), #3 (08.16.23), #4 (02.27.24), #5 (06.05.24), #6 (10.10.24) - s/p IVE OS #1 (09.02.20) - sample, #2 (10.12.20), #3 (11.11.20), #4 (12.09.20), #5 (1.20.21), #6 (02.17.21), #7 (03.19.21), #8 (04.30.21), #9 (06.16.21), #10 (08.11.21), #11 (12.01.21), #12 (01.26.22), #13 (03.30.22), #14 (06.01.22), #15 (08.10.22), #16 (09.23.22), #17 (11.18.22), #18 (02.10.23), #19 (03.24.23), #20 (05.08.23), #21 (06.21.23), #22 (08.16.23), #23 (10.25.23), #24 (12.20.23), #25 (02.27.24)  - s/p IVV OS #1 (04.24.24), #2 (06.05.24), #3 (07.17.24), #4 (08.28.24), #5 (10.10.24), #6 (11.20.24)  - today BCVA OD  20/40; OS 20/60 -- stable OU  - exam shows peripheral SRH stably improved OU - OCT shows OD: Persistent ERM with interval increase in central cystic changes, bullous schisis with PEDs within IT quad caught on widefield -- not imaged today; OS: Persistent central cystic changes -- trace, persistent ERM with PRF at 6 weeks  - recommend IVE OD #7 and IVV OS #7 today, 01.08.25 w/ f/u in 7 wks   - working plan for OD is possibly injection every other visit (~q3-4 mos)  - pt in agreement and wishes to proceed with injection OS  - RBA of procedure discussed, questions answered   - see  procedure note  - IVV informed consent obtained and signed 04.24.24 (OS)  - [id number] Benefits investigation initiated 09.02.2020 -- approved for 2024 - f/u 7 weeks, sooner prn, OCT -- scan through IT schisis OD, DFE, possible injections  4. Epiretinal membrane, OU.  - mild ERM OU  - asymptomatic, no metamorphopsia  - no indication for surgery at this time  - continue to monitor  5. Retinoschisis OD -- stable - schisis cavity, IT quadrant, from 0700-0830, -- ?less posterior extension ?collapsing  - no associated RT/RD but now with [facility] and exudates -- improving   - discussed findings and prognosis  - continue to monitor  6,7. Hypertensive retinopathy OU  - discussed importance of tight BP control  - continue to monitor  8. Pseudophakia OU  - s/p CE/IOL OU  - doing well  - continue to monitor  Ophthalmic Meds Ordered this visit:  Meds ordered this encounter  Medications   faricimab -svoa (VABYSMO ) 6mg /0.38mL intravitreal injection   aflibercept  (EYLEA ) SOLN 2 mg     Return in about 7 weeks (around 03/01/2023) for f/u exu ARMD OU, DFE, OCT.  There are no Patient Instructions on file for this visit.   Explained the diagnoses, plan, and follow up with the patient and they expressed understanding.  Patient expressed understanding of the importance of proper follow up care.  This document serves as a record of services personally performed by Redell JUDITHANN Hans, MD, PhD. It was created on their behalf by Alan PARAS. Delores, OA an ophthalmic technician. The creation of this record is the provider's dictation and/or activities during the visit.    Electronically signed by: Alan PARAS. Delores, OA 01/11/23 12:16 PM   Redell JUDITHANN Hans, M.D., Ph.D. Diseases & Surgery of the Retina and Vitreous [facility]  I have reviewed the above documentation for accuracy and completeness, and I agree with the above. Redell JUDITHANN Hans, M.D., Ph.D. 01/11/23 12:18 PM   Abbreviations: M  myopia (nearsighted); A astigmatism; H hyperopia (farsighted); P presbyopia; Mrx spectacle prescription;  CTL contact lenses; OD right eye; OS left eye; OU both eyes  XT exotropia; ET esotropia; PEK punctate epithelial keratitis; PEE punctate epithelial erosions; DES dry eye syndrome; MGD meibomian gland dysfunction; ATs artificial tears; PFAT's preservative free artificial tears; NSC nuclear sclerotic cataract; PSC posterior subcapsular cataract; ERM epi-retinal membrane; PVD posterior vitreous detachment; RD retinal detachment; DM diabetes mellitus; DR diabetic retinopathy; NPDR non-proliferative diabetic retinopathy; PDR proliferative diabetic retinopathy; CSME clinically significant macular edema; DME diabetic macular edema; dbh dot blot hemorrhages; CWS cotton wool spot; POAG primary open angle glaucoma; C/D cup-to-disc ratio; HVF humphrey visual field; GVF goldmann visual field; OCT optical coherence tomography; IOP intraocular pressure; BRVO Branch retinal vein occlusion; CRVO central retinal vein occlusion; CRAO central retinal artery occlusion; BRAO branch retinal artery occlusion; RT  retinal tear; SB scleral buckle; PPV pars plana vitrectomy; VH Vitreous hemorrhage; PRP panretinal laser photocoagulation; IVK intravitreal kenalog; VMT vitreomacular traction; MH Macular hole;  NVD neovascularization of the disc; NVE neovascularization elsewhere; AREDS age related eye disease study; ARMD age related macular degeneration; POAG primary open angle glaucoma; EBMD epithelial/anterior basement membrane dystrophy; ACIOL anterior chamber intraocular lens; IOL intraocular lens; PCIOL posterior chamber intraocular lens; Phaco/IOL phacoemulsification with intraocular lens placement; PRK photorefractive keratectomy; LASIK laser assisted in situ keratomileusis; HTN hypertension; DM diabetes mellitus; COPD chronic obstructive pulmonary disease

## 2023-01-11 ENCOUNTER — Encounter (INDEPENDENT_AMBULATORY_CARE_PROVIDER_SITE_OTHER): Payer: Self-pay | Admitting: Ophthalmology

## 2023-01-11 ENCOUNTER — Ambulatory Visit (INDEPENDENT_AMBULATORY_CARE_PROVIDER_SITE_OTHER): Payer: Medicare Other | Admitting: Ophthalmology

## 2023-01-11 DIAGNOSIS — I10 Essential (primary) hypertension: Secondary | ICD-10-CM

## 2023-01-11 DIAGNOSIS — H3322 Serous retinal detachment, left eye: Secondary | ICD-10-CM | POA: Diagnosis not present

## 2023-01-11 DIAGNOSIS — H33101 Unspecified retinoschisis, right eye: Secondary | ICD-10-CM

## 2023-01-11 DIAGNOSIS — H3563 Retinal hemorrhage, bilateral: Secondary | ICD-10-CM

## 2023-01-11 DIAGNOSIS — H35033 Hypertensive retinopathy, bilateral: Secondary | ICD-10-CM

## 2023-01-11 DIAGNOSIS — Z961 Presence of intraocular lens: Secondary | ICD-10-CM

## 2023-01-11 DIAGNOSIS — H353231 Exudative age-related macular degeneration, bilateral, with active choroidal neovascularization: Secondary | ICD-10-CM | POA: Diagnosis not present

## 2023-01-11 DIAGNOSIS — H35373 Puckering of macula, bilateral: Secondary | ICD-10-CM

## 2023-01-11 MED ORDER — FARICIMAB-SVOA 6 MG/0.05ML IZ SOSY
6.0000 mg | PREFILLED_SYRINGE | INTRAVITREAL | Status: AC | PRN
  Administered 2023-01-11: 6 mg via INTRAVITREAL

## 2023-01-11 MED ORDER — AFLIBERCEPT 2MG/0.05ML IZ SOLN FOR KALEIDOSCOPE
2.0000 mg | INTRAVITREAL | Status: AC | PRN
  Administered 2023-01-11: 2 mg via INTRAVITREAL

## 2023-02-04 ENCOUNTER — Emergency Department (HOSPITAL_BASED_OUTPATIENT_CLINIC_OR_DEPARTMENT_OTHER): Payer: Medicare Other | Admitting: Radiology

## 2023-02-04 ENCOUNTER — Encounter (HOSPITAL_BASED_OUTPATIENT_CLINIC_OR_DEPARTMENT_OTHER): Payer: Self-pay

## 2023-02-04 ENCOUNTER — Other Ambulatory Visit: Payer: Self-pay

## 2023-02-04 ENCOUNTER — Emergency Department (HOSPITAL_BASED_OUTPATIENT_CLINIC_OR_DEPARTMENT_OTHER)
Admission: EM | Admit: 2023-02-04 | Discharge: 2023-02-04 | Disposition: A | Discharge status: Home / Self Care | Payer: Medicare Other | Attending: Emergency Medicine | Admitting: Emergency Medicine

## 2023-02-04 DIAGNOSIS — I129 Hypertensive chronic kidney disease with stage 1 through stage 4 chronic kidney disease, or unspecified chronic kidney disease: Secondary | ICD-10-CM | POA: Insufficient documentation

## 2023-02-04 DIAGNOSIS — Z79899 Other long term (current) drug therapy: Secondary | ICD-10-CM | POA: Diagnosis not present

## 2023-02-04 DIAGNOSIS — R5383 Other fatigue: Secondary | ICD-10-CM | POA: Diagnosis present

## 2023-02-04 DIAGNOSIS — E860 Dehydration: Secondary | ICD-10-CM | POA: Diagnosis not present

## 2023-02-04 DIAGNOSIS — Z20822 Contact with and (suspected) exposure to covid-19: Secondary | ICD-10-CM | POA: Insufficient documentation

## 2023-02-04 DIAGNOSIS — F0390 Unspecified dementia without behavioral disturbance: Secondary | ICD-10-CM | POA: Diagnosis not present

## 2023-02-04 DIAGNOSIS — N189 Chronic kidney disease, unspecified: Secondary | ICD-10-CM | POA: Diagnosis not present

## 2023-02-04 LAB — URINALYSIS, ROUTINE W REFLEX MICROSCOPIC
Bilirubin Urine: NEGATIVE
Glucose, UA: NEGATIVE mg/dL
Hgb urine dipstick: NEGATIVE
Ketones, ur: NEGATIVE mg/dL
Leukocytes,Ua: NEGATIVE
Nitrite: NEGATIVE
Protein, ur: NEGATIVE mg/dL
Specific Gravity, Urine: <1.005 — ABNORMAL LOW (ref 1.005–1.030)
pH: 7.5 (ref 5.0–8.0)

## 2023-02-04 LAB — CBC
HCT: 42.9 % (ref 36.0–46.0)
Hemoglobin: 13.8 g/dL (ref 12.0–15.0)
MCH: 28.7 pg (ref 26.0–34.0)
MCHC: 32.2 g/dL (ref 30.0–36.0)
MCV: 89.2 fL (ref 80.0–100.0)
Platelets: 263 10*3/uL (ref 150–400)
RBC: 4.81 MIL/uL (ref 3.87–5.11)
RDW: 13.7 % (ref 11.5–15.5)
WBC: 5.8 10*3/uL (ref 4.0–10.5)
nRBC: 0 % (ref 0.0–0.2)

## 2023-02-04 LAB — RESP PANEL BY RT-PCR (RSV, FLU A&B, COVID)  RVPGX2
Influenza A by PCR: NEGATIVE
Influenza B by PCR: NEGATIVE
Resp Syncytial Virus by PCR: NEGATIVE
SARS Coronavirus 2 by RT PCR: NEGATIVE

## 2023-02-04 LAB — BASIC METABOLIC PANEL
Anion gap: 8 (ref 5–15)
BUN: 20 mg/dL (ref 8–23)
CO2: 28 mmol/L (ref 22–32)
Calcium: 10.2 mg/dL (ref 8.9–10.3)
Chloride: 102 mmol/L (ref 98–111)
Creatinine, Ser: 1.4 mg/dL — ABNORMAL HIGH (ref 0.44–1.00)
GFR, Estimated: 35 mL/min — ABNORMAL LOW (ref >60)
Glucose, Bld: 164 mg/dL — ABNORMAL HIGH (ref 70–99)
Potassium: 4.8 mmol/L (ref 3.5–5.1)
Sodium: 138 mmol/L (ref 135–145)

## 2023-02-04 LAB — CBG MONITORING, ED: Glucose-Capillary: 158 mg/dL — ABNORMAL HIGH (ref 70–99)

## 2023-02-04 MED ORDER — SODIUM CHLORIDE 0.9 % IV BOLUS
1000.0000 mL | Freq: Once | INTRAVENOUS | Status: AC
  Administered 2023-02-04: 1000 mL via INTRAVENOUS

## 2023-02-04 NOTE — ED Provider Notes (Signed)
[place] EMERGENCY DEPARTMENT AT [facility] Provider Note  CSN: [id number] Arrival date & time: 02/04/23 1346  Chief Complaint(s) Fatigue  HPI Tracey Rosario is a 89 y.o. female here today for weakness.  Patient has a history of dementia, lives in a memory unit.  She is here today with her daughter because she lives very weak today.  Daughter says that she went to visit the patient and the patient was unable to get a bed due to generalized weakness and fatigue.   Past Medical History Past Medical History:  Diagnosis Date   Acid reflux    Anemia    Arthropathy    Bradycardia    beta blocker reduced in 2021 due to this   CKD (chronic kidney disease), stage III (HCC)    Cognitive decline    referred to neurologist   Depression 10/07/2013   daughter didn't know anything about this   Dizziness    Frequent PVCs    GERD (gastroesophageal reflux disease) 05/04/2013   Hip fx (HCC)    r hip, bilat pubis rami   Hyperlipemia    Hypertension    Hypertensive retinopathy    OU   Hypothyroidism    Insomnia    Macular degeneration    Exu OS   Multilevel degenerative disc disease    Palpitations 05/04/2013   saw cardiologist in 2015, he deleted A. Fib from the record as per note not documentation to support   Premature atrial contractions    PSVT (paroxysmal supraventricular tachycardia) (HCC)    a. short runs by monitor in 2021.   Short-term memory loss    Vitamin D deficiency    Patient Active Problem List   Diagnosis Date Noted   Hypokalemia 01/12/2022   Medication management 01/07/2022   Pneumonia of right lung due to infectious organism 01/07/2022   RSV infection 01/07/2022   Palliative care encounter 01/06/2022   Acute hypoxic respiratory failure (HCC) 01/06/2022   Goals of care, counseling/discussion 01/06/2022   DNR (do not resuscitate) 01/06/2022   Shortness of breath 01/06/2022   High risk medication use 01/06/2022   Agitation 01/06/2022   Sepsis  with acute hypoxic respiratory failure without septic shock (HCC) 01/06/2022   Need for emotional support 01/06/2022   Counseling and coordination of care 01/06/2022   Severe sepsis (HCC) 01/05/2022   Toxic encephalopathy 12/23/2021   Bradycardia 04/22/2019   Age-related memory disorder 11/12/2018   Other osteoporosis without current pathological fracture 01/28/2016   Memory impairment 10/15/2015   Esophageal reflux 06/11/2015   Small vessel disease (HCC) 04/30/2015   Essential hypertension 04/07/2015   Tremor 04/07/2015   Major depressive disorder with single episode, in full remission (HCC) 03/30/2015   Absolute anemia 03/30/2015   Asymptomatic PVCs 08/01/2013   Hyperlipidemia 06/04/2013   Home Medication(s) Prior to Admission medications   Medication Sig Start Date End Date Taking? Authorizing Provider  albuterol (VENTOLIN HFA) 108 (90 Base) MCG/ACT inhaler Inhale 2 puffs into the lungs every 6 (six) hours as needed for wheezing or shortness of breath. 12/25/21   Leroy Sea, MD  amLODipine (NORVASC) 10 MG tablet Take 10 mg by mouth daily. 12/05/19   [provider]  Bromfenac Sodium (PROLENSA) 0.07 % SOLN Place 1 drop into the left eye in the morning and at bedtime. 09/23/22   Rennis Chris, MD  Cholecalciferol (VITAMIN D3) 125 MCG (5000 UT) TABS Take 5,000 Units by mouth daily.     [provider]  citalopram (CELEXA)  20 MG tablet Take 20 mg by mouth daily. 04/29/19   [provider]  donepezil (ARICEPT) 23 MG TABS tablet Take 23 mg by mouth daily. 11/22/21   [provider]  ferrous gluconate (FERGON) 324 MG tablet Take 324 mg by mouth daily with breakfast. 04/29/19   [provider]  furosemide (LASIX) 20 MG tablet Take 1 tablet (20 mg total) by mouth daily. 01/14/22 01/14/23  Pokhrel, Rebekah Chesterfield, MD  gabapentin (NEURONTIN) 300 MG capsule Take 300 mg by mouth 3 (three) times daily as needed (for neuropathic pain). 06/05/20   [provider]  levothyroxine (SYNTHROID) 75 MCG tablet Take 75 mcg by mouth daily before breakfast. 03/04/20   [provider]  lisinopril (ZESTRIL) 10 MG tablet Take 10 mg by mouth daily. 12/02/20   [provider]  memantine (NAMENDA) 5 MG tablet TAKE 1 TABLET BY MOUTH EVERYDAY AT BEDTIME Patient taking differently: Take 5 mg by mouth at bedtime. 12/10/21   Marcos Eke, PA-C  Nystatin (GERHARDT'S BUTT CREAM) CREA Apply 1 Application topically daily. 01/14/22   Pokhrel, Rebekah Chesterfield, MD  omeprazole (PRILOSEC) 20 MG capsule TAKE 1 CAPSULE DAILY Patient taking differently: Take 20 mg by mouth daily before breakfast. 10/20/17   [name], DO  ondansetron (ZOFRAN) 4 MG tablet Take 1 tablet (4 mg total) by mouth every 6 (six) hours as needed for nausea or vomiting. 01/14/22   Pokhrel, Rebekah Chesterfield, MD  oxybutynin (DITROPAN-XL) 10 MG 24 hr tablet Take 10 mg by mouth daily. 10/25/21   [provider]  oxybutynin (DITROPAN-XL) 5 MG 24 hr tablet Take 5 mg by mouth daily. 12/14/20   [provider]  polyvinyl alcohol (LIQUIFILM TEARS) 1.4 % ophthalmic solution Place 1 drop into both eyes as needed for dry eyes.    [provider]  prednisoLONE acetate (PRED FORTE) 1 % ophthalmic suspension Place 1 drop into the left eye in the morning and at bedtime. 09/23/22   Rennis Chris, MD  Probiotic Product (PROBIOTIC PO) Take 1 capsule by mouth daily after breakfast.    [provider]                                                                                                                                    Past Surgical History Past Surgical History:  Procedure Laterality Date   ABDOMINAL HYSTERECTOMY  1971   BLADDER SURGERY  2010   Broken Pelvis  2015   C-EYE SURGERY PROCEDURE Bilateral    [facility]   CATARACT EXTRACTION Bilateral    Done in [place]   EYE SURGERY Bilateral    Cat Sx   HIP PINNING,CANNULATED Right 10/07/2013   Procedure: CANNULATED screws  right hip;  Surgeon: Kathryne Hitch, MD;  Location: [facility] OR;  Service: Orthopedics;  Laterality: Right;   Family History Family History  Problem Relation Age of Onset   Prostate cancer Father  Heart attack Sister    Cancer Brother    Cancer Sister    Heart attack Sister    Cancer Sister    Stroke Daughter    Dementia Neg Hx    Alzheimer's disease Neg Hx     Social History Social History   Tobacco Use   Smoking status: Former    Current packs/day: 0.00    Types: Cigarettes    Quit date: 08/02/1970    Years since quitting: 52.5   Smokeless tobacco: Never   Tobacco comments:    "smoked very little"  Vaping Use   Vaping status: Never Used  Substance Use Topics   Alcohol use: Never    Alcohol/week: 0.0 standard drinks of alcohol   Drug use: Never   Allergies Codeine  Review of Systems Review of Systems  Physical Exam Vital Signs  I have reviewed the triage vital signs BP (!) 164/65 (BP Location: Right Arm)   Pulse 71   Temp 98.7 F (37.1 C) (Oral)   Resp 18   Ht 5\' 11"  (1.803 m)   Wt 71.2 kg   SpO2 92%   BMI 21.90 kg/m   Physical Exam Vitals reviewed.  Cardiovascular:     Rate and Rhythm: Normal rate.  Pulmonary:     Effort: Pulmonary effort is normal.     Breath sounds: Normal breath sounds.  Abdominal:     General: Abdomen is flat. There is no distension.     Palpations: Abdomen is soft.     Tenderness: There is no abdominal tenderness. There is no guarding.  Musculoskeletal:        General: No swelling or deformity. Normal range of motion.  Neurological:     Mental Status: She is alert. Mental status is at baseline.     Cranial Nerves: No cranial nerve deficit.     Sensory: No sensory deficit.     Motor: No weakness.     ED Results and Treatments Labs (all labs ordered are listed, but only abnormal results are displayed) Labs Reviewed  BASIC METABOLIC PANEL - Abnormal; Notable for the following components:      Result Value    Glucose, Bld 164 (*)    Creatinine, Ser 1.40 (*)    GFR, Estimated 35 (*)    All other components within normal limits  URINALYSIS, ROUTINE W REFLEX MICROSCOPIC - Abnormal; Notable for the following components:   Color, Urine COLORLESS (*)    Specific Gravity, Urine <1.005 (*)    All other components within normal limits  CBG MONITORING, ED - Abnormal; Notable for the following components:   Glucose-Capillary 158 (*)    All other components within normal limits  RESP PANEL BY RT-PCR (RSV, FLU A&B, COVID)  RVPGX2  CBC                                                                                                                          Radiology DG Chest Port 1  View Result Date: 02/04/2023 CLINICAL DATA:  Cough.  Generalized body weakness. EXAM: PORTABLE CHEST 1 VIEW COMPARISON:  01/17/2022 FINDINGS: Heart size and mediastinal contours are unremarkable. Lungs are hyperinflated. Biapical pleural calcifications are unchanged in the interval. No signs of pleural effusion, interstitial edema or consolidative change. Visualized osseous structures are unremarkable. IMPRESSION: 1. No acute findings. 2. Hyperinflation compatible with COPD. Electronically Signed   By: Signa Kell M.D.   On: 02/04/2023 15:58    Pertinent labs & imaging results that were available during my care of the patient were reviewed by me and considered in my medical decision making (see MDM for details).  Medications Ordered in ED Medications  sodium chloride 0.9 % bolus 1,000 mL (1,000 mLs Intravenous New Bag/Given 02/04/23 1601)                                                                                                                                     Procedures Procedures  (including critical care time)  Medical Decision Making / ED Course   This patient presents to the ED for concern of generalized weakness, this involves an extensive number of treatment options, and is a complaint that carries with it a  high risk of complications and morbidity.  The differential diagnosis includes dehydration, viral syndrome, cystitis, less likely heart block.  MDM: On exam, patient overall looks quite well.  Very bright, engaged in history and exam.  Patient with normal blood pressure, afebrile, normal heart rate.  Her EKG does show PACs, looking previously through her chart this appears to be frequent for her.  Patient with an elevated creatinine today.  Does look a little bit dry.  Will provide her with some IV fluids, encouraging her to take p.o. fluids here.  Pending urinalysis.  Chest x-ray ordered.  Viral swab negative.  Reassessment 6:10 PM-patient's urine without signs of infection.  Discussed importance of hydration with the patient's daughter.  They prefer to return to the patient's facility.  Advised follow-up with PCP early next week for repeat labs.  Will have her hold her lisinopril for the next 2 days.  Return precautions discussed at bedside.   Additional history obtained: -Additional history obtained from daughter at bedside -External records from outside source obtained and reviewed including: Chart review including previous notes, labs, imaging, consultation notes   Lab Tests: -I ordered, reviewed, and interpreted labs.   The pertinent results include:   Labs Reviewed  BASIC METABOLIC PANEL - Abnormal; Notable for the following components:      Result Value   Glucose, Bld 164 (*)    Creatinine, Ser 1.40 (*)    GFR, Estimated 35 (*)    All other components within normal limits  URINALYSIS, ROUTINE W REFLEX MICROSCOPIC - Abnormal; Notable for the following components:   Color, Urine COLORLESS (*)    Specific Gravity, Urine <1.005 (*)    All other components within normal  limits  CBG MONITORING, ED - Abnormal; Notable for the following components:   Glucose-Capillary 158 (*)    All other components within normal limits  RESP PANEL BY RT-PCR (RSV, FLU A&B, COVID)  RVPGX2  CBC       EKG   EKG Interpretation Date/Time:  Saturday February 04 2023 14:05:08 EST Ventricular Rate:  89 PR Interval:    QRS Duration:  80 QT Interval:  380 QTC Calculation: 462 R Axis:   52  Text Interpretation: Undetermined rhythm Possible Anterolateral infarct , age undetermined Abnormal ECG When compared with ECG of 06-Jan-2022 04:25, PREVIOUS ECG IS PRESENT Junctional rhythm, pauses, likely PACs Confirmed by Anders Simmonds [id number]) on 02/04/2023 3:16:24 PM         Imaging Studies ordered: I ordered imaging studies including  I independently visualized and interpreted imaging. I agree with the radiologist interpretation   Medicines ordered and prescription drug management: Meds ordered this encounter  Medications   sodium chloride 0.9 % bolus 1,000 mL    -I have reviewed the patients home medicines and have made adjustments as needed  Cardiac Monitoring: The patient was maintained on a cardiac monitor.  I personally viewed and interpreted the cardiac monitored which showed an underlying rhythm of: Sinus rhythm, PACs  Social Determinants of Health:  Factors impacting patients care include: Advanced age, dementia   Reevaluation: After the interventions noted above, I reevaluated the patient and found that they have :improved  Co morbidities that complicate the patient evaluation  Past Medical History:  Diagnosis Date   Acid reflux    Anemia    Arthropathy    Bradycardia    beta blocker reduced in 2021 due to this   CKD (chronic kidney disease), stage III (HCC)    Cognitive decline    referred to neurologist   Depression 10/07/2013   daughter didn't know anything about this   Dizziness    Frequent PVCs    GERD (gastroesophageal reflux disease) 05/04/2013   Hip fx (HCC)    r hip, bilat pubis rami   Hyperlipemia    Hypertension    Hypertensive retinopathy    OU   Hypothyroidism    Insomnia    Macular degeneration    Exu OS   Multilevel degenerative disc  disease    Palpitations 05/04/2013   saw cardiologist in 2015, he deleted A. Fib from the record as per note not documentation to support   Premature atrial contractions    PSVT (paroxysmal supraventricular tachycardia) (HCC)    a. short runs by monitor in 2021.   Short-term memory loss    Vitamin D deficiency       Dispostion: I considered admission for this patient, however patient received IV fluids, is tolerating p.o.  With her dementia, hospitalization carries inherent risks, and patient is appropriate for outpatient treatment.     Final Clinical Impression(s) / ED Diagnoses Final diagnoses:  Dehydration     @PCDICTATION @    Anders Simmonds T, DO 02/04/23 1815

## 2023-02-04 NOTE — ED Triage Notes (Signed)
Patient arrives with complaints of generalized body weakness that started today. Patient reports no fevers/chills, or vomiting.  She is a resident of [facility] and she is accompanied by her daughter.

## 2023-02-04 NOTE — ED Notes (Signed)
Unable to urinate at this time, apple juice given

## 2023-02-04 NOTE — Discharge Instructions (Signed)
While you are in the emergency room, you had blood work done which showed that you have what we call an acute kidney injury.  This often happens from problems like dehydration.  So over the next couple of days, I would like you to make sure that you are getting plenty to drink.  Please do not take your lisinopril for the next 2 days.  You may begin retaking it on Tuesday.  Please call your primary care office on Monday to discuss getting repeat blood work to check your kidney function.  Return to the emergency department you develop increasing weakness, become confused, or develop fever.

## 2023-02-04 NOTE — ED Notes (Signed)
This RN assisted patient to restroom to obtain urine specimen. Patient was unable to provide specimen at this time.

## 2023-02-24 NOTE — Progress Notes (Signed)
 Marland Kitchen

## 2023-02-28 NOTE — Progress Notes (Signed)
 [facility] - Clinic Note  03/01/2023     CHIEF COMPLAINT Patient presents for Retina Follow Up  HISTORY OF PRESENT ILLNESS: Tracey Rosario is a 89 y.o. female who presents to the clinic today for:   HPI     Retina Follow Up   Patient presents with  Wet AMD.  In both eyes.  Severity is moderate.  Duration of 7 weeks.  Since onset it is stable.  I, the attending physician,  performed the HPI with the patient and updated documentation appropriately.        Comments   7 week Retina eval. Patient states no vision changes      Last edited by Rennis Chris, MD on 03/01/2023  1:34 PM.    Pt states vision is stable   Referring physician: Courtney Paris, NP [address] [place],  [place] [address]  HISTORICAL INFORMATION:   Selected notes from the MEDICAL RECORD NUMBER Referred by Dr. Baker Pierini for retinal eval   CURRENT MEDICATIONS: Current Outpatient Medications (Ophthalmic Drugs)  Medication Sig   Bromfenac Sodium (PROLENSA) 0.07 % SOLN Place 1 drop into the left eye in the morning and at bedtime.   polyvinyl alcohol (LIQUIFILM TEARS) 1.4 % ophthalmic solution Place 1 drop into both eyes as needed for dry eyes.   prednisoLONE acetate (PRED FORTE) 1 % ophthalmic suspension Place 1 drop into the left eye in the morning and at bedtime.   No current facility-administered medications for this visit. (Ophthalmic Drugs)   Current Outpatient Medications (Other)  Medication Sig   albuterol (VENTOLIN HFA) 108 (90 Base) MCG/ACT inhaler Inhale 2 puffs into the lungs every 6 (six) hours as needed for wheezing or shortness of breath.   amLODipine (NORVASC) 10 MG tablet Take 10 mg by mouth daily.   Cholecalciferol (VITAMIN D3) 125 MCG (5000 UT) TABS Take 5,000 Units by mouth daily.    citalopram (CELEXA) 20 MG tablet Take 20 mg by mouth daily.   donepezil (ARICEPT) 23 MG TABS tablet Take 23 mg by mouth daily.   ferrous gluconate (FERGON) 324 MG tablet  Take 324 mg by mouth daily with breakfast.   gabapentin (NEURONTIN) 300 MG capsule Take 300 mg by mouth 3 (three) times daily as needed (for neuropathic pain).   levothyroxine (SYNTHROID) 75 MCG tablet Take 75 mcg by mouth daily before breakfast.   lisinopril (ZESTRIL) 10 MG tablet Take 10 mg by mouth daily.   memantine (NAMENDA) 5 MG tablet TAKE 1 TABLET BY MOUTH EVERYDAY AT BEDTIME (Patient taking differently: Take 5 mg by mouth at bedtime.)   Nystatin (GERHARDT'S BUTT CREAM) CREA Apply 1 Application topically daily.   omeprazole (PRILOSEC) 20 MG capsule TAKE 1 CAPSULE DAILY (Patient taking differently: Take 20 mg by mouth daily before breakfast.)   ondansetron (ZOFRAN) 4 MG tablet Take 1 tablet (4 mg total) by mouth every 6 (six) hours as needed for nausea or vomiting.   oxybutynin (DITROPAN-XL) 10 MG 24 hr tablet Take 10 mg by mouth daily.   oxybutynin (DITROPAN-XL) 5 MG 24 hr tablet Take 5 mg by mouth daily.   Probiotic Product (PROBIOTIC PO) Take 1 capsule by mouth daily after breakfast.   furosemide (LASIX) 20 MG tablet Take 1 tablet (20 mg total) by mouth daily.   No current facility-administered medications for this visit. (Other)   REVIEW OF SYSTEMS: ROS   Positive for: Gastrointestinal, Neurological, Genitourinary, Musculoskeletal, Cardiovascular, Eyes Negative for: Constitutional, Skin, HENT, Endocrine, Respiratory, Psychiatric,  Allergic/Imm, Heme/Lymph Last edited by Lana Fish, COT on 03/01/2023 12:59 PM.      ALLERGIES Allergies  Allergen Reactions   Codeine Other (See Comments)    Altered mental status   PAST MEDICAL HISTORY Past Medical History:  Diagnosis Date   Acid reflux    Anemia    Arthropathy    Bradycardia    beta blocker reduced in 2021 due to this   CKD (chronic kidney disease), stage III (HCC)    Cognitive decline    referred to neurologist   Depression 10/07/2013   daughter didn't know anything about this   Dizziness    Frequent PVCs    GERD  (gastroesophageal reflux disease) 05/04/2013   Hip fx (HCC)    r hip, bilat pubis rami   Hyperlipemia    Hypertension    Hypertensive retinopathy    OU   Hypothyroidism    Insomnia    Macular degeneration    Exu OS   Multilevel degenerative disc disease    Palpitations 05/04/2013   saw cardiologist in 2015, he deleted A. Fib from the record as per note not documentation to support   Premature atrial contractions    PSVT (paroxysmal supraventricular tachycardia) (HCC)    a. short runs by monitor in 2021.   Short-term memory loss    Vitamin D deficiency    Past Surgical History:  Procedure Laterality Date   ABDOMINAL HYSTERECTOMY  1971   BLADDER SURGERY  2010   Broken Pelvis  2015   C-EYE SURGERY PROCEDURE Bilateral    zaldivar   CATARACT EXTRACTION Bilateral    Done in [place]   EYE SURGERY Bilateral    Cat Sx   HIP PINNING,CANNULATED Right 10/07/2013   Procedure: CANNULATED screws right hip;  Surgeon: Kathryne Hitch, MD;  Location: [facility] OR;  Service: Orthopedics;  Laterality: Right;   FAMILY HISTORY Family History  Problem Relation Age of Onset   Prostate cancer Father    Heart attack Sister    Cancer Brother    Cancer Sister    Heart attack Sister    Cancer Sister    Stroke Daughter    Dementia Neg Hx    Alzheimer's disease Neg Hx    SOCIAL HISTORY Social History   Tobacco Use   Smoking status: Former    Current packs/day: 0.00    Types: Cigarettes    Quit date: 08/02/1970    Years since quitting: 52.6   Smokeless tobacco: Never   Tobacco comments:    "smoked very little"  Vaping Use   Vaping status: Never Used  Substance Use Topics   Alcohol use: Never    Alcohol/week: 0.0 standard drinks of alcohol   Drug use: Never       OPHTHALMIC EXAM:  Base Eye Exam     Visual Acuity (Snellen - Linear)       Right Left   Dist [place] 20/40 20/50-2   Dist ph [place] 20/NI 20/NI         Tonometry (Tonopen, 1:01 PM)       Right Left   Pressure 12 10          Pupils       Dark Light Shape React APD   Right 2 1 Round Brisk None   Left 2 1 Round Brisk None         Visual Fields (Counting fingers)       Left Right    Full Full  Extraocular Movement       Right Left    Full, Ortho Full, Ortho         Neuro/Psych     Oriented x3: Yes   Mood/Affect: Normal         Dilation     Both eyes: 1.0% Mydriacyl, 2.5% Phenylephrine @ 1:01 PM           Slit Lamp and Fundus Exam     Slit Lamp Exam       Right Left   Lids/Lashes Dermatochalasis - upper lid Dermatochalasis - upper lid   Conjunctiva/Sclera White and quiet White and quiet   Cornea Arcus, 1+ Punctate epithelial erosions Arcus, 2-3+ Punctate epithelial erosions, EBMD, irregular epi   Anterior Chamber deep and clear deep and clear   Iris Round and moderately dilated Round and moderately dilated to 5mm, mild iridodonesis   Lens PC IOL in good position with open PC, anterior capsular phimosis PC IOL in good position with open PC   Anterior Vitreous Vitreous syneresis, Posterior vitreous detachment Vitreous syneresis; PVD; vitreous condensations         Fundus Exam       Right Left   Disc Mild temporal Pallor, Peripapillary atrophy, Sharp rim, Compact mild tilt, 2+ temporal Pallor, Peripapillary atrophy, Compact, Sharp rim   C/D Ratio 0.1 0.0   Macula Flat, Blunted foveal reflex, mild Epiretinal membrane, drusen, RPE mottling and clumping, No heme, trace cystic changes centrally -- improved Flat, Blunted foveal reflex, RPE mottling and clumping, Epiretinal membrane, trace cystic changes, No heme   Vessels attenuated, mild tortuosity attenuated, Tortuous   Periphery IT schisis cavity from 0700-0830; otherwise attached; stable improvement in [facility] and exudation under schisis cavities [facility] not red and pigmenting in), exudation resolved, focal pigmented CR scar at 0630; no red heme temporal sub-retinal hemorrhage improved - now pigmented CR atrophy;  No SRF  -- completely resolved, shallow schisis IT quad, No heme            IMAGING AND PROCEDURES  Imaging and Procedures for @TODAY @  OCT, Retina - OU - Both Eyes       Right Eye Quality was good. Central Foveal Thickness: 343. Progression has improved. Findings include normal foveal contour, no IRF, no SRF, retinal drusen , epiretinal membrane, macular pucker, pigment epithelial detachment (Persistent ERM with interval improvement in central cystic changes, bullous schisis IT quad caught on widefield - stable).   Left Eye Quality was good. Central Foveal Thickness: 300. Progression has improved. Findings include normal foveal contour, no SRF, abnormal foveal contour, retinal drusen , epiretinal membrane, preretinal fibrosis (Interval improvement in central cystic changes, persistent ERM with PRF).   Notes *Images captured and stored on drive  Diagnosis / Impression:  OD: Persistent ERM with interval improvement in central cystic changes, bullous schisis IT quad caught on widefield - stable OS: Interval improvement in central cystic changes, persistent ERM with PRF  Clinical management:  See below  Abbreviations: NFP - Normal foveal profile. CME - cystoid macular edema. PED - pigment epithelial detachment. IRF - intraretinal fluid. SRF - subretinal fluid. EZ - ellipsoid zone. ERM - epiretinal membrane. ORA - outer retinal atrophy. ORT - outer retinal tubulation. SRHM - subretinal hyper-reflective material      Intravitreal Injection, Pharmacologic Agent - OD - Right Eye       Time Out 03/01/2023. 1:40 PM. Confirmed correct patient, procedure, site, and patient consented.   Anesthesia Topical anesthesia was used. Anesthetic medications included  Lidocaine 2%, Proparacaine 0.5%.   Procedure Preparation included 5% betadine to ocular surface, eyelid speculum. A (32g) needle was used.   Injection: 2 mg aflibercept 2 MG/0.05ML   Route: Intravitreal, Site: Right Eye   NDC:  L6038910, Lot: [id number], Expiration date: 02/03/2024, Waste: 0 mL   Post-op Post injection exam found visual acuity of at least counting fingers. The patient tolerated the procedure well. There were no complications. The patient received written and verbal post procedure care education. Post injection medications were not given.      Intravitreal Injection, Pharmacologic Agent - OS - Left Eye       Time Out 03/01/2023. 1:40 PM. Confirmed correct patient, procedure, site, and patient consented.   Anesthesia Topical anesthesia was used. Anesthetic medications included Lidocaine 2%, Proparacaine 0.5%.   Procedure Preparation included 5% betadine to ocular surface, eyelid speculum. A (32g) needle was used.   Injection: 6 mg faricimab-svoa 6 MG/0.05ML   Route: Intravitreal, Site: Left Eye   NDC: 09811-914-78, Lot: [id number], Expiration date: 05/02/2024, Waste: 0 mL   Post-op Post injection exam found visual acuity of at least counting fingers. The patient tolerated the procedure well. There were no complications. The patient received written and verbal post procedure care education. Post injection medications were not given.            ASSESSMENT/PLAN:    ICD-10-CM   1. Exudative age-related macular degeneration of both eyes with active choroidal neovascularization (HCC)  H35.3231 OCT, Retina - OU - Both Eyes    Intravitreal Injection, Pharmacologic Agent - OD - Right Eye    Intravitreal Injection, Pharmacologic Agent - OS - Left Eye    faricimab-svoa (VABYSMO) 6mg /0.2mL intravitreal injection    aflibercept (EYLEA) SOLN 2 mg    2. Subretinal hemorrhage of both eyes  H35.63     3. Serous retinal detachment of left eye  H33.22     4. 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 Essential hypertension  I10     7. Hypertensive retinopathy of both eyes  H35.033     8. Pseudophakia of both eyes  Z96.1      1-3. Exudative  age related macular degeneration / PECHR OU - interval conversion to exu ARMD OD noted on 11.18.22 exam -- new SRH and exudates IT periphery  - initially presented with symptomatic floaters OS - initial exam showed large peripheral temporal subretinal hemorrhage OS -- spanning 2-430 -- also mild vitreous opacities and condensations that were causing symptomatic floaters  - B-scan (9.18.20) showed hyperreflective, ill-defined mass, 0300 periphery OS - was seen by Dr. Pearletha Furl, Ocular Oncologist, on 9.22.2020 -- no tumor on exam or b-scan  - s/p IVA OS #1 (08.05.20) for subretinal hemorrhage, peripheral CNV - s/p IVA OD #1 (11.18.22), #2 (12.16.22), #3 (01.13.23), #4 (02.10.23), #5 (03.24.23) - s/p IVE OD #1 (05.08.23), #2 (06.21.23), #3 (08.16.23), #4 (02.27.24), #5 (06.05.24), #6 (10.10.24), #7 (01.08.25) - s/p IVE OS #1 (09.02.20) - sample, #2 (10.12.20), #3 (11.11.20), #4 (12.09.20), #5 (1.20.21), #6 (02.17.21), #7 (03.19.21), #8 (04.30.21), #9 (06.16.21), #10 (08.11.21), #11 (12.01.21), #12 (01.26.22), #13 (03.30.22), #14 (06.01.22), #15 (08.10.22), #16 (09.23.22), #17 (11.18.22), #18 (02.10.23), #19 (03.24.23), #20 (05.08.23), #21 (06.21.23), #22 (08.16.23), #23 (10.25.23), #24 (12.20.23), #25 (02.27.24)  - s/p IVV OS #1 (04.24.24), #2 (06.05.24), #3 (07.17.24), #4 (08.28.24), #5 (10.10.24), #6 (11.20.24), #7 (01.08.25)  - today BCVA OD 20/40 - stable; OS  20/50 from 20/60  - exam shows peripheral SRH stably improved OU - OCT shows OD: Persistent ERM with interval improvement in central cystic changes, bullous schisis IT quad caught on widefield; OS: interval improvement in central cystic changes, persistent ERM with PRF at 7 weeks  - recommend IVE OD #8 and IVV OS #8 today, 02.26.25 w/ f/u in 8 wks   - working plan for OD is possibly injection every other visit (~q3-4 mos)  - pt in agreement and wishes to proceed with injections OU  - RBA of procedure discussed, questions answered   - see  procedure note  - IVV informed consent obtained and signed 04.24.24 (OS)  - [id number] Benefits investigation initiated 09.02.2020 -- approved for 2024 - f/u 8 weeks, sooner prn, OCT -- scan through IT schisis OD, DFE, possible injections  4. Epiretinal membrane, OU.  - mild ERM OU  - asymptomatic, no metamorphopsia  - no indication for surgery at this time  - continue to monitor  5. Retinoschisis OD -- stable - schisis cavity, IT quadrant, from 0700-0830, -- ?less posterior extension ?collapsing  - no associated RT/RD but now with [facility] and exudates -- improving   - discussed findings and prognosis  - continue to monitor  6,7. Hypertensive retinopathy OU  - discussed importance of tight BP control  - continue to monitor  8. Pseudophakia OU  - s/p CE/IOL OU  - doing well  - continue to monitor  Ophthalmic Meds Ordered this visit:  Meds ordered this encounter  Medications   faricimab-svoa (VABYSMO) 6mg /0.17mL intravitreal injection   aflibercept (EYLEA) SOLN 2 mg     Return in about 8 weeks (around 04/26/2023) for f/u exu ARMD OU, DFE, OCT, Possible Injxn.  There are no Patient Instructions on file for this visit.   Explained the diagnoses, plan, and follow up with the patient and they expressed understanding.  Patient expressed understanding of the importance of proper follow up care.  This document serves as a record of services personally performed by Karie Chimera, MD, PhD. It was created on their behalf by Glee Arvin. Manson Passey, OA an ophthalmic technician. The creation of this record is the provider's dictation and/or activities during the visit.    Electronically signed by: Glee Arvin. Manson Passey, OA 03/01/23 6:12 PM  Karie Chimera, M.D., Ph.D. Diseases & Surgery of the Retina and Vitreous [facility]  I have reviewed the above documentation for accuracy and completeness, and I agree with the above. Karie Chimera, M.D., Ph.D. 03/01/23 6:14 PM    Abbreviations: M myopia (nearsighted); A astigmatism; H hyperopia (farsighted); P presbyopia; Mrx spectacle prescription;  CTL contact lenses; OD right eye; OS left eye; OU both eyes  XT exotropia; ET esotropia; PEK punctate epithelial keratitis; PEE punctate epithelial erosions; DES dry eye syndrome; MGD meibomian gland dysfunction; ATs artificial tears; PFAT's preservative free artificial tears; NSC nuclear sclerotic cataract; PSC posterior subcapsular cataract; ERM epi-retinal membrane; PVD posterior vitreous detachment; RD retinal detachment; DM diabetes mellitus; DR diabetic retinopathy; NPDR non-proliferative diabetic retinopathy; PDR proliferative diabetic retinopathy; CSME clinically significant macular edema; DME diabetic macular edema; dbh dot blot hemorrhages; CWS cotton wool spot; POAG primary open angle glaucoma; C/D cup-to-disc ratio; HVF humphrey visual field; GVF goldmann visual field; OCT optical coherence tomography; IOP intraocular pressure; BRVO Branch retinal vein occlusion; CRVO central retinal vein occlusion; CRAO central retinal artery occlusion; BRAO branch retinal artery occlusion; RT retinal tear; SB scleral buckle; PPV pars plana vitrectomy;  VH Vitreous hemorrhage; PRP panretinal laser photocoagulation; IVK intravitreal kenalog; VMT vitreomacular traction; MH Macular hole;  NVD neovascularization of the disc; NVE neovascularization elsewhere; AREDS age related eye disease study; ARMD age related macular degeneration; POAG primary open angle glaucoma; EBMD epithelial/anterior basement membrane dystrophy; ACIOL anterior chamber intraocular lens; IOL intraocular lens; PCIOL posterior chamber intraocular lens; Phaco/IOL phacoemulsification with intraocular lens placement; PRK photorefractive keratectomy; LASIK laser assisted in situ keratomileusis; HTN hypertension; DM diabetes mellitus; COPD chronic obstructive pulmonary disease

## 2023-03-01 ENCOUNTER — Encounter (INDEPENDENT_AMBULATORY_CARE_PROVIDER_SITE_OTHER): Payer: Self-pay | Admitting: Ophthalmology

## 2023-03-01 ENCOUNTER — Ambulatory Visit (INDEPENDENT_AMBULATORY_CARE_PROVIDER_SITE_OTHER): Payer: Medicare Other | Admitting: Ophthalmology

## 2023-03-01 DIAGNOSIS — H3322 Serous retinal detachment, left eye: Secondary | ICD-10-CM | POA: Diagnosis not present

## 2023-03-01 DIAGNOSIS — H35033 Hypertensive retinopathy, bilateral: Secondary | ICD-10-CM

## 2023-03-01 DIAGNOSIS — H353231 Exudative age-related macular degeneration, bilateral, with active choroidal neovascularization: Secondary | ICD-10-CM

## 2023-03-01 DIAGNOSIS — H3563 Retinal hemorrhage, bilateral: Secondary | ICD-10-CM

## 2023-03-01 DIAGNOSIS — Z961 Presence of intraocular lens: Secondary | ICD-10-CM

## 2023-03-01 DIAGNOSIS — I10 Essential (primary) hypertension: Secondary | ICD-10-CM

## 2023-03-01 DIAGNOSIS — H35373 Puckering of macula, bilateral: Secondary | ICD-10-CM

## 2023-03-01 DIAGNOSIS — H33101 Unspecified retinoschisis, right eye: Secondary | ICD-10-CM

## 2023-03-01 MED ORDER — FARICIMAB-SVOA 6 MG/0.05ML IZ SOSY
6.0000 mg | PREFILLED_SYRINGE | INTRAVITREAL | Status: AC | PRN
  Administered 2023-03-01: 6 mg via INTRAVITREAL

## 2023-03-01 MED ORDER — AFLIBERCEPT 2MG/0.05ML IZ SOLN FOR KALEIDOSCOPE
2.0000 mg | INTRAVITREAL | Status: AC | PRN
  Administered 2023-03-01: 2 mg via INTRAVITREAL

## 2023-04-24 NOTE — Progress Notes (Signed)
 [facility] - Clinic Note  04/26/2023     CHIEF COMPLAINT Patient presents for Retina Follow Up  HISTORY OF PRESENT ILLNESS: Tracey Rosario is a 89 y.o. female who presents to the clinic today for:   HPI     Retina Follow Up   Patient presents with  Wet AMD.  In both eyes.  Severity is moderate.  Since onset it is stable.  I, the attending physician,  performed the HPI with the patient and updated documentation appropriately.        Comments   Pt here for 8 wk ret f/u for exu ARMD OU. Pt states VA is stable, no changes noticed.       Last edited by Ronelle Coffee, MD on 04/26/2023  1:57 PM.    Pt states vision is stable   Referring physician: Earlis Glimpse, NP [address] [place],  [place] [address]  HISTORICAL INFORMATION:   Selected notes from the MEDICAL RECORD NUMBER Referred by Dr. Donovan Gallant for retinal eval   CURRENT MEDICATIONS: Current Outpatient Medications (Ophthalmic Drugs)  Medication Sig   Bromfenac  Sodium (PROLENSA ) 0.07 % SOLN Place 1 drop into the left eye in the morning and at bedtime.   polyvinyl alcohol  (LIQUIFILM TEARS) 1.4 % ophthalmic solution Place 1 drop into both eyes as needed for dry eyes.   prednisoLONE  acetate (PRED FORTE ) 1 % ophthalmic suspension Place 1 drop into the left eye in the morning and at bedtime.   No current facility-administered medications for this visit. (Ophthalmic Drugs)   Current Outpatient Medications (Other)  Medication Sig   albuterol  (VENTOLIN  HFA) 108 (90 Base) MCG/ACT inhaler Inhale 2 puffs into the lungs every 6 (six) hours as needed for wheezing or shortness of breath.   amLODipine  (NORVASC ) 10 MG tablet Take 10 mg by mouth daily.   Cholecalciferol  (VITAMIN D3) 125 MCG (5000 UT) TABS Take 5,000 Units by mouth daily.    citalopram  (CELEXA ) 20 MG tablet Take 20 mg by mouth daily.   donepezil  (ARICEPT ) 23 MG TABS tablet Take 23 mg by mouth daily.   ferrous gluconate  (FERGON)  324 MG tablet Take 324 mg by mouth daily with breakfast.   gabapentin  (NEURONTIN ) 300 MG capsule Take 300 mg by mouth 3 (three) times daily as needed (for neuropathic pain).   levothyroxine  (SYNTHROID ) 75 MCG tablet Take 75 mcg by mouth daily before breakfast.   lisinopril  (ZESTRIL ) 10 MG tablet Take 10 mg by mouth daily.   memantine  (NAMENDA ) 5 MG tablet TAKE 1 TABLET BY MOUTH EVERYDAY AT BEDTIME (Patient taking differently: Take 5 mg by mouth at bedtime.)   Nystatin (GERHARDT'S BUTT CREAM) CREA Apply 1 Application topically daily.   omeprazole  (PRILOSEC) 20 MG capsule TAKE 1 CAPSULE DAILY (Patient taking differently: Take 20 mg by mouth daily before breakfast.)   ondansetron  (ZOFRAN ) 4 MG tablet Take 1 tablet (4 mg total) by mouth every 6 (six) hours as needed for nausea or vomiting.   oxybutynin  (DITROPAN -XL) 10 MG 24 hr tablet Take 10 mg by mouth daily.   oxybutynin  (DITROPAN -XL) 5 MG 24 hr tablet Take 5 mg by mouth daily.   Probiotic Product (PROBIOTIC PO) Take 1 capsule by mouth daily after breakfast.   furosemide  (LASIX ) 20 MG tablet Take 1 tablet (20 mg total) by mouth daily.   No current facility-administered medications for this visit. (Other)   REVIEW OF SYSTEMS:    ALLERGIES Allergies  Allergen Reactions   Codeine Other (See  Comments)    Altered mental status   PAST MEDICAL HISTORY Past Medical History:  Diagnosis Date   Acid reflux    Anemia    Arthropathy    Bradycardia    beta blocker reduced in 2021 due to this   CKD (chronic kidney disease), stage III (HCC)    Cognitive decline    referred to neurologist   Depression 10/07/2013   daughter didn't know anything about this   Dizziness    Frequent PVCs    GERD (gastroesophageal reflux disease) 05/04/2013   Hip fx (HCC)    r hip, bilat pubis rami   Hyperlipemia    Hypertension    Hypertensive retinopathy    OU   Hypothyroidism    Insomnia    Macular degeneration    Exu OS   Multilevel degenerative disc  disease    Palpitations 05/04/2013   saw cardiologist in 2015, he deleted A. Fib from the record as per note not documentation to support   Premature atrial contractions    PSVT (paroxysmal supraventricular tachycardia) (HCC)    a. short runs by monitor in 2021.   Short-term memory loss    Vitamin D  deficiency    Past Surgical History:  Procedure Laterality Date   ABDOMINAL HYSTERECTOMY  1971   BLADDER SURGERY  2010   Broken Pelvis  2015   C-EYE SURGERY PROCEDURE Bilateral    zaldivar   CATARACT EXTRACTION Bilateral    Done in [place]    EYE SURGERY Bilateral    Cat Sx   HIP PINNING,CANNULATED Right 10/07/2013   Procedure: CANNULATED screws right hip;  Surgeon: Arnie Lao, MD;  Location: [facility] OR;  Service: Orthopedics;  Laterality: Right;   FAMILY HISTORY Family History  Problem Relation Age of Onset   Prostate cancer Father    Heart attack Sister    Cancer Brother    Cancer Sister    Heart attack Sister    Cancer Sister    Stroke Daughter    Dementia Neg Hx    Alzheimer's disease Neg Hx    SOCIAL HISTORY Social History   Tobacco Use   Smoking status: Former    Current packs/day: 0.00    Types: Cigarettes    Quit date: 08/02/1970    Years since quitting: 52.7   Smokeless tobacco: Never   Tobacco comments:    "smoked very little"  Vaping Use   Vaping status: Never Used  Substance Use Topics   Alcohol  use: Never    Alcohol /week: 0.0 standard drinks of alcohol    Drug use: Never       OPHTHALMIC EXAM:  Base Eye Exam     Visual Acuity (Snellen - Linear)       Right Left   Dist [place] 20/50 +1 20/50 -2   Dist ph [place] 20/40 -2 NI         Tonometry (Tonopen, 1:17 PM)       Right Left   Pressure 15 16         Pupils       Dark Light Shape React APD   Right 2 1 Round Brisk None   Left 2 1 Round Brisk None         Visual Fields (Counting fingers)       Left Right    Full Full         Extraocular Movement       Right Left    Full,  Ortho Full, Ortho  Neuro/Psych     Oriented x3: Yes   Mood/Affect: Normal         Dilation     Both eyes: 1.0% Mydriacyl, 2.5% Phenylephrine @ 1:18 PM           Slit Lamp and Fundus Exam     Slit Lamp Exam       Right Left   Lids/Lashes Dermatochalasis - upper lid Dermatochalasis - upper lid   Conjunctiva/Sclera White and quiet White and quiet   Cornea Arcus, 1+ Punctate epithelial erosions Arcus, 2-3+ Punctate epithelial erosions, EBMD, irregular epi   Anterior Chamber deep and clear deep and clear   Iris Round and moderately dilated Round and moderately dilated to 5mm, mild iridodonesis   Lens PC IOL in good position with open PC, anterior capsular phimosis PC IOL in good position with open PC   Anterior Vitreous Vitreous syneresis, Posterior vitreous detachment Vitreous syneresis; PVD; vitreous condensations         Fundus Exam       Right Left   Disc Mild temporal Pallor, Peripapillary atrophy, Sharp rim, Compact mild tilt, 2+ temporal Pallor, Peripapillary atrophy, Compact, Sharp rim   C/D Ratio 0.1 0.0   Macula Flat, Blunted foveal reflex, mild Epiretinal membrane, drusen, RPE mottling and clumping, No heme, trace cystic changes centrally -- stable improved Flat, Blunted foveal reflex, RPE mottling and clumping, Epiretinal membrane, trace cystic changes, No heme   Vessels attenuated, mild tortuosity attenuated, Tortuous   Periphery IT schisis cavity from 0700-0830; otherwise attached; stable improvement in [facility] and exudation under schisis cavities [facility] not red and pigmenting in), exudation resolved, focal pigmented CR scar at 0630; no red heme temporal sub-retinal hemorrhage improved - now pigmented CR atrophy;  No SRF -- completely resolved, shallow schisis IT quad, No heme            IMAGING AND PROCEDURES  Imaging and Procedures for @TODAY @  OCT, Retina - OU - Both Eyes       Right Eye Quality was good. Central Foveal Thickness: 324.  Progression has improved. Findings include normal foveal contour, no IRF, no SRF, retinal drusen , epiretinal membrane, macular pucker, pigment epithelial detachment (Persistent ERM with stable improvement in central cystic changes, interval collapse of PEDs inferior to fovea, bullous schisis IT quad caught on widefield - stable).   Left Eye Quality was good. Central Foveal Thickness: 298. Progression has been stable. Findings include normal foveal contour, no SRF, abnormal foveal contour, retinal drusen , epiretinal membrane, preretinal fibrosis (Stable improvement in central cystic changes, persistent ERM with PRF).   Notes *Images captured and stored on drive  Diagnosis / Impression:  OD: Persistent ERM with stable improvement in central cystic changes, interval collapse of PEDs inferior to fovea, bullous schisis IT quad caught on widefield - stable OS: stable improvement in central cystic changes, persistent ERM with PRF  Clinical management:  See below  Abbreviations: NFP - Normal foveal profile. CME - cystoid macular edema. PED - pigment epithelial detachment. IRF - intraretinal fluid. SRF - subretinal fluid. EZ - ellipsoid zone. ERM - epiretinal membrane. ORA - outer retinal atrophy. ORT - outer retinal tubulation. SRHM - subretinal hyper-reflective material      Intravitreal Injection, Pharmacologic Agent - OS - Left Eye       Time Out 04/26/2023. 1:43 PM. Confirmed correct patient, procedure, site, and patient consented.   Anesthesia Topical anesthesia was used. Anesthetic medications included Lidocaine  2%, Proparacaine 0.5%.   Procedure Preparation included  5% betadine to ocular surface, eyelid speculum. A supplied (32g) needle was used.   Injection: 6 mg faricimab -svoa 6 MG/0.05ML   Route: Intravitreal, Site: Left Eye   NDC: 45409-811-91, Lot: [id number], Expiration date: 05/03/2023, Waste: 0 mL   Post-op Post injection exam found visual acuity of at least counting  fingers. The patient tolerated the procedure well. There were no complications. The patient received written and verbal post procedure care education. Post injection medications were not given.            ASSESSMENT/PLAN:    ICD-10-CM   1. Exudative age-related macular degeneration of both eyes with active choroidal neovascularization (HCC)  H35.3231 OCT, Retina - OU - Both Eyes    Intravitreal Injection, Pharmacologic Agent - OS - Left Eye    faricimab -svoa (VABYSMO ) 6mg /0.54mL intravitreal injection    2. Subretinal hemorrhage of both eyes  H35.63     3. Serous retinal detachment of left eye  H33.22     4. 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 Essential hypertension  I10     7. Hypertensive retinopathy of both eyes  H35.033     8. Pseudophakia of both eyes  Z96.1      1-3. Exudative age related macular degeneration / PECHR OU - interval conversion to exu ARMD OD noted on 11.18.22 exam -- new SRH and exudates IT periphery  - initially presented with symptomatic floaters OS - initial exam showed large peripheral temporal subretinal hemorrhage OS -- spanning 2-430 -- also mild vitreous opacities and condensations that were causing symptomatic floaters  - B-scan (9.18.20) showed hyperreflective, ill-defined mass, 0300 periphery OS - was seen by Dr. Eliot Guernsey, Ocular Oncologist, on 9.22.2020 -- no tumor on exam or b-scan  - s/p IVA OS #1 (08.05.20) for subretinal hemorrhage, peripheral CNV - s/p IVA OD #1 (11.18.22), #2 (12.16.22), #3 (01.13.23), #4 (02.10.23), #5 (03.24.23) - s/p IVE OD #1 (05.08.23), #2 (06.21.23), #3 (08.16.23), #4 (02.27.24), #5 (06.05.24), #6 (10.10.24), #7 (01.08.25), #8 (02.26.25) - s/p IVE OS #1 (09.02.20) - sample, #2 (10.12.20), #3 (11.11.20), #4 (12.09.20), #5 (1.20.21), #6 (02.17.21), #7 (03.19.21), #8 (04.30.21), #9 (06.16.21), #10 (08.11.21), #11 (12.01.21), #12 (01.26.22), #13 (03.30.22), #14  (06.01.22), #15 (08.10.22), #16 (09.23.22), #17 (11.18.22), #18 (02.10.23), #19 (03.24.23), #20 (05.08.23), #21 (06.21.23), #22 (08.16.23), #23 (10.25.23), #24 (12.20.23), #25 (02.27.24)  - s/p IVV OS #1 (04.24.24), #2 (06.05.24), #3 (07.17.24), #4 (08.28.24), #5 (10.10.24), #6 (11.20.24), #7 (01.08.25), #8 (02.26.25)  - today BCVA OD 20/40 - stable; OS 20/50 - stable  - exam shows peripheral SRH stably improved OU - OCT shows OD: Persistent ERM with stable improvement in central cystic changes, interval collapse of PEDs inferior to fovea, bullous schisis IT quad caught on widefield - stable; OS: stable improvement in central cystic changes, persistent ERM with PRF at 8 weeks  - recommend IVV OS #9 today, 04.23.25 w/ f/u ext to 9 wks   - recommend holding tx in OD today  - working plan for OD is possibly injection every other visit (~q3-4 mos)  - pt in agreement and wishes to proceed with injection OS  - RBA of procedure discussed, questions answered   - see procedure note  - IVV informed consent obtained and signed 04.24.24 (OS)  - [email or web address] Benefits investigation initiated 09.02.2020 -- approved for 2024 - f/u 9 weeks, sooner prn, OCT -- scan through IT schisis OD, DFE, possible injections  4. Epiretinal membrane, OU.  - mild ERM OU  - asymptomatic, no metamorphopsia  - no indication for surgery at this time  - continue to monitor  5. Retinoschisis OD -- stable - schisis cavity, IT quadrant, from 0700-0830, -- ?less posterior extension ?collapsing  - no associated RT/RD but now with [facility] and exudates -- improving   - discussed findings and prognosis  - continue to monitor  6,7. Hypertensive retinopathy OU  - discussed importance of tight BP control  - continue to monitor  8. Pseudophakia OU  - s/p CE/IOL OU  - doing well  - continue to monitor  Ophthalmic Meds Ordered this visit:  Meds ordered this encounter  Medications   faricimab -svoa (VABYSMO ) 6mg /0.20mL intravitreal  injection     Return in about 9 weeks (around 06/28/2023) for f/u exu ARMD OU, DFE, OCT, Possible Injxn.  There are no Patient Instructions on file for this visit.   Explained the diagnoses, plan, and follow up with the patient and they expressed understanding.  Patient expressed understanding of the importance of proper follow up care.  This document serves as a record of services personally performed by Jeanice Millard, MD, PhD. It was created on their behalf by Morley Arabia. Bevin Bucks, OA an ophthalmic technician. The creation of this record is the provider's dictation and/or activities during the visit.    Electronically signed by: Morley Arabia. Bevin Bucks, OA 04/30/23 2:17 AM  Jeanice Millard, M.D., Ph.D. Diseases & Surgery of the Retina and Vitreous [facility]  I have reviewed the above documentation for accuracy and completeness, and I agree with the above. Jeanice Millard, M.D., Ph.D. 04/30/23 2:21 AM   Abbreviations: M myopia (nearsighted); A astigmatism; H hyperopia (farsighted); P presbyopia; Mrx spectacle prescription;  CTL contact lenses; OD right eye; OS left eye; OU both eyes  XT exotropia; ET esotropia; PEK punctate epithelial keratitis; PEE punctate epithelial erosions; DES dry eye syndrome; MGD meibomian gland dysfunction; ATs artificial tears; PFAT's preservative free artificial tears; NSC nuclear sclerotic cataract; PSC posterior subcapsular cataract; ERM epi-retinal membrane; PVD posterior vitreous detachment; RD retinal detachment; DM diabetes mellitus; DR diabetic retinopathy; NPDR non-proliferative diabetic retinopathy; PDR proliferative diabetic retinopathy; CSME clinically significant macular edema; DME diabetic macular edema; dbh dot blot hemorrhages; CWS cotton wool spot; POAG primary open angle glaucoma; C/D cup-to-disc ratio; HVF humphrey visual field; GVF goldmann visual field; OCT optical coherence tomography; IOP intraocular pressure; BRVO Branch retinal  vein occlusion; CRVO central retinal vein occlusion; CRAO central retinal artery occlusion; BRAO branch retinal artery occlusion; RT retinal tear; SB scleral buckle; PPV pars plana vitrectomy; VH Vitreous hemorrhage; PRP panretinal laser photocoagulation; IVK intravitreal kenalog; VMT vitreomacular traction; MH Macular hole;  NVD neovascularization of the disc; NVE neovascularization elsewhere; AREDS age related eye disease study; ARMD age related macular degeneration; POAG primary open angle glaucoma; EBMD epithelial/anterior basement membrane dystrophy; ACIOL anterior chamber intraocular lens; IOL intraocular lens; PCIOL posterior chamber intraocular lens; Phaco/IOL phacoemulsification with intraocular lens placement; PRK photorefractive keratectomy; LASIK laser assisted in situ keratomileusis; HTN hypertension; DM diabetes mellitus; COPD chronic obstructive pulmonary disease

## 2023-04-26 ENCOUNTER — Ambulatory Visit (INDEPENDENT_AMBULATORY_CARE_PROVIDER_SITE_OTHER): Payer: Medicare Other | Admitting: Ophthalmology

## 2023-04-26 ENCOUNTER — Encounter (INDEPENDENT_AMBULATORY_CARE_PROVIDER_SITE_OTHER): Payer: Self-pay | Admitting: Ophthalmology

## 2023-04-26 DIAGNOSIS — I10 Essential (primary) hypertension: Secondary | ICD-10-CM

## 2023-04-26 DIAGNOSIS — H3563 Retinal hemorrhage, bilateral: Secondary | ICD-10-CM | POA: Diagnosis not present

## 2023-04-26 DIAGNOSIS — H353231 Exudative age-related macular degeneration, bilateral, with active choroidal neovascularization: Secondary | ICD-10-CM | POA: Diagnosis not present

## 2023-04-26 DIAGNOSIS — H33101 Unspecified retinoschisis, right eye: Secondary | ICD-10-CM

## 2023-04-26 DIAGNOSIS — Z961 Presence of intraocular lens: Secondary | ICD-10-CM

## 2023-04-26 DIAGNOSIS — H3322 Serous retinal detachment, left eye: Secondary | ICD-10-CM | POA: Diagnosis not present

## 2023-04-26 DIAGNOSIS — H35373 Puckering of macula, bilateral: Secondary | ICD-10-CM | POA: Diagnosis not present

## 2023-04-26 DIAGNOSIS — H35033 Hypertensive retinopathy, bilateral: Secondary | ICD-10-CM

## 2023-04-26 MED ORDER — FARICIMAB-SVOA 6 MG/0.05ML IZ SOSY
6.0000 mg | PREFILLED_SYRINGE | INTRAVITREAL | Status: AC | PRN
  Administered 2023-04-26: 6 mg via INTRAVITREAL

## 2023-06-13 NOTE — Progress Notes (Signed)
 [facility] - Clinic Note  06/21/2023     CHIEF COMPLAINT Patient presents for Retina Follow Up  HISTORY OF PRESENT ILLNESS: Tracey Rosario is a 89 y.o. female who presents to the clinic today for:   HPI     Retina Follow Up   Patient presents with  Wet AMD.  In both eyes.  This started 5 years ago.  Severity is moderate.  Duration of 8 weeks.  Since onset it is stable.  I, the attending physician,  performed the HPI with the patient and updated documentation appropriately.        Comments   Retina f/u for exu ARMD OU. Pt states VA is stable, no changes noticed.       Last edited by Ronelle Coffee, MD on 06/21/2023  2:00 PM.     Pt states she is seeing a whole lot   Referring physician: Earlis Glimpse, NP [address] [place],  [place] [address]  HISTORICAL INFORMATION:   Selected notes from the MEDICAL RECORD NUMBER Referred by Dr. Donovan Gallant for retinal eval   CURRENT MEDICATIONS: Current Outpatient Medications (Ophthalmic Drugs)  Medication Sig   polyvinyl alcohol  (LIQUIFILM TEARS) 1.4 % ophthalmic solution Place 1 drop into both eyes as needed for dry eyes.   Bromfenac  Sodium (PROLENSA ) 0.07 % SOLN Place 1 drop into the left eye in the morning and at bedtime. (Patient not taking: Reported on 06/21/2023)   prednisoLONE  acetate (PRED FORTE ) 1 % ophthalmic suspension Place 1 drop into the left eye in the morning and at bedtime. (Patient not taking: Reported on 06/21/2023)   No current facility-administered medications for this visit. (Ophthalmic Drugs)   Current Outpatient Medications (Other)  Medication Sig   albuterol  (VENTOLIN  HFA) 108 (90 Base) MCG/ACT inhaler Inhale 2 puffs into the lungs every 6 (six) hours as needed for wheezing or shortness of breath.   amLODipine  (NORVASC ) 10 MG tablet Take 10 mg by mouth daily.   Cholecalciferol  (VITAMIN D3) 125 MCG (5000 UT) TABS Take 5,000 Units by mouth daily.    citalopram  (CELEXA ) 20  MG tablet Take 20 mg by mouth daily.   donepezil  (ARICEPT ) 23 MG TABS tablet Take 23 mg by mouth daily.   ferrous gluconate  (FERGON) 324 MG tablet Take 324 mg by mouth daily with breakfast.   gabapentin  (NEURONTIN ) 300 MG capsule Take 300 mg by mouth 3 (three) times daily as needed (for neuropathic pain).   levothyroxine  (SYNTHROID ) 75 MCG tablet Take 75 mcg by mouth daily before breakfast.   lisinopril  (ZESTRIL ) 10 MG tablet Take 10 mg by mouth daily.   memantine  (NAMENDA ) 5 MG tablet TAKE 1 TABLET BY MOUTH EVERYDAY AT BEDTIME (Patient taking differently: Take 5 mg by mouth at bedtime.)   Nystatin (GERHARDT'S BUTT CREAM) CREA Apply 1 Application topically daily.   omeprazole  (PRILOSEC) 20 MG capsule TAKE 1 CAPSULE DAILY (Patient taking differently: Take 20 mg by mouth daily before breakfast.)   ondansetron  (ZOFRAN ) 4 MG tablet Take 1 tablet (4 mg total) by mouth every 6 (six) hours as needed for nausea or vomiting.   oxybutynin  (DITROPAN -XL) 10 MG 24 hr tablet Take 10 mg by mouth daily.   oxybutynin  (DITROPAN -XL) 5 MG 24 hr tablet Take 5 mg by mouth daily.   Probiotic Product (PROBIOTIC PO) Take 1 capsule by mouth daily after breakfast.   furosemide  (LASIX ) 20 MG tablet Take 1 tablet (20 mg total) by mouth daily. (Patient not taking: Reported on 06/21/2023)  No current facility-administered medications for this visit. (Other)   REVIEW OF SYSTEMS: ROS   Positive for: Gastrointestinal, Neurological, Genitourinary, Musculoskeletal, Cardiovascular, Eyes Negative for: Constitutional, Skin, HENT, Endocrine, Respiratory, Psychiatric, Allergic/Imm, Heme/Lymph Last edited by Carrington Clack, COT on 06/21/2023 12:39 PM.       ALLERGIES Allergies  Allergen Reactions   Codeine Other (See Comments)    Altered mental status   PAST MEDICAL HISTORY Past Medical History:  Diagnosis Date   Acid reflux    Anemia    Arthropathy    Bradycardia    beta blocker reduced in 2021 due to this   CKD  (chronic kidney disease), stage III (HCC)    Cognitive decline    referred to neurologist   Depression 10/07/2013   daughter didn't know anything about this   Dizziness    Frequent PVCs    GERD (gastroesophageal reflux disease) 05/04/2013   Hip fx (HCC)    r hip, bilat pubis rami   Hyperlipemia    Hypertension    Hypertensive retinopathy    OU   Hypothyroidism    Insomnia    Macular degeneration    Exu OS   Multilevel degenerative disc disease    Palpitations 05/04/2013   saw cardiologist in 2015, he deleted A. Fib from the record as per note not documentation to support   Premature atrial contractions    PSVT (paroxysmal supraventricular tachycardia) (HCC)    a. short runs by monitor in 2021.   Short-term memory loss    Vitamin D  deficiency    Past Surgical History:  Procedure Laterality Date   ABDOMINAL HYSTERECTOMY  1971   BLADDER SURGERY  2010   Broken Pelvis  2015   C-EYE SURGERY PROCEDURE Bilateral    zaldivar   CATARACT EXTRACTION Bilateral    Done in [place]    EYE SURGERY Bilateral    Cat Sx   HIP PINNING,CANNULATED Right 10/07/2013   Procedure: CANNULATED screws right hip;  Surgeon: Arnie Lao, MD;  Location: [facility];  Service: Orthopedics;  Laterality: Right;   FAMILY HISTORY Family History  Problem Relation Age of Onset   Prostate cancer Father    Heart attack Sister    Cancer Brother    Cancer Sister    Heart attack Sister    Cancer Sister    Stroke Daughter    Dementia Neg Hx    Alzheimer's disease Neg Hx    SOCIAL HISTORY Social History   Tobacco Use   Smoking status: Former    Current packs/day: 0.00    Types: Cigarettes    Quit date: 08/02/1970    Years since quitting: 52.9   Smokeless tobacco: Never   Tobacco comments:    smoked very little  Vaping Use   Vaping status: Never Used  Substance Use Topics   Alcohol  use: Never    Alcohol /week: 0.0 standard drinks of alcohol    Drug use: Never       OPHTHALMIC EXAM:  Base  Eye Exam     Visual Acuity (Snellen - Linear)       Right Left   Dist [place] 20/60 -2 20/60   Dist ph [place] NI (too shaky NI (too shaky         Tonometry (Tonopen, 12:51 PM)       Right Left   Pressure 7 8         Pupils       Pupils Dark Light Shape React APD   Right  PERRL 2 1 Round Minimal None   Left PERRL 2 1 Round Minimal None         Visual Fields       Left Right    Full Full         Extraocular Movement       Right Left    Full, Ortho Full, Ortho         Neuro/Psych     Oriented x3: Yes   Mood/Affect: Normal         Dilation     Both eyes: 1.0% Mydriacyl, 2.5% Phenylephrine @ 12:52 PM           Slit Lamp and Fundus Exam     Slit Lamp Exam       Right Left   Lids/Lashes Dermatochalasis - upper lid Dermatochalasis - upper lid   Conjunctiva/Sclera White and quiet White and quiet   Cornea Arcus, 1+ Punctate epithelial erosions Arcus, 2-3+ Punctate epithelial erosions, EBMD, irregular epi   Anterior Chamber deep and clear deep and clear   Iris Round and moderately dilated Round and moderately dilated to 5mm, mild iridodonesis   Lens PC IOL in good position with open PC, anterior capsular phimosis PC IOL in good position with open PC   Anterior Vitreous Vitreous syneresis, Posterior vitreous detachment Vitreous syneresis; PVD; vitreous condensations         Fundus Exam       Right Left   Disc Mild temporal Pallor, Peripapillary atrophy, Sharp rim, Compact mild tilt, 2+ temporal Pallor, Peripapillary atrophy, Compact, Sharp rim   C/D Ratio 0.1 0.0   Macula Flat, Blunted foveal reflex, mild Epiretinal membrane, drusen, RPE mottling and clumping, No heme, trace cystic changes centrally -- slightly increased Flat, Blunted foveal reflex, RPE mottling and clumping, Epiretinal membrane, trace cystic changes -- stably improved, No heme   Vessels attenuated, mild tortuosity attenuated, Tortuous   Periphery IT schisis cavity from 0700-0830;  otherwise attached; stable improvement in [facility] and exudation under schisis cavities [facility] not red and pigmenting in), exudation resolved, focal pigmented CR scar at 0630; no red heme temporal sub-retinal hemorrhage improved - now pigmented CR atrophy;  No SRF -- completely resolved, shallow schisis IT quad, No heme            IMAGING AND PROCEDURES  Imaging and Procedures for @TODAY @  OCT, Retina - OU - Both Eyes       Right Eye Quality was good. Central Foveal Thickness: 361. Progression has worsened. Findings include normal foveal contour, no IRF, no SRF, retinal drusen , epiretinal membrane, macular pucker, pigment epithelial detachment (Persistent ERM with trace cystic changes inferior to fovea, stable collapse of PEDs inferior to fovea, bullous schisis IT quad caught on widefield - stable).   Left Eye Quality was good. Central Foveal Thickness: 298. Progression has been stable. Findings include normal foveal contour, no SRF, abnormal foveal contour, retinal drusen , epiretinal membrane, preretinal fibrosis (Stable improvement in central cystic changes, persistent ERM with PRF).   Notes *Images captured and stored on drive  Diagnosis / Impression:  OD: Persistent ERM with trace cystic changes inferior to fovea, stable collapse of PEDs inferior to fovea, bullous schisis IT quad caught on widefield - stable OS: stable improvement in central cystic changes, persistent ERM with PRF  Clinical management:  See below  Abbreviations: NFP - Normal foveal profile. CME - cystoid macular edema. PED - pigment epithelial detachment. IRF - intraretinal fluid. SRF - subretinal fluid. EZ -  ellipsoid zone. ERM - epiretinal membrane. ORA - outer retinal atrophy. ORT - outer retinal tubulation. SRHM - subretinal hyper-reflective material      Intravitreal Injection, Pharmacologic Agent - OD - Right Eye       Time Out 06/21/2023. 1:27 PM. Confirmed correct patient, procedure, site, and patient  consented.   Anesthesia Topical anesthesia was used. Anesthetic medications included Lidocaine  2%, Proparacaine 0.5%.   Procedure Preparation included 5% betadine to ocular surface, eyelid speculum. A (32g) needle was used.   Injection: 2 mg aflibercept  2 MG/0.05ML   Route: Intravitreal, Site: Right Eye   NDC: D2246706, Lot: [id number], Expiration date: 09/02/2024, Waste: 0 mL   Post-op Post injection exam found visual acuity of at least counting fingers. The patient tolerated the procedure well. There were no complications. The patient received written and verbal post procedure care education. Post injection medications were not given.      Intravitreal Injection, Pharmacologic Agent - OS - Left Eye       Time Out 06/21/2023. 1:27 PM. Confirmed correct patient, procedure, site, and patient consented.   Anesthesia Topical anesthesia was used. Anesthetic medications included Lidocaine  2%, Proparacaine 0.5%.   Procedure Preparation included 5% betadine to ocular surface, eyelid speculum. A supplied (32g) needle was used.   Injection: 6 mg faricimab -svoa 6 MG/0.05ML   Route: Intravitreal, Site: Left Eye   NDC: 09811-914-78, Lot: [id number], Expiration date: 05/02/2024, Waste: 0 mL   Post-op Post injection exam found visual acuity of at least counting fingers. The patient tolerated the procedure well. There were no complications. The patient received written and verbal post procedure care education. Post injection medications were not given.             ASSESSMENT/PLAN:    ICD-10-CM   1. Exudative age-related macular degeneration of both eyes with active choroidal neovascularization (HCC)  H35.3231 OCT, Retina - OU - Both Eyes    Intravitreal Injection, Pharmacologic Agent - OD - Right Eye    Intravitreal Injection, Pharmacologic Agent - OS - Left Eye    faricimab -svoa (VABYSMO ) 6mg /0.64mL intravitreal injection    aflibercept  (EYLEA ) SOLN 2 mg    2. Subretinal  hemorrhage of both eyes  H35.63     3. Serous retinal detachment of left eye  H33.22     4. 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 Essential hypertension  I10     7. Hypertensive retinopathy of both eyes  H35.033     8. Pseudophakia of both eyes  Z96.1       1-3. Exudative age related macular degeneration / PECHR OU - interval conversion to exu ARMD OD noted on 11.18.22 exam -- new SRH and exudates IT periphery  - initially presented with symptomatic floaters OS - initial exam showed large peripheral temporal subretinal hemorrhage OS -- spanning 2-430 -- also mild vitreous opacities and condensations that were causing symptomatic floaters  - B-scan (9.18.20) showed hyperreflective, ill-defined mass, 0300 periphery OS - was seen by Dr. Eliot Guernsey, Ocular Oncologist, on 9.22.2020 -- no tumor on exam or b-scan  - s/p IVA OS #1 (08.05.20) for subretinal hemorrhage, peripheral CNV - s/p IVA OD #1 (11.18.22), #2 (12.16.22), #3 (01.13.23), #4 (02.10.23), #5 (03.24.23) ======================== - s/p IVE OD #1 (05.08.23), #2 (06.21.23), #3 (08.16.23), #4 (02.27.24), #5 (06.05.24), #6 (10.10.24), #7 (01.08.25), #8 (02.26.25) - s/p IVE OS #1 (09.02.20) - sample, #2 (10.12.20), #3 (11.11.20), #4 (12.09.20), #  5 (1.20.21), #6 (02.17.21), #7 (03.19.21), #8 (04.30.21), #9 (06.16.21), #10 (08.11.21), #11 (12.01.21), #12 (01.26.22), #13 (03.30.22), #14 (06.01.22), #15 (08.10.22), #16 (09.23.22), #17 (11.18.22), #18 (02.10.23), #19 (03.24.23), #20 (05.08.23), #21 (06.21.23), #22 (08.16.23), #23 (10.25.23), #24 (12.20.23), #25 (02.27.24) ==========================  - s/p IVV OS #1 (04.24.24), #2 (06.05.24), #3 (07.17.24), #4 (08.28.24), #5 (10.10.24), #6 (11.20.24), #7 (01.08.25), #8 (02.26.25), #9 (04.23.25)  ==========================  - today BCVA 20/60 OU -- decreased OU  - exam shows peripheral SRH stably improved OU - OCT shows OD: Persistent ERM  with trace cystic changes inferior to fovea, stable collapse of PEDs inferior to fovea, bullous schisis IT quad caught on widefield - stable; OS: stable improvement in central cystic changes, persistent ERM with PRF at 8 weeks  - recommend IVE OD #9 and IVV OS #10 today, 06.18.25 w/ f/u ext to 9-10 wks   - working plan for OD is possibly injection every other visit (~q3-4 mos)  - pt in agreement and wishes to proceed with injection OS  - RBA of procedure discussed, questions answered   - see procedure note  - IVV informed consent obtained and signed 04.24.24 (OS)  - Eylea[employer] Benefits investigation initiated 09.02.2020 -- approved for 2024 - f/u 9-10 weeks, sooner prn, OCT -- scan through IT schisis OD, DFE, possible injections  4. Epiretinal membrane, OU.  - mild ERM OU  - asymptomatic, no metamorphopsia  - no indication for surgery at this time  - continue to monitor  5. Retinoschisis OD -- stable - schisis cavity, IT quadrant, from 0700-0830, -- ?less posterior extension ?collapsing  - no associated RT/RD but now with [facility] and exudates -- improving   - discussed findings and prognosis  - continue to monitor  6,7. Hypertensive retinopathy OU  - discussed importance of tight BP control  - continue to monitor  8. Pseudophakia OU  - s/p CE/IOL OU  - doing well  - continue to monitor  Ophthalmic Meds Ordered this visit:  Meds ordered this encounter  Medications   faricimab -svoa (VABYSMO ) 6mg /0.75mL intravitreal injection   aflibercept  (EYLEA ) SOLN 2 mg     Return for f/u 9-10 weeks, exu ARMD OU, DFE, OCT, Possible Injxn.  There are no Patient Instructions on file for this visit.   Explained the diagnoses, plan, and follow up with the patient and they expressed understanding.  Patient expressed understanding of the importance of proper follow up care.  This document serves as a record of services personally performed by Jeanice Millard, MD, PhD. It was created on their behalf  by Angelia Kelp, an ophthalmic technician. The creation of this record is the provider's dictation and/or activities during the visit.    Electronically signed by: Angelia Kelp, OA, 06/21/23  2:01 PM  This document serves as a record of services personally performed by Jeanice Millard, MD, PhD. It was created on their behalf by Morley Arabia. Bevin Bucks, OA an ophthalmic technician. The creation of this record is the provider's dictation and/or activities during the visit.    Electronically signed by: Morley Arabia. Bevin Bucks, OA 06/21/23 2:01 PM  Jeanice Millard, M.D., Ph.D. Diseases & Surgery of the Retina and Vitreous [facility]  I have reviewed the above documentation for accuracy and completeness, and I agree with the above. Jeanice Millard, M.D., Ph.D. 06/21/23 2:02 PM   Abbreviations: M myopia (nearsighted); A astigmatism; H hyperopia (farsighted); P presbyopia; Mrx spectacle prescription;  CTL contact lenses; OD right eye; OS  left eye; OU both eyes  XT exotropia; ET esotropia; PEK punctate epithelial keratitis; PEE punctate epithelial erosions; DES dry eye syndrome; MGD meibomian gland dysfunction; ATs artificial tears; PFAT's preservative free artificial tears; NSC nuclear sclerotic cataract; PSC posterior subcapsular cataract; ERM epi-retinal membrane; PVD posterior vitreous detachment; RD retinal detachment; DM diabetes mellitus; DR diabetic retinopathy; NPDR non-proliferative diabetic retinopathy; PDR proliferative diabetic retinopathy; CSME clinically significant macular edema; DME diabetic macular edema; dbh dot blot hemorrhages; CWS cotton wool spot; POAG primary open angle glaucoma; C/D cup-to-disc ratio; HVF humphrey visual field; GVF goldmann visual field; OCT optical coherence tomography; IOP intraocular pressure; BRVO Branch retinal vein occlusion; CRVO central retinal vein occlusion; CRAO central retinal artery occlusion; BRAO branch retinal artery occlusion; RT  retinal tear; SB scleral buckle; PPV pars plana vitrectomy; VH Vitreous hemorrhage; PRP panretinal laser photocoagulation; IVK intravitreal kenalog; VMT vitreomacular traction; MH Macular hole;  NVD neovascularization of the disc; NVE neovascularization elsewhere; AREDS age related eye disease study; ARMD age related macular degeneration; POAG primary open angle glaucoma; EBMD epithelial/anterior basement membrane dystrophy; ACIOL anterior chamber intraocular lens; IOL intraocular lens; PCIOL posterior chamber intraocular lens; Phaco/IOL phacoemulsification with intraocular lens placement; PRK photorefractive keratectomy; LASIK laser assisted in situ keratomileusis; HTN hypertension; DM diabetes mellitus; COPD chronic obstructive pulmonary disease

## 2023-06-16 ENCOUNTER — Other Ambulatory Visit: Payer: Self-pay

## 2023-06-16 ENCOUNTER — Emergency Department (HOSPITAL_COMMUNITY)
Admission: EM | Admit: 2023-06-16 | Discharge: 2023-06-16 | Disposition: A | Discharge status: Skilled Nursing Facility | Attending: Emergency Medicine | Admitting: Emergency Medicine

## 2023-06-16 DIAGNOSIS — R55 Syncope and collapse: Secondary | ICD-10-CM | POA: Insufficient documentation

## 2023-06-16 LAB — CBC WITH DIFFERENTIAL/PLATELET
Abs Immature Granulocytes: 0.07 10*3/uL (ref 0.00–0.07)
Basophils Absolute: 0 10*3/uL (ref 0.0–0.1)
Basophils Relative: 0 %
Eosinophils Absolute: 0.1 10*3/uL (ref 0.0–0.5)
Eosinophils Relative: 1 %
HCT: 36.5 % (ref 36.0–46.0)
Hemoglobin: 11.9 g/dL — ABNORMAL LOW (ref 12.0–15.0)
Immature Granulocytes: 1 %
Lymphocytes Relative: 6 %
Lymphs Abs: 0.7 10*3/uL (ref 0.7–4.0)
MCH: 29 pg (ref 26.0–34.0)
MCHC: 32.6 g/dL (ref 30.0–36.0)
MCV: 88.8 fL (ref 80.0–100.0)
Monocytes Absolute: 0.9 10*3/uL (ref 0.1–1.0)
Monocytes Relative: 8 %
Neutro Abs: 9.5 10*3/uL — ABNORMAL HIGH (ref 1.7–7.7)
Neutrophils Relative %: 84 %
Platelets: 237 10*3/uL (ref 150–400)
RBC: 4.11 MIL/uL (ref 3.87–5.11)
RDW: 13.6 % (ref 11.5–15.5)
WBC: 11.3 10*3/uL — ABNORMAL HIGH (ref 4.0–10.5)
nRBC: 0 % (ref 0.0–0.2)

## 2023-06-16 LAB — COMPREHENSIVE METABOLIC PANEL WITH GFR
ALT: 12 U/L (ref 0–44)
AST: 20 U/L (ref 15–41)
Albumin: 3.9 g/dL (ref 3.5–5.0)
Alkaline Phosphatase: 50 U/L (ref 38–126)
Anion gap: 11 (ref 5–15)
BUN: 15 mg/dL (ref 8–23)
CO2: 24 mmol/L (ref 22–32)
Calcium: 9.5 mg/dL (ref 8.9–10.3)
Chloride: 101 mmol/L (ref 98–111)
Creatinine, Ser: 1.18 mg/dL — ABNORMAL HIGH (ref 0.44–1.00)
GFR, Estimated: 43 mL/min — ABNORMAL LOW (ref >60)
Glucose, Bld: 102 mg/dL — ABNORMAL HIGH (ref 70–99)
Potassium: 4.3 mmol/L (ref 3.5–5.1)
Sodium: 136 mmol/L (ref 135–145)
Total Bilirubin: 0.7 mg/dL (ref 0.0–1.2)
Total Protein: 7.1 g/dL (ref 6.5–8.1)

## 2023-06-16 LAB — TROPONIN I (HIGH SENSITIVITY): Troponin I (High Sensitivity): 10 ng/L (ref <18)

## 2023-06-16 NOTE — Discharge Instructions (Signed)
 Please return if she has any new or worsening symptoms.  Please have her followed up by her primary doctor as soon as possible

## 2023-06-16 NOTE — ED Triage Notes (Signed)
 Pt brought from [facility] for syncopal episode. Family states that pt was standing behind pt when she collapsed in their arms. No reports of hitting her head. Pt does not recall what happened. Pt is alert and oriented x4. No signs of injury or distress.

## 2023-06-16 NOTE — ED Provider Notes (Signed)
 [place] EMERGENCY DEPARTMENT AT [facility] Provider Note   CSN: [id number] Arrival date & time: 06/16/23  4098     Patient presents with: Loss of Consciousness   Tracey Rosario is a 89 y.o. female.   HPI   89 year old female who had syncopal episode today.  Daughter gives history.  Daughter was with her when she was sitting on a stool and she became unresponsive.  She thinks that this lasted several minutes.  No report of cardiac or respiratory arrest.  She regained consciousness and was back to baseline.  She is in a memory unit.  No reports of head injury, headache, chest pain, shortness of breath, abdominal pain, nausea, vomiting, or diarrhea.  She has been at baseline  Prior to Admission medications   Medication Sig Start Date End Date Taking? Authorizing Provider  albuterol  (VENTOLIN  HFA) 108 (90 Base) MCG/ACT inhaler Inhale 2 puffs into the lungs every 6 (six) hours as needed for wheezing or shortness of breath. 12/25/21   Singh, Prashant K, MD  amLODipine  (NORVASC ) 10 MG tablet Take 10 mg by mouth daily. 12/05/19   [provider]  Bromfenac  Sodium (PROLENSA ) 0.07 % SOLN Place 1 drop into the left eye in the morning and at bedtime. 09/23/22   Ronelle Coffee, MD  Cholecalciferol  (VITAMIN D3) 125 MCG (5000 UT) TABS Take 5,000 Units by mouth daily.     [provider]  citalopram  (CELEXA ) 20 MG tablet Take 20 mg by mouth daily. 04/29/19   [provider]  donepezil  (ARICEPT ) 23 MG TABS tablet Take 23 mg by mouth daily. 11/22/21   [provider]  ferrous gluconate  (FERGON) 324 MG tablet Take 324 mg by mouth daily with breakfast. 04/29/19   [provider]  furosemide  (LASIX ) 20 MG tablet Take 1 tablet (20 mg total) by mouth daily. 01/14/22 01/14/23  Pokhrel, Amador Bad, MD  gabapentin  (NEURONTIN ) 300 MG capsule Take 300 mg by mouth 3 (three) times daily as needed (for neuropathic pain). 06/05/20   [provider]   levothyroxine  (SYNTHROID ) 75 MCG tablet Take 75 mcg by mouth daily before breakfast. 03/04/20   [provider]  lisinopril  (ZESTRIL ) 10 MG tablet Take 10 mg by mouth daily. 12/02/20   [provider]  memantine  (NAMENDA ) 5 MG tablet TAKE 1 TABLET BY MOUTH EVERYDAY AT BEDTIME Patient taking differently: Take 5 mg by mouth at bedtime. 12/10/21   Wertman, Sara E, PA-C  Nystatin (GERHARDT'S BUTT CREAM) CREA Apply 1 Application topically daily. 01/14/22   Pokhrel, Amador Bad, MD  omeprazole  (PRILOSEC) 20 MG capsule TAKE 1 CAPSULE DAILY Patient taking differently: Take 20 mg by mouth daily before breakfast. 10/20/17   Maurie Southern, DO  ondansetron  (ZOFRAN ) 4 MG tablet Take 1 tablet (4 mg total) by mouth every 6 (six) hours as needed for nausea or vomiting. 01/14/22   Pokhrel, Amador Bad, MD  oxybutynin  (DITROPAN -XL) 10 MG 24 hr tablet Take 10 mg by mouth daily. 10/25/21   [provider]  oxybutynin  (DITROPAN -XL) 5 MG 24 hr tablet Take 5 mg by mouth daily. 12/14/20   [provider]  polyvinyl alcohol  (LIQUIFILM TEARS) 1.4 % ophthalmic solution Place 1 drop into both eyes as needed for dry eyes.    [provider]  prednisoLONE  acetate (PRED FORTE ) 1 % ophthalmic suspension Place 1 drop into the left eye in the morning and at bedtime. 09/23/22   Ronelle Coffee, MD  Probiotic Product (PROBIOTIC PO) Take 1 capsule by mouth daily  after breakfast.    [provider]    Allergies: Codeine    Review of Systems  Updated Vital Signs BP (!) 165/47 (BP Location: Right Arm)   Pulse (!) 57   Temp 98.7 F (37.1 C) (Oral)   Resp 19   Ht 1.803 m (5' 11)   Wt 71 kg   SpO2 96%   BMI 21.83 kg/m   Physical Exam Vitals reviewed.  HENT:     Head: Normocephalic.     Right Ear: External ear normal.     Left Ear: External ear normal.     Nose: Nose normal.     Mouth/Throat:     Pharynx: Oropharynx is clear.   Eyes:     Pupils: Pupils are equal, round, and  reactive to light.    Cardiovascular:     Rate and Rhythm: Normal rate and regular rhythm.     Pulses: Normal pulses.  Pulmonary:     Effort: Pulmonary effort is normal.     Breath sounds: Normal breath sounds.  Abdominal:     General: Abdomen is flat.     Palpations: Abdomen is soft.   Musculoskeletal:        General: Normal range of motion.     Cervical back: Normal range of motion.   Skin:    General: Skin is warm and dry.     Capillary Refill: Capillary refill takes less than 2 seconds.   Neurological:     General: No focal deficit present.     Mental Status: She is alert.     Cranial Nerves: No cranial nerve deficit.     Sensory: No sensory deficit.     Motor: No weakness.     Coordination: Coordination normal.     Comments: Patient is alert and oriented but not to date.  No focal neurological deficits are noted  Psychiatric:        Mood and Affect: Mood normal.        Behavior: Behavior normal.     (all labs ordered are listed, but only abnormal results are displayed) Labs Reviewed  CBC WITH DIFFERENTIAL/PLATELET - Abnormal; Notable for the following components:      Result Value   WBC 11.3 (*)    Hemoglobin 11.9 (*)    Neutro Abs 9.5 (*)    All other components within normal limits  COMPREHENSIVE METABOLIC PANEL WITH GFR - Abnormal; Notable for the following components:   Glucose, Bld 102 (*)    Creatinine, Ser 1.18 (*)    GFR, Estimated 43 (*)    All other components within normal limits  TROPONIN I (HIGH SENSITIVITY)  TROPONIN I (HIGH SENSITIVITY)    EKG: EKG Interpretation Date/Time:  Friday June 16 2023 09:33:21 EDT Ventricular Rate:  56 PR Interval:    QRS Duration:  97 QT Interval:  466 QTC Calculation: 450 R Axis:   20  Text Interpretation: Normal sinus rhythm Low voltage, precordial leads RSR' in V1 or V2, probably normal variant Confirmed by Auston Blush [phone]) on 06/16/2023 9:44:37 AM  Radiology: No results found.   Procedures    Medications Ordered in the ED - No data to display  Clinical Course as of 06/16/23 1317  Fri Jun 16, 2023  1131 [facility] reviewed interpreted mild leukocytosis otherwise within normal limits [DR]  1316 BC reviewed interpreted [facility] Pleat metabolic panel reviewed interpreted and normal Troponin negative EKG [DR]    Clinical Course User Index [DR] Auston Blush, MD  Medical Decision Making Amount and/or Complexity of Data Reviewed Labs: ordered.   89 year old female presents today with episode of unresponsiveness.  Patient evaluated here in the ED with CBC and electrolytes.  No evidence of anemia, acute electrolyte abnormality, or hypoglycemia. Discussed risks of arrhythmia with patient and daughter.  Discussed risks of hospitalization in this patient with dementia.  Daughter opts to take patient back to facility.  We have discussed return precautions and need for close follow-up and she voices understanding.    Final diagnoses:  Syncope, unspecified syncope type    ED Discharge Orders     None          Auston Blush, MD 06/16/23 1317

## 2023-06-21 ENCOUNTER — Encounter (INDEPENDENT_AMBULATORY_CARE_PROVIDER_SITE_OTHER): Payer: Self-pay | Admitting: Ophthalmology

## 2023-06-21 ENCOUNTER — Ambulatory Visit (INDEPENDENT_AMBULATORY_CARE_PROVIDER_SITE_OTHER): Admitting: Ophthalmology

## 2023-06-21 DIAGNOSIS — H353231 Exudative age-related macular degeneration, bilateral, with active choroidal neovascularization: Secondary | ICD-10-CM

## 2023-06-21 DIAGNOSIS — H3322 Serous retinal detachment, left eye: Secondary | ICD-10-CM

## 2023-06-21 DIAGNOSIS — H3563 Retinal hemorrhage, bilateral: Secondary | ICD-10-CM

## 2023-06-21 DIAGNOSIS — H35033 Hypertensive retinopathy, bilateral: Secondary | ICD-10-CM

## 2023-06-21 DIAGNOSIS — I10 Essential (primary) hypertension: Secondary | ICD-10-CM

## 2023-06-21 DIAGNOSIS — H35373 Puckering of macula, bilateral: Secondary | ICD-10-CM | POA: Diagnosis not present

## 2023-06-21 DIAGNOSIS — H33101 Unspecified retinoschisis, right eye: Secondary | ICD-10-CM

## 2023-06-21 DIAGNOSIS — Z961 Presence of intraocular lens: Secondary | ICD-10-CM

## 2023-06-21 MED ORDER — FARICIMAB-SVOA 6 MG/0.05ML IZ SOSY
6.0000 mg | PREFILLED_SYRINGE | INTRAVITREAL | Status: AC | PRN
Start: 2023-06-21 — End: 2023-06-21
  Administered 2023-06-21: 6 mg via INTRAVITREAL

## 2023-06-21 MED ORDER — AFLIBERCEPT 2MG/0.05ML IZ SOLN FOR KALEIDOSCOPE
2.0000 mg | INTRAVITREAL | Status: AC | PRN
  Administered 2023-06-21: 2 mg via INTRAVITREAL

## 2023-08-17 NOTE — Progress Notes (Shared)
 [facility] - Clinic Note  08/23/2023     CHIEF COMPLAINT Patient presents for No chief complaint on file.  HISTORY OF PRESENT ILLNESS: Tracey Rosario is a 89 y.o. female who presents to the clinic today for:     Pt states she is seeing a whole lot   Referring physician: No referring provider defined for this encounter.  HISTORICAL INFORMATION:   Selected notes from the MEDICAL RECORD NUMBER Referred by Dr. Medford Ferrier for retinal eval   CURRENT MEDICATIONS: Current Outpatient Medications (Ophthalmic Drugs)  Medication Sig   Bromfenac  Sodium (PROLENSA ) 0.07 % SOLN Place 1 drop into the left eye in the morning and at bedtime. (Patient not taking: Reported on 06/21/2023)   polyvinyl alcohol  (LIQUIFILM TEARS) 1.4 % ophthalmic solution Place 1 drop into both eyes as needed for dry eyes.   prednisoLONE  acetate (PRED FORTE ) 1 % ophthalmic suspension Place 1 drop into the left eye in the morning and at bedtime. (Patient not taking: Reported on 06/21/2023)   No current facility-administered medications for this visit. (Ophthalmic Drugs)   Current Outpatient Medications (Other)  Medication Sig   albuterol  (VENTOLIN  HFA) 108 (90 Base) MCG/ACT inhaler Inhale 2 puffs into the lungs every 6 (six) hours as needed for wheezing or shortness of breath.   amLODipine  (NORVASC ) 10 MG tablet Take 10 mg by mouth daily.   Cholecalciferol  (VITAMIN D3) 125 MCG (5000 UT) TABS Take 5,000 Units by mouth daily.    citalopram  (CELEXA ) 20 MG tablet Take 20 mg by mouth daily.   donepezil  (ARICEPT ) 23 MG TABS tablet Take 23 mg by mouth daily.   ferrous gluconate  (FERGON) 324 MG tablet Take 324 mg by mouth daily with breakfast.   furosemide  (LASIX ) 20 MG tablet Take 1 tablet (20 mg total) by mouth daily. (Patient not taking: Reported on 06/21/2023)   gabapentin  (NEURONTIN ) 300 MG capsule Take 300 mg by mouth 3 (three) times daily as needed (for neuropathic pain).    levothyroxine  (SYNTHROID ) 75 MCG tablet Take 75 mcg by mouth daily before breakfast.   lisinopril  (ZESTRIL ) 10 MG tablet Take 10 mg by mouth daily.   memantine  (NAMENDA ) 5 MG tablet TAKE 1 TABLET BY MOUTH EVERYDAY AT BEDTIME (Patient taking differently: Take 5 mg by mouth at bedtime.)   Nystatin (GERHARDT'S BUTT CREAM) CREA Apply 1 Application topically daily.   omeprazole  (PRILOSEC) 20 MG capsule TAKE 1 CAPSULE DAILY (Patient taking differently: Take 20 mg by mouth daily before breakfast.)   ondansetron  (ZOFRAN ) 4 MG tablet Take 1 tablet (4 mg total) by mouth every 6 (six) hours as needed for nausea or vomiting.   oxybutynin  (DITROPAN -XL) 10 MG 24 hr tablet Take 10 mg by mouth daily.   oxybutynin  (DITROPAN -XL) 5 MG 24 hr tablet Take 5 mg by mouth daily.   Probiotic Product (PROBIOTIC PO) Take 1 capsule by mouth daily after breakfast.   No current facility-administered medications for this visit. (Other)   REVIEW OF SYSTEMS:     ALLERGIES Allergies  Allergen Reactions   Codeine Other (See Comments)    Altered mental status   PAST MEDICAL HISTORY Past Medical History:  Diagnosis Date   Acid reflux    Anemia    Arthropathy    Bradycardia    beta blocker reduced in 2021 due to this   CKD (chronic kidney disease), stage III (HCC)    Cognitive decline    referred to neurologist   Depression 10/07/2013   daughter didn't  know anything about this   Dizziness    Frequent PVCs    GERD (gastroesophageal reflux disease) 05/04/2013   Hip fx (HCC)    r hip, bilat pubis rami   Hyperlipemia    Hypertension    Hypertensive retinopathy    OU   Hypothyroidism    Insomnia    Macular degeneration    Exu OS   Multilevel degenerative disc disease    Palpitations 05/04/2013   saw cardiologist in 2015, he deleted A. Fib from the record as per note not documentation to support   Premature atrial contractions    PSVT (paroxysmal supraventricular tachycardia) (HCC)    a. short runs by monitor  in 2021.   Short-term memory loss    Vitamin D  deficiency    Past Surgical History:  Procedure Laterality Date   ABDOMINAL HYSTERECTOMY  1971   BLADDER SURGERY  2010   Broken Pelvis  2015   C-EYE SURGERY PROCEDURE Bilateral    zaldivar   CATARACT EXTRACTION Bilateral    Done in [place]    EYE SURGERY Bilateral    Cat Sx   HIP PINNING,CANNULATED Right 10/07/2013   Procedure: CANNULATED screws right hip;  Surgeon: Lonni CINDERELLA Poli, MD;  Location: [facility] OR;  Service: Orthopedics;  Laterality: Right;   FAMILY HISTORY Family History  Problem Relation Age of Onset   Prostate cancer Father    Heart attack Sister    Cancer Brother    Cancer Sister    Heart attack Sister    Cancer Sister    Stroke Daughter    Dementia Neg Hx    Alzheimer's disease Neg Hx    SOCIAL HISTORY Social History   Tobacco Use   Smoking status: Former    Current packs/day: 0.00    Types: Cigarettes    Quit date: 08/02/1970    Years since quitting: 53.0   Smokeless tobacco: Never   Tobacco comments:    smoked very little  Vaping Use   Vaping status: Never Used  Substance Use Topics   Alcohol  use: Never    Alcohol /week: 0.0 standard drinks of alcohol    Drug use: Never       OPHTHALMIC EXAM:  Not recorded     IMAGING AND PROCEDURES  Imaging and Procedures for @TODAY @           ASSESSMENT/PLAN:    ICD-10-CM   1. Exudative age-related macular degeneration of both eyes with active choroidal neovascularization (HCC)  H35.3231     2. Subretinal hemorrhage of both eyes  H35.63     3. Serous retinal detachment of left eye  H33.22     4. 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 Essential hypertension  I10     7. Hypertensive retinopathy of both eyes  H35.033     8. Pseudophakia of both eyes  Z96.1        1-3. Exudative age related macular degeneration / PECHR OU - interval conversion to exu ARMD OD noted on 11.18.22 exam  -- new SRH and exudates IT periphery  - initially presented with symptomatic floaters OS - initial exam showed large peripheral temporal subretinal hemorrhage OS -- spanning 2-430 -- also mild vitreous opacities and condensations that were causing symptomatic floaters  - B-scan (9.18.20) showed hyperreflective, ill-defined mass, 0300 periphery OS - was seen by Dr. Beatris, Ocular Oncologist, on 9.22.2020 -- no tumor on exam or b-scan  -  s/p IVA OS #1 (08.05.20) for subretinal hemorrhage, peripheral CNV - s/p IVA OD #1 (11.18.22), #2 (12.16.22), #3 (01.13.23), #4 (02.10.23), #5 (03.24.23) ======================== - s/p IVE OD #1 (05.08.23), #2 (06.21.23), #3 (08.16.23), #4 (02.27.24), #5 (06.05.24), #6 (10.10.24), #7 (01.08.25), #8 (02.26.25), #9 (06.18.25) - s/p IVE OS #1 (09.02.20) - sample, #2 (10.12.20), #3 (11.11.20), #4 (12.09.20), #5 (1.20.21), #6 (02.17.21), #7 (03.19.21), #8 (04.30.21), #9 (06.16.21), #10 (08.11.21), #11 (12.01.21), #12 (01.26.22), #13 (03.30.22), #14 (06.01.22), #15 (08.10.22), #16 (09.23.22), #17 (11.18.22), #18 (02.10.23), #19 (03.24.23), #20 (05.08.23), #21 (06.21.23), #22 (08.16.23), #23 (10.25.23), #24 (12.20.23), #25 (02.27.24) ==========================  - s/p IVV OS #1 (04.24.24), #2 (06.05.24), #3 (07.17.24), #4 (08.28.24), #5 (10.10.24), #6 (11.20.24), #7 (01.08.25), #8 (02.26.25), #9 (04.23.25), #10 (06.18.25)  ==========================  - today BCVA 20/60 OU -- decreased OU  - exam shows peripheral SRH stably improved OU - OCT shows OD: Persistent ERM with trace cystic changes inferior to fovea, stable collapse of PEDs inferior to fovea, bullous schisis IT quad caught on widefield - stable; OS: stable improvement in central cystic changes, persistent ERM with PRF at 8 weeks  - recommend IVE OD #10 and IVV OS #11 today, 08.20.25 w/ f/u ext to 9-10 wks   - working plan for OD is possibly injection every other visit (~q3-4 mos)  - pt in agreement and wishes to  proceed with injection OS  - RBA of procedure discussed, questions answered   - see procedure note  - IVV informed consent obtained and signed 04.24.24 (OS)  - [id number] Benefits investigation initiated 09.02.2020 -- approved for 2024 - f/u 9-10 weeks, sooner prn, OCT -- scan through IT schisis OD, DFE, possible injection(s)  4. Epiretinal membrane, OU.  - mild ERM OU  - asymptomatic, no metamorphopsia  - no indication for surgery at this time  - continue to monitor  5. Retinoschisis OD -- stable - schisis cavity, IT quadrant, from 0700-0830, -- ?less posterior extension ?collapsing  - no associated RT/RD but now with [facility] and exudates -- improving   - discussed findings and prognosis  - continue to monitor  6,7. Hypertensive retinopathy OU  - discussed importance of tight BP control  - continue to monitor  8. Pseudophakia OU  - s/p CE/IOL OU  - doing well  - continue to monitor  Ophthalmic Meds Ordered this visit:  No orders of the defined types were placed in this encounter.    No follow-ups on file.  There are no Patient Instructions on file for this visit.   Explained the diagnoses, plan, and follow up with the patient and they expressed understanding.  Patient expressed understanding of the importance of proper follow up care.  This document serves as a record of services personally performed by Redell JUDITHANN Hans, MD, PhD. It was created on their behalf by Avelina Pereyra, COA an ophthalmic technician. The creation of this record is the provider's dictation and/or activities during the visit.   Electronically signed by: Avelina GORMAN Pereyra, COT  08/17/23  2:24 PM    Redell JUDITHANN Hans, M.D., Ph.D. Diseases & Surgery of the Retina and Vitreous [facility]    Abbreviations: M myopia (nearsighted); A astigmatism; H hyperopia (farsighted); P presbyopia; Mrx spectacle prescription;  CTL contact lenses; OD right eye; OS left eye; OU both eyes  XT exotropia;  ET esotropia; PEK punctate epithelial keratitis; PEE punctate epithelial erosions; DES dry eye syndrome; MGD meibomian gland dysfunction; ATs artificial tears; PFAT's preservative free artificial  tears; NSC nuclear sclerotic cataract; PSC posterior subcapsular cataract; ERM epi-retinal membrane; PVD posterior vitreous detachment; RD retinal detachment; DM diabetes mellitus; DR diabetic retinopathy; NPDR non-proliferative diabetic retinopathy; PDR proliferative diabetic retinopathy; CSME clinically significant macular edema; DME diabetic macular edema; dbh dot blot hemorrhages; CWS cotton wool spot; POAG primary open angle glaucoma; C/D cup-to-disc ratio; HVF humphrey visual field; GVF goldmann visual field; OCT optical coherence tomography; IOP intraocular pressure; BRVO Branch retinal vein occlusion; CRVO central retinal vein occlusion; CRAO central retinal artery occlusion; BRAO branch retinal artery occlusion; RT retinal tear; SB scleral buckle; PPV pars plana vitrectomy; VH Vitreous hemorrhage; PRP panretinal laser photocoagulation; IVK intravitreal kenalog; VMT vitreomacular traction; MH Macular hole;  NVD neovascularization of the disc; NVE neovascularization elsewhere; AREDS age related eye disease study; ARMD age related macular degeneration; POAG primary open angle glaucoma; EBMD epithelial/anterior basement membrane dystrophy; ACIOL anterior chamber intraocular lens; IOL intraocular lens; PCIOL posterior chamber intraocular lens; Phaco/IOL phacoemulsification with intraocular lens placement; PRK photorefractive keratectomy; LASIK laser assisted in situ keratomileusis; HTN hypertension; DM diabetes mellitus; COPD chronic obstructive pulmonary disease

## 2023-08-22 ENCOUNTER — Inpatient Hospital Stay (HOSPITAL_COMMUNITY)
Admission: EM | Admit: 2023-08-22 | Discharge: 2023-08-25 | DRG: 309 | Disposition: A | Discharge status: Skilled Nursing Facility | Source: Skilled Nursing Facility | Attending: Internal Medicine | Admitting: Internal Medicine

## 2023-08-22 ENCOUNTER — Emergency Department (HOSPITAL_COMMUNITY)

## 2023-08-22 ENCOUNTER — Encounter (HOSPITAL_COMMUNITY): Payer: Self-pay

## 2023-08-22 ENCOUNTER — Other Ambulatory Visit: Payer: Self-pay

## 2023-08-22 DIAGNOSIS — Z823 Family history of stroke: Secondary | ICD-10-CM

## 2023-08-22 DIAGNOSIS — F0393 Unspecified dementia, unspecified severity, with mood disturbance: Secondary | ICD-10-CM | POA: Diagnosis present

## 2023-08-22 DIAGNOSIS — N1832 Chronic kidney disease, stage 3b: Secondary | ICD-10-CM | POA: Diagnosis present

## 2023-08-22 DIAGNOSIS — E039 Hypothyroidism, unspecified: Secondary | ICD-10-CM | POA: Diagnosis present

## 2023-08-22 DIAGNOSIS — Z79899 Other long term (current) drug therapy: Secondary | ICD-10-CM

## 2023-08-22 DIAGNOSIS — E785 Hyperlipidemia, unspecified: Secondary | ICD-10-CM | POA: Diagnosis present

## 2023-08-22 DIAGNOSIS — I4891 Unspecified atrial fibrillation: Secondary | ICD-10-CM | POA: Diagnosis present

## 2023-08-22 DIAGNOSIS — Z90710 Acquired absence of both cervix and uterus: Secondary | ICD-10-CM

## 2023-08-22 DIAGNOSIS — Z8042 Family history of malignant neoplasm of prostate: Secondary | ICD-10-CM

## 2023-08-22 DIAGNOSIS — I483 Typical atrial flutter: Secondary | ICD-10-CM | POA: Diagnosis not present

## 2023-08-22 DIAGNOSIS — R296 Repeated falls: Secondary | ICD-10-CM | POA: Diagnosis present

## 2023-08-22 DIAGNOSIS — S0101XA Laceration without foreign body of scalp, initial encounter: Secondary | ICD-10-CM | POA: Diagnosis present

## 2023-08-22 DIAGNOSIS — E559 Vitamin D deficiency, unspecified: Secondary | ICD-10-CM | POA: Diagnosis present

## 2023-08-22 DIAGNOSIS — D631 Anemia in chronic kidney disease: Secondary | ICD-10-CM | POA: Diagnosis present

## 2023-08-22 DIAGNOSIS — F32A Depression, unspecified: Secondary | ICD-10-CM | POA: Diagnosis present

## 2023-08-22 DIAGNOSIS — W19XXXA Unspecified fall, initial encounter: Secondary | ICD-10-CM | POA: Diagnosis not present

## 2023-08-22 DIAGNOSIS — Z9841 Cataract extraction status, right eye: Secondary | ICD-10-CM

## 2023-08-22 DIAGNOSIS — F0394 Unspecified dementia, unspecified severity, with anxiety: Secondary | ICD-10-CM | POA: Diagnosis present

## 2023-08-22 DIAGNOSIS — Z23 Encounter for immunization: Secondary | ICD-10-CM

## 2023-08-22 DIAGNOSIS — I495 Sick sinus syndrome: Secondary | ICD-10-CM | POA: Diagnosis present

## 2023-08-22 DIAGNOSIS — N179 Acute kidney failure, unspecified: Secondary | ICD-10-CM

## 2023-08-22 DIAGNOSIS — Z66 Do not resuscitate: Secondary | ICD-10-CM | POA: Diagnosis present

## 2023-08-22 DIAGNOSIS — Z79890 Hormone replacement therapy (postmenopausal): Secondary | ICD-10-CM

## 2023-08-22 DIAGNOSIS — I951 Orthostatic hypotension: Secondary | ICD-10-CM | POA: Diagnosis present

## 2023-08-22 DIAGNOSIS — Z8249 Family history of ischemic heart disease and other diseases of the circulatory system: Secondary | ICD-10-CM

## 2023-08-22 DIAGNOSIS — R55 Syncope and collapse: Secondary | ICD-10-CM | POA: Diagnosis not present

## 2023-08-22 DIAGNOSIS — Z87891 Personal history of nicotine dependence: Secondary | ICD-10-CM

## 2023-08-22 DIAGNOSIS — S8001XA Contusion of right knee, initial encounter: Secondary | ICD-10-CM | POA: Diagnosis present

## 2023-08-22 DIAGNOSIS — S0990XA Unspecified injury of head, initial encounter: Secondary | ICD-10-CM

## 2023-08-22 DIAGNOSIS — I129 Hypertensive chronic kidney disease with stage 1 through stage 4 chronic kidney disease, or unspecified chronic kidney disease: Secondary | ICD-10-CM | POA: Diagnosis present

## 2023-08-22 DIAGNOSIS — K219 Gastro-esophageal reflux disease without esophagitis: Secondary | ICD-10-CM | POA: Diagnosis present

## 2023-08-22 DIAGNOSIS — S0181XA Laceration without foreign body of other part of head, initial encounter: Secondary | ICD-10-CM | POA: Diagnosis present

## 2023-08-22 DIAGNOSIS — E860 Dehydration: Secondary | ICD-10-CM | POA: Diagnosis present

## 2023-08-22 DIAGNOSIS — I4589 Other specified conduction disorders: Secondary | ICD-10-CM | POA: Diagnosis present

## 2023-08-22 DIAGNOSIS — Z9842 Cataract extraction status, left eye: Secondary | ICD-10-CM

## 2023-08-22 DIAGNOSIS — W1830XA Fall on same level, unspecified, initial encounter: Secondary | ICD-10-CM | POA: Diagnosis present

## 2023-08-22 DIAGNOSIS — Z885 Allergy status to narcotic agent status: Secondary | ICD-10-CM

## 2023-08-22 LAB — CBC
HCT: 43.5 % (ref 36.0–46.0)
Hemoglobin: 14.3 g/dL (ref 12.0–15.0)
MCH: 29.1 pg (ref 26.0–34.0)
MCHC: 32.9 g/dL (ref 30.0–36.0)
MCV: 88.4 fL (ref 80.0–100.0)
Platelets: 251 K/uL (ref 150–400)
RBC: 4.92 MIL/uL (ref 3.87–5.11)
RDW: 13.4 % (ref 11.5–15.5)
WBC: 8.2 K/uL (ref 4.0–10.5)
nRBC: 0 % (ref 0.0–0.2)

## 2023-08-22 LAB — BASIC METABOLIC PANEL WITH GFR
Anion gap: 13 (ref 5–15)
BUN: 16 mg/dL (ref 8–23)
CO2: 24 mmol/L (ref 22–32)
Calcium: 10 mg/dL (ref 8.9–10.3)
Chloride: 103 mmol/L (ref 98–111)
Creatinine, Ser: 1.29 mg/dL — ABNORMAL HIGH (ref 0.44–1.00)
GFR, Estimated: 38 mL/min — ABNORMAL LOW (ref >60)
Glucose, Bld: 102 mg/dL — ABNORMAL HIGH (ref 70–99)
Potassium: 4.8 mmol/L (ref 3.5–5.1)
Sodium: 140 mmol/L (ref 135–145)

## 2023-08-22 LAB — TROPONIN I (HIGH SENSITIVITY)
Troponin I (High Sensitivity): 9 ng/L (ref <18)
Troponin I (High Sensitivity): 12 ng/L (ref <18)

## 2023-08-22 LAB — CK: Total CK: 57 U/L (ref 38–234)

## 2023-08-22 LAB — TSH: TSH: 3.153 u[IU]/mL (ref 0.350–4.500)

## 2023-08-22 MED ORDER — POLYETHYLENE GLYCOL 3350 17 G PO PACK: 17.0000 g | PACK | Freq: Every day | ORAL | Status: DC | PRN

## 2023-08-22 MED ORDER — LIDOCAINE-EPINEPHRINE-TETRACAINE (LET) TOPICAL GEL
3.0000 mL | Freq: Once | TOPICAL | Status: AC
  Administered 2023-08-22: 3 mL via TOPICAL
  Filled 2023-08-22: qty 3

## 2023-08-22 MED ORDER — ACETAMINOPHEN 325 MG PO TABS: 650.0000 mg | ORAL_TABLET | Freq: Four times a day (QID) | ORAL | Status: DC | PRN

## 2023-08-22 MED ORDER — ACETAMINOPHEN 650 MG RE SUPP: 650.0000 mg | Freq: Four times a day (QID) | RECTAL | Status: DC | PRN

## 2023-08-22 MED ORDER — TETANUS-DIPHTH-ACELL PERTUSSIS 5-2.5-18.5 LF-MCG/0.5 IM SUSY
0.5000 mL | PREFILLED_SYRINGE | Freq: Once | INTRAMUSCULAR | Status: AC
  Administered 2023-08-22: 0.5 mL via INTRAMUSCULAR
  Filled 2023-08-22: qty 0.5

## 2023-08-22 MED ORDER — ENOXAPARIN SODIUM 30 MG/0.3ML IJ SOSY
30.0000 mg | PREFILLED_SYRINGE | INTRAMUSCULAR | Status: DC
  Administered 2023-08-22 – 2023-08-24 (×3): 30 mg via SUBCUTANEOUS
  Filled 2023-08-22 (×3): qty 0.3

## 2023-08-22 MED ORDER — LIDOCAINE-EPINEPHRINE (PF) 2 %-1:200000 IJ SOLN
10.0000 mL | Freq: Once | INTRAMUSCULAR | Status: AC
  Administered 2023-08-22: 10 mL
  Filled 2023-08-22: qty 20

## 2023-08-22 MED ORDER — SODIUM CHLORIDE 0.9 % IV BOLUS: 500.0000 mL | Freq: Once | INTRAVENOUS | Status: DC

## 2023-08-22 MED ORDER — LACTATED RINGERS IV BOLUS
1000.0000 mL | Freq: Once | INTRAVENOUS | Status: AC
  Administered 2023-08-22: 1000 mL via INTRAVENOUS

## 2023-08-22 NOTE — ED Triage Notes (Signed)
 Pt to ED via [facility] from [facility] unit. Pt had unwittnessed fall, pt found laying on bathroom fall. Pt has lac to right side of head and a skin tear to left elbow, abrasion to right knee. Pt told nursing staff she felt dizzy, told EMS she was just going to get dressed but doesn't remember fall. Pt has bandage in place on head and c-collar in place on arrival. Pt did c/o hip pain, was laying on left hip when EMS arrived. Pt does not take blood thinners. A&Ox2, baseline for pt.  147/68 HR 64 95% Cbg 127

## 2023-08-22 NOTE — Hospital Course (Addendum)
 Recurrent Syncope/Fall due to Conduction Abnormality Atrial Flutter  Tachycardia-bradycardia syndrome  Patient presented after a fall at her memory care facility. There was no prodromal symptoms and she could not recall the exact events surrounding the fall. She had 2 similar episodes 2 months and 6 months ago. Echocardiogram 08/25/2023 showed EF of 65 to 70%, with no valvular abnormalities. EKG revealed atrial flutter with intermittent high AV blocks and pauses, with variable heart rates. Cardiology was consulted and she was started on oral amiodarone  100 mg BID with subsequent sinus conversion. Amiodarone  dose was then reduced to 50 mg once daily due to bradycardia, and without improvement.  On day of discharge, there were no dizziness, lightheadedness or recurrence of symptoms. Amiodarone  was discontinued, and she was put on 14-day heart monitor with instructions to check BP and HR twice daily, to be follow up with EP.   AKI  There is documented history of CKD stage III and baseline Cr is around 0.97. Bladder scan showed no retention and FENa was found to be 0.4%, indicating prerenal cause. This was likely due to decreased volume as her 2 previous ED visits of syncope showed similar pattern of elevated Cr. Patient was eating and drinking well. On day of discharge, Cr was improving towards baseline and there was no signs of dysuria, hematuria, urinary frequency or urgency.   Hypertension  Orthostatic Hypotension  Orthostatic vitals were positive without symptoms. Home lisinopril -hydrochlorothiazide  was held due to the AKI and amlodipine  was continued for hypertension. Will resume home meds and follow up outpatient.   Dementia  Patient has dementia at baseline and was donepezil  23 mg once a day and memantine   5mg  once daily. There was no acute concern. Follow up outpatient.   Hypothyroidism  TSH stable. Continue Levothyroxine  75 mcg. Follow up outpatient.  Anxiety/Depression Continue mirtazapine   7.5 mg daily. Follow up outpatient.  Anemia of Chronic Disease  Hgb stable. Continue ferrous sulfate  325 mg once daily. Follow up outpatient.

## 2023-08-22 NOTE — ED Provider Notes (Signed)
.  Laceration Repair  Date/Time: 08/22/2023 2:56 PM  Performed by: Neldon Hamp RAMAN, PA Authorized by: Neldon Hamp RAMAN, PA   Consent:    Consent obtained:  Verbal   Consent given by:  Parent and patient   Risks discussed:  Infection, pain, poor cosmetic result and poor wound healing Universal protocol:    Procedure explained and questions answered to patient or proxy's satisfaction: yes     Relevant documents present and verified: yes     Test results available: yes     Imaging studies available: yes     Required blood products, implants, devices, and special equipment available: yes     Site/side marked: yes     Immediately prior to procedure, a time out was called: yes     Patient identity confirmed:  Verbally with patient and arm band Laceration details:    Location:  Face   Face location:  Forehead (R forehead)   Length (cm):  3 Exploration:    Hemostasis achieved with:  LET and epinephrine    Imaging obtained comment:  CT   Imaging outcome: foreign body not noted     Wound extent: areolar tissue violated     Wound extent: fascia not violated, no foreign body and no signs of injury     Contaminated: no   Treatment:    Amount of cleaning:  Standard   Irrigation solution:  Sterile saline   Irrigation volume:  Copious   Visualized foreign bodies/material removed: no   Skin repair:    Repair method:  Sutures   Suture size:  5-0   Suture material:  Prolene   Suture technique:  Simple interrupted   Number of sutures:  5 Approximation:    Approximation:  Close Repair type:    Repair type:  Intermediate Post-procedure details:    Dressing:  Antibiotic ointment   Procedure completion:  Quinton Neldon Hamp RAMAN, PA 08/22/23 1457    Elnor Jayson LABOR, DO 08/28/23 [phone]

## 2023-08-22 NOTE — ED Provider Notes (Signed)
 [facility] 3E HF PCU Provider Note  CSN: [id number] Arrival date & time: 08/22/23 1002  Chief Complaint(s) Fall  HPI Tracey Rosario is a 89 y.o. female with past medical history as below, significant for CKD, HLD, HTN SVT, DNR who presents to the ED with complaint of syncope, head injury  Patient reports that this morning she was attempting to get dressed and she collapsed on the floor.  She believes that she had LOC but she is not entirely sure.  This was unwitnessed.  Patient denies any sort of prodrome prior to the episode.  Denies any chest pain or palpitations, dyspnea.  Patient reports she has a headache and her neck hurts following the fall, she is not anticoagulated, unsure of last tetanus shot.  She denies any significant pain to her extremities otherwise.  No vision or hearing changes, no chest pain or dyspnea, no nausea or vomiting.  Daughter at bedside provides some history, reports that they are seen in the ER months ago with syncopal episode they occurred while patient was sitting in a chair and became unresponsive.  Daughter reports that she has had a couple more episodes of loss of consciousness over the past couple months.  Does not follow with cardiology  Past Medical History Past Medical History:  Diagnosis Date   Acid reflux    Anemia    Arthropathy    Bradycardia    beta blocker reduced in 2021 due to this   CKD (chronic kidney disease), stage III (HCC)    Cognitive decline    referred to neurologist   Depression 10/07/2013   daughter didn't know anything about this   Dizziness    Frequent PVCs    GERD (gastroesophageal reflux disease) 05/04/2013   Hip fx (HCC)    r hip, bilat pubis rami   Hyperlipemia    Hypertension    Hypertensive retinopathy    OU   Hypothyroidism    Insomnia    Macular degeneration    Exu OS   Multilevel degenerative disc disease    Palpitations 05/04/2013   saw cardiologist in 2015, he deleted A. Fib from the  record as per note not documentation to support   Premature atrial contractions    PSVT (paroxysmal supraventricular tachycardia) (HCC)    a. short runs by monitor in 2021.   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   Head injury 08/23/2023   Syncope 08/23/2023   Syncope and collapse 08/22/2023   Hypokalemia 01/12/2022   Medication management 01/07/2022   Pneumonia of right lung due to infectious organism 01/07/2022   RSV infection 01/07/2022   Palliative care encounter 01/06/2022   Acute hypoxic respiratory failure (HCC) 01/06/2022   Goals of care, counseling/discussion 01/06/2022   DNR (do not resuscitate) 01/06/2022   Shortness of breath 01/06/2022   High risk medication use 01/06/2022   Agitation 01/06/2022   Sepsis with acute hypoxic respiratory failure without septic shock (HCC) 01/06/2022   Need for emotional support 01/06/2022   Counseling and coordination of care 01/06/2022   Severe sepsis (HCC) 01/05/2022   Toxic encephalopathy 12/23/2021   Bradycardia 04/22/2019   Age-related memory disorder 11/12/2018   Other osteoporosis without current pathological fracture 01/28/2016   Memory impairment 10/15/2015   Esophageal reflux 06/11/2015   Small vessel disease (HCC) 04/30/2015   Essential  hypertension 04/07/2015   Tremor 04/07/2015   Major depressive disorder with single episode, in full remission (HCC) 03/30/2015   Absolute anemia 03/30/2015   Atrial fibrillation (HCC) 10/16/2013   Asymptomatic PVCs 08/01/2013   Hyperlipidemia 06/04/2013   Home Medication(s) Prior to Admission medications   Medication Sig Start Date End Date Taking? Authorizing Provider  amLODipine  (NORVASC ) 5 MG tablet Take 5 mg by mouth daily.   Yes [provider]  Artificial Tear Solution (GENTEAL TEARS OP) Place 1 drop into both  eyes 3 (three) times daily as needed (dry eyes).   Yes [provider]  Cholecalciferol  (VITAMIN D3) 125 MCG (5000 UT) CAPS Take 1 capsule by mouth daily.   Yes [provider]  citalopram  (CELEXA ) 20 MG tablet Take 20 mg by mouth daily. 04/29/19  Yes [provider]  donepezil  (ARICEPT ) 23 MG TABS tablet Take 23 mg by mouth at bedtime. 11/22/21  Yes [provider]  ferrous sulfate  (FEROSUL) 325 (65 FE) MG tablet Take 325 mg by mouth daily.   Yes [provider]  furosemide  (LASIX ) 20 MG tablet Take 10 mg by mouth daily.   Yes [provider]  Lactobacillus Rhamnosus, GG, (CULTURELLE) CAPS Take 1 capsule by mouth daily.   Yes [provider]  levothyroxine  (SYNTHROID ) 75 MCG tablet Take 75 mcg by mouth daily before breakfast. 03/04/20  Yes [provider]  lisinopril  (ZESTRIL ) 5 MG tablet Take 5 mg by mouth daily.   Yes [provider]  loperamide (IMODIUM A-D) 2 MG tablet Take 2 mg by mouth every 6 (six) hours as needed for diarrhea or loose stools.   Yes [provider]  memantine  (NAMENDA ) 5 MG tablet TAKE 1 TABLET BY MOUTH EVERYDAY AT BEDTIME 12/10/21  Yes Wertman, Sara E, PA-C  mirtazapine  (REMERON ) 7.5 MG tablet Take 7.5 mg by mouth at bedtime.   Yes [provider]  oxybutynin  (DITROPAN -XL) 10 MG 24 hr tablet Take 10 mg by mouth daily. 10/25/21  Yes [provider]  Polyethyl Glycol-Propyl Glycol (SYSTANE ULTRA OP) Place 1 drop into both eyes 3 (three) times daily.   Yes [provider]  Zinc Oxide (BAZA PROTECT MOISTURE BARRIER) 12 % CREA Apply 1 application  topically daily.   Yes [provider]  albuterol  (VENTOLIN  HFA) 108 (90 Base) MCG/ACT inhaler Inhale 2 puffs into the lungs every 6 (six) hours as needed for wheezing or shortness of breath. Patient not taking: Reported on 08/22/2023 12/25/21   Singh, Prashant K, MD  gabapentin  (NEURONTIN ) 300 MG capsule Take 300 mg  by mouth 3 (three) times daily as needed (for neuropathic pain). Patient not taking: Reported on 08/22/2023 06/05/20   [provider]  Nystatin (GERHARDT'S BUTT CREAM) CREA Apply 1 Application topically daily. Patient not taking: Reported on 08/22/2023 01/14/22   Sonjia Held, MD  pantoprazole  (PROTONIX ) 40 MG tablet Take 1 tablet (40 mg total) by mouth daily. 08/26/23   Amilibia, Jaden, DO  Past Surgical History Past Surgical History:  Procedure Laterality Date   ABDOMINAL HYSTERECTOMY  1971   BLADDER SURGERY  2010   Broken Pelvis  2015   C-EYE SURGERY PROCEDURE Bilateral    zaldivar   CATARACT EXTRACTION Bilateral    Done in [place]    EYE SURGERY Bilateral    Cat Sx   HIP PINNING,CANNULATED Right 10/07/2013   Procedure: CANNULATED screws right hip;  Surgeon: Lonni CINDERELLA Poli, MD;  Location: [facility];  Service: Orthopedics;  Laterality: Right;   Family History Family History  Problem Relation Age of Onset   Prostate cancer Father    Heart attack Sister    Cancer Brother    Cancer Sister    Heart attack Sister    Cancer Sister    Stroke Daughter    Dementia Neg Hx    Alzheimer's disease Neg Hx     Social History Social History   Tobacco Use   Smoking status: Former    Current packs/day: 0.00    Types: Cigarettes    Quit date: 08/02/1970    Years since quitting: 53.1   Smokeless tobacco: Never   Tobacco comments:    smoked very little  Vaping Use   Vaping status: Never Used  Substance Use Topics   Alcohol  use: Never    Alcohol /week: 0.0 standard drinks of alcohol    Drug use: Never   Allergies Codeine  Review of Systems A thorough review of systems was obtained and all systems are negative except as noted in the HPI and PMH.   Physical Exam Vital Signs  I have reviewed the triage vital signs BP (!) 158/55 (BP  Location: Right Arm)   Pulse 68   Temp 97.6 F (36.4 C) (Oral)   Resp 20   Ht 5' 11 (1.803 m)   Wt 71.6 kg   SpO2 100%   BMI 22.02 kg/m  Physical Exam Vitals and nursing note reviewed.  Constitutional:      General: She is not in acute distress.    Appearance: Normal appearance.  HENT:     Head: Normocephalic.      Right Ear: External ear normal.     Left Ear: External ear normal.     Nose: Nose normal.     Mouth/Throat:     Mouth: Mucous membranes are moist.  Eyes:     General: No scleral icterus.       Right eye: No discharge.        Left eye: No discharge.     Extraocular Movements: Extraocular movements intact.     Pupils: Pupils are equal, round, and reactive to light.  Cardiovascular:     Rate and Rhythm: Normal rate. Rhythm irregular.     Pulses: Normal pulses.     Heart sounds: Normal heart sounds.  Pulmonary:     Effort: Pulmonary effort is normal. No respiratory distress.     Breath sounds: Normal breath sounds. No stridor.  Abdominal:     General: Abdomen is flat. There is no distension.     Palpations: Abdomen is soft.     Tenderness: There is no abdominal tenderness.  Musculoskeletal:     Cervical back: No rigidity.     Right lower leg: No edema.     Left lower leg: No edema.  Skin:    General: Skin is warm and dry.     Capillary Refill: Capillary refill takes less than 2 seconds.  Neurological:     Mental Status:  She is alert and oriented to person, place, and time.     GCS: GCS eye subscore is 4. GCS verbal subscore is 5. GCS motor subscore is 6.     Cranial Nerves: Cranial nerves 2-12 are intact.     Sensory: Sensation is intact.     Motor: Motor function is intact.     Coordination: Coordination is intact.     Comments: Gait testing deferred secondary to patient safety. Strength 5/5 to BLUE/BLLE, equal and symmetric    Psychiatric:        Mood and Affect: Mood normal.        Behavior: Behavior normal. Behavior is cooperative.     ED  Results and Treatments Labs (all labs ordered are listed, but only abnormal results are displayed) Labs Reviewed  BASIC METABOLIC PANEL WITH GFR - Abnormal; Notable for the following components:      Result Value   Glucose, Bld 102 (*)    Creatinine, Ser 1.29 (*)    GFR, Estimated 38 (*)    All other components within normal limits  BASIC METABOLIC PANEL WITH GFR - Abnormal; Notable for the following components:   Glucose, Bld 107 (*)    Creatinine, Ser 1.32 (*)    GFR, Estimated 37 (*)    All other components within normal limits  URINALYSIS, ROUTINE W REFLEX MICROSCOPIC - Abnormal; Notable for the following components:   APPearance CLOUDY (*)    Hgb urine dipstick MODERATE (*)    Protein, ur 30 (*)    Leukocytes,Ua LARGE (*)    Bacteria, UA MANY (*)    All other components within normal limits  BASIC METABOLIC PANEL WITH GFR - Abnormal; Notable for the following components:   Glucose, Bld 101 (*)    BUN 26 (*)    Creatinine, Ser 1.83 (*)    GFR, Estimated 25 (*)    All other components within normal limits  CBC - Abnormal; Notable for the following components:   Hemoglobin 11.7 (*)    HCT 35.6 (*)    All other components within normal limits  BASIC METABOLIC PANEL WITH GFR - Abnormal; Notable for the following components:   Glucose, Bld 110 (*)    Creatinine, Ser 1.53 (*)    GFR, Estimated 31 (*)    All other components within normal limits  CBC - Abnormal; Notable for the following components:   RBC 3.72 (*)    Hemoglobin 10.9 (*)    HCT 32.8 (*)    All other components within normal limits  BASIC METABOLIC PANEL WITH GFR - Abnormal; Notable for the following components:   Sodium 146 (*)    Creatinine, Ser 1.39 (*)    GFR, Estimated 35 (*)    Anion gap 17 (*)    All other components within normal limits  CBC  CK  TSH  CBC  SODIUM, URINE, RANDOM  CREATININE, URINE, RANDOM  TROPONIN I (HIGH SENSITIVITY)  TROPONIN I (HIGH SENSITIVITY)  Radiology No results found.  Pertinent labs & imaging results that were available during my care of the patient were reviewed by me and considered in my medical decision making (see MDM for details).  Medications Ordered in ED Medications  perflutren  lipid microspheres (DEFINITY ) IV suspension (3 mLs Intravenous Given 08/23/23 0823)  Tdap (BOOSTRIX ) injection 0.5 mL (0.5 mLs Intramuscular Given 08/22/23 1232)  lidocaine -EPINEPHrine  (XYLOCAINE  W/EPI) 2 %-1:200000 (PF) injection 10 mL (10 mLs Other Given 08/22/23 1233)  lidocaine -EPINEPHrine -tetracaine  (LET) topical gel (3 mLs Topical Given 08/22/23 1405)  lactated ringers  bolus 1,000 mL (1,000 mLs Intravenous New Bag/Given 08/22/23 1656)                                                                                                                                     Procedures Procedures  (including critical care time)  Medical Decision Making / ED Course    Medical Decision Making:    Jerolyn Flenniken is a 89 y.o. female with past medical history as below, significant for CKD, HLD, HTN SVT, DNR who presents to the ED with complaint of syncope, head injury. The complaint involves an extensive differential diagnosis and also carries with it a high risk of complications and morbidity.  Serious etiology was considered. Ddx includes but is not limited to: Differential diagnoses for head trauma includes subdural hematoma, epidural hematoma, acute concussion, traumatic subarachnoid hemorrhage, cerebral contusions, ACS, arrhythmia, dehydration, seizure etc.   Complete initial physical exam performed, notably the patient was in no acute distress, resting comfortably.    Reviewed and confirmed nursing documentation for past medical history, family history, social history.  Vital signs reviewed.    Syncope New afib> - Patient denies prodrome,  episode did occur with standing/getting dressed - Patient reports multiple episodes of syncope over the past few months, she has had episodes of syncope while sitting in chair, she has also had syncopal episodes with exertion/standing - EKG and telemetry today is concerning for atrial fibrillation, she denies any history of atrial fibrillation in the past, prior EKG reviewed do not show atrial fibrillation - CHA2DS2-VASc is a 4, given patient's age, DNR, will hold off on anticoagulation at this time -    Head injury> - Patient with ground-level fall with head injury, syncope - She is not anticoagulated, update tetanus, get CT head and cervical spine - CT imaging is reassuring, wound repaired by PA at bedside - Possible concussion, she is neuro intact      Plan to admit for recurrent syncope, new onset A-fib               Additional history obtained: -Additional history obtained from family -External records from outside source obtained and reviewed including: Chart review including previous notes, labs, imaging, consultation notes including  Prior EKG Home meds   Lab Tests: -I ordered, reviewed, and interpreted labs.   The pertinent results include:   Labs Reviewed  BASIC METABOLIC PANEL WITH GFR - Abnormal; Notable for the following components:      Result Value   Glucose, Bld 102 (*)    Creatinine, Ser 1.29 (*)    GFR, Estimated 38 (*)    All other components within normal limits  BASIC METABOLIC PANEL WITH GFR - Abnormal; Notable for the following components:   Glucose, Bld 107 (*)    Creatinine, Ser 1.32 (*)    GFR, Estimated 37 (*)    All other components within normal limits  URINALYSIS, ROUTINE W REFLEX MICROSCOPIC - Abnormal; Notable for the following components:   APPearance CLOUDY (*)    Hgb urine dipstick MODERATE (*)    Protein, ur 30 (*)    Leukocytes,Ua LARGE (*)    Bacteria, UA MANY (*)    All other components within normal limits  BASIC  METABOLIC PANEL WITH GFR - Abnormal; Notable for the following components:   Glucose, Bld 101 (*)    BUN 26 (*)    Creatinine, Ser 1.83 (*)    GFR, Estimated 25 (*)    All other components within normal limits  CBC - Abnormal; Notable for the following components:   Hemoglobin 11.7 (*)    HCT 35.6 (*)    All other components within normal limits  BASIC METABOLIC PANEL WITH GFR - Abnormal; Notable for the following components:   Glucose, Bld 110 (*)    Creatinine, Ser 1.53 (*)    GFR, Estimated 31 (*)    All other components within normal limits  CBC - Abnormal; Notable for the following components:   RBC 3.72 (*)    Hemoglobin 10.9 (*)    HCT 32.8 (*)    All other components within normal limits  BASIC METABOLIC PANEL WITH GFR - Abnormal; Notable for the following components:   Sodium 146 (*)    Creatinine, Ser 1.39 (*)    GFR, Estimated 35 (*)    Anion gap 17 (*)    All other components within normal limits  CBC  CK  TSH  CBC  SODIUM, URINE, RANDOM  CREATININE, URINE, RANDOM  TROPONIN I (HIGH SENSITIVITY)  TROPONIN I (HIGH SENSITIVITY)    Notable for stable labs  EKG   EKG Interpretation Date/Time:  Tuesday August 22 2023 10:51:34 EDT Ventricular Rate:  67 PR Interval:    QRS Duration:  91 QT Interval:  437 QTC Calculation: 462 R Axis:   52  Text Interpretation: Atrial fibrillation Confirmed by Elnor Savant ([id number]) on 08/22/2023 12:22:55 PM         Imaging Studies ordered: I ordered imaging studies including cth/ctcervical/ xr pelvis/chest I independently visualized the following imaging with scope of interpretation limited to determining acute life threatening conditions related to emergency care; findings noted above I agree with the radiologist interpretation If any imaging was obtained with contrast I closely monitored patient for any possible adverse reaction a/w contrast administration in the emergency department   Medicines ordered and prescription  drug management: Meds ordered this encounter  Medications   Tdap (BOOSTRIX ) injection 0.5 mL   DISCONTD: sodium chloride  0.9 % bolus 500 mL   lidocaine -EPINEPHrine  (XYLOCAINE  W/EPI) 2 %-1:200000 (PF) injection 10 mL   lidocaine -EPINEPHrine -tetracaine  (LET) topical gel   DISCONTD: acetaminophen  (TYLENOL ) tablet 650 mg   DISCONTD: acetaminophen  (TYLENOL ) suppository 650 mg   DISCONTD: polyethylene glycol (MIRALAX  / GLYCOLAX ) packet 17 g   DISCONTD: enoxaparin  (LOVENOX ) injection 30 mg   lactated ringers  bolus 1,000 mL   perflutren   lipid microspheres (DEFINITY ) IV suspension   DISCONTD: amiodarone  (PACERONE ) tablet 100 mg   DISCONTD: amiodarone  (PACERONE ) tablet 100 mg   DISCONTD: citalopram  (CELEXA ) tablet 20 mg   DISCONTD: ferrous sulfate  tablet 325 mg   DISCONTD: levothyroxine  (SYNTHROID ) tablet 75 mcg   DISCONTD: mirtazapine  (REMERON ) tablet 7.5 mg   DISCONTD: pantoprazole  (PROTONIX ) EC tablet 40 mg   DISCONTD: Polyethyl Glycol-Propyl Glycol 0.4-0.3 % SOLN   DISCONTD: artificial tears ophthalmic solution 1 drop   DISCONTD: amLODipine  (NORVASC ) tablet 5 mg   DISCONTD: amiodarone  (PACERONE ) tablet 50 mg   pantoprazole  (PROTONIX ) 40 MG tablet    Sig: Take 1 tablet (40 mg total) by mouth daily.    Dispense:  30 tablet    Refill:  0    -I have reviewed the patients home medicines and have made adjustments as needed   Consultations Obtained: I requested consultation with the hospitalist,  and discussed lab and imaging findings as well as pertinent plan  Cardiac Monitoring: The patient was maintained on a cardiac monitor.  I personally viewed and interpreted the cardiac monitored which showed an underlying rhythm of: afib Continuous pulse oximetry interpreted by myself, 99% on ra.    Social Determinants of Health:  Diagnosis or treatment significantly limited by social determinants of health: former smoker   Reevaluation: After the interventions noted above, I reevaluated the  patient and found that they have improved  Co morbidities that complicate the patient evaluation  Past Medical History:  Diagnosis Date   Acid reflux    Anemia    Arthropathy    Bradycardia    beta blocker reduced in 2021 due to this   CKD (chronic kidney disease), stage III (HCC)    Cognitive decline    referred to neurologist   Depression 10/07/2013   daughter didn't know anything about this   Dizziness    Frequent PVCs    GERD (gastroesophageal reflux disease) 05/04/2013   Hip fx (HCC)    r hip, bilat pubis rami   Hyperlipemia    Hypertension    Hypertensive retinopathy    OU   Hypothyroidism    Insomnia    Macular degeneration    Exu OS   Multilevel degenerative disc disease    Palpitations 05/04/2013   saw cardiologist in 2015, he deleted A. Fib from the record as per note not documentation to support   Premature atrial contractions    PSVT (paroxysmal supraventricular tachycardia) (HCC)    a. short runs by monitor in 2021.   Short-term memory loss    Vitamin D  deficiency       Dispostion: Disposition decision including need for hospitalization was considered, and patient admitted to the hospital.    Final Clinical Impression(s) / ED Diagnoses Final diagnoses:  Syncope, unspecified syncope type  Atrial fibrillation, unspecified type [facility])  Injury of head, initial encounter        Elnor Jayson LABOR, DO 08/28/23 0840

## 2023-08-22 NOTE — H&P (Cosign Needed Addendum)
 Date: 08/22/2023               Patient Name:  Tracey Rosario MRN: [id number]  DOB: [birth year] Age / Sex: 89 y.o., female   PCP: Pcp, No         Medical Service: Internal Medicine Teaching Service         Attending Physician: Dr. MICAEL Riis Winfrey      First Contact: An Nguyen, MS IV     Second Contact: Dr. Lonni Africa, DO         Pager Information: First Contact Pager: [phone]   Second Contact Pager: [phone]   SUBJECTIVE   Chief Complaint: Syncope and collapse   History of Present Illness: Tracey Rosario is a 89 y.o. female with PMH of CKD, HLD, syncope, dementia, and HTN who presented to the ED after a fall that led to head laceration. Patient was found in the bathroom of the memory care unit where she was residing. This event was unwitnessed and patient was unclear about events leading to, during and after the fall. She does not recall any prodromal symptoms and was not exerting herself. She also does not recall loss of consciousness or dehydration prior to the event.   Patient reports adherence to her medications and there is no family history of cardiac-related disorders. She has had history of recurrent fall and syncopal episodes. Her daughter stated that the last 2 ED visits for syncope were 2 months and 6 months ago and they were due to  dehydration. There is also a history of paroxysmal supraventricular tachycardia and she is not currently following with cardiology.   Patient has dementia at baseline and it was difficult to obtain a detailed history. Will contact her facility for correct list of medications.   ED Course: Labs significant for Cr 1.29, CBC unremarkable, Troponin and CK normal range Imaging  - EKG: A-fib  - Chest XR: No acute cardiopulmonary abnormality  - Pelvis XR: No fracture, pelvic bone diastasis, or dislocation  - Head CT: No acute intracranial abnormality; Soft tissue swelling and laceration involving the right frontal  scalp and forehead  - Cervical Spine CT: No acute abnormality    Past Medical History Hypertension Anemia Chronic Kidney Disease III Depression  Hyperlipidemia Vitamin D  Deficiency   Past Surgical History Past Surgical History:  Procedure Laterality Date   ABDOMINAL HYSTERECTOMY  1971   BLADDER SURGERY  2010   Broken Pelvis  2015   C-EYE SURGERY PROCEDURE Bilateral    zaldivar   CATARACT EXTRACTION Bilateral    Done in [place]    EYE SURGERY Bilateral    Cat Sx   HIP PINNING,CANNULATED Right 10/07/2013   Procedure: CANNULATED screws right hip;  Surgeon: Lonni CINDERELLA Poli, MD;  Location: [facility] OR;  Service: Orthopedics;  Laterality: Right;    Meds:  Patient's daughter stated meds have not changed compared to what has been documented. Will attempt to contact patient's facility tomorrow to confirm.   - Albuterol  inhaler - Amlodipine  5 mg - Cholecalciferol   - Citalopram  20 mg - Donepezil  23 mg  - Ferrous sulfate  325 mg  - Furosemide  20 mg - Gabapentin  300 mg - Levothyroxine  75 mcg  - Lisinopril  5 mg - Loperamide 2 mg - Memantine  5 mg - Mirtazapine  7.5 mg - Nystatin cream - Omeprazole  20 mg - Ondansetron  4 mg  - Oxybutynin  10 mg   Social:  Lives: memory units  Support: sister  Level of Function: dependent in IADLs PCP:  Pcp, No  Substances: -Tobacco: None -Alcohol : None -Recreational Drug: None   Family History:  Family History  Problem Relation Age of Onset   Prostate cancer Father    Heart attack Sister    Cancer Brother    Cancer Sister    Heart attack Sister    Cancer Sister    Stroke Daughter    Dementia Neg Hx    Alzheimer's disease Neg Hx      Allergies: Allergies as of 08/22/2023 - Review Complete 08/22/2023  Allergen Reaction Noted   Codeine Other (See Comments) 05/04/2013    Review of Systems: A complete ROS was negative except as per HPI.   OBJECTIVE:   Physical Exam: Blood pressure (!) 141/60, pulse 61, temperature 97.7 F  (36.5 C), temperature source Oral, resp. rate 14, height 5' 11 (1.803 m), weight 71 kg, SpO2 97%.  Constitutional: well-appearing female sitting in bed, in no acute distress HENT: normocephalic atraumatic, mucous membranes moist Eyes: conjunctiva non-erythematous Neck: supple Cardiovascular: Irregular rate and normal rhythm, no m/r/g Pulmonary/Chest: normal work of breathing on room air, lungs clear to auscultation bilaterally Abdominal: soft, non-tender, non-distended MSK: normal bulk and tone Neurological: alert & oriented x 2 - not oriented to time (baseline) Skin: Lacerations with stitches on right upper scalp, mild superficial bruise on right knee Psych: Normal  Labs: CBC    Component Value Date/Time   WBC 8.2 08/22/2023 1124   RBC 4.92 08/22/2023 1124   HGB 14.3 08/22/2023 1124   HCT 43.5 08/22/2023 1124   PLT 251 08/22/2023 1124   MCV 88.4 08/22/2023 1124   MCH 29.1 08/22/2023 1124   MCHC 32.9 08/22/2023 1124   RDW 13.4 08/22/2023 1124   LYMPHSABS 0.7 06/16/2023 1055   MONOABS 0.9 06/16/2023 1055   EOSABS 0.1 06/16/2023 1055   BASOSABS 0.0 06/16/2023 1055     CMP     Component Value Date/Time   NA 140 08/22/2023 1124   K 4.8 08/22/2023 1124   CL 103 08/22/2023 1124   CO2 24 08/22/2023 1124   GLUCOSE 102 (H) 08/22/2023 1124   BUN 16 08/22/2023 1124   CREATININE 1.29 (H) 08/22/2023 1124   CALCIUM 10.0 08/22/2023 1124   PROT 7.1 06/16/2023 1055   ALBUMIN 3.9 06/16/2023 1055   AST 20 06/16/2023 1055   ALT 12 06/16/2023 1055   ALKPHOS 50 06/16/2023 1055   BILITOT 0.7 06/16/2023 1055   GFRNONAA 38 (L) 08/22/2023 1124   GFRAA 52 (L) 04/24/2019 1254    Imaging: CT Cervical Spine Wo Contrast Result Date: 08/22/2023 EXAM: CT CERVICAL SPINE WITHOUT CONTRAST 08/22/2023 11:35:26 AM TECHNIQUE: CT of the cervical spine was performed without the administration of intravenous contrast. Multiplanar reformatted images are provided for review. Automated exposure control,  iterative reconstruction, and/or weight based adjustment of the mA/kV was utilized to reduce the radiation dose to as low as reasonably achievable. COMPARISON: CT cervical spine dated 11/08/2022. CLINICAL HISTORY: Neck trauma (Age >= 65y). Triage notes; Pt to ED via [facility] from [facility] unit. Pt had unwittnessed fall, pt found laying on bathroom fall. Pt has lac to right side of head and a skin tear to left elbow, abrasion to right knee. Pt told nursing staff she felt dizzy, told EMS ; she was just going to get dressed but doesn't remember fall. Pt has bandage in place on head and c-collar in place on arrival. Pt did c/o hip pain, was laying on left hip when EMS arrived. Pt does  not take blood thinners. A\T\Ox2, baseline for pt.; ; 147/68; HR 64; 95%; Cbg 127 FINDINGS: CERVICAL SPINE: BONES AND ALIGNMENT: Cervical lordosis is maintained. No spondylolisthesis, no facet subluxation or dislocation. No compression fracture or displaced fracture in the cervical spine. Lumbarization of the first sacral vertebra. DEGENERATIVE CHANGES: Disc osteophyte complexes at multiple levels. There is severe disc space narrowing at C6-7. There is mild spinal canal stenosis at C6-7. Endplate arthrosis and osteophyte hypertrophy at multiple levels. There is significant foraminal stenosis most pronounced at C3-4. SOFT TISSUES: No prevertebral soft tissue swelling. LUNGS: Biapical pleural parenchymal scarring and bilateral pleural calcifications. IMPRESSION: 1. No acute abnormality of the cervical spine related to the reported neck trauma. Electronically signed by: Donnice Mania MD 08/22/2023 11:55 AM EDT RP Workstation: [id number]   CT Head Wo Contrast Result Date: 08/22/2023 EXAM: CT HEAD WITHOUT CONTRAST 08/22/2023 11:35:26 AM TECHNIQUE: CT of the head was performed without the administration of intravenous contrast. Automated exposure control, iterative reconstruction, and/or weight based adjustment of the mA/kV was  utilized to reduce the radiation dose to as low as reasonably achievable. COMPARISON: CT head 115 24 CLINICAL HISTORY: Head trauma, minor (Age >= 65y). Triage notes; Pt to ED via [facility] from [facility] unit. Pt had unwittnessed fall, pt found laying on bathroom fall. Pt has lac to right side of head and a skin tear to left elbow, abrasion to right knee. Pt told nursing staff she felt dizzy, told EMS ; she was just going to get dressed but doesn't remember fall. Pt has bandage in place on head and c-collar in place on arrival. Pt did c/o hip pain, was laying on left hip when EMS arrived. Pt does not take blood thinners. A\T\Ox2, baseline for pt.; ; 147/68; HR 64; 95%; Cbg 127 FINDINGS: BRAIN AND VENTRICLES: No acute hemorrhage. Gray-white differentiation is preserved. No hydrocephalus. No extra-axial collection. No mass effect or midline shift. Nonspecific hypoattenuation in the periventricular and subcortical white matter, most likely representing chronic small vessel disease. Generalized brain volume loss. ORBITS: Bilateral lens replacement. SINUSES: No acute abnormality. SOFT TISSUES AND SKULL: Focal soft tissue swelling and laceration involving the right frontal scalp and forehead. No skull fracture. VASCULATURE: Atherosclerosis of the carotid siphons and intracranial vertebral arteries. IMPRESSION: 1. No acute intracranial abnormality related to the reported head trauma. 2. Focal soft tissue swelling and laceration involving the right frontal scalp and forehead. Electronically signed by: Donnice Mania MD 08/22/2023 11:49 AM EDT RP Workstation: [id number]   DG Pelvis 1-2 Views Result Date: 08/22/2023 CLINICAL DATA:  syncope EXAM: PELVIS - 1-2 VIEW COMPARISON:  January 17, 2022 FINDINGS: Similar alignment of the 3 cortical lag screws transfixing the right femoral neck. Diffuse osteopenia.No evidence of pelvic fracture or diastasis.No acute hip fracture or dislocation.Soft tissues are unremarkable.  IMPRESSION: No acute fracture, pelvic bone diastasis, or dislocation. Electronically Signed   By: Rogelia Myers M.D.   On: 08/22/2023 11:48   DG Chest Port 1 View Result Date: 08/22/2023 CLINICAL DATA:  syncope EXAM: DG CHEST 1V PORT COMPARISON:  February 04, 2023 FINDINGS: Biapical pleural thickening with densely calcified pleural plaques. No focal airspace consolidation, pleural effusion, or pneumothorax. No cardiomegaly. Aortic atherosclerosis. No acute fracture or destructive lesions. Multilevel thoracic osteophytosis. IMPRESSION: No acute cardiopulmonary abnormality. Electronically Signed   By: Rogelia Myers M.D.   On: 08/22/2023 11:38      ASSESSMENT & PLAN:   Assessment & Plan by Problem: Principal Problem:   Syncope and collapse Hypertension  Anemia Chronic Kidney Disease III Depression  Hyperlipidemia Vitamin D  Deficiency   Tracey Rosario is a 89 y.o. person living with a history of PMH of CKD, HLD, syncope, dementia, and HTN who presented with a fall that to head injury admitted for syncope on hospital day 0  Recurrent Syncope  Patient most likely had an unwitnessed syncopal episode. Differentials include reflex syncope, orthostatic syncope in setting dehydration and cardiogenic syncope. Suspicion for reflex syncope is not high as there was no reported prodrome, exertion before the fall, or carotid sinus disorder. Most recent echo in 01/06/2022 showed EF of 65 to 70%. Patient is stable on room air with no chest pain or shortness of breath and there was no signs volume overload on exam. Furosemide  could also be a contributing factor. Will confirm list of medication with her facility. There is also possibility of dehydration due to decreased fluid intake, 1L LR bolus was given.  - Encourage adequate hydration  - Orthostatics pending   - TSH pending    - Echocardiogram pending  New-onset Atrial Fibrillation  EKG showed atrial fibrillation. Pulse is irregular on exam.  There is no history of a-fib and she is not anticoagulation. CHA2DS2-VASc 4. HAS-BLED score 2. It is possible this is a contributing factor to her syncopal episode. There was no signs of palpitations, fatigue, and dyspnea. Will defer anticoagulation due to her age and history of recurrent falls.   - Tomorrow will contact cardiology for a heart monitor upon discharge  - telemetry  AKI  Cr 1.29. Baseline is around 0.97. There is documented history of CKD stage III. There was no signs of dysuria or hematuria. This is likely prerenal due to dehydration as her 2 previous ED visits of syncope showed similar pattern of elevated Cr. Will give IVF and reassess.   - 1L LR bolus given   - Monitor with BMP     Chronic Conditions: Will hold non-essential home meds until confirmation of medicines.   Hypertension: normotensive Hypothyroidism: anticipate resuming synthroid  shortly Anxiety/Depression: stable  Anemia: Hgb stable Hyperlipidemia GERD  Best practice: Diet: Normal VTE: Enoxaparin  IVF: LR,Bolus Code: DNR  Disposition planning: Prior to Admission Living Arrangement: Memory unit Anticipated Discharge Location: Memory unit  Barriers to Discharge: Syncope   Dispo: Admit patient to Observation with expected length of stay less than 2 midnights.  Signed: Nguyen, An T, Medical Student Internal Medicine Resident  08/22/2023, 4:51 PM  Please contact IM Residency On-Call Pager at: [phone] or [phone].   Attestation for Student Documentation:  I personally was present and re-performed the history, physical exam and medical decision-making activities of this service and have verified that the service and findings are accurately documented in the student's note.  Harrie Bruckner, DO 08/22/2023, 6:54 PM

## 2023-08-22 NOTE — ED Notes (Signed)
 CCMD Called. Pt Placed on monitor.

## 2023-08-23 ENCOUNTER — Telehealth: Payer: Self-pay | Admitting: Cardiology

## 2023-08-23 ENCOUNTER — Observation Stay (HOSPITAL_BASED_OUTPATIENT_CLINIC_OR_DEPARTMENT_OTHER)

## 2023-08-23 ENCOUNTER — Encounter (INDEPENDENT_AMBULATORY_CARE_PROVIDER_SITE_OTHER): Admitting: Ophthalmology

## 2023-08-23 DIAGNOSIS — I129 Hypertensive chronic kidney disease with stage 1 through stage 4 chronic kidney disease, or unspecified chronic kidney disease: Secondary | ICD-10-CM | POA: Diagnosis present

## 2023-08-23 DIAGNOSIS — R55 Syncope and collapse: Secondary | ICD-10-CM

## 2023-08-23 DIAGNOSIS — H3322 Serous retinal detachment, left eye: Secondary | ICD-10-CM

## 2023-08-23 DIAGNOSIS — I4892 Unspecified atrial flutter: Secondary | ICD-10-CM

## 2023-08-23 DIAGNOSIS — I483 Typical atrial flutter: Secondary | ICD-10-CM

## 2023-08-23 DIAGNOSIS — H35033 Hypertensive retinopathy, bilateral: Secondary | ICD-10-CM

## 2023-08-23 DIAGNOSIS — F32A Depression, unspecified: Secondary | ICD-10-CM | POA: Diagnosis present

## 2023-08-23 DIAGNOSIS — Z79899 Other long term (current) drug therapy: Secondary | ICD-10-CM | POA: Diagnosis not present

## 2023-08-23 DIAGNOSIS — I495 Sick sinus syndrome: Secondary | ICD-10-CM | POA: Diagnosis present

## 2023-08-23 DIAGNOSIS — H3563 Retinal hemorrhage, bilateral: Secondary | ICD-10-CM

## 2023-08-23 DIAGNOSIS — W1830XA Fall on same level, unspecified, initial encounter: Secondary | ICD-10-CM | POA: Diagnosis present

## 2023-08-23 DIAGNOSIS — I951 Orthostatic hypotension: Secondary | ICD-10-CM | POA: Diagnosis present

## 2023-08-23 DIAGNOSIS — I10 Essential (primary) hypertension: Secondary | ICD-10-CM

## 2023-08-23 DIAGNOSIS — D631 Anemia in chronic kidney disease: Secondary | ICD-10-CM | POA: Diagnosis present

## 2023-08-23 DIAGNOSIS — I4891 Unspecified atrial fibrillation: Secondary | ICD-10-CM | POA: Diagnosis present

## 2023-08-23 DIAGNOSIS — Z79890 Hormone replacement therapy (postmenopausal): Secondary | ICD-10-CM | POA: Diagnosis not present

## 2023-08-23 DIAGNOSIS — H35373 Puckering of macula, bilateral: Secondary | ICD-10-CM

## 2023-08-23 DIAGNOSIS — Z961 Presence of intraocular lens: Secondary | ICD-10-CM

## 2023-08-23 DIAGNOSIS — F0394 Unspecified dementia, unspecified severity, with anxiety: Secondary | ICD-10-CM | POA: Diagnosis present

## 2023-08-23 DIAGNOSIS — N1832 Chronic kidney disease, stage 3b: Secondary | ICD-10-CM | POA: Diagnosis present

## 2023-08-23 DIAGNOSIS — K219 Gastro-esophageal reflux disease without esophagitis: Secondary | ICD-10-CM | POA: Diagnosis present

## 2023-08-23 DIAGNOSIS — S0990XA Unspecified injury of head, initial encounter: Secondary | ICD-10-CM

## 2023-08-23 DIAGNOSIS — W19XXXA Unspecified fall, initial encounter: Secondary | ICD-10-CM | POA: Diagnosis not present

## 2023-08-23 DIAGNOSIS — I4589 Other specified conduction disorders: Secondary | ICD-10-CM | POA: Diagnosis present

## 2023-08-23 DIAGNOSIS — E785 Hyperlipidemia, unspecified: Secondary | ICD-10-CM | POA: Diagnosis present

## 2023-08-23 DIAGNOSIS — H353231 Exudative age-related macular degeneration, bilateral, with active choroidal neovascularization: Secondary | ICD-10-CM

## 2023-08-23 DIAGNOSIS — Z8249 Family history of ischemic heart disease and other diseases of the circulatory system: Secondary | ICD-10-CM | POA: Diagnosis not present

## 2023-08-23 DIAGNOSIS — N179 Acute kidney failure, unspecified: Secondary | ICD-10-CM | POA: Diagnosis present

## 2023-08-23 DIAGNOSIS — E039 Hypothyroidism, unspecified: Secondary | ICD-10-CM | POA: Diagnosis present

## 2023-08-23 DIAGNOSIS — H33101 Unspecified retinoschisis, right eye: Secondary | ICD-10-CM

## 2023-08-23 DIAGNOSIS — Z87891 Personal history of nicotine dependence: Secondary | ICD-10-CM | POA: Diagnosis not present

## 2023-08-23 DIAGNOSIS — Z66 Do not resuscitate: Secondary | ICD-10-CM | POA: Diagnosis present

## 2023-08-23 DIAGNOSIS — S8001XA Contusion of right knee, initial encounter: Secondary | ICD-10-CM | POA: Diagnosis present

## 2023-08-23 DIAGNOSIS — S0101XA Laceration without foreign body of scalp, initial encounter: Secondary | ICD-10-CM | POA: Diagnosis present

## 2023-08-23 DIAGNOSIS — E860 Dehydration: Secondary | ICD-10-CM | POA: Diagnosis present

## 2023-08-23 DIAGNOSIS — F0393 Unspecified dementia, unspecified severity, with mood disturbance: Secondary | ICD-10-CM | POA: Diagnosis present

## 2023-08-23 DIAGNOSIS — Z23 Encounter for immunization: Secondary | ICD-10-CM | POA: Diagnosis present

## 2023-08-23 LAB — BASIC METABOLIC PANEL WITH GFR
Anion gap: 10 (ref 5–15)
BUN: 17 mg/dL (ref 8–23)
CO2: 24 mmol/L (ref 22–32)
Calcium: 9.2 mg/dL (ref 8.9–10.3)
Chloride: 105 mmol/L (ref 98–111)
Creatinine, Ser: 1.32 mg/dL — ABNORMAL HIGH (ref 0.44–1.00)
GFR, Estimated: 37 mL/min — ABNORMAL LOW (ref >60)
Glucose, Bld: 107 mg/dL — ABNORMAL HIGH (ref 70–99)
Potassium: 4.3 mmol/L (ref 3.5–5.1)
Sodium: 139 mmol/L (ref 135–145)

## 2023-08-23 LAB — CBC
HCT: 38.6 % (ref 36.0–46.0)
Hemoglobin: 13 g/dL (ref 12.0–15.0)
MCH: 29.2 pg (ref 26.0–34.0)
MCHC: 33.7 g/dL (ref 30.0–36.0)
MCV: 86.7 fL (ref 80.0–100.0)
Platelets: 257 K/uL (ref 150–400)
RBC: 4.45 MIL/uL (ref 3.87–5.11)
RDW: 13.4 % (ref 11.5–15.5)
WBC: 7.5 K/uL (ref 4.0–10.5)
nRBC: 0 % (ref 0.0–0.2)

## 2023-08-23 LAB — ECHOCARDIOGRAM COMPLETE
Height: 71 in
S' Lateral: 1.85 cm
Weight: 2511.48 [oz_av]

## 2023-08-23 MED ORDER — AMIODARONE HCL 100 MG PO TABS
100.0000 mg | ORAL_TABLET | Freq: Every day | ORAL | Status: DC
  Filled 2023-08-23: qty 1

## 2023-08-23 MED ORDER — AMIODARONE HCL 100 MG PO TABS
100.0000 mg | ORAL_TABLET | Freq: Two times a day (BID) | ORAL | Status: DC
  Administered 2023-08-23 (×2): 100 mg via ORAL
  Filled 2023-08-23 (×3): qty 1

## 2023-08-23 MED ORDER — PERFLUTREN LIPID MICROSPHERE
1.0000 mL | INTRAVENOUS | Status: AC | PRN
Start: 2023-08-23 — End: 2023-08-23
  Administered 2023-08-23: 3 mL via INTRAVENOUS

## 2023-08-23 MED ORDER — PANTOPRAZOLE SODIUM 40 MG PO TBEC
40.0000 mg | DELAYED_RELEASE_TABLET | Freq: Every day | ORAL | Status: DC
  Administered 2023-08-23 – 2023-08-25 (×3): 40 mg via ORAL
  Filled 2023-08-23 (×3): qty 1

## 2023-08-23 MED ORDER — POLYETHYL GLYCOL-PROPYL GLYCOL 0.4-0.3 % OP SOLN: Freq: Three times a day (TID) | OPHTHALMIC | Status: DC

## 2023-08-23 MED ORDER — FERROUS SULFATE 325 (65 FE) MG PO TABS
325.0000 mg | ORAL_TABLET | Freq: Every day | ORAL | Status: DC
  Administered 2023-08-23 – 2023-08-25 (×3): 325 mg via ORAL
  Filled 2023-08-23 (×3): qty 1

## 2023-08-23 MED ORDER — MIRTAZAPINE 15 MG PO TABS
7.5000 mg | ORAL_TABLET | Freq: Every day | ORAL | Status: DC
Start: 2023-08-23 — End: 2023-08-25
  Administered 2023-08-23 – 2023-08-24 (×2): 7.5 mg via ORAL
  Filled 2023-08-23 (×2): qty 1

## 2023-08-23 MED ORDER — LEVOTHYROXINE SODIUM 75 MCG PO TABS
75.0000 ug | ORAL_TABLET | Freq: Every day | ORAL | Status: DC
  Administered 2023-08-24 – 2023-08-25 (×2): 75 ug via ORAL
  Filled 2023-08-23 (×2): qty 1

## 2023-08-23 MED ORDER — CITALOPRAM HYDROBROMIDE 20 MG PO TABS
20.0000 mg | ORAL_TABLET | Freq: Every day | ORAL | Status: DC
  Administered 2023-08-23 – 2023-08-25 (×3): 20 mg via ORAL
  Filled 2023-08-23 (×3): qty 1

## 2023-08-23 MED ORDER — POLYVINYL ALCOHOL 1.4 % OP SOLN
1.0000 [drp] | Freq: Three times a day (TID) | OPHTHALMIC | Status: DC
  Administered 2023-08-23 – 2023-08-25 (×5): 1 [drp] via OPHTHALMIC
  Filled 2023-08-23: qty 15

## 2023-08-23 NOTE — Plan of Care (Signed)
  Problem: Education: Goal: Knowledge of disease or condition will improve Outcome: Progressing   Problem: Education: Goal: Knowledge of General Education information will improve Description: Including pain rating scale, medication(s)/side effects and non-pharmacologic comfort measures Outcome: Progressing   Problem: Clinical Measurements: Goal: Will remain free from infection Outcome: Progressing   Problem: Activity: Goal: Risk for activity intolerance will decrease Outcome: Progressing   Problem: Coping: Goal: Level of anxiety will decrease Outcome: Progressing   Problem: Safety: Goal: Ability to remain free from injury will improve Outcome: Progressing

## 2023-08-23 NOTE — Care Management Obs Status (Signed)
 MEDICARE OBSERVATION STATUS NOTIFICATION   Patient Details  Name: Tracey Rosario MRN: [id number] Date of Birth: 1926-06-13   Medicare Observation Status Notification Given:  Yes    Vonzell Arrie Sharps 08/23/2023, 9:45 AM

## 2023-08-23 NOTE — TOC Initial Note (Signed)
 Transition of Care [facility]) - Initial/Assessment Note    Patient Details  Name: Tracey Rosario MRN: [id number] Date of Birth: 19-Jul-1926  [facility] CM/SW Contact:    [name], LCSWA Phone Number: 08/23/2023, 11:22 AM  Clinical Narrative:    CSW spoke with patients daughter, [name], about patient returning to [facility]F once medically stable for DC. [name] is agreeable. CSW discussed PT recommendations for HHPT at ALF as well, [name] stated that is fine.   CSW left a voicemail for admissions at ALF. Awaiting call back.   CSW will continue to follow.         Expected Discharge Plan: Assisted Living (with HHPT) Barriers to Discharge: Continued Medical Work up   Patient Goals and CMS Choice Patient states their goals for this hospitalization and ongoing recovery are:: To return to ALF          Expected Discharge Plan and Services In-house Referral: Clinical Social Work     Living arrangements for the past 2 months: Assisted Living Facility                                      Prior Living Arrangements/Services Living arrangements for the past 2 months: Assisted Living Facility Lives with:: Facility Resident Patient language and need for interpreter reviewed:: Yes Do you feel safe going back to the place where you live?: Yes      Need for Family Participation in Patient Care: No (Comment) Care giver support system in place?: Yes (comment) Current home services: DME (rollator) Criminal Activity/Legal Involvement Pertinent to Current Situation/Hospitalization: No - Comment as needed  Activities of Daily Living   ADL Screening (condition at time of admission) Independently performs ADLs?: Yes (appropriate for developmental age) (uses a walker to ambulate) Is the patient deaf or have difficulty hearing?: No Does the patient have difficulty seeing, even when wearing glasses/contacts?: No Does the patient have difficulty  concentrating, remembering, or making decisions?: Yes  Permission Sought/Granted Permission sought to share information with : Facility Medical sales representative, Family Supports Permission granted to share information with : No (From an ALF and family contact in chart)  Share Information with NAME: Cooke,[name]  Permission granted to share info w AGENCY: ALF  Permission granted to share info w Relationship: Daughter  Permission granted to share info w Contact Information: [phone]  Emotional Assessment Appearance:: Appears stated age Attitude/Demeanor/Rapport: Unable to Assess Affect (typically observed): Unable to Assess Orientation: : Oriented to Self, Oriented to Place, Oriented to  Time Alcohol  / Substance Use: Not Applicable Psych Involvement: No (comment)  Admission diagnosis:  Syncope and collapse [R55] Injury of head, initial encounter [S09.90XA] Syncope, unspecified syncope type [R55] Atrial fibrillation, unspecified type [facility]) [I48.91] Patient Active Problem List   Diagnosis Date Noted   Syncope and collapse 08/22/2023   Hypokalemia 01/12/2022   Medication management 01/07/2022   Pneumonia of right lung due to infectious organism 01/07/2022   RSV infection 01/07/2022   Palliative care encounter 01/06/2022   Acute hypoxic respiratory failure (HCC) 01/06/2022   Goals of care, counseling/discussion 01/06/2022   DNR (do not resuscitate) 01/06/2022   Shortness of breath 01/06/2022   High risk medication use 01/06/2022   Agitation 01/06/2022   Sepsis with acute hypoxic respiratory failure without septic shock (HCC) 01/06/2022   Need for emotional support 01/06/2022   Counseling and coordination of care 01/06/2022   Severe sepsis (  HCC) 01/05/2022   Toxic encephalopathy 12/23/2021   Bradycardia 04/22/2019   Age-related memory disorder 11/12/2018   Other osteoporosis without current pathological fracture 01/28/2016   Memory impairment 10/15/2015   Esophageal reflux  06/11/2015   Small vessel disease (HCC) 04/30/2015   Essential hypertension 04/07/2015   Tremor 04/07/2015   Major depressive disorder with single episode, in full remission (HCC) 03/30/2015   Absolute anemia 03/30/2015   Asymptomatic PVCs 08/01/2013   Hyperlipidemia 06/04/2013   PCP:  Pcp, No Pharmacy:   [id number] [address] [place], [place] - [address] [place] [address] Phone: [phone] Fax: [phone]     Social Drivers of Health (SDOH) Social History: SDOH Screenings   Food Insecurity: No Food Insecurity (08/22/2023)  Housing: Low Risk  (08/22/2023)  Transportation Needs: No Transportation Needs (08/22/2023)  Utilities: Not At Risk (08/22/2023)  Social Connections: Moderately Isolated (08/22/2023)  Tobacco Use: Medium Risk (08/22/2023)   SDOH Interventions:     Readmission Risk Interventions    01/12/2022    1:09 PM  Readmission Risk Prevention Plan  Transportation Screening Complete  PCP or Specialist Appt within 3-5 Days Complete  HRI or Home Care Consult Complete  Social Work Consult for Recovery Care Planning/Counseling Complete  Palliative Care Screening Complete  Medication Review Oceanographer) Complete

## 2023-08-23 NOTE — Progress Notes (Signed)
 HD#0 SUBJECTIVE:  Patient Summary: Tracey Rosario is a 89 y.o. with a pertinent PMH of CKD, HLD, syncope, dementia, and HTN who presented to the ED after a fall that led to head laceration and admitted for syncope.   Overnight Events: None   Interim History: Patient denies feeling any dizziness this morning or last night. There was also no chest pain, shortness of breath or palpitations. She also doesn't think that she was dizzy during orthostatic vitals. When revisiting the events related to the fall, she still doesn't recall entire specifics, but her daughter at bedside thinks that she was washing her face and preparing to get dressed.   OBJECTIVE:  Vital Signs: Vitals:   08/22/23 1700 08/23/23 0005 08/23/23 0600 08/23/23 0730  BP: 133/69 (!) 144/55 (!) 156/50 (!) 136/46  Pulse: 81 (!) 56 68 94  Resp: (!) 23 19 20 18   Temp:  (!) 97.5 F (36.4 C) 97.9 F (36.6 C) 97.7 F (36.5 C)  TempSrc:  Oral Oral Oral  SpO2: 96%  91% 94%  Weight:  72.1 kg 71.2 kg   Height:       Supplemental O2: Room Air SpO2: 94 %  Filed Weights   08/22/23 1008 08/23/23 0005 08/23/23 0600  Weight: 71 kg 72.1 kg 71.2 kg     Intake/Output Summary (Last 24 hours) at 08/23/2023 1112 Last data filed at 08/23/2023 9096 Gross per 24 hour  Intake 240 ml  Output 600 ml  Net -360 ml   Net IO Since Admission: -360 mL [08/23/23 1112]  Physical Exam: Physical Exam Constitutional:      General: She is not in acute distress.    Appearance: She is not ill-appearing.  Cardiovascular:     Rate and Rhythm: Normal rate. Rhythm irregular.     Pulses: Normal pulses.     Heart sounds: Normal heart sounds.  Pulmonary:     Effort: Pulmonary effort is normal.     Breath sounds: Normal breath sounds.  Skin:    General: Skin is warm and dry.     Comments: Laceration on upper right forehead   Neurological:     Mental Status: She is alert and oriented to person, place, and time.  Psychiatric:         Mood and Affect: Mood normal.        Behavior: Behavior normal.     Patient Lines/Drains/Airways Status     Active Line/Drains/Airways     Name Placement date Placement time Site Days   Peripheral IV 08/22/23 22 G Anterior;Right Forearm 08/22/23  1409  Forearm  1   Wound 08/22/23 1800 Face Right;Upper 08/22/23  1800  Face  1            Pertinent labs and imaging:      Latest Ref Rng & Units 08/23/2023    3:17 AM 08/22/2023   11:24 AM 06/16/2023   10:55 AM  CBC  WBC 4.0 - 10.5 K/uL 7.5  8.2  11.3   Hemoglobin 12.0 - 15.0 g/dL 86.9  85.6  88.0   Hematocrit 36.0 - 46.0 % 38.6  43.5  36.5   Platelets 150 - 400 K/uL 257  251  237        Latest Ref Rng & Units 08/23/2023    3:17 AM 08/22/2023   11:24 AM 06/16/2023   10:55 AM  CMP  Glucose 70 - 99 mg/dL 892  897  897   BUN 8 - 23 mg/dL 17  16  15   Creatinine 0.44 - 1.00 mg/dL 8.67  8.70  8.81   Sodium 135 - 145 mmol/L 139  140  136   Potassium 3.5 - 5.1 mmol/L 4.3  4.8  4.3   Chloride 98 - 111 mmol/L 105  103  101   CO2 22 - 32 mmol/L 24  24  24    Calcium 8.9 - 10.3 mg/dL 9.2  89.9  9.5   Total Protein 6.5 - 8.1 g/dL   7.1   Total Bilirubin 0.0 - 1.2 mg/dL   0.7   Alkaline Phos 38 - 126 U/L   50   AST 15 - 41 U/L   20   ALT 0 - 44 U/L   12     ECHOCARDIOGRAM COMPLETE Result Date: 08/23/2023    ECHOCARDIOGRAM REPORT   Patient Name:   Tracey Rosario Date of Exam: 08/23/2023 Medical Rec #:  [id number]                 Height:       71.0 in Accession #:    [id number]                Weight:       157.0 lb Date of Birth:  [birth year]                 BSA:          1.902 m Patient Age:    89 years                  BP:           136/46 mmHg Patient Gender: F                         HR:           79 bpm. Exam Location:  Inpatient Procedure: 2D Echo, Cardiac Doppler, Color Doppler and Intracardiac            Opacification Agent (Both Spectral and Color Flow Doppler were            utilized during procedure). Indications:     I48.91* Unspecified atrial fibrillation  History:        Patient has prior history of Echocardiogram examinations, most                 recent 01/06/2022. CKD; Risk Factors:Hypertension and                 Dyslipidemia.  Sonographer:    Damien Senior RDCS Referring Phys: LONNI [place] IMPRESSIONS  1. Left ventricular ejection fraction, by estimation, is 65 to 70%. The left ventricle has normal function. The left ventricle has no regional wall motion abnormalities. There is mild concentric left ventricular hypertrophy. Left ventricular diastolic parameters are indeterminate.  2. Right ventricular systolic function is normal. The right ventricular size is mildly enlarged. Tricuspid regurgitation signal is inadequate for assessing PA pressure.  3. Left atrial size was moderately dilated.  4. Right atrial size was moderately dilated.  5. The mitral valve is normal in structure. Trivial mitral valve regurgitation.  6. The aortic valve is tricuspid. There is mild calcification of the aortic valve. Aortic valve regurgitation is trivial. Aortic valve sclerosis/calcification is present, without any evidence of aortic stenosis. FINDINGS  Left Ventricle: Left ventricular ejection fraction, by estimation, is 65 to 70%. The left ventricle has normal function. The left ventricle has no regional  wall motion abnormalities. Definity  contrast agent was given IV to delineate the left ventricular  endocardial borders. The left ventricular internal cavity size was normal in size. There is mild concentric left ventricular hypertrophy. Left ventricular diastolic function could not be evaluated due to atrial fibrillation. Left ventricular diastolic parameters are indeterminate. Right Ventricle: The right ventricular size is mildly enlarged. No increase in right ventricular wall thickness. Right ventricular systolic function is normal. Tricuspid regurgitation signal is inadequate for assessing PA pressure. Left Atrium: Left atrial size  was moderately dilated. Right Atrium: Right atrial size was moderately dilated. Pericardium: There is no evidence of pericardial effusion. Presence of epicardial fat layer. Mitral Valve: The mitral valve is normal in structure. Trivial mitral valve regurgitation. Tricuspid Valve: The tricuspid valve is normal in structure. Tricuspid valve regurgitation is trivial. Aortic Valve: The aortic valve is tricuspid. There is mild calcification of the aortic valve. Aortic valve regurgitation is trivial. Aortic valve sclerosis/calcification is present, without any evidence of aortic stenosis. Pulmonic Valve: The pulmonic valve was grossly normal. Pulmonic valve regurgitation is trivial. Aorta: The aortic root and ascending aorta are structurally normal, with no evidence of dilitation. Venous: The inferior vena cava was not well visualized. IAS/Shunts: No atrial level shunt detected by color flow Doppler.  LEFT VENTRICLE PLAX 2D LVIDd:         2.50 cm LVIDs:         1.85 cm LV PW:         1.20 cm LV IVS:        1.20 cm LVOT diam:     2.10 cm LV SV:         68 LV SV Index:   36 LVOT Area:     3.46 cm  RIGHT VENTRICLE RV S prime:     11.70 cm/s TAPSE (M-mode): 1.6 cm LEFT ATRIUM             Index        RIGHT ATRIUM           Index LA diam:        3.30 cm 1.73 cm/m   RA Area:     23.70 cm LA Vol (A2C):   63.1 ml 33.17 ml/m  RA Volume:   77.70 ml  40.85 ml/m LA Vol (A4C):   85.6 ml 45.00 ml/m LA Biplane Vol: 76.1 ml 40.01 ml/m  AORTIC VALVE LVOT Vmax:   117.00 cm/s LVOT Vmean:  80.200 cm/s LVOT VTI:    0.197 m  AORTA Ao Root diam: 3.10 cm Ao Asc diam:  3.30 cm  SHUNTS Systemic VTI:  0.20 m Systemic Diam: 2.10 cm Toribio Fuel MD Electronically signed by Toribio Fuel MD Signature Date/Time: 08/23/2023/9:56:34 AM    Final    CT Cervical Spine Wo Contrast Result Date: 08/22/2023 EXAM: CT CERVICAL SPINE WITHOUT CONTRAST 08/22/2023 11:35:26 AM TECHNIQUE: CT of the cervical spine was performed without the administration  of intravenous contrast. Multiplanar reformatted images are provided for review. Automated exposure control, iterative reconstruction, and/or weight based adjustment of the mA/kV was utilized to reduce the radiation dose to as low as reasonably achievable. COMPARISON: CT cervical spine dated 11/08/2022. CLINICAL HISTORY: Neck trauma (Age >= 65y). Triage notes; Pt to ED via [facility] from [facility] unit. Pt had unwittnessed fall, pt found laying on bathroom fall. Pt has lac to right side of head and a skin tear to left elbow, abrasion to right knee. Pt told nursing staff she felt  dizzy, told EMS ; she was just going to get dressed but doesn't remember fall. Pt has bandage in place on head and c-collar in place on arrival. Pt did c/o hip pain, was laying on left hip when EMS arrived. Pt does not take blood thinners. A\T\Ox2, baseline for pt.; ; 147/68; HR 64; 95%; Cbg 127 FINDINGS: CERVICAL SPINE: BONES AND ALIGNMENT: Cervical lordosis is maintained. No spondylolisthesis, no facet subluxation or dislocation. No compression fracture or displaced fracture in the cervical spine. Lumbarization of the first sacral vertebra. DEGENERATIVE CHANGES: Disc osteophyte complexes at multiple levels. There is severe disc space narrowing at C6-7. There is mild spinal canal stenosis at C6-7. Endplate arthrosis and osteophyte hypertrophy at multiple levels. There is significant foraminal stenosis most pronounced at C3-4. SOFT TISSUES: No prevertebral soft tissue swelling. LUNGS: Biapical pleural parenchymal scarring and bilateral pleural calcifications. IMPRESSION: 1. No acute abnormality of the cervical spine related to the reported neck trauma. Electronically signed by: Donnice Mania MD 08/22/2023 11:55 AM EDT RP Workstation: [id number]   CT Head Wo Contrast Result Date: 08/22/2023 EXAM: CT HEAD WITHOUT CONTRAST 08/22/2023 11:35:26 AM TECHNIQUE: CT of the head was performed without the administration of intravenous  contrast. Automated exposure control, iterative reconstruction, and/or weight based adjustment of the mA/kV was utilized to reduce the radiation dose to as low as reasonably achievable. COMPARISON: CT head 115 24 CLINICAL HISTORY: Head trauma, minor (Age >= 65y). Triage notes; Pt to ED via [facility] from [facility] unit. Pt had unwittnessed fall, pt found laying on bathroom fall. Pt has lac to right side of head and a skin tear to left elbow, abrasion to right knee. Pt told nursing staff she felt dizzy, told EMS ; she was just going to get dressed but doesn't remember fall. Pt has bandage in place on head and c-collar in place on arrival. Pt did c/o hip pain, was laying on left hip when EMS arrived. Pt does not take blood thinners. A\T\Ox2, baseline for pt.; ; 147/68; HR 64; 95%; Cbg 127 FINDINGS: BRAIN AND VENTRICLES: No acute hemorrhage. Gray-white differentiation is preserved. No hydrocephalus. No extra-axial collection. No mass effect or midline shift. Nonspecific hypoattenuation in the periventricular and subcortical white matter, most likely representing chronic small vessel disease. Generalized brain volume loss. ORBITS: Bilateral lens replacement. SINUSES: No acute abnormality. SOFT TISSUES AND SKULL: Focal soft tissue swelling and laceration involving the right frontal scalp and forehead. No skull fracture. VASCULATURE: Atherosclerosis of the carotid siphons and intracranial vertebral arteries. IMPRESSION: 1. No acute intracranial abnormality related to the reported head trauma. 2. Focal soft tissue swelling and laceration involving the right frontal scalp and forehead. Electronically signed by: Donnice Mania MD 08/22/2023 11:49 AM EDT RP Workstation: [id number]   DG Pelvis 1-2 Views Result Date: 08/22/2023 CLINICAL DATA:  syncope EXAM: PELVIS - 1-2 VIEW COMPARISON:  January 17, 2022 FINDINGS: Similar alignment of the 3 cortical lag screws transfixing the right femoral neck. Diffuse  osteopenia.No evidence of pelvic fracture or diastasis.No acute hip fracture or dislocation.Soft tissues are unremarkable. IMPRESSION: No acute fracture, pelvic bone diastasis, or dislocation. Electronically Signed   By: Rogelia Myers M.D.   On: 08/22/2023 11:48   DG Chest Port 1 View Result Date: 08/22/2023 CLINICAL DATA:  syncope EXAM: DG CHEST 1V PORT COMPARISON:  February 04, 2023 FINDINGS: Biapical pleural thickening with densely calcified pleural plaques. No focal airspace consolidation, pleural effusion, or pneumothorax. No cardiomegaly. Aortic atherosclerosis. No acute fracture or destructive lesions. Multilevel thoracic  osteophytosis. IMPRESSION: No acute cardiopulmonary abnormality. Electronically Signed   By: Rogelia Myers M.D.   On: 08/22/2023 11:38    ASSESSMENT/PLAN:  Assessment: Principal Problem:   Syncope and collapse Hypertension Anemia Chronic Kidney Disease III Depression  Hyperlipidemia Vitamin D  Deficiency    Tracey Rosario is a 89 y.o. with a pertinent PMH of CKD, HLD, syncope, dementia, and HTN who presented to the ED after a fall that led to head laceration and admitted for syncope.   Plan: Recurrent Syncope EKG Changes   Patient presented with Jarion Hawthorne unwitnessed syncopal episode without prodrome. Events leading up to the fall are still unclear, as patient cannot recall the specifics and she has dementia at baseline. TSH normal range. Orthostatic vitals were positive, but without symptoms. Patient is stable on room air with no chest pain, shortness of breath, fatigue, dyspnea or palpitations.  It is not certain if the EKG changes on admission indicated atrial fibrillation. There were PACs, episodes of asymptomatic bradycardia and dropped beats indicating possible atrioventricular block type II on cardiac monitoring. Initially it was thought there was a component of decreased fluid intake, however it is likely this syncope episode was due to conduction  abnormalities, as the most recent syncopal episode happened while sitting down. - Cardiology consulted  - Exercise precautions with abrupt positional changes, especially standing up  - Encourage adequate hydration and compression socks  - Continue telemetry  - Echo pending  - Will consult OT/PT   AKI Cr 1.32, compared to Cr 1.29 on admission. There is documented history of CKD stage III. Her 2 previous ED visits of syncope showed similar pattern of elevated Cr. There has been no sign of urinary dysfunction; dysuria or hematuria.  - Encourage adequate hydration   - Monitor with BMP   Chronic Conditions: Will hold non-essential home meds until confirmation of medicines.   Hypertension: normotensive Hypothyroidism: anticipate resuming synthroid  shortly Anxiety/Depression: stable  Anemia: Hgb stable Hyperlipidemia GERD   Best Practice: Diet: Regular diet IVF: Fluids: None, Rate: None VTE: enoxaparin  (LOVENOX ) injection 30 mg Start: 08/22/23 1700 Code: DNR  Disposition planning: Family Contact: Daughter, to be notified. DISPO: Anticipated discharge in 2 days to memory unit pending cardiology workup.  Signature:  Ethleen Lormand T Leontine Maris [facility]  11:12 AM, 08/23/2023  On Call pager [phone]

## 2023-08-23 NOTE — Progress Notes (Signed)
 Mobility Specialist Progress Note:    08/23/23 1002  Mobility  Activity Ambulated with assistance  Level of Assistance Other (Comment) (HHA)  Assistive Device None  Distance Ambulated (ft) 150 ft  Activity Response Tolerated fair;RN notified  Mobility Referral Yes  Mobility visit 1 Mobility  Mobility Specialist Start Time (ACUTE ONLY) 1002  Mobility Specialist Stop Time (ACUTE ONLY) 1009  Mobility Specialist Time Calculation (min) (ACUTE ONLY) 7 min   Received pt coming out of restroom. Eager to participate. No c/o any symptoms but did require a break near nursing station to recover. Returned pt to recliner w/ all needs met and daughter in room.   Venetia Keel Mobility Specialist Please Neurosurgeon or Rehab Office at [phone]

## 2023-08-23 NOTE — Consult Note (Signed)
 Cardiology Consultation   Patient ID: Tracey Rosario MRN: [id number]; DOB: 1926-12-10  Admit date: 08/22/2023 Date of Consult: 08/23/2023  PCP:  Tracey Rosario   [facility] Providers Cardiologist:  Vinie JAYSON Maxcy, MD   Patient Profile: Tracey Rosario is a 89 y.o. female with a hx of recurrent syncope, bradycardia hypertension, hypothyroidism, hyperlipidemia, CKD, PVCs, who is being seen 08/23/2023 for the evaluation of syncope/atrial fibrillation at the request of teaching services.  History of Present Illness: Tracey Rosario has a history of frequent PVCs in 2015.  EF was normal at that time.  She has had issues with syncope in which we were managing in 2021 when she had fallen out of her chair.  There was no prodrome at that time, noted to have heart rates in the 40s so atenolol  was discontinued.  2-week monitor was ordered which was benign with no evidence of AV blocks or atrial fibrillation.  We last saw her 06/2019 with overall conservative approach.  More recently she has been seen in the emergency room on a couple occasions for syncope felt related to dehydration as she has had elevated creatinines each time.  Currently patient being evaluated for an episode of syncope although this was unwitnessed.  She resides at a memory care facility and was found to be in the bathroom with a head laceration.  Daughter reports that she has short-term memory loss and not aware of any prodrome symptoms.  Unsure whether or not she lost consciousness.  EKG review shows atrial fibrillation.  She has had multiple x-rays without any acute abnormalities.  Daughter is accompanied with her today.  She reports that she has had somewhat frequent falls at her facility, at least 3 times since being there.  Per daughter and patient they do not report any prodrome such as palpitations, dizziness, lightheadedness, dizziness, but again with no memory of events.   Has not had any shortness of  breath or chest pain.  She does report that she has prior episodes of syncope but this was positional coming from a seated to standing position and has had issues with dizziness with the same presentation.  Chest x-ray, pelvis, CT head and neck negative.  Potassium 4.3, creatinine 1.32.  Troponins negative x 2.  CKD normal, 57.  Hemoglobin 13.0.  Past Medical History:  Diagnosis Date   Acid reflux    Anemia    Arthropathy    Bradycardia    beta blocker reduced in 2021 due to this   CKD (chronic kidney disease), stage III (HCC)    Cognitive decline    referred to neurologist   Depression 10/07/2013   daughter didn't know anything about this   Dizziness    Frequent PVCs    GERD (gastroesophageal reflux disease) 05/04/2013   Hip fx (HCC)    r hip, bilat pubis rami   Hyperlipemia    Hypertension    Hypertensive retinopathy    OU   Hypothyroidism    Insomnia    Macular degeneration    Exu OS   Multilevel degenerative disc disease    Palpitations 05/04/2013   saw cardiologist in 2015, he deleted A. Fib from the record as per note not documentation to support   Premature atrial contractions    PSVT (paroxysmal supraventricular tachycardia) (HCC)    a. short runs by monitor in 2021.   Short-term memory loss    Vitamin D  deficiency     Past Surgical History:  Procedure Laterality Date  ABDOMINAL HYSTERECTOMY  1971   BLADDER SURGERY  2010   Broken Pelvis  2015   C-EYE SURGERY PROCEDURE Bilateral    zaldivar   CATARACT EXTRACTION Bilateral    Done in [place]    EYE SURGERY Bilateral    Cat Sx   HIP PINNING,CANNULATED Right 10/07/2013   Procedure: CANNULATED screws right hip;  Surgeon: Lonni CINDERELLA Poli, MD;  Location: [facility] OR;  Service: Orthopedics;  Laterality: Right;    Scheduled Meds:  enoxaparin  (LOVENOX ) injection  30 mg Subcutaneous Q24H   Continuous Infusions:  sodium chloride      PRN Meds: acetaminophen  **OR** acetaminophen , polyethylene glycol  Allergies:     Allergies  Allergen Reactions   Codeine Other (See Comments)    Altered mental status    Social History:   Social History   Socioeconomic History   Marital status: Widowed    Spouse name: Not on file   Number of children: 2   Years of education: 30   Highest education level: High school graduate  Occupational History   Occupation: Retired  Tobacco Use   Smoking status: Former    Current packs/day: 0.00    Types: Cigarettes    Quit date: 08/02/1970    Years since quitting: 53.0   Smokeless tobacco: Never   Tobacco comments:    smoked very little  Vaping Use   Vaping status: Never Used  Substance and Sexual Activity   Alcohol  use: Never    Alcohol /week: 0.0 standard drinks of alcohol    Drug use: Never   Sexual activity: Not Currently  Other Topics Concern   Not on file  Social History Narrative   Work or School: none      Home Situation: lives alone, daughter lives about 5 minutes away. One daughter lives across the street (bedridden)      Spiritual Beliefs: baptist      Lifestyle: no regular exercise, diet is ok      3 grand children and 3 great grands; 2 1/2 and 3 months       Caffeine: 1-2 cups of coffee/day   Right handed   retired   Chief Executive Officer [employer] Strain: Not on file  Food Insecurity: No Food Insecurity (08/22/2023)   Hunger Vital Sign    Worried About Running Out of Food in the Last Year: Never true    Ran Out of Food in the Last Year: Never true  Transportation Needs: No Transportation Needs (08/22/2023)   PRAPARE - Administrator, Civil Service (Medical): No    Lack of Transportation (Non-Medical): No  Physical Activity: Not on file  Stress: Not on file  Social Connections: Moderately Isolated (08/22/2023)   Social Connection and Isolation Panel    Frequency of Communication with Friends and Family: More than three times a week    Frequency of Social Gatherings with Friends and Family: More than three  times a week    Attends Religious Services: More than 4 times per year    Active Member of [employer] or Organizations: No    Attends Banker Meetings: Never    Marital Status: Widowed  Intimate Partner Violence: Unknown (08/22/2023)   Humiliation, Afraid, Rape, and Kick questionnaire    Fear of Current or Ex-Partner: No    Emotionally Abused: No    Physically Abused: Not on file    Sexually Abused: No    Family History:    Family History  Problem Relation Age of  Onset   Prostate cancer Father    Heart attack Sister    Cancer Brother    Cancer Sister    Heart attack Sister    Cancer Sister    Stroke Daughter    Dementia Neg Hx    Alzheimer's disease Neg Hx      ROS:  Please see the history of present illness.  All other ROS reviewed and negative.     Physical Exam/Data: Vitals:   08/22/23 1700 08/23/23 0005 08/23/23 0600 08/23/23 0730  BP: 133/69 (!) 144/55 (!) 156/50 (!) 136/46  Pulse: 81 (!) 56 68 94  Resp: (!) 23 19 20 18   Temp:  (!) 97.5 F (36.4 C) 97.9 F (36.6 C) 97.7 F (36.5 C)  TempSrc:  Oral Oral Oral  SpO2: 96%  91% 94%  Weight:  72.1 kg 71.2 kg   Height:        Intake/Output Summary (Last 24 hours) at 08/23/2023 1149 Last data filed at 08/23/2023 0903 Gross per 24 hour  Intake 240 ml  Output 600 ml  Net -360 ml      08/23/2023    6:00 AM 08/23/2023   12:05 AM 08/22/2023   10:08 AM  Last 3 Weights  Weight (lbs) 156 lb 15.5 oz 158 lb 15.2 oz 156 lb 8.4 oz  Weight (kg) 71.2 kg 72.1 kg 71 kg     Body mass index is 21.89 kg/m.  General:  Well nourished, well developed, in no acute distress HEENT: normal Neck: no JVD Vascular: No carotid bruits; Distal pulses 2+ bilaterally Cardiac:  IRRR, 2/6 murmur Lungs:  clear to auscultation bilaterally, no wheezing, rhonchi or rales  Abd: soft, nontender, no hepatomegaly  Ext: no edema Musculoskeletal:  No deformities, BUE and BLE strength normal and equal Skin: warm and dry  Neuro:  CNs 2-12  intact, no focal abnormalities noted Psych:  Normal affect   EKG:  The EKG was personally reviewed and demonstrates: Atrial fibrillation heart rate 61 Telemetry:  Telemetry was personally reviewed and demonstrates: Atrial fibrillation, SVT HR 50s-120s  Relevant CV Studies: Echocardiogram 08/23/2023  1. Left ventricular ejection fraction, by estimation, is 65 to 70%. The  left ventricle has normal function. The left ventricle has no regional  wall motion abnormalities. There is mild concentric left ventricular  hypertrophy. Left ventricular diastolic  parameters are indeterminate.   2. Right ventricular systolic function is normal. The right ventricular  size is mildly enlarged. Tricuspid regurgitation signal is inadequate for  assessing PA pressure.   3. Left atrial size was moderately dilated.   4. Right atrial size was moderately dilated.   5. The mitral valve is normal in structure. Trivial mitral valve  regurgitation.   6. The aortic valve is tricuspid. There is mild calcification of the  aortic valve. Aortic valve regurgitation is trivial. Aortic valve  sclerosis/calcification is present, without any evidence of aortic  stenosis.   Laboratory Data: High Sensitivity Troponin:   Recent Labs  Lab 08/22/23 1124 08/22/23 1359  TROPONINIHS 9 12     Chemistry Recent Labs  Lab 08/22/23 1124 08/23/23 0317  NA 140 139  K 4.8 4.3  CL 103 105  CO2 24 24  GLUCOSE 102* 107*  BUN 16 17  CREATININE 1.29* 1.32*  CALCIUM 10.0 9.2  GFRNONAA 38* 37*  ANIONGAP 13 10    No results for input(s): PROT, ALBUMIN, AST, ALT, ALKPHOS, BILITOT in the last 168 hours. Lipids No results for input(s): CHOL, TRIG, HDL,  LABVLDL, LDLCALC, CHOLHDL in the last 168 hours.  Hematology Recent Labs  Lab 08/22/23 1124 08/23/23 0317  WBC 8.2 7.5  RBC 4.92 4.45  HGB 14.3 13.0  HCT 43.5 38.6  MCV 88.4 86.7  MCH 29.1 29.2  MCHC 32.9 33.7  RDW 13.4 13.4  PLT 251 257    Thyroid   Recent Labs  Lab 08/22/23 1359  TSH 3.153    BNPNo results for input(s): BNP, PROBNP in the last 168 hours.  DDimer No results for input(s): DDIMER in the last 168 hours.  Radiology/Studies:  ECHOCARDIOGRAM COMPLETE Result Date: 08/23/2023    ECHOCARDIOGRAM REPORT   Patient Name:   Tracey Rosario Date of Exam: 08/23/2023 Medical Rec #:  [id number]                 Height:       71.0 in Accession #:    [id number]                Weight:       157.0 lb Date of Birth:  22-Oct-1926                 BSA:          1.902 m Patient Age:    89 years                  BP:           136/46 mmHg Patient Gender: F                         HR:           79 bpm. Exam Location:  Inpatient Procedure: 2D Echo, Cardiac Doppler, Color Doppler and Intracardiac            Opacification Agent (Both Spectral and Color Flow Doppler were            utilized during procedure). Indications:    I48.91* Unspecified atrial fibrillation  History:        Patient has prior history of Echocardiogram examinations, most                 recent 01/06/2022. CKD; Risk Factors:Hypertension and                 Dyslipidemia.  Sonographer:    Damien Senior RDCS Referring Phys: LONNI A[place] IMPRESSIONS  1. Left ventricular ejection fraction, by estimation, is 65 to 70%. The left ventricle has normal function. The left ventricle has no regional wall motion abnormalities. There is mild concentric left ventricular hypertrophy. Left ventricular diastolic parameters are indeterminate.  2. Right ventricular systolic function is normal. The right ventricular size is mildly enlarged. Tricuspid regurgitation signal is inadequate for assessing PA pressure.  3. Left atrial size was moderately dilated.  4. Right atrial size was moderately dilated.  5. The mitral valve is normal in structure. Trivial mitral valve regurgitation.  6. The aortic valve is tricuspid. There is mild calcification of the aortic valve. Aortic valve regurgitation  is trivial. Aortic valve sclerosis/calcification is present, without any evidence of aortic stenosis. FINDINGS  Left Ventricle: Left ventricular ejection fraction, by estimation, is 65 to 70%. The left ventricle has normal function. The left ventricle has no regional wall motion abnormalities. Definity  contrast agent was given IV to delineate the left ventricular  endocardial borders. The left ventricular internal cavity size was normal in size. There is mild concentric left ventricular hypertrophy. Left  ventricular diastolic function could not be evaluated due to atrial fibrillation. Left ventricular diastolic parameters are indeterminate. Right Ventricle: The right ventricular size is mildly enlarged. No increase in right ventricular wall thickness. Right ventricular systolic function is normal. Tricuspid regurgitation signal is inadequate for assessing PA pressure. Left Atrium: Left atrial size was moderately dilated. Right Atrium: Right atrial size was moderately dilated. Pericardium: There is no evidence of pericardial effusion. Presence of epicardial fat layer. Mitral Valve: The mitral valve is normal in structure. Trivial mitral valve regurgitation. Tricuspid Valve: The tricuspid valve is normal in structure. Tricuspid valve regurgitation is trivial. Aortic Valve: The aortic valve is tricuspid. There is mild calcification of the aortic valve. Aortic valve regurgitation is trivial. Aortic valve sclerosis/calcification is present, without any evidence of aortic stenosis. Pulmonic Valve: The pulmonic valve was grossly normal. Pulmonic valve regurgitation is trivial. Aorta: The aortic root and ascending aorta are structurally normal, with no evidence of dilitation. Venous: The inferior vena cava was not well visualized. IAS/Shunts: No atrial level shunt detected by color flow Doppler.  LEFT VENTRICLE PLAX 2D LVIDd:         2.50 cm LVIDs:         1.85 cm LV PW:         1.20 cm LV IVS:        1.20 cm LVOT diam:      2.10 cm LV SV:         68 LV SV Index:   36 LVOT Area:     3.46 cm  RIGHT VENTRICLE RV S prime:     11.70 cm/s TAPSE (M-mode): 1.6 cm LEFT ATRIUM             Index        RIGHT ATRIUM           Index LA diam:        3.30 cm 1.73 cm/m   RA Area:     23.70 cm LA Vol (A2C):   63.1 ml 33.17 ml/m  RA Volume:   77.70 ml  40.85 ml/m LA Vol (A4C):   85.6 ml 45.00 ml/m LA Biplane Vol: 76.1 ml 40.01 ml/m  AORTIC VALVE LVOT Vmax:   117.00 cm/s LVOT Vmean:  80.200 cm/s LVOT VTI:    0.197 m  AORTA Ao Root diam: 3.10 cm Ao Asc diam:  3.30 cm  SHUNTS Systemic VTI:  0.20 m Systemic Diam: 2.10 cm Toribio Fuel MD Electronically signed by Toribio Fuel MD Signature Date/Time: 08/23/2023/9:56:34 AM    Final    CT Cervical Spine Wo Contrast Result Date: 08/22/2023 EXAM: CT CERVICAL SPINE WITHOUT CONTRAST 08/22/2023 11:35:26 AM TECHNIQUE: CT of the cervical spine was performed without the administration of intravenous contrast. Multiplanar reformatted images are provided for review. Automated exposure control, iterative reconstruction, and/or weight based adjustment of the mA/kV was utilized to reduce the radiation dose to as low as reasonably achievable. COMPARISON: CT cervical spine dated 11/08/2022. CLINICAL HISTORY: Neck trauma (Age >= 65y). Triage notes; Pt to ED via [facility] from [facility] unit. Pt had unwittnessed fall, pt found laying on bathroom fall. Pt has lac to right side of head and a skin tear to left elbow, abrasion to right knee. Pt told nursing staff she felt dizzy, told EMS ; she was just going to get dressed but doesn't remember fall. Pt has bandage in place on head and c-collar in place on arrival. Pt did c/o hip pain, was laying on  left hip when EMS arrived. Pt does not take blood thinners. A\T\Ox2, baseline for pt.; ; 147/68; HR 64; 95%; Cbg 127 FINDINGS: CERVICAL SPINE: BONES AND ALIGNMENT: Cervical lordosis is maintained. No spondylolisthesis, no facet subluxation or dislocation. No  compression fracture or displaced fracture in the cervical spine. Lumbarization of the first sacral vertebra. DEGENERATIVE CHANGES: Disc osteophyte complexes at multiple levels. There is severe disc space narrowing at C6-7. There is mild spinal canal stenosis at C6-7. Endplate arthrosis and osteophyte hypertrophy at multiple levels. There is significant foraminal stenosis most pronounced at C3-4. SOFT TISSUES: No prevertebral soft tissue swelling. LUNGS: Biapical pleural parenchymal scarring and bilateral pleural calcifications. IMPRESSION: 1. No acute abnormality of the cervical spine related to the reported neck trauma. Electronically signed by: Donnice Mania MD 08/22/2023 11:55 AM EDT RP Workstation: [id number]   CT Head Wo Contrast Result Date: 08/22/2023 EXAM: CT HEAD WITHOUT CONTRAST 08/22/2023 11:35:26 AM TECHNIQUE: CT of the head was performed without the administration of intravenous contrast. Automated exposure control, iterative reconstruction, and/or weight based adjustment of the mA/kV was utilized to reduce the radiation dose to as low as reasonably achievable. COMPARISON: CT head 115 24 CLINICAL HISTORY: Head trauma, minor (Age >= 65y). Triage notes; Pt to ED via [facility] from [facility] unit. Pt had unwittnessed fall, pt found laying on bathroom fall. Pt has lac to right side of head and a skin tear to left elbow, abrasion to right knee. Pt told nursing staff she felt dizzy, told EMS ; she was just going to get dressed but doesn't remember fall. Pt has bandage in place on head and c-collar in place on arrival. Pt did c/o hip pain, was laying on left hip when EMS arrived. Pt does not take blood thinners. A\T\Ox2, baseline for pt.; ; 147/68; HR 64; 95%; Cbg 127 FINDINGS: BRAIN AND VENTRICLES: No acute hemorrhage. Gray-white differentiation is preserved. No hydrocephalus. No extra-axial collection. No mass effect or midline shift. Nonspecific hypoattenuation in the periventricular and  subcortical white matter, most likely representing chronic small vessel disease. Generalized brain volume loss. ORBITS: Bilateral lens replacement. SINUSES: No acute abnormality. SOFT TISSUES AND SKULL: Focal soft tissue swelling and laceration involving the right frontal scalp and forehead. No skull fracture. VASCULATURE: Atherosclerosis of the carotid siphons and intracranial vertebral arteries. IMPRESSION: 1. No acute intracranial abnormality related to the reported head trauma. 2. Focal soft tissue swelling and laceration involving the right frontal scalp and forehead. Electronically signed by: Donnice Mania MD 08/22/2023 11:49 AM EDT RP Workstation: [id number]   DG Pelvis 1-2 Views Result Date: 08/22/2023 CLINICAL DATA:  syncope EXAM: PELVIS - 1-2 VIEW COMPARISON:  January 17, 2022 FINDINGS: Similar alignment of the 3 cortical lag screws transfixing the right femoral neck. Diffuse osteopenia.No evidence of pelvic fracture or diastasis.No acute hip fracture or dislocation.Soft tissues are unremarkable. IMPRESSION: No acute fracture, pelvic bone diastasis, or dislocation. Electronically Signed   By: Rogelia Myers M.D.   On: 08/22/2023 11:48   DG Chest Port 1 View Result Date: 08/22/2023 CLINICAL DATA:  syncope EXAM: DG CHEST 1V PORT COMPARISON:  February 04, 2023 FINDINGS: Biapical pleural thickening with densely calcified pleural plaques. No focal airspace consolidation, pleural effusion, or pneumothorax. No cardiomegaly. Aortic atherosclerosis. No acute fracture or destructive lesions. Multilevel thoracic osteophytosis. IMPRESSION: No acute cardiopulmonary abnormality. Electronically Signed   By: Rogelia Myers M.D.   On: 08/22/2023 11:38     Assessment and Plan:  Recurrent syncope - Previous heart monitor 05/2019 benign  Unclear if she lost consciousness or not.  She was found down in the bathroom with head laceration.  She has had recurrent episodes syncope since at least 2021.  This is her third  admission since February 2025 for same issue felt related to dehydration which seems consistent with this admissions presentation as well. + orthostatics.  On telemetry she does have variable heart rates that do range from 50s-110s with atrial fibrillation and RVR episodes that look like flutter on telemetry. May be contributing, although not likely the main driver of her current presentation. Echocardiogram here with preserved biventricular function with no significant valvular disease.   Encourage better hydration and closer monitoring at facility.  Would avoid quick positional changes. May benefit from leg compressions too.   New onset atrial fibrillation EKG February 2025 looks to be A-fib, June EKG sinus.  Here now with A-fib/flutter with variable conduction.  Asymptomatic, rates overall are decently controlled less than 110 but intermittent RVR when she conducts 2-1. Decision of anticoagulation is difficult as she has had frequent falls and therefore with increased bleeding risk.  CHA2DS2-VASc is 4 indicating 4.8% annual risk for stroke, I think more conservative therapy is appropriate and would defer DOAC. Daughter agrees.  Beta-blocker stopped previously due to bradycardia.  Discussed with Dr. Raford will start amio 100mg  daily to hopefully control her swings in heart rates.  TSH normal this admission.    Risk Assessment/Risk Scores:  CHA2DS2-VASc Score = 4  This indicates a 4.8% annual risk of stroke. The patient's score is based upon: CHF History: 0 HTN History: 1 Diabetes History: 0 Stroke History: 0 Vascular Disease History: 0 Age Score: 2 Gender Score: 1    For questions or updates, please contact [facility] Please consult [email or web address] for contact info under    Signed, Thom LITTIE Sluder, PA-C  08/23/2023 11:49 AM

## 2023-08-23 NOTE — Telephone Encounter (Signed)
 Please order 30 day heart monitor when patient is discharged for atrial flutter. Dr. Raford to read.

## 2023-08-23 NOTE — Progress Notes (Signed)
 Echocardiogram 2D Echocardiogram has been performed.  Tracey Rosario Ryelan Kazee RDCS 08/23/2023, 8:23 AM

## 2023-08-23 NOTE — Evaluation (Signed)
 Physical Therapy Evaluation Patient Details Name: Tracey Rosario MRN: [id number] DOB: 27-Jan-1926 Today's Date: 08/23/2023  History of Present Illness  Pt is a 89 y.o. female who presented 08/22/23 s/p syncope. Imaging negative for acute injuries. PMH: anemia, arthropathy, bradycardia, CKD, cognitive decline, depression, GERD, HLD, HTN, hypertensive retinopathy, hypothyroidism, insomnia, macular degeneration, PSVT, short-term memory loss, dementia   Clinical Impression  Pt presents with condition above and deficits mentioned below, see PT Problem List. Pt is a poor historian with a hx of dementia. No family is present to confirm PLOF, but pt reports at baseline she is mod I for functional mobility intermittently using a rollator but often no AD. Her chart reveals she lives at a memory care facility. Currently, she displays deficits in balance, generalized strength, power, and cognition. She is at risk for falls, but only needs CGA for safety with all functional mobility. She did display better balance when using a RW rather than no AD. Recommend she use her rollator at d/c for all standing mobility at this time. As pt appears near but not quite at her baseline, recommending pt return to her memory care facility with HHPT follow-up. Will continue to follow acutely. Of note, pt had a BM during the session, and it appeared very dark if not black. Notified RN.   Vitals -  138/63 & 81 bpm supine 161/96 & 96 bpm sitting 152/137 & 113 bpm standing 131/67 & 115 bpm standing 3 min 131/79 sitting end of session         If plan is discharge home, recommend the following: A little help with walking and/or transfers;A little help with bathing/dressing/bathroom;Assistance with cooking/housework;Direct supervision/assist for medications management;Direct supervision/assist for financial management;Assist for transportation;Supervision due to cognitive status   Can travel by private vehicle         Equipment Recommendations Rollator (4 wheels);Other (comment) (shower seat - if she does not already have them)  Recommendations for Other Services       Functional Status Assessment Patient has had a recent decline in their functional status and demonstrates the ability to make significant improvements in function in a reasonable and predictable amount of time.     Precautions / Restrictions Precautions Precautions: Fall Recall of Precautions/Restrictions: Impaired Restrictions Weight Bearing Restrictions Per Provider Order: No      Mobility  Bed Mobility Overal bed mobility: Needs Assistance Bed Mobility: Supine to Sit     Supine to sit: Contact guard, HOB elevated     General bed mobility comments: Extra time and effort noted to ascend trunk to sit up R EOB, CGA for safety, HOB elevated    Transfers Overall transfer level: Needs assistance Equipment used: None Transfers: Sit to/from Stand Sit to Stand: Contact guard assist           General transfer comment: Extra time and effort powering up to stand and gaining balance each rep. 1x from EOB to no AD and 1x from low commode using wall rail. CGA for safety    Ambulation/Gait Ambulation/Gait assistance: Contact guard assist Gait Distance (Feet): 100 Feet (x2 bouts of approximately 15 ft > approximately 100 ft) Assistive device: Rolling walker (2 wheels), None Gait Pattern/deviations: Step-through pattern, Decreased stride length, Shuffle Gait velocity: reduced Gait velocity interpretation: <1.8 ft/sec, indicate of risk for recurrent falls   General Gait Details: Pt initially ambulating without an AD, intermittently reaching for furniture for support. Pt displayed improved balance when using the RW. Pt takes slow, shuffling steps, displaying a  tendency to mildly flex laterally to the R when standing. No LOB, CGA for safety  Stairs            Wheelchair Mobility     Tilt Bed    Modified Rankin (Stroke  Patients Only)       Balance Overall balance assessment: Mild deficits observed, not formally tested                                           Pertinent Vitals/Pain Pain Assessment Pain Assessment: No/denies pain    Home Living Family/patient expects to be discharged to:: Assisted living (memory care facility per chart)                 Home Equipment: Rollator (4 wheels)      Prior Function Prior Level of Function : Needs assist;History of Falls (last six months);Patient poor historian/Family not available             Mobility Comments: Pt reports intermittent use of rollator but often no AD; hx of falls ADLs Comments: Likely needed assistance for cognitive tasks, like managing meds, due to hx if dementia.     Extremity/Trunk Assessment   Upper Extremity Assessment Upper Extremity Assessment: Defer to OT evaluation    Lower Extremity Assessment Lower Extremity Assessment: Generalized weakness    Cervical / Trunk Assessment Cervical / Trunk Assessment: Kyphotic  Communication   Communication Communication: No apparent difficulties    Cognition Arousal: Alert Behavior During Therapy: WFL for tasks assessed/performed   PT - Cognitive impairments: History of cognitive impairments                       PT - Cognition Comments: Hx of dementia per chart. Pt not able to fully recall her PLOF, reporting I think so when asked if she usually uses her rollator to ambulate to the dining hall. Pt pleasant and cooperative Following commands: Impaired Following commands impaired: Follows multi-step commands with increased time     Cueing Cueing Techniques: Verbal cues     General Comments General comments (skin integrity, edema, etc.): Vitals - 138/63 & 81 bpm supine, 161/96 & 96 bpm sitting, 152/137 & 113 bpm standing, 131/67 & 115 bpm standing 3 min, 131/79 sitting end of session    Exercises     Assessment/Plan    PT  Assessment Patient needs continued PT services  PT Problem List Decreased strength;Decreased activity tolerance;Decreased balance;Decreased mobility;Decreased cognition       PT Treatment Interventions DME instruction;Gait training;Functional mobility training;Therapeutic activities;Therapeutic exercise;Balance training;Neuromuscular re-education;Cognitive remediation;Patient/family education    PT Goals (Current goals can be found in the Care Plan section)  Acute Rehab PT Goals Patient Stated Goal: to get stronger PT Goal Formulation: With patient Time For Goal Achievement: 09/06/23 Potential to Achieve Goals: Good    Frequency Min 1X/week     Co-evaluation               AM-PAC PT 6 Clicks Mobility  Outcome Measure Help needed turning from your back to your side while in a flat bed without using bedrails?: A Little Help needed moving from lying on your back to sitting on the side of a flat bed without using bedrails?: A Little Help needed moving to and from a bed to a chair (including a wheelchair)?: A Little Help needed standing up from a  chair using your arms (e.g., wheelchair or bedside chair)?: A Little Help needed to walk in hospital room?: A Little Help needed climbing 3-5 steps with a railing? : A Little 6 Click Score: 18    End of Session Equipment Utilized During Treatment: Gait belt Activity Tolerance: Patient tolerated treatment well Patient left: in chair;with call bell/phone within reach;with chair alarm set;Other (comment) (with floor mat down) Nurse Communication: Mobility status;Other (comment) (dark BM noted; vitals) PT Visit Diagnosis: Unsteadiness on feet (R26.81);Other abnormalities of gait and mobility (R26.89);Muscle weakness (generalized) (M62.81);History of falling (Z91.81);Difficulty in walking, not elsewhere classified (R26.2)    Time: 9179-9154 PT Time Calculation (min) (ACUTE ONLY): 25 min   Charges:   PT Evaluation $PT Eval Moderate  Complexity: 1 Mod PT Treatments $Therapeutic Activity: 8-22 mins PT General Charges $$ ACUTE PT VISIT: 1 Visit         Theo Ferretti, PT, DPT Acute Rehabilitation Services  Office: [phone]   Theo CHRISTELLA Ferretti 08/23/2023, 9:04 AM

## 2023-08-24 DIAGNOSIS — I4891 Unspecified atrial fibrillation: Secondary | ICD-10-CM

## 2023-08-24 DIAGNOSIS — I951 Orthostatic hypotension: Secondary | ICD-10-CM

## 2023-08-24 DIAGNOSIS — I483 Typical atrial flutter: Principal | ICD-10-CM

## 2023-08-24 LAB — BASIC METABOLIC PANEL WITH GFR
Anion gap: 12 (ref 5–15)
Anion gap: 12 (ref 5–15)
BUN: 26 mg/dL — ABNORMAL HIGH (ref 8–23)
BUN: 22 mg/dL (ref 8–23)
CO2: 25 mmol/L (ref 22–32)
CO2: 24 mmol/L (ref 22–32)
Calcium: 9.2 mg/dL (ref 8.9–10.3)
Calcium: 9.5 mg/dL (ref 8.9–10.3)
Chloride: 102 mmol/L (ref 98–111)
Chloride: 103 mmol/L (ref 98–111)
Creatinine, Ser: 1.83 mg/dL — ABNORMAL HIGH (ref 0.44–1.00)
Creatinine, Ser: 1.53 mg/dL — ABNORMAL HIGH (ref 0.44–1.00)
GFR, Estimated: 25 mL/min — ABNORMAL LOW (ref >60)
GFR, Estimated: 31 mL/min — ABNORMAL LOW (ref >60)
Glucose, Bld: 101 mg/dL — ABNORMAL HIGH (ref 70–99)
Glucose, Bld: 110 mg/dL — ABNORMAL HIGH (ref 70–99)
Potassium: 4.7 mmol/L (ref 3.5–5.1)
Potassium: 4.4 mmol/L (ref 3.5–5.1)
Sodium: 139 mmol/L (ref 135–145)
Sodium: 139 mmol/L (ref 135–145)

## 2023-08-24 LAB — CBC
HCT: 35.6 % — ABNORMAL LOW (ref 36.0–46.0)
Hemoglobin: 11.7 g/dL — ABNORMAL LOW (ref 12.0–15.0)
MCH: 29 pg (ref 26.0–34.0)
MCHC: 32.9 g/dL (ref 30.0–36.0)
MCV: 88.1 fL (ref 80.0–100.0)
Platelets: 222 K/uL (ref 150–400)
RBC: 4.04 MIL/uL (ref 3.87–5.11)
RDW: 13.8 % (ref 11.5–15.5)
WBC: 6.6 K/uL (ref 4.0–10.5)
nRBC: 0 % (ref 0.0–0.2)

## 2023-08-24 LAB — SODIUM, URINE, RANDOM: Sodium, Ur: 57 mmol/L

## 2023-08-24 LAB — CREATININE, URINE, RANDOM: Creatinine, Urine: 175 mg/dL

## 2023-08-24 MED ORDER — AMIODARONE HCL 100 MG PO TABS: 50.0000 mg | ORAL_TABLET | Freq: Every day | ORAL | Status: DC

## 2023-08-24 MED ORDER — AMLODIPINE BESYLATE 5 MG PO TABS
5.0000 mg | ORAL_TABLET | Freq: Every day | ORAL | Status: DC
  Administered 2023-08-24 – 2023-08-25 (×2): 5 mg via ORAL
  Filled 2023-08-24 (×2): qty 1

## 2023-08-24 NOTE — TOC Progression Note (Signed)
 Transition of Care [facility]) - Progression Note    Patient Details  Name: Tracey Rosario MRN: [id number] Date of Birth: 1926/10/23  [facility] CM/SW Contact  [name], [place] Phone Number: 08/24/2023, 10:49 AM  Clinical Narrative:   CSW spoke with memory care admissions, Charity (cell: [phone]), in regard to patient discharging back to memory care at [facility]. Charity stated she will need CSW to fax [phone]) cosigned FL2, DC summary, and HH order to her at time of discharge.   CSW will continue to follow.    Expected Discharge Plan: Assisted Living (with HHPT) Barriers to Discharge: Continued Medical Work up               Expected Discharge Plan and Services In-house Referral: Clinical Social Work     Living arrangements for the past 2 months: Assisted Living Facility                                       Social Drivers of Health (SDOH) Interventions SDOH Screenings   Food Insecurity: No Food Insecurity (08/22/2023)  Housing: Low Risk  (08/22/2023)  Transportation Needs: No Transportation Needs (08/22/2023)  Utilities: Not At Risk (08/22/2023)  Social Connections: Moderately Isolated (08/22/2023)  Tobacco Use: Medium Risk (08/22/2023)    Readmission Risk Interventions    01/12/2022    1:09 PM  Readmission Risk Prevention Plan  Transportation Screening Complete  PCP or Specialist Appt within 3-5 Days Complete  HRI or Home Care Consult Complete  Social Work Consult for Recovery Care Planning/Counseling Complete  Palliative Care Screening Complete  Medication Review Oceanographer) Complete

## 2023-08-24 NOTE — Plan of Care (Signed)

## 2023-08-24 NOTE — Progress Notes (Signed)
 Rounding Note   Patient Name: Tracey Rosario Date of Encounter: 08/24/2023  [facility] Cardiologist: Vinie JAYSON Maxcy, MD   Subjective Feeling well.  Eager to go home.   Scheduled Meds:  amiodarone   50 mg Oral Daily   amLODipine   5 mg Oral Daily   artificial tears  1 drop Both Eyes TID   citalopram   20 mg Oral Daily   enoxaparin  (LOVENOX ) injection  30 mg Subcutaneous Q24H   ferrous sulfate   325 mg Oral Daily   levothyroxine   75 mcg Oral QAC breakfast   mirtazapine   7.5 mg Oral QHS   pantoprazole   40 mg Oral Daily   Continuous Infusions:  sodium chloride      PRN Meds: acetaminophen  **OR** acetaminophen , polyethylene glycol   Vital Signs  Vitals:   08/23/23 1946 08/24/23 0026 08/24/23 0518 08/24/23 0555  BP: (!) 154/56 (!) 154/69 (!) 183/70 (!) 180/63  Pulse: 61 (!) 55 (!) 59   Resp: 18 18 18    Temp: 97.9 F (36.6 C) 97.7 F (36.5 C) 98.4 F (36.9 C)   TempSrc: Oral Oral Oral   SpO2: 92% 97% 94%   Weight:      Height:        Intake/Output Summary (Last 24 hours) at 08/24/2023 0953 Last data filed at 08/24/2023 0517 Gross per 24 hour  Intake 120 ml  Output 150 ml  Net -30 ml      08/23/2023    6:00 AM 08/23/2023   12:05 AM 08/22/2023   10:08 AM  Last 3 Weights  Weight (lbs) 156 lb 15.5 oz 158 lb 15.2 oz 156 lb 8.4 oz  Weight (kg) 71.2 kg 72.1 kg 71 kg      Telemetry Atrial flutter transitioned to sinus rhythm/sinus bradycardia. - Personally Reviewed  ECG  N/a - Personally Reviewed  Physical Exam  VS:  BP (!) 180/63   Pulse (!) 59   Temp 98.4 F (36.9 C) (Oral)   Resp 18   Ht 5' 11 (1.803 m)   Wt 71.2 kg   SpO2 94%   BMI 21.89 kg/m  , BMI Body mass index is 21.89 kg/m. GENERAL:  Well appearing HEENT: Pupils equal round and reactive, fundi not visualized, oral mucosa unremarkable NECK:  No jugular venous distention, waveform within normal limits, carotid upstroke brisk and symmetric, no bruits, no thyromegaly LUNGS:   Clear to auscultation bilaterally HEART:  RRR.  PMI not displaced or sustained,S1 and S2 within normal limits, no S3, no S4, no clicks, no rubs, no murmurs ABD:  Flat, positive bowel sounds normal in frequency in pitch, no bruits, no rebound, no guarding, no midline pulsatile mass, no hepatomegaly, no splenomegaly EXT:  2 plus pulses throughout, no edema, no cyanosis no clubbing SKIN:  No rashes no nodules NEURO:  Cranial nerves II through XII grossly intact, motor grossly intact throughout PSYCH:  Cognitively intact, oriented to person place and time   Labs High Sensitivity Troponin:   Recent Labs  Lab 08/22/23 1124 08/22/23 1359  TROPONINIHS 9 12     Chemistry Recent Labs  Lab 08/22/23 1124 08/23/23 0317 08/24/23 0300  NA 140 139 139  K 4.8 4.3 4.7  CL 103 105 102  CO2 24 24 25   GLUCOSE 102* 107* 101*  BUN 16 17 26*  CREATININE 1.29* 1.32* 1.83*  CALCIUM 10.0 9.2 9.2  GFRNONAA 38* 37* 25*  ANIONGAP 13 10 12     Lipids No results for input(s): CHOL, TRIG, HDL, LABVLDL,  LDLCALC, CHOLHDL in the last 168 hours.  Hematology Recent Labs  Lab 08/22/23 1124 08/23/23 0317 08/24/23 0300  WBC 8.2 7.5 6.6  RBC 4.92 4.45 4.04  HGB 14.3 13.0 11.7*  HCT 43.5 38.6 35.6*  MCV 88.4 86.7 88.1  MCH 29.1 29.2 29.0  MCHC 32.9 33.7 32.9  RDW 13.4 13.4 13.8  PLT 251 257 222   Thyroid   Recent Labs  Lab 08/22/23 1359  TSH 3.153    BNPNo results for input(s): BNP, PROBNP in the last 168 hours.  DDimer No results for input(s): DDIMER in the last 168 hours.   Radiology  ECHOCARDIOGRAM COMPLETE Result Date: 08/23/2023    ECHOCARDIOGRAM REPORT   Patient Name:   Tracey Rosario Date of Exam: 08/23/2023 Medical Rec #:  [id number]                 Height:       71.0 in Accession #:    [id number]                Weight:       157.0 lb Date of Birth:  Jan 23, 1926                 BSA:          1.902 m Patient Age:    89 years                  BP:           136/46 mmHg  Patient Gender: F                         HR:           79 bpm. Exam Location:  Inpatient Procedure: 2D Echo, Cardiac Doppler, Color Doppler and Intracardiac            Opacification Agent (Both Spectral and Color Flow Doppler were            utilized during procedure). Indications:    I48.91* Unspecified atrial fibrillation  History:        Patient has prior history of Echocardiogram examinations, most                 recent 01/06/2022. CKD; Risk Factors:Hypertension and                 Dyslipidemia.  Sonographer:    Damien Senior RDCS Referring Phys: LONNI A[place] IMPRESSIONS  1. Left ventricular ejection fraction, by estimation, is 65 to 70%. The left ventricle has normal function. The left ventricle has no regional wall motion abnormalities. There is mild concentric left ventricular hypertrophy. Left ventricular diastolic parameters are indeterminate.  2. Right ventricular systolic function is normal. The right ventricular size is mildly enlarged. Tricuspid regurgitation signal is inadequate for assessing PA pressure.  3. Left atrial size was moderately dilated.  4. Right atrial size was moderately dilated.  5. The mitral valve is normal in structure. Trivial mitral valve regurgitation.  6. The aortic valve is tricuspid. There is mild calcification of the aortic valve. Aortic valve regurgitation is trivial. Aortic valve sclerosis/calcification is present, without any evidence of aortic stenosis. FINDINGS  Left Ventricle: Left ventricular ejection fraction, by estimation, is 65 to 70%. The left ventricle has normal function. The left ventricle has no regional wall motion abnormalities. Definity  contrast agent was given IV to delineate the left ventricular  endocardial borders. The left ventricular internal cavity size was normal  in size. There is mild concentric left ventricular hypertrophy. Left ventricular diastolic function could not be evaluated due to atrial fibrillation. Left ventricular diastolic  parameters are indeterminate. Right Ventricle: The right ventricular size is mildly enlarged. No increase in right ventricular wall thickness. Right ventricular systolic function is normal. Tricuspid regurgitation signal is inadequate for assessing PA pressure. Left Atrium: Left atrial size was moderately dilated. Right Atrium: Right atrial size was moderately dilated. Pericardium: There is no evidence of pericardial effusion. Presence of epicardial fat layer. Mitral Valve: The mitral valve is normal in structure. Trivial mitral valve regurgitation. Tricuspid Valve: The tricuspid valve is normal in structure. Tricuspid valve regurgitation is trivial. Aortic Valve: The aortic valve is tricuspid. There is mild calcification of the aortic valve. Aortic valve regurgitation is trivial. Aortic valve sclerosis/calcification is present, without any evidence of aortic stenosis. Pulmonic Valve: The pulmonic valve was grossly normal. Pulmonic valve regurgitation is trivial. Aorta: The aortic root and ascending aorta are structurally normal, with no evidence of dilitation. Venous: The inferior vena cava was not well visualized. IAS/Shunts: No atrial level shunt detected by color flow Doppler.  LEFT VENTRICLE PLAX 2D LVIDd:         2.50 cm LVIDs:         1.85 cm LV PW:         1.20 cm LV IVS:        1.20 cm LVOT diam:     2.10 cm LV SV:         68 LV SV Index:   36 LVOT Area:     3.46 cm  RIGHT VENTRICLE RV S prime:     11.70 cm/s TAPSE (M-mode): 1.6 cm LEFT ATRIUM             Index        RIGHT ATRIUM           Index LA diam:        3.30 cm 1.73 cm/m   RA Area:     23.70 cm LA Vol (A2C):   63.1 ml 33.17 ml/m  RA Volume:   77.70 ml  40.85 ml/m LA Vol (A4C):   85.6 ml 45.00 ml/m LA Biplane Vol: 76.1 ml 40.01 ml/m  AORTIC VALVE LVOT Vmax:   117.00 cm/s LVOT Vmean:  80.200 cm/s LVOT VTI:    0.197 m  AORTA Ao Root diam: 3.10 cm Ao Asc diam:  3.30 cm  SHUNTS Systemic VTI:  0.20 m Systemic Diam: 2.10 cm Toribio Fuel MD  Electronically signed by Toribio Fuel MD Signature Date/Time: 08/23/2023/9:56:34 AM    Final    CT Cervical Spine Wo Contrast Result Date: 08/22/2023 EXAM: CT CERVICAL SPINE WITHOUT CONTRAST 08/22/2023 11:35:26 AM TECHNIQUE: CT of the cervical spine was performed without the administration of intravenous contrast. Multiplanar reformatted images are provided for review. Automated exposure control, iterative reconstruction, and/or weight based adjustment of the mA/kV was utilized to reduce the radiation dose to as low as reasonably achievable. COMPARISON: CT cervical spine dated 11/08/2022. CLINICAL HISTORY: Neck trauma (Age >= 65y). Triage notes; Pt to ED via [facility] from [facility] unit. Pt had unwittnessed fall, pt found laying on bathroom fall. Pt has lac to right side of head and a skin tear to left elbow, abrasion to right knee. Pt told nursing staff she felt dizzy, told EMS ; she was just going to get dressed but doesn't remember fall. Pt has bandage in place on head and c-collar in  place on arrival. Pt did c/o hip pain, was laying on left hip when EMS arrived. Pt does not take blood thinners. A\T\Ox2, baseline for pt.; ; 147/68; HR 64; 95%; Cbg 127 FINDINGS: CERVICAL SPINE: BONES AND ALIGNMENT: Cervical lordosis is maintained. No spondylolisthesis, no facet subluxation or dislocation. No compression fracture or displaced fracture in the cervical spine. Lumbarization of the first sacral vertebra. DEGENERATIVE CHANGES: Disc osteophyte complexes at multiple levels. There is severe disc space narrowing at C6-7. There is mild spinal canal stenosis at C6-7. Endplate arthrosis and osteophyte hypertrophy at multiple levels. There is significant foraminal stenosis most pronounced at C3-4. SOFT TISSUES: No prevertebral soft tissue swelling. LUNGS: Biapical pleural parenchymal scarring and bilateral pleural calcifications. IMPRESSION: 1. No acute abnormality of the cervical spine related to the  reported neck trauma. Electronically signed by: Donnice Mania MD 08/22/2023 11:55 AM EDT RP Workstation: [id number]   CT Head Wo Contrast Result Date: 08/22/2023 EXAM: CT HEAD WITHOUT CONTRAST 08/22/2023 11:35:26 AM TECHNIQUE: CT of the head was performed without the administration of intravenous contrast. Automated exposure control, iterative reconstruction, and/or weight based adjustment of the mA/kV was utilized to reduce the radiation dose to as low as reasonably achievable. COMPARISON: CT head 115 24 CLINICAL HISTORY: Head trauma, minor (Age >= 65y). Triage notes; Pt to ED via [facility] from [facility] unit. Pt had unwittnessed fall, pt found laying on bathroom fall. Pt has lac to right side of head and a skin tear to left elbow, abrasion to right knee. Pt told nursing staff she felt dizzy, told EMS ; she was just going to get dressed but doesn't remember fall. Pt has bandage in place on head and c-collar in place on arrival. Pt did c/o hip pain, was laying on left hip when EMS arrived. Pt does not take blood thinners. A\T\Ox2, baseline for pt.; ; 147/68; HR 64; 95%; Cbg 127 FINDINGS: BRAIN AND VENTRICLES: No acute hemorrhage. Gray-white differentiation is preserved. No hydrocephalus. No extra-axial collection. No mass effect or midline shift. Nonspecific hypoattenuation in the periventricular and subcortical white matter, most likely representing chronic small vessel disease. Generalized brain volume loss. ORBITS: Bilateral lens replacement. SINUSES: No acute abnormality. SOFT TISSUES AND SKULL: Focal soft tissue swelling and laceration involving the right frontal scalp and forehead. No skull fracture. VASCULATURE: Atherosclerosis of the carotid siphons and intracranial vertebral arteries. IMPRESSION: 1. No acute intracranial abnormality related to the reported head trauma. 2. Focal soft tissue swelling and laceration involving the right frontal scalp and forehead. Electronically signed by:  Donnice Mania MD 08/22/2023 11:49 AM EDT RP Workstation: [id number]   DG Pelvis 1-2 Views Result Date: 08/22/2023 CLINICAL DATA:  syncope EXAM: PELVIS - 1-2 VIEW COMPARISON:  January 17, 2022 FINDINGS: Similar alignment of the 3 cortical lag screws transfixing the right femoral neck. Diffuse osteopenia.No evidence of pelvic fracture or diastasis.No acute hip fracture or dislocation.Soft tissues are unremarkable. IMPRESSION: No acute fracture, pelvic bone diastasis, or dislocation. Electronically Signed   By: Rogelia Myers M.D.   On: 08/22/2023 11:48   DG Chest Port 1 View Result Date: 08/22/2023 CLINICAL DATA:  syncope EXAM: DG CHEST 1V PORT COMPARISON:  February 04, 2023 FINDINGS: Biapical pleural thickening with densely calcified pleural plaques. No focal airspace consolidation, pleural effusion, or pneumothorax. No cardiomegaly. Aortic atherosclerosis. No acute fracture or destructive lesions. Multilevel thoracic osteophytosis. IMPRESSION: No acute cardiopulmonary abnormality. Electronically Signed   By: Rogelia Myers M.D.   On: 08/22/2023 11:38    Cardiac Studies  Echo 08/23/23:   1. Left ventricular ejection fraction, by estimation, is 65 to 70%. The  left ventricle has normal function. The left ventricle has no regional  wall motion abnormalities. There is mild concentric left ventricular  hypertrophy. Left ventricular diastolic  parameters are indeterminate.   2. Right ventricular systolic function is normal. The right ventricular  size is mildly enlarged. Tricuspid regurgitation signal is inadequate for  assessing PA pressure.   3. Left atrial size was moderately dilated.   4. Right atrial size was moderately dilated.   5. The mitral valve is normal in structure. Trivial mitral valve  regurgitation.   6. The aortic valve is tricuspid. There is mild calcification of the  aortic valve. Aortic valve regurgitation is trivial. Aortic valve  sclerosis/calcification is present, without  any evidence of aortic  stenosis.   Patient Profile   Ms. Hearty is a 61F with recurrent syncope, hypertension, bradycardia, PVCs, CKD 3b, hyperlipidemia, and dementia admitted with recurrent syncope/fall.   Assessment & Plan   # Syncope/fall: # Atrial flutter:  Ms. Axelson fell down in her room while getting dressed in the morning.  Reportedly this has happened in the past.  Ambulatory monitors have not revealed any significant arrhythmias.  She does have a history of bradycardia and has been off of beta-blockers.  Monitor here and EKG revealed that she is in atrial flutter with variable rates.  There was concern that post-conversion pauses may be causing syncope.  She converted to sinus rhythm after one dose of amiodarone  100mg .  No prolonged pause noted.  After two doses, sinus bradycardia in the 50/60s.   Will reduce to 50mg  daily.  No anticoagulation given her recurrent falls and syncope. Urinalysis is pending.    # Hypertension:  # Orthostatic hypotension:  Will repeat orthostatic vital signs.  BP this AM is quite high.  Her home amlodipine  was started by the primary team. Recommend compression socks.   For questions or updates, please contact [facility] Please consult [email or web address] for contact info under     Signed, Annabella Scarce, MD  08/24/2023, 9:53 AM

## 2023-08-24 NOTE — Progress Notes (Signed)
 Mobility Specialist Progress Note:    08/24/23 1034  Mobility  Activity Ambulated with assistance  Level of Assistance Contact guard assist, steadying assist  Assistive Device Front wheel walker  Distance Ambulated (ft) 100 ft  Activity Response Tolerated well  Mobility Referral Yes  Mobility visit 1 Mobility  Mobility Specialist Start Time (ACUTE ONLY) 1034  Mobility Specialist Stop Time (ACUTE ONLY) 1045  Mobility Specialist Time Calculation (min) (ACUTE ONLY) 11 min   Pt agreeable to session. No c/o any symptoms. Pt seemed a little more off balance standing so asked her to use RW in which she agreed. Returned pt to bed w/ all needs met.   Venetia Keel Mobility Specialist Please Neurosurgeon or Rehab Office at [phone]

## 2023-08-24 NOTE — Evaluation (Signed)
 Occupational Therapy Evaluation Patient Details Name: Tracey Rosario MRN: [id number] DOB: [birth year] Today's Date: 08/24/2023   History of Present Illness   Pt is a 89 y.o. female who presented 08/22/23 s/p syncope. Imaging negative for acute injuries. PMH: anemia, arthropathy, bradycardia, CKD, cognitive decline, depression, GERD, HLD, HTN, hypertensive retinopathy, hypothyroidism, insomnia, macular degeneration, PSVT, short-term memory loss, dementia     Clinical Impressions Pt presented in bed and was able to report they were in the hospital due to a fall. Pt was able to report with cues they live at [facility] but unclear with pt if in memory care vs ALF. Pt did report on they have people assisting them and she used a walker. At this time limited activity due to bp and HR in session (nursing aware). However, she was able to step transfers with CGA and RW. At this time continue to follow with Acute Occupational Therapy. Recommendation for HH vs OP therapy in house if they are able to have constant supervision due to cognition.   BP on supine: RUE180/59 (94) LUE190/64 (99)  Sitting 178//56 (91)  Sitting: 154/107 (123) Post transfer to chair: 173/70 (94)     If plan is discharge home, recommend the following:   A little help with walking and/or transfers;A little help with bathing/dressing/bathroom;Assistance with cooking/housework;Direct supervision/assist for medications management;Direct supervision/assist for financial management;Assist for transportation;Help with stairs or ramp for entrance     Functional Status Assessment   Patient has had a recent decline in their functional status and demonstrates the ability to make significant improvements in function in a reasonable and predictable amount of time.     Equipment Recommendations   Other (comment) (TBD)     Recommendations for Other Services         Precautions/Restrictions    Precautions Precautions: Fall Recall of Precautions/Restrictions: Impaired Precaution/Restrictions Comments: watch HR/BP Restrictions Weight Bearing Restrictions Per Provider Order: No     Mobility Bed Mobility Overal bed mobility: Needs Assistance Bed Mobility: Supine to Sit     Supine to sit: Contact guard          Transfers Overall transfer level: Needs assistance Equipment used: Rolling walker (2 wheels) Transfers: Sit to/from Stand Sit to Stand: Contact guard assist                  Balance Overall balance assessment: Needs assistance Sitting-balance support: Feet supported Sitting balance-Leahy Scale: Good     Standing balance support: Bilateral upper extremity supported Standing balance-Leahy Scale: Fair                             ADL either performed or assessed with clinical judgement   ADL Overall ADL's : Needs assistance/impaired Eating/Feeding: Set up;Sitting   Grooming: Set up;Sitting   Upper Body Bathing: Set up;Sitting   Lower Body Bathing: Moderate assistance;Bed level;Sit to/from stand   Upper Body Dressing : Set up;Sitting   Lower Body Dressing: Moderate assistance;Sit to/from stand   Toilet Transfer: Dealer Details (indicate cue type and reason): simulated with chair         Functional mobility during ADLs: Contact guard assist;Rolling walker (2 wheels)       Vision   Additional Comments: Pt noted to have difficulties reading number of date. Further assess     Perception         Praxis         Pertinent Vitals/Pain  Pain Assessment Pain Assessment: No/denies pain Breathing: normal Negative Vocalization: none Facial Expression: smiling or inexpressive Body Language: relaxed Consolability: no need to console PAINAD Score: 0     Extremity/Trunk Assessment Upper Extremity Assessment Upper Extremity Assessment: Generalized weakness   Lower Extremity  Assessment Lower Extremity Assessment: Defer to PT evaluation   Cervical / Trunk Assessment Cervical / Trunk Assessment: Kyphotic   Communication Communication Communication: No apparent difficulties   Cognition Arousal: Alert Behavior During Therapy: WFL for tasks assessed/performed Cognition: No family/caregiver present to determine baseline, History of cognitive impairments             OT - Cognition Comments: Pt was able to report that she is in the hospital and she had a fall. Pt was able to locate calendar on the wall and report  date.                 Following commands: Impaired Following commands impaired: Follows multi-step commands with increased time     Cueing  General Comments   Cueing Techniques: Verbal cues      Exercises     Shoulder Instructions      Home Living Family/patient expects to be discharged to:: Assisted living (vs memory care?)                             Home Equipment: Rollator (4 wheels)          Prior Functioning/Environment Prior Level of Function : Needs assist;History of Falls (last six months);Patient poor historian/Family not available             Mobility Comments: Pt reports intermittent use of rollator but often no AD; hx of falls ADLs Comments: Likely needed assistance for cognitive tasks, like managing meds, due to hx if dementia.    OT Problem List: Decreased strength;Decreased activity tolerance;Impaired balance (sitting and/or standing);Decreased knowledge of use of DME or AE;Cardiopulmonary status limiting activity   OT Treatment/Interventions: Self-care/ADL training;Therapeutic exercise;Balance training;Patient/family education      OT Goals(Current goals can be found in the care plan section)   Acute Rehab OT Goals Patient Stated Goal: none OT Goal Formulation: With patient Time For Goal Achievement: 09/07/23 Potential to Achieve Goals: Good   OT Frequency:  Min 2X/week     Co-evaluation              AM-PAC OT 6 Clicks Daily Activity     Outcome Measure Help from another person eating meals?: A Little Help from another person taking care of personal grooming?: A Little Help from another person toileting, which includes using toliet, bedpan, or urinal?: A Little Help from another person bathing (including washing, rinsing, drying)?: A Lot Help from another person to put on and taking off regular upper body clothing?: A Little Help from another person to put on and taking off regular lower body clothing?: A Lot 6 Click Score: 16   End of Session Equipment Utilized During Treatment: Gait belt;Rolling walker (2 wheels) Nurse Communication: Mobility status  Activity Tolerance: Other (comment) (bp HR) Patient left: in chair;with call bell/phone within reach;with chair alarm set  OT Visit Diagnosis: Unsteadiness on feet (R26.81);Other abnormalities of gait and mobility (R26.89);Repeated falls (R29.6);Muscle weakness (generalized) (M62.81);History of falling (Z91.81)                Time: 9242-9168 OT Time Calculation (min): 34 min Charges:  OT General Charges $OT Visit: 1 Visit OT Evaluation $OT  Eval Low Complexity: 1 Low OT Treatments $Self Care/Home Management : 8-22 mins  Warrick POUR OTR/L  Acute Rehab Services  [phone] office number   Warrick Berber 08/24/2023, 8:44 AM

## 2023-08-24 NOTE — Progress Notes (Signed)
 HD#1 SUBJECTIVE:  Tracey Rosario Summary: Tracey Rosario is a 89 y.o. with a pertinent PMH of CKD, HLD, syncope, dementia, and HTN who presented to the ED after a fall that led to head laceration and admitted for syncope.   Overnight Events: None   Interim History: Tracey Rosario denies dizziness, lightheadedness, or syncope this morning. There was also no chest pain, shortness of breath or palpitations. There has been no problems with oral intake. Tracey Rosario reported no urinary or bowel dysfunctions.   OBJECTIVE:  Vital Signs: Vitals:   08/23/23 1946 08/24/23 0026 08/24/23 0518 08/24/23 0555  BP: (!) 154/56 (!) 154/69 (!) 183/70 (!) 180/63  Pulse: 61 (!) 55 (!) 59   Resp: 18 18 18    Temp: 97.9 F (36.6 C) 97.7 F (36.5 C) 98.4 F (36.9 C)   TempSrc: Oral Oral Oral   SpO2: 92% 97% 94%   Weight:      Height:       Supplemental O2: Room Air SpO2: 94 %  Filed Weights   08/22/23 1008 08/23/23 0005 08/23/23 0600  Weight: 71 kg 72.1 kg 71.2 kg     Intake/Output Summary (Last 24 hours) at 08/24/2023 0710 Last data filed at 08/24/2023 9482 Gross per 24 hour  Intake 120 ml  Output 750 ml  Net -630 ml   Net IO Since Admission: -390 mL [08/24/23 0710]  Physical Exam: Physical Exam Constitutional:      General: Tracey Rosario is not in acute distress.    Appearance: Tracey Rosario is not ill-appearing.  Cardiovascular:     Rate and Rhythm: Normal rate. Rhythm irregular.     Pulses: Normal pulses.     Heart sounds: Normal heart sounds.  Pulmonary:     Effort: Pulmonary effort is normal.     Breath sounds: Normal breath sounds.  Skin:    General: Skin is warm and dry.  Neurological:     Mental Status: Tracey Rosario is oriented to person, place, and time.  Psychiatric:        Mood and Affect: Mood normal.        Behavior: Behavior normal.     Tracey Rosario Lines/Drains/Airways Status     Active Line/Drains/Airways     Name Placement date Placement time Site Days   Peripheral IV 08/22/23 22 G Anterior;Right  Forearm 08/22/23  1409  Forearm  2   Wound 08/22/23 1800 Face Right;Upper 08/22/23  1800  Face  2            Pertinent labs and imaging:      Latest Ref Rng & Units 08/24/2023    3:00 AM 08/23/2023    3:17 AM 08/22/2023   11:24 AM  CBC  WBC 4.0 - 10.5 K/uL 6.6  7.5  8.2   Hemoglobin 12.0 - 15.0 g/dL 88.2  86.9  85.6   Hematocrit 36.0 - 46.0 % 35.6  38.6  43.5   Platelets 150 - 400 K/uL 222  257  251        Latest Ref Rng & Units 08/24/2023    3:00 AM 08/23/2023    3:17 AM 08/22/2023   11:24 AM  CMP  Glucose 70 - 99 mg/dL 898  892  897   BUN 8 - 23 mg/dL 26  17  16    Creatinine 0.44 - 1.00 mg/dL 8.16  8.67  8.70   Sodium 135 - 145 mmol/L 139  139  140   Potassium 3.5 - 5.1 mmol/L 4.7  4.3  4.8  Chloride 98 - 111 mmol/L 102  105  103   CO2 22 - 32 mmol/L 25  24  24    Calcium 8.9 - 10.3 mg/dL 9.2  9.2  89.9     ECHOCARDIOGRAM COMPLETE Result Date: 08/23/2023    ECHOCARDIOGRAM REPORT   Tracey Rosario Name:   Tracey Rosario Date of Exam: 08/23/2023 Medical Rec #:  [id number]                 Height:       71.0 in Accession #:    [id number]                Weight:       157.0 lb Date of Birth:  1926-07-16                 BSA:          1.902 m Tracey Rosario Age:    89 years                  BP:           136/46 mmHg Tracey Rosario Gender: F                         HR:           79 bpm. Exam Location:  Inpatient Procedure: 2D Echo, Cardiac Doppler, Color Doppler and Intracardiac            Opacification Agent (Both Spectral and Color Flow Doppler were            utilized during procedure). Indications:    I48.91* Unspecified atrial fibrillation  History:        Tracey Rosario has prior history of Echocardiogram examinations, most                 recent 01/06/2022. CKD; Risk Factors:Hypertension and                 Dyslipidemia.  Sonographer:    Damien Senior RDCS Referring Phys: LONNI [place] IMPRESSIONS  1. Left ventricular ejection fraction, by estimation, is 65 to 70%. The left ventricle has normal  function. The left ventricle has no regional wall motion abnormalities. There is mild concentric left ventricular hypertrophy. Left ventricular diastolic parameters are indeterminate.  2. Right ventricular systolic function is normal. The right ventricular size is mildly enlarged. Tricuspid regurgitation signal is inadequate for assessing PA pressure.  3. Left atrial size was moderately dilated.  4. Right atrial size was moderately dilated.  5. The mitral valve is normal in structure. Trivial mitral valve regurgitation.  6. The aortic valve is tricuspid. There is mild calcification of the aortic valve. Aortic valve regurgitation is trivial. Aortic valve sclerosis/calcification is present, without any evidence of aortic stenosis. FINDINGS  Left Ventricle: Left ventricular ejection fraction, by estimation, is 65 to 70%. The left ventricle has normal function. The left ventricle has no regional wall motion abnormalities. Definity  contrast agent was given IV to delineate the left ventricular  endocardial borders. The left ventricular internal cavity size was normal in size. There is mild concentric left ventricular hypertrophy. Left ventricular diastolic function could not be evaluated due to atrial fibrillation. Left ventricular diastolic parameters are indeterminate. Right Ventricle: The right ventricular size is mildly enlarged. No increase in right ventricular wall thickness. Right ventricular systolic function is normal. Tricuspid regurgitation signal is inadequate for assessing PA pressure. Left Atrium: Left atrial size was moderately dilated. Right Atrium: Right  atrial size was moderately dilated. Pericardium: There is no evidence of pericardial effusion. Presence of epicardial fat layer. Mitral Valve: The mitral valve is normal in structure. Trivial mitral valve regurgitation. Tricuspid Valve: The tricuspid valve is normal in structure. Tricuspid valve regurgitation is trivial. Aortic Valve: The aortic valve is  tricuspid. There is mild calcification of the aortic valve. Aortic valve regurgitation is trivial. Aortic valve sclerosis/calcification is present, without any evidence of aortic stenosis. Pulmonic Valve: The pulmonic valve was grossly normal. Pulmonic valve regurgitation is trivial. Aorta: The aortic root and ascending aorta are structurally normal, with no evidence of dilitation. Venous: The inferior vena cava was not well visualized. IAS/Shunts: No atrial level shunt detected by color flow Doppler.  LEFT VENTRICLE PLAX 2D LVIDd:         2.50 cm LVIDs:         1.85 cm LV PW:         1.20 cm LV IVS:        1.20 cm LVOT diam:     2.10 cm LV SV:         68 LV SV Index:   36 LVOT Area:     3.46 cm  RIGHT VENTRICLE RV S prime:     11.70 cm/s TAPSE (M-mode): 1.6 cm LEFT ATRIUM             Index        RIGHT ATRIUM           Index LA diam:        3.30 cm 1.73 cm/m   RA Area:     23.70 cm LA Vol (A2C):   63.1 ml 33.17 ml/m  RA Volume:   77.70 ml  40.85 ml/m LA Vol (A4C):   85.6 ml 45.00 ml/m LA Biplane Vol: 76.1 ml 40.01 ml/m  AORTIC VALVE LVOT Vmax:   117.00 cm/s LVOT Vmean:  80.200 cm/s LVOT VTI:    0.197 m  AORTA Ao Root diam: 3.10 cm Ao Asc diam:  3.30 cm  SHUNTS Systemic VTI:  0.20 m Systemic Diam: 2.10 cm Toribio Fuel MD Electronically signed by Toribio Fuel MD Signature Date/Time: 08/23/2023/9:56:34 AM    Final     ASSESSMENT/PLAN:  Assessment: Principal Problem:   Syncope and collapse Active Problems:   Typical atrial flutter (HCC)   Tachycardia-bradycardia syndrome (HCC)   Head injury   Syncope   Plan: Recurrent Syncope/Fall Atrial Flutter Tachycardia-bradycardia syndrome  Tracey Rosario presented after a fall at her memory care facility. Cardiac monitoring revealed atrial flutter with fluctuations of heart rates, without arrhythmias. Per cardiology, amiodarone  100 mg twice daily was started yesterday to stabilize heart rate and conversion to sinus rhythm was seen after one dose.  Overnight, HR has been in the high 50s and sinus bradycardia was seen on telemetry. Tracey Rosario has been asymptomatic. Echocardiogram 08/23/2023 showed EF of 65-70% and mild aortic calcifications.   Cardiology's recommendations:  - Reduce amiodarone  to 50 mg once daily given bradycardia  - Anticoagulation deferred due to history of syncope and recurrent falls   - Thank you for the consult and we greatly appreciate the assistance.   AKI Cr 1.32 -> 1.83. BUN 17 -> 26. Prerenal causes are unlikely due to Tracey Rosario's well solid and liquid intake, along with euvolemic presentation on exam. Medication-induced toxicity is also unlikely as the association between AKI and amiodarone  is seen more in high IV doses and amiodarone -associated thyroid  dysfunction. TSH is stable and there has been no concern  for thyroid  symptoms. Tracey Rosario has been afebrile and without leukocytosis; urinalysis is pending. Although Tracey Rosario reports no urinary dysfunction and no voiding problems, we will order bladder scan to check for obstructive causes.   - BMP in the am   - Encourage adequate hydration  - UA pending   - Bladder scan pending    Hypertension  Orthostatic Hypotension  BP overnight and this morning have been 170s-180s systolics. Orthostatic vitals were positive. Tracey Rosario was on amlodipine  5 mg once a day and lisinopril  5 mg once a day before admission.  - Repeat orthostatic vital signs today   - Restart amlodipine  5 mg once daily   - Hold lisinopril  5 mg due to current AKI   - Exercise precautions with abrupt positional changes, especially standing up            - Encourage adequate hydration and compression socks  Dementia Pt has dementia at baseline and was on donepezil  23 mg once a day and memantine   5mg  once daily. We will hold these medications as donepezil  could contribute to bradycardia.  Hypothyroidism  TSH is within normal range and there has been no acute concern.  - Continue levothyroxine  75 mcg daily before breakfast    Anxiety/Depression   - Continue mirtazapine  7.5 mg daily at bedtime  Anemia of Chronic Disease  Hgb 11.7. There has been no acute concern.  - Monitor with CBC   - Continue ferrous sulfate  tablet 325 mg once daily   Best Practice: Diet: Regular diet VTE: enoxaparin  (LOVENOX ) injection 30 mg Start: 08/22/23 1700 Code: DNR  Disposition planning: Family Contact: Daughter, to be notified. DISPO: Anticipated discharge in 2 days to Memory care unit pending Bradycardia and AKI.  Signature:  Marke Goodwyn T Leontine Maris [facility]  7:10 AM, 08/24/2023  On Call pager [phone]

## 2023-08-24 NOTE — Progress Notes (Signed)
 Dr. Myrna was made aware of BP=174/49. HR 53. Pt is asymptomatic. Dr. Myrna said to continue to monitor and inform the day staff.

## 2023-08-25 ENCOUNTER — Other Ambulatory Visit (HOSPITAL_COMMUNITY): Payer: Self-pay

## 2023-08-25 ENCOUNTER — Other Ambulatory Visit: Payer: Self-pay | Admitting: Cardiology

## 2023-08-25 ENCOUNTER — Inpatient Hospital Stay (HOSPITAL_COMMUNITY)
Admit: 2023-08-25 | Discharge: 2023-08-25 | Disposition: A | Discharge status: Home / Self Care | Attending: Cardiovascular Disease | Admitting: Cardiovascular Disease

## 2023-08-25 DIAGNOSIS — R001 Bradycardia, unspecified: Secondary | ICD-10-CM

## 2023-08-25 DIAGNOSIS — E039 Hypothyroidism, unspecified: Secondary | ICD-10-CM

## 2023-08-25 DIAGNOSIS — I483 Typical atrial flutter: Secondary | ICD-10-CM

## 2023-08-25 DIAGNOSIS — Z79890 Hormone replacement therapy (postmenopausal): Secondary | ICD-10-CM

## 2023-08-25 DIAGNOSIS — R55 Syncope and collapse: Secondary | ICD-10-CM

## 2023-08-25 DIAGNOSIS — F419 Anxiety disorder, unspecified: Secondary | ICD-10-CM

## 2023-08-25 DIAGNOSIS — F0393 Unspecified dementia, unspecified severity, with mood disturbance: Secondary | ICD-10-CM

## 2023-08-25 LAB — CBC
HCT: 32.8 % — ABNORMAL LOW (ref 36.0–46.0)
Hemoglobin: 10.9 g/dL — ABNORMAL LOW (ref 12.0–15.0)
MCH: 29.3 pg (ref 26.0–34.0)
MCHC: 33.2 g/dL (ref 30.0–36.0)
MCV: 88.2 fL (ref 80.0–100.0)
Platelets: 222 K/uL (ref 150–400)
RBC: 3.72 MIL/uL — ABNORMAL LOW (ref 3.87–5.11)
RDW: 13.4 % (ref 11.5–15.5)
WBC: 5.2 K/uL (ref 4.0–10.5)
nRBC: 0 % (ref 0.0–0.2)

## 2023-08-25 LAB — URINALYSIS, ROUTINE W REFLEX MICROSCOPIC
Bilirubin Urine: NEGATIVE
Glucose, UA: NEGATIVE mg/dL
Ketones, ur: NEGATIVE mg/dL
Nitrite: NEGATIVE
Protein, ur: 30 mg/dL — AB
Specific Gravity, Urine: 1.009 (ref 1.005–1.030)
WBC, UA: >50 WBC/hpf (ref 0–5)
pH: 6 (ref 5.0–8.0)

## 2023-08-25 LAB — BASIC METABOLIC PANEL WITH GFR
Anion gap: 17 — ABNORMAL HIGH (ref 5–15)
BUN: 21 mg/dL (ref 8–23)
CO2: 22 mmol/L (ref 22–32)
Calcium: 9.3 mg/dL (ref 8.9–10.3)
Chloride: 107 mmol/L (ref 98–111)
Creatinine, Ser: 1.39 mg/dL — ABNORMAL HIGH (ref 0.44–1.00)
GFR, Estimated: 35 mL/min — ABNORMAL LOW (ref >60)
Glucose, Bld: 91 mg/dL (ref 70–99)
Potassium: 4.3 mmol/L (ref 3.5–5.1)
Sodium: 146 mmol/L — ABNORMAL HIGH (ref 135–145)

## 2023-08-25 MED ORDER — PANTOPRAZOLE SODIUM 40 MG PO TBEC
40.0000 mg | DELAYED_RELEASE_TABLET | Freq: Every day | ORAL | 0 refills | Status: AC
  Filled 2023-08-25: qty 30, 30d supply, fill #0

## 2023-08-25 NOTE — Discharge Summary (Signed)
 Name: Tracey Rosario MRN: [id number] DOB: 1926-05-14 89 y.o. PCP: Pcp, No  Date of Admission: 08/22/2023 10:02 AM Date of Discharge: 08/25/2023 Attending Physician: Dr. MICAEL Riis Winfrey  Discharge Diagnosis: 1. Principal Problem:   Syncope and collapse Active Problems:   Atrial fibrillation (HCC)   Typical atrial flutter (HCC)   Tachycardia-bradycardia syndrome (HCC)   Head injury   Syncope   Discharge Medications: Allergies as of 08/25/2023       Reactions   Codeine Other (See Comments)   Altered mental status        Medication List     PAUSE taking these medications    oxybutynin  10 MG 24 hr tablet Wait to take this until your doctor or other care provider tells you to start again. Commonly known as: DITROPAN -XL Take 10 mg by mouth daily.       STOP taking these medications    omeprazole  20 MG capsule Commonly known as: PRILOSEC Replaced by: pantoprazole  40 MG tablet   ondansetron  4 MG tablet Commonly known as: ZOFRAN        TAKE these medications    albuterol  108 (90 Base) MCG/ACT inhaler Commonly known as: VENTOLIN  HFA Inhale 2 puffs into the lungs every 6 (six) hours as needed for wheezing or shortness of breath.   amLODipine  5 MG tablet Commonly known as: NORVASC  Take 5 mg by mouth daily.   Baza Protect Moisture Barrier 12 % Crea Generic drug: Zinc Oxide Apply 1 application  topically daily.   citalopram  20 MG tablet Commonly known as: CELEXA  Take 20 mg by mouth daily.   Culturelle Caps Take 1 capsule by mouth daily.   donepezil  23 MG Tabs tablet Commonly known as: ARICEPT  Take 23 mg by mouth at bedtime.   FeroSul 325 (65 FE) MG tablet Generic drug: ferrous sulfate  Take 325 mg by mouth daily.   furosemide  20 MG tablet Commonly known as: LASIX  Take 10 mg by mouth daily.   gabapentin  300 MG capsule Commonly known as: NEURONTIN  Take 300 mg by mouth 3 (three) times daily as needed (for neuropathic pain).    GENTEAL TEARS OP Place 1 drop into both eyes 3 (three) times daily as needed (dry eyes).   Gerhardt's butt cream Crea Apply 1 Application topically daily.   levothyroxine  75 MCG tablet Commonly known as: SYNTHROID  Take 75 mcg by mouth daily before breakfast.   lisinopril  5 MG tablet Commonly known as: ZESTRIL  Take 5 mg by mouth daily.   loperamide 2 MG tablet Commonly known as: IMODIUM A-D Take 2 mg by mouth every 6 (six) hours as needed for diarrhea or loose stools.   memantine  5 MG tablet Commonly known as: NAMENDA  TAKE 1 TABLET BY MOUTH EVERYDAY AT BEDTIME   mirtazapine  7.5 MG tablet Commonly known as: REMERON  Take 7.5 mg by mouth at bedtime.   pantoprazole  40 MG tablet Commonly known as: PROTONIX  Take 1 tablet (40 mg total) by mouth daily. Start taking on: August 26, 2023 Replaces: omeprazole  20 MG capsule   SYSTANE ULTRA OP Place 1 drop into both eyes 3 (three) times daily.   Vitamin D3 125 MCG (5000 UT) Caps Take 1 capsule by mouth daily.               Discharge Care Instructions  (From admission, onward)           Start     Ordered   08/25/23 0000  Change dressing (specify)       Comments: Dressing  change: 1 time per day using gauze. Please remove sutures in 7 days and ensure proper healing.   08/25/23 1151            Disposition and follow-up:   TraceyHeide Cliborne Rosario was discharged from [facility] in Good condition.  At the hospital follow up visit please address:  Please ensure pt received zio patch  Please check pt's heart rate Please check orthostatics Repeat BMP, titrate HTN medications accordingly. Stopped a few of them here as she had an AKI. Only on amlodipine  for HTN    2.  Labs / imaging needed at time of follow-up: BMP  3.  Pending labs/ test needing follow-up: NA  Follow-up Appointments:    Hospital Course by problem list: Tracey Rosario is a 89 y.o. person living with a history  of CKD, HLD who presented with a syncopal episode and admitted for syncope  now being discharged on hospital day 2 with the following pertinent hospital course:  Recurrent Syncope/Fall due to Conduction Abnormality Atrial Flutter  Tachycardia-bradycardia syndrome  Patient presented after a fall at her memory care facility. There was no prodromal symptoms and she could not recall the exact events surrounding the fall. She had 2 similar episodes 2 months and 6 months ago. Echocardiogram 08/25/2023 showed EF of 65 to 70%, with no valvular abnormalities. EKG revealed atrial flutter with intermittent high AV blocks and pauses, with variable heart rates. Cardiology was consulted and she was started on oral amiodarone  100 mg BID with subsequent sinus conversion. Amiodarone  dose was then reduced to 50 mg once daily due to bradycardia, and without improvement.  On day of discharge, there were no dizziness, lightheadedness or recurrence of symptoms. Amiodarone  was discontinued, and she was put on 14-day heart monitor with instructions to check BP and HR twice daily, to be follow up with EP.   AKI  There is documented history of CKD stage III and baseline Cr is around 0.97. Bladder scan showed no retention and FENa was found to be 0.4%, indicating prerenal cause. This was likely due to decreased volume as her 2 previous ED visits of syncope showed similar pattern of elevated Cr. Patient was eating and drinking well. On day of discharge, Cr was improving towards baseline and there was no signs of dysuria, hematuria, urinary frequency or urgency.   Hypertension  Orthostatic Hypotension  Orthostatic vitals were positive without symptoms. Home lisinopril -hydrochlorothiazide  was held due to the AKI and amlodipine  was continued for hypertension. Will resume home meds and follow up outpatient.   Dementia  Patient has dementia at baseline and was donepezil  23 mg once a day and memantine   5mg  once daily. There was no  acute concern. Follow up outpatient.   Hypothyroidism  TSH stable. Continue Levothyroxine  75 mcg. Follow up outpatient.  Anxiety/Depression Continue mirtazapine  7.5 mg daily. Follow up outpatient.  Anemia of Chronic Disease  Hgb stable. Continue ferrous sulfate  325 mg once daily. Follow up outpatient.    Subjective Pt seen bedside this AM. States she is feeling well, with no acute complaints.  Discharge Exam:   BP (!) 165/52 (BP Location: Right Arm)   Pulse (!) 58   Temp 97.6 F (36.4 C) (Oral)   Resp 19   Ht 5' 11 (1.803 m)   Wt 71.6 kg   SpO2 100%   BMI 22.02 kg/m  Discharge exam:   Physical Exam Constitutional:      General: She is not in acute distress.  Appearance: She is not ill-appearing.  Cardiovascular:     Rate and Rhythm: Normal rate. Rhythm irregular.     Pulses: Normal pulses.     Heart sounds: Normal heart sounds.  Pulmonary:     Effort: Pulmonary effort is normal.     Breath sounds: Normal breath sounds.  Skin:    General: Skin is warm and dry.  Neurological:     Mental Status: She is oriented to person, place, and time.  Psychiatric:        Mood and Affect: Mood normal.        Behavior: Behavior normal.     Pertinent Labs, Studies, and Procedures:     Latest Ref Rng & Units 08/25/2023    2:40 AM 08/24/2023    3:00 AM 08/23/2023    3:17 AM  CBC  WBC 4.0 - 10.5 K/uL 5.2  6.6  7.5   Hemoglobin 12.0 - 15.0 g/dL 89.0  88.2  86.9   Hematocrit 36.0 - 46.0 % 32.8  35.6  38.6   Platelets 150 - 400 K/uL 222  222  257        Latest Ref Rng & Units 08/25/2023    2:40 AM 08/24/2023    3:43 PM 08/24/2023    3:00 AM  CMP  Glucose 70 - 99 mg/dL 91  889  898   BUN 8 - 23 mg/dL 21  22  26    Creatinine 0.44 - 1.00 mg/dL 8.60  8.46  8.16   Sodium 135 - 145 mmol/L 146  139  139   Potassium 3.5 - 5.1 mmol/L 4.3  4.4  4.7   Chloride 98 - 111 mmol/L 107  103  102   CO2 22 - 32 mmol/L 22  24  25    Calcium 8.9 - 10.3 mg/dL 9.3  9.5  9.2     No results  found.   Discharge Instructions: Discharge Instructions     Amb referral to [facility]   Complete by: As directed    Change dressing (specify)   Complete by: As directed    Dressing change: 1 time per day using gauze. Please remove sutures in 7 days and ensure proper healing.   Diet - low sodium heart healthy   Complete by: As directed    Increase activity slowly   Complete by: As directed        Signed: Riel Hirschman, MD 08/25/2023, 2:44 PM

## 2023-08-25 NOTE — Discharge Instructions (Addendum)
 Tracey Rosario,  You were recently admitted to [facility] for syncope and collapse.  When you fell and hit your head, we were making sure that your heart beat and function were normal.  Sometimes, low heart rate can cause us  to feel dizzy and make us  pass out.  Continue taking your home medications with the following changes  Start taking: Pantoprazole  (Protonix ) 40 mg tablet, daily.  This will replace your previous Prilosec OTC. Stop taking: Omeprazole  (Prilosec) 20 mg capsule daily.  We will replace this with Protonix  40 mg daily. Zofran  4 mg tablets every 6 hours as needed for nausea.  This medication has not been filled in some time.  Please consult with your doctor if you feel you need to resume this again. Pause taking: Oxybutynin  10 mg 24-hour tablet.  Please follow-up with your doctor about resuming this medication Continue taking: Albuterol  inhaler Amlodipine  5 mg tablet Citalopram  (Celexa ) 10 mg tablet, daily. Lactobacillus capsules Zinc oxide 12% cream Donepezil  (Aricept ) 23 mg tablet, once daily at bedtime. Ferrous sulfate  325 mg tablet, daily. Furosemide  (Lasix ) 20 mg tablet, daily Gabapentin  300 mg capsule.  Take 1 capsule by mouth 3 times daily as needed for neuropathic pain. Artificial tears Nystatin cream Levothyroxine   (Synthroid ) 70 mcg tablet, every morning Lisinopril  5 mg tablet daily Loperamide 2 mg tablet, every 6 hours as needed for diarrhea or loose stools Memantine  (Namenda ) 5 mg tablet, daily at bedtime. Mirtazapine  (Remeron ) 7.5 mg tablet, daily at bedtime Vitamin D3 supplements, 125 mcg.  1 capsule daily. Polyethyl glycol-propyl eyedrops, 1 drop in each eye 3 times daily   You should seek further medical care if you continue to feel persistently dizzy, feel like you are blood pressure or heart rate are low, or feel like you are going to pass out.  We recommend that you follow-up with the [facility] doctor in about a week to  make sure that you continue to improve.  You have a scheduled appointment with the cardiologist, Dr. Cindie on 10/11/2023 2 PM to follow-up about your heart.  Please ensure that your facility checks your blood pressure and your heart rate twice a day.   We are so glad that you are feeling better.  Sincerely, An Nguyen, MS4

## 2023-08-25 NOTE — Progress Notes (Signed)
 Rounding Note   Patient Name: Tracey Rosario Date of Encounter: 08/25/2023  [facility] Cardiologist: Vinie JAYSON Maxcy, MD   Subjective Feeling well.  Eager to go home.  Denies lightheadedness or dizziness.   Scheduled Meds:  amLODipine   5 mg Oral Daily   artificial tears  1 drop Both Eyes TID   citalopram   20 mg Oral Daily   enoxaparin  (LOVENOX ) injection  30 mg Subcutaneous Q24H   ferrous sulfate   325 mg Oral Daily   levothyroxine   75 mcg Oral QAC breakfast   mirtazapine   7.5 mg Oral QHS   pantoprazole   40 mg Oral Daily   Continuous Infusions:   PRN Meds: acetaminophen  **OR** acetaminophen , polyethylene glycol   Vital Signs  Vitals:   08/25/23 0527 08/25/23 0535 08/25/23 0623 08/25/23 0750  BP:  (!) 191/49 (!) 155/44 (!) 165/52  Pulse:    (!) 58  Resp:    19  Temp:    97.6 F (36.4 C)  TempSrc:    Oral  SpO2:    100%  Weight: 71.6 kg     Height:        Intake/Output Summary (Last 24 hours) at 08/25/2023 1004 Last data filed at 08/25/2023 0900 Gross per 24 hour  Intake 270 ml  Output 800 ml  Net -530 ml      08/25/2023    5:27 AM 08/23/2023    6:00 AM 08/23/2023   12:05 AM  Last 3 Weights  Weight (lbs) 157 lb 13.6 oz 156 lb 15.5 oz 158 lb 15.2 oz  Weight (kg) 71.6 kg 71.2 kg 72.1 kg      Telemetry Sinus bradycardia.  Sinus arrhythmia.- Personally Reviewed  ECG  N/a - Personally Reviewed  Physical Exam  VS:  BP (!) 165/52 (BP Location: Right Arm)   Pulse (!) 58   Temp 97.6 F (36.4 C) (Oral)   Resp 19   Ht 5' 11 (1.803 m)   Wt 71.6 kg   SpO2 100%   BMI 22.02 kg/m  , BMI Body mass index is 22.02 kg/m. GENERAL:  Well appearing HEENT: Pupils equal round and reactive, fundi not visualized, oral mucosa unremarkable NECK:  No jugular venous distention, waveform within normal limits, carotid upstroke brisk and symmetric, no bruits, no thyromegaly LUNGS:  Clear to auscultation bilaterally HEART:  Bradycardic  PMI not displaced  or sustained,S1 and S2 within normal limits, no S3, no S4, no clicks, no rubs, no murmurs ABD:  Flat, positive bowel sounds normal in frequency in pitch, no bruits, no rebound, no guarding, no midline pulsatile mass, no hepatomegaly, no splenomegaly EXT:  2 plus pulses throughout, no edema, no cyanosis no clubbing SKIN:  No rashes no nodules NEURO:  Cranial nerves II through XII grossly intact, motor grossly intact throughout PSYCH:  Cognitively intact, oriented to person place and time   Labs High Sensitivity Troponin:   Recent Labs  Lab 08/22/23 1124 08/22/23 1359  TROPONINIHS 9 12     Chemistry Recent Labs  Lab 08/24/23 0300 08/24/23 1543 08/25/23 0240  NA 139 139 146*  K 4.7 4.4 4.3  CL 102 103 107  CO2 25 24 22   GLUCOSE 101* 110* 91  BUN 26* 22 21  CREATININE 1.83* 1.53* 1.39*  CALCIUM 9.2 9.5 9.3  GFRNONAA 25* 31* 35*  ANIONGAP 12 12 17*    Lipids No results for input(s): CHOL, TRIG, HDL, LABVLDL, LDLCALC, CHOLHDL in the last 168 hours.  Hematology Recent Labs  Lab  08/23/23 0317 08/24/23 0300 08/25/23 0240  WBC 7.5 6.6 5.2  RBC 4.45 4.04 3.72*  HGB 13.0 11.7* 10.9*  HCT 38.6 35.6* 32.8*  MCV 86.7 88.1 88.2  MCH 29.2 29.0 29.3  MCHC 33.7 32.9 33.2  RDW 13.4 13.8 13.4  PLT 257 222 222   Thyroid   Recent Labs  Lab 08/22/23 1359  TSH 3.153    BNPNo results for input(s): BNP, PROBNP in the last 168 hours.  DDimer No results for input(s): DDIMER in the last 168 hours.   Radiology  No results found.   Cardiac Studies  Echo 08/23/23:   1. Left ventricular ejection fraction, by estimation, is 65 to 70%. The  left ventricle has normal function. The left ventricle has no regional  wall motion abnormalities. There is mild concentric left ventricular  hypertrophy. Left ventricular diastolic  parameters are indeterminate.   2. Right ventricular systolic function is normal. The right ventricular  size is mildly enlarged. Tricuspid  regurgitation signal is inadequate for  assessing PA pressure.   3. Left atrial size was moderately dilated.   4. Right atrial size was moderately dilated.   5. The mitral valve is normal in structure. Trivial mitral valve  regurgitation.   6. The aortic valve is tricuspid. There is mild calcification of the  aortic valve. Aortic valve regurgitation is trivial. Aortic valve  sclerosis/calcification is present, without any evidence of aortic  stenosis.   Patient Profile   Ms. Ventress is a 73F with recurrent syncope, hypertension, bradycardia, PVCs, CKD 3b, hyperlipidemia, and dementia admitted with recurrent syncope/fall.   Assessment & Plan   # Syncope/fall: # Atrial flutter:  Ms. Heyden fell down in her room while getting dressed in the morning.  Reportedly this has happened in the past.  Ambulatory monitors have not revealed any significant arrhythmias.  She does have a history of bradycardia and has been off of beta-blockers.  Monitor here and EKG revealed that she is in atrial flutter with variable rates.  She converted to sinus bradycardia after one dose of oral amiodarone  100mg .  She has not tolerated this long term due to bradycardia.  She has no exertional symptoms.  Denies lightheadedness or dizziness.  WIll plan for 14 day monitor and EP follow up.  No anticoagulation given her recurrent falls and syncope. Urinalysis is pending to rule out UTI.   # Hypertension:  # Orthostatic hypotension:  BP mostly elevated.  She is orthostatic with BP dropping from the 150s to the 130s but she wasn't symptomatic.  Continue to monitor.  Check BP and HR twice daily at her nursing facitlily.  Bring results to f/u.  Encourage hydration and wear compression stockings.   [facility] will sign off.   Medication Recommendations:  continue current meds Other recommendations (labs, testing, etc):  check BP/HR twice daily.  14 day monitor.  Follow up as an outpatient:  scheduled with  EP   For questions or updates, please contact [facility] Please consult [email or web address] for contact info under     Signed, Annabella Scarce, MD  08/25/2023, 10:04 AM

## 2023-08-25 NOTE — TOC Transition Note (Signed)
 Transition of Care [facility]) - Discharge Note   Patient Details  Name: Tracey Rosario MRN: [id number] Date of Birth: 1926-08-29  [facility]) CM/SW Contact:  [name], LCSWA Phone Number: 08/25/2023, 3:49 PM   Clinical Narrative:   [facility] Anticipated DC date: 08/25/23  Family notified: [name] (dtr) Transport by: [name] (dtr)   Per MD patient ready for DC to [facility]. RN to call report prior to discharge [phone]). RN, patient, patient's family, and facility notified of DC. Discharge Summary and FL2 sent to facility.   CSW will sign off for now as social work intervention is no longer needed. Please consult us  again if new needs arise.      Final next level of care: Skilled Nursing Facility Barriers to Discharge: Barriers Resolved   Patient Goals and CMS Choice Patient states their goals for this hospitalization and ongoing recovery are:: To return to ALF          Discharge Placement              Patient chooses bed at: Other - please specify in the comment section below: ([facility]) Patient to be transferred to facility by: [name] (Daughter)  [phone] Name of family member notified: [name] (Daughter)  [phone] Patient and family notified of of transfer: 08/25/23  Discharge Plan and Services Additional resources added to the After Visit Summary for   In-house Referral: Clinical Social Work                                   Social Drivers of Health (SDOH) Interventions SDOH Screenings   Food Insecurity: No Food Insecurity (08/22/2023)  Housing: Low Risk  (08/22/2023)  Transportation Needs: No Transportation Needs (08/22/2023)  Utilities: Not At Risk (08/22/2023)  Social Connections: Moderately Isolated (08/22/2023)  Tobacco Use: Medium Risk (08/22/2023)     Readmission Risk Interventions    01/12/2022    1:09 PM  Readmission Risk Prevention Plan   Transportation Screening Complete  PCP or Specialist Appt within 3-5 Days Complete  HRI or Home Care Consult Complete  Social Work Consult for Recovery Care Planning/Counseling Complete  Palliative Care Screening Complete  Medication Review Oceanographer) Complete

## 2023-08-25 NOTE — NC FL2 (Addendum)
 [place]  MEDICAID FL2 LEVEL OF CARE FORM     IDENTIFICATION  Patient Name: Tracey Rosario Birthdate: 01-Aug-1926 Sex: female Admission Date (Current Location): 08/22/2023  [facility] and [place] Number:  Producer, television/film/video and Address:  The . [facility], [address] [place], [place] [address]      Provider Number: [id number]  Attending Physician Name and Address:  Francesco Elsie NOVAK, MD  Relative Name and Phone Number:  Eldonna Pulling (Daughter)  [phone] [facility])    Current Level of Care: Hospital Recommended Level of Care: Memory Care Prior Approval Number:    Date Approved/Denied:   PASRR Number:    Discharge Plan: Other (Comment) (Memory Care)     Current Diagnoses: Patient Active Problem List   Diagnosis Date Noted   Typical atrial flutter (HCC) 08/23/2023   Tachycardia-bradycardia syndrome (HCC) 08/23/2023   Head injury 08/23/2023   Syncope 08/23/2023   Syncope and collapse 08/22/2023   Hypokalemia 01/12/2022   Medication management 01/07/2022   Pneumonia of right lung due to infectious organism 01/07/2022   RSV infection 01/07/2022   Palliative care encounter 01/06/2022   Acute hypoxic respiratory failure (HCC) 01/06/2022   Goals of care, counseling/discussion 01/06/2022   DNR (do not resuscitate) 01/06/2022   Shortness of breath 01/06/2022   High risk medication use 01/06/2022   Agitation 01/06/2022   Sepsis with acute hypoxic respiratory failure without septic shock (HCC) 01/06/2022   Need for emotional support 01/06/2022   Counseling and coordination of care 01/06/2022   Severe sepsis (HCC) 01/05/2022   Toxic encephalopathy 12/23/2021   Bradycardia 04/22/2019   Age-related memory disorder 11/12/2018   Other osteoporosis without current pathological fracture 01/28/2016   Memory impairment 10/15/2015   Esophageal reflux 06/11/2015   Small vessel disease (HCC) 04/30/2015   Essential hypertension 04/07/2015   Tremor  04/07/2015   Major depressive disorder with single episode, in full remission (HCC) 03/30/2015   Absolute anemia 03/30/2015   Atrial fibrillation (HCC) 10/16/2013   Asymptomatic PVCs 08/01/2013   Hyperlipidemia 06/04/2013    Orientation RESPIRATION BLADDER Height & Weight     Self, Time, Place  Normal Continent Weight: 157 lb 13.6 oz (71.6 kg) Height:  5' 11 (180.3 cm)  BEHAVIORAL SYMPTOMS/MOOD NEUROLOGICAL BOWEL NUTRITION STATUS      Continent  (low sodium-heart healthy)  AMBULATORY STATUS COMMUNICATION OF NEEDS Skin   Supervision Verbally Other (Comment) (Wound -Face Right;Upper)                       Personal Care Assistance Level of Assistance  Bathing, Feeding, Dressing Bathing Assistance: Limited assistance Feeding assistance: Independent Dressing Assistance: Limited assistance     Functional Limitations Info  Sight, Speech, Hearing Sight Info: Adequate Hearing Info: Adequate Speech Info: Adequate    SPECIAL CARE FACTORS FREQUENCY  OT (By licensed OT)     PT Frequency: 1x week OT Frequency: 1x week            Contractures Contractures Info: Not present    Additional Factors Info  Code Status, Allergies Code Status Info: DNR limited Allergies Info: Codeine           Current Medications (08/25/2023):   TAKE these medications     albuterol  108 (90 Base) MCG/ACT inhaler Commonly known as: VENTOLIN  HFA Inhale 2 puffs into the lungs every 6 (six) hours as needed for wheezing or shortness of breath.    amLODipine  5 MG tablet Commonly known as: NORVASC  Take  5 mg by mouth daily.    Baza Protect Moisture Barrier 12 % Crea Generic drug: Zinc Oxide Apply 1 application  topically daily.    citalopram  20 MG tablet Commonly known as: CELEXA  Take 20 mg by mouth daily.    Culturelle Caps Take 1 capsule by mouth daily.    donepezil  23 MG Tabs tablet Commonly known as: ARICEPT  Take 23 mg by mouth at bedtime.    FeroSul 325 (65 FE) MG  tablet Generic drug: ferrous sulfate  Take 325 mg by mouth daily.    furosemide  20 MG tablet Commonly known as: LASIX  Take 10 mg by mouth daily.    gabapentin  300 MG capsule Commonly known as: NEURONTIN  Take 300 mg by mouth 3 (three) times daily as needed (for neuropathic pain).    GENTEAL TEARS OP Place 1 drop into both eyes 3 (three) times daily as needed (dry eyes).    Gerhardt's butt cream Crea Apply 1 Application topically daily.    levothyroxine  75 MCG tablet Commonly known as: SYNTHROID  Take 75 mcg by mouth daily before breakfast.    lisinopril  5 MG tablet Commonly known as: ZESTRIL  Take 5 mg by mouth daily.    loperamide 2 MG tablet Commonly known as: IMODIUM A-D Take 2 mg by mouth every 6 (six) hours as needed for diarrhea or loose stools.    memantine  5 MG tablet Commonly known as: NAMENDA  TAKE 1 TABLET BY MOUTH EVERYDAY AT BEDTIME    mirtazapine  7.5 MG tablet Commonly known as: REMERON  Take 7.5 mg by mouth at bedtime.    pantoprazole  40 MG tablet Commonly known as: PROTONIX  Take 1 tablet (40 mg total) by mouth daily. Start taking on: August 26, 2023 Replaces: omeprazole  20 MG capsule    SYSTANE ULTRA OP Place 1 drop into both eyes 3 (three) times daily.    Vitamin D3 125 MCG (5000 UT) Caps Take 1 capsule by mouth daily.    Relevant Imaging Results:  Relevant Lab Results:   Additional Information SSN [id number] [place], LCSWA

## 2023-08-25 NOTE — Plan of Care (Signed)
  Problem: Education: Goal: Knowledge of disease or condition will improve Outcome: Progressing   Problem: Safety: Goal: Ability to remain free from injury will improve Outcome: Progressing

## 2023-08-25 NOTE — TOC Progression Note (Signed)
 Transition of Care [facility]) - Progression Note    Patient Details  Name: Tracey Rosario MRN: [id number] Date of Birth: 12-02-1926  [facility]) CM/SW Contact  [name], [place] Phone Number: 08/25/2023, 4:39 PM  Clinical Narrative:   Late entry  10:34 AM: Teaching service secure chatted CSW to inquire about patient discharging. CSW stated patient can discharge today but facility will need DC summary, FL2, and HH order. CSW asked for DC summary by 1:30PM to finalize FL2 as per [facility] with [name] DC summary and FL2 need to match.   2:42PM CSW inquired with teaching service about DC summary as patients daughter is ready to take patient to facility. Per teaching service, they are backed up. CSW informed patients daughter, [name], that CSW is awaiting DC summary to send to facility. CSW informed [name] that it is the facility's policy to have DC paperwork prior to patient discharging.   3:40PM CSW spoke with [facility]. Per [employer], patient can return to facility.   3:47PM CSW notified [name] that paperwork has been sent to facility and per facility admissions patient can come back.    Expected Discharge Plan: Assisted Living (with HHPT) Barriers to Discharge: Barriers Resolved               Expected Discharge Plan and Services In-house Referral: Clinical Social Work     Living arrangements for the past 2 months: Assisted Living Facility Expected Discharge Date: 08/25/23                                     Social Drivers of Health (SDOH) Interventions SDOH Screenings   Food Insecurity: No Food Insecurity (08/22/2023)  Housing: Low Risk  (08/22/2023)  Transportation Needs: No Transportation Needs (08/22/2023)  Utilities: Not At Risk (08/22/2023)  Social Connections: Moderately Isolated (08/22/2023)  Tobacco Use: Medium Risk (08/22/2023)    Readmission Risk Interventions    01/12/2022    1:09 PM   Readmission Risk Prevention Plan  Transportation Screening Complete  PCP or Specialist Appt within 3-5 Days Complete  HRI or Home Care Consult Complete  Social Work Consult for Recovery Care Planning/Counseling Complete  Palliative Care Screening Complete  Medication Review Oceanographer) Complete

## 2023-08-25 NOTE — Progress Notes (Signed)
 Physical Therapy Treatment Patient Details Name: Tracey Rosario MRN: [id number] DOB: 03-19-1926 Today's Date: 08/25/2023   History of Present Illness Pt is a 89 y.o. female who presented 08/22/23 s/p syncope. Imaging negative for acute injuries. PMH: anemia, arthropathy, bradycardia, CKD, cognitive decline, depression, GERD, HLD, HTN, hypertensive retinopathy, hypothyroidism, insomnia, macular degeneration, PSVT, short-term memory loss, dementia    PT Comments  Pt was likely very close to her baseline and generally at supervision to CGA.  Emphasis on safety in general and specifically with the rollator and brakes.     If plan is discharge home, recommend the following: A little help with walking and/or transfers;A little help with bathing/dressing/bathroom;Assistance with cooking/housework;Direct supervision/assist for medications management;Direct supervision/assist for financial management;Assist for transportation;Supervision due to cognitive status   Can travel by private vehicle        Equipment Recommendations       Recommendations for Other Services       Precautions / Restrictions Precautions Precautions: Fall Recall of Precautions/Restrictions: Impaired Precaution/Restrictions Comments: watch HR/BP Restrictions Weight Bearing Restrictions Per Provider Order: No     Mobility  Bed Mobility Overal bed mobility: Needs Assistance             General bed mobility comments: Received EOB    Transfers Overall transfer level: Needs assistance Equipment used: Rolling walker (2 wheels) Transfers: Sit to/from Stand Sit to Stand: Contact guard assist           General transfer comment: Cues for hand placement, use of momentum,CGA to come to stand    Ambulation/Gait   Gait Distance (Feet): 500 Feet Assistive device: Rollator (4 wheels), None Gait Pattern/deviations: Step-through pattern       General Gait Details: generally steady whether ambulating  with rollator or no device.  pt does not use the brakes when sitting in the rollator nor does she remember to lock or engage brakes when stationary talking to the therapist.   We had a safety discussion, but pt may not be able to remember.   Stairs             Wheelchair Mobility     Tilt Bed    Modified Rankin (Stroke Patients Only)       Balance Overall balance assessment: Needs assistance Sitting-balance support: Feet supported Sitting balance-Leahy Scale: Good     Standing balance support: Bilateral upper extremity supported Standing balance-Leahy Scale: Fair                              Hotel manager: No apparent difficulties  Cognition Arousal: Alert Behavior During Therapy: WFL for tasks assessed/performed                             Following commands: Impaired Following commands impaired: Follows one step commands with increased time    Cueing Cueing Techniques: Verbal cues  Exercises      General Comments        Pertinent Vitals/Pain Pain Assessment Pain Assessment: Faces Faces Pain Scale: No hurt Pain Intervention(s): Monitored during session    Home Living                          Prior Function            PT Goals (current goals can now be found in the care plan section) Acute Rehab  PT Goals Patient Stated Goal: to get stronger PT Goal Formulation: With patient Time For Goal Achievement: 09/06/23 Potential to Achieve Goals: Good Progress towards PT goals: Progressing toward goals    Frequency    Min 1X/week      PT Plan      Co-evaluation              AM-PAC PT 6 Clicks Mobility   Outcome Measure  Help needed turning from your back to your side while in a flat bed without using bedrails?: A Little Help needed moving from lying on your back to sitting on the side of a flat bed without using bedrails?: A Little Help needed moving to and from a bed to  a chair (including a wheelchair)?: A Little Help needed standing up from a chair using your arms (e.g., wheelchair or bedside chair)?: A Little Help needed to walk in hospital room?: A Little Help needed climbing 3-5 steps with a railing? : A Little 6 Click Score: 18    End of Session   Activity Tolerance: Patient tolerated treatment well Patient left: in chair;with call bell/phone within reach;with chair alarm set;Other (comment) Nurse Communication: Mobility status PT Visit Diagnosis: Unsteadiness on feet (R26.81)     Time: 8468-8454 PT Time Calculation (min) (ACUTE ONLY): 14 min  Charges:    $Gait Training: 8-22 mins PT General Charges $$ ACUTE PT VISIT: 1 Visit                     08/25/2023  India HERO., PT Acute Rehabilitation Services [phone]  (office)   Vinie GAILS Tymeir [place] 08/25/2023, 3:53 PM

## 2023-08-25 NOTE — Progress Notes (Signed)
 Ordering 14 day live Zio to be placed prior to discharge

## 2023-08-25 NOTE — Progress Notes (Signed)
 Occupational Therapy Treatment Patient Details Name: Tony Friscia MRN: [id number] DOB: 07/13/1926 Today's Date: 08/25/2023   History of present illness Pt is a 89 y.o. female who presented 08/22/23 s/p syncope. Imaging negative for acute injuries. PMH: anemia, arthropathy, bradycardia, CKD, cognitive decline, depression, GERD, HLD, HTN, hypertensive retinopathy, hypothyroidism, insomnia, macular degeneration, PSVT, short-term memory loss, dementia   OT comments  Pt progressing towards goals. Progressed to complete ~53ft in room mobility with Rw and CGA. Pt requiring cues for navigating objects in room. Continues to be limited by decreased balance, and cog creating high fall risk. Continue to recommend HHOT at pt's familiar environment. Will continue to follow acutely.      If plan is discharge home, recommend the following:  A little help with walking and/or transfers;A little help with bathing/dressing/bathroom;Assistance with cooking/housework;Direct supervision/assist for medications management;Direct supervision/assist for financial management;Assist for transportation;Help with stairs or ramp for entrance   Equipment Recommendations  Other (comment)       Precautions / Restrictions Precautions Precautions: Fall Recall of Precautions/Restrictions: Impaired Precaution/Restrictions Comments: watch HR/BP Restrictions Weight Bearing Restrictions Per Provider Order: No       Mobility Bed Mobility Overal bed mobility: Needs Assistance   General bed mobility comments: Received EOB    Transfers Overall transfer level: Needs assistance Equipment used: Rolling walker (2 wheels) Transfers: Sit to/from Stand Sit to Stand: Min assist           General transfer comment: Cues for hand placement, use of momentum, min assist to come to stand     Balance Overall balance assessment: Needs assistance Sitting-balance support: Feet supported Sitting balance-Leahy Scale:  Good     Standing balance support: Bilateral upper extremity supported Standing balance-Leahy Scale: Fair       ADL either performed or assessed with clinical judgement   ADL Overall ADL's : Needs assistance/impaired Eating/Feeding: Set up;Sitting     Toilet Transfer: Contact guard assist;Rolling walker (2 wheels);Ambulation Toilet Transfer Details (indicate cue type and reason): Simulated in room     Functional mobility during ADLs: Contact guard assist;Rolling walker (2 wheels)      Extremity/Trunk Assessment Upper Extremity Assessment Upper Extremity Assessment: Generalized weakness   Lower Extremity Assessment Lower Extremity Assessment: Defer to PT evaluation        Vision   Vision Assessment?: No apparent visual deficits         Communication Communication Communication: No apparent difficulties   Cognition Arousal: Alert Behavior During Therapy: WFL for tasks assessed/performed Cognition: No family/caregiver present to determine baseline, History of cognitive impairments   OT - Cognition Comments: Pt was able to report that she is in the hospital and she had a fall. Required cues for safety with mobility       Following commands: Impaired Following commands impaired: Follows one step commands with increased time      Cueing   Cueing Techniques: Verbal cues             Pertinent Vitals/ Pain       Pain Assessment Pain Assessment: No/denies pain   Frequency  Min 2X/week        Progress Toward Goals  OT Goals(current goals can now be found in the care plan section)  Progress towards OT goals: Progressing toward goals  Acute Rehab OT Goals Patient Stated Goal: None stated OT Goal Formulation: With patient Time For Goal Achievement: 09/07/23 Potential to Achieve Goals: Good ADL Goals Pt Will Perform Upper Body Bathing: sitting;with modified independence Pt  Will Perform Lower Body Bathing: with contact guard assist;sit to/from  stand;sitting/lateral leans Pt Will Perform Upper Body Dressing: sitting;with modified independence Pt Will Perform Lower Body Dressing: with contact guard assist;sit to/from stand Pt Will Transfer to Toilet: with contact guard assist;ambulating  Plan         AM-PAC OT 6 Clicks Daily Activity     Outcome Measure   Help from another person eating meals?: A Little Help from another person taking care of personal grooming?: A Little Help from another person toileting, which includes using toliet, bedpan, or urinal?: A Little Help from another person bathing (including washing, rinsing, drying)?: A Lot Help from another person to put on and taking off regular upper body clothing?: A Little Help from another person to put on and taking off regular lower body clothing?: A Lot 6 Click Score: 16    End of Session Equipment Utilized During Treatment: Gait belt;Rolling walker (2 wheels)  OT Visit Diagnosis: Unsteadiness on feet (R26.81);Other abnormalities of gait and mobility (R26.89);Repeated falls (R29.6);Muscle weakness (generalized) (M62.81);History of falling (Z91.81)   Activity Tolerance Patient tolerated treatment well   Patient Left in chair;with call bell/phone within reach;with chair alarm set   Nurse Communication Mobility status        Time: 1211-1222 OT Time Calculation (min): 11 min  Charges: OT General Charges $OT Visit: 1 Visit OT Treatments $Self Care/Home Management : 8-22 mins  Adrianne BROCKS, OT  Acute Rehabilitation Services Office [phone] Secure chat preferred   Adrianne GORMAN Savers 08/25/2023, 1:13 PM

## 2023-08-30 NOTE — Progress Notes (Signed)
 [facility] - Clinic Note  09/01/2023     CHIEF COMPLAINT Patient presents for Retina Follow Up  HISTORY OF PRESENT ILLNESS: Tracey Rosario is a 89 y.o. female who presents to the clinic today for:   HPI     Retina Follow Up   Patient presents with  Wet AMD.  In both eyes.  This started 5 years ago.  Severity is moderate.  Duration of 10 weeks.  Since onset it is stable.  I, the attending physician,  performed the HPI with the patient and updated documentation appropriately.        Comments   Pt states she has been seeing a lot. Pt had floaters for a day but they are not present today. Pt denies FOL/pain. Pt was hospitalized for a fall.      Last edited by Valdemar Rogue, MD on 09/02/2023 12:20 AM.      Pt states she's doing well, 'can see enough'.    Referring physician: No referring provider defined for this encounter.  HISTORICAL INFORMATION:   Selected notes from the MEDICAL RECORD NUMBER Referred by Dr. Medford Ferrier for retinal eval   CURRENT MEDICATIONS: Current Outpatient Medications (Ophthalmic Drugs)  Medication Sig   Artificial Tear Solution (GENTEAL TEARS OP) Place 1 drop into both eyes 3 (three) times daily as needed (dry eyes).   Polyethyl Glycol-Propyl Glycol (SYSTANE ULTRA OP) Place 1 drop into both eyes 3 (three) times daily.   No current facility-administered medications for this visit. (Ophthalmic Drugs)   Current Outpatient Medications (Other)  Medication Sig   albuterol  (VENTOLIN  HFA) 108 (90 Base) MCG/ACT inhaler Inhale 2 puffs into the lungs every 6 (six) hours as needed for wheezing or shortness of breath. (Patient not taking: Reported on 08/22/2023)   amLODipine  (NORVASC ) 5 MG tablet Take 5 mg by mouth daily.   Cholecalciferol  (VITAMIN D3) 125 MCG (5000 UT) CAPS Take 1 capsule by mouth daily.   citalopram  (CELEXA ) 20 MG tablet Take 20 mg by mouth daily.   donepezil  (ARICEPT ) 23 MG TABS tablet Take 23 mg by mouth  at bedtime.   ferrous sulfate  (FEROSUL) 325 (65 FE) MG tablet Take 325 mg by mouth daily.   furosemide  (LASIX ) 20 MG tablet Take 10 mg by mouth daily.   gabapentin  (NEURONTIN ) 300 MG capsule Take 300 mg by mouth 3 (three) times daily as needed (for neuropathic pain). (Patient not taking: Reported on 08/22/2023)   Lactobacillus Rhamnosus, GG, (CULTURELLE) CAPS Take 1 capsule by mouth daily.   levothyroxine  (SYNTHROID ) 75 MCG tablet Take 75 mcg by mouth daily before breakfast.   lisinopril  (ZESTRIL ) 5 MG tablet Take 5 mg by mouth daily.   loperamide (IMODIUM A-D) 2 MG tablet Take 2 mg by mouth every 6 (six) hours as needed for diarrhea or loose stools.   memantine  (NAMENDA ) 5 MG tablet TAKE 1 TABLET BY MOUTH EVERYDAY AT BEDTIME   mirtazapine  (REMERON ) 7.5 MG tablet Take 7.5 mg by mouth at bedtime.   Nystatin (GERHARDT'S BUTT CREAM) CREA Apply 1 Application topically daily. (Patient not taking: Reported on 08/22/2023)   [Paused] oxybutynin  (DITROPAN -XL) 10 MG 24 hr tablet Take 10 mg by mouth daily.   pantoprazole  (PROTONIX ) 40 MG tablet Take 1 tablet (40 mg total) by mouth daily.   Zinc Oxide (BAZA PROTECT MOISTURE BARRIER) 12 % CREA Apply 1 application  topically daily.   No current facility-administered medications for this visit. (Other)   REVIEW OF SYSTEMS: ROS  Positive for: Gastrointestinal, Neurological, Genitourinary, Musculoskeletal, Cardiovascular, Eyes Negative for: Constitutional, Skin, HENT, Endocrine, Respiratory, Psychiatric, Allergic/Imm, Heme/Lymph Last edited by Elnor Avelina RAMAN, COT on 09/01/2023 10:58 AM.      ALLERGIES Allergies  Allergen Reactions   Codeine Other (See Comments)    Altered mental status   PAST MEDICAL HISTORY Past Medical History:  Diagnosis Date   Acid reflux    Anemia    Arthropathy    Bradycardia    beta blocker reduced in 2021 due to this   CKD (chronic kidney disease), stage III (HCC)    Cognitive decline    referred to neurologist    Depression 10/07/2013   daughter didn't know anything about this   Dizziness    Frequent PVCs    GERD (gastroesophageal reflux disease) 05/04/2013   Hip fx (HCC)    r hip, bilat pubis rami   Hyperlipemia    Hypertension    Hypertensive retinopathy    OU   Hypothyroidism    Insomnia    Macular degeneration    Exu OS   Multilevel degenerative disc disease    Palpitations 05/04/2013   saw cardiologist in 2015, he deleted A. Fib from the record as per note not documentation to support   Premature atrial contractions    PSVT (paroxysmal supraventricular tachycardia) (HCC)    a. short runs by monitor in 2021.   Short-term memory loss    Vitamin D  deficiency    Past Surgical History:  Procedure Laterality Date   ABDOMINAL HYSTERECTOMY  1971   BLADDER SURGERY  2010   Broken Pelvis  2015   C-EYE SURGERY PROCEDURE Bilateral    zaldivar   CATARACT EXTRACTION Bilateral    Done in [place]    EYE SURGERY Bilateral    Cat Sx   HIP PINNING,CANNULATED Right 10/07/2013   Procedure: CANNULATED screws right hip;  Surgeon: Lonni CINDERELLA Poli, MD;  Location: [facility] OR;  Service: Orthopedics;  Laterality: Right;   FAMILY HISTORY Family History  Problem Relation Age of Onset   Prostate cancer Father    Heart attack Sister    Cancer Brother    Cancer Sister    Heart attack Sister    Cancer Sister    Stroke Daughter    Dementia Neg Hx    Alzheimer's disease Neg Hx    SOCIAL HISTORY Social History   Tobacco Use   Smoking status: Former    Current packs/day: 0.00    Types: Cigarettes    Quit date: 08/02/1970    Years since quitting: 53.1   Smokeless tobacco: Never   Tobacco comments:    smoked very little  Vaping Use   Vaping status: Never Used  Substance Use Topics   Alcohol  use: Never    Alcohol /week: 0.0 standard drinks of alcohol    Drug use: Never       OPHTHALMIC EXAM:  Base Eye Exam     Visual Acuity (Snellen - Linear)       Right Left   Dist [place] 20/40 +2 20/60 +2    Dist ph [place] NI NI         Tonometry (Tonopen, 10:53 AM)       Right Left   Pressure 14 13         Pupils       Pupils Dark Light Shape React APD   Right PERRL 2 1 Round Brisk None   Left PERRL 2 1 Round Brisk None  Visual Fields       Left Right    Full Full         Extraocular Movement       Right Left    Full, Ortho Full, Ortho         Neuro/Psych     Oriented x3: Yes   Mood/Affect: Normal         Dilation     Both eyes: 1.0% Mydriacyl, 2.5% Phenylephrine @ 10:55 AM           Slit Lamp and Fundus Exam     Slit Lamp Exam       Right Left   Lids/Lashes Dermatochalasis - upper lid Dermatochalasis - upper lid   Conjunctiva/Sclera White and quiet White and quiet   Cornea Arcus, 1+ Punctate epithelial erosions Arcus, 2-3+ Punctate epithelial erosions, EBMD, irregular epi   Anterior Chamber deep and clear deep and clear   Iris Round and moderately dilated Round and moderately dilated to 5mm, mild iridodonesis   Lens PC IOL in good position with open PC, anterior capsular phimosis PC IOL in good position with open PC   Anterior Vitreous Vitreous syneresis, Posterior vitreous detachment Vitreous syneresis; PVD; vitreous condensations         Fundus Exam       Right Left   Disc Mild temporal Pallor, Peripapillary atrophy, Sharp rim, Compact mild tilt, 2+ temporal Pallor, Peripapillary atrophy, Compact, Sharp rim   C/D Ratio 0.1 0.0   Macula Flat, Blunted foveal reflex, mild Epiretinal membrane, drusen, RPE mottling and clumping, No heme, trace cystic changes centrally -- slightly improved Flat, Blunted foveal reflex, RPE mottling and clumping, Epiretinal membrane, central cystic changes -- slightly increased, No heme   Vessels attenuated, mild tortuosity attenuated, Tortuous   Periphery IT schisis cavity from 0700-0830; otherwise attached; stable improvement in [facility] and exudation under schisis cavities [facility] not red and pigmenting in),  exudation resolved, focal pigmented CR scar at 0630; no red heme temporal sub-retinal hemorrhage improved - now pigmented CR atrophy;  No SRF -- completely resolved, shallow schisis IT quad, No heme            IMAGING AND PROCEDURES  Imaging and Procedures for @TODAY @  OCT, Retina - OU - Both Eyes       Right Eye Quality was good. Central Foveal Thickness: 333. Progression has improved. Findings include normal foveal contour, no IRF, no SRF, retinal drusen , epiretinal membrane, macular pucker, pigment epithelial detachment (Persistent ERM with trace cystic changes inferior to fovea--improved, bullous schisis IT quad caught on widefield - not imaged today).   Left Eye Quality was good. Central Foveal Thickness: 380. Progression has worsened. Findings include normal foveal contour, no SRF, abnormal foveal contour, retinal drusen , cystoid macular edema, epiretinal membrane, preretinal fibrosis (Interval increase in central cystic changes, persistent ERM with PRF).   Notes *Images captured and stored on drive  Diagnosis / Impression:  OD: Persistent ERM with trace cystic changes inferior to fovea--improved, bullous schisis IT quad caught on widefield - not imaged today OS: Interval increase in central cystic changes, persistent ERM with PRF  Clinical management:  See below  Abbreviations: NFP - Normal foveal profile. CME - cystoid macular edema. PED - pigment epithelial detachment. IRF - intraretinal fluid. SRF - subretinal fluid. EZ - ellipsoid zone. ERM - epiretinal membrane. ORA - outer retinal atrophy. ORT - outer retinal tubulation. SRHM - subretinal hyper-reflective material      Intravitreal Injection, Pharmacologic Agent -  OS - Left Eye       Time Out 09/01/2023. 11:39 AM. Confirmed correct patient, procedure, site, and patient consented.   Anesthesia Topical anesthesia was used. Anesthetic medications included Lidocaine  2%, Proparacaine 0.5%.   Procedure Preparation  included 5% betadine to ocular surface, eyelid speculum. A supplied (32g) needle was used.   Injection: 6 mg faricimab -svoa 6 MG/0.05ML   Route: Intravitreal, Site: Left Eye   NDC: 49757-903-93, Lot: [id number], Expiration date: 08/01/2024, Waste: 0 mL   Post-op Post injection exam found visual acuity of at least counting fingers. The patient tolerated the procedure well. There were no complications. The patient received written and verbal post procedure care education. Post injection medications were not given.   Notes Return in            ASSESSMENT/PLAN:    ICD-10-CM   1. Exudative age-related macular degeneration of both eyes with active choroidal neovascularization (HCC)  H35.3231 OCT, Retina - OU - Both Eyes    Intravitreal Injection, Pharmacologic Agent - OS - Left Eye    faricimab -svoa (VABYSMO ) 6mg /0.12mL intravitreal injection    2. Subretinal hemorrhage of both eyes  H35.63     3. Serous retinal detachment of left eye  H33.22     4. 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 Essential hypertension  I10     7. Hypertensive retinopathy of both eyes  H35.033     8. Pseudophakia of both eyes  Z96.1      1-3. Exudative age related macular degeneration / PECHR OU - interval conversion to exu ARMD OD noted on 11.18.22 exam -- new SRH and exudates IT periphery  - initially presented with symptomatic floaters OS - initial exam showed large peripheral temporal subretinal hemorrhage OS -- spanning 2-430 -- also mild vitreous opacities and condensations that were causing symptomatic floaters  - B-scan (9.18.20) showed hyperreflective, ill-defined mass, 0300 periphery OS - was seen by Dr. Beatris, Ocular Oncologist, on 9.22.2020 -- no tumor on exam or b-scan  - s/p IVA OS #1 (08.05.20) for subretinal hemorrhage, peripheral CNV - s/p IVA OD #1 (11.18.22), #2 (12.16.22), #3 (01.13.23), #4 (02.10.23), #5  (03.24.23) ======================== - s/p IVE OD #1 (05.08.23), #2 (06.21.23), #3 (08.16.23), #4 (02.27.24), #5 (06.05.24), #6 (10.10.24), #7 (01.08.25), #8 (02.26.25), #9 (06.18.25) - s/p IVE OS #1 (09.02.20) - sample, #2 (10.12.20), #3 (11.11.20), #4 (12.09.20), #5 (1.20.21), #6 (02.17.21), #7 (03.19.21), #8 (04.30.21), #9 (06.16.21), #10 (08.11.21), #11 (12.01.21), #12 (01.26.22), #13 (03.30.22), #14 (06.01.22), #15 (08.10.22), #16 (09.23.22), #17 (11.18.22), #18 (02.10.23), #19 (03.24.23), #20 (05.08.23), #21 (06.21.23), #22 (08.16.23), #23 (10.25.23), #24 (12.20.23), #25 (02.27.24) ==========================  - s/p IVV OS #1 (04.24.24), #2 (06.05.24), #3 (07.17.24), #4 (08.28.24), #5 (10.10.24), #6 (11.20.24), #7 (01.08.25), #8 (02.26.25), #9 (04.23.25), #10 (06.18.25)  ==========================  - today BCVA OD 20/40 from 20/60, OS 20/60  - exam shows peripheral SRH stably improved OU - OCT shows OD: Persistent ERM with trace cystic changes inferior to fovea--improved, bullous schisis IT quad caught on widefield - not imaged today; OS: Interval increase in central cystic changes, persistent ERM with PRF at 10  weeks  - recommend IVV OS #11 today, 08.29.25 w/ f/u back to  8-9wks, holding OD injection today.   - working plan for OD is possibly injection every other visit (~q3-4 mos)  - pt in agreement and wishes to proceed with injection OS  - RBA of  procedure discussed, questions answered   - see procedure note  - IVV informed consent obtained and signed 04.24.24 (OS)  - Pt okay for all treatments / meds, payable 100% (Medicare and Tricare) - f/u 8-9 weeks, sooner prn, OCT -- scan through IT schisis OU, DFE, possible injection(s)  4. Epiretinal membrane, OU.  - mild ERM OU  - asymptomatic, no metamorphopsia  - no indication for surgery at this time  - continue to monitor  5. Retinoschisis OD -- stable - schisis cavity, IT quadrant, from 0700-0830, -- ?less posterior extension  ?collapsing  - no associated RT/RD but now with [facility] and exudates -- improving   - discussed findings and prognosis  - continue to monitor  6,7. Hypertensive retinopathy OU  - discussed importance of tight BP control  - continue to monitor  8. Pseudophakia OU  - s/p CE/IOL OU  - doing well  - continue to monitor  Ophthalmic Meds Ordered this visit:  Meds ordered this encounter  Medications   faricimab -svoa (VABYSMO ) 6mg /0.54mL intravitreal injection     Return for 8-9wks exu ARMD OU, DFE, OCT-scan thru schisis OU.  There are no Patient Instructions on file for this visit.   Explained the diagnoses, plan, and follow up with the patient and they expressed understanding.  Patient expressed understanding of the importance of proper follow up care.  This document serves as a record of services personally performed by Redell JUDITHANN Hans, MD, PhD. It was created on their behalf by Avelina Pereyra, COA an ophthalmic technician. The creation of this record is the provider's dictation and/or activities during the visit.   Electronically signed by: Avelina GORMAN Pereyra, COT  09/02/23  12:37 AM   This document serves as a record of services personally performed by Redell JUDITHANN Hans, MD, PhD. It was created on their behalf by Almetta Pesa, an ophthalmic technician. The creation of this record is the provider's dictation and/or activities during the visit.    Electronically signed by: Almetta Pesa, OA, 09/02/23  12:37 AM  Redell JUDITHANN Hans, M.D., Ph.D. Diseases & Surgery of the Retina and Vitreous [facility]  I have reviewed the above documentation for accuracy and completeness, and I agree with the above. Redell JUDITHANN Hans, M.D., Ph.D. 09/02/23 12:40 AM   Abbreviations: M myopia (nearsighted); A astigmatism; H hyperopia (farsighted); P presbyopia; Mrx spectacle prescription;  CTL contact lenses; OD right eye; OS left eye; OU both eyes  XT exotropia; ET esotropia; PEK  punctate epithelial keratitis; PEE punctate epithelial erosions; DES dry eye syndrome; MGD meibomian gland dysfunction; ATs artificial tears; PFAT's preservative free artificial tears; NSC nuclear sclerotic cataract; PSC posterior subcapsular cataract; ERM epi-retinal membrane; PVD posterior vitreous detachment; RD retinal detachment; DM diabetes mellitus; DR diabetic retinopathy; NPDR non-proliferative diabetic retinopathy; PDR proliferative diabetic retinopathy; CSME clinically significant macular edema; DME diabetic macular edema; dbh dot blot hemorrhages; CWS cotton wool spot; POAG primary open angle glaucoma; C/D cup-to-disc ratio; HVF humphrey visual field; GVF goldmann visual field; OCT optical coherence tomography; IOP intraocular pressure; BRVO Branch retinal vein occlusion; CRVO central retinal vein occlusion; CRAO central retinal artery occlusion; BRAO branch retinal artery occlusion; RT retinal tear; SB scleral buckle; PPV pars plana vitrectomy; VH Vitreous hemorrhage; PRP panretinal laser photocoagulation; IVK intravitreal kenalog; VMT vitreomacular traction; MH Macular hole;  NVD neovascularization of the disc; NVE neovascularization elsewhere; AREDS age related eye disease study; ARMD age related macular degeneration; POAG primary open angle glaucoma; EBMD epithelial/anterior basement membrane dystrophy;  ACIOL anterior chamber intraocular lens; IOL intraocular lens; PCIOL posterior chamber intraocular lens; Phaco/IOL phacoemulsification with intraocular lens placement; PRK photorefractive keratectomy; LASIK laser assisted in situ keratomileusis; HTN hypertension; DM diabetes mellitus; COPD chronic obstructive pulmonary disease

## 2023-09-01 ENCOUNTER — Encounter (INDEPENDENT_AMBULATORY_CARE_PROVIDER_SITE_OTHER): Admitting: Ophthalmology

## 2023-09-01 ENCOUNTER — Ambulatory Visit (INDEPENDENT_AMBULATORY_CARE_PROVIDER_SITE_OTHER): Admitting: Ophthalmology

## 2023-09-01 ENCOUNTER — Encounter (INDEPENDENT_AMBULATORY_CARE_PROVIDER_SITE_OTHER): Payer: Self-pay | Admitting: Ophthalmology

## 2023-09-01 DIAGNOSIS — H3563 Retinal hemorrhage, bilateral: Secondary | ICD-10-CM

## 2023-09-01 DIAGNOSIS — H33101 Unspecified retinoschisis, right eye: Secondary | ICD-10-CM

## 2023-09-01 DIAGNOSIS — H3322 Serous retinal detachment, left eye: Secondary | ICD-10-CM | POA: Diagnosis not present

## 2023-09-01 DIAGNOSIS — H35373 Puckering of macula, bilateral: Secondary | ICD-10-CM

## 2023-09-01 DIAGNOSIS — I10 Essential (primary) hypertension: Secondary | ICD-10-CM

## 2023-09-01 DIAGNOSIS — Z961 Presence of intraocular lens: Secondary | ICD-10-CM

## 2023-09-01 DIAGNOSIS — H353231 Exudative age-related macular degeneration, bilateral, with active choroidal neovascularization: Secondary | ICD-10-CM

## 2023-09-01 DIAGNOSIS — H35033 Hypertensive retinopathy, bilateral: Secondary | ICD-10-CM

## 2023-09-02 ENCOUNTER — Encounter (INDEPENDENT_AMBULATORY_CARE_PROVIDER_SITE_OTHER): Payer: Self-pay | Admitting: Ophthalmology

## 2023-09-02 MED ORDER — FARICIMAB-SVOA 6 MG/0.05ML IZ SOSY
6.0000 mg | PREFILLED_SYRINGE | INTRAVITREAL | Status: AC | PRN
  Administered 2023-09-02: 6 mg via INTRAVITREAL

## 2023-09-29 DIAGNOSIS — R001 Bradycardia, unspecified: Secondary | ICD-10-CM

## 2023-09-29 DIAGNOSIS — R55 Syncope and collapse: Secondary | ICD-10-CM

## 2023-10-11 ENCOUNTER — Ambulatory Visit: Admitting: Cardiology

## 2023-10-20 ENCOUNTER — Encounter: Payer: Self-pay | Admitting: Internal Medicine

## 2023-10-20 ENCOUNTER — Ambulatory Visit: Attending: Internal Medicine | Admitting: Internal Medicine

## 2023-10-20 VITALS — BP 169/64 | HR 69 | Resp 16 | Ht 71.0 in | Wt 163.3 lb

## 2023-10-20 DIAGNOSIS — R55 Syncope and collapse: Secondary | ICD-10-CM | POA: Diagnosis present

## 2023-10-20 NOTE — Progress Notes (Signed)
 HPI Tracey Rosario is referred for evaluation of PAF and sinus node dysfunction. She is a pleasant 89 yo woman with syncope of unclear etiology. She was found to have atrial fib with a controlled VR when she was hospitalized 2 months ago. Initially the decision was made to hold off on anti-coagulation out of concern for more falls. She has not fallen since. She denies palpitations, chest pain or sob. No edema. Allergies  Allergen Reactions   Codeine Other (See Comments)    Altered mental status     Current Outpatient Medications  Medication Sig Dispense Refill   amLODipine  (NORVASC ) 5 MG tablet Take 5 mg by mouth daily.     Artificial Tear Solution (GENTEAL TEARS OP) Place 1 drop into both eyes 3 (three) times daily as needed (dry eyes).     Cholecalciferol  (VITAMIN D3) 125 MCG (5000 UT) CAPS Take 1 capsule by mouth daily.     citalopram  (CELEXA ) 20 MG tablet Take 20 mg by mouth daily.     donepezil  (ARICEPT ) 23 MG TABS tablet Take 23 mg by mouth at bedtime.     ferrous sulfate  (FEROSUL) 325 (65 FE) MG tablet Take 325 mg by mouth daily.     furosemide  (LASIX ) 20 MG tablet Take 10 mg by mouth daily.     Lactobacillus Rhamnosus, GG, (CULTURELLE) CAPS Take 1 capsule by mouth daily.     levothyroxine  (SYNTHROID ) 75 MCG tablet Take 75 mcg by mouth daily before breakfast.     lisinopril  (ZESTRIL ) 5 MG tablet Take 5 mg by mouth daily.     loperamide (IMODIUM A-D) 2 MG tablet Take 2 mg by mouth every 6 (six) hours as needed for diarrhea or loose stools.     memantine  (NAMENDA ) 5 MG tablet TAKE 1 TABLET BY MOUTH EVERYDAY AT BEDTIME 90 tablet 1   mirtazapine  (REMERON ) 7.5 MG tablet Take 7.5 mg by mouth at bedtime.     Nystatin (GERHARDT'S BUTT CREAM) CREA Apply 1 Application topically daily.     pantoprazole  (PROTONIX ) 40 MG tablet Take 1 tablet (40 mg total) by mouth daily. 30 tablet 0   Polyethyl Glycol-Propyl Glycol (SYSTANE ULTRA OP) Place 1 drop into both eyes 3 (three) times daily.      Zinc Oxide (BAZA PROTECT MOISTURE BARRIER) 12 % CREA Apply 1 application  topically daily.     albuterol  (VENTOLIN  HFA) 108 (90 Base) MCG/ACT inhaler Inhale 2 puffs into the lungs every 6 (six) hours as needed for wheezing or shortness of breath. (Patient not taking: Reported on 10/20/2023) 6.7 g 0   gabapentin  (NEURONTIN ) 300 MG capsule Take 300 mg by mouth 3 (three) times daily as needed (for neuropathic pain). (Patient not taking: Reported on 10/20/2023)     [Paused] oxybutynin  (DITROPAN -XL) 10 MG 24 hr tablet Take 10 mg by mouth daily. (Patient not taking: Reported on 10/20/2023)     No current facility-administered medications for this visit.     Past Medical History:  Diagnosis Date   Acid reflux    Anemia    Arthropathy    Bradycardia    beta blocker reduced in 2021 due to this   CKD (chronic kidney disease), stage III (HCC)    Cognitive decline    referred to neurologist   Depression 10/07/2013   daughter didn't know anything about this   Dizziness    Frequent PVCs    GERD (gastroesophageal reflux disease) 05/04/2013   Hip fx (HCC)    r  hip, bilat pubis rami   Hyperlipemia    Hypertension    Hypertensive retinopathy    OU   Hypothyroidism    Insomnia    Macular degeneration    Exu OS   Multilevel degenerative disc disease    Palpitations 05/04/2013   saw cardiologist in 2015, he deleted A. Fib from the record as per note not documentation to support   Premature atrial contractions    PSVT (paroxysmal supraventricular tachycardia)    a. short runs by monitor in 2021.   Short-term memory loss    Vitamin D  deficiency     ROS:   All systems reviewed and negative except as noted in the HPI.   Past Surgical History:  Procedure Laterality Date   ABDOMINAL HYSTERECTOMY  1971   BLADDER SURGERY  2010   Broken Pelvis  2015   C-EYE SURGERY PROCEDURE Bilateral    zaldivar   CATARACT EXTRACTION Bilateral    Done in [place]    EYE SURGERY Bilateral    Cat Sx   HIP  PINNING,CANNULATED Right 10/07/2013   Procedure: CANNULATED screws right hip;  Surgeon: Lonni CINDERELLA Poli, MD;  Location: [facility] OR;  Service: Orthopedics;  Laterality: Right;     Family History  Problem Relation Age of Onset   Prostate cancer Father    Heart attack Sister    Cancer Brother    Cancer Sister    Heart attack Sister    Cancer Sister    Stroke Daughter    Dementia Neg Hx    Alzheimer's disease Neg Hx      Social History   Socioeconomic History   Marital status: Widowed    Spouse name: Not on file   Number of children: 2   Years of education: 84   Highest education level: High school graduate  Occupational History   Occupation: Retired  Tobacco Use   Smoking status: Former    Current packs/day: 0.00    Types: Cigarettes    Quit date: 08/02/1970    Years since quitting: 53.2   Smokeless tobacco: Never   Tobacco comments:    smoked very little  Vaping Use   Vaping status: Never Used  Substance and Sexual Activity   Alcohol  use: Never    Alcohol /week: 0.0 standard drinks of alcohol    Drug use: Never   Sexual activity: Not Currently  Other Topics Concern   Not on file  Social History Narrative   Work or School: none      Home Situation: lives alone, daughter lives about 5 minutes away. One daughter lives across the street (bedridden)      Spiritual Beliefs: baptist      Lifestyle: no regular exercise, diet is ok      3 grand children and 3 great grands; 2 1/2 and 3 months       Caffeine: 1-2 cups of coffee/day   Right handed   retired   Chief Executive [employer] Strain: Not on file  Food Insecurity: No Food Insecurity (08/22/2023)   Hunger Vital Sign    Worried About Running Out of Food in the Last Year: Never true    Ran Out of Food in the Last Year: Never true  Transportation Needs: No Transportation Needs (08/22/2023)   PRAPARE - Administrator, Civil Service (Medical): No    Lack of Transportation  (Non-Medical): No  Physical Activity: Not on file  Stress: Not on file  Social Connections: Moderately  Isolated (08/22/2023)   Social Connection and Isolation Panel    Frequency of Communication with Friends and Family: More than three times a week    Frequency of Social Gatherings with Friends and Family: More than three times a week    Attends Religious Services: More than 4 times per year    Active Member of [employer] or Organizations: No    Attends Banker Meetings: Never    Marital Status: Widowed  Intimate Partner Violence: Unknown (08/22/2023)   Humiliation, Afraid, Rape, and Kick questionnaire    Fear of Current or Ex-Partner: No    Emotionally Abused: No    Physically Abused: Not on file    Sexually Abused: No     BP (!) 169/64 (BP Location: Left Arm, Patient Position: Sitting, Cuff Size: Normal)   Pulse 69   Resp 16   Ht 5' 11 (1.803 m)   Wt 163 lb 4.8 oz (74.1 kg)   SpO2 95%   BMI 22.78 kg/m   Physical Exam:  Well appearing NAD HEENT: Unremarkable Neck:  No JVD, no thyromegally Lymphatics:  No adenopathy Back:  No CVA tenderness Lungs:  Clear with no wheezes HEART:  Regular rate rhythm, no murmurs, no rubs, no clicks Abd:  soft, positive bowel sounds, no organomegally, no rebound, no guarding Ext:  2 plus pulses, no edema, no cyanosis, no clubbing Skin:  No rashes no nodules Neuro:  CN II through XII intact, motor grossly intact  EKG - sinus brady at 58/min   Assess/Plan: PAF - she appears to be back in NSR. I have discussed the treatment options with the patient and her daughter.  I recommend watchful waiting. If she goes another 6 months without falls or bleeding, I would start an OAC. If she has more falls I would not. Tachy-brady - she is really asymptomatic though I cannot rule out the possibility of a long pause causing her to fall out.  HTN -her bp is elevated in the MD's office but better at home. She is encouraged to follow and to call us   if her bp remains elevated. She will go home and take her home meds.  Danelle Jeimy Bickert,MD

## 2023-10-20 NOTE — Patient Instructions (Addendum)
 Medication Instructions:  Your physician recommends that you continue on your current medications as directed. Please refer to the Current Medication list given to you today.  *If you need a refill on your cardiac medications before your next appointment, please call your pharmacy*  Lab Work: None ordered.  You may go to any [facility] Location for your lab work:  [facility] - [address] ([facility]) - [address] [phone] [address]  [place] - [address]   [place]  - [address]   [place] - [address] - [address] [employer]  - [address] ([facility]   If you have labs (blood work) drawn today and your tests are completely normal, you will receive your results only by: [employer] (if you have MyChart)  If you have any lab test that is abnormal or we need to change your treatment, we will call you or send a MyChart message to review the results.  Testing/Procedures: None ordered.  Follow-Up: At [facility], you and your health needs are our priority.  As part of our continuing mission to provide you with exceptional heart care, we have created designated Provider Care Teams.  These Care Teams include your primary Cardiologist (physician) and Advanced Practice Providers (APPs -  Physician Assistants and Nurse Practitioners) who all work together to provide you with the care you need, when you need it.  Your next appointment:   6 months With Dr Almetta

## 2023-10-30 ENCOUNTER — Ambulatory Visit (HOSPITAL_BASED_OUTPATIENT_CLINIC_OR_DEPARTMENT_OTHER): Payer: Self-pay | Admitting: Family

## 2023-11-02 NOTE — Progress Notes (Shared)
 [facility] - Clinic Note  11/03/2023     CHIEF COMPLAINT Patient presents for Retina Follow Up  HISTORY OF PRESENT ILLNESS: Tracey Rosario is a 89 y.o. female who presents to the clinic today for:   HPI     Retina Follow Up   Patient presents with  Wet AMD.  In both eyes.  This started 5 years ago.  Severity is moderate.  Duration of 10 weeks.  Since onset it is stable.  I, the attending physician,  performed the HPI with the patient and updated documentation appropriately.        Comments   Pt states she has been seeing a lot. Pt had floaters for a day but they are not present today. Pt denies FOL/pain. Ptis using Systane 2-3 times per day.      Last edited by Valdemar Rogue, MD on 11/03/2023 12:06 PM.      Referring physician: No referring provider defined for this encounter.  HISTORICAL INFORMATION:   Selected notes from the MEDICAL RECORD NUMBER Referred by Dr. Medford Ferrier for retinal eval   CURRENT MEDICATIONS: Current Outpatient Medications (Ophthalmic Drugs)  Medication Sig   Artificial Tear Solution (GENTEAL TEARS OP) Place 1 drop into both eyes 3 (three) times daily as needed (dry eyes).   Polyethyl Glycol-Propyl Glycol (SYSTANE ULTRA OP) Place 1 drop into both eyes 3 (three) times daily.   No current facility-administered medications for this visit. (Ophthalmic Drugs)   Current Outpatient Medications (Other)  Medication Sig   albuterol  (VENTOLIN  HFA) 108 (90 Base) MCG/ACT inhaler Inhale 2 puffs into the lungs every 6 (six) hours as needed for wheezing or shortness of breath. (Patient not taking: Reported on 10/20/2023)   amLODipine  (NORVASC ) 5 MG tablet Take 5 mg by mouth daily.   Cholecalciferol  (VITAMIN D3) 125 MCG (5000 UT) CAPS Take 1 capsule by mouth daily.   citalopram  (CELEXA ) 20 MG tablet Take 20 mg by mouth daily.   donepezil  (ARICEPT ) 23 MG TABS tablet Take 23 mg by mouth at bedtime.   ferrous sulfate  (FEROSUL)  325 (65 FE) MG tablet Take 325 mg by mouth daily.   furosemide  (LASIX ) 20 MG tablet Take 10 mg by mouth daily.   gabapentin  (NEURONTIN ) 300 MG capsule Take 300 mg by mouth 3 (three) times daily as needed (for neuropathic pain). (Patient not taking: Reported on 10/20/2023)   Lactobacillus Rhamnosus, GG, (CULTURELLE) CAPS Take 1 capsule by mouth daily.   levothyroxine  (SYNTHROID ) 75 MCG tablet Take 75 mcg by mouth daily before breakfast.   lisinopril  (ZESTRIL ) 5 MG tablet Take 5 mg by mouth daily.   loperamide (IMODIUM A-D) 2 MG tablet Take 2 mg by mouth every 6 (six) hours as needed for diarrhea or loose stools.   memantine  (NAMENDA ) 5 MG tablet TAKE 1 TABLET BY MOUTH EVERYDAY AT BEDTIME   mirtazapine  (REMERON ) 7.5 MG tablet Take 7.5 mg by mouth at bedtime.   Nystatin (GERHARDT'S BUTT CREAM) CREA Apply 1 Application topically daily.   [Paused] oxybutynin  (DITROPAN -XL) 10 MG 24 hr tablet Take 10 mg by mouth daily. (Patient not taking: Reported on 10/20/2023)   pantoprazole  (PROTONIX ) 40 MG tablet Take 1 tablet (40 mg total) by mouth daily.   Zinc Oxide (BAZA PROTECT MOISTURE BARRIER) 12 % CREA Apply 1 application  topically daily.   No current facility-administered medications for this visit. (Other)   REVIEW OF SYSTEMS: ROS   Positive for: Gastrointestinal, Neurological, Genitourinary, Musculoskeletal, Cardiovascular, Eyes Negative for:  Constitutional, Skin, HENT, Endocrine, Respiratory, Psychiatric, Allergic/Imm, Heme/Lymph Last edited by Elnor Avelina RAMAN, COT on 11/03/2023  9:59 AM.     ALLERGIES Allergies  Allergen Reactions   Codeine Other (See Comments)    Altered mental status   PAST MEDICAL HISTORY Past Medical History:  Diagnosis Date   Acid reflux    Anemia    Arthropathy    Bradycardia    beta blocker reduced in 2021 due to this   CKD (chronic kidney disease), stage III (HCC)    Cognitive decline    referred to neurologist   Depression 10/07/2013   daughter didn't  know anything about this   Dizziness    Frequent PVCs    GERD (gastroesophageal reflux disease) 05/04/2013   Hip fx (HCC)    r hip, bilat pubis rami   Hyperlipemia    Hypertension    Hypertensive retinopathy    OU   Hypothyroidism    Insomnia    Macular degeneration    Exu OS   Multilevel degenerative disc disease    Palpitations 05/04/2013   saw cardiologist in 2015, he deleted A. Fib from the record as per note not documentation to support   Premature atrial contractions    PSVT (paroxysmal supraventricular tachycardia)    a. short runs by monitor in 2021.   Short-term memory loss    Vitamin D  deficiency    Past Surgical History:  Procedure Laterality Date   ABDOMINAL HYSTERECTOMY  1971   BLADDER SURGERY  2010   Broken Pelvis  2015   C-EYE SURGERY PROCEDURE Bilateral    zaldivar   CATARACT EXTRACTION Bilateral    Done in [place]    EYE SURGERY Bilateral    Cat Sx   HIP PINNING,CANNULATED Right 10/07/2013   Procedure: CANNULATED screws right hip;  Surgeon: Lonni CINDERELLA Poli, MD;  Location: [facility] OR;  Service: Orthopedics;  Laterality: Right;   FAMILY HISTORY Family History  Problem Relation Age of Onset   Prostate cancer Father    Heart attack Sister    Cancer Brother    Cancer Sister    Heart attack Sister    Cancer Sister    Stroke Daughter    Dementia Neg Hx    Alzheimer's disease Neg Hx    SOCIAL HISTORY Social History   Tobacco Use   Smoking status: Former    Current packs/day: 0.00    Types: Cigarettes    Quit date: 08/02/1970    Years since quitting: 53.2   Smokeless tobacco: Never   Tobacco comments:    smoked very little  Vaping Use   Vaping status: Never Used  Substance Use Topics   Alcohol  use: Never    Alcohol /week: 0.0 standard drinks of alcohol    Drug use: Never       OPHTHALMIC EXAM:  Base Eye Exam     Visual Acuity (Snellen - Linear)       Right Left   Dist [place] 20/80 +2 20/80   Dist ph [place] 20/50 -2 NI         Tonometry  (Tonopen, 10:07 AM)       Right Left   Pressure 12 12         Pupils       Pupils Dark Light Shape React APD   Right PERRL 2 2 Round Minimal None   Left PERRL 2 2 Round Minimal None         Visual Fields  Left Right    Full Full         Extraocular Movement       Right Left    Full, Ortho Full, Ortho         Neuro/Psych     Oriented x3: Yes   Mood/Affect: Normal         Dilation     Both eyes: 1.0% Mydriacyl, 2.5% Phenylephrine @ 10:08 AM           Slit Lamp and Fundus Exam     Slit Lamp Exam       Right Left   Lids/Lashes Dermatochalasis - upper lid Dermatochalasis - upper lid   Conjunctiva/Sclera White and quiet White and quiet   Cornea Arcus, 1+ Punctate epithelial erosions Arcus, 2-3+ Punctate epithelial erosions, EBMD, irregular epi   Anterior Chamber deep and clear deep and clear   Iris Round and moderately dilated Round and moderately dilated to 5mm, mild iridodonesis   Lens PC IOL in good position with open PC, anterior capsular phimosis PC IOL in good position with open PC   Anterior Vitreous Vitreous syneresis, Posterior vitreous detachment Vitreous syneresis; PVD; vitreous condensations         Fundus Exam       Right Left   Disc Mild temporal Pallor, Peripapillary atrophy, Sharp rim, Compact mild tilt, 2+ temporal Pallor, Peripapillary atrophy, Compact, Sharp rim   C/D Ratio 0.1 0.0   Macula Flat, Blunted foveal reflex, mild Epiretinal membrane, drusen, RPE mottling and clumping, central cystic changes-increased, no heme Flat, Blunted foveal reflex, RPE mottling and clumping, Epiretinal membrane, central cystic changes -- slightly improved, No heme   Vessels attenuated, mild tortuosity attenuated, Tortuous   Periphery IT schisis cavity from 0700-0830; otherwise attached; stable improvement in [facility] and exudation under schisis cavities [facility] not red and pigmenting in), exudation resolved, focal pigmented CR scar at 0630; no red  heme temporal sub-retinal hemorrhage improved - now pigmented CR atrophy;  No SRF -- completely resolved, shallow schisis IT quad, No heme            IMAGING AND PROCEDURES  Imaging and Procedures for @TODAY @  OCT, Retina - OU - Both Eyes       Right Eye Quality was good. Central Foveal Thickness: 501. Progression has worsened. Findings include normal foveal contour, no SRF, retinal drusen , epiretinal membrane, intraretinal fluid, macular pucker, pigment epithelial detachment (Interval redevelopment of central edema/IRF, +ERM, bullous schisis IT quad caught on widefield ).   Left Eye Quality was good. Central Foveal Thickness: 344. Progression has improved. Findings include normal foveal contour, no SRF, abnormal foveal contour, retinal drusen , cystoid macular edema, epiretinal membrane, preretinal fibrosis (Persistent central cystic changes--slightly improved, persistent ERM with PRF).   Notes *Images captured and stored on drive  Diagnosis / Impression:  OD: Interval redevelopment of central edema/IRF, +ERM, bullous schisis IT quad caught on widefield OS: Persistent central cystic changes--slightly improved, persistent ERM with PRF  Clinical management:  See below  Abbreviations: NFP - Normal foveal profile. CME - cystoid macular edema. PED - pigment epithelial detachment. IRF - intraretinal fluid. SRF - subretinal fluid. EZ - ellipsoid zone. ERM - epiretinal membrane. ORA - outer retinal atrophy. ORT - outer retinal tubulation. SRHM - subretinal hyper-reflective material      Intravitreal Injection, Pharmacologic Agent - OD - Right Eye       Time Out 11/03/2023. 11:26 AM. Confirmed correct patient, procedure, site, and patient consented.   Anesthesia  Topical anesthesia was used. Anesthetic medications included Lidocaine  2%, Proparacaine 0.5%.   Procedure Preparation included 5% betadine to ocular surface, eyelid speculum. A (32g) needle was used.   Injection: 2 mg  aflibercept  2 MG/0.05ML   Route: Intravitreal, Site: Right Eye   NDC: D2246706, Lot: [id number], Expiration date: 11/02/2024, Waste: 0 mL   Post-op Post injection exam found visual acuity of at least counting fingers. The patient tolerated the procedure well. There were no complications. The patient received written and verbal post procedure care education. Post injection medications were not given.      Intravitreal Injection, Pharmacologic Agent - OS - Left Eye       Time Out 11/03/2023. 11:26 AM. Confirmed correct patient, procedure, site, and patient consented.   Anesthesia Topical anesthesia was used. Anesthetic medications included Lidocaine  2%, Proparacaine 0.5%.   Procedure Preparation included 5% betadine to ocular surface, eyelid speculum. A supplied (32g) needle was used.   Injection: 6 mg faricimab -svoa 6 MG/0.05ML   Route: Intravitreal, Site: Left Eye   NDC: 49757-903-93, Lot: [id number], Expiration date: 10/02/2024, Waste: 0 mL   Post-op Post injection exam found visual acuity of at least counting fingers. The patient tolerated the procedure well. There were no complications. The patient received written and verbal post procedure care education. Post injection medications were not given.            ASSESSMENT/PLAN:    ICD-10-CM   1. Exudative age-related macular degeneration of both eyes with active choroidal neovascularization (HCC)  H35.3231 OCT, Retina - OU - Both Eyes    Intravitreal Injection, Pharmacologic Agent - OD - Right Eye    Intravitreal Injection, Pharmacologic Agent - OS - Left Eye    faricimab -svoa (VABYSMO ) 6mg /0.49mL intravitreal injection    aflibercept  (EYLEA ) SOLN 2 mg    2. Subretinal hemorrhage of both eyes  H35.63     3. Serous retinal detachment of left eye  H33.22     4. 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 Essential hypertension  I10     7. Hypertensive retinopathy of  both eyes  H35.033     8. Pseudophakia of both eyes  Z96.1      1-3. Exudative age related macular degeneration / PECHR OU - interval conversion to exu ARMD OD noted on 11.18.22 exam -- new SRH and exudates IT periphery  - initially presented with symptomatic floaters OS - initial exam showed large peripheral temporal subretinal hemorrhage OS -- spanning 2-430 -- also mild vitreous opacities and condensations that were causing symptomatic floaters  - B-scan (9.18.20) showed hyperreflective, ill-defined mass, 0300 periphery OS - was seen by Dr. Beatris, Ocular Oncologist, on 9.22.2020 -- no tumor on exam or b-scan  - s/p IVA OS #1 (08.05.20) for subretinal hemorrhage, peripheral CNV - s/p IVA OD #1 (11.18.22), #2 (12.16.22), #3 (01.13.23), #4 (02.10.23), #5 (03.24.23) ======================== - s/p IVE OD #1 (05.08.23), #2 (06.21.23), #3 (08.16.23), #4 (02.27.24), #5 (06.05.24), #6 (10.10.24), #7 (01.08.25), #8 (02.26.25), #9 (06.18.25) - s/p IVE OS #1 (09.02.20) - sample, #2 (10.12.20), #3 (11.11.20), #4 (12.09.20), #5 (1.20.21), #6 (02.17.21), #7 (03.19.21), #8 (04.30.21), #9 (06.16.21), #10 (08.11.21), #11 (12.01.21), #12 (01.26.22), #13 (03.30.22), #14 (06.01.22), #15 (08.10.22), #16 (09.23.22), #17 (11.18.22), #18 (02.10.23), #19 (03.24.23), #20 (05.08.23), #21 (06.21.23), #22 (08.16.23), #23 (10.25.23), #24 (12.20.23), #25 (02.27.24) ==========================  - s/p IVV OS #1 (04.24.24), #2 (06.05.24), #3 (07.17.24), #4 (08.28.24), #5 (  10.10.24), #6 (11.20.24), #7 (01.08.25), #8 (02.26.25), #9 (04.23.25), #10 (06.18.25) #11(08.29.25)  ==========================  **h/o increased IRF OD at 18 weeks on 10.31.25**  - today BCVA OD 20/50 from 20/40, OS 20/80 from 20/60  - exam shows peripheral SRH stably improved OU - OCT shows OD: Interval redevelopment of central edema/IRF, +ERM, bullous schisis IT quad caught on widefield; OS: Persistent central cystic changes--slightly improved, persistent  ERM with PRF at 9 weeks  - recommend IVE OD #10 and IVV OS #12 today, 10.31.25 w/ f/u in 9 wks  - pt in agreement and wishes to proceed with injection OS  - RBA of procedure discussed, questions answered   - see procedure note  - IVV informed consent obtained and signed 04.24.24 (OS)  - Pt okay for all treatments / meds, payable 100% (Medicare and Tricare) - f/u 9 weeks, sooner prn, OCT -- scan through IT schisis OU, DFE, possible injection(s)  4. Epiretinal membrane, OU.  - mild ERM OU  - asymptomatic, no metamorphopsia  - no indication for surgery at this time  - continue to monitor  5. Retinoschisis OD -- stable - schisis cavity, IT quadrant, from 0700-0830, -- ?less posterior extension ?collapsing  - no associated RT/RD but now with [facility] and exudates -- improving   - discussed findings and prognosis  - continue to monitor  6,7. Hypertensive retinopathy OU  - discussed importance of tight BP control  - continue to monitor  8. Pseudophakia OU  - s/p CE/IOL OU  - doing well  - continue to monitor  Ophthalmic Meds Ordered this visit:  Meds ordered this encounter  Medications   faricimab -svoa (VABYSMO ) 6mg /0.2mL intravitreal injection   aflibercept  (EYLEA ) SOLN 2 mg     Return in about 9 weeks (around 01/05/2024) for exu ARMD OU, DFE, OCT, Possible Injxn.  There are no Patient Instructions on file for this visit.   This document serves as a record of services personally performed by Redell JUDITHANN Hans, MD, PhD. It was created on their behalf by Delon Newness COT, an ophthalmic technician. The creation of this record is the provider's dictation and/or activities during the visit.    Electronically signed by: Delon Newness COT 10.30.25  12:16 PM  Redell JUDITHANN Hans, M.D., Ph.D. Diseases & Surgery of the Retina and Vitreous [facility] 11/03/2023   I have reviewed the above documentation for accuracy and completeness, and I agree with the  above. Redell JUDITHANN Hans, M.D., Ph.D. 11/03/23 12:16 PM   Abbreviations: M myopia (nearsighted); A astigmatism; H hyperopia (farsighted); P presbyopia; Mrx spectacle prescription;  CTL contact lenses; OD right eye; OS left eye; OU both eyes  XT exotropia; ET esotropia; PEK punctate epithelial keratitis; PEE punctate epithelial erosions; DES dry eye syndrome; MGD meibomian gland dysfunction; ATs artificial tears; PFAT's preservative free artificial tears; NSC nuclear sclerotic cataract; PSC posterior subcapsular cataract; ERM epi-retinal membrane; PVD posterior vitreous detachment; RD retinal detachment; DM diabetes mellitus; DR diabetic retinopathy; NPDR non-proliferative diabetic retinopathy; PDR proliferative diabetic retinopathy; CSME clinically significant macular edema; DME diabetic macular edema; dbh dot blot hemorrhages; CWS cotton wool spot; POAG primary open angle glaucoma; C/D cup-to-disc ratio; HVF humphrey visual field; GVF goldmann visual field; OCT optical coherence tomography; IOP intraocular pressure; BRVO Branch retinal vein occlusion; CRVO central retinal vein occlusion; CRAO central retinal artery occlusion; BRAO branch retinal artery occlusion; RT retinal tear; SB scleral buckle; PPV pars plana vitrectomy; VH Vitreous hemorrhage; PRP panretinal laser photocoagulation; IVK intravitreal kenalog;  VMT vitreomacular traction; MH Macular hole;  NVD neovascularization of the disc; NVE neovascularization elsewhere; AREDS age related eye disease study; ARMD age related macular degeneration; POAG primary open angle glaucoma; EBMD epithelial/anterior basement membrane dystrophy; ACIOL anterior chamber intraocular lens; IOL intraocular lens; PCIOL posterior chamber intraocular lens; Phaco/IOL phacoemulsification with intraocular lens placement; PRK photorefractive keratectomy; LASIK laser assisted in situ keratomileusis; HTN hypertension; DM diabetes mellitus; COPD chronic obstructive pulmonary disease

## 2023-11-03 ENCOUNTER — Encounter (INDEPENDENT_AMBULATORY_CARE_PROVIDER_SITE_OTHER): Payer: Self-pay | Admitting: Ophthalmology

## 2023-11-03 ENCOUNTER — Ambulatory Visit (INDEPENDENT_AMBULATORY_CARE_PROVIDER_SITE_OTHER): Admitting: Ophthalmology

## 2023-11-03 DIAGNOSIS — I10 Essential (primary) hypertension: Secondary | ICD-10-CM

## 2023-11-03 DIAGNOSIS — Z961 Presence of intraocular lens: Secondary | ICD-10-CM

## 2023-11-03 DIAGNOSIS — H353231 Exudative age-related macular degeneration, bilateral, with active choroidal neovascularization: Secondary | ICD-10-CM

## 2023-11-03 DIAGNOSIS — H35373 Puckering of macula, bilateral: Secondary | ICD-10-CM | POA: Diagnosis not present

## 2023-11-03 DIAGNOSIS — H33101 Unspecified retinoschisis, right eye: Secondary | ICD-10-CM

## 2023-11-03 DIAGNOSIS — H3563 Retinal hemorrhage, bilateral: Secondary | ICD-10-CM | POA: Diagnosis not present

## 2023-11-03 DIAGNOSIS — H3322 Serous retinal detachment, left eye: Secondary | ICD-10-CM | POA: Diagnosis not present

## 2023-11-03 DIAGNOSIS — H35033 Hypertensive retinopathy, bilateral: Secondary | ICD-10-CM

## 2023-11-03 MED ORDER — FARICIMAB-SVOA 6 MG/0.05ML IZ SOSY
6.0000 mg | PREFILLED_SYRINGE | INTRAVITREAL | Status: AC | PRN
  Administered 2023-11-03: 6 mg via INTRAVITREAL

## 2023-11-03 MED ORDER — AFLIBERCEPT 2MG/0.05ML IZ SOLN FOR KALEIDOSCOPE
2.0000 mg | INTRAVITREAL | Status: AC | PRN
  Administered 2023-11-03: 2 mg via INTRAVITREAL

## 2023-11-27 ENCOUNTER — Other Ambulatory Visit (HOSPITAL_COMMUNITY): Payer: Self-pay

## 2023-12-29 ENCOUNTER — Emergency Department (HOSPITAL_COMMUNITY)

## 2023-12-29 ENCOUNTER — Emergency Department (HOSPITAL_COMMUNITY)
Admission: EM | Admit: 2023-12-29 | Discharge: 2023-12-29 | Disposition: A | Discharge status: Home / Self Care | Source: Skilled Nursing Facility | Attending: Emergency Medicine | Admitting: Emergency Medicine

## 2023-12-29 ENCOUNTER — Other Ambulatory Visit: Payer: Self-pay

## 2023-12-29 DIAGNOSIS — W19XXXA Unspecified fall, initial encounter: Secondary | ICD-10-CM

## 2023-12-29 DIAGNOSIS — S0990XA Unspecified injury of head, initial encounter: Secondary | ICD-10-CM | POA: Insufficient documentation

## 2023-12-29 DIAGNOSIS — F0390 Unspecified dementia without behavioral disturbance: Secondary | ICD-10-CM | POA: Insufficient documentation

## 2023-12-29 DIAGNOSIS — W01198A Fall on same level from slipping, tripping and stumbling with subsequent striking against other object, initial encounter: Secondary | ICD-10-CM | POA: Insufficient documentation

## 2023-12-29 LAB — CBC WITH DIFFERENTIAL/PLATELET
Abs Immature Granulocytes: 0.02 K/uL (ref 0.00–0.07)
Basophils Absolute: 0 K/uL (ref 0.0–0.1)
Basophils Relative: 0 %
Eosinophils Absolute: 0.1 K/uL (ref 0.0–0.5)
Eosinophils Relative: 1 %
HCT: 39.8 % (ref 36.0–46.0)
Hemoglobin: 12.9 g/dL (ref 12.0–15.0)
Immature Granulocytes: 0 %
Lymphocytes Relative: 12 %
Lymphs Abs: 1 K/uL (ref 0.7–4.0)
MCH: 29.6 pg (ref 26.0–34.0)
MCHC: 32.4 g/dL (ref 30.0–36.0)
MCV: 91.3 fL (ref 80.0–100.0)
Monocytes Absolute: 0.6 K/uL (ref 0.1–1.0)
Monocytes Relative: 7 %
Neutro Abs: 6.9 K/uL (ref 1.7–7.7)
Neutrophils Relative %: 80 %
Platelets: 229 K/uL (ref 150–400)
RBC: 4.36 MIL/uL (ref 3.87–5.11)
RDW: 13 % (ref 11.5–15.5)
WBC: 8.6 K/uL (ref 4.0–10.5)
nRBC: 0 % (ref 0.0–0.2)

## 2023-12-29 LAB — COMPREHENSIVE METABOLIC PANEL WITH GFR
ALT: 11 U/L (ref 0–44)
AST: 21 U/L (ref 15–41)
Albumin: 4.3 g/dL (ref 3.5–5.0)
Alkaline Phosphatase: 67 U/L (ref 38–126)
Anion gap: 11 (ref 5–15)
BUN: 18 mg/dL (ref 8–23)
CO2: 25 mmol/L (ref 22–32)
Calcium: 9.8 mg/dL (ref 8.9–10.3)
Chloride: 104 mmol/L (ref 98–111)
Creatinine, Ser: 1.25 mg/dL — ABNORMAL HIGH (ref 0.44–1.00)
GFR, Estimated: 39 mL/min — ABNORMAL LOW
Glucose, Bld: 93 mg/dL (ref 70–99)
Potassium: 4.4 mmol/L (ref 3.5–5.1)
Sodium: 140 mmol/L (ref 135–145)
Total Bilirubin: 0.4 mg/dL (ref 0.0–1.2)
Total Protein: 7.6 g/dL (ref 6.5–8.1)

## 2023-12-29 LAB — TROPONIN T, HIGH SENSITIVITY
Troponin T High Sensitivity: 29 ng/L — ABNORMAL HIGH (ref 0–19)
Troponin T High Sensitivity: 26 ng/L — ABNORMAL HIGH (ref 0–19)

## 2023-12-29 NOTE — Discharge Instructions (Signed)
 The test today did not show any signs of serious injury.  Follow-up with your cardiologist to be rechecked as we discussed.  Return to the ED for any recurrent episodes.

## 2023-12-29 NOTE — ED Provider Notes (Signed)
 " [place] EMERGENCY DEPARTMENT AT [facility] Provider Note   CSN: [id number] Arrival date & time: 12/29/23  9098     Patient presents with: Tracey Rosario is a 89 y.o. female.    Fall     Patient presents to the ED for evaluation after a fall.  Patient has a history of dementia, atrial fibrillation, bradycardia, syncope.  Patient states she was walking in the bathroom getting ready to get breakfast.  Patient states she then had a syncopal episode and fell to the ground.  She did hit her head and has mild tenderness on the right forehead but otherwise denies any injuries or pain associated with the episode.  She denies any warning of having fevers chills or weakness.  EMS reported that nursing staff told them patient had a fall did not lose consciousness.  Not clear from the EMS report that patient had a witnessed fall.  Patient states she was by herself when she fell.  Patient has had similar episodes in the past and has been admitted for syncope.  She has also had long-term heart monitor placed.  She has followed up with cardiology and initial plans were for no anticoagulation because of her recurrent falls  Prior to Admission medications  Medication Sig Start Date End Date Taking? Authorizing Provider  albuterol  (VENTOLIN  HFA) 108 (90 Base) MCG/ACT inhaler Inhale 2 puffs into the lungs every 6 (six) hours as needed for wheezing or shortness of breath. Patient not taking: Reported on 10/20/2023 12/25/21   Singh, Prashant K, MD  amLODipine  (NORVASC ) 5 MG tablet Take 5 mg by mouth daily.    [provider]  Artificial Tear Solution (GENTEAL TEARS OP) Place 1 drop into both eyes 3 (three) times daily as needed (dry eyes).    [provider]  Cholecalciferol  (VITAMIN D3) 125 MCG (5000 UT) CAPS Take 1 capsule by mouth daily.    [provider]  citalopram  (CELEXA ) 20 MG tablet Take 20 mg by mouth daily. 04/29/19   [provider]  donepezil  (ARICEPT ) 23 MG TABS tablet Take 23 mg by mouth at bedtime. 11/22/21   [provider]  ferrous sulfate  (FEROSUL) 325 (65 FE) MG tablet Take 325 mg by mouth daily.    [provider]  furosemide  (LASIX ) 20 MG tablet Take 10 mg by mouth daily.    [provider]  gabapentin  (NEURONTIN ) 300 MG capsule Take 300 mg by mouth 3 (three) times daily as needed (for neuropathic pain). Patient not taking: Reported on 10/20/2023 06/05/20   [provider]  Lactobacillus Rhamnosus, GG, (CULTURELLE) CAPS Take 1 capsule by mouth daily.    [provider]  levothyroxine  (SYNTHROID ) 75 MCG tablet Take 75 mcg by mouth daily before breakfast. 03/04/20   [provider]  lisinopril  (ZESTRIL ) 5 MG tablet Take 5 mg by mouth daily.    [provider]  loperamide (IMODIUM A-D) 2 MG tablet Take 2 mg by mouth every 6 (six) hours as needed for diarrhea or loose stools.    [provider]  memantine  (NAMENDA ) 5 MG tablet TAKE 1 TABLET BY MOUTH EVERYDAY AT BEDTIME 12/10/21   Wertman, Sara E, PA-C  mirtazapine  (REMERON ) 7.5 MG tablet Take 7.5 mg by mouth at bedtime.    [provider]  Nystatin (GERHARDT'S BUTT CREAM) CREA Apply 1 Application topically daily. 01/14/22   Pokhrel, Laxman, MD  [Paused] oxybutynin  (DITROPAN -XL) 10 MG 24 hr tablet Take 10 mg  by mouth daily. Patient not taking: Reported on 10/20/2023 Wait to take this until your doctor or other care provider tells you to start again. 10/25/21   [provider]  pantoprazole  (PROTONIX ) 40 MG tablet Take 1 tablet (40 mg total) by mouth daily. 08/26/23   Amilibia, Jaden, DO  Polyethyl Glycol-Propyl Glycol (SYSTANE ULTRA OP) Place 1 drop into both eyes 3 (three) times daily.    [provider]  Zinc Oxide (BAZA PROTECT MOISTURE BARRIER) 12 % CREA Apply 1 application  topically daily.    [provider]    Allergies: Codeine    Review of  Systems  Updated Vital Signs BP (!) 151/61 (BP Location: Right Arm)   Pulse (!) 51   Temp (!) 97.5 F (36.4 C) (Oral)   Resp 18   SpO2 92%   Physical Exam Vitals and nursing note reviewed.  Constitutional:      General: She is not in acute distress.    Appearance: She is well-developed.  HENT:     Head: Normocephalic.     Comments: Small bruising noted right forehead    Right Ear: External ear normal.     Left Ear: External ear normal.  Eyes:     General: No scleral icterus.       Right eye: No discharge.        Left eye: No discharge.     Conjunctiva/sclera: Conjunctivae normal.  Neck:     Trachea: No tracheal deviation.  Cardiovascular:     Rate and Rhythm: Normal rate and regular rhythm.  Pulmonary:     Effort: Pulmonary effort is normal. No respiratory distress.     Breath sounds: Normal breath sounds. No stridor. No wheezing or rales.  Abdominal:     General: Bowel sounds are normal. There is no distension.     Palpations: Abdomen is soft.     Tenderness: There is no abdominal tenderness. There is no guarding or rebound.  Musculoskeletal:        General: No tenderness or deformity.     Cervical back: Neck supple.     Comments: No spinal tenderness, no tenderness palpation bilateral upper extremities lower extremities  Skin:    General: Skin is warm and dry.     Findings: No rash.  Neurological:     General: No focal deficit present.     Mental Status: She is alert.     Cranial Nerves: No cranial nerve deficit, dysarthria or facial asymmetry.     Sensory: No sensory deficit.     Motor: No weakness, abnormal muscle tone or seizure activity.     Coordination: Coordination normal.     Comments: Covid lower extremities, no facial droop, normal sensation  Psychiatric:        Mood and Affect: Mood normal.     (all labs ordered are listed, but only abnormal results are displayed) Labs Reviewed  COMPREHENSIVE METABOLIC PANEL WITH GFR - Abnormal; Notable for the  following components:      Result Value   Creatinine, Ser 1.25 (*)    GFR, Estimated 39 (*)    All other components within normal limits  TROPONIN T, HIGH SENSITIVITY - Abnormal; Notable for the following components:   Troponin T High Sensitivity 29 (*)    All other components within normal limits  TROPONIN T, HIGH SENSITIVITY - Abnormal; Notable for the following components:   Troponin T High Sensitivity 26 (*)    All other components within normal limits  CBC WITH DIFFERENTIAL/PLATELET  CBG MONITORING, ED    EKG: EKG Interpretation Date/Time:  Friday December 29 2023 09:21:10 EST Ventricular Rate:  53 PR Interval:    QRS Duration:  100 QT Interval:  480 QTC Calculation: 451 R Axis:   60  Text Interpretation: Sinus rhythm Abnormal R-wave progression, early transition Confirmed by Randol Simmonds [phone]) on 12/29/2023 9:28:09 AM  Radiology: [facility] Chest 2 View Result Date: 12/29/2023 EXAM: 2 VIEW(S) XRAY OF THE CHEST 12/29/2023 10:51:00 AM COMPARISON: 08/22/2023 CLINICAL HISTORY: 89 year old female with syncope and a fall in the bathroom. FINDINGS: LUNGS AND PLEURA: Confluent calcified apical lung and pleural scarring is stable. No pleural effusion. No pneumothorax. HEART AND MEDIASTINUM: Atherosclerotic plaque noted. No acute abnormality of the cardiac and mediastinal silhouettes. BONES AND SOFT TISSUES: Osteopenia. No acute osseous abnormality. IMPRESSION: 1. No acute cardiopulmonary abnormality. Electronically signed by: Helayne Hurst MD 12/29/2023 11:24 AM EST RP Workstation: [id number]   CT Cervical Spine Wo Contrast Result Date: 12/29/2023 EXAM: CT CERVICAL SPINE WITHOUT CONTRAST 12/29/2023 09:38:32 AM TECHNIQUE: CT of the cervical spine was performed without the administration of intravenous contrast. Multiplanar reformatted images are provided for review. Automated exposure control, iterative reconstruction, and/or weight based adjustment of the mA/kV was utilized to reduce the  radiation dose to as low as reasonably achievable. COMPARISON: CT head reported separately 12/29/2023. Cervical spine CT 08/22/2023. CLINICAL HISTORY: 89 year old female. Fall this morning. Neck trauma (Age >= 65y). FINDINGS: BONES AND ALIGNMENT: Stable cervical lordosis. No acute fracture or traumatic malalignment. DEGENERATIVE CHANGES: Chronic cervical spine degeneration, age appropriate at most levels. Chronic severe disc and endplate degeneration at C6-C7, including vacuum disc, appears stable. Partially calcified chronic degenerative ligamentous hypertrophy about the odontoid. SOFT TISSUES: Negative non-contrast neck soft tissues. No prevertebral soft tissue swelling. LUNGS: Partially calcified apical lung scarring incidentally noted. IMPRESSION: 1. No acute traumatic injury identified in the cervical spine. 2. Stable chronic degeneration. Electronically signed by: Helayne Hurst MD 12/29/2023 09:45 AM EST RP Workstation: [id number]   CT Head Wo Contrast Result Date: 12/29/2023 EXAM: CT HEAD WITHOUT CONTRAST 12/29/2023 09:38:32 AM TECHNIQUE: CT of the head was performed without the administration of intravenous contrast. Automated exposure control, iterative reconstruction, and/or weight-based adjustment of the mA/kV was utilized to reduce the radiation dose to as low as reasonably achievable. COMPARISON: Comparison exams include brain MRI on 04/15/2015 and head CT on 8/19 of this year. CLINICAL HISTORY: 89 year old female, fall head trauma. 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 Patchy and confluent bilateral cerebral white matter hypodensity, stable. No acute hemorrhage. No evidence of acute infarct. No hydrocephalus. No extra-axial collection. No mass effect or midline shift. ORBITS: No acute abnormality. SINUSES: Visible paranasal sinuses, tympanic cavities, and mastoids are well aerated. No acute abnormality. SOFT  TISSUES AND SKULL: Chronic scalp soft tissue scarring along the right vertex. No acute orbital or scalp soft tissue injury identified. Calcified atherosclerosis at the skull base. Chronic TMJ degeneration. IMPRESSION: 1. No acute traumatic injury or intracranial abnormality identified. Electronically signed by: Helayne Hurst MD 12/29/2023 09:42 AM EST RP Workstation: [id number]     Procedures   Medications Ordered in the ED - No data to display  Clinical Course as of 12/29/23 1156  Fri Dec 29, 2023  1013 CBC WITH DIFFERENTIAL CBC normal.  Metabolic panel shows creatinine increased at 1.25.  Troponin increased to 29 [JK]  1014 Head CT and C-spine CT without acute injury [  JK]  1148 Second troponin unchanged [JK]    Clinical Course User Index [JK] Randol Simmonds, MD                                 Medical Decision Making Problems Addressed: Fall, initial encounter: acute illness or injury that poses a threat to life or bodily functions Injury of head, initial encounter: acute illness or injury that poses a threat to life or bodily functions  Amount and/or Complexity of Data Reviewed Labs: ordered. Decision-making details documented in ED Course. Radiology: ordered and independent interpretation performed.   Patient presented to the ED for evaluation of a fall.  Unclear if the patient had a syncopal episode versus mechanical fall.  She does however have a history of syncope as well as falls.  Patient noted to have mild contusion forehead.  Fortunately no signs of serious injury on her CT scans.  Patient does not show any signs of acute infection.  She has not been hypotensive.  Mild bradycardia noted but doubt he is asymptomatic.  Patient has had prior cardiac workup for syncope.  She has seen cardiology as an outpatient.  Discussed findings with the daughter.  Troponin initially was slightly elevated but second is unchanged.  Doubt this suggest acute cardiac ischemia.  It is possible  patient may have had some type of bradycardic episode.  Daughter is not interested in aggressive measures at this time.  Patient does appear stable to follow-up with her cardiologist as an outpatient     Final diagnoses:  Fall, initial encounter  Injury of head, initial encounter    ED Discharge Orders     None          Randol Simmonds, MD 12/29/23 1158  "

## 2023-12-29 NOTE — ED Notes (Signed)
 Pt daughter called and educated on DC instructions she reports she will be taking the pt back to [facility], pt will wait in room until she arrives

## 2023-12-29 NOTE — ED Triage Notes (Signed)
 PT BIB EMS from [facility] hx of dementia, afib, bradycardia, was standing in bathroom, got dizziness and fell, no LOC, pt hit head on right side, contusion noted with bruising, no thinners, denies pain anywhere else, pt DNR EMS VS  122/60 50 hr  97 RA CBG 116

## 2024-01-05 ENCOUNTER — Encounter (INDEPENDENT_AMBULATORY_CARE_PROVIDER_SITE_OTHER): Admitting: Ophthalmology

## 2024-01-05 NOTE — Progress Notes (Signed)
 " [facility] - Clinic Note  01/12/2024     CHIEF COMPLAINT Patient presents for Retina Follow Up  HISTORY OF PRESENT ILLNESS: Tracey Rosario is a 89 y.o. female who presents to the clinic today for:   HPI     Retina Follow Up   Patient presents with  Wet AMD.  In both eyes.  This started 5 years ago.  Severity is moderate.  Duration of 10 weeks.  Since onset it is stable.  I, the attending physician,  performed the HPI with the patient and updated documentation appropriately.        Comments   Pt states she has been seeing a lot. Pt had floaters for a day but they are not present today. Pt denies FOL/pain. Ptis using Systane 2-3 times per day.      Last edited by Valdemar Rogue, MD on 01/14/2024  8:14 PM.     Pt states she cannot tell a difference in vision.   Referring physician: No referring provider defined for this encounter.  HISTORICAL INFORMATION:   Selected notes from the MEDICAL RECORD NUMBER Referred by Dr. Medford Ferrier for retinal eval   CURRENT MEDICATIONS: Current Outpatient Medications (Ophthalmic Drugs)  Medication Sig   Artificial Tear Solution (GENTEAL TEARS OP) Place 1 drop into both eyes 3 (three) times daily as needed (dry eyes).   Polyethyl Glycol-Propyl Glycol (SYSTANE ULTRA OP) Place 1 drop into both eyes 3 (three) times daily.   No current facility-administered medications for this visit. (Ophthalmic Drugs)   Current Outpatient Medications (Other)  Medication Sig   albuterol  (VENTOLIN  HFA) 108 (90 Base) MCG/ACT inhaler Inhale 2 puffs into the lungs every 6 (six) hours as needed for wheezing or shortness of breath. (Patient not taking: Reported on 10/20/2023)   amLODipine  (NORVASC ) 5 MG tablet Take 5 mg by mouth daily.   Cholecalciferol  (VITAMIN D3) 125 MCG (5000 UT) CAPS Take 1 capsule by mouth daily.   citalopram  (CELEXA ) 20 MG tablet Take 20 mg by mouth daily.   donepezil  (ARICEPT ) 23 MG TABS tablet Take 23 mg by  mouth at bedtime.   ferrous sulfate  (FEROSUL) 325 (65 FE) MG tablet Take 325 mg by mouth daily.   furosemide  (LASIX ) 20 MG tablet Take 10 mg by mouth daily.   gabapentin  (NEURONTIN ) 300 MG capsule Take 300 mg by mouth 3 (three) times daily as needed (for neuropathic pain). (Patient not taking: Reported on 10/20/2023)   Lactobacillus Rhamnosus, GG, (CULTURELLE) CAPS Take 1 capsule by mouth daily.   levothyroxine  (SYNTHROID ) 75 MCG tablet Take 75 mcg by mouth daily before breakfast.   lisinopril  (ZESTRIL ) 5 MG tablet Take 5 mg by mouth daily.   loperamide (IMODIUM A-D) 2 MG tablet Take 2 mg by mouth every 6 (six) hours as needed for diarrhea or loose stools.   memantine  (NAMENDA ) 5 MG tablet TAKE 1 TABLET BY MOUTH EVERYDAY AT BEDTIME   mirtazapine  (REMERON ) 7.5 MG tablet Take 7.5 mg by mouth at bedtime.   Nystatin (GERHARDT'S BUTT CREAM) CREA Apply 1 Application topically daily.   [Paused] oxybutynin  (DITROPAN -XL) 10 MG 24 hr tablet Take 10 mg by mouth daily. (Patient not taking: Reported on 10/20/2023)   pantoprazole  (PROTONIX ) 40 MG tablet Take 1 tablet (40 mg total) by mouth daily.   Zinc Oxide (BAZA PROTECT MOISTURE BARRIER) 12 % CREA Apply 1 application  topically daily.   No current facility-administered medications for this visit. (Other)   REVIEW OF SYSTEMS: ROS  Positive for: Gastrointestinal, Neurological, Genitourinary, Musculoskeletal, Cardiovascular, Eyes Negative for: Constitutional, Skin, HENT, Endocrine, Respiratory, Psychiatric, Allergic/Imm, Heme/Lymph Last edited by Elnor Avelina RAMAN, COT on 01/12/2024  9:46 AM.      ALLERGIES Allergies  Allergen Reactions   Codeine Other (See Comments)    Altered mental status   PAST MEDICAL HISTORY Past Medical History:  Diagnosis Date   Acid reflux    Anemia    Arthropathy    Bradycardia    beta blocker reduced in 2021 due to this   CKD (chronic kidney disease), stage III (HCC)    Cognitive decline    referred to neurologist    Depression 10/07/2013   daughter didn't know anything about this   Dizziness    Frequent PVCs    GERD (gastroesophageal reflux disease) 05/04/2013   Hip fx (HCC)    r hip, bilat pubis rami   Hyperlipemia    Hypertension    Hypertensive retinopathy    OU   Hypothyroidism    Insomnia    Macular degeneration    Exu OS   Multilevel degenerative disc disease    Palpitations 05/04/2013   saw cardiologist in 2015, he deleted A. Fib from the record as per note not documentation to support   Premature atrial contractions    PSVT (paroxysmal supraventricular tachycardia)    a. short runs by monitor in 2021.   Short-term memory loss    Vitamin D  deficiency    Past Surgical History:  Procedure Laterality Date   ABDOMINAL HYSTERECTOMY  1971   BLADDER SURGERY  2010   Broken Pelvis  2015   C-EYE SURGERY PROCEDURE Bilateral    zaldivar   CATARACT EXTRACTION Bilateral    Done in [place]    EYE SURGERY Bilateral    Cat Sx   HIP PINNING,CANNULATED Right 10/07/2013   Procedure: CANNULATED screws right hip;  Surgeon: Lonni CINDERELLA Poli, MD;  Location: [facility] OR;  Service: Orthopedics;  Laterality: Right;   FAMILY HISTORY Family History  Problem Relation Age of Onset   Prostate cancer Father    Heart attack Sister    Cancer Brother    Cancer Sister    Heart attack Sister    Cancer Sister    Stroke Daughter    Dementia Neg Hx    Alzheimer's disease Neg Hx    SOCIAL HISTORY Social History   Tobacco Use   Smoking status: Former    Current packs/day: 0.00    Types: Cigarettes    Quit date: 08/02/1970    Years since quitting: 53.4   Smokeless tobacco: Never   Tobacco comments:    smoked very little  Vaping Use   Vaping status: Never Used  Substance Use Topics   Alcohol  use: Never    Alcohol /week: 0.0 standard drinks of alcohol    Drug use: Never       OPHTHALMIC EXAM:  Base Eye Exam     Visual Acuity (Snellen - Linear)       Right Left   Dist [place] 20/50 20/150 +2    Dist ph [place] 20/30 -1 NI         Tonometry (Tonopen, 9:55 AM)       Right Left   Pressure 14 10         Pupils       Pupils Dark Light Shape React APD   Right PERRL 2 2 Round Minimal None   Left PERRL 2 2 Round Minimal None  Visual Fields       Left Right    Full Full         Extraocular Movement       Right Left    Full, Ortho Full, Ortho         Neuro/Psych     Oriented x3: Yes   Mood/Affect: Normal         Dilation     Both eyes: 1.0% Mydriacyl, 2.5% Phenylephrine @ 9:56 AM           Slit Lamp and Fundus Exam     Slit Lamp Exam       Right Left   Lids/Lashes Dermatochalasis - upper lid Dermatochalasis - upper lid   Conjunctiva/Sclera White and quiet White and quiet   Cornea Arcus, 1+ Punctate epithelial erosions Arcus, 2-3+ Punctate epithelial erosions, EBMD, irregular epi   Anterior Chamber deep and clear deep and clear   Iris Round and moderately dilated Round and moderately dilated to 5mm, mild iridodonesis   Lens PC IOL in good position with open PC, anterior capsular phimosis PC IOL in good position with open PC   Anterior Vitreous Vitreous syneresis, Posterior vitreous detachment Vitreous syneresis; PVD; vitreous condensations         Fundus Exam       Right Left   Disc Mild temporal Pallor, Peripapillary atrophy, Sharp rim, Compact mild tilt, 2+ temporal Pallor, Peripapillary atrophy, Compact, Sharp rim   C/D Ratio 0.1 0.0   Macula Flat, Blunted foveal reflex, mild Epiretinal membrane, drusen, RPE mottling and clumping, central cystic changes-improved, no heme Flat, Blunted foveal reflex, RPE mottling and clumping, Epiretinal membrane, central cystic changes -- increased, No heme   Vessels attenuated, mild tortuosity attenuated, Tortuous   Periphery IT schisis cavity from 0700-0830; otherwise attached; stable improvement in [facility] and exudation under schisis cavities [facility] not red and pigmenting in), exudation resolved, focal  pigmented CR scar at 0630; no red heme temporal sub-retinal hemorrhage improved - now pigmented CR atrophy;  No SRF -- completely resolved, shallow schisis IT quad, No heme           Refraction     Manifest Refraction (Subjective)       Sphere Cylinder Dist VA   Right      Left -0.50 Sphere NI            IMAGING AND PROCEDURES  Imaging and Procedures for @TODAY @  OCT, Retina - OU - Both Eyes       Right Eye Quality was good. Central Foveal Thickness: 333. Progression has improved. Findings include normal foveal contour, no IRF, no SRF, retinal drusen , epiretinal membrane, macular pucker, pigment epithelial detachment (Interval improvement in central edema/IRF, drusen, +ERM, bullous schisis IT quad--not imaged today).   Left Eye Quality was good. Central Foveal Thickness: 560. Progression has worsened. Findings include normal foveal contour, no SRF, abnormal foveal contour, retinal drusen , cystoid macular edema, epiretinal membrane, intraretinal fluid, preretinal fibrosis (Persistent central cystic changes--increased, persistent ERM with PRF).   Notes *Images captured and stored on drive  Diagnosis / Impression:  OD: Interval improvement in central edema/IRF, drusen, +ERM, bullous schisis IT quad--not imaged today OS: Persistent central cystic changes--increased, persistent ERM with PRF  Clinical management:  See below  Abbreviations: NFP - Normal foveal profile. CME - cystoid macular edema. PED - pigment epithelial detachment. IRF - intraretinal fluid. SRF - subretinal fluid. EZ - ellipsoid zone. ERM - epiretinal membrane. ORA - outer retinal atrophy.  ORT - outer retinal tubulation. SRHM - subretinal hyper-reflective material      Intravitreal Injection, Pharmacologic Agent - OD - Right Eye       Time Out 01/12/2024. 11:36 AM. Confirmed correct patient, procedure, site, and patient consented.   Anesthesia Topical anesthesia was used. Anesthetic medications  included Lidocaine  2%, Proparacaine 0.5%.   Procedure Preparation included 5% betadine to ocular surface, eyelid speculum. A (32g) needle was used.   Injection: 2 mg aflibercept  2 MG/0.05ML   Route: Intravitreal, Site: Right Eye   NDC: D2246706, Lot: [id number], Expiration date: 02/01/2025, Waste: 0 mL   Post-op Post injection exam found visual acuity of at least counting fingers. The patient tolerated the procedure well. There were no complications. The patient received written and verbal post procedure care education. Post injection medications were not given.      Intravitreal Injection, Pharmacologic Agent - OS - Left Eye       Time Out 01/12/2024. 11:36 AM. Confirmed correct patient, procedure, site, and patient consented.   Anesthesia Topical anesthesia was used. Anesthetic medications included Lidocaine  2%, Proparacaine 0.5%.   Procedure Preparation included 5% betadine to ocular surface, eyelid speculum. A supplied (32g) needle was used.   Injection: 6 mg faricimab -svoa 6 MG/0.05ML   Route: Intravitreal, Site: Left Eye   NDC: 49757-903-93, Lot: [id number], Expiration date: 01/01/2025, Waste: 0 mL   Post-op Post injection exam found visual acuity of at least counting fingers. The patient tolerated the procedure well. There were no complications. The patient received written and verbal post procedure care education. Post injection medications were not given.             ASSESSMENT/PLAN:    ICD-10-CM   1. Exudative age-related macular degeneration of both eyes with active choroidal neovascularization (HCC)  H35.3231 OCT, Retina - OU - Both Eyes    Intravitreal Injection, Pharmacologic Agent - OD - Right Eye    Intravitreal Injection, Pharmacologic Agent - OS - Left Eye    faricimab -svoa (VABYSMO ) 6mg /0.76mL intravitreal injection    aflibercept  (EYLEA ) SOLN 2 mg    2. Subretinal hemorrhage of both eyes  H35.63     3. Serous retinal detachment of left eye   H33.22     4. 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 Essential hypertension  I10     7. Hypertensive retinopathy of both eyes  H35.033     8. Pseudophakia of both eyes  Z96.1      1-3. Exudative age related macular degeneration / PECHR OU - interval conversion to exu ARMD OD noted on 11.18.22 exam -- new SRH and exudates IT periphery  - initially presented with symptomatic floaters OS - initial exam showed large peripheral temporal subretinal hemorrhage OS -- spanning 2-430 -- also mild vitreous opacities and condensations that were causing symptomatic floaters  - B-scan (9.18.20) showed hyperreflective, ill-defined mass, 0300 periphery OS - was seen by Dr. Beatris, Ocular Oncologist, on 9.22.2020 -- no tumor on exam or b-scan  - s/p IVA OS #1 (08.05.20) for subretinal hemorrhage, peripheral CNV - s/p IVA OD #1 (11.18.22), #2 (12.16.22), #3 (01.13.23), #4 (02.10.23), #5 (03.24.23) ========================  - s/p IVE OD #1 (05.08.23), #2 (06.21.23), #3 (08.16.23), #4 (02.27.24), #5 (06.05.24), #6 (10.10.24), #7 (01.08.25), #8 (02.26.25), #9 (06.18.25) #10 (10.31.25) - s/p IVE OS #1 (09.02.20) - sample, #2 (10.12.20), #3 (11.11.20), #4 (12.09.20), #5 (1.20.21), #6 (02.17.21), #7 (03.19.21), #8 (  04.30.21), #9 (06.16.21), #10 (08.11.21), #11 (12.01.21), #12 (01.26.22), #13 (03.30.22), #14 (06.01.22), #15 (08.10.22), #16 (09.23.22), #17 (11.18.22), #18 (02.10.23), #19 (03.24.23), #20 (05.08.23), #21 (06.21.23), #22 (08.16.23), #23 (10.25.23), #24 (12.20.23), #25 (02.27.24) ==========================  - s/p IVV OS #1 (04.24.24), #2 (06.05.24), #3 (07.17.24), #4 (08.28.24), #5 (10.10.24), #6 (11.20.24), #7 (01.08.25), #8 (02.26.25), #9 (04.23.25), #10 (06.18.25) #11 (08.29.25), #12 (10.31.25)  ==========================  **h/o increased IRF OS at 10 weeks on 01.09.26**  **h/o increased IRF OD at 18 weeks on 10.31.25**  - today BCVA OD 20/30  from 20/50, OS 20/150 from 20/80   - exam shows peripheral SRH stably improved OU - OCT shows OD: Interval improvement in central edema/IRF, drusen, +ERM, bullous schisis IT quad--not imaged today; OS: Persistent central cystic changes--increased, persistent ERM with PRF at 10 weeks  - recommend IVE OD #11 and IVV OS #13 today, 01.09.26 w/ f/u decreased to 8 wks  - pt in agreement and wishes to proceed with injection OS  - RBA of procedure discussed, questions answered   - see procedure note  - IVV informed consent obtained and signed 04.24.24 (OS)  - Pt okay for all treatments / meds, payable 100% (Medicare and Tricare) - f/u 8 weeks, sooner prn, OCT -- scan through IT schisis OU, DFE, possible injection(s)  4. Epiretinal membrane, OU.  - mild ERM OU  - asymptomatic, no metamorphopsia  - no indication for surgery at this time  - continue to monitor  5. Retinoschisis OD -- stable - schisis cavity, IT quadrant, from 0700-0830, -- ?less posterior extension ?collapsing  - no associated RT/RD but now with [facility] and exudates -- improving   - discussed findings and prognosis  - continue to monitor  6,7. Hypertensive retinopathy OU  - discussed importance of tight BP control  - continue to monitor  8. Pseudophakia OU  - s/p CE/IOL OU  - doing well  - continue to monitor  Ophthalmic Meds Ordered this visit:  Meds ordered this encounter  Medications   faricimab -svoa (VABYSMO ) 6mg /0.69mL intravitreal injection   aflibercept  (EYLEA ) SOLN 2 mg     Return in about 8 weeks (around 03/08/2024) for exu ARMD OU, DFE, OCT, likely IVE OD/IVV OS.  There are no Patient Instructions on file for this visit.   This document serves as a record of services personally performed by Redell JUDITHANN Hans, MD, PhD. It was created on their behalf by Delon Newness COT, an ophthalmic technician. The creation of this record is the provider's dictation and/or activities during the visit.    Electronically  signed by: Delon Newness COT 01.02.26 8:25 PM  This document serves as a record of services personally performed by Redell JUDITHANN Hans, MD, PhD. It was created on their behalf by Wanda GEANNIE Keens, COT an ophthalmic technician. The creation of this record is the provider's dictation and/or activities during the visit.    Electronically signed by:  Wanda GEANNIE Keens, COT  01/14/2024 8:25 PM  This document serves as a record of services personally performed by Redell JUDITHANN Hans, MD, PhD. It was created on their behalf by Almetta Pesa, an ophthalmic technician. The creation of this record is the provider's dictation and/or activities during the visit.    Electronically signed by: Almetta Pesa, OA, 01/14/2024  8:25 PM  Redell JUDITHANN Hans, M.D., Ph.D. Diseases & Surgery of the Retina and Vitreous [facility] 01/12/2024    I have reviewed the above documentation for accuracy and completeness, and I  agree with the above. Redell JUDITHANN Hans, M.D., Ph.D. 01/14/2024 8:28 PM   Abbreviations: M myopia (nearsighted); A astigmatism; H hyperopia (farsighted); P presbyopia; Mrx spectacle prescription;  CTL contact lenses; OD right eye; OS left eye; OU both eyes  XT exotropia; ET esotropia; PEK punctate epithelial keratitis; PEE punctate epithelial erosions; DES dry eye syndrome; MGD meibomian gland dysfunction; ATs artificial tears; PFAT's preservative free artificial tears; NSC nuclear sclerotic cataract; PSC posterior subcapsular cataract; ERM epi-retinal membrane; PVD posterior vitreous detachment; RD retinal detachment; DM diabetes mellitus; DR diabetic retinopathy; NPDR non-proliferative diabetic retinopathy; PDR proliferative diabetic retinopathy; CSME clinically significant macular edema; DME diabetic macular edema; dbh dot blot hemorrhages; CWS cotton wool spot; POAG primary open angle glaucoma; C/D cup-to-disc ratio; HVF humphrey visual field; GVF goldmann visual field; OCT  optical coherence tomography; IOP intraocular pressure; BRVO Branch retinal vein occlusion; CRVO central retinal vein occlusion; CRAO central retinal artery occlusion; BRAO branch retinal artery occlusion; RT retinal tear; SB scleral buckle; PPV pars plana vitrectomy; VH Vitreous hemorrhage; PRP panretinal laser photocoagulation; IVK intravitreal kenalog; VMT vitreomacular traction; MH Macular hole;  NVD neovascularization of the disc; NVE neovascularization elsewhere; AREDS age related eye disease study; ARMD age related macular degeneration; POAG primary open angle glaucoma; EBMD epithelial/anterior basement membrane dystrophy; ACIOL anterior chamber intraocular lens; IOL intraocular lens; PCIOL posterior chamber intraocular lens; Phaco/IOL phacoemulsification with intraocular lens placement; PRK photorefractive keratectomy; LASIK laser assisted in situ keratomileusis; HTN hypertension; DM diabetes mellitus; COPD chronic obstructive pulmonary disease "

## 2024-01-12 ENCOUNTER — Encounter (INDEPENDENT_AMBULATORY_CARE_PROVIDER_SITE_OTHER): Payer: Self-pay | Admitting: Ophthalmology

## 2024-01-12 ENCOUNTER — Ambulatory Visit (INDEPENDENT_AMBULATORY_CARE_PROVIDER_SITE_OTHER): Admitting: Ophthalmology

## 2024-01-12 DIAGNOSIS — H33101 Unspecified retinoschisis, right eye: Secondary | ICD-10-CM | POA: Diagnosis not present

## 2024-01-12 DIAGNOSIS — H3322 Serous retinal detachment, left eye: Secondary | ICD-10-CM

## 2024-01-12 DIAGNOSIS — Z961 Presence of intraocular lens: Secondary | ICD-10-CM | POA: Diagnosis not present

## 2024-01-12 DIAGNOSIS — H35373 Puckering of macula, bilateral: Secondary | ICD-10-CM | POA: Diagnosis not present

## 2024-01-12 DIAGNOSIS — H35033 Hypertensive retinopathy, bilateral: Secondary | ICD-10-CM | POA: Diagnosis not present

## 2024-01-12 DIAGNOSIS — H353231 Exudative age-related macular degeneration, bilateral, with active choroidal neovascularization: Secondary | ICD-10-CM | POA: Diagnosis not present

## 2024-01-12 DIAGNOSIS — I10 Essential (primary) hypertension: Secondary | ICD-10-CM | POA: Diagnosis not present

## 2024-01-12 DIAGNOSIS — H3563 Retinal hemorrhage, bilateral: Secondary | ICD-10-CM

## 2024-01-14 ENCOUNTER — Encounter (INDEPENDENT_AMBULATORY_CARE_PROVIDER_SITE_OTHER): Payer: Self-pay | Admitting: Ophthalmology

## 2024-01-14 MED ORDER — AFLIBERCEPT 2MG/0.05ML IZ SOLN FOR KALEIDOSCOPE
2.0000 mg | INTRAVITREAL | Status: AC | PRN
  Administered 2024-01-14: 2 mg via INTRAVITREAL

## 2024-01-14 MED ORDER — FARICIMAB-SVOA 6 MG/0.05ML IZ SOSY
6.0000 mg | PREFILLED_SYRINGE | INTRAVITREAL | Status: AC | PRN
  Administered 2024-01-14: 6 mg via INTRAVITREAL

## 2024-03-08 ENCOUNTER — Encounter (INDEPENDENT_AMBULATORY_CARE_PROVIDER_SITE_OTHER): Admitting: Ophthalmology
